# Patient Record
Sex: Female | Born: 1960 | Race: Black or African American | Hispanic: No | Marital: Single | Smoking: Never smoker
Health system: Southern US, Community
[De-identification: ages and names within clinical notes are randomized; demographics above are authoritative.]

## PROBLEM LIST (undated history)

## (undated) DIAGNOSIS — T7840XA Allergy, unspecified, initial encounter: Secondary | ICD-10-CM

## (undated) DIAGNOSIS — L732 Hidradenitis suppurativa: Secondary | ICD-10-CM

## (undated) DIAGNOSIS — R748 Abnormal levels of other serum enzymes: Secondary | ICD-10-CM

## (undated) DIAGNOSIS — C801 Malignant (primary) neoplasm, unspecified: Secondary | ICD-10-CM

## (undated) DIAGNOSIS — M1990 Unspecified osteoarthritis, unspecified site: Secondary | ICD-10-CM

## (undated) DIAGNOSIS — I493 Ventricular premature depolarization: Secondary | ICD-10-CM

## (undated) DIAGNOSIS — K760 Fatty (change of) liver, not elsewhere classified: Secondary | ICD-10-CM

## (undated) DIAGNOSIS — R002 Palpitations: Secondary | ICD-10-CM

## (undated) DIAGNOSIS — E785 Hyperlipidemia, unspecified: Secondary | ICD-10-CM

## (undated) DIAGNOSIS — E119 Type 2 diabetes mellitus without complications: Secondary | ICD-10-CM

## (undated) DIAGNOSIS — I10 Essential (primary) hypertension: Secondary | ICD-10-CM

## (undated) DIAGNOSIS — L0292 Furuncle, unspecified: Secondary | ICD-10-CM

## (undated) DIAGNOSIS — G43119 Migraine with aura, intractable, without status migrainosus: Secondary | ICD-10-CM

## (undated) HISTORY — DX: Ventricular premature depolarization: I49.3

## (undated) HISTORY — DX: Hyperlipidemia, unspecified: E78.5

## (undated) HISTORY — DX: Essential (primary) hypertension: I10

## (undated) HISTORY — DX: Allergy, unspecified, initial encounter: T78.40XA

## (undated) HISTORY — DX: Fatty (change of) liver, not elsewhere classified: K76.0

## (undated) HISTORY — DX: Hidradenitis suppurativa: L73.2

## (undated) HISTORY — DX: Type 2 diabetes mellitus without complications: E11.9

## (undated) HISTORY — DX: Migraine with aura, intractable, without status migrainosus: G43.119

## (undated) HISTORY — DX: Furuncle, unspecified: L02.92

## (undated) HISTORY — DX: Unspecified osteoarthritis, unspecified site: M19.90

## (undated) HISTORY — DX: Abnormal levels of other serum enzymes: R74.8

## (undated) HISTORY — DX: Palpitations: R00.2

## (undated) HISTORY — PX: BREAST CYST ASPIRATION: SHX578

---
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
## 1988-03-07 HISTORY — PX: ECTOPIC PREGNANCY SURGERY: SHX613

## 1997-04-17 ENCOUNTER — Ambulatory Visit (HOSPITAL_COMMUNITY): Admission: RE | Admit: 1997-04-17 | Discharge: 1997-04-17 | Payer: Self-pay | Admitting: Obstetrics and Gynecology

## 1998-03-11 ENCOUNTER — Other Ambulatory Visit: Admission: RE | Admit: 1998-03-11 | Discharge: 1998-03-11 | Payer: Self-pay | Admitting: Obstetrics and Gynecology

## 1999-03-22 ENCOUNTER — Other Ambulatory Visit: Admission: RE | Admit: 1999-03-22 | Discharge: 1999-03-22 | Payer: Self-pay | Admitting: Obstetrics & Gynecology

## 1999-07-12 ENCOUNTER — Encounter: Admission: RE | Admit: 1999-07-12 | Discharge: 1999-07-12 | Payer: Self-pay | Admitting: *Deleted

## 1999-07-12 ENCOUNTER — Encounter: Payer: Self-pay | Admitting: *Deleted

## 2000-04-17 ENCOUNTER — Other Ambulatory Visit: Admission: RE | Admit: 2000-04-17 | Discharge: 2000-04-17 | Payer: Self-pay | Admitting: Obstetrics & Gynecology

## 2000-04-24 ENCOUNTER — Encounter: Payer: Self-pay | Admitting: Obstetrics & Gynecology

## 2000-04-24 ENCOUNTER — Encounter: Admission: RE | Admit: 2000-04-24 | Discharge: 2000-04-24 | Payer: Self-pay | Admitting: Obstetrics & Gynecology

## 2000-12-28 ENCOUNTER — Encounter: Admission: RE | Admit: 2000-12-28 | Discharge: 2000-12-28 | Payer: Self-pay | Admitting: Obstetrics & Gynecology

## 2000-12-28 ENCOUNTER — Encounter: Payer: Self-pay | Admitting: Obstetrics & Gynecology

## 2001-03-23 ENCOUNTER — Other Ambulatory Visit: Admission: RE | Admit: 2001-03-23 | Discharge: 2001-03-23 | Payer: Self-pay | Admitting: Obstetrics & Gynecology

## 2001-05-04 ENCOUNTER — Encounter: Admission: RE | Admit: 2001-05-04 | Discharge: 2001-05-04 | Payer: Self-pay | Admitting: Obstetrics & Gynecology

## 2001-05-04 ENCOUNTER — Encounter: Payer: Self-pay | Admitting: Obstetrics & Gynecology

## 2001-06-01 ENCOUNTER — Encounter: Payer: Self-pay | Admitting: Internal Medicine

## 2001-06-01 ENCOUNTER — Encounter: Admission: RE | Admit: 2001-06-01 | Discharge: 2001-06-01 | Payer: Self-pay | Admitting: Internal Medicine

## 2001-06-04 ENCOUNTER — Encounter: Payer: Self-pay | Admitting: Internal Medicine

## 2001-06-04 ENCOUNTER — Encounter: Admission: RE | Admit: 2001-06-04 | Discharge: 2001-06-04 | Payer: Self-pay | Admitting: Internal Medicine

## 2002-03-29 ENCOUNTER — Other Ambulatory Visit: Admission: RE | Admit: 2002-03-29 | Discharge: 2002-03-29 | Payer: Self-pay | Admitting: Obstetrics & Gynecology

## 2002-04-19 ENCOUNTER — Encounter: Admission: RE | Admit: 2002-04-19 | Discharge: 2002-04-19 | Payer: Self-pay | Admitting: Obstetrics & Gynecology

## 2002-04-19 ENCOUNTER — Encounter: Payer: Self-pay | Admitting: Obstetrics & Gynecology

## 2002-09-26 ENCOUNTER — Encounter: Admission: RE | Admit: 2002-09-26 | Discharge: 2002-09-26 | Payer: Self-pay | Admitting: Internal Medicine

## 2003-04-18 ENCOUNTER — Other Ambulatory Visit: Admission: RE | Admit: 2003-04-18 | Discharge: 2003-04-18 | Payer: Self-pay | Admitting: Obstetrics & Gynecology

## 2004-02-06 ENCOUNTER — Encounter: Admission: RE | Admit: 2004-02-06 | Discharge: 2004-02-06 | Payer: Self-pay | Admitting: Obstetrics & Gynecology

## 2004-05-31 ENCOUNTER — Encounter: Admission: RE | Admit: 2004-05-31 | Discharge: 2004-05-31 | Payer: Self-pay | Admitting: Internal Medicine

## 2004-06-06 ENCOUNTER — Encounter: Admission: RE | Admit: 2004-06-06 | Discharge: 2004-06-06 | Payer: Self-pay | Admitting: Internal Medicine

## 2005-01-21 ENCOUNTER — Encounter: Admission: RE | Admit: 2005-01-21 | Discharge: 2005-03-06 | Payer: Self-pay | Admitting: Internal Medicine

## 2005-02-25 ENCOUNTER — Encounter: Admission: RE | Admit: 2005-02-25 | Discharge: 2005-02-25 | Payer: Self-pay | Admitting: Obstetrics & Gynecology

## 2006-03-10 ENCOUNTER — Encounter: Admission: RE | Admit: 2006-03-10 | Discharge: 2006-03-10 | Payer: Self-pay | Admitting: Obstetrics & Gynecology

## 2007-03-30 ENCOUNTER — Encounter: Admission: RE | Admit: 2007-03-30 | Discharge: 2007-03-30 | Payer: Self-pay | Admitting: Obstetrics & Gynecology

## 2007-04-13 ENCOUNTER — Encounter: Admission: RE | Admit: 2007-04-13 | Discharge: 2007-04-13 | Payer: Self-pay | Admitting: Obstetrics & Gynecology

## 2008-01-28 ENCOUNTER — Encounter: Admission: RE | Admit: 2008-01-28 | Discharge: 2008-01-28 | Payer: Self-pay | Admitting: Obstetrics & Gynecology

## 2008-03-11 ENCOUNTER — Encounter: Admission: RE | Admit: 2008-03-11 | Discharge: 2008-03-11 | Payer: Self-pay | Admitting: Obstetrics and Gynecology

## 2008-05-09 ENCOUNTER — Encounter: Admission: RE | Admit: 2008-05-09 | Discharge: 2008-05-09 | Payer: Self-pay | Admitting: Obstetrics & Gynecology

## 2009-05-15 ENCOUNTER — Encounter: Admission: RE | Admit: 2009-05-15 | Discharge: 2009-05-15 | Payer: Self-pay | Admitting: Obstetrics & Gynecology

## 2009-10-16 ENCOUNTER — Encounter: Admission: RE | Admit: 2009-10-16 | Discharge: 2009-10-16 | Payer: Self-pay | Admitting: Obstetrics & Gynecology

## 2011-05-27 ENCOUNTER — Ambulatory Visit: Payer: Self-pay | Admitting: Gynecology

## 2011-06-10 ENCOUNTER — Ambulatory Visit: Payer: Self-pay | Admitting: Gynecology

## 2011-06-24 ENCOUNTER — Other Ambulatory Visit: Payer: Self-pay | Admitting: Gynecology

## 2011-06-24 ENCOUNTER — Other Ambulatory Visit: Payer: Self-pay | Admitting: *Deleted

## 2011-06-24 DIAGNOSIS — N63 Unspecified lump in unspecified breast: Secondary | ICD-10-CM

## 2011-07-01 ENCOUNTER — Ambulatory Visit: Payer: Self-pay | Admitting: Gynecology

## 2011-07-08 ENCOUNTER — Ambulatory Visit: Payer: Self-pay | Admitting: Gynecology

## 2011-08-26 ENCOUNTER — Encounter: Payer: Self-pay | Admitting: Gastroenterology

## 2011-10-18 ENCOUNTER — Other Ambulatory Visit: Payer: Self-pay | Admitting: Gastroenterology

## 2012-03-07 HISTORY — PX: COLONOSCOPY: SHX5424

## 2012-05-01 ENCOUNTER — Encounter: Payer: Self-pay | Admitting: Gastroenterology

## 2012-06-01 ENCOUNTER — Ambulatory Visit (AMBULATORY_SURGERY_CENTER): Payer: BC Managed Care – PPO | Admitting: *Deleted

## 2012-06-01 ENCOUNTER — Encounter: Payer: Self-pay | Admitting: Gastroenterology

## 2012-06-01 VITALS — Ht 62.0 in | Wt 172.6 lb

## 2012-06-01 DIAGNOSIS — Z1211 Encounter for screening for malignant neoplasm of colon: Secondary | ICD-10-CM

## 2012-06-01 MED ORDER — MOVIPREP 100 G PO SOLR: ORAL | Status: DC

## 2012-06-15 ENCOUNTER — Other Ambulatory Visit: Payer: Self-pay | Admitting: Gastroenterology

## 2012-06-15 ENCOUNTER — Encounter: Payer: Self-pay | Admitting: Gastroenterology

## 2012-06-15 ENCOUNTER — Ambulatory Visit (AMBULATORY_SURGERY_CENTER): Payer: BC Managed Care – PPO | Admitting: Gastroenterology

## 2012-06-15 VITALS — BP 119/74 | HR 70 | Temp 99.2°F | Resp 19 | Ht 62.0 in | Wt 172.0 lb

## 2012-06-15 DIAGNOSIS — Z1211 Encounter for screening for malignant neoplasm of colon: Secondary | ICD-10-CM

## 2012-06-15 DIAGNOSIS — K5730 Diverticulosis of large intestine without perforation or abscess without bleeding: Secondary | ICD-10-CM

## 2012-06-15 MED ORDER — SODIUM CHLORIDE 0.9 % IV SOLN: 500.0000 mL | INTRAVENOUS | Status: DC

## 2012-06-15 NOTE — Progress Notes (Signed)
Report to pacu rn, vss, bbs=clear 

## 2012-06-15 NOTE — Op Note (Signed)
[facility]  [address]. [place], [address]   COLONOSCOPY PROCEDURE REPORT  PATIENT: Vicki, [name]  MR#: [id number] BIRTHDATE: 1960/10/19 , 46  yrs. old GENDER: Female ENDOSCOPIST: Sable Feil, MD, Marval Regal REFERRED BY:  Tommy Medal, M.D. PROCEDURE DATE:  06/15/2012 PROCEDURE:   Colonoscopy, screening ASA CLASS:   Class II INDICATIONS:Average risk patient for colon cancer. MEDICATIONS: propofol (Diprivan) 134m IV  DESCRIPTION OF PROCEDURE:   After the risks and benefits and of the procedure were explained, informed consent was obtained.  A digital rectal exam revealed no abnormalities of the rectum.    The LB [id number] endoscope was introduced through the anus and advanced to the cecum, which was identified by both the appendix and ileocecal valve .  The quality of the prep was excellent, using MoviPrep .  The instrument was then slowly withdrawn as the colon was fully examined.     COLON FINDINGS: Mild diverticulosis was noted in the sigmoid colon. The colon was otherwise normal.  There was no diverticulosis, inflammation, polyps or cancers unless previously stated. Retroflexed views revealed no abnormalities except a prominant papilla in perianal area.     The scope was then withdrawn from the patient and the procedure completed.  COMPLICATIONS: There were no complications. ENDOSCOPIC IMPRESSION: 1.   Mild diverticulosis was noted in the sigmoid colon 2.   The colon was otherwise normal ..no polyps noted.  RECOMMENDATIONS: 1.  High fiber diet 2.  Continue current colorectal screening recommendations for "routine risk" patients with a repeat colonoscopy in 10 years.   REPEAT EXAM:  cc:  _______________________________ eSigned:Sable Feil MD, FBapt[facility]04/01/2013 10:25 AM

## 2012-06-15 NOTE — Progress Notes (Signed)
Patient did not experience any of the following events: a burn prior to discharge; a fall within the facility; wrong site/side/patient/procedure/implant event; or a hospital transfer or hospital admission upon discharge from the facility. (G8907) Patient did not have preoperative order for IV antibiotic SSI prophylaxis. (G8918)  

## 2012-06-15 NOTE — Patient Instructions (Addendum)

## 2012-06-18 ENCOUNTER — Telehealth: Payer: Self-pay | Admitting: *Deleted

## 2012-06-18 ENCOUNTER — Telehealth: Payer: Self-pay | Admitting: Gastroenterology

## 2012-06-18 NOTE — Telephone Encounter (Signed)
Pt. Wanted to know if it would be OK to use desitin on her anal area for irritation/skin tag.  Advised that if she had discomfort she could use, if not no need to use.

## 2012-06-18 NOTE — Telephone Encounter (Signed)
No answer, left message to call if questions or concerns. 

## 2012-06-29 ENCOUNTER — Ambulatory Visit: Payer: Self-pay | Admitting: Podiatry

## 2012-07-06 ENCOUNTER — Ambulatory Visit: Payer: Self-pay | Admitting: Podiatry

## 2012-07-13 ENCOUNTER — Ambulatory Visit: Payer: Self-pay | Admitting: Podiatry

## 2012-08-31 ENCOUNTER — Ambulatory Visit (INDEPENDENT_AMBULATORY_CARE_PROVIDER_SITE_OTHER): Payer: Federal, State, Local not specified - PPO | Admitting: Podiatry

## 2012-08-31 ENCOUNTER — Encounter: Payer: Self-pay | Admitting: Podiatry

## 2012-08-31 VITALS — BP 127/79 | HR 71 | Wt 170.0 lb

## 2012-08-31 DIAGNOSIS — M25571 Pain in right ankle and joints of right foot: Secondary | ICD-10-CM

## 2012-08-31 DIAGNOSIS — M722 Plantar fascial fibromatosis: Secondary | ICD-10-CM | POA: Insufficient documentation

## 2012-08-31 DIAGNOSIS — M25579 Pain in unspecified ankle and joints of unspecified foot: Secondary | ICD-10-CM | POA: Insufficient documentation

## 2012-08-31 NOTE — Progress Notes (Signed)
Subjective: 52 year old NIDDM female presents complaining of a knot on bottom of right foot for about a month. It gets tender at times. She works on her feet and stands about 8-10 hours a day. Blood sugar under control. 113. She has a pair of diabetic shoes with soft insole.    Objective: Right ankle equinus with forefoot abductus. With knee extended and flexed. Palpable knot mid arch plantar fascial band about a dime size. No edema or erythema, no color change noted. All pedal pulses are palpable.   Assessment: Plantar fibroma right mid arch. Pain on right foot. NIDDM.  Plan: Stretch exercise right Achilles tendon. Reviewed types of exercise.  May benefit from a new pair of Orthotics, functional with hard shell. Patient will return for Orthotics.

## 2012-10-05 ENCOUNTER — Ambulatory Visit (INDEPENDENT_AMBULATORY_CARE_PROVIDER_SITE_OTHER): Payer: Federal, State, Local not specified - PPO | Admitting: Podiatry

## 2012-10-05 DIAGNOSIS — M722 Plantar fascial fibromatosis: Secondary | ICD-10-CM

## 2012-10-05 DIAGNOSIS — M25579 Pain in unspecified ankle and joints of unspecified foot: Secondary | ICD-10-CM

## 2012-10-05 NOTE — Progress Notes (Signed)
Came in for Orthotics. Both feet X-ray done and casted for Orthotics.  Offered Cortisone injection. Patient will wait for another time.  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  No change in condition since last visit. Objective:  Right ankle equinus with forefoot abductus. With knee extended and flexed.  Palpable knot mid arch plantar fascial band about a dime size.  No edema or erythema, no color change noted.  All pedal pulses are palpable.  X-rays of both feet done. There was no acute changes noted.  Lateral view reveal significant anterior advancement of Talar head with lowered arch on L>R.   Assessment: Plantar fibroma right mid arch.  Pain on right foot.  NIDDM.   Plan: Stretch exercise right Achilles tendon. Reviewed types of exercise.  X-rays done and both feet are casted for Orthotics.

## 2012-10-16 NOTE — Telephone Encounter (Signed)
See Previous Encounter

## 2012-12-12 ENCOUNTER — Ambulatory Visit: Payer: Federal, State, Local not specified - PPO | Admitting: Podiatry

## 2012-12-14 ENCOUNTER — Ambulatory Visit: Payer: Federal, State, Local not specified - PPO | Admitting: Podiatry

## 2012-12-15 ENCOUNTER — Emergency Department (HOSPITAL_COMMUNITY)
Admission: EM | Admit: 2012-12-15 | Discharge: 2012-12-15 | Disposition: A | Discharge status: Home / Self Care | Payer: BC Managed Care – PPO | Source: Home / Self Care | Attending: Emergency Medicine | Admitting: Emergency Medicine

## 2012-12-15 ENCOUNTER — Encounter (HOSPITAL_COMMUNITY): Payer: Self-pay | Admitting: Emergency Medicine

## 2012-12-15 DIAGNOSIS — H109 Unspecified conjunctivitis: Secondary | ICD-10-CM

## 2012-12-15 MED ORDER — TETRACAINE HCL 0.5 % OP SOLN
OPHTHALMIC | Status: AC
  Filled 2012-12-15: qty 2

## 2012-12-15 MED ORDER — CIPROFLOXACIN HCL 0.3 % OP SOLN: OPHTHALMIC | Status: DC

## 2012-12-15 NOTE — ED Provider Notes (Signed)
Chief Complaint:   Chief Complaint  Patient presents with  . Eye Pain    History of Present Illness:   Vicki [name] is a 52 year old female who has had an 8-9 day history of redness, pain, itching, irritation, discharge, and matting of both eyes. She denies any blurry vision or photophobia. She's had some pain in her nasal area, nasal congestion, scratchy throat, and headache. She denies any fever or chills. No known sick exposures.  Review of Systems:  Other than noted above, the patient denies any of the following symptoms: Systemic:  No fever, chills, sweats, fatigue, or weight loss. Eye:  No redness, eye pain, photophobia, discharge, blurred vision, or diplopia. ENT:  No nasal congestion, rhinorrhea, or sore throat. Lymphatic:  No adenopathy. Skin:  No rash or pruritis.  PMFSH:  Past medical history, family history, social history, meds, and allergies were reviewed.  She is allergic to sulfa. She has high blood pressure and diabetes. She takes Benicar and metformin.  Physical Exam:   Vital signs:  BP 117/71  Pulse 79  Temp(Src) 98.4 F (36.9 C) (Oral)  SpO2 98%  LMP 04/30/2012 General:  Alert and in no distress. Eye:  Her lids appear normal. Conjunctivas were injected bilaterally, left more so than right. There was no discharge or purulent drainage present. Corneas were intact to fluorescein staining, anterior chamber was normal, fundi were benign, PERRLA, full EOMs. ENT:  TMs and canals clear.  Nasal mucosa normal.  No intra-oral lesions, mucous membranes moist, pharynx clear. Neck:  No adenopathy tenderness or mass. Skin:  Clear, warm and dry.  Assessment:  The encounter diagnosis was Conjunctivitis.  Probably bacterial.  Plan:   1.  Meds:  The following meds were prescribed:   Discharge Medication List as of 12/15/2012  3:25 PM    START taking these medications   Details  ciprofloxacin (CILOXAN) 0.3 % ophthalmic solution 1 drop in both eyes every 3 hours while awake,  Normal        2.  Patient Education/Counseling:  The patient was given appropriate handouts, self care instructions, and instructed in symptomatic relief.  Advised warm compresses.  3.  Follow up:  The patient was told to follow up if no better in 3 to 4 days, if becoming worse in any way, and given some red flag symptoms such as any changes in her vision which would prompt immediate return.  Follow up with her eye doctor if no better in 3 or 4 days.      Reuben Likes, MD 12/15/12 [phone]

## 2012-12-15 NOTE — ED Notes (Signed)
C/o left eye drainage and pain.  Denies injury to eye.

## 2012-12-21 ENCOUNTER — Ambulatory Visit: Payer: Federal, State, Local not specified - PPO | Admitting: Podiatry

## 2013-02-15 ENCOUNTER — Other Ambulatory Visit: Payer: Self-pay | Admitting: Internal Medicine

## 2013-02-15 DIAGNOSIS — R7989 Other specified abnormal findings of blood chemistry: Secondary | ICD-10-CM

## 2013-02-22 ENCOUNTER — Ambulatory Visit
Admission: RE | Admit: 2013-02-22 | Discharge: 2013-02-22 | Disposition: A | Discharge status: Home / Self Care | Payer: Federal, State, Local not specified - PPO | Source: Ambulatory Visit | Attending: Internal Medicine | Admitting: Internal Medicine

## 2013-02-22 DIAGNOSIS — R7989 Other specified abnormal findings of blood chemistry: Secondary | ICD-10-CM

## 2013-07-31 ENCOUNTER — Ambulatory Visit (INDEPENDENT_AMBULATORY_CARE_PROVIDER_SITE_OTHER): Payer: Federal, State, Local not specified - PPO | Admitting: Podiatry

## 2013-07-31 ENCOUNTER — Encounter: Payer: Self-pay | Admitting: Podiatry

## 2013-07-31 VITALS — BP 119/80 | HR 75 | Ht 62.0 in | Wt 169.0 lb

## 2013-07-31 DIAGNOSIS — L02619 Cutaneous abscess of unspecified foot: Secondary | ICD-10-CM

## 2013-07-31 DIAGNOSIS — S90829A Blister (nonthermal), unspecified foot, initial encounter: Secondary | ICD-10-CM

## 2013-07-31 DIAGNOSIS — B353 Tinea pedis: Secondary | ICD-10-CM | POA: Insufficient documentation

## 2013-07-31 DIAGNOSIS — L089 Local infection of the skin and subcutaneous tissue, unspecified: Secondary | ICD-10-CM | POA: Insufficient documentation

## 2013-07-31 DIAGNOSIS — S90426A Blister (nonthermal), unspecified lesser toe(s), initial encounter: Secondary | ICD-10-CM

## 2013-07-31 DIAGNOSIS — L03119 Cellulitis of unspecified part of limb: Secondary | ICD-10-CM

## 2013-07-31 DIAGNOSIS — M79609 Pain in unspecified limb: Secondary | ICD-10-CM

## 2013-07-31 MED ORDER — TERBINAFINE HCL 250 MG PO TABS: 250.0000 mg | ORAL_TABLET | Freq: Every day | ORAL | Status: DC

## 2013-07-31 NOTE — Progress Notes (Signed)
Subjective: 53 year old female presents complaining of a mass on left heel that is painful to walk.   Objective: Fluid filled lesion on left heel, 1cm diameter.  Surrounded with dry scaly skin and hard callused skin.   Assessment: Tinea pedis with blister formation left heel.  Plan: Lanced the lesion and fluid drained. Cleansed with Betadine solution and dressing applied. Patient is to soak in Vinegar water daily x 7 days and take Lamisil for 3 weeks.  Return if condition gets worse.

## 2013-07-31 NOTE — Patient Instructions (Signed)
Seen for a lesion on left heel.  Noted of blister from fungal infection. Take prescribed medication and soak daily in diluted Vinegar water for one week. Return if the condition gets worse.

## 2013-08-05 ENCOUNTER — Ambulatory Visit: Payer: Federal, State, Local not specified - PPO | Admitting: Podiatry

## 2013-11-26 ENCOUNTER — Other Ambulatory Visit: Payer: Self-pay | Admitting: Internal Medicine

## 2013-11-26 DIAGNOSIS — K760 Fatty (change of) liver, not elsewhere classified: Secondary | ICD-10-CM

## 2013-12-09 ENCOUNTER — Ambulatory Visit
Admission: RE | Admit: 2013-12-09 | Discharge: 2013-12-09 | Disposition: A | Discharge status: Home / Self Care | Payer: Federal, State, Local not specified - PPO | Source: Ambulatory Visit | Attending: Internal Medicine | Admitting: Internal Medicine

## 2013-12-09 DIAGNOSIS — K760 Fatty (change of) liver, not elsewhere classified: Secondary | ICD-10-CM

## 2014-03-03 ENCOUNTER — Ambulatory Visit: Payer: Federal, State, Local not specified - PPO | Admitting: Internal Medicine

## 2014-03-31 ENCOUNTER — Encounter: Payer: Self-pay | Admitting: Internal Medicine

## 2014-03-31 ENCOUNTER — Ambulatory Visit (INDEPENDENT_AMBULATORY_CARE_PROVIDER_SITE_OTHER): Payer: Federal, State, Local not specified - PPO | Admitting: Internal Medicine

## 2014-03-31 VITALS — BP 122/68 | HR 80 | Temp 98.4°F | Resp 12 | Ht 62.0 in | Wt 170.0 lb

## 2014-03-31 DIAGNOSIS — E118 Type 2 diabetes mellitus with unspecified complications: Secondary | ICD-10-CM | POA: Insufficient documentation

## 2014-03-31 DIAGNOSIS — E119 Type 2 diabetes mellitus without complications: Secondary | ICD-10-CM | POA: Insufficient documentation

## 2014-03-31 DIAGNOSIS — R748 Abnormal levels of other serum enzymes: Secondary | ICD-10-CM | POA: Insufficient documentation

## 2014-03-31 LAB — HEMOGLOBIN A1C: Hgb A1c MFr Bld: 7.4 % — ABNORMAL HIGH (ref 4.6–6.5)

## 2014-03-31 NOTE — Progress Notes (Addendum)
Patient ID: Vicki [name], female   DOB: 12/12/1960, 54 y.o.   MRN: [id number]  HPI: Vicki [name] is a 54 y.o.-year-old female, referred by her PCP, Dr. Minna Antis, for management of DM2, dx in 2005, non-insulin-dependent, controlled, without complications.  Hemoglobin A1c levels were at or close to target in the past: 11/18/2013: 6.4% 08/09/2013: 7.6% 01/01/2013: 6.8% 04/27/2012: 6.6% 12/15/2011: 5.9% 07/22/2011: 6.4%  Pt is on a regimen of: - Metformin ER 500 mg po bid >> now once a day - in last 4 mo She tried Januvia but developed chest pain.  Pt was checking  her sugars once a day, but not recently. - am: <140 No lows. Lowest sugar was 60; ? If she has she has hypoglycemia awareness. Highest sugar was 140  Glucometer: OneTouch Ultra  Pt's meals are: - Breakfast: skips or oatmeal; eggs, grits, toast + saudage - Lunch: hamburger or chicken sandwich + fries + sodas - Dinner: chicken, potatoes and greens - Snacks: 2-3  - no CKD, last BUN/creatinine:  11/18/2013: BUN 12, creatinine 0.9, glucose 103 She is on Benicar 40 mg. - last set of lipids: 11/18/2013: 188/35/66/114 - last eye exam was in 03/2014. No DR.  - no numbness and tingling in her feet. She had a fungal inf.   Last TSH was 0.610 on 11/06/2013. She has a h/o goiter.  Pt has FH of DM in mother.  Patient also has a history of hypertension, elevated transaminases. Last LFTs: 11/21/2013: AST 79, ALT 130. GGT was normal at 46 (0-60). 04/05/2013: AST 59, ALT 81 02/08/2013: AST 81, ALT 100 01/01/2013: AST 88, ALT 123  ROS: Constitutional: + weight gain, + fatigue, + hot flushes, + increased urination Eyes: + blurry vision, no xerophthalmia ENT: + sore throat, no nodules palpated in throat, no dysphagia/odynophagia, no hoarseness, + tinnitus Cardiovascular: no CP/SOB/+ palpitations/leg swelling Respiratory: + cough/no SOB Gastrointestinal: + all: N/V/D/C/heartburn Musculoskeletal: no muscle/joint aches Skin: + rash,  no easy bruising, no itching` Neurological: no tremors/numbness/tingling/dizziness Psychiatric: no depression/anxiety + low libido  Past Medical History  Diagnosis Date  . Diabetes   . Hypertension   . Allergy   . Boil    Past Surgical History  Procedure Laterality Date  . Tonsillectomy  1980  . Ectopic pregnancy surgery  1990   History   Social History  . Marital Status: Single    Spouse Name: N/A    Number of Children: 1   Occupational History  .    Social History Main Topics  . Smoking status: Never Smoker   . Smokeless tobacco: Never Used  . Alcohol Use: 1.8 oz/week    3 Cans of beer per week  . Drug Use: No   Current Outpatient Prescriptions on File Prior to Visit  Medication Sig Dispense Refill  . acetaminophen (TYLENOL) 500 MG tablet Take 500 mg by mouth as needed for pain.    [name] BENICAR HCT 40-25 MG per tablet Take by mouth daily. Half tab daily    . Calcium Carbonate-Simethicone (MAALOX MAX PO) Take by mouth as needed.    . Cetirizine HCl (ZYRTEC PO) Take by mouth as needed.    . cholecalciferol (VITAMIN D) 1000 UNITS tablet Take 2,000 Units by mouth daily.     . metFORMIN (GLUCOPHAGE) 500 MG tablet Take 500 mg by mouth 2 (two) times daily with a meal.    . ONE TOUCH ULTRA TEST test strip     . meloxicam (MOBIC) 7.5 MG tablet Take 7.5 mg by mouth  as needed for pain.    [name] terbinafine (LAMISIL) 250 MG tablet Take 1 tablet (250 mg total) by mouth daily. (Patient not taking: Reported on 03/31/2014) 21 tablet 0  . zolpidem (AMBIEN) 10 MG tablet Take 10 mg by mouth at bedtime as needed for sleep.     No current facility-administered medications on file prior to visit.   Allergies  Allergen Reactions  . Sulfa Antibiotics Itching   Family History  Problem Relation Age of Onset  . Colon cancer Neg Hx    PE: BP 122/68 mmHg  Pulse 80  Temp(Src) 98.4 F (36.9 C) (Oral)  Resp 12  Ht 5\' 2"  (1.575 m)  Wt 170 lb (77.111 kg)  BMI 31.09 kg/m2  SpO2 97%  LMP  04/30/2012 Wt Readings from Last 3 Encounters:  03/31/14 170 lb (77.111 kg)  07/31/13 169 lb (76.658 kg)  08/31/12 170 lb (77.111 kg)   Constitutional: overweight, in NAD Eyes: PERRLA, EOMI, no exophthalmos ENT: moist mucous membranes, slight thyromegaly, no cervical lymphadenopathy Cardiovascular: RRR, No MRG Respiratory: CTA B Gastrointestinal: abdomen soft, NT, ND, BS+ Musculoskeletal: no deformities, strength intact in all 4 Skin: moist, warm, no rashes Neurological: no tremor with outstretched hands, DTR normal in all 4  ASSESSMENT: 1. DM2, non-insulin-dependent, controlled, without complications  2. Transaminitis  PLAN:  1. Patient with long-standing, controlled diabetes, on oral antidiabetic regimen, with good control. However, we do not have a recent HbA1c and she is not checking sugars either for the last 4 months. She also decreased the Metformin to only 1x a day. I advised her to start checking sugars, will check hbA1c now and also, we will check hbA1c today. - We discussed about options for treatment, and I suggested to:  Patient Instructions  Please increase the Metformin XR 500 mg 2x a day.  Please stop at the lab.  Please return in 3 months with your sugar log.   Please cut down: fried foods, sodas, alcohol to improve your liver enzymes.  - Strongly advised her to start checking sugars at different times of the day - check 1x time a day, rotating checks - given sugar log and advised how to fill it and to bring it at next appt  - given foot care handout and explained the principles  - given instructions for hypoglycemia management "15-15 rule"  - advised for yearly eye exams >> she is UTD - Return to clinic in 3 mo with sugar log   2. Transaminitis - she has had chronic increased LFTs - discussed about changing in diet >> stop fried foods, sodas and alcohol  Office Visit on 03/31/2014  Component Date Value Ref Range Status  . Hgb A1c MFr Bld 03/31/2014 7.4*  4.6 - 6.5 % Final   Glycemic Control Guidelines for People with Diabetes:Non Diabetic:  <6%Goal of Therapy: <7%Additional Action Suggested:  >8%   Hemoglobin A1c has increased,  We will increase the metformin back to 500 mg twice a day and after last visit we might need to increase to 1000 mg twice a day. We also discussed about improving her diet, which will greatly help, also.

## 2014-03-31 NOTE — Patient Instructions (Signed)
Please increase the Metformin XR 500 mg 2x a day.  Please stop at the lab.  Please return in 3 months with your sugar log.   Please cut down: fried foods, sodas, alcohol to improve your liver enzymes.  PATIENT INSTRUCTIONS FOR TYPE 2 DIABETES:  **Please join MyChart!** - see attached instructions about how to join if you have not done so already.  DIET AND EXERCISE Diet and exercise is an important part of diabetic treatment.  We recommended aerobic exercise in the form of brisk walking (working between 40-60% of maximal aerobic capacity, similar to brisk walking) for 150 minutes per week (such as 30 minutes five days per week) along with 3 times per week performing 'resistance' training (using various gauge rubber tubes with handles) 5-10 exercises involving the major muscle groups (upper body, lower body and core) performing 10-15 repetitions (or near fatigue) each exercise. Start at half the above goal but build slowly to reach the above goals. If limited by weight, joint pain, or disability, we recommend daily walking in a swimming pool with water up to waist to reduce pressure from joints while allow for adequate exercise.    BLOOD GLUCOSES Monitoring your blood glucoses is important for continued management of your diabetes. Please check your blood glucoses 2-4 times a day: fasting, before meals and at bedtime (you can rotate these measurements - e.g. one day check before the 3 meals, the next day check before 2 of the meals and before bedtime, etc.).   HYPOGLYCEMIA (low blood sugar) Hypoglycemia is usually a reaction to not eating, exercising, or taking too much insulin/ other diabetes drugs.  Symptoms include tremors, sweating, hunger, confusion, headache, etc. Treat IMMEDIATELY with 15 grams of Carbs: . 4 glucose tablets .  cup regular juice/soda . 2 tablespoons raisins . 4 teaspoons sugar . 1 tablespoon honey Recheck blood glucose in 15 mins and repeat above if still  symptomatic/blood glucose <100.  RECOMMENDATIONS TO REDUCE YOUR RISK OF DIABETIC COMPLICATIONS: * Take your prescribed MEDICATION(S) * Follow a DIABETIC diet: Complex carbs, fiber rich foods, (monounsaturated and polyunsaturated) fats * AVOID saturated/trans fats, high fat foods, >2,300 mg salt per day. * EXERCISE at least 5 times a week for 30 minutes or preferably daily.  * DO NOT SMOKE OR DRINK more than 1 drink a day. * Check your FEET every day. Do not wear tightfitting shoes. Contact us if you develop an ulcer * See your EYE doctor once a year or more if needed * Get a FLU shot once a year * Get a PNEUMONIA vaccine once before and once after age 53 years  GOALS:  * Your Hemoglobin A1c of <7%  * fasting sugars need to be <130 * after meals sugars need to be <180 (2h after you start eating) * Your Systolic BP should be 428 or lower  * Your Diastolic BP should be 80 or lower  * Your HDL (Good Cholesterol) should be 40 or higher  * Your LDL (Bad Cholesterol) should be 100 or lower. * Your Triglycerides should be 150 or lower  * Your Urine microalbumin (kidney function) should be <30 * Your Body Mass Index should be 25 or lower    Please consider the following ways to cut down carbs and fat and increase fiber and micronutrients in your diet: - substitute whole grain for white bread or pasta - substitute brown rice for white rice - substitute 90-calorie flat bread pieces for slices of bread when possible - substitute  sweet potatoes or yams for white potatoes - substitute humus for margarine - substitute tofu for cheese when possible - substitute almond or rice milk for regular milk (would not drink soy milk daily due to concern for soy estrogen influence on breast cancer risk) - substitute dark chocolate for other sweets when possible - substitute water - can add lemon or orange slices for taste - for diet sodas (artificial sweeteners will trick your body that you can eat sweets  without getting calories and will lead you to overeating and weight gain in the long run) - do not skip breakfast or other meals (this will slow down the metabolism and will result in more weight gain over time)  - can try smoothies made from fruit and almond/rice milk in am instead of regular breakfast - can also try old-fashioned (not instant) oatmeal made with almond/rice milk in am - order the dressing on the side when eating salad at a restaurant (pour less than half of the dressing on the salad) - eat as little meat as possible - can try juicing, but should not forget that juicing will get rid of the fiber, so would alternate with eating raw veg./fruits or drinking smoothies - use as little oil as possible, even when using olive oil - can dress a salad with a mix of balsamic vinegar and lemon juice, for e.g. - use agave nectar, stevia sugar, or regular sugar rather than artificial sweateners - steam or broil/roast veggies  - snack on veggies/fruit/nuts (unsalted, preferably) when possible, rather than processed foods - reduce or eliminate aspartame in diet (it is in diet sodas, chewing gum, etc) Read the labels!  Try to read Dr. Janene Harvey book: "Program for Reversing Diabetes" for other ideas for healthy eating.

## 2014-03-31 NOTE — Addendum Note (Signed)
Addended by: Philemon Kingdom on: 03/31/2014 05:19 PM   Modules accepted: Level of Service

## 2014-04-03 ENCOUNTER — Encounter: Payer: Self-pay | Admitting: *Deleted

## 2014-07-04 ENCOUNTER — Ambulatory Visit: Payer: Federal, State, Local not specified - PPO | Admitting: Internal Medicine

## 2014-07-04 DIAGNOSIS — Z0289 Encounter for other administrative examinations: Secondary | ICD-10-CM

## 2014-07-07 ENCOUNTER — Ambulatory Visit: Payer: Federal, State, Local not specified - PPO | Admitting: Internal Medicine

## 2014-07-22 ENCOUNTER — Emergency Department (INDEPENDENT_AMBULATORY_CARE_PROVIDER_SITE_OTHER)
Admission: EM | Admit: 2014-07-22 | Discharge: 2014-07-22 | Disposition: A | Discharge status: Home / Self Care | Payer: Federal, State, Local not specified - PPO | Source: Home / Self Care | Attending: Family Medicine | Admitting: Family Medicine

## 2014-07-22 ENCOUNTER — Encounter (HOSPITAL_COMMUNITY): Payer: Self-pay | Admitting: *Deleted

## 2014-07-22 DIAGNOSIS — B009 Herpesviral infection, unspecified: Secondary | ICD-10-CM

## 2014-07-22 MED ORDER — ACYCLOVIR 5 % EX OINT: 1.0000 "application " | TOPICAL_OINTMENT | CUTANEOUS | Status: DC

## 2014-07-22 NOTE — ED Provider Notes (Signed)
CSN: [id number]     Arrival date & time 07/22/14  1717 History   First MD Initiated Contact with Patient 07/22/14 1831     Chief Complaint  Patient presents with  . Mouth Lesions   (Consider location/radiation/quality/duration/timing/severity/associated sxs/prior Treatment) Patient is a 54 y.o. female presenting with mouth sores. The history is provided by the patient.  Mouth Lesions Location:  Upper lip Upper lip location:  R outer Quality:  Blistered and painful Pain details:    Quality:  Dull and sore   Severity:  Mild   Duration:  2 days Onset quality:  Unable to specify Chronicity:  New Context: possible infection and stress   Worsened by:  Nothing tried Ineffective treatments:  None tried Associated symptoms: no fever and no rhinorrhea     Past Medical History  Diagnosis Date  . Diabetes   . Hypertension   . Allergy   . Boil    Past Surgical History  Procedure Laterality Date  . Tonsillectomy  1980  . Ectopic pregnancy surgery  1990   Family History  Problem Relation Age of Onset  . Colon cancer Neg Hx    History  Substance Use Topics  . Smoking status: Never Smoker   . Smokeless tobacco: Never Used  . Alcohol Use: 1.8 oz/week    3 Cans of beer per week   OB History    No data available     Review of Systems  Constitutional: Negative.  Negative for fever.  HENT: Positive for mouth sores. Negative for rhinorrhea.     Allergies  Sulfa antibiotics  Home Medications   Prior to Admission medications   Medication Sig Start Date End Date Taking? Authorizing Provider  acetaminophen (TYLENOL) 500 MG tablet Take 500 mg by mouth as needed for pain.    Historical Provider, MD  acyclovir ointment (ZOVIRAX) 5 % Apply 1 application topically every 3 (three) hours. 07/22/14   Billy Fischer, MD  BENICAR HCT 40-25 MG per tablet Take by mouth daily. Half tab daily 09/02/12   Historical Provider, MD  Calcium Carbonate-Simethicone (MAALOX MAX PO) Take by mouth as  needed.    Historical Provider, MD  Cetirizine HCl (ZYRTEC PO) Take by mouth as needed.    Historical Provider, MD  cholecalciferol (VITAMIN D) 1000 UNITS tablet Take 2,000 Units by mouth daily.     Historical Provider, MD  Garlic 2876 MG CAPS Take 1 capsule by mouth daily.    Historical Provider, MD  meloxicam (MOBIC) 7.5 MG tablet Take 7.5 mg by mouth as needed for pain.    Historical Provider, MD  metFORMIN (GLUCOPHAGE) 500 MG tablet Take 500 mg by mouth 2 (two) times daily with a meal.    Historical Provider, MD  ONE TOUCH ULTRA TEST test strip  05/14/12   Historical Provider, MD  terbinafine (LAMISIL) 250 MG tablet Take 1 tablet (250 mg total) by mouth daily. Patient not taking: Reported on 03/31/2014 07/31/13   Myeong O Sheard, DPM  zolpidem (AMBIEN) 10 MG tablet Take 10 mg by mouth at bedtime as needed for sleep.    Historical Provider, MD   BP 119/67 mmHg  Pulse 75  Temp(Src) 98.8 F (37.1 C) (Oral)  Resp 18  SpO2 99%  LMP 04/30/2012 Physical Exam  Constitutional: She is oriented to person, place, and time. She appears well-developed and well-nourished.  HENT:  Right Ear: External ear normal.  Left Ear: External ear normal.  Mouth/Throat: Oropharynx is clear and moist.  63mm  sl tender smooth lesion to right lower lip, no adenopathy.  Eyes: Conjunctivae are normal. Pupils are equal, round, and reactive to light.  Neck: Normal range of motion. Neck supple.  Neurological: She is alert and oriented to person, place, and time.  Skin: Skin is warm and dry.  Nursing note and vitals reviewed.   ED Course  Procedures (including critical care time) Labs Review Labs Reviewed - No data to display  Imaging Review No results found.   MDM   1. Herpetic lesion       Billy Fischer, MD 07/22/14 [phone]

## 2014-07-22 NOTE — ED Notes (Signed)
C/o bump with a white pimple on upper lip onset Sunday.  C/o tenderness around it.

## 2014-07-25 ENCOUNTER — Encounter: Payer: Self-pay | Admitting: Internal Medicine

## 2014-07-25 ENCOUNTER — Ambulatory Visit (INDEPENDENT_AMBULATORY_CARE_PROVIDER_SITE_OTHER): Payer: Federal, State, Local not specified - PPO | Admitting: Internal Medicine

## 2014-07-25 VITALS — BP 118/80 | HR 83 | Temp 98.0°F | Resp 16 | Wt 169.0 lb

## 2014-07-25 DIAGNOSIS — E119 Type 2 diabetes mellitus without complications: Secondary | ICD-10-CM

## 2014-07-25 DIAGNOSIS — R748 Abnormal levels of other serum enzymes: Secondary | ICD-10-CM

## 2014-07-25 NOTE — Patient Instructions (Addendum)
Please continue Metformin XR 500 mg 2x a day with meals.  Please come back for a follow-up appointment in 3 months with your sugar log.

## 2014-07-25 NOTE — Progress Notes (Signed)
Patient ID: Vicki [name], female   DOB: [birth year], 54 y.o.   MRN: [id number]  HPI: Vicki [name] is a 54 y.o.-year-old female, returning for follow-up for DM2, dx in 2005, non-insulin-dependent, controlled, without complications. Last visit 4 months ago.  Hemoglobin A1c levels were reviewed:  05/2014: HbA1c <7% Lab Results  Component Value Date   HGBA1C 7.4* 03/31/2014  11/18/2013: 6.4% 08/09/2013: 7.6% 01/01/2013: 6.8% 04/27/2012: 6.6% 12/15/2011: 5.9% 07/22/2011: 6.4%  Pt is on a regimen of: - Metformin ER 500 mg po bid - increased at last visit. She tried Januvia but developed chest pain.  Pt was checking  her sugars once a day: - am: <140 >> 88-131, some 140s - before lunch: 111-114 - before dinner: 90-115 - Bedtime: 97-109 - noighttime: 106 No lows. Lowest sugar was 60 >>  88; ? If she has she has hypoglycemia awareness. Highest sugar was 140s  Glucometer: OneTouch Ultra  Pt's meals are: - Breakfast: skips or oatmeal; eggs, grits, toast + sausage - Lunch: hamburger or chicken sandwich + fries + sodas - Dinner: chicken, potatoes and greens - Snacks: 2-3  - no CKD, last BUN/creatinine:  11/18/2013: BUN 12, creatinine 0.9, glucose 103 She is on Benicar 40 mg. - last set of lipids: 11/18/2013: 188/35/66/114 - last eye exam was in 03/2014. No DR.  - no numbness and tingling in her feet. She had a fungal inf.   Will need new records from PCP.  Last TSH was 0.610 on 11/06/2013. She has a h/o goiter.  Patient also has a history of hypertension, elevated transaminases. Last LFTs: 11/21/2013: AST 79, ALT 130. GGT was normal at 46 (0-60). 04/05/2013: AST 59, ALT 81 02/08/2013: AST 81, ALT 100 01/01/2013: AST 88, ALT 123  ROS: Constitutional: no weight gain, + fatigue, + hot flushes, no increased urination Eyes: no blurry vision, no xerophthalmia ENT: + sore throat, no nodules palpated in throat, no dysphagia/odynophagia, no hoarseness Cardiovascular: no CP/SOB/+  palpitations/leg swelling Respiratory: + cough/no SOB Gastrointestinal: no N/V/D/C/heartburn Musculoskeletal: no muscle/joint aches Skin: + rash (cold sore), no easy bruising, no itching Neurological: no tremors/numbness/tingling/dizziness + low libido  I reviewed pt's medications, allergies, PMH, social hx, family hx, and changes were documented in the history of present illness. Otherwise, unchanged from my initial visit note.  Past Medical History  Diagnosis Date  . Diabetes   . Hypertension   . Allergy   . Boil    Past Surgical History  Procedure Laterality Date  . Tonsillectomy  1980  . Ectopic pregnancy surgery  1990   History   Social History  . Marital Status: Single    Spouse Name: N/A    Number of Children: 1   Occupational History  .    Social History Main Topics  . Smoking status: Never Smoker   . Smokeless tobacco: Never Used  . Alcohol Use: 1.8 oz/week    3 Cans of beer per week  . Drug Use: No   Current Outpatient Prescriptions on File Prior to Visit  Medication Sig Dispense Refill  . acetaminophen (TYLENOL) 500 MG tablet Take 500 mg by mouth as needed for pain.    [name] acyclovir ointment (ZOVIRAX) 5 % Apply 1 application topically every 3 (three) hours. 15 g 1  . BENICAR HCT 40-25 MG per tablet Take 0.5 tablets by mouth daily. Half tab daily    . BIOTIN PO Take 1,000 mg by mouth daily.    . Calcium Carbonate-Simethicone (MAALOX MAX PO) Take by mouth as  needed.    . Cetirizine HCl (ZYRTEC PO) Take 10 mg by mouth as needed.     . cholecalciferol (VITAMIN D) 1000 UNITS tablet Take 2,000 Units by mouth daily.     . Garlic 5638 MG CAPS Take 1 capsule by mouth daily.    . Melatonin 3 MG TABS Take 6 mg by mouth at bedtime.    . meloxicam (MOBIC) 7.5 MG tablet Take 7.5 mg by mouth as needed for pain.    . metFORMIN (GLUCOPHAGE) 500 MG tablet Take 500 mg by mouth 2 (two) times daily with a meal.    . Multiple Vitamins-Minerals (MULTIVITAMIN WITH MINERALS)  tablet Take 1 tablet by mouth daily.    . ONE TOUCH ULTRA TEST test strip     . terbinafine (LAMISIL) 250 MG tablet Take 1 tablet (250 mg total) by mouth daily. (Patient not taking: Reported on 03/31/2014) 21 tablet 0  . zolpidem (AMBIEN) 10 MG tablet Take 10 mg by mouth at bedtime as needed for sleep.     No current facility-administered medications on file prior to visit.   Allergies  Allergen Reactions  . Sulfa Antibiotics Itching   Family History  Problem Relation Age of Onset  . Colon cancer Neg Hx   . Hypertension Mother   . Diabetes Mother   . Heart attack Mother    PE: BP 118/80 mmHg  Pulse 83  Temp(Src) 98 F (36.7 C) (Oral)  Resp 16  Wt 169 lb (76.658 kg)  SpO2 98%  LMP 04/30/2012 Body mass index is 30.9 kg/(m^2).  Wt Readings from Last 3 Encounters:  07/25/14 169 lb (76.658 kg)  03/31/14 170 lb (77.111 kg)  07/31/13 169 lb (76.658 kg)   Constitutional: overweight, in NAD Eyes: PERRLA, EOMI, no exophthalmos ENT: moist mucous membranes, slight thyromegaly, no cervical lymphadenopathy Cardiovascular: RRR, No MRG Respiratory: CTA B Gastrointestinal: abdomen soft, NT, ND, BS+ Musculoskeletal: no deformities, strength intact in all 4 Skin: moist, warm, no rashes Neurological: no tremor with outstretched hands, DTR normal in all 4  ASSESSMENT: 1. DM2, non-insulin-dependent, controlled, without complications  2. Transaminitis  PLAN:  1. Patient with long-standing, controlled diabetes, on oral antidiabetic regimen, with good control. At last visit, we increased the Metformin to 2x a day. Her sugars improved after this. Will continue the current regimen - We discussed about options for treatment, and I suggested to:  Patient Instructions  Please continue Metformin XR 500 mg 2x a day with meals.  Please come back for a follow-up appointment in 3 months with your sugar log.  - continue checking sugars at different times of the day - check 1x time a day, rotating  checks - advised for yearly eye exams >> she is UTD - Return to clinic in 3 mo with sugar log  - will need to obtain records from PCP re: latest labs - A1c reportedly <7%  2. Transaminitis - she has had chronic increased LFTs >> will obtain records from PCP to monitor while on metformin  Patient like to establish care with a PCP within [place] - Dr Minna Antis leaving practice. I placed the referral.  We ask for records from PCP during the appointment and I receive them after the visit: Labs were drawn on 06/02/2014:  CMP normal, with a glucose of 93 and GFR of 83.8, BUN/creatinine 15/0.9; AST 41 (0-40), ALT 69 (0-78)  Hemoglobin A1c 6.5%  Lipid panel: 186/51/65/111  These labs are excellent!

## 2014-08-21 ENCOUNTER — Ambulatory Visit: Payer: Federal, State, Local not specified - PPO | Admitting: Internal Medicine

## 2014-08-21 DIAGNOSIS — Z0289 Encounter for other administrative examinations: Secondary | ICD-10-CM

## 2014-09-05 ENCOUNTER — Ambulatory Visit: Payer: Federal, State, Local not specified - PPO | Admitting: Internal Medicine

## 2014-09-10 ENCOUNTER — Ambulatory Visit: Payer: Federal, State, Local not specified - PPO | Admitting: Internal Medicine

## 2014-10-08 ENCOUNTER — Ambulatory Visit (INDEPENDENT_AMBULATORY_CARE_PROVIDER_SITE_OTHER): Payer: Federal, State, Local not specified - PPO | Admitting: Podiatry

## 2014-10-08 ENCOUNTER — Encounter: Payer: Self-pay | Admitting: Podiatry

## 2014-10-08 ENCOUNTER — Ambulatory Visit: Payer: Federal, State, Local not specified - PPO | Admitting: Podiatry

## 2014-10-08 VITALS — BP 121/81 | HR 72 | Ht 62.0 in | Wt 161.0 lb

## 2014-10-08 DIAGNOSIS — S90822A Blister (nonthermal), left foot, initial encounter: Secondary | ICD-10-CM

## 2014-10-08 DIAGNOSIS — L089 Local infection of the skin and subcutaneous tissue, unspecified: Secondary | ICD-10-CM | POA: Diagnosis not present

## 2014-10-08 DIAGNOSIS — B353 Tinea pedis: Secondary | ICD-10-CM

## 2014-10-08 DIAGNOSIS — M722 Plantar fascial fibromatosis: Secondary | ICD-10-CM | POA: Diagnosis not present

## 2014-10-08 MED ORDER — CLOTRIMAZOLE-BETAMETHASONE 1-0.05 % EX CREA
1.0000 | TOPICAL_CREAM | Freq: Two times a day (BID) | CUTANEOUS | Status: DC
Start: 2014-10-08 — End: 2015-04-15

## 2014-10-08 NOTE — Patient Instructions (Signed)
Seen for blistering lesion. Lotrisone cream prescribed. Wound care given. Return as needed.

## 2014-10-08 NOTE — Progress Notes (Signed)
Subjective: 54 year old female presents complaining of palpable mass plantar mid foot area that is painful to walk. Also has a pain in right arch with knot that was present since last year. This is also painful after been on feet for 8-10 hours on concrete floor.  Stated that she has orthotics but has not been wearing.   Objective: Fluid filled lesion on left plantar, 1cm diameter.  Palpable nodule along the plantar fascial band right foot painful.   Assessment: Tinea pedis with blister formation left plantar mid foot. Plantar fibroma right foot painful.   Plan: Lanced the lesion and fluid drained. Cleansed with Betadine solution and dressing applied. Apply Lotrisone cream daily till it gets dry.  Left foot plantar fibroma injection offered. Patient wish to pass at this time. Advised to wear custom orthotics.  Return if condition gets worse.

## 2014-10-09 ENCOUNTER — Telehealth: Payer: Self-pay | Admitting: *Deleted

## 2014-10-31 ENCOUNTER — Ambulatory Visit: Payer: Federal, State, Local not specified - PPO | Admitting: Internal Medicine

## 2015-04-09 ENCOUNTER — Telehealth: Payer: Self-pay | Admitting: Cardiovascular Disease

## 2015-04-09 NOTE — Telephone Encounter (Signed)
Received records from [facility] for appointment on 04/15/15 with Dr Gwenlyn Found.  Records given to [facility] (medical records) for Dr Kennon Holter schedule on 04/15/15. lp

## 2015-04-11 ENCOUNTER — Emergency Department (HOSPITAL_COMMUNITY): Payer: Federal, State, Local not specified - PPO

## 2015-04-11 ENCOUNTER — Emergency Department (HOSPITAL_COMMUNITY)
Admission: EM | Admit: 2015-04-11 | Discharge: 2015-04-11 | Disposition: A | Discharge status: Home / Self Care | Payer: Federal, State, Local not specified - PPO | Attending: Emergency Medicine | Admitting: Emergency Medicine

## 2015-04-11 ENCOUNTER — Encounter (HOSPITAL_COMMUNITY): Payer: Self-pay | Admitting: *Deleted

## 2015-04-11 DIAGNOSIS — Z79899 Other long term (current) drug therapy: Secondary | ICD-10-CM | POA: Insufficient documentation

## 2015-04-11 DIAGNOSIS — Z791 Long term (current) use of non-steroidal anti-inflammatories (NSAID): Secondary | ICD-10-CM | POA: Insufficient documentation

## 2015-04-11 DIAGNOSIS — E119 Type 2 diabetes mellitus without complications: Secondary | ICD-10-CM | POA: Diagnosis not present

## 2015-04-11 DIAGNOSIS — I10 Essential (primary) hypertension: Secondary | ICD-10-CM | POA: Diagnosis not present

## 2015-04-11 DIAGNOSIS — R079 Chest pain, unspecified: Secondary | ICD-10-CM | POA: Diagnosis present

## 2015-04-11 LAB — BASIC METABOLIC PANEL
Anion gap: 14 (ref 5–15)
BUN: 16 mg/dL (ref 6–20)
CO2: 24 mmol/L (ref 22–32)
Calcium: 10.2 mg/dL (ref 8.9–10.3)
Chloride: 100 mmol/L — ABNORMAL LOW (ref 101–111)
Creatinine, Ser: 0.9 mg/dL (ref 0.44–1.00)
GFR calc Af Amer: >60 mL/min (ref >60)
GFR calc non Af Amer: >60 mL/min (ref >60)
Glucose, Bld: 213 mg/dL — ABNORMAL HIGH (ref 65–99)
Potassium: 3.6 mmol/L (ref 3.5–5.1)
Sodium: 138 mmol/L (ref 135–145)

## 2015-04-11 LAB — TROPONIN I: Troponin I: <0.03 ng/mL (ref <0.031)

## 2015-04-11 LAB — CBC
HCT: 39.1 % (ref 36.0–46.0)
Hemoglobin: 13.4 g/dL (ref 12.0–15.0)
MCH: 30.2 pg (ref 26.0–34.0)
MCHC: 34.3 g/dL (ref 30.0–36.0)
MCV: 88.1 fL (ref 78.0–100.0)
Platelets: 165 10*3/uL (ref 150–400)
RBC: 4.44 MIL/uL (ref 3.87–5.11)
RDW: 12.6 % (ref 11.5–15.5)
WBC: 4.9 10*3/uL (ref 4.0–10.5)

## 2015-04-11 LAB — MAGNESIUM: Magnesium: 1.4 mg/dL — ABNORMAL LOW (ref 1.7–2.4)

## 2015-04-11 MED ORDER — POTASSIUM CHLORIDE CRYS ER 20 MEQ PO TBCR
40.0000 meq | EXTENDED_RELEASE_TABLET | Freq: Once | ORAL | Status: AC
  Administered 2015-04-11: 40 meq via ORAL
  Filled 2015-04-11: qty 2

## 2015-04-11 MED ORDER — MAGNESIUM SULFATE 2 GM/50ML IV SOLN
2.0000 g | Freq: Once | INTRAVENOUS | Status: AC
  Administered 2015-04-11: 2 g via INTRAVENOUS
  Filled 2015-04-11: qty 50

## 2015-04-11 NOTE — Discharge Instructions (Signed)
Hypomagnesemia Vicki [name], your potassium and magnesium levels were low today. This may be causing your palpitations. See your primary care physician within 3 days for close follow-up. If any symptoms worsen, come back to the emergency department immediately. Thank you. Hypomagnesemia is a condition in which the level of magnesium in the blood is low. Magnesium is a mineral that is found in many foods. It is used in many different processes in the body. Hypomagnesemia can affect every organ in the body. It can cause life-threatening problems. CAUSES Causes of hypomagnesemia include:  Not getting enough magnesium in your diet.  Malnutrition.  Problems with absorbing magnesium from the intestines.  Dehydration.  Alcohol abuse.  Vomiting.  Severe diarrhea.  Some medicines, including medicines that make you urinate more.  Certain diseases, such as kidney disease, diabetes, and overactive thyroid. SIGNS AND SYMPTOMS  Involuntary shaking or trembling of a body part (tremor).  Confusion.  Muscle weakness.  Sensitivity to light, sound, and touch.  Psychiatric issues, such as depression, irritability, or psychosis.  Sudden tightening of muscles (muscle spasms).  Tingling in the arms and legs.  A feeling of fluttering of the heart. These symptoms are more severe if magnesium levels drop suddenly. DIAGNOSIS To make a diagnosis, your health care provider will do a physical exam and order blood and urine tests. TREATMENT Treatment will depend on the cause and the severity of your condition. It may involve:  A magnesium supplement. This can be taken in pill form. It can also be given through an IV tube. This is usually done if the condition is severe.  Changes to your diet. You may be directed to eat foods that have a lot of magnesium, such as green leafy vegetables, peas, beans, and nuts.  Eliminating alcohol from your diet. HOME CARE INSTRUCTIONS  Include foods with  magnesium in your diet. Foods that are rich in magnesium include green vegetables, beans, nuts and seeds, and whole grains.  Take medicines only as directed by your health care provider.  Take magnesium supplements if your health care provider instructs you to do that. Take them as directed.  Have your magnesium levels monitored as directed by your health care provider.  When you are active, drink fluids that contain electrolytes.  Keep all follow-up visits as directed by your health care provider. This is important. SEEK MEDICAL CARE IF:  You get worse instead of better.  Your symptoms return. SEEK IMMEDIATE MEDICAL CARE IF:  Your symptoms are severe.   This information is not intended to replace advice given to you by your health care provider. Make sure you discuss any questions you have with your health care provider.   Document Released: 11/17/2004 Document Revised: 03/14/2014 Document Reviewed: 10/07/2013 Elsevier Interactive Patient Education [employer].

## 2015-04-11 NOTE — ED Provider Notes (Signed)
CSN: [id number]     Arrival date & time 04/11/15  0038 History   By signing my name below, I, Emmanuella Mensah, attest that this documentation has been prepared under the direction and in the presence of Everlene Balls, MD. Electronically Signed: Judithann Sauger, ED Scribe. 04/11/2015. 3:47 AM.    Chief Complaint  Patient presents with  . Chest Pain   The history is provided by the patient. No language interpreter was used.   HPI Comments: Vicki [name] is a 55 y.o. female with a hx of DM and HTN who presents to the Emergency Department complaining of gradually worsening intermittent episodes of palpitations onset one week ago. She explains that she feels like her "heart is skipping". She reports associated rhinorrhea onset today. She adds that she has been eating and drinking adequately. She states that she went to see her PCP but nothing was done and was informed that her pherotene and liver enzymes were high. She denies a hx of PE/DVT or any heart issues. She also denies any recent surgeries or recent long car/plan rides. She denies any CP, fever, chills, SOB, n/v, cough, leg swelling, numbness, weakness, or dizziness.    Past Medical History  Diagnosis Date  . Diabetes ([place])   . Hypertension   . Allergy   . Boil    Past Surgical History  Procedure Laterality Date  . Tonsillectomy  1980  . Ectopic pregnancy surgery  1990   Family History  Problem Relation Age of Onset  . Colon cancer Neg Hx   . Hypertension Mother   . Diabetes Mother   . Heart attack Mother    Social History  Substance Use Topics  . Smoking status: Never Smoker   . Smokeless tobacco: Never Used  . Alcohol Use: 1.8 oz/week    3 Standard drinks or equivalent per week   OB History    No data available     Review of Systems  Constitutional: Negative for fever and chills.  Respiratory: Negative for cough and shortness of breath.   Cardiovascular: Positive for palpitations. Negative for chest pain and leg  swelling.  Gastrointestinal: Negative for nausea and vomiting.  Neurological: Negative for dizziness, weakness and numbness.  All other systems reviewed and are negative.   A complete 10 system review of systems was obtained and all systems are negative except as noted in the HPI and PMH.    Allergies  Sulfa antibiotics  Home Medications   Prior to Admission medications   Medication Sig Start Date End Date Taking? Authorizing Provider  acetaminophen (TYLENOL) 500 MG tablet Take 500 mg by mouth as needed for pain.    Historical Provider, MD  acyclovir ointment (ZOVIRAX) 5 % Apply 1 application topically every 3 (three) hours. 07/22/14   Billy Fischer, MD  BENICAR HCT 40-25 MG per tablet Take 0.5 tablets by mouth daily. Half tab daily 09/02/12   Historical Provider, MD  BIOTIN PO Take 1,000 mg by mouth daily.    Historical Provider, MD  Calcium Carbonate-Simethicone (MAALOX MAX PO) Take by mouth as needed.    Historical Provider, MD  Cetirizine HCl (ZYRTEC PO) Take 10 mg by mouth as needed.     Historical Provider, MD  cholecalciferol (VITAMIN D) 1000 UNITS tablet Take 2,000 Units by mouth daily.     Historical Provider, MD  clotrimazole-betamethasone (LOTRISONE) cream Apply 1 application topically 2 (two) times daily. 10/08/14   Myeong O Sheard, DPM  Garlic 123XX123 MG CAPS Take 1 capsule  by mouth daily.    Historical Provider, MD  Melatonin 3 MG TABS Take 6 mg by mouth at bedtime.    Historical Provider, MD  meloxicam (MOBIC) 7.5 MG tablet Take 7.5 mg by mouth as needed for pain.    Historical Provider, MD  metFORMIN (GLUCOPHAGE) 500 MG tablet Take 500 mg by mouth 2 (two) times daily with a meal.    Historical Provider, MD  Multiple Vitamins-Minerals (MULTIVITAMIN WITH MINERALS) tablet Take 1 tablet by mouth daily.    Historical Provider, MD  ONE TOUCH ULTRA TEST test strip  05/14/12   Historical Provider, MD  terbinafine (LAMISIL) 250 MG tablet Take 1 tablet (250 mg total) by mouth daily.  07/31/13   Myeong O Sheard, DPM  zolpidem (AMBIEN) 10 MG tablet Take 10 mg by mouth at bedtime as needed for sleep.    Historical Provider, MD   BP 123/77 mmHg  Pulse 95  Temp(Src) 98 F (36.7 C) (Oral)  Resp 16  SpO2 98%  LMP 04/30/2012 Physical Exam  Constitutional: She is oriented to person, place, and time. She appears well-developed and well-nourished. No distress.  HENT:  Head: Normocephalic and atraumatic.  Nose: Nose normal.  Mouth/Throat: Oropharynx is clear and moist. No oropharyngeal exudate.  Eyes: Conjunctivae and EOM are normal. Pupils are equal, round, and reactive to light. No scleral icterus.  Neck: Normal range of motion. Neck supple. No JVD present. No tracheal deviation present. No thyromegaly present.  Cardiovascular: Normal rate, regular rhythm and normal heart sounds.  Exam reveals no gallop and no friction rub.   No murmur heard. Pulmonary/Chest: Effort normal and breath sounds normal. No respiratory distress. She has no wheezes. She exhibits no tenderness.  Abdominal: Soft. Bowel sounds are normal. She exhibits no distension and no mass. There is no tenderness. There is no rebound and no guarding.  Musculoskeletal: Normal range of motion. She exhibits no edema or tenderness.  Lymphadenopathy:    She has no cervical adenopathy.  Neurological: She is alert and oriented to person, place, and time. No cranial nerve deficit. She exhibits normal muscle tone.  Skin: Skin is warm and dry. No rash noted. No erythema. No pallor.  Nursing note and vitals reviewed.   ED Course  Procedures (including critical care time) DIAGNOSTIC STUDIES: Oxygen Saturation is 98% on RA, normal by my interpretation.    COORDINATION OF CARE: 3:24 AM- Pt advised of plan for treatment and pt agrees. Pt informed of EKG results. She will receive a potassium chloride tablet and magnesium sulfate.    Labs Review Labs Reviewed  BASIC METABOLIC PANEL - Abnormal; Notable for the following:     Chloride 100 (*)    Glucose, Bld 213 (*)    All other components within normal limits  MAGNESIUM - Abnormal; Notable for the following:    Magnesium 1.4 (*)    All other components within normal limits  CBC  TROPONIN I    Imaging Review Dg Chest 2 View  04/11/2015  CLINICAL DATA:  Heart palpitations for 3 days EXAM: CHEST  2 VIEW COMPARISON:  None. FINDINGS: Normal heart size and mediastinal contours. No acute infiltrate or edema. No effusion or pneumothorax. No acute osseous findings. IMPRESSION: Negative chest. Electronically Signed   By: Monte Fantasia M.D.   On: 04/11/2015 01:09   Everlene Balls, MD has personally reviewed and evaluated these images and lab results as part of his medical decision-making.   EKG Interpretation   Date/Time:  Saturday April 11 2015 00:45:52 EST Ventricular Rate:  93 PR Interval:  156 QRS Duration: 72 QT Interval:  348 QTC Calculation: 432 R Axis:   -14 Text Interpretation:  Sinus rhythm with occasional Premature ventricular  complexes Cannot rule out Anterior infarct , age undetermined Abnormal ECG  No old tracing to compare Confirmed by Glynn Octave [id number]) on  04/11/2015 2:41:36 AM      MDM   Final diagnoses:  None   Patient presents to emergency department for palpitations. She denies any chest pain. Troponin was ordered by triage and not clinically indicated, this was negative. Electrolytes find that her potassium is 3.6, magnesium 1.4. This may be the cause of her palpitations. She has not had any arrhythmias on the monitor. EKG does not reveal any signs of ischemia. She does have PVCs. She was given magnesium and potassium replacement. She was advised to see her primary care physician within 3 days for close follow-up and possible initiation of electrolyte supplements. She appears well and in no acute distress, vital signs within her normal limits and she is safe for discharge.    I personally performed the services described  in this documentation, which was scribed in my presence. The recorded information has been reviewed and is accurate.     Everlene Balls, MD 04/11/15 [phone]

## 2015-04-11 NOTE — ED Notes (Signed)
The pt is c/o an irregular heart beat for 2-3 days.  She was at a concert tonight and the music was so loud the discomfort increased..  No son no dizziness

## 2015-04-11 NOTE — ED Notes (Signed)
Pt alert, NAD, calm, interactive, resps e/u,speaking in clear complete sentences, skin W&D, no dyspnea noted, (denies: pain, sob, nausea, dizziness or other sx), VSS, denies questions or needs.

## 2015-04-15 ENCOUNTER — Ambulatory Visit (INDEPENDENT_AMBULATORY_CARE_PROVIDER_SITE_OTHER): Payer: Federal, State, Local not specified - PPO

## 2015-04-15 ENCOUNTER — Ambulatory Visit (INDEPENDENT_AMBULATORY_CARE_PROVIDER_SITE_OTHER): Payer: Federal, State, Local not specified - PPO | Admitting: Cardiovascular Disease

## 2015-04-15 ENCOUNTER — Encounter: Payer: Self-pay | Admitting: Cardiovascular Disease

## 2015-04-15 VITALS — BP 140/82 | HR 84 | Ht 62.0 in | Wt 169.5 lb

## 2015-04-15 DIAGNOSIS — R002 Palpitations: Secondary | ICD-10-CM | POA: Insufficient documentation

## 2015-04-15 LAB — T4, FREE: Free T4: 1.2 ng/dL (ref 0.8–1.8)

## 2015-04-15 LAB — TSH: TSH: 1.11 mIU/L

## 2015-04-15 NOTE — Progress Notes (Signed)
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 Maudie Mercury for cardiac evaluation because of new onset palpitations. Factors include treated hypertension and diabetes. Her mother did have a myocardial infarction at age 83. She has never had a heart attack or stroke and denies chest pain or shortness of breath. She is in her perimenopause time window is followed by Dr. Dellis Filbert. She drinks 2 cups of caffeinated beverages a day as well as alcohol on the weekends. She is not under a lot of stress recently.   Current Outpatient Prescriptions  Medication Sig Dispense Refill  . acetaminophen (TYLENOL) 500 MG tablet Take 500 mg by mouth as needed for pain.    [name] BENICAR HCT 40-25 MG per tablet Take 0.5 tablets by mouth daily. Half tab daily    . Calcium Carbonate-Simethicone (MAALOX MAX PO) Take by mouth as needed.    . cholecalciferol (VITAMIN D) 1000 UNITS tablet Take 2,000 Units by mouth daily.     . Garlic 6073 MG CAPS Take 1 capsule by mouth daily.    . meloxicam (MOBIC) 7.5 MG tablet Take 7.5 mg by mouth as needed for pain.    . metFORMIN (GLUCOPHAGE) 500 MG tablet Take 500 mg by mouth 2 (two) times daily with a meal.    . ONE TOUCH ULTRA TEST test strip      No current facility-administered medications for this visit.    Allergies  Allergen Reactions  . Sulfa Antibiotics Itching    Social History   Social History  . Marital Status: Single    Spouse Name: N/A  . Number of Children: N/A  . Years of Education: N/A   Occupational History  . Not on file.   Social History Main Topics  . Smoking status: Never Smoker   . Smokeless tobacco: Never Used  . Alcohol Use: 1.8 oz/week    3 Standard drinks or equivalent per week  . Drug  Use: No  . Sexual Activity: Yes    Birth Control/ Protection: Condom   Other Topics Concern  . Not on file   Social History Narrative     Review of Systems: General: negative for chills, fever, night sweats or weight changes.  Cardiovascular: negative for chest pain, dyspnea on exertion, edema, orthopnea, palpitations, paroxysmal nocturnal dyspnea or shortness of breath Dermatological: negative for rash Respiratory: negative for cough or wheezing Urologic: negative for hematuria Abdominal: negative for nausea, vomiting, diarrhea, bright red blood per rectum, melena, or hematemesis Neurologic: negative for visual changes, syncope, or dizziness All other systems reviewed and are otherwise negative except as noted above.    Blood pressure 140/82, pulse 84, height 5' 2"  (1.575 m), weight 169 lb 8 oz (76.885 kg), last menstrual period 04/30/2012.  General appearance: alert and no distress Neck: no adenopathy, no carotid bruit, no JVD, supple, symmetrical, trachea midline and thyroid not enlarged, symmetric, no tenderness/mass/nodules Lungs: clear to auscultation bilaterally Heart: regular rate and rhythm, S1, S2 normal, no murmur, click, rub or gallop Extremities: extremities normal, atraumatic, no cyanosis or edema  EKG normal sinus rhythm 84 without ST or T-wave changes. I personally reviewed this EKG  ASSESSMENT AND PLAN:   Palpitations Mrs. Feeley was referred to me by Dr. Maudie Mercury for evaluation of  palpitations. Her cardiac risk factors include treated hypertension and diabetes. Her mother did have a myocardial infarction at age 75. She has never had a heart attack or stroke and denies chest pain or shortness of breath. She does drink 2 cups of caffeine beverages a day and admits to going through menopause currently. The palpitations began approximately 2 weeks ago. They occur daily. There is no presyncope. She was here in the emergency room for evaluation recently for evaluation of  palpitations and was noted to have a slightly low potassium and magnesium level which were both repleted. She was noted to have PVCs on telemetry. Her EKG showed no acute changes and her cardiac enzymes were negative. She does drink alcohol on the weekends apparently has elevated liver function tests as well. I discussed her limiting her caffeine intake as well as alcohol. We will get a 2-D echo and a 2 week event monitor and I will see her back after that for further evaluation. I'm also going to get thyroid function tests.      Vicki Harp MD FACP,FACC,FAHA, [facility] 04/15/2015 9:11 AM

## 2015-04-15 NOTE — Assessment & Plan Note (Signed)
Vicki [name] was referred to me by Dr. Maudie Mercury for evaluation of palpitations. Her cardiac risk factors include treated hypertension and diabetes. Her mother did have a myocardial infarction at age 56. She has never had a heart attack or stroke and denies chest pain or shortness of breath. She does drink 2 cups of caffeine beverages a day and admits to going through menopause currently. The palpitations began approximately 2 weeks ago. They occur daily. There is no presyncope. She was here in the emergency room for evaluation recently for evaluation of palpitations and was noted to have a slightly low potassium and magnesium level which were both repleted. She was noted to have PVCs on telemetry. Her EKG showed no acute changes and her cardiac enzymes were negative. She does drink alcohol on the weekends apparently has elevated liver function tests as well. I discussed her limiting her caffeine intake as well as alcohol. We will get a 2-D echo and a 2 week event monitor and I will see her back after that for further evaluation. I'm also going to get thyroid function tests.

## 2015-04-15 NOTE — Patient Instructions (Signed)
Medication Instructions: Dr Gwenlyn Found has made no changes to your current medications.  Labwork: Your physician recommends that you return for lab work TODAY.  Testing/Procedures: 1. Your physician has requested that you have an echocardiogram. Echocardiography is a painless test that uses sound waves to create images of your heart. It provides your doctor with information about the size and shape of your heart and how well your heart's chambers and valves are working. This procedure takes approximately one hour. There are no restrictions for this procedure.  2. Your physician has recommended that you wear an event monitor. Event monitors are medical devices that record the heart's electrical activity. Doctors most often [place] these monitors to diagnose arrhythmias. Arrhythmias are problems with the speed or rhythm of the heartbeat. The monitor is a small, portable device. You can wear one while you do your normal daily activities. This is usually used to diagnose what is causing palpitations/syncope (passing out).  Follow-up: Dr Gwenlyn Found recommends that you schedule a follow-up appointment in 4-6 weeks.  If you need a refill on your cardiac medications before your next appointment, please call your pharmacy.  Your Doctor has ordered you to wear a heart monitor. You will wear this for 14 days.   TIPS -  REMINDERS 1. The sensor is the lanyard that is worn around your neck every day - this is powered by a battery that needs to be changed every day 2. The monitor is the device that allows you to record symptoms - this will need to be charged daily 3. The sensor & monitor need to be within 100 feet of each other at all times 4. The sensor connects to the electrodes (stickers) - these should be changed every 24-48 hours (you do not have to remove them when you bathe, just make sure they are dry when you connect it back to the sensor 5. If you need more supplies (electrodes, batteries), please call the 1-800  # on the back of the pamphlet and [employer] will mail you more supplies 6. If your skin becomes sensitive, please try the sample pack of sensitive skin electrodes (the white packet in your silver box) and call [employer] to have them mail you more of these type of electrodes 7. When you are finish wearing the monitor, please place all supplies back in the silver box, place the silver box in the pre-packaged UPS bag and drop off at [employer] or call them so they can come pick it up   Cardiac Event Monitoring A cardiac event monitor is a small recording device used to help detect abnormal heart rhythms (arrhythmias). The monitor is used to record heart rhythm when noticeable symptoms such as the following occur:  Fast heartbeats (palpitations), such as heart racing or fluttering.  Dizziness.  Fainting or light-headedness.  Unexplained weakness. The monitor is wired to two electrodes placed on your chest. Electrodes are flat, sticky disks that attach to your skin. The monitor can be worn for up to 30 days. You will wear the monitor at all times, except when bathing.  HOW TO USE YOUR CARDIAC EVENT MONITOR A technician will prepare your chest for the electrode placement. The technician will show you how to place the electrodes, how to work the monitor, and how to replace the batteries. Take time to practice using the monitor before you leave the office. Make sure you understand how to send the information from the monitor to your health care provider. This requires a telephone with a landline,  not a cell phone. You need to:  Wear your monitor at all times, except when you are in water:  Do not get the monitor wet.  Take the monitor off when bathing. Do not swim or use a hot tub with it on.  Keep your skin clean. Do not put body lotion or moisturizer on your chest.  Change the electrodes daily or any time they stop sticking to your skin. You might need to use tape to keep them on.  It is possible  that your skin under the electrodes could become irritated. To keep this from happening, try to put the electrodes in slightly different places on your chest. However, they must remain in the area under your left breast and in the upper right section of your chest.  Make sure the monitor is safely clipped to your clothing or in a location close to your body that your health care provider recommends.  Press the button to record when you feel symptoms of heart trouble, such as dizziness, weakness, light-headedness, palpitations, thumping, shortness of breath, unexplained weakness, or a fluttering or racing heart. The monitor is always on and records what happened slightly before you pressed the button, so do not worry about being too late to get good information.  Keep a diary of your activities, such as walking, doing chores, and taking medicine. It is especially important to note what you were doing when you pushed the button to record your symptoms. This will help your health care provider determine what might be contributing to your symptoms. The information stored in your monitor will be reviewed by your health care provider alongside your diary entries.  Send the recorded information as recommended by your health care provider. It is important to understand that it will take some time for your health care provider to process the results.  Change the batteries as recommended by your health care provider. SEEK IMMEDIATE MEDICAL CARE IF:   You have chest pain.  You have extreme difficulty breathing or shortness of breath.  You develop a very fast heartbeat that persists.  You develop dizziness that does not go away.  You faint or constantly feel you are about to faint. Document Released: 12/01/2007 Document Revised: 07/08/2013 Document Reviewed: 08/20/2012 [facility] Patient Information 2015 [place], [place]. This information is not intended to replace advice given to you by your health care  provider. Make sure you discuss any questions you have with your health care provider.

## 2015-04-20 ENCOUNTER — Ambulatory Visit: Payer: Federal, State, Local not specified - PPO | Admitting: Internal Medicine

## 2015-04-24 ENCOUNTER — Other Ambulatory Visit: Payer: Self-pay

## 2015-04-24 ENCOUNTER — Ambulatory Visit (HOSPITAL_COMMUNITY): Payer: Federal, State, Local not specified - PPO | Attending: Cardiology

## 2015-04-24 DIAGNOSIS — R002 Palpitations: Secondary | ICD-10-CM

## 2015-04-28 DIAGNOSIS — R002 Palpitations: Secondary | ICD-10-CM

## 2015-05-01 ENCOUNTER — Telehealth: Payer: Self-pay | Admitting: Cardiovascular Disease

## 2015-05-01 NOTE — Telephone Encounter (Signed)
Advised results of echo normal. No further questions.

## 2015-05-01 NOTE — Telephone Encounter (Signed)
Returning call from yesterday,she does not know who called. She thinks it might be about test results.

## 2015-05-21 ENCOUNTER — Other Ambulatory Visit: Payer: Self-pay | Admitting: Internal Medicine

## 2015-05-21 DIAGNOSIS — R945 Abnormal results of liver function studies: Secondary | ICD-10-CM

## 2015-05-21 DIAGNOSIS — R7989 Other specified abnormal findings of blood chemistry: Secondary | ICD-10-CM

## 2015-06-01 ENCOUNTER — Other Ambulatory Visit: Payer: Federal, State, Local not specified - PPO

## 2015-06-05 ENCOUNTER — Ambulatory Visit
Admission: RE | Admit: 2015-06-05 | Discharge: 2015-06-05 | Disposition: A | Discharge status: Home / Self Care | Payer: Federal, State, Local not specified - PPO | Source: Ambulatory Visit | Attending: Internal Medicine | Admitting: Internal Medicine

## 2015-06-05 DIAGNOSIS — R7989 Other specified abnormal findings of blood chemistry: Secondary | ICD-10-CM

## 2015-06-05 DIAGNOSIS — R945 Abnormal results of liver function studies: Secondary | ICD-10-CM

## 2015-06-10 ENCOUNTER — Ambulatory Visit (INDEPENDENT_AMBULATORY_CARE_PROVIDER_SITE_OTHER): Payer: Federal, State, Local not specified - PPO | Admitting: Cardiovascular Disease

## 2015-06-10 ENCOUNTER — Encounter: Payer: Self-pay | Admitting: Cardiovascular Disease

## 2015-06-10 VITALS — BP 140/78 | HR 68 | Ht 62.0 in | Wt 167.0 lb

## 2015-06-10 DIAGNOSIS — R002 Palpitations: Secondary | ICD-10-CM

## 2015-06-10 NOTE — Patient Instructions (Signed)
Medication Instructions:  Your physician recommends that you continue on your current medications as directed. Please refer to the Current Medication list given to you today.   Labwork: none  Testing/Procedures: none  Follow-Up: Your physician recommends that you schedule a follow-up appointment in: 3 months with Dr. Gwenlyn Found.   Any Other Special Instructions Will Be Listed Below (If Applicable).   Your physician recommends that you decrease/stop you intake of caffeine and alcohol.     If you need a refill on your cardiac medications before your next appointment, please call your pharmacy.

## 2015-06-10 NOTE — Assessment & Plan Note (Signed)
Mrs. Tucciarone returns for follow-up of her outpatient studies. A 2-D echo was normal. An event monitor showed sinus rhythm/sinus tachycardia with frequent PVCs. We've talked about limiting caffeine or alcohol intake. I also discussed the possibility of alertness empiric beta blocker. The patient prefers dietary modification initially. I will see her back in 3 months

## 2015-06-10 NOTE — Progress Notes (Signed)
Vicki [name] returns for follow-up of her outpatient studies. A 2-D echo was normal. An event monitor showed sinus rhythm/sinus tachycardia with frequent PVCs. We've talked about limiting caffeine or alcohol intake. I also discussed the possibility of alertness empiric beta blocker. The patient prefers dietary modification initially. I will see her back in 3 mont

## 2015-06-12 ENCOUNTER — Other Ambulatory Visit: Payer: Federal, State, Local not specified - PPO

## 2015-06-25 ENCOUNTER — Ambulatory Visit: Payer: Federal, State, Local not specified - PPO | Admitting: Gastroenterology

## 2015-06-26 ENCOUNTER — Encounter: Payer: Self-pay | Admitting: Gastroenterology

## 2015-06-26 ENCOUNTER — Ambulatory Visit (INDEPENDENT_AMBULATORY_CARE_PROVIDER_SITE_OTHER): Payer: Federal, State, Local not specified - PPO | Admitting: Gastroenterology

## 2015-06-26 VITALS — BP 128/76 | HR 84 | Ht 61.0 in | Wt 168.2 lb

## 2015-06-26 DIAGNOSIS — R945 Abnormal results of liver function studies: Secondary | ICD-10-CM

## 2015-06-26 DIAGNOSIS — K760 Fatty (change of) liver, not elsewhere classified: Secondary | ICD-10-CM

## 2015-06-26 DIAGNOSIS — R7989 Other specified abnormal findings of blood chemistry: Secondary | ICD-10-CM

## 2015-06-26 NOTE — Patient Instructions (Signed)
We will collect lab records from your primary.  Thank you for choosing [facility]  Dr Wilfrid Lund III

## 2015-06-26 NOTE — Progress Notes (Signed)
[facility] Consult Note:  History: Vicki [name] 06/26/2015  Referring physician: Jani Gravel, MD  Reason for consult/chief complaint: elevated LFT''s   Subjective HPI:  Vicki [name] was seen in consultation for abnormal LFTs. I'm afraid the referral had no lab results with it, but we were able to find a set of some recent labs through another laboratory portal. It seems that her ALT is a been elevated for some time, perhaps as far back as October 2015. Found a report in the [facility] computer of an ultrasound of that time showing increased hepatic echotexture. She had another ultrasound done about 2 months ago showing the same thing. She was very concerned because she was told that her LFTs were getting worse and she might of hemachromatosis. Ferritin was 800 last month, and I know she was sent for genetic testing but I don't have the results today. Her alcohol use is minimal, perhaps just 2 drinks every Saturday night. She has no personal or family history of liver disease. She recalls that her doctor tested her for viral hepatitis.    ROS:  Review of Systems  Constitutional: Negative for appetite change and unexpected weight change.  HENT: Negative for mouth sores and voice change.   Eyes: Negative for pain and redness.  Respiratory: Negative for cough and shortness of breath.   Cardiovascular: Positive for palpitations. Negative for chest pain.  Genitourinary: Negative for dysuria and hematuria.  Musculoskeletal: Negative for myalgias and arthralgias.  Skin: Negative for pallor and rash.  Neurological: Negative for weakness and headaches.  Hematological: Negative for adenopathy.     Past Medical History: Past Medical History  Diagnosis Date  . Diabetes ([place])     type 2  . Hypertension   . Allergy   . Boil   . Palpitations     frequent PVCs on event monitor  . Hyperlipemia   . Elevated liver enzymes   . Fatty liver   . PVC's (premature ventricular contractions)     Vicki [name] had a screening colonoscopy with Dr. Sharlett Iles in April 2014, normal except for mild sigmoid diverticulosis.  Past Surgical History: Past Surgical History  Procedure Laterality Date  . Tonsillectomy  1980  . Ectopic pregnancy surgery  1990  . Colonoscopy  2014     Family History: Family History  Problem Relation Age of Onset  . Colon cancer Neg Hx   . Hypertension Mother   . Diabetes Mother   . Heart attack Mother   . Hypertension Father   . Heart attack Sister 64  . Diabetes Sister   . Hypertension Sister   . Goiter    . Asthma      Social History: Social History   Social History  . Marital Status: Single    Spouse Name: N/A  . Number of Children: N/A  . Years of Education: N/A   Social History Main Topics  . Smoking status: Never Smoker   . Smokeless tobacco: Never Used  . Alcohol Use: 1.8 oz/week    3 Standard drinks or equivalent per week  . Drug Use: No  . Sexual Activity: Yes    Birth Control/ Protection: Condom   Other Topics Concern  . None   Social History Narrative   Epworth Sleepiness Scale = 3 (as of 04/15/2015)    Allergies: Allergies  Allergen Reactions  . Sulfa Antibiotics Itching    Outpatient Meds: Current Outpatient Prescriptions  Medication Sig Dispense Refill  . acetaminophen (TYLENOL) 500 MG tablet Take 500 mg  by mouth as needed for pain.    [name] BENICAR HCT 40-25 MG per tablet Take 0.5 tablets by mouth daily. Half tab daily    . Calcium Carbonate-Simethicone (MAALOX MAX PO) Take by mouth as needed.    . cholecalciferol (VITAMIN D) 1000 UNITS tablet Take 2,000 Units by mouth daily.     . fexofenadine (ALLEGRA) 180 MG tablet Take 180 mg by mouth daily.    . Garlic 3536 MG CAPS Take 1 capsule by mouth daily. OFF AND ON    . metFORMIN (GLUCOPHAGE) 500 MG tablet Take 500 mg by mouth 3 (three) times daily.     . ONE TOUCH ULTRA TEST test strip     . YUVAFEM 10 MCG TABS vaginal tablet Place 10 mcg vaginally 2 (two) times a week.       No current facility-administered medications for this visit.      ___________________________________________________________________ Objective  Exam:  BP 128/76 mmHg  Pulse 84  Ht 5' 1"  (1.549 m)  Wt 168 lb 4 oz (76.318 kg)  BMI 31.81 kg/m2  LMP 04/30/2012   General: this is a(n) Modestly overweight middle-aged African-American woman with good muscle mass, in no acute distress   Eyes: sclera anicteric, no redness  ENT: oral mucosa moist without lesions, no cervical or supraclavicular lymphadenopathy, good dentition  CV: RRR without murmur, S1/S2, no JVD, no peripheral edema  Resp: clear to auscultation bilaterally, normal RR and effort noted  GI: soft, no tenderness, with active bowel sounds. With inspiration, the right hepatic lobe is felt 2 fingerbreadths below the costal margin   Skin; warm and dry, no rash or jaundice noted  Neuro: awake, alert and oriented x 3. Normal gross motor function and fluent speech  Labs:  AST over 300 ALT over 250 alkaline phosphatase and total bilirubin normal Hemoglobin 13.7 Ferritin 807 Total cholesterol 207, LDL 131, hemoglobin A1c 6.8 Radiologic Studies:  Hepatic ultrasounds as noted above  Assessment: Encounter Diagnoses  Name Primary?  . Fatty liver disease, nonalcoholic Yes  . LFT elevation     We will get the results of further lab tests early next week, since her PCPs office is closed right now. 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 Autoimmune hepatitis is less likely.  Plan:  Review further lab tests when they become available early next week. Then plan any further necessary lab testing including autoimmune markers and perhaps repeat LFTs since the most recent ones are about a month old. We discussed the nature of fatty liver and NASH, and therefore the need for good control of  glucose lipids and weight loss. There are currently no available medicines that of been proven to be of benefit in reducing fatty liver or the resultant inflammation.  Thank you for the courtesy of this consult.  Please call me with any questions or concerns.  Nelida Meuse III

## 2015-06-30 ENCOUNTER — Telehealth: Payer: Self-pay | Admitting: Gastroenterology

## 2015-07-01 NOTE — Telephone Encounter (Signed)
Left a voicemail for the pt to inform her that we have received labs from Dr Maudie Mercury. We had to request one more lab result. After getting the Hemochromatosis result Dr Loletha Carrow will give [place] direction from there.

## 2015-07-03 DIAGNOSIS — H10413 Chronic giant papillary conjunctivitis, bilateral: Secondary | ICD-10-CM | POA: Diagnosis not present

## 2015-07-06 NOTE — Telephone Encounter (Signed)
Called and talked to the referral dept. They will fax addition lab requested. Labs were received.

## 2015-07-07 ENCOUNTER — Telehealth: Payer: Self-pay | Admitting: Gastroenterology

## 2015-07-07 NOTE — Telephone Encounter (Signed)
Pt notified and aware we have recieved a copy of all labs. Pt informed that after Dr Loletha Carrow' review we will contact her with his recommendations.

## 2015-07-09 ENCOUNTER — Telehealth: Payer: Self-pay | Admitting: Gastroenterology

## 2015-07-09 NOTE — Telephone Encounter (Signed)
[name],    Please tell Vicki [name] that I have received and reviewed the remainder of the labs from her PCP.  She tested negative for Autoimmune Hepatitis, and the genetic test for hemochromatosis was negative.  It is important to know that a minority of hemochromatosis patients are not picked up on this genetic testing. Even though her iron levels and liver labs are higher than one typically sees with fatty liver (as we discussed in the office), I still think fatty liver is the most likely diagnosis.  Here is my advice:   1. I know she does not have more than a few alcoholic drinks per week, but just while we figure this out, please abstain from all alcohol for the next 4 weeks. 2. In 4 weeks, please check AST,ALT,Alk Phos, Total bilirubin, Iron, TIBC and Ferritin 3.  Please see me in 5-6 weeks and we will review all and decide how to proceed.  Thanks.  - HD

## 2015-07-13 DIAGNOSIS — Z1231 Encounter for screening mammogram for malignant neoplasm of breast: Secondary | ICD-10-CM | POA: Diagnosis not present

## 2015-07-15 ENCOUNTER — Telehealth: Payer: Self-pay | Admitting: Gastroenterology

## 2015-07-15 DIAGNOSIS — H6120 Impacted cerumen, unspecified ear: Secondary | ICD-10-CM | POA: Diagnosis not present

## 2015-07-15 DIAGNOSIS — H6590 Unspecified nonsuppurative otitis media, unspecified ear: Secondary | ICD-10-CM | POA: Diagnosis not present

## 2015-07-15 DIAGNOSIS — J019 Acute sinusitis, unspecified: Secondary | ICD-10-CM | POA: Diagnosis not present

## 2015-07-15 NOTE — Telephone Encounter (Signed)
Left message on machine to call back    Vicki [name],   Please tell Vicki [name] that I have received and reviewed the remainder of the labs from her PCP. She tested negative for Autoimmune Hepatitis, and the genetic test for hemochromatosis was negative. It is important to know that a minority of hemochromatosis patients are not picked up on this genetic testing. Even though her iron levels and liver labs are higher than one typically sees with fatty liver (as we discussed in the office), I still think fatty liver is the most likely diagnosis.  Here is my advice:  1. I know she does not have more than a few alcoholic drinks per week, but just while we figure this out, please abstain from all alcohol for the next 4 weeks. 2. In 4 weeks, please check AST,ALT,Alk Phos, Total bilirubin, Iron, TIBC and Ferritin 3. Please see me in 5-6 weeks and we will review all and decide how to proceed.  Thanks.  - HD

## 2015-07-16 ENCOUNTER — Other Ambulatory Visit: Payer: Self-pay

## 2015-07-16 DIAGNOSIS — R945 Abnormal results of liver function studies: Principal | ICD-10-CM

## 2015-07-16 DIAGNOSIS — R7989 Other specified abnormal findings of blood chemistry: Secondary | ICD-10-CM

## 2015-07-16 NOTE — Telephone Encounter (Signed)
Pt has been notified of the results and recommendations from Dr Loletha Carrow, She will come in in 4 weeks for repeat labs and has a f/u scheduled for 09/01/15 with Dr Loletha Carrow

## 2015-07-31 DIAGNOSIS — Z01419 Encounter for gynecological examination (general) (routine) without abnormal findings: Secondary | ICD-10-CM | POA: Diagnosis not present

## 2015-07-31 DIAGNOSIS — Z1151 Encounter for screening for human papillomavirus (HPV): Secondary | ICD-10-CM | POA: Diagnosis not present

## 2015-07-31 DIAGNOSIS — Z6831 Body mass index (BMI) 31.0-31.9, adult: Secondary | ICD-10-CM | POA: Diagnosis not present

## 2015-07-31 LAB — RESULTS CONSOLE HPV: CHL HPV: NEGATIVE

## 2015-07-31 LAB — HM PAP SMEAR: HM Pap smear: NEGATIVE

## 2015-08-14 ENCOUNTER — Other Ambulatory Visit (INDEPENDENT_AMBULATORY_CARE_PROVIDER_SITE_OTHER): Payer: Federal, State, Local not specified - PPO

## 2015-08-14 DIAGNOSIS — E119 Type 2 diabetes mellitus without complications: Secondary | ICD-10-CM | POA: Diagnosis not present

## 2015-08-14 DIAGNOSIS — R945 Abnormal results of liver function studies: Principal | ICD-10-CM

## 2015-08-14 DIAGNOSIS — R7989 Other specified abnormal findings of blood chemistry: Secondary | ICD-10-CM | POA: Diagnosis not present

## 2015-08-14 DIAGNOSIS — I10 Essential (primary) hypertension: Secondary | ICD-10-CM | POA: Diagnosis not present

## 2015-08-14 LAB — IBC PANEL
Iron: 87 ug/dL (ref 42–145)
Saturation Ratios: 27.1 % (ref 20.0–50.0)
Transferrin: 229 mg/dL (ref 212.0–360.0)

## 2015-08-14 LAB — BILIRUBIN, TOTAL: Total Bilirubin: 0.7 mg/dL (ref 0.2–1.2)

## 2015-08-14 LAB — FERRITIN: Ferritin: 376.5 ng/mL — ABNORMAL HIGH (ref 10.0–291.0)

## 2015-08-14 LAB — ALT: ALT: 166 U/L — ABNORMAL HIGH (ref 0–35)

## 2015-08-14 LAB — ALKALINE PHOSPHATASE: Alkaline Phosphatase: 88 U/L (ref 39–117)

## 2015-08-14 LAB — AST: AST: 135 U/L — ABNORMAL HIGH (ref 0–37)

## 2015-08-20 DIAGNOSIS — E119 Type 2 diabetes mellitus without complications: Secondary | ICD-10-CM | POA: Diagnosis not present

## 2015-08-20 DIAGNOSIS — E7800 Pure hypercholesterolemia, unspecified: Secondary | ICD-10-CM | POA: Diagnosis not present

## 2015-08-20 DIAGNOSIS — I10 Essential (primary) hypertension: Secondary | ICD-10-CM | POA: Diagnosis not present

## 2015-09-01 ENCOUNTER — Ambulatory Visit (INDEPENDENT_AMBULATORY_CARE_PROVIDER_SITE_OTHER): Payer: Federal, State, Local not specified - PPO | Admitting: Gastroenterology

## 2015-09-01 ENCOUNTER — Ambulatory Visit: Payer: Federal, State, Local not specified - PPO | Admitting: Gastroenterology

## 2015-09-01 ENCOUNTER — Encounter: Payer: Self-pay | Admitting: Gastroenterology

## 2015-09-01 VITALS — BP 120/78 | HR 80

## 2015-09-01 DIAGNOSIS — K760 Fatty (change of) liver, not elsewhere classified: Secondary | ICD-10-CM

## 2015-09-01 DIAGNOSIS — R748 Abnormal levels of other serum enzymes: Secondary | ICD-10-CM | POA: Diagnosis not present

## 2015-09-01 NOTE — Progress Notes (Signed)
Vicki [name]  Chief Complaint: Abnormal LFTs  Subjective History:  See last office consult [name] for details. I did receive further labs from her referring physician, the indicated negative lab workup for autoimmune hepatitis, negative hemachromatosis gene testing. Although Vicki [name] only reports 2 alcoholic drinks on weekend nights, I have my nurse advise her to become abstinent from alcohol altogether and then recheck lab work. Vicki [name] tells me today that she did not stop alcohol as advised. She is still bothered by PVCs and the fact that she previously had a low magnesium and potassium that is unexplained. ROS: Cardiovascular:  no chest pain. Positive for palpitations Respiratory: no dyspnea  The patient's Past Medical, Family and Social History were reviewed and are on file in the EMR.  Objective:  Med list reviewed  Vital signs in last 24 hrs: Filed Vitals:   09/01/15 1544  BP: 120/78  Pulse: 80    Physical Exam   HEENT: sclera anicteric, oral mucosa moist without lesions  Neck: supple, no thyromegaly, JVD or lymphadenopathy  Cardiac: RRR without murmurs, S1S2 heard, no peripheral edema  Pulm: clear to auscultation bilaterally, normal RR and effort noted  Abdomen: soft, No tenderness, with active bowel sounds. Her liver edge felt 2 fingerbreadths below the costal margin, similar to her previous exam.  Skin; warm and dry, no jaundice or rash  Recent Labs:  CMP Latest Ref Rng 08/14/2015 04/11/2015  Glucose 65 - 99 mg/dL - 213(H)  BUN 6 - 20 mg/dL - 16  Creatinine 0.44 - 1.00 mg/dL - 0.90  Sodium 135 - 145 mmol/L - 138  Potassium 3.5 - 5.1 mmol/L - 3.6  Chloride 101 - 111 mmol/L - 100(L)  CO2 22 - 32 mmol/L - 24  Calcium 8.9 - 10.3 mg/dL - 10.2  Total Bilirubin 0.2 - 1.2 mg/dL 0.7 -  Alkaline Phos 39 - 117 U/L 88 -  AST 0 - 37 U/L 135(H) -  ALT 0 - 35 U/L 166(H) -    Iron saturation 27% ferritin= 376 (down from over 800 when measured by her referring  physician)    @ASSESSMENTPLANBEGIN @ Assessment: Encounter Diagnoses  Name Primary?  . Elevated liver enzymes Yes  . Fatty liver disease, nonalcoholic    Abnormal LFTs appear to be fatty liver. Even though it is probably nonalcoholic, I suspect she may consume more alcohol than she is reporting. In any case, her LFTs are much improved, and the ferritin is down by more than half from the original level. I think she has steatohepatitis, and the prominent inflammatory response is likely causing the elevated LFTs and ferritin. She does not appear to have hemachromatosis, and I am not currently planning a liver biopsy.  Plan: We will recall her for a recheck of LFTs and iron studies in 3 months. I have again advised her to cut down alcohol use. Over half of the 25 minute encounter was spent in Reviewing outside labs, counseling and coordination of care.   Topics discussed: Causes of elevated LFTs and the contribution of alcohol use.   Vicki [name]  CC:  Andres Ege.D.

## 2015-09-01 NOTE — Patient Instructions (Addendum)
We will plan for you to have labs checked by our clinic in 3 months.  Your physician has requested that you go to the basement for the following lab work 12/02/2015: Hepatic function, IBC, ferritin  If you are age 56 or older, your body mass index should be between 23-30. Your There is no weight on file to calculate BMI. If this is out of the aforementioned range listed, please consider follow up with your Primary Care Provider.  If you are age 27 or younger, your body mass index should be between 19-25. Your There is no weight on file to calculate BMI. If this is out of the aformentioned range listed, please consider follow up with your Primary Care Provider.

## 2015-09-18 ENCOUNTER — Telehealth: Payer: Self-pay | Admitting: Cardiovascular Disease

## 2015-09-18 NOTE — Telephone Encounter (Signed)
New message      The pt wants her magnesium level and potassium level checked when she sees Dr. Gwenlyn Found on July 21 st at 2pm if possible.

## 2015-09-18 NOTE — Telephone Encounter (Signed)
Message routed to Anderson Malta, RN to advise on labs - didn't see where MD had ordered these previously

## 2015-09-18 NOTE — Telephone Encounter (Signed)
Returned call to patient. She said she was in the hospital in February 2017 for low magnesium and potassium and had not had those levels checked since then. She wondered if Dr Gwenlyn Found would do that. I advised patient to call her PCP as they would most likely to be the ones to manage it longterm but she was welcome to ask Dr Gwenlyn Found about it at her upcoming appt. She said she had thought about calling her PCP already and said she would start there.

## 2015-09-25 ENCOUNTER — Encounter: Payer: Self-pay | Admitting: *Deleted

## 2015-09-25 ENCOUNTER — Encounter: Payer: Self-pay | Admitting: Cardiovascular Disease

## 2015-09-25 ENCOUNTER — Ambulatory Visit (INDEPENDENT_AMBULATORY_CARE_PROVIDER_SITE_OTHER): Payer: Federal, State, Local not specified - PPO | Admitting: Cardiovascular Disease

## 2015-09-25 VITALS — BP 100/68 | HR 72 | Ht 62.0 in | Wt 162.0 lb

## 2015-09-25 DIAGNOSIS — R748 Abnormal levels of other serum enzymes: Secondary | ICD-10-CM | POA: Diagnosis not present

## 2015-09-25 DIAGNOSIS — Z79899 Other long term (current) drug therapy: Secondary | ICD-10-CM | POA: Diagnosis not present

## 2015-09-25 DIAGNOSIS — R002 Palpitations: Secondary | ICD-10-CM | POA: Diagnosis not present

## 2015-09-25 NOTE — Progress Notes (Signed)
09/25/2015 Vicki [name]   1960-06-11  [id number]  Primary Physician Vicki Gravel, MD Primary Cardiologist: Lorretta Harp MD Renae Gloss  HPI:  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 Maudie [name] for cardiac evaluation because of new onset palpitations. I last saw her in the office 06/10/15. Factors include treated hypertension and diabetes. Her mother did have a myocardial infarction at age 60. She has never had a heart attack or stroke and denies chest pain or shortness of breath. She is in her perimenopause time window is followed by Dr. Dellis [name]. She drinks 2 cups of caffeinated beverages a day as well as alcohol on the weekends. She is not under a lot of stress recently. A 2-D echocardiogram was normal and an event monitor showed sinus rhythm sinus/sinus tachycardia and frequent PVCs. She has decreased her caffeine intake still gets occasional palpitations.   Current Outpatient Prescriptions  Medication Sig Dispense Refill  . acetaminophen (TYLENOL) 500 MG tablet Take 500 mg by mouth as needed for pain.    [name] BENICAR HCT 40-25 MG per tablet Take 0.5 tablets by mouth daily. Half tab daily    . Calcium Carbonate-Simethicone (MAALOX MAX PO) Take by mouth as needed.    . cholecalciferol (VITAMIN D) 1000 UNITS tablet Take 2,000 Units by mouth daily.     . fexofenadine (ALLEGRA) 180 MG tablet Take 180 mg by mouth daily.    . Garlic 7829 MG CAPS Take 1 capsule by mouth daily. OFF AND ON    . metFORMIN (GLUCOPHAGE) 500 MG tablet Take 500 mg by mouth 3 (three) times daily.     . Omega-3 Fatty Acids (FISH OIL) 1000 MG CAPS Take by mouth.    . ONE TOUCH ULTRA TEST test strip     . Turmeric 500 MG CAPS Take by mouth.     No current facility-administered medications for this visit.    Allergies  Allergen Reactions  . Sulfa Antibiotics Itching    Social History   Social History  .  Marital Status: Single    Spouse Name: N/A  . Number of Children: N/A  . Years of Education: N/A   Occupational History  . Not on file.   Social History Main Topics  . Smoking status: Never Smoker   . Smokeless tobacco: Never Used  . Alcohol Use: 1.8 oz/week    3 Standard drinks or equivalent per week  . Drug Use: No  . Sexual Activity: Yes    Birth Control/ Protection: Condom   Other Topics Concern  . Not on file   Social History Narrative   Epworth Sleepiness Scale = 3 (as of 04/15/2015)     Review of Systems: General: negative for chills, fever, night sweats or weight changes.  Cardiovascular: negative for chest pain, dyspnea on exertion, edema, orthopnea, palpitations, paroxysmal nocturnal dyspnea or shortness of breath Dermatological: negative for rash Respiratory: negative for cough or wheezing Urologic: negative for hematuria Abdominal: negative for nausea, vomiting, diarrhea, bright red blood per rectum, melena, or hematemesis Neurologic: negative for visual changes, syncope, or dizziness All other systems reviewed and are otherwise negative except as noted above.    Blood pressure 100/68, pulse 72, height 5' 2"  (1.575 m), weight 162 lb (73.483 kg), last menstrual period 04/30/2012.  General appearance: alert and no distress Neck: no adenopathy, no carotid bruit, no JVD, supple, symmetrical, trachea midline and  thyroid not enlarged, symmetric, no tenderness/mass/nodules Lungs: clear to auscultation bilaterally Heart: regular rate and rhythm, S1, S2 normal, no murmur, click, rub or gallop Extremities: extremities normal, atraumatic, no cyanosis or edema  EKG normal sinus rhythm at 72 with evidence of septal infarct and low limb voltage with left axis deviation. I personally reviewed this EKG  ASSESSMENT AND PLAN:   Palpitations The patient returns for follow-up of palpitations and PVCs. She has decreased her caffeine intake. She does get occasional PVCs especially  at night. She is not symptomatic enough at this point that she wants to be placed on low-dose beta blocker. We'll check some routine labs on her and I will see her back in 6 months      Lorretta Harp MD [facility], [facility] 09/25/2015 2:48 PM

## 2015-09-25 NOTE — Assessment & Plan Note (Signed)
The patient returns for follow-up of palpitations and PVCs. She has decreased her caffeine intake. She does get occasional PVCs especially at night. She is not symptomatic enough at this point that she wants to be placed on low-dose beta blocker. We'll check some routine labs on her and I will see her back in 6 months

## 2015-09-25 NOTE — Patient Instructions (Signed)
Medication Instructions:  Your physician recommends that you continue on your current medications as directed. Please refer to the Current Medication list given to you today.   Labwork: Your physician recommends that you return for lab work in: [place]. The lab can be found on the FIRST FLOOR of out building in [address]   Testing/Procedures: N/A  Follow-Up: Your physician wants you to follow-up in: [place]. You will receive a reminder letter in the mail two months in advance. If you don't receive a letter, please call our office to schedule the follow-up appointment.  If you need a refill on your cardiac medications before your next appointment, please call your pharmacy.

## 2015-09-26 LAB — BASIC METABOLIC PANEL
BUN: 9 mg/dL (ref 7–25)
CO2: 26 mmol/L (ref 20–31)
Calcium: 9.6 mg/dL (ref 8.6–10.4)
Chloride: 105 mmol/L (ref 98–110)
Creat: 0.7 mg/dL (ref 0.50–1.05)
Glucose, Bld: 99 mg/dL (ref 65–99)
Potassium: 4 mmol/L (ref 3.5–5.3)
Sodium: 141 mmol/L (ref 135–146)

## 2015-09-26 LAB — MAGNESIUM: Magnesium: 1.8 mg/dL (ref 1.5–2.5)

## 2015-09-29 NOTE — Addendum Note (Signed)
Addended by: Cristopher Estimable on: 09/29/2015 04:11 PM   Modules accepted: Orders

## 2015-10-06 ENCOUNTER — Encounter: Payer: Self-pay | Admitting: Podiatry

## 2015-10-06 ENCOUNTER — Ambulatory Visit (INDEPENDENT_AMBULATORY_CARE_PROVIDER_SITE_OTHER): Payer: Federal, State, Local not specified - PPO | Admitting: Podiatry

## 2015-10-06 VITALS — BP 129/76 | HR 71

## 2015-10-06 DIAGNOSIS — H109 Unspecified conjunctivitis: Secondary | ICD-10-CM | POA: Diagnosis not present

## 2015-10-06 DIAGNOSIS — H10502 Unspecified blepharoconjunctivitis, left eye: Secondary | ICD-10-CM | POA: Diagnosis not present

## 2015-10-06 DIAGNOSIS — B353 Tinea pedis: Secondary | ICD-10-CM | POA: Diagnosis not present

## 2015-10-06 DIAGNOSIS — M722 Plantar fascial fibromatosis: Secondary | ICD-10-CM | POA: Diagnosis not present

## 2015-10-06 MED ORDER — TERBINAFINE HCL 250 MG PO TABS: 250.0000 mg | ORAL_TABLET | Freq: Every day | ORAL | 0 refills | Status: DC

## 2015-10-06 NOTE — Progress Notes (Signed)
Subjective: 55 year old female presents complaining of peeling skin in between toes left foot. Also has a palpable mass plantar mid arch right foot with knot that was present since last year. This is also painful after been on feet for 8-10 hours on concrete floor.  Stated that she is using orthotics.  Objective: Palpable nodule along the plantar fascial band right foot painful.  Peeling skin in between digits without open skin left foot.            Assessment: Tinea pedis in between digits left foot. Plantar fibroma right foot painful.   Plan: Left foot plantar fibroma injection offered. Patient wish to pass at this time. Rx Lamisil po x 15 days. Use Vinegar diluted water to soak and wash. May also use Salsun blue shampoo wash.  Continue to wear custom orthotics.  Return if condition gets worse.

## 2015-10-06 NOTE — Patient Instructions (Signed)
Seen for peeling skin lesions in between digits left foot. Antifungal medication prescribed to take one a day for 2 weeks. May soak and wash in diluted Vinegar water. Also cleanse feet after each shower with Salsun blue shampoo.

## 2015-10-13 DIAGNOSIS — K080 Exfoliation of teeth due to systemic causes: Secondary | ICD-10-CM | POA: Diagnosis not present

## 2015-10-19 ENCOUNTER — Ambulatory Visit (INDEPENDENT_AMBULATORY_CARE_PROVIDER_SITE_OTHER): Payer: Federal, State, Local not specified - PPO | Admitting: Sports Medicine

## 2015-10-19 ENCOUNTER — Ambulatory Visit (INDEPENDENT_AMBULATORY_CARE_PROVIDER_SITE_OTHER): Payer: Federal, State, Local not specified - PPO

## 2015-10-19 DIAGNOSIS — M722 Plantar fascial fibromatosis: Secondary | ICD-10-CM

## 2015-10-19 DIAGNOSIS — M79672 Pain in left foot: Secondary | ICD-10-CM

## 2015-10-19 DIAGNOSIS — M79671 Pain in right foot: Secondary | ICD-10-CM | POA: Diagnosis not present

## 2015-10-19 NOTE — Progress Notes (Signed)
   Subjective:    Patient ID: Vicki [name], female    DOB: 03-07-1961, 55 y.o.   MRN: [id number]  HPI    Review of Systems  All other systems reviewed and are negative.      Objective:   Physical Exam        Assessment & Plan:

## 2015-10-19 NOTE — Progress Notes (Signed)
Subjective: Vicki [name] is a 55 y.o.Diabetic female patient presents to office with complaint of heel pain on the right>left and palpable lump in arch on right. Has been seeing Dr. Caffie Pinto with no complete improvement in symptoms. Currently using insoles. Had injection in past that worsened her PVCs and can not take anti-inflammatories. 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 Pain now 2-3/10. Denies any other pedal complaints.   Patient Active Problem List   Diagnosis Date Noted  . Palpitations 04/15/2015  . Controlled type 2 diabetes mellitus without complication ([place]) XX123456  . Elevated liver enzymes 03/31/2014  . Tinea pedis 07/31/2013  . Foot and toe(s), blister, infected 07/31/2013  . 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 Pain in joint, ankle and foot 08/31/2012    Current Outpatient Prescriptions on File Prior to Visit  Medication Sig Dispense Refill  . acetaminophen (TYLENOL) 500 MG tablet Take 500 mg by mouth as needed for pain.    [name] BENICAR HCT 40-25 MG per tablet Take 0.5 tablets by mouth daily. Half tab daily    . Calcium Carbonate-Simethicone (MAALOX MAX PO) Take by mouth as needed.    . cholecalciferol (VITAMIN D) 1000 UNITS tablet Take 2,000 Units by mouth daily.     . fexofenadine (ALLEGRA) 180 MG tablet Take 180 mg by mouth daily.    . Garlic 123XX123 MG CAPS Take 1 capsule by mouth daily. OFF AND ON    . metFORMIN (GLUCOPHAGE) 500 MG tablet Take 500 mg by mouth 3 (three) times daily.     . Omega-3 Fatty Acids (FISH OIL) 1000 MG CAPS Take by mouth.    . ONE TOUCH ULTRA TEST test strip     . terbinafine (LAMISIL) 250 MG tablet Take 1 tablet (250 mg total) by mouth daily. 15 tablet 0  . Turmeric 500 MG CAPS Take by mouth.     No current facility-administered medications on file prior to visit.     Allergies  Allergen Reactions  . Sulfa Antibiotics Itching    Objective: Physical Exam General: The patient is alert and oriented x3 in no  acute distress.  Dermatology: Skin is warm, dry and supple bilateral lower extremities. Nails 1-10 are normal. Focal to mid-arch there is a soft tissue mass in plantar fascia resembling fibroma on right. There is no erythema, edema, no eccymosis, no open lesions present. Integument is otherwise unremarkable.  Vascular: Dorsalis Pedis pulse and Posterior Tibial pulse are 2/4 bilateral. Capillary fill time is immediate to all digits.  Neurological: Grossly intact to light touch with an achilles reflex of +2/5 and a negative Tinel's sign bilateral.  Musculoskeletal: Tenderness to palpation at the medial calcaneal tubercale and through the insertion of the plantar fascia on the right>Left foot especially at fibroma at right arch. No pain with compression of calcaneus bilateral. No pain with tuning fork to calcaneus bilateral. No pain with calf compression bilateral. There is decreased Ankle joint range of motion bilateral. All other joints range of motion within normal limits bilateral. Strength 5/5 in all groups bilateral.   Xray, Right and left foot:  Normal osseous mineralization. Joint spaces preserved. No fracture/dislocation/boney destruction. No Calcaneal spur present with mild thickening of plantar fascia on right. None on left. No other soft tissue abnormalities or radiopaque foreign bodies.   Assessment and Plan: Problem List Items Addressed This Visit      Musculoskeletal and Integument   Plantar fascial fibromatosis - Primary   Relevant Orders   DG  Foot 2 Views Right   DG Foot 2 Views Left    Other Visit Diagnoses    Right foot pain       Left foot pain         -Complete examination performed.  -Xrays reviewed -Discussed with patient in detail the condition of plantar fasciitis with fibroma, how this occurs and general treatment options. Explained both conservative and surgical treatments.  -Patient declined injection due to PVC history -Patient declined oral medication due to  PVC history -Recommended good supportive shoes and advised insoles when she has already. - Explained in detail the use of the fascial brace for right and left which was dispensed at today's visit. -Explained and dispensed to patient daily stretching exercises. -Recommend patient to ice affected area 1-2x daily. -Patient to return to office in 3-4 weeks for follow up or sooner if problems or questions arise.  Landis Martins, DPM

## 2015-10-19 NOTE — Patient Instructions (Signed)
[facility] [employer] [address], [place], [place] [address]   Plantar Fasciitis Plantar fasciitis is a painful foot condition that affects the heel. It occurs when the band of tissue that connects the toes to the heel bone (plantar fascia) becomes irritated. This can happen after exercising too much or doing other repetitive activities (overuse injury). The pain from plantar fasciitis can range from mild irritation to severe pain that makes it difficult for you to walk or move. The pain is usually worse in the morning or after you have been sitting or lying down for a while. CAUSES This condition may be caused by:  Standing for long periods of time.  Wearing shoes that do not fit.  Doing high-impact activities, including running, aerobics, and ballet.  Being overweight.  Having an abnormal way of walking (gait).  Having tight calf muscles.  Having high arches in your feet.  Starting a new athletic activity. SYMPTOMS The main symptom of this condition is heel pain. Other symptoms include:  Pain that gets worse after activity or exercise.  Pain that is worse in the morning or after resting.  Pain that goes away after you walk for a few minutes. DIAGNOSIS This condition may be diagnosed based on your signs and symptoms. Your health care provider will also do a physical exam to check for:  A tender area on the bottom of your foot.  A high arch in your foot.  Pain when you move your foot.  Difficulty moving your foot. You may also need to have imaging studies to confirm the diagnosis. These can include:  X-rays.  Ultrasound.  MRI. TREATMENT  Treatment for plantar fasciitis depends on the severity of the condition. Your treatment may include:  Rest, ice, and over-the-counter pain medicines to manage your pain.  Exercises to stretch your calves and your plantar fascia.  A splint that holds your foot in a stretched, upward position while you sleep (night  splint).  Physical therapy to relieve symptoms and prevent problems in the future.  Cortisone injections to relieve severe pain.  Extracorporeal shock wave therapy (ESWT) to stimulate damaged plantar fascia with electrical impulses. It is often used as a last resort before surgery.  Surgery, if other treatments have not worked after 12 months. HOME CARE INSTRUCTIONS  Take medicines only as directed by your health care provider.  Avoid activities that cause pain.  Roll the bottom of your foot over a bag of ice or a bottle of cold water. Do this for 20 minutes, 3-4 times a day.  Perform simple stretches as directed by your health care provider.  Try wearing athletic shoes with air-sole or gel-sole cushions or soft shoe inserts.  Wear a night splint while sleeping, if directed by your health care provider.  Keep all follow-up appointments with your health care provider. PREVENTION   Do not perform exercises or activities that cause heel pain.  Consider finding low-impact activities if you continue to have problems.  Lose weight if you need to. The best way to prevent plantar fasciitis is to avoid the activities that aggravate your plantar fascia. SEEK MEDICAL CARE IF:  Your symptoms do not go away after treatment with home care measures.  Your pain gets worse.  Your pain affects your ability to move or do your daily activities.   This information is not intended to replace advice given to you by your health care provider. Make sure you discuss any questions you have with your health care  provider.   Document Released: 11/16/2000 Document Revised: 11/12/2014 Document Reviewed: 01/01/2014 Elsevier Interactive Patient Education [employer].

## 2015-10-20 ENCOUNTER — Telehealth: Payer: Self-pay | Admitting: *Deleted

## 2015-10-20 MED ORDER — NONFORMULARY OR COMPOUNDED ITEM: 3 refills | Status: DC

## 2015-10-20 NOTE — Telephone Encounter (Addendum)
-----   Message from Landis Martins, [place] sent at 10/19/2015  2:24 PM EDT ----- Regarding: Topical steroid/pain cream Rx Topical pain/steroid cream from [facility] or [facility] Plantar fasciitis, R>L. -Dr. Cannon Kettle. 10/20/2015-Faxed [facility] Combination Pain Cream order.

## 2015-11-03 DIAGNOSIS — J0100 Acute maxillary sinusitis, unspecified: Secondary | ICD-10-CM | POA: Diagnosis not present

## 2015-11-17 ENCOUNTER — Ambulatory Visit: Payer: Federal, State, Local not specified - PPO | Admitting: Sports Medicine

## 2015-11-19 DIAGNOSIS — J3489 Other specified disorders of nose and nasal sinuses: Secondary | ICD-10-CM | POA: Diagnosis not present

## 2015-11-19 DIAGNOSIS — J309 Allergic rhinitis, unspecified: Secondary | ICD-10-CM | POA: Diagnosis not present

## 2015-11-19 DIAGNOSIS — Z6830 Body mass index (BMI) 30.0-30.9, adult: Secondary | ICD-10-CM | POA: Diagnosis not present

## 2015-11-19 DIAGNOSIS — J0191 Acute recurrent sinusitis, unspecified: Secondary | ICD-10-CM | POA: Diagnosis not present

## 2015-11-23 DIAGNOSIS — E119 Type 2 diabetes mellitus without complications: Secondary | ICD-10-CM | POA: Diagnosis not present

## 2015-11-26 DIAGNOSIS — J0191 Acute recurrent sinusitis, unspecified: Secondary | ICD-10-CM | POA: Diagnosis not present

## 2015-11-26 DIAGNOSIS — J3489 Other specified disorders of nose and nasal sinuses: Secondary | ICD-10-CM | POA: Diagnosis not present

## 2015-11-26 DIAGNOSIS — J341 Cyst and mucocele of nose and nasal sinus: Secondary | ICD-10-CM | POA: Diagnosis not present

## 2015-11-26 DIAGNOSIS — J342 Deviated nasal septum: Secondary | ICD-10-CM | POA: Diagnosis not present

## 2015-12-07 DIAGNOSIS — L818 Other specified disorders of pigmentation: Secondary | ICD-10-CM | POA: Diagnosis not present

## 2015-12-08 ENCOUNTER — Encounter: Payer: Self-pay | Admitting: Sports Medicine

## 2015-12-08 ENCOUNTER — Ambulatory Visit (INDEPENDENT_AMBULATORY_CARE_PROVIDER_SITE_OTHER): Payer: Federal, State, Local not specified - PPO | Admitting: Sports Medicine

## 2015-12-08 DIAGNOSIS — M722 Plantar fascial fibromatosis: Secondary | ICD-10-CM | POA: Diagnosis not present

## 2015-12-08 DIAGNOSIS — M79671 Pain in right foot: Secondary | ICD-10-CM | POA: Diagnosis not present

## 2015-12-08 DIAGNOSIS — M79672 Pain in left foot: Secondary | ICD-10-CM

## 2015-12-08 NOTE — Progress Notes (Signed)
Subjective: Vicki [name] is a 55 y.o.Diabetic female patient returns to office follow up  heel pain on the right>left and palpable lump in arch on right. Patient states that better with plantar fascial braces and states that she got new shoes from [facility] which feel heavy and hard to wear. Denies any other pedal complaints.   Patient Active Problem List   Diagnosis Date Noted  . Palpitations 04/15/2015  . Controlled type 2 diabetes mellitus without complication ([place]) XX123456  . Elevated liver enzymes 03/31/2014  . Tinea pedis 07/31/2013  . Foot and toe(s), blister, infected 07/31/2013  . 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 Pain in joint, ankle and foot 08/31/2012    Current Outpatient Prescriptions on File Prior to Visit  Medication Sig Dispense Refill  . acetaminophen (TYLENOL) 500 MG tablet Take 500 mg by mouth as needed for pain.    [name] BENICAR HCT 40-25 MG per tablet Take 0.5 tablets by mouth daily. Half tab daily    . Calcium Carbonate-Simethicone (MAALOX MAX PO) Take by mouth as needed.    . cholecalciferol (VITAMIN D) 1000 UNITS tablet Take 2,000 Units by mouth daily.     . fexofenadine (ALLEGRA) 180 MG tablet Take 180 mg by mouth daily.    . Garlic 123XX123 MG CAPS Take 1 capsule by mouth daily. OFF AND ON    . metFORMIN (GLUCOPHAGE) 500 MG tablet Take 500 mg by mouth 3 (three) times daily.     . NONFORMULARY OR COMPOUNDED ITEM [facility]:  Combination Pain Cream - Baclofen 2%, Doxepin 5%, Gabapentin 6%, Topora,ate 2%, Pentoxifylline 3%, apply 1-2 grams to affected area 3-4 times daily. 120 each 3  . Omega-3 Fatty Acids (FISH OIL) 1000 MG CAPS Take by mouth.    . ONE TOUCH ULTRA TEST test strip     . terbinafine (LAMISIL) 250 MG tablet Take 1 tablet (250 mg total) by mouth daily. 15 tablet 0  . Turmeric 500 MG CAPS Take by mouth.     No current facility-administered medications on file prior to visit.     Allergies  Allergen Reactions  . Sulfa Antibiotics  Itching  . Sulfasalazine Itching    Objective: Physical Exam General: The patient is alert and oriented x3 in no acute distress.  Dermatology: Skin is warm, dry and supple bilateral lower extremities. Nails 1-10 are normal. Focal to mid-arch there is a soft tissue mass in plantar fascia resembling fibroma on right. There is no erythema, edema, no eccymosis, no open lesions present. Integument is otherwise unremarkable.  Vascular: Dorsalis Pedis pulse and Posterior Tibial pulse are 2/4 bilateral. Capillary fill time is immediate to all digits.  Neurological: Grossly intact to light touch with an achilles reflex of +2/5 and a negative Tinel's sign bilateral.  Musculoskeletal: Mild tenderness to palpation at the medial calcaneal tubercale and through the insertion of the plantar fascia on the right>Left foot especially at fibroma at right arch. No pain with compression of calcaneus bilateral. No pain with tuning fork to calcaneus bilateral. No pain with calf compression bilateral. There is decreased Ankle joint range of motion bilateral. All other joints range of motion within normal limits bilateral. Strength 5/5 in all groups bilateral.   Assessment and Plan: Problem List Items Addressed This Visit      Musculoskeletal and Integument   Plantar fascial fibromatosis - Primary    Other Visit Diagnoses    Right foot pain       Left foot pain         -  Complete examination performed.  -Discussed with patient in detail the condition of plantar fasciitis with fibroma, how this occurs and general treatment options. Explained both conservative and surgical treatments.  -Patient continues to decline injection due to PVC history -Patient continues to decline oral medication due to PVC history -Recommended good supportive shoes and advised insoles when she has already. Encourage patient to return shoes to [facility] if they are uncomfortable - Continue with fascial braces -Rx PT with treatment  modalities and cont daily stretching exercises. -Recommend cont to ice affected area 1-2x daily. -Patient to return to office in 4-6 weeks for follow up after starting PT or sooner if problems or questions arise.  Landis Martins, DPM

## 2015-12-09 ENCOUNTER — Encounter (HOSPITAL_COMMUNITY): Payer: Self-pay | Admitting: Emergency Medicine

## 2015-12-09 ENCOUNTER — Ambulatory Visit (HOSPITAL_COMMUNITY)
Admission: EM | Admit: 2015-12-09 | Discharge: 2015-12-09 | Disposition: A | Discharge status: Home / Self Care | Payer: Federal, State, Local not specified - PPO | Attending: Family Medicine | Admitting: Family Medicine

## 2015-12-09 ENCOUNTER — Telehealth: Payer: Self-pay | Admitting: *Deleted

## 2015-12-09 DIAGNOSIS — M722 Plantar fascial fibromatosis: Secondary | ICD-10-CM

## 2015-12-09 DIAGNOSIS — W57XXXA Bitten or stung by nonvenomous insect and other nonvenomous arthropods, initial encounter: Secondary | ICD-10-CM

## 2015-12-09 DIAGNOSIS — L03115 Cellulitis of right lower limb: Secondary | ICD-10-CM

## 2015-12-09 MED ORDER — CLINDAMYCIN HCL 300 MG PO CAPS: 300.0000 mg | ORAL_CAPSULE | Freq: Three times a day (TID) | ORAL | 0 refills | Status: AC

## 2015-12-09 NOTE — Telephone Encounter (Signed)
Entered in error

## 2015-12-09 NOTE — ED Triage Notes (Signed)
Patient noticed large, red, painful, itchy bite on leg yesterday.  Bump located on right lower leg.  Today noticed same type of bump on left upper arm.

## 2015-12-09 NOTE — Discharge Instructions (Signed)
Apply hot compresses, or soak in epsom salt water.   If not getting better, may require incision and drainage of one or both lesions.

## 2015-12-09 NOTE — ED Provider Notes (Signed)
CSN: [id number]     Arrival date & time 12/09/15  1522 History   First MD Initiated Contact with Patient 12/09/15 1647     Chief Complaint  Patient presents with  . Insect Bite   (Consider location/radiation/quality/duration/timing/severity/associated sxs/prior Treatment) HPI This is a new problem. Patient is a 55 year old female who states that she awoke from sleep this morning to find an insect bite to her right calf and her left upper arm she states that the bite on the right cath is very sore swollen and warm to touch. She states that she has had a previous insect bite of this nature on the left hip a couple years ago. She does not recall with the treatment was for that when she does have a history of diabetes. She states her pain score is about 4 at this time and there is a family history of hypertension and diabetes. Home treatment has been nothing. Past Medical History:  Diagnosis Date  . Allergy   . Boil   . Diabetes ([place])    type 2  . Elevated liver enzymes   . Fatty liver   . Hyperlipemia   . Hypertension   . 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 PVC's (premature ventricular contractions)    Past Surgical History:  Procedure Laterality Date  . COLONOSCOPY  2014  . ECTOPIC PREGNANCY SURGERY  1990  . TONSILLECTOMY  1980   Family History  Problem Relation Age of Onset  . Hypertension Mother   . Diabetes Mother   . Heart attack Mother   . Hypertension Father   . Heart attack Sister 30  . Diabetes Sister   . Hypertension Sister   . Goiter    . Asthma    . Colon cancer Neg Hx    Social History  Substance Use Topics  . Smoking status: Never Smoker  . Smokeless tobacco: Never Used  . Alcohol use 1.8 oz/week    3 Standard drinks or equivalent per week   OB History    No data available     Review of Systems  Denies: HEADACHE, NAUSEA, ABDOMINAL PAIN, CHEST PAIN, CONGESTION, DYSURIA, SHORTNESS OF BREATH  Allergies  Sulfa antibiotics and  Sulfasalazine  Home Medications   Prior to Admission medications   Medication Sig Start Date End Date Taking? Authorizing Provider  acetaminophen (TYLENOL) 500 MG tablet Take 500 mg by mouth as needed for pain.    Historical Provider, MD  BENICAR HCT 40-25 MG per tablet Take 0.5 tablets by mouth daily. Half tab daily 09/02/12   Historical Provider, MD  Calcium Carbonate-Simethicone (MAALOX MAX PO) Take by mouth as needed.    Historical Provider, MD  cholecalciferol (VITAMIN D) 1000 UNITS tablet Take 2,000 Units by mouth daily.     Historical Provider, MD  clindamycin (CLEOCIN) 300 MG capsule Take 1 capsule (300 mg total) by mouth 3 (three) times daily. 12/09/15 12/16/15  Konrad Felix, PA  fexofenadine (ALLEGRA) 180 MG tablet Take 180 mg by mouth daily.    Historical Provider, MD  Garlic 7035 MG CAPS Take 1 capsule by mouth daily. OFF AND ON    Historical Provider, MD  metFORMIN (GLUCOPHAGE) 500 MG tablet Take 500 mg by mouth 3 (three) times daily.     Historical Provider, MD  NONFORMULARY OR COMPOUNDED [place]:  Combination Pain Cream - Baclofen 2%, Doxepin 5%, Gabapentin 6%, Topora,ate 2%, Pentoxifylline 3%, apply 1-2 grams to affected area 3-4 times daily.  10/20/15   Landis Martins, DPM  Omega-3 Fatty Acids (FISH OIL) 1000 MG CAPS Take by mouth.    Historical Provider, MD  ONE TOUCH ULTRA TEST test strip  05/14/12   Historical Provider, MD  terbinafine (LAMISIL) 250 MG tablet Take 1 tablet (250 mg total) by mouth daily. 10/06/15   Myeong O Sheard, DPM  Turmeric 500 MG CAPS Take by mouth.    Historical Provider, MD   Meds Ordered and Administered this Visit  Medications - No data to display  BP 122/73 (BP Location: Left Arm)   Pulse 95   Temp 97.8 F (36.6 C) (Oral)   Resp 18   LMP 04/30/2012   SpO2 99%  No data found.   Physical Exam NURSES NOTES AND VITAL SIGNS REVIEWED. CONSTITUTIONAL: Well developed, well nourished, no acute distress HEENT: normocephalic,  atraumatic EYES: Conjunctiva normal NECK:normal ROM, supple, no adenopathy PULMONARY:No respiratory distress, normal effort ABDOMINAL: Soft, ND, NT BS+, No CVAT MUSCULOSKELETAL: Normal ROM of all extremities, right lower leg there is a red swollen tender one and a half centimeter diameter lesion. There is no lymphangitic streaking. The lesion on the left upper arm is swollen minimal tenderness normal warmth to the touch. SKIN: warm and dry without rash PSYCHIATRIC: Mood and affect, behavior are normal  Urgent Care Course   Clinical Course    Procedures (including critical care time)  Labs Review Labs Reviewed - No data to display  Imaging Review No results found.   Visual Acuity Review  Right Eye Distance:   Left Eye Distance:   Bilateral Distance:    Right Eye Near:   Left Eye Near:    Bilateral Near:       Treatment options are discussed with the patient and she declines incision and drainage at this time. Face that she would prefer antibiotics and will follow-up if symptoms get worse. She is advised that she is diabetic and is more prone to worsening of infections. Patient states that she will definitely return if there is no improvements in her symptoms over the next couple of days. Prescription for clindamycin is provided because patient is allergic to sulfa medications.  MDM   1. Insect bite, initial encounter   2. Cellulitis of right lower extremity     Patient is reassured that there are no issues that require transfer to higher level of care at this time or additional tests. Patient is advised to continue home symptomatic treatment. Patient is advised that if there are new or worsening symptoms to attend the emergency department, contact primary care provider, or return to UC. Instructions of care provided discharged home in stable condition.    THIS NOTE WAS GENERATED USING A VOICE RECOGNITION SOFTWARE PROGRAM. ALL REASONABLE EFFORTS  WERE MADE TO PROOFREAD  THIS DOCUMENT FOR ACCURACY.  I have verbally reviewed the discharge instructions with the patient. A printed AVS was given to the patient.  All questions were answered prior to discharge.      Konrad Felix, [place] 12/09/15 [place]

## 2015-12-09 NOTE — Telephone Encounter (Addendum)
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 12/09/2015-PT orders faxed to [facility] [phone], fax [phone].

## 2015-12-10 DIAGNOSIS — E119 Type 2 diabetes mellitus without complications: Secondary | ICD-10-CM | POA: Diagnosis not present

## 2015-12-10 DIAGNOSIS — I10 Essential (primary) hypertension: Secondary | ICD-10-CM | POA: Diagnosis not present

## 2015-12-14 DIAGNOSIS — I10 Essential (primary) hypertension: Secondary | ICD-10-CM | POA: Diagnosis not present

## 2015-12-14 DIAGNOSIS — R002 Palpitations: Secondary | ICD-10-CM | POA: Diagnosis not present

## 2015-12-14 DIAGNOSIS — E119 Type 2 diabetes mellitus without complications: Secondary | ICD-10-CM | POA: Diagnosis not present

## 2016-01-01 DIAGNOSIS — I10 Essential (primary) hypertension: Secondary | ICD-10-CM | POA: Diagnosis not present

## 2016-01-06 DIAGNOSIS — I10 Essential (primary) hypertension: Secondary | ICD-10-CM | POA: Diagnosis not present

## 2016-01-06 DIAGNOSIS — E7800 Pure hypercholesterolemia, unspecified: Secondary | ICD-10-CM | POA: Diagnosis not present

## 2016-01-06 DIAGNOSIS — E119 Type 2 diabetes mellitus without complications: Secondary | ICD-10-CM | POA: Diagnosis not present

## 2016-01-06 DIAGNOSIS — K7689 Other specified diseases of liver: Secondary | ICD-10-CM | POA: Diagnosis not present

## 2016-01-18 DIAGNOSIS — R21 Rash and other nonspecific skin eruption: Secondary | ICD-10-CM | POA: Diagnosis not present

## 2016-02-24 ENCOUNTER — Other Ambulatory Visit: Payer: Self-pay

## 2016-02-26 DIAGNOSIS — G514 Facial myokymia: Secondary | ICD-10-CM | POA: Diagnosis not present

## 2016-02-26 DIAGNOSIS — R202 Paresthesia of skin: Secondary | ICD-10-CM | POA: Diagnosis not present

## 2016-02-26 DIAGNOSIS — R51 Headache: Secondary | ICD-10-CM | POA: Diagnosis not present

## 2016-02-26 DIAGNOSIS — G4485 Primary stabbing headache: Secondary | ICD-10-CM | POA: Diagnosis not present

## 2016-03-01 ENCOUNTER — Other Ambulatory Visit (HOSPITAL_COMMUNITY): Payer: Self-pay | Admitting: Physician Assistant

## 2016-03-01 DIAGNOSIS — G514 Facial myokymia: Secondary | ICD-10-CM

## 2016-03-01 DIAGNOSIS — G4485 Primary stabbing headache: Secondary | ICD-10-CM

## 2016-03-01 DIAGNOSIS — R519 Headache, unspecified: Secondary | ICD-10-CM

## 2016-03-01 DIAGNOSIS — R51 Headache: Principal | ICD-10-CM

## 2016-03-02 DIAGNOSIS — I10 Essential (primary) hypertension: Secondary | ICD-10-CM | POA: Diagnosis not present

## 2016-03-02 DIAGNOSIS — E119 Type 2 diabetes mellitus without complications: Secondary | ICD-10-CM | POA: Diagnosis not present

## 2016-03-08 ENCOUNTER — Ambulatory Visit (HOSPITAL_COMMUNITY)
Admission: RE | Admit: 2016-03-08 | Discharge: 2016-03-08 | Disposition: A | Discharge status: Home / Self Care | Payer: Federal, State, Local not specified - PPO | Source: Ambulatory Visit | Attending: Physician Assistant | Admitting: Physician Assistant

## 2016-03-08 DIAGNOSIS — K7689 Other specified diseases of liver: Secondary | ICD-10-CM | POA: Diagnosis not present

## 2016-03-08 DIAGNOSIS — R519 Headache, unspecified: Secondary | ICD-10-CM

## 2016-03-08 DIAGNOSIS — E7800 Pure hypercholesterolemia, unspecified: Secondary | ICD-10-CM | POA: Diagnosis not present

## 2016-03-08 DIAGNOSIS — G4485 Primary stabbing headache: Secondary | ICD-10-CM

## 2016-03-08 DIAGNOSIS — G514 Facial myokymia: Secondary | ICD-10-CM

## 2016-03-08 DIAGNOSIS — E119 Type 2 diabetes mellitus without complications: Secondary | ICD-10-CM | POA: Diagnosis not present

## 2016-03-08 DIAGNOSIS — I10 Essential (primary) hypertension: Secondary | ICD-10-CM | POA: Diagnosis not present

## 2016-03-08 DIAGNOSIS — R51 Headache: Secondary | ICD-10-CM | POA: Diagnosis not present

## 2016-03-08 LAB — POCT I-STAT CREATININE: Creatinine, Ser: 0.7 mg/dL (ref 0.44–1.00)

## 2016-03-18 DIAGNOSIS — D229 Melanocytic nevi, unspecified: Secondary | ICD-10-CM | POA: Diagnosis not present

## 2016-03-18 DIAGNOSIS — L821 Other seborrheic keratosis: Secondary | ICD-10-CM | POA: Diagnosis not present

## 2016-03-31 DIAGNOSIS — K080 Exfoliation of teeth due to systemic causes: Secondary | ICD-10-CM | POA: Diagnosis not present

## 2016-04-01 DIAGNOSIS — I10 Essential (primary) hypertension: Secondary | ICD-10-CM | POA: Diagnosis not present

## 2016-04-01 DIAGNOSIS — K7689 Other specified diseases of liver: Secondary | ICD-10-CM | POA: Diagnosis not present

## 2016-04-02 DIAGNOSIS — K080 Exfoliation of teeth due to systemic causes: Secondary | ICD-10-CM | POA: Diagnosis not present

## 2016-04-05 DIAGNOSIS — E119 Type 2 diabetes mellitus without complications: Secondary | ICD-10-CM | POA: Diagnosis not present

## 2016-04-05 DIAGNOSIS — K7689 Other specified diseases of liver: Secondary | ICD-10-CM | POA: Diagnosis not present

## 2016-04-08 ENCOUNTER — Ambulatory Visit (HOSPITAL_COMMUNITY): Admission: RE | Admit: 2016-04-08 | Payer: Federal, State, Local not specified - PPO | Source: Ambulatory Visit

## 2016-04-22 DIAGNOSIS — K7689 Other specified diseases of liver: Secondary | ICD-10-CM | POA: Diagnosis not present

## 2016-04-29 DIAGNOSIS — Z6829 Body mass index (BMI) 29.0-29.9, adult: Secondary | ICD-10-CM | POA: Diagnosis not present

## 2016-04-29 DIAGNOSIS — K118 Other diseases of salivary glands: Secondary | ICD-10-CM | POA: Diagnosis not present

## 2016-04-29 LAB — HM HIV SCREENING LAB: HM HIV Screening: NEGATIVE

## 2016-05-13 DIAGNOSIS — R51 Headache: Secondary | ICD-10-CM | POA: Diagnosis not present

## 2016-05-13 DIAGNOSIS — G514 Facial myokymia: Secondary | ICD-10-CM | POA: Diagnosis not present

## 2016-05-13 DIAGNOSIS — G4485 Primary stabbing headache: Secondary | ICD-10-CM | POA: Diagnosis not present

## 2016-06-01 DIAGNOSIS — E6609 Other obesity due to excess calories: Secondary | ICD-10-CM | POA: Diagnosis not present

## 2016-06-01 DIAGNOSIS — R748 Abnormal levels of other serum enzymes: Secondary | ICD-10-CM | POA: Diagnosis not present

## 2016-06-01 DIAGNOSIS — K760 Fatty (change of) liver, not elsewhere classified: Secondary | ICD-10-CM | POA: Diagnosis not present

## 2016-06-01 DIAGNOSIS — R7989 Other specified abnormal findings of blood chemistry: Secondary | ICD-10-CM | POA: Diagnosis not present

## 2016-06-02 DIAGNOSIS — E119 Type 2 diabetes mellitus without complications: Secondary | ICD-10-CM | POA: Diagnosis not present

## 2016-06-09 DIAGNOSIS — I10 Essential (primary) hypertension: Secondary | ICD-10-CM | POA: Diagnosis not present

## 2016-06-09 DIAGNOSIS — E7800 Pure hypercholesterolemia, unspecified: Secondary | ICD-10-CM | POA: Diagnosis not present

## 2016-06-09 DIAGNOSIS — E119 Type 2 diabetes mellitus without complications: Secondary | ICD-10-CM | POA: Diagnosis not present

## 2016-06-13 DIAGNOSIS — J309 Allergic rhinitis, unspecified: Secondary | ICD-10-CM | POA: Diagnosis not present

## 2016-06-13 DIAGNOSIS — Z6829 Body mass index (BMI) 29.0-29.9, adult: Secondary | ICD-10-CM | POA: Diagnosis not present

## 2016-06-13 DIAGNOSIS — K118 Other diseases of salivary glands: Secondary | ICD-10-CM | POA: Diagnosis not present

## 2016-07-13 ENCOUNTER — Other Ambulatory Visit: Payer: Self-pay | Admitting: Internal Medicine

## 2016-07-13 ENCOUNTER — Ambulatory Visit (INDEPENDENT_AMBULATORY_CARE_PROVIDER_SITE_OTHER): Payer: Federal, State, Local not specified - PPO | Admitting: Obstetrics & Gynecology

## 2016-07-13 ENCOUNTER — Encounter: Payer: Self-pay | Admitting: Obstetrics & Gynecology

## 2016-07-13 VITALS — BP 120/82 | Ht 62.0 in | Wt 161.0 lb

## 2016-07-13 DIAGNOSIS — L738 Other specified follicular disorders: Secondary | ICD-10-CM

## 2016-07-13 DIAGNOSIS — R945 Abnormal results of liver function studies: Secondary | ICD-10-CM

## 2016-07-13 DIAGNOSIS — R7989 Other specified abnormal findings of blood chemistry: Secondary | ICD-10-CM

## 2016-07-13 NOTE — Progress Notes (Signed)
    Vicki [name] 11-Jan-1961 [id number]        56 y.o.  G1P1A1L1  RP:  "Bumps" on vulva  C/O bumps coming and going on vulva and towards Lt inguinal area x the last year.  Mildly tender.  Occasional drainage. Menopause.  No HRT, but using Vagifem 2x a week for Atrophic Vaginitis.  Improved Sxs on it.  No PMB.  No pelvic pain.  No vaginal d/c.  Past medical history,surgical history, problem list, medications, allergies, family history and social history were all reviewed and documented in the EPIC chart.  Directed ROS with pertinent positives and negatives documented in the history of present illness/assessment and plan.  Exam:  Vitals:   07/13/16 1138  BP: 120/82  Weight: 161 lb (73 kg)  Height: 5\' 2"  (1.575 m)   General appearance:  Normal  Gyn exam:  Three small Sebaceous gland cysts on left vulva and towards the upper left leg.  Otherwise normal vulva.  Assessment/Plan:  56 y.o. G1P0011   1. Infected sebaceous gland Will start regular warm soaks in bath or with Sitz baths.  Neosporin cream PRN.  Reassured, info given.  F/u Annual/Gyn exam.  Counseling >50% x 15 minutes on above issue.  Princess Bruins MD, 11:56 AM 07/13/2016

## 2016-07-13 NOTE — Patient Instructions (Signed)
Infected sebaceous gland Will start regular warm soaks in bath or with Sitz baths.  Neosporin cream PRN.  Reassured, info given.  F/u Annual/Gyn exam.  Langley Gauss, it was a pleasure to see you today!

## 2016-07-14 ENCOUNTER — Encounter: Payer: Self-pay | Admitting: *Deleted

## 2016-07-14 ENCOUNTER — Telehealth: Payer: Self-pay | Admitting: *Deleted

## 2016-07-14 DIAGNOSIS — R079 Chest pain, unspecified: Secondary | ICD-10-CM | POA: Diagnosis not present

## 2016-07-14 DIAGNOSIS — M546 Pain in thoracic spine: Secondary | ICD-10-CM | POA: Diagnosis not present

## 2016-07-14 DIAGNOSIS — M545 Low back pain: Secondary | ICD-10-CM | POA: Diagnosis not present

## 2016-07-14 DIAGNOSIS — M47814 Spondylosis without myelopathy or radiculopathy, thoracic region: Secondary | ICD-10-CM | POA: Diagnosis not present

## 2016-07-14 DIAGNOSIS — M5136 Other intervertebral disc degeneration, lumbar region: Secondary | ICD-10-CM | POA: Diagnosis not present

## 2016-07-14 DIAGNOSIS — R0789 Other chest pain: Secondary | ICD-10-CM | POA: Diagnosis not present

## 2016-07-14 DIAGNOSIS — R0989 Other specified symptoms and signs involving the circulatory and respiratory systems: Secondary | ICD-10-CM | POA: Diagnosis not present

## 2016-07-14 DIAGNOSIS — G8929 Other chronic pain: Secondary | ICD-10-CM | POA: Diagnosis not present

## 2016-07-14 NOTE — Telephone Encounter (Signed)
Agree with note to work today.  Please tell patient to let us know before the fact in the future.

## 2016-07-14 NOTE — Telephone Encounter (Signed)
Pt was seen yesterday for OV needs a new note to return to work today, pt decided not to go back to work yesterday after OV. Okay to write return to work today? Please advise

## 2016-07-14 NOTE — Telephone Encounter (Signed)
Note provided,pt will come pick up at front desk.

## 2016-08-30 DIAGNOSIS — E119 Type 2 diabetes mellitus without complications: Secondary | ICD-10-CM | POA: Diagnosis not present

## 2016-08-30 DIAGNOSIS — H10413 Chronic giant papillary conjunctivitis, bilateral: Secondary | ICD-10-CM | POA: Diagnosis not present

## 2016-09-05 ENCOUNTER — Ambulatory Visit
Admission: RE | Admit: 2016-09-05 | Discharge: 2016-09-05 | Disposition: A | Discharge status: Home / Self Care | Payer: Federal, State, Local not specified - PPO | Source: Ambulatory Visit | Attending: Internal Medicine | Admitting: Internal Medicine

## 2016-09-05 DIAGNOSIS — R945 Abnormal results of liver function studies: Secondary | ICD-10-CM

## 2016-09-05 DIAGNOSIS — R7989 Other specified abnormal findings of blood chemistry: Secondary | ICD-10-CM

## 2016-09-12 DIAGNOSIS — E119 Type 2 diabetes mellitus without complications: Secondary | ICD-10-CM | POA: Diagnosis not present

## 2016-09-12 DIAGNOSIS — I10 Essential (primary) hypertension: Secondary | ICD-10-CM | POA: Diagnosis not present

## 2016-09-13 ENCOUNTER — Other Ambulatory Visit: Payer: Self-pay

## 2016-09-13 DIAGNOSIS — M5126 Other intervertebral disc displacement, lumbar region: Secondary | ICD-10-CM | POA: Diagnosis not present

## 2016-09-13 DIAGNOSIS — M545 Low back pain: Secondary | ICD-10-CM | POA: Diagnosis not present

## 2016-09-13 DIAGNOSIS — M5431 Sciatica, right side: Secondary | ICD-10-CM | POA: Diagnosis not present

## 2016-09-13 MED ORDER — ESTRADIOL 10 MCG VA TABS: 1.0000 | ORAL_TABLET | VAGINAL | 1 refills | Status: DC

## 2016-09-15 DIAGNOSIS — E118 Type 2 diabetes mellitus with unspecified complications: Secondary | ICD-10-CM | POA: Diagnosis not present

## 2016-09-15 DIAGNOSIS — I10 Essential (primary) hypertension: Secondary | ICD-10-CM | POA: Diagnosis not present

## 2016-09-15 DIAGNOSIS — E7800 Pure hypercholesterolemia, unspecified: Secondary | ICD-10-CM | POA: Diagnosis not present

## 2016-09-20 DIAGNOSIS — Z01818 Encounter for other preprocedural examination: Secondary | ICD-10-CM | POA: Diagnosis not present

## 2016-09-27 DIAGNOSIS — K760 Fatty (change of) liver, not elsewhere classified: Secondary | ICD-10-CM | POA: Diagnosis not present

## 2016-09-27 DIAGNOSIS — E6609 Other obesity due to excess calories: Secondary | ICD-10-CM | POA: Diagnosis not present

## 2016-09-27 DIAGNOSIS — R748 Abnormal levels of other serum enzymes: Secondary | ICD-10-CM | POA: Diagnosis not present

## 2016-09-27 DIAGNOSIS — K7460 Unspecified cirrhosis of liver: Secondary | ICD-10-CM | POA: Diagnosis not present

## 2016-09-27 DIAGNOSIS — R7989 Other specified abnormal findings of blood chemistry: Secondary | ICD-10-CM | POA: Diagnosis not present

## 2016-10-06 DIAGNOSIS — E118 Type 2 diabetes mellitus with unspecified complications: Secondary | ICD-10-CM | POA: Diagnosis not present

## 2016-10-12 DIAGNOSIS — E119 Type 2 diabetes mellitus without complications: Secondary | ICD-10-CM | POA: Diagnosis not present

## 2016-10-12 DIAGNOSIS — L732 Hidradenitis suppurativa: Secondary | ICD-10-CM | POA: Diagnosis not present

## 2016-10-19 DIAGNOSIS — K7581 Nonalcoholic steatohepatitis (NASH): Secondary | ICD-10-CM | POA: Diagnosis not present

## 2016-10-19 DIAGNOSIS — K7460 Unspecified cirrhosis of liver: Secondary | ICD-10-CM | POA: Diagnosis not present

## 2016-10-25 ENCOUNTER — Emergency Department (HOSPITAL_COMMUNITY)
Admission: EM | Admit: 2016-10-25 | Discharge: 2016-10-25 | Discharge status: Against Medical Advice | Payer: Federal, State, Local not specified - PPO | Attending: Emergency Medicine | Admitting: Emergency Medicine

## 2016-10-25 ENCOUNTER — Ambulatory Visit: Payer: Federal, State, Local not specified - PPO | Admitting: Sports Medicine

## 2016-10-25 ENCOUNTER — Ambulatory Visit (HOSPITAL_COMMUNITY)
Admission: EM | Admit: 2016-10-25 | Discharge: 2016-10-25 | Disposition: A | Discharge status: Home / Self Care | Payer: Federal, State, Local not specified - PPO | Attending: Family Medicine | Admitting: Family Medicine

## 2016-10-25 ENCOUNTER — Encounter (HOSPITAL_COMMUNITY): Payer: Self-pay | Admitting: Emergency Medicine

## 2016-10-25 DIAGNOSIS — Z5321 Procedure and treatment not carried out due to patient leaving prior to being seen by health care provider: Secondary | ICD-10-CM | POA: Diagnosis not present

## 2016-10-25 DIAGNOSIS — A46 Erysipelas: Secondary | ICD-10-CM

## 2016-10-25 MED ORDER — CEPHALEXIN 500 MG PO CAPS: 500.0000 mg | ORAL_CAPSULE | Freq: Four times a day (QID) | ORAL | 0 refills | Status: DC

## 2016-10-25 NOTE — ED Triage Notes (Signed)
PT reports swelling/rash to both legs. She noticed them yesterday.

## 2016-10-25 NOTE — Discharge Instructions (Signed)
Schedule a follow up in about 7 days with Dr. Maudie Mercury.  Take the antibiotic 4 times daily and return for care sooner if you get worse or develop fever.

## 2016-10-25 NOTE — ED Triage Notes (Signed)
Patient states she felt whelps on the back of right leg yesterday around 11 am. Patient states when she got up this morning the whelps were all up her right leg and the swelling had increased.

## 2016-10-25 NOTE — ED Notes (Signed)
PATIENT LEFT

## 2016-10-25 NOTE — ED Provider Notes (Signed)
[place]    CSN: [id number] Arrival date & time: 10/25/16  1150     History   Chief Complaint Chief Complaint  Patient presents with  . Rash    HPI Vicki [name] is a 56 y.o. female with a history of NASH cirrhosis, well-controlled T2DM, and skin infections who presents for red rash on the right leg.   She notes moderate, constant pain related to red rash on the back of the right leg that has become larger in area over the past 24 hours. Nothing tried for symptoms, but general worsening caused her to seek medical attention. Of note, she recently completed a 10-day course of doxycycline, reportedly for hidradenitis. She had boils in the groin area which have resolved. No fevers, no change in detergents, soaps, lotions, etc.   HPI  Past Medical History:  Diagnosis Date  . Allergy   . Boil   . Diabetes ([place])    type 2  . Elevated liver enzymes   . Fatty liver   . Hyperlipemia   . Hypertension   . 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 PVC's (premature ventricular contractions)     Patient Active Problem List   Diagnosis Date Noted  . Palpitations 04/15/2015  . Controlled type 2 diabetes mellitus without complication ([place]) 32/99/2426  . Elevated liver enzymes 03/31/2014  . Tinea pedis 07/31/2013  . Foot and toe(s), blister, infected 07/31/2013  . 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 Pain in joint, ankle and foot 08/31/2012    Past Surgical History:  Procedure Laterality Date  . COLONOSCOPY  2014  . ECTOPIC PREGNANCY SURGERY  1990  . TONSILLECTOMY  1980    OB History    Gravida Para Term Preterm AB Living   1 0 0 0 1 1   SAB TAB Ectopic Multiple Live Births   0 0 1 0 0       Home Medications    Prior to Admission medications   Medication Sig Start Date End Date Taking? Authorizing Provider  acetaminophen (TYLENOL) 500 MG tablet Take 500 mg by mouth as needed for pain.    [provider]  BENICAR HCT 40-25 MG per  tablet Take 0.5 tablets by mouth daily. Half tab daily 09/02/12   [provider]  Calcium Carbonate-Simethicone (MAALOX MAX PO) Take by mouth as needed.    [provider]  cephALEXin (KEFLEX) 500 MG capsule Take 1 capsule (500 mg total) by mouth 4 (four) times daily. 10/25/16   Patrecia Pour, MD  cholecalciferol (VITAMIN D) 1000 UNITS tablet Take 2,000 Units by mouth daily.     [provider]  Estradiol 10 MCG TABS vaginal tablet Place 1 tablet (10 mcg total) vaginally 2 (two) times a week. 09/15/16   Princess Bruins, MD  fexofenadine (ALLEGRA) 180 MG tablet Take 180 mg by mouth daily.    [provider]  metFORMIN (GLUCOPHAGE) 500 MG tablet Take 500 mg by mouth daily with breakfast.     [provider]  NONFORMULARY OR COMPOUNDED [place]:  Combination Pain Cream - Baclofen 2%, Doxepin 5%, Gabapentin 6%, Topora,ate 2%, Pentoxifylline 3%, apply 1-2 grams to affected area 3-4 times daily. 10/20/15   Landis Martins, DPM  Omega-3 Fatty Acids (FISH OIL) 1000 MG CAPS Take by mouth.    [provider]  ONE TOUCH ULTRA TEST test strip  05/14/12   [provider]  sitaGLIPtin (JANUVIA) 100 MG tablet Take 100  mg by mouth daily.    [provider]  traMADol (ULTRAM) 50 MG tablet as needed. 06/13/16   [provider]  Turmeric 500 MG CAPS Take by mouth.    [provider]    Family History Family History  Problem Relation Age of Onset  . Hypertension Mother   . Diabetes Mother   . Heart attack Mother   . Hypertension Father   . Heart attack Sister 33  . Diabetes Sister   . Hypertension Sister   . Goiter Unknown   . Asthma Unknown   . Colon cancer Neg Hx     Social History Social History  Substance Use Topics  . Smoking status: Never Smoker  . Smokeless tobacco: Never Used  . Alcohol use 1.8 oz/week    3 Standard drinks or equivalent per week     Allergies   Sulfa antibiotics and  Sulfasalazine   Review of Systems Review of Systems As above  Physical Exam Triage Vital Signs ED Triage Vitals  Enc Vitals Group     BP 10/25/16 1250 125/62     Pulse Rate 10/25/16 1250 91     Resp 10/25/16 1250 16     Temp 10/25/16 1250 98.6 F (37 C)     Temp Source 10/25/16 1250 Oral     SpO2 10/25/16 1250 98 %     Weight 10/25/16 1250 160 lb (72.6 kg)     Height 10/25/16 1250 5\' 2"  (1.575 m)     Head Circumference --      Peak Flow --      Pain Score 10/25/16 1252 6     Pain Loc --      Pain Edu? --      Excl. in [place]? --    No data found.   Updated Vital Signs BP 125/62 (BP Location: Left Arm)   Pulse 91   Temp 98.6 F (37 C) (Oral)   Resp 16   Ht 5\' 2"  (1.575 m)   Wt 160 lb (72.6 kg)   LMP 04/30/2012   SpO2 98%   BMI 29.26 kg/m     Physical Exam  Constitutional: She appears well-developed and well-nourished. No distress.  HENT:  Head: Normocephalic and atraumatic.  Eyes: Conjunctivae are normal.  Neck: Neck supple.  Cardiovascular: Normal rate and regular rhythm.   No murmur heard. Pulmonary/Chest: Effort normal and breath sounds normal. No respiratory distress.  Abdominal: Soft. There is no tenderness.  Musculoskeletal: She exhibits no edema.  Neurological: She is alert.  Skin: Skin is warm and dry.  raised, warm, irregular erythematous induration on posterior right leg without fluctuance. 2 small blisters with clear fluid are present within induration.   Psychiatric: She has a normal mood and affect.  Nursing note and vitals reviewed. No lymphadenopathy.  UC Treatments / Results  Labs (all labs ordered are listed, but only abnormal results are displayed) Labs Reviewed - No data to display  EKG  EKG Interpretation None       Radiology No results found.  Procedures Procedures (including critical care time)  Medications Ordered in UC Medications - No data to display   Initial Impression / Assessment and Plan / UC Course  I have  reviewed the triage vital signs and the nursing notes.  Pertinent labs & imaging results that were available during my care of the patient were reviewed by me and considered in my medical decision making (see chart for details).   Final Clinical Impressions(s) /  UC Diagnoses   Final diagnoses:  Erysipelas of lower extremity   56 y.o. female with diabetes and cirrhosis presenting for cellulitis/erysipelas of the right lower extremity without evidence of abscess. - Keflex x10 days. Sulfa allergy noted. Last Cr wnl.  - Advised to go ahead and schedule follow up with PCP in the next 7 days for recheck or follow up sooner is systemic symptoms or local worsening occur.  New Prescriptions New Prescriptions   CEPHALEXIN (KEFLEX) 500 MG CAPSULE    Take 1 capsule (500 mg total) by mouth 4 (four) times daily.     Patrecia Pour, MD 10/25/16 [phone]

## 2016-10-31 DIAGNOSIS — A46 Erysipelas: Secondary | ICD-10-CM | POA: Diagnosis not present

## 2016-10-31 DIAGNOSIS — W57XXXS Bitten or stung by nonvenomous insect and other nonvenomous arthropods, sequela: Secondary | ICD-10-CM | POA: Diagnosis not present

## 2016-10-31 DIAGNOSIS — L989 Disorder of the skin and subcutaneous tissue, unspecified: Secondary | ICD-10-CM | POA: Diagnosis not present

## 2016-11-22 ENCOUNTER — Ambulatory Visit (HOSPITAL_COMMUNITY)
Admission: EM | Admit: 2016-11-22 | Discharge: 2016-11-22 | Disposition: A | Discharge status: Home / Self Care | Payer: Federal, State, Local not specified - PPO | Attending: Family Medicine | Admitting: Family Medicine

## 2016-11-22 ENCOUNTER — Encounter (HOSPITAL_COMMUNITY): Payer: Self-pay | Admitting: Family Medicine

## 2016-11-22 DIAGNOSIS — L03115 Cellulitis of right lower limb: Secondary | ICD-10-CM | POA: Diagnosis not present

## 2016-11-22 DIAGNOSIS — L03113 Cellulitis of right upper limb: Secondary | ICD-10-CM | POA: Diagnosis not present

## 2016-11-22 MED ORDER — CLINDAMYCIN HCL 150 MG PO CAPS: 150.0000 mg | ORAL_CAPSULE | Freq: Three times a day (TID) | ORAL | 0 refills | Status: DC

## 2016-11-22 NOTE — ED Triage Notes (Signed)
Patient has a rash to inside of right arm, back of right leg.  Patient says she has had similar rashes .  Rash is red, itches, burns and has pimple-like pumps with white tops.

## 2016-11-22 NOTE — ED Provider Notes (Signed)
[place]   [id number] 11/22/16 Arrival Time: 1257   SUBJECTIVE:  Vicki [name] is a 56 y.o. female who presents to the urgent care with complaint of rash.  It began several days ago and is a recurrent problem, treated last month with Keflex and 10 months  Ago with clindamycin.  Patient has underlying high iron and cirrhosis secondary to fatty liver.     Past Medical History:  Diagnosis Date  . Allergy   . Boil   . Diabetes ([place])    type 2  . Elevated liver enzymes   . Fatty liver   . Hyperlipemia   . Hypertension   . 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 PVC's (premature ventricular contractions)    Family History  Problem Relation Age of Onset  . Hypertension Mother   . Diabetes Mother   . Heart attack Mother   . Hypertension Father   . Heart attack Sister 57  . Diabetes Sister   . Hypertension Sister   . Goiter Unknown   . Asthma Unknown   . Colon cancer Neg Hx    Social History   Social History  . Marital status: Single    Spouse name: N/A  . Number of children: N/A  . Years of education: N/A   Occupational History  . Not on file.   Social History Main Topics  . Smoking status: Never Smoker  . Smokeless tobacco: Never Used  . Alcohol use 1.8 oz/week    3 Standard drinks or equivalent per week  . Drug use: No  . Sexual activity: Yes    Birth control/ protection: Condom   Other Topics Concern  . Not on file   Social History Narrative   Epworth Sleepiness Scale = 3 (as of 04/15/2015)   No outpatient prescriptions have been marked as taking for the 11/22/16 encounter [facility] Encounter).   Allergies  Allergen Reactions  . Sulfa Antibiotics Itching  . Sulfasalazine Itching      ROS: As per HPI, remainder of ROS negative.   OBJECTIVE:   Vitals:   11/22/16 1321  BP: 123/85  Pulse: 85  Resp: 18  Temp: 98.3 F (36.8 C)  TempSrc: Oral  SpO2: 99%     General appearance: alert; no distress Eyes: PERRL;  EOMI; conjunctiva normal HENT: normocephalic; atraumatic; TMs normal, canal normal, external ears normal without trauma; nasal mucosa normal; oral mucosa normal Neck: supple Lungs: clear to auscultation bilaterally Heart: regular rate and rhythm Abdomen: soft, non-tender; bowel sounds normal; no masses or organomegaly; no guarding or rebound tenderness Back: no CVA tenderness Extremities: no cyanosis or edema; symmetrical with no gross deformities Skin: warm and dry with hyperpigmented area on posterior right thigh and inner surface of biceps on the right with few small pustules. Each of these areas are well-circumscribed Neurologic: normal gait; grossly normal Psychological: alert and cooperative; normal mood and affect      Labs:  Results for orders placed or performed during the hospital encounter of 03/08/16  I-STAT creatinine  Result Value Ref Range   Creatinine, Ser 0.70 0.44 - 1.00 mg/dL    Labs Reviewed - No data to display  No results found.     ASSESSMENT & PLAN:  1. Cellulitis of leg, right   2. Cellulitis of right upper extremity     Meds ordered this encounter  Medications  . clindamycin (CLEOCIN) 150 MG capsule    Sig: Take 1 capsule (150 mg total) by  mouth 3 (three) times daily.    Dispense:  30 capsule    Refill:  0    Reviewed expectations re: course of current medical issues. Questions answered. Outlined signs and symptoms indicating need for more acute intervention. Patient verbalized understanding. After Visit Summary given.      Robyn Haber, MD 11/22/16 1331

## 2016-11-22 NOTE — Discharge Instructions (Signed)
You need to get follow-up on the liver disease because I believe your immune status is affected and this rash will continue to return until liver situation is under better control

## 2016-11-23 DIAGNOSIS — H10413 Chronic giant papillary conjunctivitis, bilateral: Secondary | ICD-10-CM | POA: Diagnosis not present

## 2016-11-23 DIAGNOSIS — H40013 Open angle with borderline findings, low risk, bilateral: Secondary | ICD-10-CM | POA: Diagnosis not present

## 2016-11-23 DIAGNOSIS — E119 Type 2 diabetes mellitus without complications: Secondary | ICD-10-CM | POA: Diagnosis not present

## 2016-11-24 DIAGNOSIS — L0390 Cellulitis, unspecified: Secondary | ICD-10-CM | POA: Diagnosis not present

## 2016-11-28 DIAGNOSIS — L258 Unspecified contact dermatitis due to other agents: Secondary | ICD-10-CM | POA: Diagnosis not present

## 2016-12-05 DIAGNOSIS — I10 Essential (primary) hypertension: Secondary | ICD-10-CM | POA: Diagnosis not present

## 2016-12-05 DIAGNOSIS — R7989 Other specified abnormal findings of blood chemistry: Secondary | ICD-10-CM | POA: Diagnosis not present

## 2016-12-05 DIAGNOSIS — E118 Type 2 diabetes mellitus with unspecified complications: Secondary | ICD-10-CM | POA: Diagnosis not present

## 2016-12-14 DIAGNOSIS — I10 Essential (primary) hypertension: Secondary | ICD-10-CM | POA: Diagnosis not present

## 2016-12-14 DIAGNOSIS — E119 Type 2 diabetes mellitus without complications: Secondary | ICD-10-CM | POA: Diagnosis not present

## 2016-12-14 DIAGNOSIS — K7689 Other specified diseases of liver: Secondary | ICD-10-CM | POA: Diagnosis not present

## 2016-12-21 DIAGNOSIS — S80861D Insect bite (nonvenomous), right lower leg, subsequent encounter: Secondary | ICD-10-CM | POA: Diagnosis not present

## 2016-12-30 ENCOUNTER — Ambulatory Visit (INDEPENDENT_AMBULATORY_CARE_PROVIDER_SITE_OTHER): Payer: Federal, State, Local not specified - PPO | Admitting: Cardiovascular Disease

## 2016-12-30 ENCOUNTER — Encounter: Payer: Self-pay | Admitting: Cardiovascular Disease

## 2016-12-30 VITALS — BP 130/79 | HR 81 | Ht 62.0 in | Wt 164.0 lb

## 2016-12-30 DIAGNOSIS — R002 Palpitations: Secondary | ICD-10-CM

## 2016-12-30 NOTE — Assessment & Plan Note (Signed)
History of palpitations with normal 2-D echocardiogram and event monitor showing frequent PVCs. At that time she was admitted to alcohol and caffeine intake which when modified improved her symptoms. Now she says she has palpitations which are somewhat different than her prior symptoms. I am going to get to event monitor to further evaluate.

## 2016-12-30 NOTE — Patient Instructions (Signed)
Medication Instructions: Your physician recommends that you continue on your current medications as directed. Please refer to the Current Medication list given to you today.   Testing/Procedures: Your physician has recommended that you wear a 14 day event monitor. Event monitors are medical devices that record the heart's electrical activity. Doctors most often [place] these monitors to diagnose arrhythmias. Arrhythmias are problems with the speed or rhythm of the heartbeat. The monitor is a small, portable device. You can wear one while you do your normal daily activities. This is usually used to diagnose what is causing palpitations/syncope (passing out).   Follow-Up: Your physician wants you to follow-up in: 1 year with Dr. Gwenlyn Found. You will receive a reminder letter in the mail two months in advance. If you don't receive a letter, please call our office to schedule the follow-up appointment.  If you need a refill on your cardiac medications before your next appointment, please call your pharmacy.

## 2016-12-30 NOTE — Progress Notes (Signed)
12/30/2016 Vicki [name]   1960-07-13  [id number]  Primary Physician Jani Gravel, MD Primary Cardiologist: Lorretta Harp MD Lupe Carney, [place]  HPI:  Vicki [name] is a 56 y.o. 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 Maudie Mercury for cardiac evaluation because of new onset palpitations. I last saw her in the office 09/25/15. Factors include treated hypertension and diabetes. Her mother did have a myocardial infarction at age 43. She has never had a heart attack or stroke and denies chest pain or shortness of breath. She is in her perimenopause time window is followed by Dr. Dellis Filbert. She drinks 2 cups of caffeinated beverages a day as well as alcohol on the weekends. She is not under a lot of stress recently. A 2-D echocardiogram was normal and an event monitor showed sinus rhythm sinus/sinus tachycardia and frequent PVCs. She had decreased her caffeine intake which improved her symptoms a year ago. However recently, she's noticed increased heart rate and palpitations are somewhat different quality than her previous symptoms. She also was recently diagnosed with NASH.   Current Meds  Medication Sig  . acetaminophen (TYLENOL) 500 MG tablet Take 500 mg by mouth as needed for pain.  [name] BENICAR HCT 40-25 MG per tablet Take 0.5 tablets by mouth daily. Half tab daily  . Calcium Carbonate-Simethicone (MAALOX MAX PO) Take by mouth as needed.  . Dulaglutide (TRULICITY) 1.70 YF/7.4BS SOPN Inject into the skin.  . fexofenadine (ALLEGRA) 180 MG tablet Take 180 mg by mouth daily.  . NONFORMULARY OR COMPOUNDED ITEM [facility]:  Combination Pain Cream - Baclofen 2%, Doxepin 5%, Gabapentin 6%, Topora,ate 2%, Pentoxifylline 3%, apply 1-2 grams to affected area 3-4 times daily.  . ONE TOUCH ULTRA TEST test strip   . traMADol (ULTRAM) 50 MG tablet as needed.     Allergies  Allergen Reactions  . Sulfa Antibiotics  Itching  . Sulfasalazine Itching    Social History   Social History  . Marital status: Single    Spouse name: N/A  . Number of children: N/A  . Years of education: N/A   Occupational History  . Not on file.   Social History Main Topics  . Smoking status: Never Smoker  . Smokeless tobacco: Never Used  . Alcohol use 1.8 oz/week    3 Standard drinks or equivalent per week  . Drug use: No  . Sexual activity: Yes    Birth control/ protection: Condom   Other Topics Concern  . Not on file   Social History Narrative   Epworth Sleepiness Scale = 3 (as of 04/15/2015)     Review of Systems: General: negative for chills, fever, night sweats or weight changes.  Cardiovascular: negative for chest pain, dyspnea on exertion, edema, orthopnea, palpitations, paroxysmal nocturnal dyspnea or shortness of breath Dermatological: negative for rash Respiratory: negative for cough or wheezing Urologic: negative for hematuria Abdominal: negative for nausea, vomiting, diarrhea, bright red blood per rectum, melena, or hematemesis Neurologic: negative for visual changes, syncope, or dizziness All other systems reviewed and are otherwise negative except as noted above.    Blood pressure 130/79, pulse 81, height 5' 2"  (1.575 m), weight 164 lb (74.4 kg), last menstrual period 04/30/2012.  General appearance: alert and no distress Neck: no adenopathy, no carotid bruit, no JVD, supple, symmetrical, trachea midline and thyroid not enlarged, symmetric, no tenderness/mass/nodules Lungs: clear to auscultation bilaterally Heart: regular rate and  rhythm, S1, S2 normal, no murmur, click, rub or gallop Extremities: extremities normal, atraumatic, no cyanosis or edema Pulses: 2+ and symmetric Skin: Skin color, texture, turgor normal. No rashes or lesions Neurologic: Alert and oriented X 3, normal strength and tone. Normal symmetric reflexes. Normal coordination and gait  EKG sinus rhythm at 81 with  nonspecific ST and T-wave changes.I  Personally reviewed this EKG  ASSESSMENT AND PLAN:   Palpitations History of palpitations with normal 2-D echocardiogram and event monitor showing frequent PVCs. At that time she was admitted to alcohol and caffeine intake which when modified improved her symptoms. Now she says she has palpitations which are somewhat different than her prior symptoms. I am going to get to event monitor to further evaluate.      Lorretta Harp MD FACP,FACC,FAHA, [facility] 12/30/2016 10:17 AM

## 2017-01-13 ENCOUNTER — Encounter: Payer: Self-pay | Admitting: Obstetrics & Gynecology

## 2017-01-13 ENCOUNTER — Ambulatory Visit: Payer: Federal, State, Local not specified - PPO | Admitting: Obstetrics & Gynecology

## 2017-01-13 VITALS — BP 128/72

## 2017-01-13 DIAGNOSIS — R300 Dysuria: Secondary | ICD-10-CM

## 2017-01-13 DIAGNOSIS — N766 Ulceration of vulva: Secondary | ICD-10-CM

## 2017-01-13 NOTE — Progress Notes (Signed)
    Vicki [name] 06-18-1960 [id number]        56 y.o.  G1P0011   RP:  Left vulvar irritation   HPI:  Left vulvar irritation and tenderness x a couple of days with small bumps felt.  No h/o genital Herpes per patient, but history was as follows when seen by me in 07/2016:  C/O bumps coming and going on vulva and towards Lt inguinal area x the last year.  Mildly tender.  Occasional drainage. Menopause.  No HRT, but using Vagifem 2x a week for Atrophic Vaginitis.  Improved Sxs on it.  No PMB.  No pelvic pain.  No vaginal d/c.  At that visit, Sebaceous gland Cysts were diagnosed.  Past medical history,surgical history, problem list, medications, allergies, family history and social history were all reviewed and documented in the EPIC chart.  Directed ROS with pertinent positives and negatives documented in the history of present illness/assessment and plan.  Exam:  Vitals:   01/13/17 1055  BP: 128/72   General appearance:  Normal  Gyn exam:  Vulva:  Left upper vulva with ulcerations, very tender.  HSV Sureswab done.  U/A negative  Assessment/Plan:  56 y.o. G1P0011   1. Vulvar ulceration Irritation vs recurrent genital Herpes.  HSV sureswab done.  Will apply Neosporin ointment until HSV results obtained.  Prefers to wait on results before treating for HSV.  2. Dysuria U/A completely negative. - Urinalysis with Culture Reflex  Counseling on above issues >50% x 15 minutes  Princess Bruins MD, 11:00 AM 01/13/2017

## 2017-01-14 LAB — URINALYSIS W MICROSCOPIC + REFLEX CULTURE
Bacteria, UA: NONE SEEN /HPF
Bilirubin Urine: NEGATIVE
Glucose, UA: NEGATIVE
Hgb urine dipstick: NEGATIVE
Hyaline Cast: NONE SEEN /LPF
Leukocyte Esterase: NEGATIVE
Nitrites, Initial: NEGATIVE
Protein, ur: NEGATIVE
RBC / HPF: NONE SEEN /HPF (ref 0–2)
Specific Gravity, Urine: 1.024 (ref 1.001–1.03)
WBC, UA: NONE SEEN /HPF (ref 0–5)
pH: 5.5 (ref 5.0–8.0)

## 2017-01-14 LAB — NO CULTURE INDICATED

## 2017-01-16 ENCOUNTER — Encounter: Payer: Self-pay | Admitting: Obstetrics & Gynecology

## 2017-01-16 NOTE — Patient Instructions (Signed)
1. Vulvar ulceration Irritation vs recurrent genital Herpes.  HSV sureswab done.  Will apply Neosporin ointment until HSV results obtained.  Prefers to wait on results before treating for HSV.  2. Dysuria U/A completely negative. - Urinalysis with Culture Reflex  Langley Gauss, good seeing you today!  I will inform you of your results as soon as available.

## 2017-01-17 LAB — SURESWAB HSV, TYPE 1/2 DNA, PCR
HSV 1 DNA: NOT DETECTED
HSV 2 DNA: NOT DETECTED

## 2017-01-30 ENCOUNTER — Ambulatory Visit (INDEPENDENT_AMBULATORY_CARE_PROVIDER_SITE_OTHER): Payer: Federal, State, Local not specified - PPO

## 2017-01-30 DIAGNOSIS — R002 Palpitations: Secondary | ICD-10-CM | POA: Diagnosis not present

## 2017-02-15 ENCOUNTER — Telehealth: Payer: Self-pay

## 2017-02-15 NOTE — Telephone Encounter (Signed)
Patient was in on 01/13/17 to see you for vulvar ulceration. Her HSV 1 and 2 cultures were negative. She has been using Neosporin on the area as you recommended but she says it still has not healed completely and she still has irritation. What to rec?

## 2017-02-16 NOTE — Telephone Encounter (Signed)
Left message to call me.

## 2017-02-16 NOTE — Telephone Encounter (Signed)
Needs to come in for a vulvar biopsy at next appointment available.

## 2017-02-17 NOTE — Telephone Encounter (Signed)
Patient informed. Appt scheduled.

## 2017-03-16 DIAGNOSIS — E119 Type 2 diabetes mellitus without complications: Secondary | ICD-10-CM | POA: Diagnosis not present

## 2017-03-16 DIAGNOSIS — R7989 Other specified abnormal findings of blood chemistry: Secondary | ICD-10-CM | POA: Diagnosis not present

## 2017-03-16 DIAGNOSIS — Z79899 Other long term (current) drug therapy: Secondary | ICD-10-CM | POA: Diagnosis not present

## 2017-03-16 DIAGNOSIS — I10 Essential (primary) hypertension: Secondary | ICD-10-CM | POA: Diagnosis not present

## 2017-03-23 DIAGNOSIS — K7689 Other specified diseases of liver: Secondary | ICD-10-CM | POA: Diagnosis not present

## 2017-03-23 DIAGNOSIS — E7800 Pure hypercholesterolemia, unspecified: Secondary | ICD-10-CM | POA: Diagnosis not present

## 2017-03-23 DIAGNOSIS — I10 Essential (primary) hypertension: Secondary | ICD-10-CM | POA: Diagnosis not present

## 2017-03-23 DIAGNOSIS — E119 Type 2 diabetes mellitus without complications: Secondary | ICD-10-CM | POA: Diagnosis not present

## 2017-03-27 ENCOUNTER — Ambulatory Visit: Payer: Federal, State, Local not specified - PPO | Admitting: Obstetrics & Gynecology

## 2017-04-18 DIAGNOSIS — K7581 Nonalcoholic steatohepatitis (NASH): Secondary | ICD-10-CM | POA: Diagnosis not present

## 2017-04-18 DIAGNOSIS — K7469 Other cirrhosis of liver: Secondary | ICD-10-CM | POA: Diagnosis not present

## 2017-04-18 DIAGNOSIS — R591 Generalized enlarged lymph nodes: Secondary | ICD-10-CM | POA: Diagnosis not present

## 2017-04-18 DIAGNOSIS — K7460 Unspecified cirrhosis of liver: Secondary | ICD-10-CM | POA: Diagnosis not present

## 2017-04-24 ENCOUNTER — Other Ambulatory Visit: Payer: Self-pay | Admitting: Obstetrics & Gynecology

## 2017-04-24 DIAGNOSIS — Z1231 Encounter for screening mammogram for malignant neoplasm of breast: Secondary | ICD-10-CM

## 2017-05-08 DIAGNOSIS — H40013 Open angle with borderline findings, low risk, bilateral: Secondary | ICD-10-CM | POA: Diagnosis not present

## 2017-05-08 DIAGNOSIS — H10413 Chronic giant papillary conjunctivitis, bilateral: Secondary | ICD-10-CM | POA: Diagnosis not present

## 2017-05-08 DIAGNOSIS — E119 Type 2 diabetes mellitus without complications: Secondary | ICD-10-CM | POA: Diagnosis not present

## 2017-05-12 ENCOUNTER — Ambulatory Visit
Admission: RE | Admit: 2017-05-12 | Discharge: 2017-05-12 | Disposition: A | Discharge status: Home / Self Care | Payer: Federal, State, Local not specified - PPO | Source: Ambulatory Visit | Attending: Obstetrics & Gynecology | Admitting: Obstetrics & Gynecology

## 2017-05-12 DIAGNOSIS — Z1231 Encounter for screening mammogram for malignant neoplasm of breast: Secondary | ICD-10-CM

## 2017-06-05 DIAGNOSIS — H1010 Acute atopic conjunctivitis, unspecified eye: Secondary | ICD-10-CM | POA: Insufficient documentation

## 2017-06-05 DIAGNOSIS — H1013 Acute atopic conjunctivitis, bilateral: Secondary | ICD-10-CM | POA: Diagnosis not present

## 2017-06-12 DIAGNOSIS — E119 Type 2 diabetes mellitus without complications: Secondary | ICD-10-CM | POA: Diagnosis not present

## 2017-06-12 DIAGNOSIS — I10 Essential (primary) hypertension: Secondary | ICD-10-CM | POA: Diagnosis not present

## 2017-06-19 DIAGNOSIS — R945 Abnormal results of liver function studies: Secondary | ICD-10-CM | POA: Diagnosis not present

## 2017-06-19 DIAGNOSIS — I10 Essential (primary) hypertension: Secondary | ICD-10-CM | POA: Diagnosis not present

## 2017-06-19 DIAGNOSIS — E119 Type 2 diabetes mellitus without complications: Secondary | ICD-10-CM | POA: Diagnosis not present

## 2017-06-19 DIAGNOSIS — Z0000 Encounter for general adult medical examination without abnormal findings: Secondary | ICD-10-CM | POA: Diagnosis not present

## 2017-06-22 DIAGNOSIS — M150 Primary generalized (osteo)arthritis: Secondary | ICD-10-CM | POA: Diagnosis not present

## 2017-06-22 DIAGNOSIS — R899 Unspecified abnormal finding in specimens from other organs, systems and tissues: Secondary | ICD-10-CM | POA: Diagnosis not present

## 2017-06-22 DIAGNOSIS — K760 Fatty (change of) liver, not elsewhere classified: Secondary | ICD-10-CM | POA: Diagnosis not present

## 2017-07-14 DIAGNOSIS — K7469 Other cirrhosis of liver: Secondary | ICD-10-CM | POA: Diagnosis not present

## 2017-07-14 DIAGNOSIS — K7460 Unspecified cirrhosis of liver: Secondary | ICD-10-CM | POA: Diagnosis not present

## 2017-08-05 HISTORY — PX: UPPER GI ENDOSCOPY: SHX6162

## 2017-08-07 DIAGNOSIS — K7469 Other cirrhosis of liver: Secondary | ICD-10-CM | POA: Diagnosis not present

## 2017-08-07 DIAGNOSIS — K3189 Other diseases of stomach and duodenum: Secondary | ICD-10-CM | POA: Diagnosis not present

## 2017-08-07 DIAGNOSIS — Z79899 Other long term (current) drug therapy: Secondary | ICD-10-CM | POA: Diagnosis not present

## 2017-08-07 DIAGNOSIS — K7460 Unspecified cirrhosis of liver: Secondary | ICD-10-CM | POA: Diagnosis not present

## 2017-08-07 DIAGNOSIS — E119 Type 2 diabetes mellitus without complications: Secondary | ICD-10-CM | POA: Diagnosis not present

## 2017-08-07 DIAGNOSIS — I10 Essential (primary) hypertension: Secondary | ICD-10-CM | POA: Diagnosis not present

## 2017-08-07 DIAGNOSIS — K7581 Nonalcoholic steatohepatitis (NASH): Secondary | ICD-10-CM | POA: Diagnosis not present

## 2017-08-07 DIAGNOSIS — K219 Gastro-esophageal reflux disease without esophagitis: Secondary | ICD-10-CM | POA: Diagnosis not present

## 2017-08-29 ENCOUNTER — Encounter: Payer: Self-pay | Admitting: Sports Medicine

## 2017-08-29 ENCOUNTER — Ambulatory Visit: Payer: Federal, State, Local not specified - PPO | Admitting: Sports Medicine

## 2017-08-29 ENCOUNTER — Telehealth: Payer: Self-pay | Admitting: *Deleted

## 2017-08-29 DIAGNOSIS — M722 Plantar fascial fibromatosis: Secondary | ICD-10-CM

## 2017-08-29 DIAGNOSIS — M79671 Pain in right foot: Secondary | ICD-10-CM

## 2017-08-29 DIAGNOSIS — M79672 Pain in left foot: Secondary | ICD-10-CM

## 2017-08-29 DIAGNOSIS — B351 Tinea unguium: Secondary | ICD-10-CM

## 2017-08-29 MED ORDER — NONFORMULARY OR COMPOUNDED ITEM: 2 refills | Status: DC

## 2017-08-29 NOTE — Telephone Encounter (Signed)
Faxed orders to [facility].

## 2017-08-29 NOTE — Progress Notes (Signed)
Subjective: Vicki [name] is a 57 y.o.Diabetic female patient returns to office follow up heel pain on the right>left and palpable lump in arch on right. Patient states she is also concerned about her left 1 and 5th toes have fungus in them. Patient has not tried any treatment. Denies any other pedal complaints.   Patient Active Problem List   Diagnosis Date Noted  . Palpitations 04/15/2015  . Controlled type 2 diabetes mellitus without complication ([place]) 02/58/5277  . Elevated liver enzymes 03/31/2014  . Tinea pedis 07/31/2013  . Foot and toe(s), blister, infected 07/31/2013  . 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 Pain in joint, ankle and foot 08/31/2012    Current Outpatient Medications on File Prior to Visit  Medication Sig Dispense Refill  . acetaminophen (TYLENOL) 500 MG tablet Take 500 mg by mouth as needed for pain.    [name] BENICAR HCT 40-25 MG per tablet Take 0.5 tablets by mouth daily. Half tab daily    . Dulaglutide (TRULICITY) 8.24 MP/5.3IR SOPN Inject into the skin.    . fexofenadine (ALLEGRA) 180 MG tablet Take 180 mg by mouth daily.    . ONE TOUCH ULTRA TEST test strip     . traMADol (ULTRAM) 50 MG tablet as needed.     No current facility-administered medications on file prior to visit.     Allergies  Allergen Reactions  . Sulfa Antibiotics Itching  . Sulfasalazine Itching    Objective: Physical Exam General: The patient is alert and oriented x3 in no acute distress.  Dermatology: Skin is warm, dry and supple bilateral lower extremities. Nails 1-10 are normal however on left 1st and 5th toenails are discolored and thick. Focal to mid-arch there is a soft tissue mass in plantar fascia resembling fibroma on right. There is no erythema, edema, no eccymosis, no open lesions present. Integument is otherwise unremarkable.  Vascular: Dorsalis Pedis pulse and Posterior Tibial pulse are 2/4 bilateral. Capillary fill time is immediate to all digits.  Neurological:  Grossly intact to light touch with an achilles reflex of +2/5 and a negative Tinel's sign bilateral.  Musculoskeletal: Mild tenderness to palpation at the medial calcaneal tubercale and through the insertion of the plantar fascia on the right>Left foot especially at fibroma at right arch. No pain with compression of calcaneus bilateral. No pain with tuning fork to calcaneus bilateral. No pain with calf compression bilateral. There is decreased Ankle joint range of motion bilateral. All other joints range of motion within normal limits bilateral. Strength 5/5 in all groups bilateral.   Assessment and Plan: Problem List Items Addressed This Visit      Musculoskeletal and Integument   Plantar fascial fibromatosis - Primary    Other Visit Diagnoses    Right foot pain       Left foot pain       Nail fungus       L 1& 5 toes   Relevant Orders   Culture, fungus without smear     -Complete examination performed.  -Re-discussed with patient in detail the condition of plantar fasciitis with fibroma, how this occurs and general treatment options. Explained both conservative and surgical treatments.  -Patient continues to decline injection due to PVC history -Patient continues to decline oral medication due to PVC history -Rx topical pain cream to use at right foot/arch  -Recommended good supportive shoes and insoles -Continue with fascial braces of which she already has from before -Recommend cont to ice affected area 1-2x daily. -Biopsy of  left 1st and 5th toenails obtained from distal nail plate using sterile nail nipper without incident and sent to [facility] for fungal culture. Patient tolerated the procedure well without need for anesthesia and no complications.  -Patient to return to office in 4 weeks to discuss fungal culture results or sooner if problems or questions arise.  Landis Martins, DPM

## 2017-08-29 NOTE — Telephone Encounter (Signed)
-----   Message from Landis Martins, [place] sent at 08/29/2017  3:33 PM EDT ----- Regarding: [facility] Topical pain cream add ketamine

## 2017-09-19 ENCOUNTER — Ambulatory Visit: Payer: Federal, State, Local not specified - PPO | Admitting: Sports Medicine

## 2017-09-25 ENCOUNTER — Ambulatory Visit: Payer: Federal, State, Local not specified - PPO | Admitting: Sports Medicine

## 2017-10-06 DIAGNOSIS — I10 Essential (primary) hypertension: Secondary | ICD-10-CM | POA: Diagnosis not present

## 2017-10-06 DIAGNOSIS — E119 Type 2 diabetes mellitus without complications: Secondary | ICD-10-CM | POA: Diagnosis not present

## 2017-10-09 DIAGNOSIS — E119 Type 2 diabetes mellitus without complications: Secondary | ICD-10-CM | POA: Diagnosis not present

## 2017-10-09 DIAGNOSIS — I10 Essential (primary) hypertension: Secondary | ICD-10-CM | POA: Diagnosis not present

## 2017-10-09 DIAGNOSIS — E7800 Pure hypercholesterolemia, unspecified: Secondary | ICD-10-CM | POA: Diagnosis not present

## 2017-10-09 DIAGNOSIS — D509 Iron deficiency anemia, unspecified: Secondary | ICD-10-CM | POA: Diagnosis not present

## 2017-10-09 DIAGNOSIS — Z79899 Other long term (current) drug therapy: Secondary | ICD-10-CM | POA: Diagnosis not present

## 2017-10-12 DIAGNOSIS — K080 Exfoliation of teeth due to systemic causes: Secondary | ICD-10-CM | POA: Diagnosis not present

## 2017-10-13 DIAGNOSIS — D72819 Decreased white blood cell count, unspecified: Secondary | ICD-10-CM | POA: Diagnosis not present

## 2017-10-13 DIAGNOSIS — K7460 Unspecified cirrhosis of liver: Secondary | ICD-10-CM | POA: Diagnosis not present

## 2017-10-13 DIAGNOSIS — D696 Thrombocytopenia, unspecified: Secondary | ICD-10-CM | POA: Diagnosis not present

## 2017-10-18 DIAGNOSIS — K7581 Nonalcoholic steatohepatitis (NASH): Secondary | ICD-10-CM | POA: Diagnosis not present

## 2017-10-18 DIAGNOSIS — K7460 Unspecified cirrhosis of liver: Secondary | ICD-10-CM | POA: Diagnosis not present

## 2017-10-28 ENCOUNTER — Other Ambulatory Visit: Payer: Self-pay

## 2017-10-28 ENCOUNTER — Ambulatory Visit (INDEPENDENT_AMBULATORY_CARE_PROVIDER_SITE_OTHER): Payer: Federal, State, Local not specified - PPO

## 2017-10-28 ENCOUNTER — Encounter (HOSPITAL_COMMUNITY): Payer: Self-pay | Admitting: Emergency Medicine

## 2017-10-28 ENCOUNTER — Ambulatory Visit (HOSPITAL_COMMUNITY)
Admission: EM | Admit: 2017-10-28 | Discharge: 2017-10-28 | Disposition: A | Discharge status: Home / Self Care | Payer: Federal, State, Local not specified - PPO | Attending: Internal Medicine | Admitting: Internal Medicine

## 2017-10-28 DIAGNOSIS — J181 Lobar pneumonia, unspecified organism: Secondary | ICD-10-CM | POA: Diagnosis not present

## 2017-10-28 DIAGNOSIS — R05 Cough: Secondary | ICD-10-CM | POA: Diagnosis not present

## 2017-10-28 DIAGNOSIS — J189 Pneumonia, unspecified organism: Secondary | ICD-10-CM

## 2017-10-28 MED ORDER — AMOXICILLIN-POT CLAVULANATE 875-125 MG PO TABS: 1.0000 | ORAL_TABLET | Freq: Two times a day (BID) | ORAL | 0 refills | Status: AC

## 2017-10-28 MED ORDER — BENZONATATE 200 MG PO CAPS: 200.0000 mg | ORAL_CAPSULE | Freq: Three times a day (TID) | ORAL | 0 refills | Status: DC | PRN

## 2017-10-28 NOTE — ED Provider Notes (Signed)
[place]    CSN: [id number] Arrival date & time: 10/28/17  1026     History   Chief Complaint Chief Complaint  Patient presents with  . Cough    HPI Vicki [name] is a 57 y.o. female history of fatty liver disease, hyperlipidemia, hypertension, DM type II presenting today for evaluation of a cough.  She states on Thursday, approximately 3 she was drinking black seed oil and it went down the wrong way.  Since she has had a cough that has turned into a productive cough with some slight chest discomfort with breathing.  Denies any discomfort or chest pain at rest.  She notes that her cough is been productive with yellowish-green phlegm.  She has been taking Mucinex without relief.  Mild sore throat and mild rhinorrhea.  Denies any fevers.  Otherwise has been eating and drinking like normal since.  Denies any leg pain denies history of smoking.  Denies previous DVT/PE.  Denies recent travel or immobilization.  Denies exogenous hormone use.  HPI  Past Medical History:  Diagnosis Date  . Allergy   . Boil   . Diabetes ([place])    type 2  . Elevated liver enzymes   . Fatty liver   . Hyperlipemia   . Hypertension   . 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 PVC's (premature ventricular contractions)     Patient Active Problem List   Diagnosis Date Noted  . Palpitations 04/15/2015  . Controlled type 2 diabetes mellitus without complication ([place]) 60/63/0160  . Elevated liver enzymes 03/31/2014  . Tinea pedis 07/31/2013  . Foot and toe(s), blister, infected 07/31/2013  . 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 Pain in joint, ankle and foot 08/31/2012    Past Surgical History:  Procedure Laterality Date  . COLONOSCOPY  2014  . ECTOPIC PREGNANCY SURGERY  1990  . TONSILLECTOMY  1980    OB History    Gravida  1   Para  0   Term  0   Preterm  0   AB  1   Living  1     SAB  0   TAB  0   Ectopic  1   Multiple  0   Live Births  0            Home Medications    Prior to Admission medications   Medication Sig Start Date End Date Taking? Authorizing Provider  acetaminophen (TYLENOL) 500 MG tablet Take 500 mg by mouth as needed for pain.    [provider]  amoxicillin-clavulanate (AUGMENTIN) 875-125 MG tablet Take 1 tablet by mouth every 12 (twelve) hours for 10 days. 10/28/17 11/07/17  Darsi Tien C, PA-C  BENICAR HCT 40-25 MG per tablet Take 0.5 tablets by mouth daily. Half tab daily 09/02/12   [provider]  benzonatate (TESSALON) 200 MG capsule Take 1 capsule (200 mg total) by mouth 3 (three) times daily as needed for cough. 10/28/17   Sherice Ijames C, PA-C  Dulaglutide (TRULICITY) 1.09 NA/3.5TD SOPN Inject into the skin.    [provider]  fexofenadine (ALLEGRA) 180 MG tablet Take 180 mg by mouth daily.    [provider]  NONFORMULARY OR COMPOUNDED Post:  Combination Pain Cream + Ketamine - Baclofen 2%, Doxepin 5%, Gabapentin 6%, Topiramate 2%, Pentoxifylline 3%, Ketamine 10 %, apply 1-2 grams to affected area 3-4 times daily. 08/29/17   Landis Martins, DPM  ONE TOUCH ULTRA TEST  test strip  05/14/12   [provider]  traMADol (ULTRAM) 50 MG tablet as needed. 06/13/16   [provider]    Family History Family History  Problem Relation Age of Onset  . Hypertension Mother   . Diabetes Mother   . Heart attack Mother   . Hypertension Father   . Heart attack Sister 54  . Diabetes Sister   . Hypertension Sister   . Goiter Unknown   . Asthma Unknown   . Colon cancer Neg Hx     Social History Social History   Tobacco Use  . Smoking status: Never Smoker  . Smokeless tobacco: Never Used  Substance Use Topics  . Alcohol use: Yes    Alcohol/week: 3.0 standard drinks    Types: 3 Standard drinks or equivalent per week  . Drug use: No     Allergies   Sulfa antibiotics and Sulfasalazine   Review of Systems Review of Systems   Constitutional: Negative for activity change, appetite change, chills, fatigue and fever.  HENT: Positive for congestion, rhinorrhea and sore throat. Negative for ear pain, sinus pressure and trouble swallowing.   Eyes: Negative for discharge and redness.  Respiratory: Positive for cough. Negative for chest tightness and shortness of breath.   Cardiovascular: Positive for chest pain.  Gastrointestinal: Negative for abdominal pain, diarrhea, nausea and vomiting.  Musculoskeletal: Negative for myalgias.  Skin: Negative for rash.  Neurological: Negative for dizziness, light-headedness and headaches.     Physical Exam Triage Vital Signs ED Triage Vitals  Enc Vitals Group     BP 10/28/17 1144 118/73     Pulse Rate 10/28/17 1144 66     Resp 10/28/17 1144 16     Temp 10/28/17 1144 98.4 F (36.9 C)     Temp Source 10/28/17 1144 Oral     SpO2 10/28/17 1144 99 %     Weight --      Height --      Head Circumference --      Peak Flow --      Pain Score 10/28/17 1141 3     Pain Loc --      Pain Edu? --      Excl. in [place]? --    No data found.  Updated Vital Signs BP 118/73 (BP Location: Left Arm)   Pulse 66   Temp 98.4 F (36.9 C) (Oral)   Resp 16   LMP 04/30/2012   SpO2 99%   Visual Acuity Right Eye Distance:   Left Eye Distance:   Bilateral Distance:    Right Eye Near:   Left Eye Near:    Bilateral Near:     Physical Exam  Constitutional: She appears well-developed and well-nourished. No distress.  HENT:  Head: Normocephalic and atraumatic.  Mouth/Throat: Oropharynx is clear and moist.  Bilateral ears without tenderness to palpation of external auricle, tragus and mastoid, EAC's without erythema or swelling, TM's with good bony landmarks and cone of light. Non erythematous.  Oral mucosa pink and moist, no tonsillar enlargement or exudate. Posterior pharynx patent and nonerythematous, no uvula deviation or swelling. Normal phonation.  Eyes: Conjunctivae are normal.   Neck: Neck supple.  Cardiovascular: Normal rate and regular rhythm.  No murmur heard. Pulmonary/Chest: Effort normal and breath sounds normal. No respiratory distress.  Breathing comfortably at rest, CTABL, no wheezing, rales or other adventitious sounds auscultated  Frequent productive coughing in room  Abdominal: Soft. There is no tenderness.  Musculoskeletal: She exhibits no  edema.  No lower extremity swelling, no calf tenderness, negative Homans  Neurological: She is alert.  Skin: Skin is warm and dry.  Psychiatric: She has a normal mood and affect.  Nursing note and vitals reviewed.    UC Treatments / Results  Labs (all labs ordered are listed, but only abnormal results are displayed) Labs Reviewed - No data to display  EKG None  Radiology Dg Chest 2 View  Result Date: 10/28/2017 CLINICAL DATA:  Cough, 3 days duration. EXAM: CHEST - 2 VIEW COMPARISON:  04/11/2015 FINDINGS: Minimal patchy density in the right middle lobe consistent with minimal pneumonia. The remainder the chest is clear. No effusions. No bone abnormalities. IMPRESSION: Minimal right middle lobe pneumonia. Electronically Signed   By: Nelson Chimes M.D.   On: 10/28/2017 12:48    Procedures Procedures (including critical care time)  Medications Ordered in UC Medications - No data to display  Initial Impression / Assessment and Plan / UC Course  I have reviewed the triage vital signs and the nursing notes.  Pertinent labs & imaging results that were available during my care of the patient were reviewed by me and considered in my medical decision making (see chart for details).     Small right middle lobe pneumonia, will treat with Augmentin given concerns for possible aspiration pneumonia.  May continue Mucinex.  Tessalon for cough.Discussed strict return precautions. Patient verbalized understanding and is agreeable with plan.  Final Clinical Impressions(s) / UC Diagnoses   Final diagnoses:   Community acquired pneumonia of right middle lobe of lung ([place])     Discharge Instructions     Small pneumonia seen on right side  Please begin Augmentin twice daily for the next week  Please continue to use Mucinex to help break up phlegm May use Tessalon to help with cough  Please return if symptoms not improving or symptoms worsening    ED Prescriptions    Medication Sig Dispense Auth. Provider   amoxicillin-clavulanate (AUGMENTIN) 875-125 MG tablet Take 1 tablet by mouth every 12 (twelve) hours for 10 days. 20 tablet Gaither Biehn C, PA-C   benzonatate (TESSALON) 200 MG capsule Take 1 capsule (200 mg total) by mouth 3 (three) times daily as needed for cough. 20 capsule Raekwon Winkowski C, PA-C     Controlled Substance Prescriptions [place] Controlled Substance Registry consulted? Not Applicable   Janith Lima, [place] 10/28/17 1317

## 2017-10-28 NOTE — ED Triage Notes (Signed)
On Thursday strangled on flax seed oil.  Has been coughing since then.  Coughing up green phlegm, chest soreness.  No fever.

## 2017-10-28 NOTE — Discharge Instructions (Addendum)
Small pneumonia seen on right side  Please begin Augmentin twice daily for the next week  Please continue to use Mucinex to help break up phlegm May use Tessalon to help with cough  Please return if symptoms not improving or symptoms worsening

## 2017-10-30 ENCOUNTER — Encounter: Payer: Self-pay | Admitting: Hematology

## 2017-10-30 ENCOUNTER — Telehealth: Payer: Self-pay | Admitting: Hematology

## 2017-10-30 NOTE — Telephone Encounter (Signed)
New referral received from Dr. Maudie Mercury at [facility] for leukopenia. Pt has been scheduled to see Dr. Irene Limbo on 9/23 at 1pm. Pt aware to arrive 30 minutes early. Letter mailed.

## 2017-11-17 DIAGNOSIS — J189 Pneumonia, unspecified organism: Secondary | ICD-10-CM | POA: Diagnosis not present

## 2017-11-27 ENCOUNTER — Inpatient Hospital Stay: Payer: Federal, State, Local not specified - PPO | Attending: Hematology | Admitting: Hematology

## 2017-11-27 ENCOUNTER — Inpatient Hospital Stay: Payer: Federal, State, Local not specified - PPO

## 2017-11-27 ENCOUNTER — Encounter: Payer: Self-pay | Admitting: Hematology

## 2017-11-27 ENCOUNTER — Telehealth: Payer: Self-pay | Admitting: Hematology

## 2017-11-27 VITALS — BP 153/87 | HR 81 | Temp 98.3°F | Resp 18 | Ht 62.0 in | Wt 161.9 lb

## 2017-11-27 DIAGNOSIS — I10 Essential (primary) hypertension: Secondary | ICD-10-CM | POA: Insufficient documentation

## 2017-11-27 DIAGNOSIS — D72819 Decreased white blood cell count, unspecified: Secondary | ICD-10-CM

## 2017-11-27 DIAGNOSIS — D72810 Lymphocytopenia: Secondary | ICD-10-CM

## 2017-11-27 DIAGNOSIS — E538 Deficiency of other specified B group vitamins: Secondary | ICD-10-CM | POA: Diagnosis not present

## 2017-11-27 DIAGNOSIS — D696 Thrombocytopenia, unspecified: Secondary | ICD-10-CM

## 2017-11-27 DIAGNOSIS — K7460 Unspecified cirrhosis of liver: Secondary | ICD-10-CM | POA: Diagnosis not present

## 2017-11-27 DIAGNOSIS — Z79899 Other long term (current) drug therapy: Secondary | ICD-10-CM

## 2017-11-27 DIAGNOSIS — E119 Type 2 diabetes mellitus without complications: Secondary | ICD-10-CM | POA: Insufficient documentation

## 2017-11-27 DIAGNOSIS — D731 Hypersplenism: Secondary | ICD-10-CM | POA: Diagnosis not present

## 2017-11-27 DIAGNOSIS — E785 Hyperlipidemia, unspecified: Secondary | ICD-10-CM | POA: Diagnosis not present

## 2017-11-27 LAB — CMP (CANCER CENTER ONLY)
ALT: 131 U/L — ABNORMAL HIGH (ref 0–44)
AST: 162 U/L — ABNORMAL HIGH (ref 15–41)
Albumin: 4.3 g/dL (ref 3.5–5.0)
Alkaline Phosphatase: 122 U/L (ref 38–126)
Anion gap: 8 (ref 5–15)
BUN: 7 mg/dL (ref 6–20)
CO2: 29 mmol/L (ref 22–32)
Calcium: 10 mg/dL (ref 8.9–10.3)
Chloride: 104 mmol/L (ref 98–111)
Creatinine: 0.81 mg/dL (ref 0.44–1.00)
GFR, Est AFR Am: >60 mL/min (ref >60)
GFR, Estimated: >60 mL/min (ref >60)
Glucose, Bld: 116 mg/dL — ABNORMAL HIGH (ref 70–99)
Potassium: 4 mmol/L (ref 3.5–5.1)
Sodium: 141 mmol/L (ref 135–145)
Total Bilirubin: 1 mg/dL (ref 0.3–1.2)
Total Protein: 8.5 g/dL — ABNORMAL HIGH (ref 6.5–8.1)

## 2017-11-27 LAB — CBC WITH DIFFERENTIAL/PLATELET
Basophils Absolute: 0 10*3/uL (ref 0.0–0.1)
Basophils Relative: 0 %
Eosinophils Absolute: 0.2 10*3/uL (ref 0.0–0.5)
Eosinophils Relative: 6 %
HCT: 38.1 % (ref 34.8–46.6)
Hemoglobin: 12.8 g/dL (ref 11.6–15.9)
Lymphocytes Relative: 28 %
Lymphs Abs: 0.8 10*3/uL — ABNORMAL LOW (ref 0.9–3.3)
MCH: 30.1 pg (ref 25.1–34.0)
MCHC: 33.6 g/dL (ref 31.5–36.0)
MCV: 89.6 fL (ref 79.5–101.0)
Monocytes Absolute: 0.2 10*3/uL (ref 0.1–0.9)
Monocytes Relative: 8 %
Neutro Abs: 1.6 10*3/uL (ref 1.5–6.5)
Neutrophils Relative %: 58 %
Platelets: 105 10*3/uL — ABNORMAL LOW (ref 145–400)
RBC: 4.25 MIL/uL (ref 3.70–5.45)
RDW: 13.5 % (ref 11.2–14.5)
WBC: 2.8 10*3/uL — ABNORMAL LOW (ref 3.9–10.3)

## 2017-11-27 LAB — VITAMIN B12: Vitamin B-12: 724 pg/mL (ref 180–914)

## 2017-11-27 NOTE — Telephone Encounter (Signed)
Appts scheduled avs/calendar printed per 9/23 los

## 2017-11-27 NOTE — Progress Notes (Signed)
HEMATOLOGY/ONCOLOGY CONSULTATION NOTE  Date of Service: 11/27/2017  Patient Care Team: Jani Gravel, MD as PCP - General (Internal Medicine)  CHIEF COMPLAINTS/PURPOSE OF CONSULTATION:  Leukopenia   HISTORY OF PRESENTING ILLNESS:  Vicki [name] is a wonderful 57 y.o. female who has been referred to [place] by Dr. Jani Gravel  for evaluation and management of Leukopenia.. The pt reports that she is doing well overall.   The pt reports that she has lost six pounds over the last 4 months and endorses that she has modestly tried to lose weight. The pt also notes that she has sinus headaches. She had pneumonia on 10/28/17 as well.   She denies any problems bleeding.   The pt sees [facility] for management of her liver cirrhosis.  She began Trulicity in the last year after taking metformin for many years. She was stopped on metformin with concerns for her liver disease. She notes that she had Vitamin B12 deficiency, had it replaced, and then saw that the B12 was too high.   The pt notes that she was told that she did not have Sarcoidosis when she saw [facility]. .   The pt notes that she has been taking vinegar instead of her HCTZ for her blood pressure management.   Most recent lab results (10/06/17) of CBC w/diff  is as follows: all values are WNL except for WBC at 2.5k, PLT at 109k.  On review of systems, pt reports stable energy levels, mild intentional weight loss, sinus headaches, recent pneumonia, and denies nose bleeds, gum bleeds, concerns for bleeding, frequent infections, fevers, chills, night sweats, unexpected weight loss, abdominal pains, blood in the stools, and any other symptoms.   On PMHx the pt reports DM, HTN, Cirrhosis. On Social Hx the pt reports having a shot of vodka twice a month   MEDICAL HISTORY:  Past Medical History:  Diagnosis Date  . Allergy   . Boil   . Diabetes ([place])    type 2  . Elevated liver enzymes   . Fatty liver   . Hyperlipemia   . Hypertension    . 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 PVC's (premature ventricular contractions)     SURGICAL HISTORY: Past Surgical History:  Procedure Laterality Date  . COLONOSCOPY  2014  . ECTOPIC PREGNANCY SURGERY  1990  . TONSILLECTOMY  1980  . UPPER GI ENDOSCOPY  08/2017   [facility]    SOCIAL HISTORY: Social History   Socioeconomic History  . Marital status: Single    Spouse name: Not on file  . Number of children: Not on file  . Years of education: Not on file  . Highest education level: Not on file  Occupational History  . Not on file  Social Needs  . Financial resource strain: Not on file  . Food insecurity:    Worry: Not on file    Inability: Not on file  . Transportation needs:    Medical: Not on file    Non-medical: Not on file  Tobacco Use  . Smoking status: Never Smoker  . Smokeless tobacco: Never Used  Substance and Sexual Activity  . Alcohol use: Yes    Alcohol/week: 3.0 standard drinks    Types: 3 Standard drinks or equivalent per week    Comment: occasional  . Drug use: No  . Sexual activity: Yes    Birth control/protection: Condom  Lifestyle  . Physical activity:    Days per  week: Not on file    Minutes per session: Not on file  . Stress: Not on file  Relationships  . Social connections:    Talks on phone: Not on file    Gets together: Not on file    Attends religious service: Not on file    Active member of club or organization: Not on file    Attends meetings of clubs or organizations: Not on file    Relationship status: Not on file  . Intimate partner violence:    Fear of current or ex partner: Not on file    Emotionally abused: Not on file    Physically abused: Not on file    Forced sexual activity: Not on file  Other Topics Concern  . Not on file  Social History Narrative   Epworth Sleepiness Scale = 3 (as of 04/15/2015)    FAMILY HISTORY: Family History  Problem Relation Age of Onset  . Hypertension Mother    . Diabetes Mother   . Heart attack Mother   . Asthma Mother        Childhood  . Hypertension Sister   . Diabetes Sister   . Asthma Sister   . Diabetes Brother   . Hypertension Brother   . Diabetes Brother   . Colon cancer Neg Hx     ALLERGIES:  is allergic to sulfa antibiotics and sulfasalazine.  MEDICATIONS:  Current Outpatient Medications  Medication Sig Dispense Refill  . acetaminophen (TYLENOL) 500 MG tablet Take 500 mg by mouth as needed for pain.    . benzonatate (TESSALON) 200 MG capsule Take 1 capsule (200 mg total) by mouth 3 (three) times daily as needed for cough. 20 capsule 0  . BLACK CURRANT SEED OIL PO Take by mouth daily. Pt takes 1-2 tsp liquid daily.    . diphenhydrAMINE (BENADRYL) 12.5 MG/5ML liquid Take by mouth as needed. Pt takes 1-2 tsp twice daily.    . Dulaglutide (TRULICITY) 4.65 KP/5.4SF SOPN Inject into the skin.    . fexofenadine (ALLEGRA) 180 MG tablet Take 180 mg by mouth daily.    . Multiple Vitamins-Minerals (MULTIVITAMIN ADULT) TABS Take 1 tablet by mouth daily.    . NONFORMULARY OR COMPOUNDED ITEM [facility]:  Combination Pain Cream + Ketamine - Baclofen 2%, Doxepin 5%, Gabapentin 6%, Topiramate 2%, Pentoxifylline 3%, Ketamine 10 %, apply 1-2 grams to affected area 3-4 times daily. 120 each 2  . ONE TOUCH ULTRA TEST test strip     . traMADol (ULTRAM) 50 MG tablet as needed.    . Turmeric POWD by Does not apply route. Pt takes 1-2 times weekly.    [name] BENICAR HCT 40-25 MG per tablet Take 0.5 tablets by mouth daily. Half tab daily     No current facility-administered medications for this visit.     REVIEW OF SYSTEMS:    10 Point review of Systems was done is negative except as noted above.  PHYSICAL EXAMINATION:  . Vitals:   11/27/17 1308  BP: (!) 153/87  Pulse: 81  Resp: 18  Temp: 98.3 F (36.8 C)  SpO2: 100%   Filed Weights   11/27/17 1308  Weight: 161 lb 14.4 oz (73.4 kg)   .Body mass index is 29.61  kg/m.  GENERAL:alert, in no acute distress and comfortable SKIN: no acute rashes, no significant lesions EYES: conjunctiva are pink and non-injected, sclera anicteric OROPHARYNX: MMM, no exudates, no oropharyngeal erythema or ulceration NECK: supple, no JVD LYMPH:  no palpable lymphadenopathy  in the cervical, axillary or inguinal regions LUNGS: clear to auscultation b/l with normal respiratory effort HEART: regular rate & rhythm ABDOMEN:  normoactive bowel sounds , non tender, not distended. Extremity: no pedal edema PSYCH: alert & oriented x 3 with fluent speech NEURO: no focal motor/sensory deficits  LABORATORY DATA:  I have reviewed the data as listed  . CBC Latest Ref Rng & Units 11/27/2017 11/27/2017 04/11/2015  WBC 3.9 - 10.3 K/uL 2.8(L) - 4.9  Hemoglobin 11.6 - 15.9 g/dL 12.8 - 13.4  Hematocrit 34.0 - 46.6 % 38.1 38.7 39.1  Platelets 145 - 400 K/uL 105(L) - 165   ANC 1.6k . CBC    Component Value Date/Time   WBC 2.8 (L) 11/27/2017 1418   RBC 4.25 11/27/2017 1418   HGB 12.8 11/27/2017 1418   HCT 38.1 11/27/2017 1418   HCT 38.7 11/27/2017 1418   PLT 105 (L) 11/27/2017 1418   MCV 89.6 11/27/2017 1418   MCH 30.1 11/27/2017 1418   MCHC 33.6 11/27/2017 1418   RDW 13.5 11/27/2017 1418   LYMPHSABS 0.8 (L) 11/27/2017 1418   MONOABS 0.2 11/27/2017 1418   EOSABS 0.2 11/27/2017 1418   BASOSABS 0.0 11/27/2017 1418    . CMP Latest Ref Rng & Units 11/27/2017 03/08/2016 09/25/2015  Glucose 70 - 99 mg/dL 116(H) - 99  BUN 6 - 20 mg/dL 7 - 9  Creatinine 0.44 - 1.00 mg/dL 0.81 0.70 0.70  Sodium 135 - 145 mmol/L 141 - 141  Potassium 3.5 - 5.1 mmol/L 4.0 - 4.0  Chloride 98 - 111 mmol/L 104 - 105  CO2 22 - 32 mmol/L 29 - 26  Calcium 8.9 - 10.3 mg/dL 10.0 - 9.6  Total Protein 6.5 - 8.1 g/dL 8.5(H) - -  Total Bilirubin 0.3 - 1.2 mg/dL 1.0 - -  Alkaline Phos 38 - 126 U/L 122 - -  AST 15 - 41 U/L 162(H) - -  ALT 0 - 44 U/L 131(H) - -   10/06/17 CBC w/diff:     RADIOGRAPHIC  STUDIES: I have personally reviewed the radiological images as listed and agreed with the findings in the report. No results found. 04/18/17 CT Cirrhosis Abdomen     ASSESSMENT & PLAN:  57 y.o. female with  1. Leukopenia - lymphopenia . Not neutropenic 2. Thrombocytopenia PLAN -Discussed patient's most recent labs from 10/06/17, HGB normal, WBC at 2.5k, PLT at 109k. Ferritin at 508 -Discussed and reviewed 04/18/17 CT Cirrhosis Abdomen which revealed perisplenic varices and a splenorenal shunt with concern for portal hypertension.  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 Maudie Mercury -Discussed that we could evaluate hypersplenism with NM imaging- liver /spleen scan which the pt prefers  -If counts drop more significantly, would consider BM Bx evaluation -Will order blood tests today    Labs today Liver spleen scan in 1 week RTC with Dr Irene Limbo in 4 months with labs   All of the patients questions were answered with apparent satisfaction. The patient knows to call the clinic with any problems, questions or concerns.  The total time spent in the appt was 45 minutes and more than 50% was on counseling and direct patient cares.    Sullivan Lone MD [place] AAHIVMS [facility] Hematology/Oncology  Physician [facility]  (Office):       [phone] (Work cell):  [phone] (Fax):           [phone]  11/27/2017 2:08 PM  I, Baldwin [place], am acting as a scribe for Dr. Irene Limbo  .I have reviewed the above documentation for accuracy and completeness, and I agree with the above. Brunetta Genera MD

## 2017-11-28 DIAGNOSIS — J189 Pneumonia, unspecified organism: Secondary | ICD-10-CM | POA: Diagnosis not present

## 2017-11-28 DIAGNOSIS — Z09 Encounter for follow-up examination after completed treatment for conditions other than malignant neoplasm: Secondary | ICD-10-CM | POA: Diagnosis not present

## 2017-11-28 LAB — FOLATE RBC
Folate, Hemolysate: 398.8 ng/mL
Folate, RBC: 1030 ng/mL (ref >498)
Hematocrit: 38.7 % (ref 34.0–46.6)

## 2017-11-29 LAB — COPPER, SERUM: Copper: 136 ug/dL (ref 72–166)

## 2017-11-29 LAB — METHYLMALONIC ACID, SERUM: Methylmalonic Acid, Quantitative: 96 nmol/L (ref 0–378)

## 2017-12-11 ENCOUNTER — Ambulatory Visit (HOSPITAL_COMMUNITY)
Admission: RE | Admit: 2017-12-11 | Discharge: 2017-12-11 | Disposition: A | Discharge status: Home / Self Care | Payer: Federal, State, Local not specified - PPO | Source: Ambulatory Visit | Attending: Hematology | Admitting: Hematology

## 2017-12-11 DIAGNOSIS — D731 Hypersplenism: Secondary | ICD-10-CM | POA: Diagnosis not present

## 2017-12-11 DIAGNOSIS — K7460 Unspecified cirrhosis of liver: Secondary | ICD-10-CM | POA: Diagnosis not present

## 2017-12-11 MED ORDER — TECHNETIUM TC 99M SULFUR COLLOID
4.0000 | Freq: Once | INTRAVENOUS | Status: AC | PRN
  Administered 2017-12-11: 439 via INTRAVENOUS

## 2017-12-15 ENCOUNTER — Ambulatory Visit: Payer: Federal, State, Local not specified - PPO | Admitting: Obstetrics & Gynecology

## 2017-12-18 ENCOUNTER — Telehealth: Payer: Self-pay | Admitting: *Deleted

## 2017-12-18 NOTE — Telephone Encounter (Signed)
Patient wanting to know when last pap smear was done, and when next one due. No paper chart from [facility] has arrived. Only seen here for problems. I told patient to contact [facility] to have paper chart sent here.

## 2017-12-19 ENCOUNTER — Ambulatory Visit: Payer: Federal, State, Local not specified - PPO | Admitting: Obstetrics & Gynecology

## 2017-12-20 DIAGNOSIS — G43109 Migraine with aura, not intractable, without status migrainosus: Secondary | ICD-10-CM | POA: Diagnosis not present

## 2017-12-20 DIAGNOSIS — H40013 Open angle with borderline findings, low risk, bilateral: Secondary | ICD-10-CM | POA: Diagnosis not present

## 2017-12-20 DIAGNOSIS — H10413 Chronic giant papillary conjunctivitis, bilateral: Secondary | ICD-10-CM | POA: Diagnosis not present

## 2017-12-20 DIAGNOSIS — E119 Type 2 diabetes mellitus without complications: Secondary | ICD-10-CM | POA: Diagnosis not present

## 2017-12-21 DIAGNOSIS — M545 Low back pain, unspecified: Secondary | ICD-10-CM | POA: Insufficient documentation

## 2017-12-21 DIAGNOSIS — M5417 Radiculopathy, lumbosacral region: Secondary | ICD-10-CM | POA: Diagnosis not present

## 2017-12-29 DIAGNOSIS — M545 Low back pain: Secondary | ICD-10-CM | POA: Diagnosis not present

## 2018-01-09 DIAGNOSIS — I10 Essential (primary) hypertension: Secondary | ICD-10-CM | POA: Diagnosis not present

## 2018-01-09 DIAGNOSIS — K7689 Other specified diseases of liver: Secondary | ICD-10-CM | POA: Diagnosis not present

## 2018-01-09 DIAGNOSIS — Z79899 Other long term (current) drug therapy: Secondary | ICD-10-CM | POA: Diagnosis not present

## 2018-01-09 DIAGNOSIS — E118 Type 2 diabetes mellitus with unspecified complications: Secondary | ICD-10-CM | POA: Diagnosis not present

## 2018-01-09 DIAGNOSIS — E119 Type 2 diabetes mellitus without complications: Secondary | ICD-10-CM | POA: Diagnosis not present

## 2018-01-16 DIAGNOSIS — R7989 Other specified abnormal findings of blood chemistry: Secondary | ICD-10-CM | POA: Diagnosis not present

## 2018-01-16 DIAGNOSIS — R51 Headache: Secondary | ICD-10-CM | POA: Diagnosis not present

## 2018-01-16 DIAGNOSIS — E119 Type 2 diabetes mellitus without complications: Secondary | ICD-10-CM | POA: Diagnosis not present

## 2018-01-16 DIAGNOSIS — R748 Abnormal levels of other serum enzymes: Secondary | ICD-10-CM | POA: Diagnosis not present

## 2018-01-17 DIAGNOSIS — M545 Low back pain: Secondary | ICD-10-CM | POA: Diagnosis not present

## 2018-01-17 DIAGNOSIS — M5136 Other intervertebral disc degeneration, lumbar region: Secondary | ICD-10-CM | POA: Diagnosis not present

## 2018-01-17 DIAGNOSIS — M48061 Spinal stenosis, lumbar region without neurogenic claudication: Secondary | ICD-10-CM | POA: Diagnosis not present

## 2018-01-23 DIAGNOSIS — G43109 Migraine with aura, not intractable, without status migrainosus: Secondary | ICD-10-CM | POA: Diagnosis not present

## 2018-01-23 DIAGNOSIS — J3489 Other specified disorders of nose and nasal sinuses: Secondary | ICD-10-CM | POA: Diagnosis not present

## 2018-01-23 DIAGNOSIS — R0981 Nasal congestion: Secondary | ICD-10-CM | POA: Diagnosis not present

## 2018-02-15 ENCOUNTER — Ambulatory Visit (HOSPITAL_COMMUNITY)
Admission: EM | Admit: 2018-02-15 | Discharge: 2018-02-15 | Disposition: A | Discharge status: Home / Self Care | Payer: Federal, State, Local not specified - PPO | Attending: Family Medicine | Admitting: Family Medicine

## 2018-02-15 ENCOUNTER — Encounter (HOSPITAL_COMMUNITY): Payer: Self-pay | Admitting: Emergency Medicine

## 2018-02-15 ENCOUNTER — Other Ambulatory Visit: Payer: Self-pay

## 2018-02-15 DIAGNOSIS — B9789 Other viral agents as the cause of diseases classified elsewhere: Secondary | ICD-10-CM | POA: Insufficient documentation

## 2018-02-15 DIAGNOSIS — J069 Acute upper respiratory infection, unspecified: Secondary | ICD-10-CM | POA: Diagnosis not present

## 2018-02-15 DIAGNOSIS — B309 Viral conjunctivitis, unspecified: Secondary | ICD-10-CM

## 2018-02-15 MED ORDER — OLOPATADINE HCL 0.2 % OP SOLN: 1.0000 [drp] | Freq: Every day | OPHTHALMIC | 0 refills | Status: DC

## 2018-02-15 MED ORDER — HYDROCODONE-HOMATROPINE 5-1.5 MG/5ML PO SYRP
5.0000 mL | ORAL_SOLUTION | Freq: Four times a day (QID) | ORAL | 0 refills | Status: DC | PRN
Start: 2018-02-15 — End: 2018-04-09

## 2018-02-15 NOTE — ED Triage Notes (Signed)
PT reports cough and drainage that is worse at night - started Sunday.   Left eye redness and discomfort that started 3 days ago.

## 2018-02-15 NOTE — ED Provider Notes (Signed)
[facility]   [id number] 02/15/18 Arrival Time: 7628  ASSESSMENT & PLAN:  1. Viral URI with cough   2. Acute viral conjunctivitis of left eye    See AVS for discharge instructions.  Meds ordered this encounter  Medications  . HYDROcodone-homatropine (HYCODAN) 5-1.5 MG/5ML syrup    Sig: Take 5 mLs by mouth every 6 (six) hours as needed for cough.    Dispense:  90 mL    Refill:  0  . Olopatadine HCl 0.2 % SOLN    Sig: Apply 1 drop to eye daily.    Dispense:  2.5 mL    Refill:  0   No concern for pneumonia. No indication for chest imaging at this time. Discussed.  Cough medication sedation precautions. Discussed typical duration of symptoms. OTC symptom care as needed. Ensure adequate fluid intake and rest. May f/u with PCP or here as needed.  Reviewed expectations re: course of current medical issues. Questions answered. Outlined signs and symptoms indicating need for more acute intervention. Patient verbalized understanding. After Visit Summary given.   SUBJECTIVE: History from: patient.  Vicki [name] is a 57 y.o. female who presents with complaint of nasal congestion, post-nasal drainage, and a persistent dry cough. Onset abrupt, 4-5 days ago. Overall with fatigue and with mild body aches. SOB: none. Wheezing: none. Fever: questions subjective. Overall normal PO intake without n/v. Sick contacts: none known. No specific or significant aggravating or alleviating factors reported. OTC treatment: none reported. Received flu shot this year: no. She has questions regarding concern for pneumonia. Dx with pneumonia in 10/2017.  Also reports L eye redness and irritation beginning about 2-3 days ago. Stable. No eye pain. No vision changes. Does not wear contact lenses.  Social History   Tobacco Use  Smoking Status Never Smoker  Smokeless Tobacco Never Used   ROS: As per HPI. All other systems negative.    OBJECTIVE:  Vitals:   02/15/18 1803 02/15/18 1804    BP:  (!) 150/79  Pulse: 69   Resp: 16   Temp: 98.4 F (36.9 C)   TempSrc: Oral   SpO2: 100%      General appearance: alert; appears fatigued HEENT: nasal congestion; clear runny nose; throat irritation secondary to post-nasal drainage; PERRLA; EOMI; L conjunctival 1+ injection with watery drainage Neck: supple without LAD CV: RRR without murmer Lungs: unlabored respirations, symmetrical air entry without wheezing; cough: moderate and non-productive Skin: warm and dry Psychological: alert and cooperative; normal mood and affect   Allergies  Allergen Reactions  . Sulfa Antibiotics Itching  . Sulfasalazine Itching    Past Medical History:  Diagnosis Date  . Allergy   . Boil   . Diabetes ([place])    type 2  . Elevated liver enzymes   . Fatty liver   . Hyperlipemia   . Hypertension   . 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 PVC's (premature ventricular contractions)    Family History  Problem Relation Age of Onset  . Hypertension Mother   . Diabetes Mother   . Heart attack Mother   . Asthma Mother        Childhood  . Hypertension Sister   . Diabetes Sister   . Asthma Sister   . Diabetes Brother   . Hypertension Brother   . Diabetes Brother   . Colon cancer Neg Hx    Social History   Socioeconomic History  . Marital status: Single    Spouse name: Not  on file  . Number of children: Not on file  . Years of education: Not on file  . Highest education level: Not on file  Occupational History  . Not on file  Social Needs  . Financial resource strain: Not on file  . Food insecurity:    Worry: Not on file    Inability: Not on file  . Transportation needs:    Medical: Not on file    Non-medical: Not on file  Tobacco Use  . Smoking status: Never Smoker  . Smokeless tobacco: Never Used  Substance and Sexual Activity  . Alcohol use: Yes    Alcohol/week: 3.0 standard drinks    Types: 3 Standard drinks or equivalent per week    Comment: occasional   . Drug use: No  . Sexual activity: Yes    Birth control/protection: Condom  Lifestyle  . Physical activity:    Days per week: Not on file    Minutes per session: Not on file  . Stress: Not on file  Relationships  . Social connections:    Talks on phone: Not on file    Gets together: Not on file    Attends religious service: Not on file    Active member of club or organization: Not on file    Attends meetings of clubs or organizations: Not on file    Relationship status: Not on file  . Intimate partner violence:    Fear of current or ex partner: Not on file    Emotionally abused: Not on file    Physically abused: Not on file    Forced sexual activity: Not on file  Other Topics Concern  . Not on file  Social History Narrative   Epworth Sleepiness Scale = 3 (as of 04/15/2015)           Vanessa Kick, MD 02/21/18 Bosie Helper

## 2018-02-15 NOTE — Discharge Instructions (Signed)
Be aware, your cough medication may cause drowsiness. Please do not drive, operate heavy machinery or make important decisions while on this medication, it can cloud your judgement.  Follow up with your primary care doctor or here if you are not seeing improvement of your symptoms over the next several days, sooner if you feel you are worsening.  Caring for yourself: Get plenty of rest. Drink plenty of fluids, enough so that your urine is light yellow or clear like water. If you have kidney, heart, or liver disease and have to limit fluids, talk with your doctor before you increase the amount of fluids you drink. Take an over-the-counter pain medicine if needed, such as acetaminophen (Tylenol), ibuprofen (Advil, Motrin), or naproxen (Aleve), to relieve fever, headache, and muscle aches. Read and follow all instructions on the label. No one younger than 20 should take aspirin. It has been linked to Reye syndrome, a serious illness. Before you use over the counter cough and cold medicines, check the label. These medicines may not be safe for children younger than age 33 or for people with certain health problems. If the skin around your nose and lips becomes sore, put some petroleum jelly on the area.  Avoid spreading the a viral illness: Wash your hands regularly, and keep your hands away from your face.  Stay home from school, work, and other public places until you are feeling better and your fever has been gone for at least 24 hours. The fever needs to have gone away on its own without the help of medicine.

## 2018-02-20 ENCOUNTER — Ambulatory Visit: Payer: Federal, State, Local not specified - PPO | Admitting: Cardiovascular Disease

## 2018-02-23 DIAGNOSIS — H15112 Episcleritis periodica fugax, left eye: Secondary | ICD-10-CM | POA: Diagnosis not present

## 2018-03-06 DIAGNOSIS — H15112 Episcleritis periodica fugax, left eye: Secondary | ICD-10-CM | POA: Diagnosis not present

## 2018-03-14 DIAGNOSIS — L0292 Furuncle, unspecified: Secondary | ICD-10-CM | POA: Diagnosis not present

## 2018-03-30 NOTE — Progress Notes (Signed)
HEMATOLOGY/ONCOLOGY CONSULTATION NOTE  Date of Service: 04/02/2018  Patient Care Team: [name], DO as PCP - General (Family Medicine)  Dr. Doristine Devoid in GI at [place]:  Leukopenia   HISTORY OF PRESENTING ILLNESS:  Vicki [name] is a wonderful 58 y.o. female who has been referred to [place] by Dr. Jani Gravel  for evaluation and management of Leukopenia.. The pt reports that she is doing well overall.   The pt reports that she has lost six pounds over the last 4 months and endorses that she has modestly tried to lose weight. The pt also notes that she has sinus headaches. She had pneumonia on 10/28/17 as well.   She denies any problems bleeding.   The pt sees [facility] for management of her liver cirrhosis.  She began Trulicity in the last year after taking metformin for many years. She was stopped on metformin with concerns for her liver disease. She notes that she had Vitamin B12 deficiency, had it replaced, and then saw that the B12 was too high.   The pt notes that she was told that she did not have Sarcoidosis when she saw [facility]. .   The pt notes that she has been taking vinegar instead of her HCTZ for her blood pressure management.   Most recent lab results (10/06/17) of CBC w/diff  is as follows: all values are WNL except for WBC at 2.5k, PLT at 109k.  On review of systems, pt reports stable energy levels, mild intentional weight loss, sinus headaches, recent pneumonia, and denies nose bleeds, gum bleeds, concerns for bleeding, frequent infections, fevers, chills, night sweats, unexpected weight loss, abdominal pains, blood in the stools, and any other symptoms.   On PMHx the pt reports DM, HTN, Cirrhosis. On Social Hx the pt reports having a shot of vodka twice a month  Interval History:   [name] returns today for management and evaluation of her leukopenia. The patient's last visit with [place] was on  11/27/17. The pt reports that she is doing well overall. Her new PCP is Dr. [name].   The pt reports that she has been using doxycycline recently for some skin rashes that were in her groin, and notes that her rashes are improving. She notes that genital herpes were ruled out with her PCP, and per the pt her PCP is concerned for infected hair follicles. The pt notes that she has had a recent cold sore, and used an exfoliating agent on her lips prior to this.  The pt notes that she doesn't have a follow up with GI and hasn't had a repeat CT scan in the interim.  The pt denies fevers, chills, night sweats or unexpected weight loss.  The pt notes that she recently stopped taking Trulicity and Benicar as they upset her stomach..  Of note since the patient's last visit, pt has had an NM Liver Spleen Scan completed on 12/11/17 with results revealing Normal colloid distribution between the liver, spleen and bone marrow, without evidence of colloid shift or hypersplenism. Questionable space-occupying lesion versus inhomogeneous uptake of tracer in the lateral segment LEFT lobe liver, unable to exclude mass; followup MR imaging with and without contrast recommended to exclude hepatic mass lesion.  Lab results today (04/02/18) of CBC w/diff and CMP is as follows: all values are WNL except for WBC at 2.3k, PLT at 104k, ANC at 1.1k, Glucose at 168, Total Protein at 8.3, AST  at 74, ALT at 71.  On review of systems, pt reports recent inguinal rash, recent cold sore, stable energy levels, and denies fevers, chills, night sweats, unexpected weight loss, abdominal pains, and any other symptoms.  MEDICAL HISTORY:  Past Medical History:  Diagnosis Date  . Allergy   . Boil   . Diabetes ([place])    type 2  . Elevated liver enzymes   . Fatty liver   . Hyperlipemia   . Hypertension   . 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 PVC's (premature ventricular contractions)     SURGICAL HISTORY: Past  Surgical History:  Procedure Laterality Date  . COLONOSCOPY  2014  . ECTOPIC PREGNANCY SURGERY  1990  . TONSILLECTOMY  1980  . UPPER GI ENDOSCOPY  08/2017   [facility]    SOCIAL HISTORY: Social History   Socioeconomic History  . Marital status: Single    Spouse name: Not on file  . Number of children: Not on file  . Years of education: Not on file  . Highest education level: Not on file  Occupational History  . Not on file  Social Needs  . Financial resource strain: Not on file  . Food insecurity:    Worry: Not on file    Inability: Not on file  . Transportation needs:    Medical: Not on file    Non-medical: Not on file  Tobacco Use  . Smoking status: Never Smoker  . Smokeless tobacco: Never Used  Substance and Sexual Activity  . Alcohol use: Yes    Alcohol/week: 3.0 standard drinks    Types: 3 Standard drinks or equivalent per week    Comment: occasional  . Drug use: No  . Sexual activity: Yes    Birth control/protection: Condom  Lifestyle  . Physical activity:    Days per week: Not on file    Minutes per session: Not on file  . Stress: Not on file  Relationships  . Social connections:    Talks on phone: Not on file    Gets together: Not on file    Attends religious service: Not on file    Active member of club or organization: Not on file    Attends meetings of clubs or organizations: Not on file    Relationship status: Not on file  . Intimate partner violence:    Fear of current or ex partner: Not on file    Emotionally abused: Not on file    Physically abused: Not on file    Forced sexual activity: Not on file  Other Topics Concern  . Not on file  Social History Narrative   Epworth Sleepiness Scale = 3 (as of 04/15/2015)    FAMILY HISTORY: Family History  Problem Relation Age of Onset  . Hypertension Mother   . Diabetes Mother   . Heart attack Mother   . Asthma Mother        Childhood  . Hypertension Sister   . Diabetes Sister     . Asthma Sister   . Diabetes Brother   . Hypertension Brother   . Diabetes Brother   . Colon cancer Neg Hx     ALLERGIES:  is allergic to sulfa antibiotics and sulfasalazine.  MEDICATIONS:  Current Outpatient Medications  Medication Sig Dispense Refill  . acetaminophen (TYLENOL) 500 MG tablet Take 500 mg by mouth as needed for pain.    [name] BENICAR HCT 40-25 MG per tablet Take 0.5 tablets  by mouth daily. Half tab daily    . benzonatate (TESSALON) 200 MG capsule Take 1 capsule (200 mg total) by mouth 3 (three) times daily as needed for cough. 20 capsule 0  . BLACK CURRANT SEED OIL PO Take by mouth daily. Pt takes 1-2 tsp liquid daily.    . diphenhydrAMINE (BENADRYL) 12.5 MG/5ML liquid Take by mouth as needed. Pt takes 1-2 tsp twice daily.    . Dulaglutide (TRULICITY) 8.32 PQ/9.8YM SOPN Inject into the skin.    . fexofenadine (ALLEGRA) 180 MG tablet Take 180 mg by mouth daily.    [name] HYDROcodone-homatropine (HYCODAN) 5-1.5 MG/5ML syrup Take 5 mLs by mouth every 6 (six) hours as needed for cough. 90 mL 0  . Multiple Vitamins-Minerals (MULTIVITAMIN ADULT) TABS Take 1 tablet by mouth daily.    . NONFORMULARY OR COMPOUNDED ITEM [facility]:  Combination Pain Cream + Ketamine - Baclofen 2%, Doxepin 5%, Gabapentin 6%, Topiramate 2%, Pentoxifylline 3%, Ketamine 10 %, apply 1-2 grams to affected area 3-4 times daily. 120 each 2  . Olopatadine HCl 0.2 % SOLN Apply 1 drop to eye daily. 2.5 mL 0  . ONE TOUCH ULTRA TEST test strip     . traMADol (ULTRAM) 50 MG tablet as needed.    . Turmeric POWD by Does not apply route. Pt takes 1-2 times weekly.     No current facility-administered medications for this visit.     REVIEW OF SYSTEMS:    A 10+ POINT REVIEW OF SYSTEMS WAS OBTAINED including neurology, dermatology, psychiatry, cardiac, respiratory, lymph, extremities, GI, GU, Musculoskeletal, constitutional, breasts, reproductive, HEENT.  All pertinent positives are noted in the HPI.  All others  are negative.   PHYSICAL EXAMINATION:  . Vitals:   04/02/18 1358  BP: (!) 143/79  Pulse: 74  Resp: 18  Temp: 98.3 F (36.8 C)  SpO2: 100%   Filed Weights   04/02/18 1358  Weight: 159 lb 4.8 oz (72.3 kg)   .Body mass index is 29.14 kg/m.  GENERAL:alert, in no acute distress and comfortable SKIN: no acute rashes, no significant lesions EYES: conjunctiva are pink and non-injected, sclera anicteric OROPHARYNX: MMM, no exudates, no oropharyngeal erythema or ulceration NECK: supple, no JVD LYMPH:  no palpable lymphadenopathy in the cervical, axillary or inguinal regions LUNGS: clear to auscultation b/l with normal respiratory effort HEART: regular rate & rhythm ABDOMEN:  normoactive bowel sounds , non tender, not distended. No palpable hepatosplenomegaly.  Extremity: no pedal edema PSYCH: alert & oriented x 3 with fluent speech NEURO: no focal motor/sensory deficits   LABORATORY DATA:  I have reviewed the data as listed  . CBC Latest Ref Rng & Units 04/02/2018 11/27/2017 11/27/2017  WBC 4.0 - 10.5 K/uL 2.3(L) 2.8(L) -  Hemoglobin 12.0 - 15.0 g/dL 12.3 12.8 -  Hematocrit 36.0 - 46.0 % 36.7 38.1 38.7  Platelets 150 - 400 K/uL 104(L) 105(L) -   ANC 1.6k . CBC    Component Value Date/Time   WBC 2.3 (L) 04/02/2018 1337   RBC 4.09 04/02/2018 1337   HGB 12.3 04/02/2018 1337   HCT 36.7 04/02/2018 1337   HCT 38.7 11/27/2017 1418   PLT 104 (L) 04/02/2018 1337   MCV 89.7 04/02/2018 1337   MCH 30.1 04/02/2018 1337   MCHC 33.5 04/02/2018 1337   RDW 12.4 04/02/2018 1337   LYMPHSABS 0.9 04/02/2018 1337   MONOABS 0.3 04/02/2018 1337   EOSABS 0.1 04/02/2018 1337   BASOSABS 0.0 04/02/2018 1337    . CMP  Latest Ref Rng & Units 04/02/2018 11/27/2017 03/08/2016  Glucose 70 - 99 mg/dL 168(H) 116(H) -  BUN 6 - 20 mg/dL 9 7 -  Creatinine 0.44 - 1.00 mg/dL 0.94 0.81 0.70  Sodium 135 - 145 mmol/L 138 141 -  Potassium 3.5 - 5.1 mmol/L 4.1 4.0 -  Chloride 98 - 111 mmol/L 104 104 -  CO2  22 - 32 mmol/L 28 29 -  Calcium 8.9 - 10.3 mg/dL 9.8 10.0 -  Total Protein 6.5 - 8.1 g/dL 8.3(H) 8.5(H) -  Total Bilirubin 0.3 - 1.2 mg/dL 0.9 1.0 -  Alkaline Phos 38 - 126 U/L 107 122 -  AST 15 - 41 U/L 74(H) 162(H) -  ALT 0 - 44 U/L 71(H) 131(H) -   10/06/17 CBC w/diff:     RADIOGRAPHIC STUDIES: I have personally reviewed the radiological images as listed and agreed with the findings in the report. No results found. 04/18/17 CT Cirrhosis Abdomen     ASSESSMENT & PLAN:  58 y.o. female with  1. Leukopenia - lymphopenia . Not neutropenic Labs upon initial presentation 10/06/17, HGB normal, WBC at 2.5k, PLT at 109k. Ferritin at 508   2. Thrombocytopenia 04/18/17 CT Cirrhosis Abdomen which revealed perisplenic varices and a splenorenal shunt with concern for portal hypertension.  PLAN -Discussed pt labwork today, 04/02/18; WBC slightly decreased to 2.3k, PLT stable at 104k, ANC slightly lower at 1.1k -Discussed the 12/11/17 NM Liver Spleen scan which revealed Normal colloid distribution between the liver, spleen and bone marrow, without evidence of colloid shift or hypersplenism. Questionable space-occupying lesion versus inhomogeneous uptake of tracer in the lateral segment LEFT lobe liver, unable to exclude mass; followup MR imaging with and without contrast recommended to exclude hepatic mass lesion. -Discussed the recommendation to characterize the patient's inhomogeneous liver uptake further with the repeat CT imaging, which I will order. Will monitor lymph nodes as well on repeat CT.  -Follow up with GI -Discussed either continuing watchful observation vs obtaining a BM Bx now. The pt will consider this further in the interim.  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 [name], as she has stopped taking Benicar and Trulicity  -Will see the pt back in 2 weeks    -CT abd in 1 week -RTC with Dr Irene Limbo in 2 weeks with labs   All of the patients questions were answered with apparent satisfaction. The patient knows to call the clinic with any problems, questions or concerns.  The total time spent in the appt was 25 minutes and more than 50% was on counseling and direct patient cares.    Sullivan Lone MD MS AAHIVMS [facility] Hematology/Oncology Physician [facility]  (Office):       [phone] (Work cell):  [phone] (Fax):           [phone]  04/02/2018 2:28 PM  I, Baldwin Jamaica, am acting as a scribe for Dr. Sullivan Lone.   .I have reviewed the above documentation for accuracy and completeness, and I agree with the above. Brunetta Genera MD

## 2018-04-02 ENCOUNTER — Telehealth: Payer: Self-pay

## 2018-04-02 ENCOUNTER — Encounter: Payer: Self-pay | Admitting: *Deleted

## 2018-04-02 ENCOUNTER — Inpatient Hospital Stay: Payer: Federal, State, Local not specified - PPO | Admitting: Hematology

## 2018-04-02 ENCOUNTER — Inpatient Hospital Stay: Payer: Federal, State, Local not specified - PPO | Attending: Hematology

## 2018-04-02 VITALS — BP 143/79 | HR 74 | Temp 98.3°F | Resp 18 | Ht 62.0 in | Wt 159.3 lb

## 2018-04-02 DIAGNOSIS — E669 Obesity, unspecified: Secondary | ICD-10-CM | POA: Diagnosis not present

## 2018-04-02 DIAGNOSIS — D72819 Decreased white blood cell count, unspecified: Secondary | ICD-10-CM

## 2018-04-02 DIAGNOSIS — I10 Essential (primary) hypertension: Secondary | ICD-10-CM | POA: Diagnosis not present

## 2018-04-02 DIAGNOSIS — D731 Hypersplenism: Secondary | ICD-10-CM

## 2018-04-02 DIAGNOSIS — D696 Thrombocytopenia, unspecified: Secondary | ICD-10-CM | POA: Diagnosis not present

## 2018-04-02 DIAGNOSIS — E119 Type 2 diabetes mellitus without complications: Secondary | ICD-10-CM | POA: Insufficient documentation

## 2018-04-02 DIAGNOSIS — Z79899 Other long term (current) drug therapy: Secondary | ICD-10-CM | POA: Diagnosis not present

## 2018-04-02 DIAGNOSIS — K769 Liver disease, unspecified: Secondary | ICD-10-CM

## 2018-04-02 DIAGNOSIS — K7460 Unspecified cirrhosis of liver: Secondary | ICD-10-CM

## 2018-04-02 LAB — CBC WITH DIFFERENTIAL/PLATELET
Abs Immature Granulocytes: 0 10*3/uL (ref 0.00–0.07)
Basophils Absolute: 0 10*3/uL (ref 0.0–0.1)
Basophils Relative: 0 %
Eosinophils Absolute: 0.1 10*3/uL (ref 0.0–0.5)
Eosinophils Relative: 3 %
HCT: 36.7 % (ref 36.0–46.0)
Hemoglobin: 12.3 g/dL (ref 12.0–15.0)
Immature Granulocytes: 0 %
Lymphocytes Relative: 39 %
Lymphs Abs: 0.9 10*3/uL (ref 0.7–4.0)
MCH: 30.1 pg (ref 26.0–34.0)
MCHC: 33.5 g/dL (ref 30.0–36.0)
MCV: 89.7 fL (ref 80.0–100.0)
Monocytes Absolute: 0.3 10*3/uL (ref 0.1–1.0)
Monocytes Relative: 11 %
Neutro Abs: 1.1 10*3/uL — ABNORMAL LOW (ref 1.7–7.7)
Neutrophils Relative %: 47 %
Platelets: 104 10*3/uL — ABNORMAL LOW (ref 150–400)
RBC: 4.09 MIL/uL (ref 3.87–5.11)
RDW: 12.4 % (ref 11.5–15.5)
WBC: 2.3 10*3/uL — ABNORMAL LOW (ref 4.0–10.5)
nRBC: 0 % (ref 0.0–0.2)

## 2018-04-02 LAB — CMP (CANCER CENTER ONLY)
ALT: 71 U/L — ABNORMAL HIGH (ref 0–44)
AST: 74 U/L — ABNORMAL HIGH (ref 15–41)
Albumin: 4 g/dL (ref 3.5–5.0)
Alkaline Phosphatase: 107 U/L (ref 38–126)
Anion gap: 6 (ref 5–15)
BUN: 9 mg/dL (ref 6–20)
CO2: 28 mmol/L (ref 22–32)
Calcium: 9.8 mg/dL (ref 8.9–10.3)
Chloride: 104 mmol/L (ref 98–111)
Creatinine: 0.94 mg/dL (ref 0.44–1.00)
GFR, Est AFR Am: >60 mL/min (ref >60)
GFR, Estimated: >60 mL/min (ref >60)
Glucose, Bld: 168 mg/dL — ABNORMAL HIGH (ref 70–99)
Potassium: 4.1 mmol/L (ref 3.5–5.1)
Sodium: 138 mmol/L (ref 135–145)
Total Bilirubin: 0.9 mg/dL (ref 0.3–1.2)
Total Protein: 8.3 g/dL — ABNORMAL HIGH (ref 6.5–8.1)

## 2018-04-02 NOTE — Telephone Encounter (Signed)
Printed avs and calender of upcoming appointment. Per 1/27 los. Gave patient contrast, instructions, and ct scheduling  information

## 2018-04-02 NOTE — Patient Instructions (Addendum)
Follow up with Liver Specialist and obtain an AFP tumor mark blood test and Liver US Continue follow up with PCP  Blood counts haven't changed dramatically in 4 months, and we could continue to monitor this over time, OR we could order a bone marrow biopsy now. Think about this until we meet next time.

## 2018-04-09 ENCOUNTER — Encounter

## 2018-04-09 ENCOUNTER — Encounter: Payer: Self-pay | Admitting: Neurology

## 2018-04-09 ENCOUNTER — Ambulatory Visit: Payer: Federal, State, Local not specified - PPO | Admitting: Neurology

## 2018-04-09 DIAGNOSIS — G43119 Migraine with aura, intractable, without status migrainosus: Secondary | ICD-10-CM | POA: Insufficient documentation

## 2018-04-09 HISTORY — DX: Migraine with aura, intractable, without status migrainosus: G43.119

## 2018-04-09 MED ORDER — TOPIRAMATE 25 MG PO TABS: ORAL_TABLET | ORAL | 3 refills | Status: DC

## 2018-04-09 MED ORDER — SUMATRIPTAN SUCCINATE 100 MG PO TABS: 100.0000 mg | ORAL_TABLET | Freq: Two times a day (BID) | ORAL | 3 refills | Status: DC | PRN

## 2018-04-09 NOTE — Progress Notes (Addendum)
Reason for visit: Headache  Referring physician: Dr. Venia Carbon Sperl is a 58 y.o. female  History of present illness:  Vicki [name] is a 58 year old left-handed black female with a history of headaches that have become more frequent since 2018.  The patient has had headaches off and on since she was around 58 years of age.  The patient reports that the headaches initially started around the right temporal region but have now been more bifrontal and retro-orbital in nature.  The patient has occasionally had scintillating scotoma in the left visual field with several of the headaches that may last about 10 minutes and then disappear with onset of the headache.  The headache is a dull pain, oftentimes this is not associated with photophobia or phonophobia.  The patient occasionally have some nausea.  She reports no numbness or tingling sensations on the arms or legs, she does have some difficulty with urinary urgency and incontinence, no problems controlling the bowels.  The patient has diabetes, she also has a fatty liver with cirrhosis of the liver.  She has been taking Tylenol for headaches, but she is trying to cut back on this because of the liver problems.  The patient denies a family history of headaches, she denies any particular activating factors for her headache.  She is sent to this office for an evaluation.  She denies any significant neck pain, she does have some crepitus in the neck at times.  Past Medical History:  Diagnosis Date  . Allergy   . Boil   . Diabetes ([place])    type 2  . Elevated liver enzymes   . Fatty liver   . Hyperlipemia   . Hypertension   . 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 PVC's (premature ventricular contractions)     Past Surgical History:  Procedure Laterality Date  . COLONOSCOPY  2014  . ECTOPIC PREGNANCY SURGERY  1990  . TONSILLECTOMY  1980  . UPPER GI ENDOSCOPY  08/2017   [facility]    Family History    Problem Relation Age of Onset  . Hypertension Mother   . Diabetes Mother   . Heart attack Mother   . Asthma Mother        Childhood  . Hypertension Sister   . Diabetes Sister   . Asthma Sister   . Diabetes Brother   . Hypertension Brother   . Diabetes Brother   . Colon cancer Neg Hx     Social history:  reports that she has never smoked. She has never used smokeless tobacco. She reports current alcohol use of about 3.0 standard drinks of alcohol per week. She reports that she does not use drugs.  Medications:  Prior to Admission medications   Medication Sig Start Date End Date Taking? Authorizing Provider  acetaminophen (TYLENOL) 500 MG tablet Take 500 mg by mouth as needed for pain.   Yes [provider]  alum & mag hydroxide-simeth (MAALOX PLUS) 400-400-40 MG/5ML suspension Take by mouth every 6 (six) hours as needed for indigestion.   Yes [provider]  BENICAR HCT 40-25 MG per tablet Take 0.5 tablets by mouth daily. Half tab daily 09/02/12  Yes [provider]  BLACK CURRANT SEED OIL PO Take by mouth daily. Pt takes 1-2 tsp liquid daily.   Yes [provider]  Dulaglutide (TRULICITY) 3.79 KW/4.0XB SOPN Inject into the skin.   Yes [provider]  Turmeric POWD by  Does not apply route. Pt takes 1-2 times weekly.   Yes [provider]  NONFORMULARY OR COMPOUNDED [place]:  Combination Pain Cream + Ketamine - Baclofen 2%, Doxepin 5%, Gabapentin 6%, Topiramate 2%, Pentoxifylline 3%, Ketamine 10 %, apply 1-2 grams to affected area 3-4 times daily. 08/29/17   Landis Martins, DPM      Allergies  Allergen Reactions  . Sulfa Antibiotics Itching  . Sulfasalazine Itching    ROS:  Out of a complete 14 system review of symptoms, the patient complains only of the following symptoms, and all other reviewed systems are negative.  Fatigue, chills Palpitations of the heart Ringing in the ears Rash, birthmarks,  moles Blurred vision, eye pain Constipation Easy bruising Joint pain, muscle cramps, aching muscles Allergies, frequent infections Headache, numbness Decreased energy Insomnia, sleepiness, shift work  Blood pressure 119/78, pulse 83, height 5\' 2"  (1.575 m), weight 161 lb (73 kg), last menstrual period 04/30/2012.  Physical Exam  General: The patient is alert and cooperative at the time of the examination.  Eyes: Pupils are equal, round, and reactive to light. Discs are flat bilaterally.  Neck: The neck is supple, no carotid bruits are noted.  Respiratory: The respiratory examination is clear.  Cardiovascular: The cardiovascular examination reveals a regular rate and rhythm, no obvious murmurs or rubs are noted.  Skin: Extremities are without significant edema.  Neurologic Exam  Mental status: The patient is alert and oriented x 3 at the time of the examination. The patient has apparent normal recent and remote memory, with an apparently normal attention span and concentration ability.  Cranial nerves: Facial symmetry is present. There is good sensation of the face to pinprick and soft touch bilaterally. The strength of the facial muscles and the muscles to head turning and shoulder shrug are normal bilaterally. Speech is well enunciated, no aphasia or dysarthria is noted. Extraocular movements are full. Visual fields are full. The tongue is midline, and the patient has symmetric elevation of the soft palate. No obvious hearing deficits are noted.  Motor: The motor testing reveals 5 over 5 strength of all 4 extremities. Good symmetric motor tone is noted throughout.  Sensory: Sensory testing is intact to pinprick, soft touch, vibration sensation, and position sense on all 4 extremities. No evidence of extinction is noted.  Coordination: Cerebellar testing reveals good finger-nose-finger and heel-to-shin bilaterally.  Gait and station: Gait is normal. Tandem gait is normal.  Romberg is negative. No drift is seen.  Reflexes: Deep tendon reflexes are symmetric and normal bilaterally. Toes are downgoing bilaterally.   Assessment/Plan:  1.  Intractable migraine headache  The patient has had frequent headaches, she may have 20 headache days a month.  She works third shift, sometimes has difficulty with sleeping.  The patient will be given a trial on Topamax, a prescription for Imitrex will be given to use if needed.  The patient will follow-up in about 3 months.  She will call for any dose adjustments of the medication.  She has had MRI evaluation of the brain done in 2018, the disc was brought for my review. MRI shows some cortical atrophy that is mild, no white matter changes are seen. The study was done 05/13/16.  Jill Alexanders MD 04/09/2018 3:49 PM  [facility] [address] [place], [place] [address]  Phone [phone] Fax [phone]

## 2018-04-09 NOTE — Patient Instructions (Signed)
We will start Topamax daily for the headache. Use Imitrex if needed for the headache.   Topamax (topiramate) is a seizure medication that has an FDA approval for seizures and for migraine headache. Potential side effects of this medication include weight loss, cognitive slowing, tingling in the fingers and toes, and carbonated drinks will taste bad. If any significant side effects are noted on this drug, please contact our office.

## 2018-04-10 ENCOUNTER — Encounter (HOSPITAL_COMMUNITY): Payer: Self-pay

## 2018-04-10 ENCOUNTER — Ambulatory Visit (HOSPITAL_COMMUNITY)
Admission: RE | Admit: 2018-04-10 | Discharge: 2018-04-10 | Disposition: A | Discharge status: Home / Self Care | Payer: Federal, State, Local not specified - PPO | Source: Ambulatory Visit | Attending: Hematology | Admitting: Hematology

## 2018-04-10 DIAGNOSIS — K769 Liver disease, unspecified: Secondary | ICD-10-CM | POA: Insufficient documentation

## 2018-04-10 DIAGNOSIS — R1013 Epigastric pain: Secondary | ICD-10-CM | POA: Diagnosis not present

## 2018-04-10 DIAGNOSIS — K7460 Unspecified cirrhosis of liver: Secondary | ICD-10-CM | POA: Insufficient documentation

## 2018-04-10 MED ORDER — IOHEXOL 300 MG/ML  SOLN
100.0000 mL | Freq: Once | INTRAMUSCULAR | Status: AC | PRN
  Administered 2018-04-10: 100 mL via INTRAVENOUS

## 2018-04-10 MED ORDER — SODIUM CHLORIDE (PF) 0.9 % IJ SOLN
INTRAMUSCULAR | Status: AC
  Filled 2018-04-10: qty 50

## 2018-04-16 NOTE — Progress Notes (Signed)
HEMATOLOGY/ONCOLOGY CLINIC NOTE  Date of Service: 04/17/2018  Patient Care Team: Vicki Morning, DO as PCP - General (Family Medicine) Vicki Gala, MD as Consulting Physician (Otolaryngology)  Dr. Doristine [name] in GI at [facility]:  Leukopenia   HISTORY OF PRESENTING ILLNESS:  Vicki [name] is a wonderful 58 y.o. female who has been referred to [place] by [name]  for evaluation and management of Leukopenia.. The pt reports that she is doing well overall.   The pt reports that she has lost six pounds over the last 4 months and endorses that she has modestly tried to lose weight. The pt also notes that she has sinus headaches. She had pneumonia on 10/28/17 as well.   She denies any problems bleeding.   The pt sees [name] for management of her liver cirrhosis.  She began Trulicity in the last year after taking metformin for many years. She was stopped on metformin with concerns for her liver disease. She notes that she had Vitamin B12 deficiency, had it replaced, and then saw that the B12 was too high.   The pt notes that she was told that she did not have Sarcoidosis when she saw [name]. .   The pt notes that she has been taking vinegar instead of her HCTZ for her blood pressure management.   Most recent lab results (10/06/17) of CBC w/diff  is as follows: all values are WNL except for WBC at 2.5k, PLT at 109k.  On review of systems, pt reports stable energy levels, mild intentional weight loss, sinus headaches, recent pneumonia, and denies nose bleeds, gum bleeds, concerns for bleeding, frequent infections, fevers, chills, night sweats, unexpected weight loss, abdominal pains, blood in the stools, and any other symptoms.   On PMHx the pt reports DM, HTN, Cirrhosis. On Social Hx the pt reports having a shot of vodka twice a month  Interval History:   [name] returns today for management and evaluation of her  leukopenia. The patient's last visit with [place] was on 04/02/18. The pt reports that she is doing well overall.   The pt reports that her previous boil on her lip has resolved in the interim. She notes that she has a new boil on her buttocks. She denies concern for infections at this time. She endorses stable energy levels and is eating well. She continues to feel like her baseline self, and denies developing any other concerns.   Of note since the patient's last visit, pt has had a CT A/P completed on 04/11/18 with results revealing No focal hepatic lesion. 2. Prominence of caudate lobe and venous collateralization suggests early cirrhosis and portal hypertension.  Lab results today (04/17/18) AFP Tumor marker is 3.3.  On review of systems, pt reports stable energy levels, and denies concerns for infections, abdominal pains, lower abdominal pains, leg swelling, and any other symptoms.   MEDICAL HISTORY:  Past Medical History:  Diagnosis Date  . Allergy   . Boil   . 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 Diabetes ([name])    type 2  . Elevated liver enzymes   . Fatty liver   . Hyperlipemia   . Hypertension   . 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 PVC's (premature ventricular contractions)     SURGICAL HISTORY: Past Surgical History:  Procedure Laterality Date  . COLONOSCOPY  2014  . ECTOPIC PREGNANCY SURGERY  1990  .  TONSILLECTOMY  1980  . UPPER GI ENDOSCOPY  08/2017   [name]    SOCIAL HISTORY: Social History   Socioeconomic History  . Marital status: Single    Spouse name: Not on file  . Number of children: 1  . Years of education: Not on file  . Highest education level: Some college, no degree  Occupational History    Employer: [name]  Social Needs  . Financial resource strain: Not on file  . Food insecurity:    Worry: Not on file    Inability: Not on file  . Transportation needs:    Medical: Not on file    Non-medical: Not  on file  Tobacco Use  . Smoking status: Never Smoker  . Smokeless tobacco: Never Used  Substance and Sexual Activity  . Alcohol use: Yes    Alcohol/week: 3.0 standard drinks    Types: 3 Standard drinks or equivalent per week    Comment: occasional  . Drug use: No  . Sexual activity: Yes    Birth control/protection: Condom  Lifestyle  . Physical activity:    Days per week: Not on file    Minutes per session: Not on file  . Stress: Not on file  Relationships  . Social connections:    Talks on phone: Not on file    Gets together: Not on file    Attends religious service: Not on file    Active member of club or organization: Not on file    Attends meetings of clubs or organizations: Not on file    Relationship status: Not on file  . Intimate partner violence:    Fear of current or ex partner: Not on file    Emotionally abused: Not on file    Physically abused: Not on file    Forced sexual activity: Not on file  Other Topics Concern  . Not on file  Social History Narrative   Epworth Sleepiness Scale = 3 (as of 04/15/2015)   Caffeine 1 cup/month   Lives at home with spouse    Left handed     FAMILY HISTORY: Family History  Problem Relation Age of Onset  . Hypertension Mother   . Diabetes Mother   . Heart attack Mother   . Asthma Mother        Childhood  . Hypertension Sister   . Diabetes Sister   . Asthma Sister   . Diabetes Brother   . Hypertension Brother   . Diabetes Brother   . Colon cancer Neg Hx     ALLERGIES:  is allergic to sulfa antibiotics and sulfasalazine.  MEDICATIONS:  Current Outpatient Medications  Medication Sig Dispense Refill  . acetaminophen (TYLENOL) 500 MG tablet Take 500 mg by mouth as needed for pain.    [name] alum & mag hydroxide-simeth (MAALOX PLUS) 400-400-40 MG/5ML suspension Take by mouth every 6 (six) hours as needed for indigestion.    [name] BENICAR HCT 40-25 MG per tablet Take 0.5 tablets by mouth daily. Half tab daily    . BLACK CURRANT  SEED OIL PO Take by mouth daily. Pt takes 1-2 tsp liquid daily.    . Dulaglutide (TRULICITY) 5.64 PP/2.9JJ SOPN Inject into the skin.    . NONFORMULARY OR COMPOUNDED ITEM [name]:  Combination Pain Cream + Ketamine - Baclofen 2%, Doxepin 5%, Gabapentin 6%, Topiramate 2%, Pentoxifylline 3%, Ketamine 10 %, apply 1-2 grams to affected area 3-4 times daily. 120 each 2  . SUMAtriptan (IMITREX) 100 MG  tablet Take 1 tablet (100 mg total) by mouth 2 (two) times daily as needed for migraine. May repeat in 2 hours if headache persists or recurs. 10 tablet 3  . topiramate (TOPAMAX) 25 MG tablet Take one tablet at night for one week, then take 2 tablets at night for one week, then take 3 tablets at night. 90 tablet 3  . Turmeric POWD by Does not apply route. Pt takes 1-2 times weekly.     No current facility-administered medications for this visit.     REVIEW OF SYSTEMS:    A 10+ POINT REVIEW OF SYSTEMS WAS OBTAINED including neurology, dermatology, psychiatry, cardiac, respiratory, lymph, extremities, GI, GU, Musculoskeletal, constitutional, breasts, reproductive, HEENT.  All pertinent positives are noted in the HPI.  All others are negative.   PHYSICAL EXAMINATION:  . Vitals:   04/17/18 1506  BP: 139/81  Pulse: 87  Resp: 18  Temp: 98.6 F (37 C)  SpO2: 100%   Filed Weights   04/17/18 1506  Weight: 161 lb 3.2 oz (73.1 kg)   .Body mass index is 29.48 kg/m.  GENERAL:alert, in no acute distress and comfortable SKIN: no acute rashes, no significant lesions EYES: conjunctiva are pink and non-injected, sclera anicteric OROPHARYNX: MMM, no exudates, no oropharyngeal erythema or ulceration NECK: supple, no JVD LYMPH:  no palpable lymphadenopathy in the cervical, axillary or inguinal regions LUNGS: clear to auscultation b/l with normal respiratory effort HEART: regular rate & rhythm ABDOMEN:  normoactive bowel sounds , non tender, not distended. No palpable hepatosplenomegaly.    Extremity: no pedal edema PSYCH: alert & oriented x 3 with fluent speech NEURO: no focal motor/sensory deficits   LABORATORY DATA:  I have reviewed the data as listed  . CBC Latest Ref Rng & Units 04/02/2018 11/27/2017 11/27/2017  WBC 4.0 - 10.5 K/uL 2.3(L) 2.8(L) -  Hemoglobin 12.0 - 15.0 g/dL 12.3 12.8 -  Hematocrit 36.0 - 46.0 % 36.7 38.1 38.7  Platelets 150 - 400 K/uL 104(L) 105(L) -   ANC 1.6k . CBC    Component Value Date/Time   WBC 2.3 (L) 04/02/2018 1337   RBC 4.09 04/02/2018 1337   HGB 12.3 04/02/2018 1337   HCT 36.7 04/02/2018 1337   HCT 38.7 11/27/2017 1418   PLT 104 (L) 04/02/2018 1337   MCV 89.7 04/02/2018 1337   MCH 30.1 04/02/2018 1337   MCHC 33.5 04/02/2018 1337   RDW 12.4 04/02/2018 1337   LYMPHSABS 0.9 04/02/2018 1337   MONOABS 0.3 04/02/2018 1337   EOSABS 0.1 04/02/2018 1337   BASOSABS 0.0 04/02/2018 1337    . CMP Latest Ref Rng & Units 04/02/2018 11/27/2017 03/08/2016  Glucose 70 - 99 mg/dL 168(H) 116(H) -  BUN 6 - 20 mg/dL 9 7 -  Creatinine 0.44 - 1.00 mg/dL 0.94 0.81 0.70  Sodium 135 - 145 mmol/L 138 141 -  Potassium 3.5 - 5.1 mmol/L 4.1 4.0 -  Chloride 98 - 111 mmol/L 104 104 -  CO2 22 - 32 mmol/L 28 29 -  Calcium 8.9 - 10.3 mg/dL 9.8 10.0 -  Total Protein 6.5 - 8.1 g/dL 8.3(H) 8.5(H) -  Total Bilirubin 0.3 - 1.2 mg/dL 0.9 1.0 -  Alkaline Phos 38 - 126 U/L 107 122 -  AST 15 - 41 U/L 74(H) 162(H) -  ALT 0 - 44 U/L 71(H) 131(H) -   10/06/17 CBC w/diff:     RADIOGRAPHIC STUDIES: I have personally reviewed the radiological images as listed and agreed with the findings in  the report. Ct Abdomen W Contrast  Result Date: 04/11/2018 CLINICAL DATA:  Per patient "some epigastric discomfort" History of Leukopenia, and Cirrhosis of liver being followed at [name] Abnormal NM study of liver Neoplasm: liver/biliary, suspected Patient with liver cirrhosis with an abnormal nuclear medicine scan suggest left lobe of liver abnormality. Also for f/u of prior  lymphadenopathy EXAM: CT ABDOMEN WITH CONTRAST TECHNIQUE: Multidetector CT imaging of the abdomen was performed using the standard protocol following bolus administration of intravenous contrast. CONTRAST:  160m OMNIPAQUE IOHEXOL 300 MG/ML  SOLN COMPARISON:  Sulfur colloid liver scan 12/11/2017 FINDINGS: Lower chest:  Lung bases are clear Hepatobiliary: No focal lesion within the liver with particular attention directed to the LEFT hepatic lobe questionable lesion on comparison several colloid study. Normal gallbladder. No biliary duct dilatation. Caudate lobe is mildly enlarged. Portal veins are patent. Venous collateralization noted along the gastric fundus (image 18/2) suggesting portal hypertension. Pancreas: Normal pancreatic parenchymal intensity. No ductal dilatation or inflammation. Spleen: Normal spleen. Adrenals/urinary tract: Adrenal glands and kidneys are normal. Stomach/Bowel: Stomach and limited of the small bowel is unremarkable Vascular/Lymphatic: Abdominal aortic normal caliber. No retroperitoneal periportal lymphadenopathy. Musculoskeletal: No aggressive osseous lesion IMPRESSION: 1. No focal hepatic lesion. 2. Prominence of caudate lobe and venous collateralization suggests early cirrhosis and portal hypertension. Electronically Signed   By: SSuzy BouchardM.D.   On: 04/11/2018 08:49   04/18/17 CT Cirrhosis Abdomen     ASSESSMENT & PLAN:  58y.o. female with  1. Leukopenia - lymphopenia . Not neutropenic Labs upon initial presentation 10/06/17, HGB normal, WBC at 2.5k, PLT at 109k. Ferritin at 508   2. Thrombocytopenia 04/18/17 CT Cirrhosis Abdomen which revealed perisplenic varices and a splenorenal shunt with concern for portal hypertension.  12/11/17 NM Liver Spleen scan revealed Normal colloid distribution between the liver, spleen and bone marrow, without evidence of colloid shift or hypersplenism. Questionable space-occupying lesion versus inhomogeneous uptake of tracer in the  lateral segment LEFT lobe liver, unable to exclude mass; followup MR imaging with and without contrast recommended to exclude hepatic mass lesion.   PLAN -Discussed pt labwork today, 04/17/18 AFP is wnl -Discussed the 04/11/18 CT A/P which revealed No focal hepatic lesion. 2. Prominence of caudate lobe and venous collateralization suggests early cirrhosis and portal hypertension. -Continue follow up with [name] for liver care and management -Discussed either continuing watchful observation vs obtaining a BM Bx now. The pt will consider this further in the interim.  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 DJanie [name] as she has stopped taking Benicar and Trulicity  -Will see the pt back in 6 months    RTC with Dr KIrene Limbowith labs in 6 months   All of the patients questions were answered with apparent satisfaction. The patient knows to call the clinic with any problems, questions or concerns.  The total time spent in the appt was 20 minutes and more than 50% was on counseling and direct patient cares.    GSullivan LoneMD MHomecroftAAHIVMS SThe Center For Orthopedic Medicine LLCCSaint Camillus Medical CenterHematology/Oncology Physician CGre[facility] (Office):       [phone](Work cell):  [phone](Fax):           [phone] 04/17/2018 3:19 PM  I, SDanise Mina  Bain, am acting as a scribe for Dr. Sullivan Lone.   .I have reviewed the above documentation for accuracy and completeness, and I agree with the above. Brunetta Genera MD

## 2018-04-17 ENCOUNTER — Inpatient Hospital Stay: Payer: Federal, State, Local not specified - PPO

## 2018-04-17 ENCOUNTER — Encounter: Payer: Self-pay | Admitting: *Deleted

## 2018-04-17 ENCOUNTER — Ambulatory Visit: Payer: Federal, State, Local not specified - PPO | Admitting: Sports Medicine

## 2018-04-17 ENCOUNTER — Telehealth: Payer: Self-pay | Admitting: Hematology

## 2018-04-17 ENCOUNTER — Inpatient Hospital Stay: Payer: Federal, State, Local not specified - PPO | Attending: Hematology | Admitting: Hematology

## 2018-04-17 VITALS — BP 139/81 | HR 87 | Temp 98.6°F | Resp 18 | Ht 62.0 in | Wt 161.2 lb

## 2018-04-17 DIAGNOSIS — I10 Essential (primary) hypertension: Secondary | ICD-10-CM

## 2018-04-17 DIAGNOSIS — D72819 Decreased white blood cell count, unspecified: Secondary | ICD-10-CM | POA: Diagnosis not present

## 2018-04-17 DIAGNOSIS — E119 Type 2 diabetes mellitus without complications: Secondary | ICD-10-CM | POA: Diagnosis not present

## 2018-04-17 DIAGNOSIS — Z79899 Other long term (current) drug therapy: Secondary | ICD-10-CM | POA: Insufficient documentation

## 2018-04-17 DIAGNOSIS — D696 Thrombocytopenia, unspecified: Secondary | ICD-10-CM | POA: Diagnosis not present

## 2018-04-17 DIAGNOSIS — K769 Liver disease, unspecified: Secondary | ICD-10-CM

## 2018-04-17 DIAGNOSIS — K7460 Unspecified cirrhosis of liver: Secondary | ICD-10-CM

## 2018-04-17 NOTE — Telephone Encounter (Signed)
Gave patient avs report and appointments for August.  °

## 2018-04-18 ENCOUNTER — Telehealth: Payer: Self-pay | Admitting: *Deleted

## 2018-04-18 LAB — AFP TUMOR MARKER: AFP, Serum, Tumor Marker: 3.3 ng/mL (ref 0.0–8.3)

## 2018-04-18 NOTE — Telephone Encounter (Signed)
Contacted patient per Dr. Grier Mitts instructions to inform patient the CT did not show any enlarged lymph nodes and no liver mass.  Patient verbalized understanding and appreciation for information.

## 2018-04-25 DIAGNOSIS — K7460 Unspecified cirrhosis of liver: Secondary | ICD-10-CM | POA: Diagnosis not present

## 2018-04-25 DIAGNOSIS — K7581 Nonalcoholic steatohepatitis (NASH): Secondary | ICD-10-CM | POA: Diagnosis not present

## 2018-04-25 DIAGNOSIS — R5383 Other fatigue: Secondary | ICD-10-CM | POA: Diagnosis not present

## 2018-05-03 ENCOUNTER — Telehealth: Payer: Self-pay | Admitting: Cardiovascular Disease

## 2018-05-03 NOTE — Telephone Encounter (Signed)
STAT if HR is under 50 or over 120 (normal HR is 60-100 beats per minute)  1) What is your heart rate? She does not know the actual rate  2) Do you have a log of your heart rate readings (document readings)? no  3) Pt's heart  Rate is  high and also having palpitations-  Feel like she pulled a muscle up her left breast and chets pt wants to be seen-

## 2018-05-03 NOTE — Telephone Encounter (Signed)
Spoke with pt and pt  has noted racing heart when walking and chest discomfort  Per pt has not changed caffeine intake as of lately Pt is way over due for appt Pt would like to exercise but is scared with symptoms would like to be checked prior to starting exercise program. APPT made for next week .Will forward to Dr Gwenlyn Found for review as well .Adonis Housekeeper

## 2018-05-07 NOTE — Telephone Encounter (Signed)
Let's see what our APP says

## 2018-05-08 ENCOUNTER — Ambulatory Visit: Payer: Federal, State, Local not specified - PPO | Admitting: Sports Medicine

## 2018-05-08 ENCOUNTER — Other Ambulatory Visit: Payer: Self-pay | Admitting: Sports Medicine

## 2018-05-08 ENCOUNTER — Ambulatory Visit (INDEPENDENT_AMBULATORY_CARE_PROVIDER_SITE_OTHER): Payer: Federal, State, Local not specified - PPO

## 2018-05-08 ENCOUNTER — Encounter: Payer: Self-pay | Admitting: Nurse Practitioner

## 2018-05-08 ENCOUNTER — Encounter: Payer: Self-pay | Admitting: Sports Medicine

## 2018-05-08 DIAGNOSIS — M722 Plantar fascial fibromatosis: Secondary | ICD-10-CM

## 2018-05-08 DIAGNOSIS — B351 Tinea unguium: Secondary | ICD-10-CM

## 2018-05-08 DIAGNOSIS — M79671 Pain in right foot: Secondary | ICD-10-CM

## 2018-05-08 DIAGNOSIS — E119 Type 2 diabetes mellitus without complications: Secondary | ICD-10-CM

## 2018-05-08 DIAGNOSIS — M779 Enthesopathy, unspecified: Secondary | ICD-10-CM

## 2018-05-08 DIAGNOSIS — M25571 Pain in right ankle and joints of right foot: Secondary | ICD-10-CM

## 2018-05-08 DIAGNOSIS — M7741 Metatarsalgia, right foot: Secondary | ICD-10-CM

## 2018-05-08 NOTE — Progress Notes (Signed)
Subjective: Vicki [name] is a 58 y.o.Diabetic female patient returns to office follow up heel pain on the right>left and palpable lump in arch on right that is the less painful but could not wear her brace because it was too small.  Patient also reports that she is concerned about a new pain on the bottom of her right foot not sure if she has stepped on something.  Sometimes when she puts pressure to the ball of her foot.  Patient does admit that the pain in her right foot because the way that she is walking now using the ball more to avoid the heel and arch is hurting on the side of the right foot with pain shooting up into the ankle sometimes giving a episode of sharp pain that slowly decreases on its own after a period of rest for stopping her activity.  Patient also reports that she is here for question of fungal culture results from her left first and second toes. Denies any other pedal complaints.   Patient Active Problem List   Diagnosis Date Noted  . 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 Palpitations 04/15/2015  . Controlled type 2 diabetes mellitus without complication ([place]) 62/70/3500  . Elevated liver enzymes 03/31/2014  . Tinea pedis 07/31/2013  . Foot and toe(s), blister, infected 07/31/2013  . 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 Pain in joint, ankle and foot 08/31/2012    Current Outpatient Medications on File Prior to Visit  Medication Sig Dispense Refill  . acetaminophen (TYLENOL) 500 MG tablet Take 500 mg by mouth as needed for pain.    [name] alum & mag hydroxide-simeth (MAALOX PLUS) 400-400-40 MG/5ML suspension Take by mouth every 6 (six) hours as needed for indigestion.    [name] BENICAR HCT 40-25 MG per tablet Take 0.5 tablets by mouth daily. Half tab daily    . BLACK CURRANT SEED OIL PO Take by mouth daily. Pt takes 1-2 tsp liquid daily.    . Dulaglutide (TRULICITY) 9.38 HW/2.9HB SOPN Inject into the skin.    . fluorometholone (FML) 0.1 % ophthalmic  suspension Apply to eye.    . NONFORMULARY OR COMPOUNDED ITEM [facility]:  Combination Pain Cream + Ketamine - Baclofen 2%, Doxepin 5%, Gabapentin 6%, Topiramate 2%, Pentoxifylline 3%, Ketamine 10 %, apply 1-2 grams to affected area 3-4 times daily. 120 each 2  . SUMAtriptan (IMITREX) 100 MG tablet Take 1 tablet (100 mg total) by mouth 2 (two) times daily as needed for migraine. May repeat in 2 hours if headache persists or recurs. 10 tablet 3  . topiramate (TOPAMAX) 25 MG tablet Take one tablet at night for one week, then take 2 tablets at night for one week, then take 3 tablets at night. 90 tablet 3  . Turmeric POWD by Does not apply route. Pt takes 1-2 times weekly.     No current facility-administered medications on file prior to visit.     Allergies  Allergen Reactions  . Sulfa Antibiotics Itching  . Sulfasalazine Itching    Objective: Physical Exam General: The patient is alert and oriented x3 in no acute distress.  Dermatology: Skin is warm, dry and supple bilateral lower extremities. Nails 1-10 are normal however on left 1st and 5th toenails are discolored and thick. Focal to mid-arch there is a soft tissue mass in plantar fascia resembling fibroma on right. There is a macule noted to the bottom of the right foot and next to this area there is a small area  of pain with forefoot pressure however no opening no warmth no redness no drainage no signs of foreign body or entry wound noted to the right plantar forefoot, no erythema, edema, no eccymosis, no open lesions present. Integument is otherwise unremarkable.  Vascular: Dorsalis Pedis pulse and Posterior Tibial pulse are 2/4 bilateral. Capillary fill time is immediate to all digits.  Neurological: Grossly intact to light touch with an achilles reflex of +2/5 and a negative Tinel's sign bilateral.  Musculoskeletal: Mild tenderness to palpation at the medial calcaneal tubercale and through the insertion of the plantar fascia on  the right>Left foot especially at fibroma at right arch.  Pain subjectively that radiates occasionally to the lateral side of the right foot and ankle and to the ball of the right foot.  No pain with compression of calcaneus bilateral. No pain with tuning fork to calcaneus bilateral. No pain with calf compression bilateral. There is decreased Ankle joint range of motion bilateral. All other joints range of motion within normal limits bilateral. Strength 5/5 in all groups bilateral.   Assessment and Plan: Problem List Items Addressed This Visit      Endocrine   Controlled type 2 diabetes mellitus without complication (HCC)     Musculoskeletal and Integument   Plantar fascial fibromatosis    Other Visit Diagnoses    Right foot pain    -  Primary   Nail fungus       Acute right ankle pain       Tendonitis       Metatarsalgia of right foot         -Complete examination performed.  -Re-discussed with patient in detail the condition of plantar fasciitis with fibroma with now likely lateral compensation ankle pain and increased forefoot loading on the right that could be adding to the pain in her ball however I did advise patient that I do not see any type of foreign object or foreign body that could suggest that she has stepped on something -Patient continues to decline injection due to PVC history for plantar fasciitis -Patient continues to decline oral medication due to PVC history for tendinitis and metatarsalgia -Dispensed offloading padding to use to the ball of her foot and dispensed a replacement ankle gauntlet to use for arch and ankle pain on the right since her previous ankle gauntlet did not work because it was too small in size -Continue with topical pain cream to use at right foot/arch  -Recommended good supportive shoes and insoles -Recommend cont to ice affected area 1-2x daily. -Discussed with patient fungal culture results which is positive for fungus at left first and fifth  toenail -Recommend patient to use topical compound for fungus to nails and advised patient that it may take a year to get better advised patient that if it seems like after 3 months this is slowly improving would recommend adding on laser treatment -Patient to return to office as needed or sooner if problems or questions arise.  Landis Martins, DPM

## 2018-05-09 ENCOUNTER — Ambulatory Visit: Payer: Federal, State, Local not specified - PPO | Admitting: Nurse Practitioner

## 2018-05-09 ENCOUNTER — Ambulatory Visit (INDEPENDENT_AMBULATORY_CARE_PROVIDER_SITE_OTHER): Payer: Federal, State, Local not specified - PPO | Admitting: Nurse Practitioner

## 2018-05-09 ENCOUNTER — Telehealth: Payer: Self-pay | Admitting: *Deleted

## 2018-05-09 ENCOUNTER — Encounter: Payer: Self-pay | Admitting: Nurse Practitioner

## 2018-05-09 VITALS — BP 170/98 | HR 72 | Ht 62.0 in | Wt 163.8 lb

## 2018-05-09 DIAGNOSIS — R079 Chest pain, unspecified: Secondary | ICD-10-CM

## 2018-05-09 DIAGNOSIS — R002 Palpitations: Secondary | ICD-10-CM

## 2018-05-09 DIAGNOSIS — M79604 Pain in right leg: Secondary | ICD-10-CM

## 2018-05-09 DIAGNOSIS — M79605 Pain in left leg: Secondary | ICD-10-CM | POA: Diagnosis not present

## 2018-05-09 MED ORDER — NONFORMULARY OR COMPOUNDED ITEM: 5 refills | Status: DC

## 2018-05-09 NOTE — Progress Notes (Deleted)
CARDIOLOGY OFFICE NOTE  Date:  05/09/2018    [name] Date of Birth: 03-20-1960 Medical Record #[id number]  PCP:  [name], DO  Cardiologist:  Servando Snare & ***    No chief complaint on file.   History of Present Illness: Vicki [name] is a 58 y.o. 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 Maudie Mercury for cardiac evaluation because of new onset palpitations. I last saw her in the office 09/25/15. Factors include treated hypertension and diabetes. Her mother did have a myocardial infarction at age 31. She has never had a heart attack or stroke and denies chest pain or shortness of breath. She is in her perimenopause time window is followed by Dr. Dellis Filbert. She drinks 2 cups of caffeinated beverages a day as well as alcohol on the weekends. She is not under a lot of stress recently. A 2-D echocardiogram was normal and an event monitor showed sinus rhythm sinus/sinus tachycardia and frequent PVCs. She had decreased her caffeine intake which improved her symptoms a year ago. However recently, she's noticed increased heart rate and palpitations are somewhat different quality than her previous symptoms. She also was recently diagnosed with NASH.  Comes in today. Here with   Past Medical History:  Diagnosis Date  . Allergy   . Boil   . 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 Diabetes ([place])    type 2  . Elevated liver enzymes   . Fatty liver   . Hyperlipemia   . Hypertension   . 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 PVC's (premature ventricular contractions)     Past Surgical History:  Procedure Laterality Date  . COLONOSCOPY  2014  . ECTOPIC PREGNANCY SURGERY  1990  . TONSILLECTOMY  1980  . UPPER GI ENDOSCOPY  08/2017   [facility]     Medications: No outpatient medications have been marked as taking for the 05/09/18  encounter (Appointment) with Burtis Junes, NP.     Allergies: Allergies  Allergen Reactions  . Sulfa Antibiotics Itching  . Sulfasalazine Itching    Social History: The patient  reports that she has never smoked. She has never used smokeless tobacco. She reports current alcohol use of about 3.0 standard drinks of alcohol per week. She reports that she does not use drugs.   Family History: The patient's ***family history includes Asthma in her mother and sister; Diabetes in her brother, brother, mother, and sister; Heart attack in her mother; Hypertension in her brother, mother, and sister.   Review of Systems: Please see the history of present illness.   Otherwise, the review of systems is positive for {NONE DEFAULTED:18576::"none"}.   All other systems are reviewed and negative.   Physical Exam: VS:  LMP 04/30/2012  .  BMI There is no height or weight on file to calculate BMI.  Wt Readings from Last 3 Encounters:  04/17/18 161 lb 3.2 oz (73.1 kg)  04/09/18 161 lb (73 kg)  04/02/18 159 lb 4.8 oz (72.3 kg)    General: Pleasant. Well developed, well nourished and in no acute distress.   HEENT: Normal.  Neck: Supple, no JVD, carotid bruits, or masses noted.  Cardiac: ***Regular rate and rhythm. No murmurs, rubs, or gallops. No edema.  Respiratory:  Lungs are clear to auscultation bilaterally with normal work of breathing.  GI: Soft and  nontender.  MS: No deformity or atrophy. Gait and ROM intact.  Skin: Warm and dry. Color is normal.  Neuro:  Strength and sensation are intact and no gross focal deficits noted.  Psych: Alert, appropriate and with normal affect.   LABORATORY DATA:  EKG:  EKG {ACTION; IS/IS NWG:[id number]} ordered today. This demonstrates ***.  Lab Results  Component Value Date   WBC 2.3 (L) 04/02/2018   HGB 12.3 04/02/2018   HCT 36.7 04/02/2018   PLT 104 (L) 04/02/2018   GLUCOSE 168 (H) 04/02/2018   ALT 71 (H) 04/02/2018   AST 74 (H) 04/02/2018   NA  138 04/02/2018   K 4.1 04/02/2018   CL 104 04/02/2018   CREATININE 0.94 04/02/2018   BUN 9 04/02/2018   CO2 28 04/02/2018   TSH 1.11 04/15/2015   HGBA1C 7.4 (H) 03/31/2014       BNP (last 3 results) No results for input(s): BNP in the last 8760 hours.  ProBNP (last 3 results) No results for input(s): PROBNP in the last 8760 hours.   Other Studies Reviewed Today:  Monitor Report 01/2017 Notes recorded by Lorretta Harp, MD on 03/02/2017 at 7:33 AM EST 1. SR/ST  Assessment/Plan:  Palpitations History of palpitations with normal 2-D echocardiogram and event monitor showing frequent PVCs. At that time she was admitted to alcohol and caffeine intake which when modified improved her symptoms. Now she says she has palpitations which are somewhat different than her prior symptoms. I am going to get to event monitor to further evaluate.  Echo Study Conclusions 2017  - Left ventricle: The cavity size was normal. Systolic function was   normal. The estimated ejection fraction was in the range of 60%   to 65%. Wall motion was normal; there were no regional wall   motion abnormalities. Doppler parameters are consistent with   abnormal left ventricular relaxation (grade 1 diastolic   dysfunction). There was no evidence of elevated ventricular   filling pressure by Doppler parameters. - Aortic valve: Trileaflet; normal thickness leaflets. There was no   regurgitation. - Aortic root: The aortic root was normal in size. - Mitral valve: Structurally normal valve. There was no   regurgitation. - Left atrium: The atrium was mildly dilated. - Right ventricle: The cavity size was normal. Wall thickness was   normal. Systolic function was normal. - Right atrium: The atrium was normal in size. - Tricuspid valve: There was trivial regurgitation. - Pulmonic valve: There was no regurgitation. - Pulmonary arteries: Systolic pressure was within the normal   range. - Inferior vena cava:  The vessel was normal in size. - Pericardium, extracardiac: There was no pericardial effusion.     Current medicines are reviewed with the patient today.  The patient does not have concerns regarding medicines other than what has been noted above.  The following changes have been made:  See above.  Labs/ tests ordered today include:   No orders of the defined types were placed in this encounter.    Disposition:   FU with *** in {gen number [id number]} {Days to years:10300}.   Patient is agreeable to this plan and will call if any problems develop in the interim.   SignedTruitt Merle, NP  05/09/2018 7:27 AM  [facility] [address] [place], [place]  [address] Phone: [phone] Fax: [phone]

## 2018-05-09 NOTE — Telephone Encounter (Signed)
Faxed orders to [facility]. 

## 2018-05-09 NOTE — Telephone Encounter (Signed)
-----   Message from Landis Martins, [place] sent at 05/08/2018  4:07 PM EST ----- Regarding: [facility] Topical for nail fungus

## 2018-05-09 NOTE — Patient Instructions (Addendum)
We will be checking the following labs today - NONE   Medication Instructions:    Continue with your current medicines.    If you need a refill on your cardiac medications before your next appointment, please call your pharmacy.     Testing/Procedures To Be Arranged:  GXT - to see what your heart rhythm does when you exercise  Lower extremity arterial dopplers - this will check the blood flow to your legs  I want you to monitor your BP - bring your cuff with you to one of your next appointments to check it.   Follow-Up:   Let's see how your studies turn out - we will the decide about follow up.     At [facility], you and your health needs are our priority.  As part of our continuing mission to provide you with exceptional heart care, we have created designated Provider Care Teams.  These Care Teams include your primary Cardiologist (physician) and Advanced Practice Providers (APPs -  Physician Assistants and Nurse Practitioners) who all work together to provide you with the care you need, when you need it.  Special Instructions:   You are scheduled for an Exercise Stress Test on _______________________________ at ___________________________________________.   Please arrive 15 minutes prior to your appointment time for registration and insurance purposes.   The test will take approximately 45 minutes to complete.   How to prepare for your Exercise Stress Test:    Do bring a list of your current medications with you. If not listed below, you may take your medications as normal.    Bring any medication held to your appointment as you may be required to take it once the test is complete.   Do wear comfortable closes (no dresses or overalls) and walking shoes - tennis shoes are preferred. No open toed shoes or heels.  Do not wear cologne, perfume, aftershave or lotions (deodorant is allowed).  Please report to the [facility] for your test.   If  you have questions or concerns about your appointment, you can call the Exercise Stress Lab at [phone].   If you cannot keep your appointment, please provide 24 hours notification to the Exercise Stress Lab to avoid a possible $50 charge to your account.  .                  .   Call the [place] office at [phone] if you have any questions, problems or concerns.

## 2018-05-09 NOTE — Progress Notes (Signed)
CARDIOLOGY OFFICE NOTE  Date:  05/09/2018    Vicki [name] Date of Birth: 08-May-1960 Medical Record #[id number]  PCP:  Vicki Morning, DO  Cardiologist:  Vicki [name]    Chief Complaint  Patient presents with  . Chest Pain  . Palpitations    Work in visit - seen for Dr. Gwenlyn [name]    History of Present Illness: Vicki [name] is a 58 y.o. female who presents today for a work in visit. Seen for Dr. Gwenlyn [name].   She has a history of palpitations, obesity, HTN, DM and NASH. Her FH is + for CAD. She has had a prior normal echo as well as a reassuring event monitor.   Has not been seen since October of 2018.   Phone call last week - "Spoke with pt and pt  has noted racing heart when walking and chest discomfort  Per pt has not changed caffeine intake as of lately Pt is way over due for appt Pt would like to exercise but is scared with symptoms would like to be checked prior to starting exercise program. APPT made for next week .Will forward to Dr Vicki [name] for review as well ./cy"  Thus added to my schedule.   Comes in today. Here alone. She notes that for the past several weeks, she has had heart racing - this occurs with rushing, with walking etc. It resolves with rest. She has noted some pain across her chest over the past few weeks as well -  worse with turning, bending over, etc. Nothing exertional.  She has numb toes on the left and the left shin.  Her legs can hurt with walking.  She works 3rd shift at the [employer] - this is hard for her.  BP is high here today - says it is better at home but her cuff may not be accurate. She has not been monitoring recently. She is not taking her Benicar HCT - she says this made her feel weird - says she was just taking for kidney protection. BP is pretty high here today. She has declined what sounds like beta blocker in the past for her palpitations. Admits she is scared to exercise. Last A1C is 7.4  Past Medical History:  Diagnosis Date  . Allergy   . Boil     . 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 Diabetes ([place])    type 2  . Elevated liver enzymes   . Fatty liver   . Hyperlipemia   . Hypertension   . 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 PVC's (premature ventricular contractions)     Past Surgical History:  Procedure Laterality Date  . COLONOSCOPY  2014  . ECTOPIC PREGNANCY SURGERY  1990  . TONSILLECTOMY  1980  . UPPER GI ENDOSCOPY  08/2017   [facility]     Medications: Current Meds  Medication Sig  . acetaminophen (TYLENOL) 500 MG tablet Take 500 mg by mouth as needed for pain.  [name] alum & mag hydroxide-simeth (MAALOX PLUS) 400-400-40 MG/5ML suspension Take by mouth every 6 (six) hours as needed for indigestion.  [name] BENICAR HCT 40-25 MG per tablet Take 0.5 tablets by mouth daily. Half tab daily  . BLACK CURRANT SEED OIL PO Take by mouth daily. Pt takes 1-2 tsp liquid daily.  . Dulaglutide (TRULICITY) 1.19 ER/7.4YC SOPN Inject into the skin.  . fluorometholone (FML) 0.1 % ophthalmic suspension Apply to eye.  . NONFORMULARY OR COMPOUNDED ITEM  [facility]:  Combination Pain Cream + Ketamine - Baclofen 2%, Doxepin 5%, Gabapentin 6%, Topiramate 2%, Pentoxifylline 3%, Ketamine 10 %, apply 1-2 grams to affected area 3-4 times daily.  . NONFORMULARY OR COMPOUNDED ITEM [facility]:  Antifungal cream - Terbinafine 3%, Fluconazole 2%, Tea Tree Oil 5%, Urea 10%, Ibuprofen 2% in #28ml suspension. Apply to the affected nail(s) once (at bedtime) or twice daily.  . SUMAtriptan (IMITREX) 100 MG tablet Take 1 tablet (100 mg total) by mouth 2 (two) times daily as needed for migraine. May repeat in 2 hours if headache persists or recurs.  . topiramate (TOPAMAX) 25 MG tablet Take one tablet at night for one week, then take 2 tablets at night for one week, then take 3 tablets at night.  . Turmeric POWD by Does not apply route. Pt takes 1-2 times weekly.     Allergies: Allergies  Allergen  Reactions  . Sulfa Antibiotics Itching  . Sulfasalazine Itching    Social History: The patient  reports that she has never smoked. She has never used smokeless tobacco. She reports current alcohol use of about 3.0 standard drinks of alcohol per week. She reports that she does not use drugs.   Family History: The patient's family history includes Asthma in her mother and sister; Diabetes in her brother, brother, mother, and sister; Heart attack in her mother; Hypertension in her brother, mother, and sister.   Review of Systems: Please see the history of present illness.   Otherwise, the review of systems is positive for none.   All other systems are reviewed and negative.   Physical Exam: VS:  BP (!) 170/98 (BP Location: Left Arm, Patient Position: Sitting, Cuff Size: Normal)   Pulse 72   Ht 5\' 2"  (1.575 m)   Wt 163 lb 12.8 oz (74.3 kg)   LMP 04/30/2012   BMI 29.96 kg/m  .  BMI Body mass index is 29.96 kg/m.  Wt Readings from Last 3 Encounters:  05/09/18 163 lb 12.8 oz (74.3 kg)  04/17/18 161 lb 3.2 oz (73.1 kg)  04/09/18 161 lb (73 kg)    General: Pleasant. Alert and in no acute distress. She is obese.   HEENT: Normal.  Neck: Supple, no JVD, carotid bruits, or masses noted.  Cardiac: Regular rate and rhythm. No murmurs, rubs, or gallops. No edema. Decreased pulses in the left foot noted.  Respiratory:  Lungs are clear to auscultation bilaterally with normal work of breathing.  GI: Soft and nontender.  MS: No deformity or atrophy. Gait and ROM intact.  Skin: Warm and dry. Color is normal.  Neuro:  Strength and sensation are intact and no gross focal deficits noted.  Psych: Alert, appropriate and with normal affect.   LABORATORY DATA:  EKG:  EKG is ordered today. This demonstrates NSR.  Lab Results  Component Value Date   WBC 2.3 (L) 04/02/2018   HGB 12.3 04/02/2018   HCT 36.7 04/02/2018   PLT 104 (L) 04/02/2018   GLUCOSE 168 (H) 04/02/2018   ALT 71 (H) 04/02/2018     AST 74 (H) 04/02/2018   NA 138 04/02/2018   K 4.1 04/02/2018   CL 104 04/02/2018   CREATININE 0.94 04/02/2018   BUN 9 04/02/2018   CO2 28 04/02/2018   TSH 1.11 04/15/2015   HGBA1C 7.4 (H) 03/31/2014     BNP (last 3 results) No results for input(s): BNP in the last 8760 hours.  ProBNP (last 3 results) No results for input(s): PROBNP  in the last 8760 hours.   Other Studies Reviewed Today: Result Notes for CARDIAC EVENT MONITOR   Notes recorded by Therisa Doyne on 03/06/2017 at 10:57 AM EST Results given to pt. Pt verbalized understanding.  ------  Notes recorded by Lorretta Harp, MD on 03/02/2017 at 7:33 AM EST 1. SR/ST   Echo Study Conclusions 04/2015  - Left ventricle: The cavity size was normal. Systolic function was   normal. The estimated ejection fraction was in the range of 60%   to 65%. Wall motion was normal; there were no regional wall   motion abnormalities. Doppler parameters are consistent with   abnormal left ventricular relaxation (grade 1 diastolic   dysfunction). There was no evidence of elevated ventricular   filling pressure by Doppler parameters. - Aortic valve: Trileaflet; normal thickness leaflets. There was no   regurgitation. - Aortic root: The aortic root was normal in size. - Mitral valve: Structurally normal valve. There was no   regurgitation. - Left atrium: The atrium was mildly dilated. - Right ventricle: The cavity size was normal. Wall thickness was   normal. Systolic function was normal. - Right atrium: The atrium was normal in size. - Tricuspid valve: There was trivial regurgitation. - Pulmonic valve: There was no regurgitation. - Pulmonary arteries: Systolic pressure was within the normal   range. - Inferior vena cava: The vessel was normal in size. - Pericardium, extracardiac: There was no pericardial effusion.   Assessment/Plan:  1. Chest pain - multiple CV risk factors - will arrange for GXT.   2. Palpitations  - typically with activity - will hold on repeating her monitor - will arrange GXT and see what happens with exercise. Could consider beta blocker - she is somewhat hesitant.   3. Leg pain - may have some decreased pulses in the left foot - lots of risk factors and is diabetic - will check lower extremity doppler study.   4. HTN - BP recheck by me is 150/90 - she wants to monitor at home - will need her cuff checked as well.   5. DM - per PCP  Current medicines are reviewed with the patient today.  The patient does not have concerns regarding medicines other than what has been noted above.  The following changes have been made:  See above.  Labs/ tests ordered today include:    Orders Placed This Encounter  Procedures  . EXERCISE TOLERANCE TEST (ETT)  . EKG 12-Lead     Disposition:   Further disposition pending.    Patient is agreeable to this plan and will call if any problems develop in the interim.   SignedTruitt Merle, NP  05/09/2018 3:26 PM  [facility] [address] [place], [place]  [address] Phone: [phone] Fax: [phone]

## 2018-05-10 DIAGNOSIS — E119 Type 2 diabetes mellitus without complications: Secondary | ICD-10-CM | POA: Diagnosis not present

## 2018-05-10 DIAGNOSIS — R748 Abnormal levels of other serum enzymes: Secondary | ICD-10-CM | POA: Diagnosis not present

## 2018-05-10 DIAGNOSIS — R7989 Other specified abnormal findings of blood chemistry: Secondary | ICD-10-CM | POA: Diagnosis not present

## 2018-05-10 DIAGNOSIS — I10 Essential (primary) hypertension: Secondary | ICD-10-CM | POA: Diagnosis not present

## 2018-05-17 DIAGNOSIS — R748 Abnormal levels of other serum enzymes: Secondary | ICD-10-CM | POA: Diagnosis not present

## 2018-05-17 DIAGNOSIS — D72819 Decreased white blood cell count, unspecified: Secondary | ICD-10-CM | POA: Diagnosis not present

## 2018-05-17 DIAGNOSIS — E119 Type 2 diabetes mellitus without complications: Secondary | ICD-10-CM | POA: Diagnosis not present

## 2018-05-17 DIAGNOSIS — F439 Reaction to severe stress, unspecified: Secondary | ICD-10-CM | POA: Diagnosis not present

## 2018-05-18 ENCOUNTER — Other Ambulatory Visit: Payer: Self-pay | Admitting: Nurse Practitioner

## 2018-05-18 DIAGNOSIS — R079 Chest pain, unspecified: Secondary | ICD-10-CM

## 2018-05-18 DIAGNOSIS — R002 Palpitations: Secondary | ICD-10-CM

## 2018-05-18 DIAGNOSIS — M79605 Pain in left leg: Secondary | ICD-10-CM

## 2018-05-18 DIAGNOSIS — M79604 Pain in right leg: Secondary | ICD-10-CM

## 2018-06-11 ENCOUNTER — Inpatient Hospital Stay (HOSPITAL_COMMUNITY): Admission: RE | Admit: 2018-06-11 | Payer: Federal, State, Local not specified - PPO | Source: Ambulatory Visit

## 2018-07-03 ENCOUNTER — Telehealth: Payer: Self-pay

## 2018-07-03 NOTE — Telephone Encounter (Signed)
Unable to get in contact with the patient to convert their office visit into a webex visit. I left them a voicemail asking to return my call. Office number was provided.   

## 2018-07-09 ENCOUNTER — Ambulatory Visit: Payer: Federal, State, Local not specified - PPO | Admitting: Neurology

## 2018-07-09 ENCOUNTER — Other Ambulatory Visit: Payer: Self-pay

## 2018-07-13 ENCOUNTER — Encounter (HOSPITAL_COMMUNITY): Payer: BLUE CROSS/BLUE SHIELD

## 2018-08-08 DIAGNOSIS — E119 Type 2 diabetes mellitus without complications: Secondary | ICD-10-CM | POA: Diagnosis not present

## 2018-08-08 DIAGNOSIS — D72819 Decreased white blood cell count, unspecified: Secondary | ICD-10-CM | POA: Diagnosis not present

## 2018-08-08 DIAGNOSIS — R748 Abnormal levels of other serum enzymes: Secondary | ICD-10-CM | POA: Diagnosis not present

## 2018-08-15 DIAGNOSIS — E119 Type 2 diabetes mellitus without complications: Secondary | ICD-10-CM | POA: Diagnosis not present

## 2018-08-15 DIAGNOSIS — D696 Thrombocytopenia, unspecified: Secondary | ICD-10-CM | POA: Diagnosis not present

## 2018-08-15 DIAGNOSIS — R748 Abnormal levels of other serum enzymes: Secondary | ICD-10-CM | POA: Diagnosis not present

## 2018-08-15 DIAGNOSIS — D72819 Decreased white blood cell count, unspecified: Secondary | ICD-10-CM | POA: Diagnosis not present

## 2018-08-23 DIAGNOSIS — L0390 Cellulitis, unspecified: Secondary | ICD-10-CM | POA: Diagnosis not present

## 2018-09-05 DIAGNOSIS — R21 Rash and other nonspecific skin eruption: Secondary | ICD-10-CM | POA: Diagnosis not present

## 2018-09-14 ENCOUNTER — Ambulatory Visit (HOSPITAL_COMMUNITY)
Admission: RE | Admit: 2018-09-14 | Payer: Federal, State, Local not specified - PPO | Source: Ambulatory Visit | Attending: Nurse Practitioner | Admitting: Nurse Practitioner

## 2018-09-19 ENCOUNTER — Telehealth: Payer: Self-pay

## 2018-09-19 NOTE — Telephone Encounter (Signed)
-----   Message from Rolland Bimler, RN sent at 09/18/2018  5:03 PM EDT ----- Regarding: Question about tooth extraction Lanelle Bal, This patient left a voice mail Tuesday afternoon asking if it is ok for her to have a tooth extracted.   Dr. Irene Limbo answer: She is neutropenic and has increased risk of secondary infection. If the tooth extraction is necessary and not elective,she could proceed with antibiotic coverage per dentist. She also has an increased risk of COVID exposure as well.  I tried to contact her with the answer, but her voice mail was not accepting calls.  Sandi

## 2018-09-19 NOTE — Telephone Encounter (Signed)
Spoke to patient and made her aware of Dr. Grier Mitts answer. Patient verbalized understanding and stated she will make a decision and call her dentist.

## 2018-09-24 ENCOUNTER — Telehealth: Payer: Self-pay | Admitting: *Deleted

## 2018-09-24 NOTE — Telephone Encounter (Signed)
Late entry for 09/21/2018: Patient called, left voice mail stating she had broken out in red bumps on arms and legs. PCP had given her medication. Now bumps appearing on abdomen. Dr. Irene Limbo informed. Attempted to contact patient x2 to advise that per Dr.Kale, continue to follow up with PCP. No answer and unable to leave voice mail. Contacted patient today. Patient stated she was still using medicine and it was no better. She verbalized understanding and will contact her PCP.

## 2018-09-26 DIAGNOSIS — K7469 Other cirrhosis of liver: Secondary | ICD-10-CM | POA: Diagnosis not present

## 2018-09-26 DIAGNOSIS — K219 Gastro-esophageal reflux disease without esophagitis: Secondary | ICD-10-CM | POA: Diagnosis not present

## 2018-09-26 DIAGNOSIS — Z79899 Other long term (current) drug therapy: Secondary | ICD-10-CM | POA: Diagnosis not present

## 2018-09-26 DIAGNOSIS — E119 Type 2 diabetes mellitus without complications: Secondary | ICD-10-CM | POA: Diagnosis not present

## 2018-09-26 DIAGNOSIS — D61818 Other pancytopenia: Secondary | ICD-10-CM | POA: Diagnosis not present

## 2018-09-26 DIAGNOSIS — K766 Portal hypertension: Secondary | ICD-10-CM | POA: Diagnosis not present

## 2018-09-26 DIAGNOSIS — K7460 Unspecified cirrhosis of liver: Secondary | ICD-10-CM | POA: Diagnosis not present

## 2018-09-26 DIAGNOSIS — R5383 Other fatigue: Secondary | ICD-10-CM | POA: Diagnosis not present

## 2018-09-26 DIAGNOSIS — K7581 Nonalcoholic steatohepatitis (NASH): Secondary | ICD-10-CM | POA: Diagnosis not present

## 2018-09-26 LAB — HEMOGLOBIN A1C: Hemoglobin A1C: 7.1

## 2018-09-26 LAB — LIPID PANEL
Cholesterol: 166 (ref 0–200)
HDL: 39 (ref 35–70)
LDL Cholesterol: 115
Triglycerides: 61 (ref 40–160)

## 2018-10-14 NOTE — Progress Notes (Signed)
HEMATOLOGY/ONCOLOGY CLINIC NOTE  Date of Service: 10/14/2018  Patient Care Team: [name], DO as PCP - General (Family Medicine) Izora Gala, MD as Consulting Physician (Otolaryngology)  Dr. Doristine Devoid in GI at [place]:  Leukopenia   HISTORY OF PRESENTING ILLNESS:  Vicki [name] is a wonderful 58 y.o. female who has been referred to [place] by Dr. Jani Gravel  for evaluation and management of Leukopenia.. The pt reports that she is doing well overall.   The pt reports that she has lost six pounds over the last 4 months and endorses that she has modestly tried to lose weight. The pt also notes that she has sinus headaches. She had pneumonia on 10/28/17 as well.   She denies any problems bleeding.   The pt sees [facility] for management of her liver cirrhosis.  She began Trulicity in the last year after taking metformin for many years. She was stopped on metformin with concerns for her liver disease. She notes that she had Vitamin B12 deficiency, had it replaced, and then saw that the B12 was too high.   The pt notes that she was told that she did not have Sarcoidosis when she saw [facility]. .   The pt notes that she has been taking vinegar instead of her HCTZ for her blood pressure management.   Most recent lab results (10/06/17) of CBC w/diff  is as follows: all values are WNL except for WBC at 2.5k, PLT at 109k.  On review of systems, pt reports stable energy levels, mild intentional weight loss, sinus headaches, recent pneumonia, and denies nose bleeds, gum bleeds, concerns for bleeding, frequent infections, fevers, chills, night sweats, unexpected weight loss, abdominal pains, blood in the stools, and any other symptoms.   On PMHx the pt reports DM, HTN, Cirrhosis. On Social Hx the pt reports having a shot of vodka twice a month  Interval History:   Vicki [name] returns today for management and evaluation of her  leukopenia. The patient's last visit with [place] was on 04/17/2018. The pt reports that she is doing well overall.  The pt reports that she has lost 12 lbs in the past 6 months, without trying to lose weight. She was diagnosed with H-pylori and was prescribed Lansoprazole, Amoxicillin and Clarithromycin, which she has been taking since July 29th.   She also reports rashes on her arms and legs and her PCP thought she was having an allergic reaction. She was given Hydrocortisone, which helped.   The pt is seeing her PCP in a few weeks. She has only been taking Tylenol prn for pain and is no longer taking any of her other medications.  Since the pt's last visit, she had a CT completed at [facility] on 09/26/2018 which revealed "Hepatic findings: 1. No suspicious hepatic mass. 2. Findings of portal hypertension including perigastric varices and a splenorenal shunt. Extrahepatic findings: None."  Lab results today (10/15/2018) of CBC w/diff and CMP is as follows: all values are WNL except for WBC at 2.1, RBC at 3.67, HGB at 11.0, HCT at 32.7, PLT at 35k, neutro abs at 1.0k, CO2 at 20, glucose bld at 128, total protein at 8.2, AST at 69, ALT at 45.  09/26/2018 PLTs at 64k  On review of systems, pt reports skin rashes on her arms and legs, weight loss and denies nose bleeds, blood in the stools, black stools, fevers, night sweats, and any other symptoms.  MEDICAL HISTORY:  Past Medical History:  Diagnosis Date  . Allergy   . Boil   . 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 Diabetes ([place])    type 2  . Elevated liver enzymes   . Fatty liver   . Hyperlipemia   . Hypertension   . 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 PVC's (premature ventricular contractions)     SURGICAL HISTORY: Past Surgical History:  Procedure Laterality Date  . COLONOSCOPY  2014  . ECTOPIC PREGNANCY SURGERY  1990  . TONSILLECTOMY  1980  . UPPER GI ENDOSCOPY  08/2017   [facility]     SOCIAL HISTORY: Social History   Socioeconomic History  . Marital status: Single    Spouse name: Not on file  . Number of children: 1  . Years of education: Not on file  . Highest education level: Some college, no degree  Occupational History    Employer: [employer]  Social Needs  . Financial resource strain: Not on file  . Food insecurity    Worry: Not on file    Inability: Not on file  . Transportation needs    Medical: Not on file    Non-medical: Not on file  Tobacco Use  . Smoking status: Never Smoker  . Smokeless tobacco: Never Used  Substance and Sexual Activity  . Alcohol use: Yes    Alcohol/week: 3.0 standard drinks    Types: 3 Standard drinks or equivalent per week    Comment: occasional  . Drug use: No  . Sexual activity: Yes    Birth control/protection: Condom  Lifestyle  . Physical activity    Days per week: Not on file    Minutes per session: Not on file  . Stress: Not on file  Relationships  . Social Herbalist on phone: Not on file    Gets together: Not on file    Attends religious service: Not on file    Active member of club or organization: Not on file    Attends meetings of clubs or organizations: Not on file    Relationship status: Not on file  . Intimate partner violence    Fear of current or ex partner: Not on file    Emotionally abused: Not on file    Physically abused: Not on file    Forced sexual activity: Not on file  Other Topics Concern  . Not on file  Social History Narrative   Epworth Sleepiness Scale = 3 (as of 04/15/2015)   Caffeine 1 cup/month   Lives at home with spouse    Left handed     FAMILY HISTORY: Family History  Problem Relation Age of Onset  . Hypertension Mother   . Diabetes Mother   . Heart attack Mother   . Asthma Mother        Childhood  . Hypertension Sister   . Diabetes Sister   . Asthma Sister   . Diabetes Brother   . Hypertension Brother   . Diabetes Brother   . Colon cancer Neg Hx      ALLERGIES:  is allergic to sulfa antibiotics and sulfasalazine.  MEDICATIONS:  Current Outpatient Medications  Medication Sig Dispense Refill  . acetaminophen (TYLENOL) 500 MG tablet Take 500 mg by mouth as needed for pain.    [name] alum & mag hydroxide-simeth (MAALOX PLUS) 400-400-40 MG/5ML suspension Take by mouth every 6 (six) hours as needed for indigestion.    [name]  BENICAR HCT 40-25 MG per tablet Take 0.5 tablets by mouth daily. Half tab daily    . BLACK CURRANT SEED OIL PO Take by mouth daily. Pt takes 1-2 tsp liquid daily.    . Dulaglutide (TRULICITY) 1.69 IH/0.3UU SOPN Inject into the skin.    . fluorometholone (FML) 0.1 % ophthalmic suspension Apply to eye.    . NONFORMULARY OR COMPOUNDED ITEM [facility]:  Combination Pain Cream + Ketamine - Baclofen 2%, Doxepin 5%, Gabapentin 6%, Topiramate 2%, Pentoxifylline 3%, Ketamine 10 %, apply 1-2 grams to affected area 3-4 times daily. 120 each 2  . NONFORMULARY OR COMPOUNDED ITEM [facility]:  Antifungal cream - Terbinafine 3%, Fluconazole 2%, Tea Tree Oil 5%, Urea 10%, Ibuprofen 2% in #68m suspension. Apply to the affected nail(s) once (at bedtime) or twice daily. 30 each 5  . SUMAtriptan (IMITREX) 100 MG tablet Take 1 tablet (100 mg total) by mouth 2 (two) times daily as needed for migraine. May repeat in 2 hours if headache persists or recurs. 10 tablet 3  . topiramate (TOPAMAX) 25 MG tablet Take one tablet at night for one week, then take 2 tablets at night for one week, then take 3 tablets at night. 90 tablet 3  . Turmeric POWD by Does not apply route. Pt takes 1-2 times weekly.     No current facility-administered medications for this visit.     REVIEW OF SYSTEMS:    A 10+ POINT REVIEW OF SYSTEMS WAS OBTAINED including neurology, dermatology, psychiatry, cardiac, respiratory, lymph, extremities, GI, GU, Musculoskeletal, constitutional, breasts, reproductive, HEENT.  All pertinent positives are noted in the HPI.  All others are  negative.   PHYSICAL EXAMINATION:  . Vitals:   10/15/18 1453  BP: 137/74  Pulse: 85  Resp: 18  Temp: 99.3 F (37.4 C)  SpO2: 100%   Filed Weights   10/15/18 1453  Weight: 151 lb 9.6 oz (68.8 kg)   .Body mass index is 27.73 kg/m.  GENERAL:alert, in no acute distress and comfortable SKIN: no acute rashes, no significant lesions EYES: conjunctiva are pink and non-injected, sclera anicteric OROPHARYNX: MMM, no exudates, no oropharyngeal erythema or ulceration NECK: supple, no JVD LYMPH:  no palpable lymphadenopathy in the cervical, axillary or inguinal regions LUNGS: clear to auscultation b/l with normal respiratory effort HEART: regular rate & rhythm ABDOMEN:  normoactive bowel sounds , non tender, not distended. No palpable hepatosplenomegaly.  Extremity: 1+ pedal edema PSYCH: alert & oriented x 3 with fluent speech NEURO: no focal motor/sensory deficits   LABORATORY DATA:  I have reviewed the data as listed  . CBC Latest Ref Rng & Units 10/15/2018 04/02/2018 11/27/2017  WBC 4.0 - 10.5 K/uL 2.1(L) 2.3(L) 2.8(L)  Hemoglobin 12.0 - 15.0 g/dL 11.0(L) 12.3 12.8  Hematocrit 36.0 - 46.0 % 32.7(L) 36.7 38.1  Platelets 150 - 400 K/uL 35(L) 104(L) 105(L)   ANC 1.6k . CBC    Component Value Date/Time   WBC 2.1 (L) 10/15/2018 1415   RBC 3.67 (L) 10/15/2018 1415   RBC 3.64 (L) 10/15/2018 1415   HGB 11.0 (L) 10/15/2018 1415   HCT 32.7 (L) 10/15/2018 1415   HCT 38.7 11/27/2017 1418   PLT 35 (L) 10/15/2018 1415   MCV 89.1 10/15/2018 1415   MCH 30.0 10/15/2018 1415   MCHC 33.6 10/15/2018 1415   RDW 13.6 10/15/2018 1415   LYMPHSABS 0.7 10/15/2018 1415   MONOABS 0.2 10/15/2018 1415   EOSABS 0.1 10/15/2018 1415   BASOSABS 0.0 10/15/2018 1415    .  CMP Latest Ref Rng & Units 10/15/2018 04/02/2018 11/27/2017  Glucose 70 - 99 mg/dL 128(H) 168(H) 116(H)  BUN 6 - 20 mg/dL 9 9 7   Creatinine 0.44 - 1.00 mg/dL 0.91 0.94 0.81  Sodium 135 - 145 mmol/L 138 138 141  Potassium 3.5 -  5.1 mmol/L 3.8 4.1 4.0  Chloride 98 - 111 mmol/L 108 104 104  CO2 22 - 32 mmol/L 20(L) 28 29  Calcium 8.9 - 10.3 mg/dL 9.2 9.8 10.0  Total Protein 6.5 - 8.1 g/dL 8.2(H) 8.3(H) 8.5(H)  Total Bilirubin 0.3 - 1.2 mg/dL 1.0 0.9 1.0  Alkaline Phos 38 - 126 U/L 98 107 122  AST 15 - 41 U/L 69(H) 74(H) 162(H)  ALT 0 - 44 U/L 45(H) 71(H) 131(H)   10/06/17 CBC w/diff:     RADIOGRAPHIC STUDIES: I have personally reviewed the radiological images as listed and agreed with the findings in the report. No results found. 04/18/17 CT Cirrhosis Abdomen     ASSESSMENT & PLAN:  58 y.o. female with  1. Leukopenia - lymphopenia Labs upon initial presentation 10/06/17, HGB normal, WBC at 2.5k, PLT at 109k. Ferritin at 508   2. Thrombocytopenia 04/18/17 CT Cirrhosis Abdomen which revealed perisplenic varices and a splenorenal shunt with concern for portal hypertension.  12/11/17 NM Liver Spleen scan revealed Normal colloid distribution between the liver, spleen and bone marrow, without evidence of colloid shift or hypersplenism. Questionable space-occupying lesion versus inhomogeneous uptake of tracer in the lateral segment LEFT lobe liver, unable to exclude mass; followup MR imaging with and without contrast recommended to exclude hepatic mass lesion.    04/11/18 CT A/P which revealed No focal hepatic lesion. 2. Prominence of caudate lobe and venous collateralization suggests early cirrhosis and portal hypertension.  09/26/2018 CT abd w contrast completed at [facility] revealed "Hepatic findings: 1. No suspicious hepatic mass. 2. Findings of portal hypertension including perigastric varices and a splenorenal shunt. Extrahepatic findings: None."  PLAN -Discussed pt labwork today, 10/15/2018; PLT have dropped to 35k,  -Discussed that H. pylori medication can lead to decreased PLTs, but they were low before she began the meds. -Discussed that because her PLTs are dropping faster than expected from liver cirrhosis  alone and her other blood counts are dropping as well, I would recommend a BM Bx at this time. 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 Discussed that this is a time sensitive issue and that if her PLTs decrease to below 30k, there is a high risk of bleeding. Pt verbalized understanding. -Will see the pt back in 2 months   RTC with Dr Irene Limbo with labs in 2 months   All of the patients questions were answered with apparent satisfaction. The patient knows to call the clinic with any problems, questions or concerns.  The total time spent in the appt was 30 minutes and more than 50% was on counseling and direct patient cares.   Sullivan Lone MD MS AAHIVMS [facility] Hematology/Oncology Physician [facility]  (Office):       [phone] (Work cell):  [phone] (Fax):           [phone]  10/14/2018 10:39 PM  I, De Burrs, am acting  as a scribe for Dr. Irene Limbo  .I have reviewed the above documentation for accuracy and completeness, and I agree with the above. Brunetta Genera MD

## 2018-10-15 ENCOUNTER — Encounter: Payer: Self-pay | Admitting: *Deleted

## 2018-10-15 ENCOUNTER — Other Ambulatory Visit: Payer: Self-pay

## 2018-10-15 ENCOUNTER — Inpatient Hospital Stay (HOSPITAL_BASED_OUTPATIENT_CLINIC_OR_DEPARTMENT_OTHER): Payer: Federal, State, Local not specified - PPO | Admitting: Hematology

## 2018-10-15 ENCOUNTER — Inpatient Hospital Stay: Payer: Federal, State, Local not specified - PPO | Attending: Hematology

## 2018-10-15 VITALS — BP 137/74 | HR 85 | Temp 99.3°F | Resp 18 | Ht 62.0 in | Wt 151.6 lb

## 2018-10-15 DIAGNOSIS — E119 Type 2 diabetes mellitus without complications: Secondary | ICD-10-CM | POA: Diagnosis not present

## 2018-10-15 DIAGNOSIS — R634 Abnormal weight loss: Secondary | ICD-10-CM | POA: Insufficient documentation

## 2018-10-15 DIAGNOSIS — R21 Rash and other nonspecific skin eruption: Secondary | ICD-10-CM | POA: Diagnosis not present

## 2018-10-15 DIAGNOSIS — D696 Thrombocytopenia, unspecified: Secondary | ICD-10-CM

## 2018-10-15 DIAGNOSIS — Z794 Long term (current) use of insulin: Secondary | ICD-10-CM | POA: Insufficient documentation

## 2018-10-15 DIAGNOSIS — D72819 Decreased white blood cell count, unspecified: Secondary | ICD-10-CM | POA: Diagnosis not present

## 2018-10-15 DIAGNOSIS — I10 Essential (primary) hypertension: Secondary | ICD-10-CM | POA: Diagnosis not present

## 2018-10-15 LAB — CBC WITH DIFFERENTIAL/PLATELET
Abs Immature Granulocytes: 0 10*3/uL (ref 0.00–0.07)
Basophils Absolute: 0 10*3/uL (ref 0.0–0.1)
Basophils Relative: 1 %
Eosinophils Absolute: 0.1 10*3/uL (ref 0.0–0.5)
Eosinophils Relative: 6 %
HCT: 32.7 % — ABNORMAL LOW (ref 36.0–46.0)
Hemoglobin: 11 g/dL — ABNORMAL LOW (ref 12.0–15.0)
Immature Granulocytes: 0 %
Lymphocytes Relative: 35 %
Lymphs Abs: 0.7 10*3/uL (ref 0.7–4.0)
MCH: 30 pg (ref 26.0–34.0)
MCHC: 33.6 g/dL (ref 30.0–36.0)
MCV: 89.1 fL (ref 80.0–100.0)
Monocytes Absolute: 0.2 10*3/uL (ref 0.1–1.0)
Monocytes Relative: 11 %
Neutro Abs: 1 10*3/uL — ABNORMAL LOW (ref 1.7–7.7)
Neutrophils Relative %: 47 %
Platelets: 35 10*3/uL — ABNORMAL LOW (ref 150–400)
RBC: 3.67 MIL/uL — ABNORMAL LOW (ref 3.87–5.11)
RDW: 13.6 % (ref 11.5–15.5)
WBC: 2.1 10*3/uL — ABNORMAL LOW (ref 4.0–10.5)
nRBC: 0 % (ref 0.0–0.2)

## 2018-10-15 LAB — CMP (CANCER CENTER ONLY)
ALT: 45 U/L — ABNORMAL HIGH (ref 0–44)
AST: 69 U/L — ABNORMAL HIGH (ref 15–41)
Albumin: 3.8 g/dL (ref 3.5–5.0)
Alkaline Phosphatase: 98 U/L (ref 38–126)
Anion gap: 10 (ref 5–15)
BUN: 9 mg/dL (ref 6–20)
CO2: 20 mmol/L — ABNORMAL LOW (ref 22–32)
Calcium: 9.2 mg/dL (ref 8.9–10.3)
Chloride: 108 mmol/L (ref 98–111)
Creatinine: 0.91 mg/dL (ref 0.44–1.00)
GFR, Est AFR Am: >60 mL/min (ref >60)
GFR, Estimated: >60 mL/min (ref >60)
Glucose, Bld: 128 mg/dL — ABNORMAL HIGH (ref 70–99)
Potassium: 3.8 mmol/L (ref 3.5–5.1)
Sodium: 138 mmol/L (ref 135–145)
Total Bilirubin: 1 mg/dL (ref 0.3–1.2)
Total Protein: 8.2 g/dL — ABNORMAL HIGH (ref 6.5–8.1)

## 2018-10-15 LAB — RETICULOCYTES
Immature Retic Fract: 11.9 % (ref 2.3–15.9)
RBC.: 3.64 MIL/uL — ABNORMAL LOW (ref 3.87–5.11)
Retic Count, Absolute: 40 10*3/uL (ref 19.0–186.0)
Retic Ct Pct: 1.1 % (ref 0.4–3.1)

## 2018-10-16 ENCOUNTER — Other Ambulatory Visit: Payer: Federal, State, Local not specified - PPO

## 2018-10-16 ENCOUNTER — Ambulatory Visit: Payer: Federal, State, Local not specified - PPO | Admitting: Hematology

## 2018-10-16 ENCOUNTER — Telehealth: Payer: Self-pay | Admitting: Hematology

## 2018-10-16 NOTE — Telephone Encounter (Signed)
I left a message regarding schedule I will mail

## 2018-10-24 DIAGNOSIS — L02224 Furuncle of groin: Secondary | ICD-10-CM | POA: Diagnosis not present

## 2018-10-24 DIAGNOSIS — L308 Other specified dermatitis: Secondary | ICD-10-CM | POA: Diagnosis not present

## 2018-10-24 DIAGNOSIS — B9689 Other specified bacterial agents as the cause of diseases classified elsewhere: Secondary | ICD-10-CM | POA: Diagnosis not present

## 2018-11-01 DIAGNOSIS — D649 Anemia, unspecified: Secondary | ICD-10-CM | POA: Diagnosis not present

## 2018-11-01 DIAGNOSIS — D696 Thrombocytopenia, unspecified: Secondary | ICD-10-CM | POA: Diagnosis not present

## 2018-11-01 DIAGNOSIS — D892 Hypergammaglobulinemia, unspecified: Secondary | ICD-10-CM | POA: Diagnosis not present

## 2018-11-01 DIAGNOSIS — A048 Other specified bacterial intestinal infections: Secondary | ICD-10-CM | POA: Diagnosis not present

## 2018-11-01 DIAGNOSIS — D61818 Other pancytopenia: Secondary | ICD-10-CM | POA: Diagnosis not present

## 2018-11-01 DIAGNOSIS — D708 Other neutropenia: Secondary | ICD-10-CM | POA: Diagnosis not present

## 2018-11-01 DIAGNOSIS — R809 Proteinuria, unspecified: Secondary | ICD-10-CM | POA: Diagnosis not present

## 2018-11-02 DIAGNOSIS — D61818 Other pancytopenia: Secondary | ICD-10-CM | POA: Diagnosis not present

## 2018-11-02 DIAGNOSIS — R809 Proteinuria, unspecified: Secondary | ICD-10-CM | POA: Diagnosis not present

## 2018-11-08 DIAGNOSIS — M2540 Effusion, unspecified joint: Secondary | ICD-10-CM | POA: Diagnosis not present

## 2018-11-08 DIAGNOSIS — R748 Abnormal levels of other serum enzymes: Secondary | ICD-10-CM | POA: Diagnosis not present

## 2018-11-08 DIAGNOSIS — Z0000 Encounter for general adult medical examination without abnormal findings: Secondary | ICD-10-CM | POA: Diagnosis not present

## 2018-11-08 DIAGNOSIS — D72819 Decreased white blood cell count, unspecified: Secondary | ICD-10-CM | POA: Diagnosis not present

## 2018-11-08 DIAGNOSIS — E119 Type 2 diabetes mellitus without complications: Secondary | ICD-10-CM | POA: Diagnosis not present

## 2018-11-15 DIAGNOSIS — E119 Type 2 diabetes mellitus without complications: Secondary | ICD-10-CM | POA: Diagnosis not present

## 2018-11-15 DIAGNOSIS — K7581 Nonalcoholic steatohepatitis (NASH): Secondary | ICD-10-CM | POA: Diagnosis not present

## 2018-11-15 DIAGNOSIS — D696 Thrombocytopenia, unspecified: Secondary | ICD-10-CM | POA: Diagnosis not present

## 2018-11-15 DIAGNOSIS — D72819 Decreased white blood cell count, unspecified: Secondary | ICD-10-CM | POA: Diagnosis not present

## 2018-11-21 ENCOUNTER — Telehealth: Payer: Self-pay | Admitting: *Deleted

## 2018-11-21 ENCOUNTER — Other Ambulatory Visit: Payer: Self-pay | Admitting: Hematology

## 2018-11-21 DIAGNOSIS — D72819 Decreased white blood cell count, unspecified: Secondary | ICD-10-CM

## 2018-11-21 DIAGNOSIS — D649 Anemia, unspecified: Secondary | ICD-10-CM

## 2018-11-21 DIAGNOSIS — D696 Thrombocytopenia, unspecified: Secondary | ICD-10-CM

## 2018-11-21 NOTE — Telephone Encounter (Signed)
Attempted to contact patient regarding bone marrow biopsy following patient call to state she did want to do it.  LVM: Dr. Irene Limbo has ordered it and radiology scheduling should be contacting her to schedule procedure. Encouraged her to contact office for questions.

## 2018-11-27 ENCOUNTER — Other Ambulatory Visit: Payer: Self-pay | Admitting: Radiology

## 2018-11-27 ENCOUNTER — Telehealth: Payer: Self-pay | Admitting: *Deleted

## 2018-11-27 NOTE — Telephone Encounter (Signed)
Patient called with questions about ask upcoming CT/biopsy: Will she need to be out of work the next day? Per Dr. Irene Limbo, depends of type of work and residual soreness. How long before he will have results and how will she find out? Per Dr.Kale, in 4-7 days and he will contact her.  Patient contacted with this information. She verbalized understanding.

## 2018-11-28 ENCOUNTER — Other Ambulatory Visit: Payer: Self-pay | Admitting: Radiology

## 2018-11-29 ENCOUNTER — Encounter (HOSPITAL_COMMUNITY): Payer: Self-pay

## 2018-11-29 ENCOUNTER — Other Ambulatory Visit: Payer: Self-pay

## 2018-11-29 ENCOUNTER — Ambulatory Visit (HOSPITAL_COMMUNITY)
Admission: RE | Admit: 2018-11-29 | Discharge: 2018-11-29 | Disposition: A | Discharge status: Home / Self Care | Payer: Federal, State, Local not specified - PPO | Source: Ambulatory Visit | Attending: Hematology | Admitting: Hematology

## 2018-11-29 DIAGNOSIS — Z833 Family history of diabetes mellitus: Secondary | ICD-10-CM | POA: Diagnosis not present

## 2018-11-29 DIAGNOSIS — D7589 Other specified diseases of blood and blood-forming organs: Secondary | ICD-10-CM | POA: Diagnosis not present

## 2018-11-29 DIAGNOSIS — Z882 Allergy status to sulfonamides status: Secondary | ICD-10-CM | POA: Diagnosis not present

## 2018-11-29 DIAGNOSIS — I10 Essential (primary) hypertension: Secondary | ICD-10-CM | POA: Insufficient documentation

## 2018-11-29 DIAGNOSIS — D696 Thrombocytopenia, unspecified: Secondary | ICD-10-CM | POA: Diagnosis not present

## 2018-11-29 DIAGNOSIS — E119 Type 2 diabetes mellitus without complications: Secondary | ICD-10-CM | POA: Diagnosis not present

## 2018-11-29 DIAGNOSIS — I493 Ventricular premature depolarization: Secondary | ICD-10-CM | POA: Insufficient documentation

## 2018-11-29 DIAGNOSIS — Z8249 Family history of ischemic heart disease and other diseases of the circulatory system: Secondary | ICD-10-CM | POA: Insufficient documentation

## 2018-11-29 DIAGNOSIS — D649 Anemia, unspecified: Secondary | ICD-10-CM | POA: Insufficient documentation

## 2018-11-29 DIAGNOSIS — D72819 Decreased white blood cell count, unspecified: Secondary | ICD-10-CM | POA: Diagnosis not present

## 2018-11-29 DIAGNOSIS — D61818 Other pancytopenia: Secondary | ICD-10-CM | POA: Diagnosis not present

## 2018-11-29 LAB — CBC WITH DIFFERENTIAL/PLATELET
Abs Immature Granulocytes: 0.01 10*3/uL (ref 0.00–0.07)
Basophils Absolute: 0 10*3/uL (ref 0.0–0.1)
Basophils Relative: 1 %
Eosinophils Absolute: 0.1 10*3/uL (ref 0.0–0.5)
Eosinophils Relative: 6 %
HCT: 34.4 % — ABNORMAL LOW (ref 36.0–46.0)
Hemoglobin: 11.3 g/dL — ABNORMAL LOW (ref 12.0–15.0)
Immature Granulocytes: 1 %
Lymphocytes Relative: 33 %
Lymphs Abs: 0.6 10*3/uL — ABNORMAL LOW (ref 0.7–4.0)
MCH: 30.8 pg (ref 26.0–34.0)
MCHC: 32.8 g/dL (ref 30.0–36.0)
MCV: 93.7 fL (ref 80.0–100.0)
Monocytes Absolute: 0.2 10*3/uL (ref 0.1–1.0)
Monocytes Relative: 11 %
Neutro Abs: 0.9 10*3/uL — ABNORMAL LOW (ref 1.7–7.7)
Neutrophils Relative %: 48 %
Platelets: 39 10*3/uL — ABNORMAL LOW (ref 150–400)
RBC: 3.67 MIL/uL — ABNORMAL LOW (ref 3.87–5.11)
RDW: 13.9 % (ref 11.5–15.5)
WBC: 1.8 10*3/uL — ABNORMAL LOW (ref 4.0–10.5)
nRBC: 0 % (ref 0.0–0.2)

## 2018-11-29 LAB — GLUCOSE, CAPILLARY: Glucose-Capillary: 143 mg/dL — ABNORMAL HIGH (ref 70–99)

## 2018-11-29 LAB — PROTIME-INR
INR: 1.2 (ref 0.8–1.2)
Prothrombin Time: 14.7 seconds (ref 11.4–15.2)

## 2018-11-29 MED ORDER — FENTANYL CITRATE (PF) 100 MCG/2ML IJ SOLN
INTRAMUSCULAR | Status: AC
  Filled 2018-11-29: qty 2

## 2018-11-29 MED ORDER — MIDAZOLAM HCL 2 MG/2ML IJ SOLN
INTRAMUSCULAR | Status: AC
  Filled 2018-11-29: qty 4

## 2018-11-29 MED ORDER — SODIUM CHLORIDE 0.9 % IV SOLN
INTRAVENOUS | Status: DC
  Administered 2018-11-29: 10:00:00 via INTRAVENOUS

## 2018-11-29 MED ORDER — LIDOCAINE HCL (PF) 1 % IJ SOLN
INTRAMUSCULAR | Status: AC | PRN
  Administered 2018-11-29: 10 mL

## 2018-11-29 MED ORDER — MIDAZOLAM HCL 2 MG/2ML IJ SOLN
INTRAMUSCULAR | Status: AC | PRN
  Administered 2018-11-29 (×4): 1 mg via INTRAVENOUS

## 2018-11-29 MED ORDER — FENTANYL CITRATE (PF) 100 MCG/2ML IJ SOLN
INTRAMUSCULAR | Status: AC | PRN
  Administered 2018-11-29 (×2): 50 ug via INTRAVENOUS

## 2018-11-29 NOTE — H&P (Signed)
Chief Complaint: Patient was seen in consultation today for progressive pancytopenia/bone marrow biopsy and aspiration.  Referring Physician(s): Brunetta Genera  Supervising Physician: Aletta Edouard  Patient Status: [facility] - Out-pt  History of Present Illness: Vicki [name] is a 58 y.o. female with a past medical history of hypertension, hyperlipidemia, PVCs, migraines, cirrhosis, pancytopenia, and diabetes mellitus. She was originally referred to Dr. Irene Limbo for management of leukopenia and thrombocytopenia. Her follow-up labs with Dr. Irene Limbo also demonstrated low platelets. Given that all three blood cell counts are decreased, Dr. Irene Limbo recommends bone marrow biopsy/aspiration for further evaluation.  IR requested by Dr. Irene Limbo for possible image-guided bone marrow biopsy/aspiration. Patient awake and alert sitting in bed with no complaints at this time. Denies headache, weakness, numbness/tingling, dizziness, vision changes, hearing changes, tinnitus, or speech difficulty.   Past Medical History:  Diagnosis Date  . Allergy   . Boil   . 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 Diabetes ([place])    type 2  . Elevated liver enzymes   . Fatty liver   . Hyperlipemia   . Hypertension   . 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 PVC's (premature ventricular contractions)     Past Surgical History:  Procedure Laterality Date  . COLONOSCOPY  2014  . ECTOPIC PREGNANCY SURGERY  1990  . TONSILLECTOMY  1980  . UPPER GI ENDOSCOPY  08/2017   [facility]    Allergies: Sulfa antibiotics and Sulfasalazine  Medications: Prior to Admission medications   Medication Sig Start Date End Date Taking? Authorizing Provider  acetaminophen (TYLENOL) 500 MG tablet Take 500 mg by mouth as needed for pain.    [provider]  alum & mag hydroxide-simeth (MAALOX PLUS) 400-400-40 MG/5ML suspension Take by mouth every 6 (six) hours as needed for  indigestion.    [provider]  BENICAR HCT 40-25 MG per tablet Take 0.5 tablets by mouth daily. Half tab daily 09/02/12   [provider]  BLACK CURRANT SEED OIL PO Take by mouth daily. Pt takes 1-2 tsp liquid daily.    [provider]  Dulaglutide (TRULICITY) 7.67 MC/9.4BS SOPN Inject into the skin.    [provider]  fluorometholone (FML) 0.1 % ophthalmic suspension Apply to eye.    [provider]  NONFORMULARY OR COMPOUNDED [place]:  Combination Pain Cream + Ketamine - Baclofen 2%, Doxepin 5%, Gabapentin 6%, Topiramate 2%, Pentoxifylline 3%, Ketamine 10 %, apply 1-2 grams to affected area 3-4 times daily. 08/29/17   Landis Martins, DPM  NONFORMULARY OR COMPOUNDED ITEM [facility]:  Antifungal cream - Terbinafine 3%, Fluconazole 2%, Tea Tree Oil 5%, Urea 10%, Ibuprofen 2% in #92m suspension. Apply to the affected nail(s) once (at bedtime) or twice daily. 05/09/18   SLandis Martins DPM  SUMAtriptan (IMITREX) 100 MG tablet Take 1 tablet (100 mg total) by mouth 2 (two) times daily as needed for migraine. May repeat in 2 hours if headache persists or recurs. 04/09/18   WKathrynn Ducking MD  topiramate (TOPAMAX) 25 MG tablet Take one tablet at night for one week, then take 2 tablets at night for one week, then take 3 tablets at night. 04/09/18   WKathrynn Ducking MD  Turmeric POWD by Does not apply route. Pt takes 1-2 times weekly.    [provider]     Family History  Problem Relation Age of Onset  . Hypertension Mother   . Diabetes Mother   .  Heart attack Mother   . Asthma Mother        Childhood  . Hypertension Sister   . Diabetes Sister   . Asthma Sister   . Diabetes Brother   . Hypertension Brother   . Diabetes Brother   . Colon cancer Neg Hx     Social History   Socioeconomic History  . Marital status: Single    Spouse name: Not on file  . Number of children: 1  . Years of education: Not on file  .  Highest education level: Some college, no degree  Occupational History    Employer: [employer]  Social Needs  . Financial resource strain: Not on file  . Food insecurity    Worry: Not on file    Inability: Not on file  . Transportation needs    Medical: Not on file    Non-medical: Not on file  Tobacco Use  . Smoking status: Never Smoker  . Smokeless tobacco: Never Used  Substance and Sexual Activity  . Alcohol use: Yes    Alcohol/week: 3.0 standard drinks    Types: 3 Standard drinks or equivalent per week    Comment: occasional  . Drug use: No  . Sexual activity: Yes    Birth control/protection: Condom  Lifestyle  . Physical activity    Days per week: Not on file    Minutes per session: Not on file  . Stress: Not on file  Relationships  . Social Herbalist on phone: Not on file    Gets together: Not on file    Attends religious service: Not on file    Active member of club or organization: Not on file    Attends meetings of clubs or organizations: Not on file    Relationship status: Not on file  Other Topics Concern  . Not on file  Social History Narrative   Epworth Sleepiness Scale = 3 (as of 04/15/2015)   Caffeine 1 cup/month   Lives at home with spouse    Left handed      Review of Systems: A 12 point ROS discussed and pertinent positives are indicated in the HPI above.  All other systems are negative.  Review of Systems  Constitutional: Negative for chills and fever.  Respiratory: Negative for shortness of breath and wheezing.   Cardiovascular: Negative for chest pain and palpitations.  Gastrointestinal: Negative for abdominal pain.  Neurological: Negative for headaches.  Psychiatric/Behavioral: Negative for behavioral problems and confusion.    Vital Signs: LMP 04/30/2012   Physical Exam Vitals signs and nursing note reviewed.  Constitutional:      General: She is not in acute distress.    Appearance: Normal appearance.  Cardiovascular:     Rate  and Rhythm: Normal rate and regular rhythm.     Heart sounds: Normal heart sounds. No murmur.  Pulmonary:     Effort: Pulmonary effort is normal. No respiratory distress.     Breath sounds: Normal breath sounds. No wheezing.  Skin:    General: Skin is warm and dry.  Neurological:     Mental Status: She is alert and oriented to person, place, and time.  Psychiatric:        Mood and Affect: Mood normal.        Behavior: Behavior normal.        Thought Content: Thought content normal.        Judgment: Judgment normal.      MD Evaluation Airway: WNL  Heart: WNL Abdomen: WNL Chest/ Lungs: WNL ASA  Classification: 2 Mallampati/Airway Score: Two   Imaging: No results found.  Labs:  CBC: Recent Labs    04/02/18 1337 10/15/18 1415  WBC 2.3* 2.1*  HGB 12.3 11.0*  HCT 36.7 32.7*  PLT 104* 35*    COAGS: Recent Labs    11/29/18 1005  INR 1.2    BMP: Recent Labs    04/02/18 1337 10/15/18 1415  NA 138 138  K 4.1 3.8  CL 104 108  CO2 28 20*  GLUCOSE 168* 128*  BUN 9 9  CALCIUM 9.8 9.2  CREATININE 0.94 0.91  GFRNONAA >60 >60  GFRAA >60 >60    LIVER FUNCTION TESTS: Recent Labs    04/02/18 1337 10/15/18 1415  BILITOT 0.9 1.0  AST 74* 69*  ALT 71* 45*  ALKPHOS 107 98  PROT 8.3* 8.2*  ALBUMIN 4.0 3.8     Assessment and Plan:  Progressive pancytopenia without know etiology. Plan for image-guided bone marrow biopsy/aspiration today with Dr. Kathlene Cote. Patient is NPO. Afebrile. She does not take blood thinners. INR 1.2 today.  Risks and benefits discussed with the patient including, but not limited to bleeding, infection, damage to adjacent structures or low yield requiring additional tests. All of the patient's questions were answered, patient is agreeable to proceed. Consent signed and in chart.   Thank you for this interesting consult.  I greatly enjoyed meeting Vicki [name] and look forward to participating in their care.  A copy of this  report was sent to the requesting provider on this date.  Electronically Signed: Earley Abide, PA-C 11/29/2018, 10:38 AM   I spent a total of 40 Minutes in face to face in clinical consultation, greater than 50% of which was counseling/coordinating care for progressive pancytopenia/bone marrow biopsy and aspiration.

## 2018-11-29 NOTE — Procedures (Signed)
Interventional Radiology Procedure Note  Procedure: CT guided bone marrow aspiration and biopsy  Complications: None  EBL: < 10 mL  Findings: Aspirate and core biopsy performed of bone marrow in right iliac bone.  Plan: Bedrest supine x 1 hrs  Sayge Brienza T. Robyn Nohr, M.D Pager:  [phone]   

## 2018-11-29 NOTE — Discharge Instructions (Signed)
Please call IR clinic [phone] or [phone] and ask for IR MD on call with any questions or concerns.  Moderate Conscious Sedation, Adult, Care After These instructions provide you with information about caring for yourself after your procedure. Your health care provider may also give you more specific instructions. Your treatment has been planned according to current medical practices, but problems sometimes occur. Call your health care provider if you have any problems or questions after your procedure. What can I expect after the procedure? After your procedure, it is common:  To feel sleepy for several hours.  To feel clumsy and have poor balance for several hours.  To have poor judgment for several hours.  To vomit if you eat too soon. Follow these instructions at home: For at least 24 hours after the procedure:   Do not: ? Participate in activities where you could fall or become injured. ? Drive. ? Use heavy machinery. ? Drink alcohol. ? Take sleeping pills or medicines that cause drowsiness. ? Make important decisions or sign legal documents. ? Take care of children on your own.  Rest. Eating and drinking  Follow the diet recommended by your health care provider.  If you vomit: ? Drink water, juice, or soup when you can drink without vomiting. ? Make sure you have little or no nausea before eating solid foods. General instructions  Have a responsible adult stay with you until you are awake and alert.  Take over-the-counter and prescription medicines only as told by your health care provider.  If you smoke, do not smoke without supervision.  Keep all follow-up visits as told by your health care provider. This is important. Contact a health care provider if:  You keep feeling nauseous or you keep vomiting.  You feel light-headed.  You develop a rash.  You have a fever. Get help right away if:  You have trouble breathing. This information is not  intended to replace advice given to you by your health care provider. Make sure you discuss any questions you have with your health care provider. Document Released: 12/12/2012 Document Revised: 02/03/2017 Document Reviewed: 06/13/2015 Elsevier Patient Education  2020 [employer].   Bone Marrow Aspiration and Bone Marrow Biopsy, Adult, Care After This sheet gives you information about how to care for yourself after your procedure. Your health care provider may also give you more specific instructions. If you have problems or questions, contact your health care provider. What can I expect after the procedure? After the procedure, it is common to have:  Mild pain and tenderness.  Swelling.  Bruising. Follow these instructions at home: Puncture site care      Follow instructions from your health care provider about how to take care of the puncture site. Make sure you: ? Wash your hands with soap and water before you change your bandage (dressing). If soap and water are not available, use hand sanitizer. ? Change your dressing as told by your health care provider.  You may remove your dressing tomorrow.  Check your puncture siteevery day for signs of infection. Check for: ? More redness, swelling, or pain. ? More fluid or blood. ? Warmth. ? Pus or a bad smell. General instructions  Take over-the-counter and prescription medicines only as told by your health care provider.  Do not take baths, swim, or use a hot tub until your health care provider approves. Ask if you can take a shower or have a sponge bath.  You may shower tomorrow.  Return  to your normal activities as told by your health care provider. Ask your health care provider what activities are safe for you.  Do not drive for 24 hours if you were given a medicine to help you relax (sedative) during your procedure.  Keep all follow-up visits as told by your health care provider. This is important. Contact a health care  provider if:  Your pain is not controlled with medicine. Get help right away if:  You have a fever.  You have more redness, swelling, or pain around the puncture site.  You have more fluid or blood coming from the puncture site.  Your puncture site feels warm to the touch.  You have pus or a bad smell coming from the puncture site. These symptoms may represent a serious problem that is an emergency. Do not wait to see if the symptoms will go away. Get medical help right away. Call your local emergency services (911 in the [place] Do not drive yourself to the hospital. Summary  After the procedure, it is common to have mild pain, tenderness, swelling, and bruising.  Follow instructions from your health care provider about how to take care of the puncture site.  Get help right away if you have any symptoms of infection or if you have more blood or fluid coming from the puncture site. This information is not intended to replace advice given to you by your health care provider. Make sure you discuss any questions you have with your health care provider. Document Released: 09/10/2004 Document Revised: 06/06/2017 Document Reviewed: 08/05/2015 Elsevier Patient Education  2020 [employer].

## 2018-11-30 ENCOUNTER — Telehealth: Payer: Self-pay | Admitting: Hematology

## 2018-11-30 ENCOUNTER — Other Ambulatory Visit: Payer: Self-pay

## 2018-11-30 NOTE — Telephone Encounter (Signed)
Left message re 10/1 phone visit. No afternoon appointments and no availability 9/29 or 9/30. Schedule mailed.

## 2018-12-05 ENCOUNTER — Telehealth: Payer: Self-pay | Admitting: Hematology

## 2018-12-05 LAB — SURGICAL PATHOLOGY

## 2018-12-05 NOTE — Telephone Encounter (Signed)
Contacted patient to verify telephone visit for pre reg °

## 2018-12-06 ENCOUNTER — Telehealth: Payer: Self-pay | Admitting: Hematology

## 2018-12-06 ENCOUNTER — Encounter (HOSPITAL_COMMUNITY): Payer: Self-pay | Admitting: Hematology

## 2018-12-06 ENCOUNTER — Inpatient Hospital Stay: Payer: Federal, State, Local not specified - PPO | Attending: Hematology | Admitting: Hematology

## 2018-12-06 DIAGNOSIS — D72819 Decreased white blood cell count, unspecified: Secondary | ICD-10-CM | POA: Insufficient documentation

## 2018-12-06 DIAGNOSIS — D61818 Other pancytopenia: Secondary | ICD-10-CM | POA: Diagnosis not present

## 2018-12-06 DIAGNOSIS — D696 Thrombocytopenia, unspecified: Secondary | ICD-10-CM | POA: Diagnosis not present

## 2018-12-06 NOTE — Telephone Encounter (Signed)
Scheduled appt per 10/1 los.  Left a voice message of appt date and time

## 2018-12-06 NOTE — Progress Notes (Signed)
HEMATOLOGY/ONCOLOGY CLINIC NOTE  Date of Service: 12/06/2018  Patient Care Team: Vicki Morning, DO as PCP - General (Family Medicine) Izora Gala, MD as Consulting Physician (Otolaryngology)  Dr. Doristine Devoid in GI at [place]:  Pancytopenia  HISTORY OF PRESENTING ILLNESS:  Vicki [name] is a wonderful 58 y.o. female who has been referred to [place] by Dr. Jani [name]  for evaluation and management of Leukopenia.. The pt reports that she is doing well overall.   The pt reports that she has lost six pounds over the last 4 months and endorses that she has modestly tried to lose weight. The pt also notes that she has sinus headaches. She had pneumonia on 10/28/17 as well.   She denies any problems bleeding.   The pt sees [facility] for management of her liver cirrhosis.  She began Trulicity in the last year after taking metformin for many years. She was stopped on metformin with concerns for her liver disease. She notes that she had Vitamin B12 deficiency, had it replaced, and then saw that the B12 was too high.   The pt notes that she was told that she did not have Sarcoidosis when she saw [facility]. .   The pt notes that she has been taking vinegar instead of her HCTZ for her blood pressure management.   Most recent lab results (10/06/17) of CBC w/diff  is as follows: all values are WNL except for WBC at 2.5k, PLT at 109k.  On review of systems, pt reports stable energy levels, mild intentional weight loss, sinus headaches, recent pneumonia, and denies nose bleeds, gum bleeds, concerns for bleeding, frequent infections, fevers, chills, night sweats, unexpected weight loss, abdominal pains, blood in the stools, and any other symptoms.   On PMHx the pt reports DM, HTN, Cirrhosis. On Social Hx the pt reports having a shot of vodka twice a month   INTERVAL HISTORY:   Vicki [name] returns today for management and evaluation of her  leukopenia. The patient's last visit with [place] was on 10/15/2018. The pt reports that she is doing well overall.  She mentioned that she has been taking Flaxseed oil and Vitamin C. She has a dental procedure in October 2020.  Of note since the patient's last visit, pt had a bone marrow biopsy ([id number]) completed on 11/29/2018 with results revealing slightly hypercellular marrow with erythroid hyperplasia and mild atypia. Peripheral blood revealed pancytopenia.   Lab results today (12/06/18) of CBC w/diff and CMP is as follows: all values are WNL except for WBC at 1.8, RBC at 3.67, Hemoglobin at 11.3, HCT at 34.4, Platelets at 39, Neutro Abs at 0.9, and Lymphs Abs at 0.6.      MEDICAL HISTORY:  Past Medical History:  Diagnosis Date   Allergy    Boil    Classical migraine with intractable migraine 04/09/2018   Diabetes ([place])    type 2   Elevated liver enzymes    Fatty liver    Hyperlipemia    Hypertension    Palpitations    frequent PVCs on event monitor   PVC's (premature ventricular contractions)     SURGICAL HISTORY: Past Surgical History:  Procedure Laterality Date   COLONOSCOPY  2014   [facility]   UPPER GI ENDOSCOPY  08/2017   [facility]    SOCIAL HISTORY: Social History   Socioeconomic History   Marital status: Single  Spouse name: Not on file   Number of children: 1   Years of education: Not on file   Highest education level: Some college, no degree  Occupational History    Employer: [employer]  Social Designer, fashion/clothing strain: Not on file   Food insecurity    Worry: Not on file    Inability: Not on file   Transportation needs    Medical: Not on file    Non-medical: Not on file  Tobacco Use   Smoking status: Never Smoker   Smokeless tobacco: Never Used  Substance and Sexual Activity   Alcohol use: Yes    Alcohol/week: 3.0 standard drinks    Types: 3 Standard  drinks or equivalent per week    Comment: occasional   Drug use: No   Sexual activity: Yes    Birth control/protection: Condom  Lifestyle   Physical activity    Days per week: Not on file    Minutes per session: Not on file   Stress: Not on file  Relationships   Social connections    Talks on phone: Not on file    Gets together: Not on file    Attends religious service: Not on file    Active member of club or organization: Not on file    Attends meetings of clubs or organizations: Not on file    Relationship status: Not on file   Intimate partner violence    Fear of current or ex partner: Not on file    Emotionally abused: Not on file    Physically abused: Not on file    Forced sexual activity: Not on file  Other Topics Concern   Not on file  Social History Narrative   Epworth Sleepiness Scale = 3 (as of 04/15/2015)   Caffeine 1 cup/month   Lives at home with spouse    Left handed     FAMILY HISTORY: Family History  Problem Relation Age of Onset   Hypertension Mother    Diabetes Mother    Heart attack Mother    Asthma Mother        Childhood   Hypertension Sister    Diabetes Sister    Asthma Sister    Diabetes Brother    Hypertension Brother    Diabetes Brother    Colon cancer Neg Hx     ALLERGIES:  is allergic to sulfa antibiotics and sulfasalazine.  MEDICATIONS:  Current Outpatient Medications  Medication Sig Dispense Refill   acetaminophen (TYLENOL) 500 MG tablet Take 500 mg by mouth as needed for pain.     alum & mag hydroxide-simeth (MAALOX PLUS) 400-400-40 MG/5ML suspension Take by mouth every 6 (six) hours as needed for indigestion.     BENICAR HCT 40-25 MG per tablet Take 0.5 tablets by mouth daily. Half tab daily     BLACK CURRANT SEED OIL PO Take by mouth daily. Pt takes 1-2 tsp liquid daily.     cholecalciferol (VITAMIN D3) 25 MCG (1000 UT) tablet Take 1,000 Units by mouth daily.     Dulaglutide (TRULICITY) 3.87 FI/4.3PI SOPN  Inject into the skin.     fluorometholone (FML) 0.1 % ophthalmic suspension Apply to eye.     NONFORMULARY OR COMPOUNDED ITEM [facility]:  Combination Pain Cream + Ketamine - Baclofen 2%, Doxepin 5%, Gabapentin 6%, Topiramate 2%, Pentoxifylline 3%, Ketamine 10 %, apply 1-2 grams to affected area 3-4 times daily. 120 each 2   NONFORMULARY OR COMPOUNDED ITEM [facility]:  Antifungal cream - Terbinafine 3%, Fluconazole 2%, Tea Tree Oil 5%, Urea 10%, Ibuprofen 2% in #92m suspension. Apply to the affected nail(s) once (at bedtime) or twice daily. 30 each 5   SUMAtriptan (IMITREX) 100 MG tablet Take 1 tablet (100 mg total) by mouth 2 (two) times daily as needed for migraine. May repeat in 2 hours if headache persists or recurs. 10 tablet 3   topiramate (TOPAMAX) 25 MG tablet Take one tablet at night for one week, then take 2 tablets at night for one week, then take 3 tablets at night. 90 tablet 3   Turmeric POWD by Does not apply route. Pt takes 1-2 times weekly.     No current facility-administered medications for this visit.     REVIEW OF SYSTEMS:   A 10+ POINT REVIEW OF SYSTEMS WAS OBTAINED including neurology, dermatology, psychiatry, cardiac, respiratory, lymph, extremities, GI, GU, Musculoskeletal, constitutional, breasts, reproductive, HEENT.  All pertinent positives are noted in the HPI.  All others are negative.     PHYSICAL EXAMINATION: ECOG FS:1 - Symptomatic but completely ambulatory  . There were no vitals filed for this visit. Wt Readings from Last 3 Encounters:  10/15/18 151 lb 9.6 oz (68.8 kg)  05/09/18 163 lb 12.8 oz (74.3 kg)  04/17/18 161 lb 3.2 oz (73.1 kg)   There is no height or weight on file to calculate BMI.    GENERAL:alert, in no acute distress and comfortable SKIN: no acute rashes, no significant lesions EYES: conjunctiva are pink and non-injected, sclera anicteric OROPHARYNX: MMM, no exudates, no oropharyngeal erythema or ulceration NECK:  supple, no JVD LYMPH:  no palpable lymphadenopathy in the cervical, axillary or inguinal regions LUNGS: clear to auscultation b/l with normal respiratory effort HEART: regular rate & rhythm ABDOMEN:  normoactive bowel sounds , non tender, not distended. Extremity: no pedal edema PSYCH: alert & oriented x 3 with fluent speech NEURO: no focal motor/sensory deficits   LABORATORY DATA:  I have reviewed the data as listed  . CBC Latest Ref Rng & Units 11/29/2018 10/15/2018 04/02/2018  WBC 4.0 - 10.5 K/uL 1.8(L) 2.1(L) 2.3(L)  Hemoglobin 12.0 - 15.0 g/dL 11.3(L) 11.0(L) 12.3  Hematocrit 36.0 - 46.0 % 34.4(L) 32.7(L) 36.7  Platelets 150 - 400 K/uL 39(L) 35(L) 104(L)   ANC 1.6k . CBC    Component Value Date/Time   WBC 1.8 (L) 11/29/2018 1005   RBC 3.67 (L) 11/29/2018 1005   HGB 11.3 (L) 11/29/2018 1005   HCT 34.4 (L) 11/29/2018 1005   HCT 38.7 11/27/2017 1418   PLT 39 (L) 11/29/2018 1005   MCV 93.7 11/29/2018 1005   MCH 30.8 11/29/2018 1005   MCHC 32.8 11/29/2018 1005   RDW 13.9 11/29/2018 1005   LYMPHSABS 0.6 (L) 11/29/2018 1005   MONOABS 0.2 11/29/2018 1005   EOSABS 0.1 11/29/2018 1005   BASOSABS 0.0 11/29/2018 1005    . CMP Latest Ref Rng & Units 10/15/2018 04/02/2018 11/27/2017  Glucose 70 - 99 mg/dL 128(H) 168(H) 116(H)  BUN 6 - 20 mg/dL 9 9 7   Creatinine 0.44 - 1.00 mg/dL 0.91 0.94 0.81  Sodium 135 - 145 mmol/L 138 138 141  Potassium 3.5 - 5.1 mmol/L 3.8 4.1 4.0  Chloride 98 - 111 mmol/L 108 104 104  CO2 22 - 32 mmol/L 20(L) 28 29  Calcium 8.9 - 10.3 mg/dL 9.2 9.8 10.0  Total Protein 6.5 - 8.1 g/dL 8.2(H) 8.3(H) 8.5(H)  Total Bilirubin 0.3 - 1.2 mg/dL 1.0 0.9 1.0  Alkaline Phos  38 - 126 U/L 98 107 122  AST 15 - 41 U/L 69(H) 74(H) 162(H)  ALT 0 - 44 U/L 45(H) 71(H) 131(H)   11/29/2018 bone marrow biopsy ([id number])    10/06/17 CBC w/diff:     RADIOGRAPHIC STUDIES: I have personally reviewed the radiological images as listed and agreed with the findings in  the report. Ct Biopsy  Result Date: 11/29/2018 CLINICAL DATA:  Progressive pancytopenia and need for bone marrow biopsy. EXAM: CT GUIDED BONE MARROW ASPIRATION AND BIOPSY ANESTHESIA/SEDATION: Versed 4.0 mg IV, Fentanyl 100 mcg IV Total Moderate Sedation Time:   14 minutes. The patient's level of consciousness and physiologic status were continuously monitored during the procedure by Radiology nursing. PROCEDURE: The procedure risks, benefits, and alternatives were explained to the patient. Questions regarding the procedure were encouraged and answered. The patient understands and consents to the procedure. A time out was performed prior to initiating the procedure. The right gluteal region was prepped with chlorhexidine. Sterile gown and sterile gloves were used for the procedure. Local anesthesia was provided with 1% Lidocaine. Under CT guidance, an 11 gauge On Control bone cutting needle was advanced from a posterior approach into the right iliac bone. Needle positioning was confirmed with CT. Initial non heparinized and heparinized aspirate samples were obtained of bone marrow. Core biopsy was performed via the On Control drill needle. COMPLICATIONS: None FINDINGS: Inspection of initial aspirate did reveal visible particles. Intact core biopsy sample was obtained. IMPRESSION: CT guided bone marrow biopsy of right posterior iliac bone with both aspirate and core samples obtained. Electronically Signed   By: Aletta Edouard M.D.   On: 11/29/2018 12:39   Ct Bone Marrow Biopsy & Aspiration  Result Date: 11/29/2018 CLINICAL DATA:  Progressive pancytopenia and need for bone marrow biopsy. EXAM: CT GUIDED BONE MARROW ASPIRATION AND BIOPSY ANESTHESIA/SEDATION: Versed 4.0 mg IV, Fentanyl 100 mcg IV Total Moderate Sedation Time:   14 minutes. The patient's level of consciousness and physiologic status were continuously monitored during the procedure by Radiology nursing. PROCEDURE: The procedure risks, benefits, and  alternatives were explained to the patient. Questions regarding the procedure were encouraged and answered. The patient understands and consents to the procedure. A time out was performed prior to initiating the procedure. The right gluteal region was prepped with chlorhexidine. Sterile gown and sterile gloves were used for the procedure. Local anesthesia was provided with 1% Lidocaine. Under CT guidance, an 11 gauge On Control bone cutting needle was advanced from a posterior approach into the right iliac bone. Needle positioning was confirmed with CT. Initial non heparinized and heparinized aspirate samples were obtained of bone marrow. Core biopsy was performed via the On Control drill needle. COMPLICATIONS: None FINDINGS: Inspection of initial aspirate did reveal visible particles. Intact core biopsy sample was obtained. IMPRESSION: CT guided bone marrow biopsy of right posterior iliac bone with both aspirate and core samples obtained. Electronically Signed   By: Aletta Edouard M.D.   On: 11/29/2018 12:39   04/18/17 CT Cirrhosis Abdomen     ASSESSMENT & PLAN:   Runette Scifres is a 58 y.o. female with:   1. Leukopenia - lymphopenia Labs upon initial presentation 10/06/17, HGB wnl, WBC at 2.5k, PLT at 109k. Ferritin at 508   2. Thrombocytopenia 04/18/17 CT Cirrhosis Abdomen which revealed perisplenic varices and a splenorenal shunt with concern for portal hypertension.  12/11/17 NM Liver Spleen scan revealed Normal colloid distribution between the liver, spleen and bone marrow, without evidence of colloid shift or  hypersplenism. Questionable space-occupying lesion versus inhomogeneous uptake of tracer in the lateral segment LEFT lobe liver, unable to exclude mass; followup MR imaging with and without contrast recommended to exclude hepatic mass lesion.    04/11/18 CT A/P which revealed No focal hepatic lesion. 2. Prominence of caudate lobe and venous collateralization suggests early cirrhosis and  portal hypertension.  09/26/2018 CT abd w contrast completed at [facility] revealed "Hepatic findings: 1. No suspicious hepatic mass. 2. Findings of portal hypertension including perigastric varices and a splenorenal shunt. 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 Pt can continue to take Vitamin C but discontinue taking flaxseed oil -Monitor symptoms and reschedule appointments in 2 to 3 months  -Advised pt to advise her dentist that she has low platelets before dental procedure. Pt can start using amicar to help with bleeding after procedure. -recommended to maintain follow ups with [facility] for her liver cirrhosis.  FOLLOW UP: May cancel lab and MD visit scheduled for 10/12 RTC with Dr Irene Limbo with labs in 2 months  The total time spent in the appt was 20 minutes and more than 50% was on counseling and direct patient cares.  All of the patient's questions were answered with apparent satisfaction. The patient knows to call the clinic with any problems, questions or concerns.    Sullivan Lone MD MS AAHIVMS [facility] Hematology/Oncology Physician [facility]  (Office):       [phone] (Work cell):  [phone] (Fax):           [phone]  12/06/2018 1:11 AM  I, Jacqualyn Posey, am acting as a Education administrator for Dr. Sullivan Lone.   .I have reviewed the above documentation for accuracy and completeness, and I agree with the above. Brunetta Genera MD

## 2018-12-11 ENCOUNTER — Telehealth: Payer: Self-pay | Admitting: Hematology

## 2018-12-11 NOTE — Telephone Encounter (Signed)
Returned patient's phone call regarding rescheduling an appointment, left a voicemail. 

## 2018-12-12 ENCOUNTER — Telehealth: Payer: Self-pay | Admitting: *Deleted

## 2018-12-12 NOTE — Telephone Encounter (Signed)
Patient called - breaking out in red bumps on chest/legs/arms/back.Experienceing nausea/dizziness/ and feels like her hear is sometimes skipping a beat. She is very fatigued and has missed work a few times.  Dr.Kale given information. He states pt needs to contact her PCP for an evaluation of symptoms. Contacted patient - she verbalized agreement and understanding.

## 2018-12-13 DIAGNOSIS — J3489 Other specified disorders of nose and nasal sinuses: Secondary | ICD-10-CM | POA: Diagnosis not present

## 2018-12-13 DIAGNOSIS — L509 Urticaria, unspecified: Secondary | ICD-10-CM | POA: Diagnosis not present

## 2018-12-13 DIAGNOSIS — F418 Other specified anxiety disorders: Secondary | ICD-10-CM | POA: Diagnosis not present

## 2018-12-13 DIAGNOSIS — R42 Dizziness and giddiness: Secondary | ICD-10-CM | POA: Diagnosis not present

## 2018-12-14 ENCOUNTER — Telehealth: Payer: Self-pay | Admitting: Hematology

## 2018-12-14 NOTE — Telephone Encounter (Signed)
Patient called to reschedule 12/02 appointment, per patient's request appointment moved to 12/09.

## 2018-12-17 ENCOUNTER — Other Ambulatory Visit: Payer: Federal, State, Local not specified - PPO

## 2018-12-17 ENCOUNTER — Ambulatory Visit: Payer: Federal, State, Local not specified - PPO | Admitting: Hematology

## 2018-12-17 ENCOUNTER — Telehealth: Payer: Self-pay | Admitting: *Deleted

## 2018-12-17 NOTE — Telephone Encounter (Signed)
Pt called stating she has appt for tooth extraction at 4 pm today.  Wanted to know if Dr. Irene Limbo had sent in script for medication that would help stop bleeding after procedure.  Pt uses  Product/process development scientist on [address]. Pt's   Phone    [phone].

## 2018-12-18 ENCOUNTER — Other Ambulatory Visit: Payer: Self-pay | Admitting: Hematology

## 2018-12-18 DIAGNOSIS — D696 Thrombocytopenia, unspecified: Secondary | ICD-10-CM

## 2018-12-18 MED ORDER — AMINOCAPROIC ACID SOLUTION 5% (50 MG/ML): 5.0000 mL | ORAL | 1 refills | Status: DC | PRN

## 2018-12-19 ENCOUNTER — Telehealth: Payer: Self-pay | Admitting: *Deleted

## 2018-12-19 ENCOUNTER — Other Ambulatory Visit: Payer: Self-pay | Admitting: Hematology

## 2018-12-19 MED ORDER — AMINOCAPROIC ACID 0.25 GM/ML PO SOLN: 1.5000 g | ORAL | 1 refills | Status: DC | PRN

## 2018-12-19 NOTE — Telephone Encounter (Signed)
Contacted patient to see if any excess bleeding noted from gum following tooth extraction on Monday. She reports seeing blood every time she rinses and spits as directed by dentist. She states he told her to expect for several days. she also states that it is not a lot, but she cannot go to work if still bleeding. Informed her that Dr. Irene Limbo had prescribed medication for her to decrease bleeding and that written Rx available at office - pt requests Rx be called to pharmacy so that she does not have to come to [facility].

## 2018-12-20 ENCOUNTER — Telehealth: Payer: Self-pay | Admitting: *Deleted

## 2018-12-21 ENCOUNTER — Other Ambulatory Visit: Payer: Self-pay | Admitting: Hematology

## 2018-12-21 ENCOUNTER — Other Ambulatory Visit: Payer: Self-pay | Admitting: *Deleted

## 2018-12-21 MED ORDER — TRANEXAMIC ACID 650 MG PO TABS (ORTHO): 650.0000 mg | ORAL_TABLET | Freq: Three times a day (TID) | ORAL | 0 refills | Status: DC

## 2018-12-27 NOTE — Telephone Encounter (Signed)
Opened call in error  ?

## 2019-01-02 DIAGNOSIS — L989 Disorder of the skin and subcutaneous tissue, unspecified: Secondary | ICD-10-CM | POA: Diagnosis not present

## 2019-01-07 ENCOUNTER — Other Ambulatory Visit: Payer: Self-pay | Admitting: Obstetrics & Gynecology

## 2019-01-07 DIAGNOSIS — Z1231 Encounter for screening mammogram for malignant neoplasm of breast: Secondary | ICD-10-CM

## 2019-01-08 ENCOUNTER — Ambulatory Visit: Payer: Federal, State, Local not specified - PPO | Admitting: Family Medicine

## 2019-01-10 ENCOUNTER — Ambulatory Visit: Payer: Federal, State, Local not specified - PPO | Admitting: Family Medicine

## 2019-01-25 ENCOUNTER — Telehealth: Payer: Self-pay | Admitting: Nurse Practitioner

## 2019-01-25 NOTE — Telephone Encounter (Signed)
I placed call to pt to see how she has been feeling since office visit in March.   LEA dopplers were also ordered at that time. Left message to call office.

## 2019-01-25 NOTE — Telephone Encounter (Signed)
Patient had an order for an ETT put in by Truitt Merle back in March. She is calling to schedule that appt but the order has expired. Patient needs a new order put in.

## 2019-01-28 ENCOUNTER — Ambulatory Visit: Payer: Federal, State, Local not specified - PPO | Admitting: Podiatry

## 2019-01-28 NOTE — Telephone Encounter (Signed)
Ok to reorder if she wishes to proceed - but will have to have COVID testing.   Vicki [name]

## 2019-01-28 NOTE — Telephone Encounter (Signed)
Left message to call office

## 2019-01-29 ENCOUNTER — Other Ambulatory Visit: Payer: Self-pay

## 2019-01-29 ENCOUNTER — Ambulatory Visit
Admission: EM | Admit: 2019-01-29 | Discharge: 2019-01-29 | Disposition: A | Discharge status: Home / Self Care | Payer: Federal, State, Local not specified - PPO | Attending: Physician Assistant | Admitting: Physician Assistant

## 2019-01-29 DIAGNOSIS — R05 Cough: Secondary | ICD-10-CM

## 2019-01-29 DIAGNOSIS — Z20828 Contact with and (suspected) exposure to other viral communicable diseases: Secondary | ICD-10-CM

## 2019-01-29 DIAGNOSIS — L02214 Cutaneous abscess of groin: Secondary | ICD-10-CM | POA: Diagnosis not present

## 2019-01-29 DIAGNOSIS — R509 Fever, unspecified: Secondary | ICD-10-CM

## 2019-01-29 DIAGNOSIS — R059 Cough, unspecified: Secondary | ICD-10-CM

## 2019-01-29 MED ORDER — MUPIROCIN 2 % EX OINT: 1.0000 "application " | TOPICAL_OINTMENT | Freq: Two times a day (BID) | CUTANEOUS | 0 refills | Status: DC

## 2019-01-29 MED ORDER — AMOXICILLIN-POT CLAVULANATE 875-125 MG PO TABS: 1.0000 | ORAL_TABLET | Freq: Two times a day (BID) | ORAL | 0 refills | Status: DC

## 2019-01-29 NOTE — ED Provider Notes (Signed)
[facility]    CSN: [id number] Arrival date & time: 01/29/19  [place]      History   Chief Complaint Chief Complaint  Patient presents with  . Abscess    HPI Zafirah Caisse is a 58 y.o. female.   58 year old female comes in for 1 week history of multiple abscess to the groin area.  She has 1 to the right groin area that started 1 week ago, increasing in size, becoming more tender.  States has 1 abscess to the left groin that started 5 days ago, no pain, no increasing in size.  She felt chills today and developed a temp of 100.5.  She has been doing warm compresses with minimal relief.  Patient has also noticed 2 days of cough.  Denies shortness of breath, chest pain, loss of taste or smell.  Denies abdominal pain, nausea, vomiting, diarrhea.  Has had mild rhinorrhea, nasal congestion.  No known sick/Covid contact.     Past Medical History:  Diagnosis Date  . Allergy   . Boil   . 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 Diabetes ([place])    type 2  . Elevated liver enzymes   . Fatty liver   . Hyperlipemia   . Hypertension   . 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 PVC's (premature ventricular contractions)     Patient Active Problem List   Diagnosis Date Noted  . 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 Palpitations 04/15/2015  . Controlled type 2 diabetes mellitus without complication ([place]) [id number]  . Elevated liver enzymes 03/31/2014  . Tinea pedis 07/31/2013  . Foot and toe(s), blister, infected 07/31/2013  . 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 Pain in joint, ankle and foot 08/31/2012    Past Surgical History:  Procedure Laterality Date  . COLONOSCOPY  2014  . ECTOPIC PREGNANCY SURGERY  1990  . TONSILLECTOMY  1980  . UPPER GI ENDOSCOPY  08/2017   [facility]    OB History    Gravida  1   Para  0   Term  0   Preterm  0   AB  1   Living  1     SAB  0   TAB  0   Ectopic  1   Multiple  0   Live Births  0            Home Medications    Prior to Admission medications   Medication Sig Start Date End Date Taking? Authorizing Provider  acetaminophen (TYLENOL) 500 MG tablet Take 500 mg by mouth as needed for pain.    [provider]  alum & mag hydroxide-simeth (MAALOX PLUS) 400-400-40 MG/5ML suspension Take by mouth every 6 (six) hours as needed for indigestion.    [provider]  amoxicillin-clavulanate (AUGMENTIN) 875-125 MG tablet Take 1 tablet by mouth every 12 (twelve) hours. 01/29/19   Tasia Catchings, Dariya Gainer V, PA-C  BENICAR HCT 40-25 MG per tablet Take 0.5 tablets by mouth daily. Half tab daily 09/02/12   [provider]  cholecalciferol (VITAMIN D3) 25 MCG (1000 UT) tablet Take 1,000 Units by mouth daily.    [provider]  Dulaglutide (TRULICITY) A999333 0000000 SOPN Inject into the skin.    [provider]  fluorometholone (FML) 0.1 % ophthalmic suspension Apply to eye.    [provider]  mupirocin ointment (BACTROBAN) 2 % Apply 1 application topically 2 (two) times daily. 01/29/19  Ok Edwards, PA-C  NONFORMULARY OR COMPOUNDED ITEM [facility]:  Combination Pain Cream + Ketamine - Baclofen 2%, Doxepin 5%, Gabapentin 6%, Topiramate 2%, Pentoxifylline 3%, Ketamine 10 %, apply 1-2 grams to affected area 3-4 times daily. 08/29/17   Landis Martins, DPM  NONFORMULARY OR COMPOUNDED ITEM [facility]:  Antifungal cream - Terbinafine 3%, Fluconazole 2%, Tea Tree Oil 5%, Urea 10%, Ibuprofen 2% in #18ml suspension. Apply to the affected nail(s) once (at bedtime) or twice daily. 05/09/18   Landis Martins, DPM  SUMAtriptan (IMITREX) 100 MG tablet Take 1 tablet (100 mg total) by mouth 2 (two) times daily as needed for migraine. May repeat in 2 hours if headache persists or recurs. 04/09/18   Kathrynn Ducking, MD  topiramate (TOPAMAX) 25 MG tablet Take one tablet at night for one week, then take 2 tablets at  night for one week, then take 3 tablets at night. 04/09/18   Kathrynn Ducking, MD  tranexamic acid (LYSTEDA) 650 mg TABS tablet Take 1 tablet (650 mg total) by mouth 3 (three) times daily. 12/21/18   Brunetta Genera, MD  Turmeric POWD by Does not apply route. Pt takes 1-2 times weekly.    [provider]    Family History Family History  Problem Relation Age of Onset  . Hypertension Mother   . Diabetes Mother   . Heart attack Mother   . Asthma Mother        Childhood  . Hypertension Sister   . Diabetes Sister   . Asthma Sister   . Diabetes Brother   . Hypertension Brother   . Diabetes Brother   . Colon cancer Neg Hx     Social History Social History   Tobacco Use  . Smoking status: Never Smoker  . Smokeless tobacco: Never Used  Substance Use Topics  . Alcohol use: Not Currently    Alcohol/week: 3.0 standard drinks    Types: 3 Standard drinks or equivalent per week    Comment: occasional  . Drug use: No     Allergies   Sulfa antibiotics and Sulfasalazine   Review of Systems Review of Systems  Reason unable to perform ROS: See HPI as above.     Physical Exam Triage Vital Signs ED Triage Vitals [01/29/19 1846]  Enc Vitals Group     BP (!) 172/82     Pulse Rate 95     Resp 18     Temp 99.4 F (37.4 C)     Temp Source Oral     SpO2 98 %     Weight      Height      Head Circumference      Peak Flow      Pain Score 3     Pain Loc      Pain Edu?      Excl. in [place]?    No data found.  Updated Vital Signs BP (!) 172/82 (BP Location: Left Arm)   Pulse 95   Temp 99.4 F (37.4 C) (Oral)   Resp 18   LMP 04/30/2012   SpO2 98%   Physical Exam Constitutional:      General: She is not in acute distress.    Appearance: Normal appearance. She is not ill-appearing, toxic-appearing or diaphoretic.  HENT:     Head: Normocephalic and atraumatic.     Mouth/Throat:     Mouth: Mucous membranes are moist.     Pharynx: Oropharynx is clear. Uvula  midline.  Neck:     Musculoskeletal: Normal range of motion and neck supple.  Cardiovascular:     Rate and Rhythm: Normal rate and regular rhythm.     Heart sounds: Normal heart sounds. No murmur. No friction rub. No gallop.   Pulmonary:     Effort: Pulmonary effort is normal. No accessory muscle usage, prolonged expiration, respiratory distress or retractions.     Comments: Lungs clear to auscultation without adventitious lung sounds. Genitourinary:    Comments: 1cm x 1cm round superficial abscess to the right groin area. Surrounding cellulitis noted.  Neurological:     General: No focal deficit present.     Mental Status: She is alert and oriented to person, place, and time.      UC Treatments / Results  Labs (all labs ordered are listed, but only abnormal results are displayed) Labs Reviewed  NOVEL CORONAVIRUS, NAA    EKG   Radiology No results found.  Procedures Incision and Drainage  Date/Time: 01/29/2019 8:11 PM Performed by: Ok Edwards, PA-C Authorized by: Ok Edwards, PA-C   Consent:    Consent obtained:  Verbal   Consent given by:  Patient   Risks discussed:  Bleeding, incomplete drainage, infection, pain and damage to other organs   Alternatives discussed:  Alternative treatment and referral Location:    Type:  Abscess   Size:  1cm x 1cm   Location:  Lower extremity   Lower extremity location: right interior thigh. Pre-procedure details:    Skin preparation:  Chloraprep Anesthesia (see MAR for exact dosages):    Anesthesia method:  None Procedure type:    Complexity:  Simple Procedure details:    Needle aspiration: no     Incision types:  Single straight   Incision depth:  Dermal   Scalpel blade:  11   Wound management:  Irrigated with saline   Drainage:  Purulent   Drainage amount:  Moderate   Wound treatment:  Wound left open   Packing materials:  None Post-procedure details:    Patient tolerance of procedure:  Tolerated well, no immediate  complications   (including critical care time)  Medications Ordered in UC Medications - No data to display  Initial Impression / Assessment and Plan / UC Course  I have reviewed the triage vital signs and the nursing notes.  Pertinent labs & imaging results that were available during my care of the patient were reviewed by me and considered in my medical decision making (see chart for details).    1. Abscess Patient tolerated procedure well. Start augmentin for surrounding cellulitis. Wound care instructions given. Return precautions given. Patient expresses understanding and agrees to plan.   2. Cough Discussed fever could be due to abscess.  However, given patient also with cough, discussed cannot rule out Covid causing symptoms.  Covid testing ordered.  Patient to remain in quarantine until testing results return.  Return precautions given.  Patient expresses understanding and agrees to plan.  Final Clinical Impressions(s) / UC Diagnoses   Final diagnoses:  Fever, unspecified  Cough  Abscess of groin   ED Prescriptions    Medication Sig Dispense Auth. Provider   amoxicillin-clavulanate (AUGMENTIN) 875-125 MG tablet Take 1 tablet by mouth every 12 (twelve) hours. 14 tablet Ailis Rigaud V, PA-C   mupirocin ointment (BACTROBAN) 2 % Apply 1 application topically 2 (two) times daily. 22 g Ok Edwards, PA-C     PDMP not reviewed this encounter.   Ok Edwards, PA-C 01/29/19  2012  

## 2019-01-29 NOTE — Discharge Instructions (Addendum)
Fever and cough As discussed, cannot rule out COVID. COVID testing ordered. I would like you to quarantine until testing results. You can take over the counter flonase/nasacort to help with nasal congestion/drainage. If experiencing shortness of breath, trouble breathing, go to the emergency department for further evaluation needed.   Abscess Start Augmentin as directed. You can remove current dressing in 24 hours. Keep wound clean and dry. You can clean gently with soap and water. Do not soak area in water. Monitor for spreading redness, increased warmth, increased swelling, fever, follow up for reevaluation needed.

## 2019-01-29 NOTE — ED Triage Notes (Signed)
Pt c/o abscess to vaginal/inner thigh area x5 days. States developed a fever today of 100.5

## 2019-02-01 LAB — NOVEL CORONAVIRUS, NAA: SARS-CoV-2, NAA: NOT DETECTED

## 2019-02-02 ENCOUNTER — Telehealth (HOSPITAL_COMMUNITY): Payer: Self-pay | Admitting: Emergency Medicine

## 2019-02-02 NOTE — Telephone Encounter (Signed)
Pt left vm reqesting test results. Called and left vm to return call.

## 2019-02-04 NOTE — Telephone Encounter (Signed)
Left message on home and cell numbers to call office 

## 2019-02-05 DIAGNOSIS — E878 Other disorders of electrolyte and fluid balance, not elsewhere classified: Secondary | ICD-10-CM | POA: Diagnosis not present

## 2019-02-05 NOTE — Telephone Encounter (Signed)
Noted.   Vicki [name] 

## 2019-02-05 NOTE — Telephone Encounter (Signed)
This gxt was ordered in March.  Will send to [place] to [place].

## 2019-02-05 NOTE — Telephone Encounter (Signed)
I spoke to the patient who is not interested in the GXT at this time because of being CoVid tested prior to test.    She will call us when she is ready to proceed, thinking criteria may change.   She said that she is feeling fine presently.

## 2019-02-06 ENCOUNTER — Other Ambulatory Visit: Payer: Federal, State, Local not specified - PPO

## 2019-02-06 ENCOUNTER — Ambulatory Visit: Payer: Federal, State, Local not specified - PPO | Admitting: Hematology

## 2019-02-11 DIAGNOSIS — H15112 Episcleritis periodica fugax, left eye: Secondary | ICD-10-CM | POA: Diagnosis not present

## 2019-02-13 ENCOUNTER — Telehealth: Payer: Self-pay | Admitting: Hematology

## 2019-02-13 ENCOUNTER — Inpatient Hospital Stay: Payer: Federal, State, Local not specified - PPO | Attending: Hematology

## 2019-02-13 ENCOUNTER — Inpatient Hospital Stay: Payer: Federal, State, Local not specified - PPO | Admitting: Hematology

## 2019-02-13 ENCOUNTER — Other Ambulatory Visit: Payer: Self-pay

## 2019-02-13 ENCOUNTER — Encounter: Payer: Self-pay | Admitting: *Deleted

## 2019-02-13 VITALS — BP 144/74 | HR 82 | Temp 98.2°F | Resp 20 | Ht 62.0 in | Wt 153.2 lb

## 2019-02-13 DIAGNOSIS — I10 Essential (primary) hypertension: Secondary | ICD-10-CM | POA: Diagnosis not present

## 2019-02-13 DIAGNOSIS — E119 Type 2 diabetes mellitus without complications: Secondary | ICD-10-CM | POA: Diagnosis not present

## 2019-02-13 DIAGNOSIS — D696 Thrombocytopenia, unspecified: Secondary | ICD-10-CM

## 2019-02-13 DIAGNOSIS — D61818 Other pancytopenia: Secondary | ICD-10-CM

## 2019-02-13 DIAGNOSIS — D72819 Decreased white blood cell count, unspecified: Secondary | ICD-10-CM | POA: Insufficient documentation

## 2019-02-13 DIAGNOSIS — K7460 Unspecified cirrhosis of liver: Secondary | ICD-10-CM | POA: Diagnosis not present

## 2019-02-13 DIAGNOSIS — I868 Varicose veins of other specified sites: Secondary | ICD-10-CM | POA: Diagnosis not present

## 2019-02-13 DIAGNOSIS — D732 Chronic congestive splenomegaly: Secondary | ICD-10-CM | POA: Diagnosis not present

## 2019-02-13 LAB — CMP (CANCER CENTER ONLY)
ALT: 40 U/L (ref 0–44)
AST: 56 U/L — ABNORMAL HIGH (ref 15–41)
Albumin: 3.8 g/dL (ref 3.5–5.0)
Alkaline Phosphatase: 108 U/L (ref 38–126)
Anion gap: 8 (ref 5–15)
BUN: 7 mg/dL (ref 6–20)
CO2: 26 mmol/L (ref 22–32)
Calcium: 9 mg/dL (ref 8.9–10.3)
Chloride: 106 mmol/L (ref 98–111)
Creatinine: 0.95 mg/dL (ref 0.44–1.00)
GFR, Est AFR Am: >60 mL/min
GFR, Estimated: >60 mL/min
Glucose, Bld: 143 mg/dL — ABNORMAL HIGH (ref 70–99)
Potassium: 3.5 mmol/L (ref 3.5–5.1)
Sodium: 140 mmol/L (ref 135–145)
Total Bilirubin: 1 mg/dL (ref 0.3–1.2)
Total Protein: 8.4 g/dL — ABNORMAL HIGH (ref 6.5–8.1)

## 2019-02-13 LAB — CBC WITH DIFFERENTIAL/PLATELET
Abs Immature Granulocytes: 0 K/uL (ref 0.00–0.07)
Basophils Absolute: 0 K/uL (ref 0.0–0.1)
Basophils Relative: 1 %
Eosinophils Absolute: 0 K/uL (ref 0.0–0.5)
Eosinophils Relative: 3 %
HCT: 30.9 % — ABNORMAL LOW (ref 36.0–46.0)
Hemoglobin: 10.5 g/dL — ABNORMAL LOW (ref 12.0–15.0)
Immature Granulocytes: 0 %
Lymphocytes Relative: 42 %
Lymphs Abs: 0.6 K/uL — ABNORMAL LOW (ref 0.7–4.0)
MCH: 30.8 pg (ref 26.0–34.0)
MCHC: 34 g/dL (ref 30.0–36.0)
MCV: 90.6 fL (ref 80.0–100.0)
Monocytes Absolute: 0.1 K/uL (ref 0.1–1.0)
Monocytes Relative: 8 %
Neutro Abs: 0.7 K/uL — ABNORMAL LOW (ref 1.7–7.7)
Neutrophils Relative %: 46 %
Platelets: 34 K/uL — ABNORMAL LOW (ref 150–400)
RBC: 3.41 MIL/uL — ABNORMAL LOW (ref 3.87–5.11)
RDW: 13.3 % (ref 11.5–15.5)
WBC: 1.5 K/uL — ABNORMAL LOW (ref 4.0–10.5)
nRBC: 0 % (ref 0.0–0.2)

## 2019-02-13 LAB — IMMATURE PLATELET FRACTION: Immature Platelet Fraction: 6 % (ref 1.2–8.6)

## 2019-02-13 LAB — FIBRINOGEN: Fibrinogen: 276 mg/dL (ref 210–475)

## 2019-02-13 LAB — VITAMIN B12: Vitamin B-12: 426 pg/mL (ref 180–914)

## 2019-02-13 LAB — PROTIME-INR
INR: 1.2 (ref 0.8–1.2)
Prothrombin Time: 14.9 s (ref 11.4–15.2)

## 2019-02-13 LAB — FERRITIN: Ferritin: 339 ng/mL — ABNORMAL HIGH (ref 11–307)
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
## 2019-02-13 NOTE — Telephone Encounter (Signed)
Scheduled appt per 12/09 los - gave patient AVS

## 2019-02-15 LAB — FOLATE RBC
Folate, Hemolysate: 287 ng/mL
Folate, RBC: 917 ng/mL (ref >498)
Hematocrit: 31.3 % — ABNORMAL LOW (ref 34.0–46.6)

## 2019-02-16 LAB — COPPER, SERUM: Copper: 153 ug/dL (ref 72–166)

## 2019-02-20 DIAGNOSIS — H04123 Dry eye syndrome of bilateral lacrimal glands: Secondary | ICD-10-CM | POA: Diagnosis not present

## 2019-02-20 DIAGNOSIS — H15112 Episcleritis periodica fugax, left eye: Secondary | ICD-10-CM | POA: Diagnosis not present

## 2019-02-25 ENCOUNTER — Encounter: Payer: Self-pay | Admitting: Family Medicine

## 2019-02-25 ENCOUNTER — Ambulatory Visit: Payer: Federal, State, Local not specified - PPO | Admitting: Family Medicine

## 2019-02-26 ENCOUNTER — Ambulatory Visit: Payer: Federal, State, Local not specified - PPO

## 2019-02-26 ENCOUNTER — Ambulatory Visit (INDEPENDENT_AMBULATORY_CARE_PROVIDER_SITE_OTHER): Payer: Federal, State, Local not specified - PPO | Admitting: Family Medicine

## 2019-02-26 ENCOUNTER — Other Ambulatory Visit: Payer: Self-pay

## 2019-02-26 ENCOUNTER — Encounter: Payer: Self-pay | Admitting: Family Medicine

## 2019-02-26 ENCOUNTER — Telehealth: Payer: Self-pay | Admitting: *Deleted

## 2019-02-26 ENCOUNTER — Telehealth: Payer: Self-pay

## 2019-02-26 VITALS — BP 134/72 | HR 88 | Temp 98.6°F | Resp 18 | Ht 61.0 in | Wt 151.0 lb

## 2019-02-26 DIAGNOSIS — E119 Type 2 diabetes mellitus without complications: Secondary | ICD-10-CM | POA: Diagnosis not present

## 2019-02-26 DIAGNOSIS — G4700 Insomnia, unspecified: Secondary | ICD-10-CM | POA: Diagnosis not present

## 2019-02-26 DIAGNOSIS — K7460 Unspecified cirrhosis of liver: Secondary | ICD-10-CM

## 2019-02-26 DIAGNOSIS — K7581 Nonalcoholic steatohepatitis (NASH): Secondary | ICD-10-CM

## 2019-02-26 LAB — POCT GLYCOSYLATED HEMOGLOBIN (HGB A1C): Hemoglobin A1C: 6.1 % — AB (ref 4.0–5.6)

## 2019-02-26 NOTE — Telephone Encounter (Signed)
Left message for patient to call back about lab result

## 2019-02-26 NOTE — Assessment & Plan Note (Signed)
Stable. Follows with GI, no medication currently

## 2019-02-26 NOTE — Telephone Encounter (Addendum)
Patient called and requested results of most recent lab test. Dr.Kale informed of patient request.  Per Dr. Irene Limbo - he reviewed most recent tests (12/9) with patient. He asked that she be contacted to find out which specific test she had questions about. Contacted patient at number provided - LVM with Dr.Kale comment and requested she contact office at her convenience.

## 2019-02-26 NOTE — Assessment & Plan Note (Signed)
Hgb A1c 6.1 even with some elevated CBG. Will continue to monitor. Repeat in 6 months. Diet controlled.

## 2019-02-26 NOTE — Patient Instructions (Signed)
Great to meet you!  Get blood work. We could start the following if your hemoglobin a1c is elevated   Sitagliptin oral tablet What is this medicine? SITAGLIPTIN (sit a GLIP tin) helps to treat type 2 diabetes. It helps to control blood sugar. Treatment is combined with diet and exercise. This medicine may be used for other purposes; ask your health care provider or pharmacist if you have questions. COMMON BRAND NAME(S): Januvia What should I tell my health care provider before I take this medicine? They need to know if you have any of these conditions:  diabetic ketoacidosis  kidney disease  pancreatitis  previous swelling of the tongue, face, or lips with difficulty breathing, difficulty swallowing, hoarseness, or tightening of the throat  type 1 diabetes  an unusual or allergic reaction to sitagliptin, other medicines, foods, dyes, or preservatives  pregnant or trying to get pregnant  breast-feeding How should I use this medicine? Take this medicine by mouth with a glass of water. Follow the directions on the prescription label. You can take it with or without food. Do not cut, crush or chew this medicine. Take your dose at the same time each day. Do not take more often than directed. Do not stop taking except on your doctor's advice. A special MedGuide will be given to you by the pharmacist with each prescription and refill. Be sure to read this information carefully each time. Talk to your pediatrician regarding the use of this medicine in children. Special care may be needed. Overdosage: If you think you have taken too much of this medicine contact a poison control center or emergency room at once. NOTE: This medicine is only for you. Do not share this medicine with others. What if I miss a dose? If you miss a dose, take it as soon as you can. If it is almost time for your next dose, take only that dose. Do not take double or extra doses. What may interact with this  medicine? Do not take this medicine with any of the following medications:  gatifloxacin This medicine may also interact with the following medications:  alcohol  digoxin  insulin  sulfonylureas like glimepiride, glipizide, glyburide This list may not describe all possible interactions. Give your health care provider a list of all the medicines, herbs, non-prescription drugs, or dietary supplements you use. Also tell them if you smoke, drink alcohol, or use illegal drugs. Some items may interact with your medicine. What should I watch for while using this medicine? Visit your doctor or health care professional for regular checks on your progress. A test called the HbA1C (A1C) will be monitored. This is a simple blood test. It measures your blood sugar control over the last 2 to 3 months. You will receive this test every 3 to 6 months. Learn how to check your blood sugar. Learn the symptoms of low and high blood sugar and how to manage them. Always carry a quick-source of sugar with you in case you have symptoms of low blood sugar. Examples include hard sugar candy or glucose tablets. Make sure others know that you can choke if you eat or drink when you develop serious symptoms of low blood sugar, such as seizures or unconsciousness. They must get medical help at once. Tell your doctor or health care professional if you have high blood sugar. You might need to change the dose of your medicine. If you are sick or exercising more than usual, you might need to change the dose  of your medicine. Do not skip meals. Ask your doctor or health care professional if you should avoid alcohol. Many nonprescription cough and cold products contain sugar or alcohol. These can affect blood sugar. Wear a medical ID bracelet or chain, and carry a card that describes your disease and details of your medicine and dosage times. What side effects may I notice from receiving this medicine? Side effects that you should  report to your doctor or health care professional as soon as possible:  allergic reactions like skin rash, itching or hives, swelling of the face, lips, or tongue  breathing problems  general ill feeling or flu-like symptoms  joint pain  loss of appetite  redness, blistering, peeling or loosening of the skin, including inside the mouth  signs and symptoms of heart failure like breathing problems, fast, irregular heartbeat, sudden weight gain; swelling of the ankles, feet, hands; unusually weak or tired  signs and symptoms of low blood sugar such as feeling anxious, confusion, dizziness, increased hunger, unusually weak or tired, sweating, shakiness, cold, irritable, headache, blurred vision, fast heartbeat, loss of consciousness  signs and symptoms of muscle injury like dark urine; trouble passing urine or change in the amount of urine; unusually weak or tired; muscle pain; back pain  unusual stomach upset or pain  vomiting Side effects that usually do not require medical attention (report to your doctor or health care professional if they continue or are bothersome):  diarrhea  headache  sore throat  stomach upset  stuffy or runny nose This list may not describe all possible side effects. Call your doctor for medical advice about side effects. You may report side effects to FDA at 1-800-FDA-1088. Where should I keep my medicine? Keep out of the reach of children. Store at room temperature between 15 and 30 degrees C (59 and 86 degrees F). Throw away any unused medicine after the expiration date. NOTE: This sheet is a summary. It may not cover all possible information. If you have questions about this medicine, talk to your doctor, pharmacist, or health care provider.  2020 Elsevier/Gold Standard (2017-09-26 16:38:43)

## 2019-02-26 NOTE — Progress Notes (Signed)
Subjective:     Vicki [name] is a 58 y.o. female presenting for Establish Care (previous PCP with [facility] [name]), Hypertension, and Diabetes     HPI  #HTN - has a monitor at home - 120/78 -- well controlled - no cp, sob, - endorses some chronic HA  #Diabetes - am CBG 211, 170 - Fasting CBG has been going up steadily - used to be near 100, but has been going up - has noticed her urine is brownish/orange color - increased urination - was on trulicity - stopped 1 year ago - was told she was allergic to metformin - had lost a lot of weight leading up to stopping medication - has gained some weight - had itching  Lab Results  Component Value Date   HGBA1C 6.1 (A) 02/26/2019   July 2020 = 7.1  #pancytopenia - unclear diagnosis - no cancer - monitoring currently  - not on medication  Review of Systems See HPI  Social History   Tobacco Use  Smoking Status Never Smoker  Smokeless Tobacco Never Used        Objective:    BP Readings from Last 3 Encounters:  02/26/19 134/72  02/13/19 (!) 144/74  01/29/19 (!) 172/82   Wt Readings from Last 3 Encounters:  02/26/19 151 lb (68.5 kg)  02/13/19 153 lb 3.2 oz (69.5 kg)  10/15/18 151 lb 9.6 oz (68.8 kg)    BP 134/72   Pulse 88   Temp 98.6 F (37 C)   Resp 18   Ht 5' 1"  (1.549 m)   Wt 151 lb (68.5 kg)   LMP 04/30/2012   SpO2 98%   BMI 28.53 kg/m    Physical Exam Constitutional:      General: She is not in acute distress.    Appearance: She is well-developed. She is not diaphoretic.  HENT:     Right Ear: External ear normal.     Left Ear: External ear normal.  Eyes:     Conjunctiva/sclera: Conjunctivae normal.  Cardiovascular:     Rate and Rhythm: Normal rate and regular rhythm.     Heart sounds: No murmur.  Pulmonary:     Effort: Pulmonary effort is normal. No respiratory distress.     Breath sounds: Normal breath sounds. No wheezing.  Musculoskeletal:   Cervical back: Neck supple.  Skin:    General: Skin is warm and dry.     Capillary Refill: Capillary refill takes less than 2 seconds.  Neurological:     Mental Status: She is alert. Mental status is at baseline.  Psychiatric:        Mood and Affect: Mood normal.        Behavior: Behavior normal.     Lab Results  Component Value Date   HGBA1C 6.1 (A) 02/26/2019         Assessment & Plan:   Problem List Items Addressed This Visit      Digestive   Liver cirrhosis secondary to NASH ([place]) - Primary    Stable. Follows with GI, no medication currently        Endocrine   Controlled type 2 diabetes mellitus without complication (HCC)    Hgb A1c 6.1 even with some elevated CBG. Will continue to monitor. Repeat in 6 months. Diet controlled.       Relevant Orders   HgB A1c (Completed)   Urine Microalbumin w/creat. ratio     Other   Insomnia    Night  shift, controlled on trazodone          Return in about 3 months (around 05/27/2019).  Lesleigh Noe, MD

## 2019-02-26 NOTE — Assessment & Plan Note (Signed)
Night shift, controlled on trazodone

## 2019-02-27 LAB — MICROALBUMIN / CREATININE URINE RATIO
Creatinine,U: 93 mg/dL
Microalb Creat Ratio: 2.3 mg/g (ref 0.0–30.0)
Microalb, Ur: 2.2 mg/dL — ABNORMAL HIGH (ref 0.0–1.9)

## 2019-03-03 ENCOUNTER — Inpatient Hospital Stay (HOSPITAL_COMMUNITY)
Admission: EM | Admit: 2019-03-03 | Discharge: 2019-03-15 | DRG: 546 | Disposition: A | Discharge status: Home / Self Care | Payer: Federal, State, Local not specified - PPO | Attending: Internal Medicine | Admitting: Internal Medicine

## 2019-03-03 ENCOUNTER — Emergency Department (HOSPITAL_COMMUNITY): Payer: Federal, State, Local not specified - PPO

## 2019-03-03 ENCOUNTER — Encounter (HOSPITAL_COMMUNITY): Payer: Self-pay | Admitting: Emergency Medicine

## 2019-03-03 ENCOUNTER — Encounter (HOSPITAL_COMMUNITY): Payer: Self-pay | Admitting: *Deleted

## 2019-03-03 ENCOUNTER — Other Ambulatory Visit: Payer: Self-pay

## 2019-03-03 ENCOUNTER — Ambulatory Visit (HOSPITAL_COMMUNITY)
Admission: EM | Admit: 2019-03-03 | Discharge: 2019-03-03 | Disposition: A | Discharge status: Home / Self Care | Payer: Federal, State, Local not specified - PPO | Attending: Internal Medicine | Admitting: Internal Medicine

## 2019-03-03 DIAGNOSIS — E119 Type 2 diabetes mellitus without complications: Secondary | ICD-10-CM | POA: Diagnosis not present

## 2019-03-03 DIAGNOSIS — Z825 Family history of asthma and other chronic lower respiratory diseases: Secondary | ICD-10-CM

## 2019-03-03 DIAGNOSIS — Z20822 Contact with and (suspected) exposure to covid-19: Secondary | ICD-10-CM | POA: Diagnosis present

## 2019-03-03 DIAGNOSIS — L0292 Furuncle, unspecified: Secondary | ICD-10-CM | POA: Diagnosis present

## 2019-03-03 DIAGNOSIS — Z882 Allergy status to sulfonamides status: Secondary | ICD-10-CM

## 2019-03-03 DIAGNOSIS — M311 Thrombotic microangiopathy: Secondary | ICD-10-CM | POA: Diagnosis not present

## 2019-03-03 DIAGNOSIS — I10 Essential (primary) hypertension: Secondary | ICD-10-CM | POA: Diagnosis present

## 2019-03-03 DIAGNOSIS — Y92230 Patient room in hospital as the place of occurrence of the external cause: Secondary | ICD-10-CM | POA: Diagnosis not present

## 2019-03-03 DIAGNOSIS — R6889 Other general symptoms and signs: Secondary | ICD-10-CM

## 2019-03-03 DIAGNOSIS — K7460 Unspecified cirrhosis of liver: Secondary | ICD-10-CM | POA: Diagnosis present

## 2019-03-03 DIAGNOSIS — E118 Type 2 diabetes mellitus with unspecified complications: Secondary | ICD-10-CM

## 2019-03-03 DIAGNOSIS — J111 Influenza due to unidentified influenza virus with other respiratory manifestations: Secondary | ICD-10-CM | POA: Diagnosis not present

## 2019-03-03 DIAGNOSIS — T380X5A Adverse effect of glucocorticoids and synthetic analogues, initial encounter: Secondary | ICD-10-CM | POA: Diagnosis not present

## 2019-03-03 DIAGNOSIS — R233 Spontaneous ecchymoses: Secondary | ICD-10-CM | POA: Diagnosis present

## 2019-03-03 DIAGNOSIS — Z888 Allergy status to other drugs, medicaments and biological substances status: Secondary | ICD-10-CM

## 2019-03-03 DIAGNOSIS — D61818 Other pancytopenia: Secondary | ICD-10-CM | POA: Diagnosis not present

## 2019-03-03 DIAGNOSIS — R0602 Shortness of breath: Secondary | ICD-10-CM | POA: Diagnosis not present

## 2019-03-03 DIAGNOSIS — K7581 Nonalcoholic steatohepatitis (NASH): Secondary | ICD-10-CM | POA: Diagnosis not present

## 2019-03-03 DIAGNOSIS — E1165 Type 2 diabetes mellitus with hyperglycemia: Secondary | ICD-10-CM | POA: Diagnosis not present

## 2019-03-03 DIAGNOSIS — L299 Pruritus, unspecified: Secondary | ICD-10-CM | POA: Diagnosis not present

## 2019-03-03 DIAGNOSIS — R509 Fever, unspecified: Secondary | ICD-10-CM

## 2019-03-03 DIAGNOSIS — R21 Rash and other nonspecific skin eruption: Secondary | ICD-10-CM | POA: Diagnosis not present

## 2019-03-03 DIAGNOSIS — M3119 Other thrombotic microangiopathy: Secondary | ICD-10-CM

## 2019-03-03 DIAGNOSIS — M791 Myalgia, unspecified site: Secondary | ICD-10-CM | POA: Diagnosis not present

## 2019-03-03 DIAGNOSIS — T458X5A Adverse effect of other primarily systemic and hematological agents, initial encounter: Secondary | ICD-10-CM | POA: Diagnosis not present

## 2019-03-03 DIAGNOSIS — K7689 Other specified diseases of liver: Secondary | ICD-10-CM | POA: Diagnosis not present

## 2019-03-03 DIAGNOSIS — R600 Localized edema: Secondary | ICD-10-CM | POA: Diagnosis not present

## 2019-03-03 DIAGNOSIS — Z833 Family history of diabetes mellitus: Secondary | ICD-10-CM

## 2019-03-03 DIAGNOSIS — L02214 Cutaneous abscess of groin: Secondary | ICD-10-CM | POA: Diagnosis present

## 2019-03-03 DIAGNOSIS — D696 Thrombocytopenia, unspecified: Secondary | ICD-10-CM

## 2019-03-03 DIAGNOSIS — R748 Abnormal levels of other serum enzymes: Secondary | ICD-10-CM | POA: Diagnosis present

## 2019-03-03 DIAGNOSIS — R5383 Other fatigue: Secondary | ICD-10-CM

## 2019-03-03 DIAGNOSIS — Z8349 Family history of other endocrine, nutritional and metabolic diseases: Secondary | ICD-10-CM

## 2019-03-03 DIAGNOSIS — E785 Hyperlipidemia, unspecified: Secondary | ICD-10-CM | POA: Diagnosis present

## 2019-03-03 DIAGNOSIS — K219 Gastro-esophageal reflux disease without esophagitis: Secondary | ICD-10-CM | POA: Diagnosis present

## 2019-03-03 DIAGNOSIS — Z8261 Family history of arthritis: Secondary | ICD-10-CM

## 2019-03-03 DIAGNOSIS — I493 Ventricular premature depolarization: Secondary | ICD-10-CM | POA: Diagnosis present

## 2019-03-03 DIAGNOSIS — Z8249 Family history of ischemic heart disease and other diseases of the circulatory system: Secondary | ICD-10-CM

## 2019-03-03 DIAGNOSIS — R52 Pain, unspecified: Secondary | ICD-10-CM

## 2019-03-03 LAB — COMPREHENSIVE METABOLIC PANEL
ALT: 45 U/L — ABNORMAL HIGH (ref 0–44)
AST: 125 U/L — ABNORMAL HIGH (ref 15–41)
Albumin: 3.6 g/dL (ref 3.5–5.0)
Alkaline Phosphatase: 55 U/L (ref 38–126)
Anion gap: 8 (ref 5–15)
BUN: 10 mg/dL (ref 6–20)
CO2: 22 mmol/L (ref 22–32)
Calcium: 9.4 mg/dL (ref 8.9–10.3)
Chloride: 102 mmol/L (ref 98–111)
Creatinine, Ser: 0.91 mg/dL (ref 0.44–1.00)
GFR calc Af Amer: >60 mL/min (ref >60)
GFR calc non Af Amer: >60 mL/min (ref >60)
Glucose, Bld: 157 mg/dL — ABNORMAL HIGH (ref 70–99)
Potassium: 3.8 mmol/L (ref 3.5–5.1)
Sodium: 132 mmol/L — ABNORMAL LOW (ref 135–145)
Total Bilirubin: 3.7 mg/dL — ABNORMAL HIGH (ref 0.3–1.2)
Total Protein: 8 g/dL (ref 6.5–8.1)

## 2019-03-03 LAB — CBC
HCT: 18.3 % — ABNORMAL LOW (ref 36.0–46.0)
Hemoglobin: 6.1 g/dL — CL (ref 12.0–15.0)
MCH: 33.5 pg (ref 26.0–34.0)
MCHC: 33.3 g/dL (ref 30.0–36.0)
MCV: 100.5 fL — ABNORMAL HIGH (ref 80.0–100.0)
Platelets: <5 10*3/uL — CL (ref 150–400)
RBC: 1.82 MIL/uL — ABNORMAL LOW (ref 3.87–5.11)
RDW: 21.7 % — ABNORMAL HIGH (ref 11.5–15.5)
WBC: 2.8 10*3/uL — ABNORMAL LOW (ref 4.0–10.5)
nRBC: 11.2 % — ABNORMAL HIGH (ref 0.0–0.2)

## 2019-03-03 LAB — C-REACTIVE PROTEIN: CRP: <0.5 mg/dL (ref <1.0)

## 2019-03-03 LAB — D-DIMER, QUANTITATIVE: D-Dimer, Quant: 3.85 ug/mL-FEU — ABNORMAL HIGH (ref 0.00–0.50)

## 2019-03-03 LAB — I-STAT BETA HCG BLOOD, ED (MC, WL, AP ONLY): I-stat hCG, quantitative: <5 m[IU]/mL (ref <5)

## 2019-03-03 LAB — TRIGLYCERIDES: Triglycerides: 109 mg/dL (ref <150)

## 2019-03-03 LAB — FERRITIN: Ferritin: 1134 ng/mL — ABNORMAL HIGH (ref 11–307)

## 2019-03-03 LAB — PROTIME-INR
INR: 1.3 — ABNORMAL HIGH (ref 0.8–1.2)
Prothrombin Time: 15.6 seconds — ABNORMAL HIGH (ref 11.4–15.2)

## 2019-03-03 LAB — POC SARS CORONAVIRUS 2 AG -  ED: SARS Coronavirus 2 Ag: NEGATIVE

## 2019-03-03 LAB — LACTATE DEHYDROGENASE: LDH: 1049 U/L — ABNORMAL HIGH (ref 98–192)

## 2019-03-03 LAB — LIPASE, BLOOD: Lipase: 38 U/L (ref 11–51)

## 2019-03-03 LAB — LACTIC ACID, PLASMA: Lactic Acid, Venous: 1.4 mmol/L (ref 0.5–1.9)

## 2019-03-03 LAB — POC OCCULT BLOOD, ED: Fecal Occult Bld: NEGATIVE

## 2019-03-03 LAB — FIBRINOGEN: Fibrinogen: 263 mg/dL (ref 210–475)

## 2019-03-03 MED ORDER — ACETAMINOPHEN 325 MG PO TABS
650.0000 mg | ORAL_TABLET | Freq: Once | ORAL | Status: AC
  Administered 2019-03-03: 650 mg via ORAL
  Filled 2019-03-03: qty 2

## 2019-03-03 MED ORDER — SODIUM CHLORIDE 0.9% FLUSH: 3.0000 mL | Freq: Once | INTRAVENOUS | Status: DC

## 2019-03-03 NOTE — ED Provider Notes (Signed)
[facility] EMERGENCY DEPARTMENT Provider Note   CSN: [id number] Arrival date & time: 03/03/19  1839     History Chief Complaint  Patient presents with   Generalized Body Aches    Charlotte Korson is a 58 y.o. female.  The history is provided by the patient.  URI Presenting symptoms: cough, fatigue and fever   Presenting symptoms: no ear pain and no sore throat   Severity:  Moderate Onset quality:  Gradual Duration:  2 days Timing:  Constant Progression:  Unchanged Chronicity:  New Relieved by:  Nothing Worsened by:  Nothing Associated symptoms: myalgias   Associated symptoms: no arthralgias   Risk factors: sick contacts (roommate has had multiple exposures to [place])        Past Medical History:  Diagnosis Date   Allergy    Arthritis    Boil    Classical migraine with intractable migraine 04/09/2018   Diabetes ([place])    type 2   Elevated liver enzymes    Fatty liver    Hyperlipemia    Hypertension    Palpitations    frequent PVCs on event monitor   PVC's (premature ventricular contractions)     Patient Active Problem List   Diagnosis Date Noted   Insomnia 02/26/2019   Acquired pancytopenia ([place]) 11/01/2018   Liver cirrhosis secondary to NASH ([place]) 04/25/2018   Classical migraine with intractable migraine 04/09/2018   Spinal stenosis of lumbar region 01/17/2018   Palpitations 04/15/2015   Controlled type 2 diabetes mellitus without complication ([place]) 03/55/9741   Elevated liver enzymes 03/31/2014   Tinea pedis 07/31/2013   Foot and toe(s), blister, infected 07/31/2013   Plantar fascial fibromatosis 08/31/2012   Pain in joint, ankle and foot 08/31/2012    Past Surgical History:  Procedure Laterality Date   COLONOSCOPY  2014   [place]   UPPER GI ENDOSCOPY  08/2017   [facility]     OB History    Gravida  1   Para  0   Term  0   Preterm   0   AB  1   Living  1     SAB  0   TAB  0   Ectopic  1   Multiple  0   Live Births  0           Family History  Problem Relation Age of Onset   Hypertension Mother    Diabetes Mother    Heart attack Mother 52   Asthma Mother        Childhood   Arthritis Mother    Heart disease Mother    Hyperlipidemia Mother    Hypertension Sister    Diabetes Sister    Asthma Sister 78       asthma attack cause of death   Diabetes Brother    Hypertension Brother    Diabetes Brother    Hypertension Brother    Hypertension Maternal Grandmother    Heart attack Maternal Grandfather 40   Colon cancer Neg Hx     Social History   Tobacco Use   Smoking status: Never Smoker   Smokeless tobacco: Never Used  Substance Use Topics   Alcohol use: Yes    Alcohol/week: 3.0 standard drinks    Types: 3 Standard drinks or equivalent per week    Comment: trying to reduce   Drug use: No    Home Medications Prior to Admission  medications   Medication Sig Start Date End Date Taking? Authorizing Provider  acetaminophen (TYLENOL) 500 MG tablet Take 500 mg by mouth as needed for pain.    [provider]  alum & mag hydroxide-simeth (MAALOX PLUS) 400-400-40 MG/5ML suspension Take by mouth every 6 (six) hours as needed for indigestion.    [provider]  cholecalciferol (VITAMIN D3) 25 MCG (1000 UT) tablet Take 1,000 Units by mouth daily.    [provider]  fluorometholone (FML) 0.1 % ophthalmic suspension Apply to eye.    [provider]  glucose blood test strip One touch ultra 11/11/15   [provider]  mupirocin ointment (BACTROBAN) 2 % Apply 1 application topically 2 (two) times daily. 01/29/19   Tasia Catchings, Amy V, PA-C  topiramate (TOPAMAX) 25 MG tablet Take one tablet at night for one week, then take 2 tablets at night for one week, then take 3 tablets at night. 04/09/18   Kathrynn Ducking, MD  traZODone (DESYREL) 50 MG tablet  Take 50 mg by mouth at bedtime as needed for sleep.    [provider]  Turmeric POWD by Does not apply route. Pt takes 1-2 times weekly.    [provider]    Allergies    Metformin and related, Sulfa antibiotics, and Sulfasalazine  Review of Systems   Review of Systems  Constitutional: Positive for fatigue and fever. Negative for chills.  HENT: Negative for ear pain and sore throat.   Eyes: Negative for pain and visual disturbance.  Respiratory: Positive for cough and shortness of breath.   Cardiovascular: Negative for chest pain and palpitations.  Gastrointestinal: Negative for abdominal pain and vomiting.  Genitourinary: Negative for dysuria and hematuria.  Musculoskeletal: Positive for myalgias. Negative for arthralgias and back pain.  Skin: Negative for color change and rash.  Neurological: Negative for seizures and syncope.  All other systems reviewed and are negative.   Physical Exam Updated Vital Signs  ED Triage Vitals  Enc Vitals Group     BP 03/03/19 1904 (!) 114/91     Pulse Rate 03/03/19 1904 (!) 101     Resp 03/03/19 1904 18     Temp 03/03/19 1904 (!) 101.6 F (38.7 C)     Temp Source 03/03/19 1904 Oral     SpO2 03/03/19 1904 100 %     Weight 03/03/19 1920 151 lb (68.5 kg)     Height 03/03/19 1920 5' 2"  (1.575 m)     Head Circumference --      Peak Flow --      Pain Score 03/03/19 1920 2     Pain Loc --      Pain Edu? --      Excl. in [place]? --     Physical Exam Vitals and nursing note reviewed.  Constitutional:      General: She is not in acute distress.    Appearance: She is well-developed. She is not ill-appearing.  HENT:     Head: Normocephalic and atraumatic.     Nose: Nose normal.     Mouth/Throat:     Mouth: Mucous membranes are moist.  Eyes:     Extraocular Movements: Extraocular movements intact.     Conjunctiva/sclera: Conjunctivae normal.     Pupils: Pupils are equal, round, and reactive to light.  Cardiovascular:      Rate and Rhythm: Normal rate and regular rhythm.     Pulses: Normal pulses.     Heart sounds: Normal heart  sounds. No murmur.  Pulmonary:     Effort: Pulmonary effort is normal. No respiratory distress.     Breath sounds: Normal breath sounds.  Abdominal:     General: Abdomen is flat.     Palpations: Abdomen is soft.     Tenderness: There is no abdominal tenderness.  Genitourinary:    Rectum: Guaiac result negative.  Musculoskeletal:     Cervical back: Normal range of motion and neck supple.     Right lower leg: No edema.     Left lower leg: No edema.  Skin:    General: Skin is warm and dry.     Capillary Refill: Capillary refill takes less than 2 seconds.  Neurological:     General: No focal deficit present.     Mental Status: She is alert.  Psychiatric:        Mood and Affect: Mood normal.     ED Results / Procedures / Treatments   Labs (all labs ordered are listed, but only abnormal results are displayed) Labs Reviewed  COMPREHENSIVE METABOLIC PANEL - Abnormal; Notable for the following components:      Result Value   Sodium 132 (*)    Glucose, Bld 157 (*)    AST 125 (*)    ALT 45 (*)    Total Bilirubin 3.7 (*)    All other components within normal limits  CBC - Abnormal; Notable for the following components:   WBC 2.8 (*)    RBC 1.82 (*)    Hemoglobin 6.1 (*)    HCT 18.3 (*)    MCV 100.5 (*)    RDW 21.7 (*)    Platelets <5 (*)    nRBC 11.2 (*)    All other components within normal limits  D-DIMER, QUANTITATIVE (NOT AT [facility]) - Abnormal; Notable for the following components:   D-Dimer, Quant 3.85 (*)    All other components within normal limits  LACTATE DEHYDROGENASE - Abnormal; Notable for the following components:   LDH 1,049 (*)    All other components within normal limits  FERRITIN - Abnormal; Notable for the following components:   Ferritin 1,134 (*)    All other components within normal limits  PROTIME-INR - Abnormal; Notable for the following  components:   Prothrombin Time 15.6 (*)    INR 1.3 (*)    All other components within normal limits  CULTURE, BLOOD (ROUTINE X 2)  CULTURE, BLOOD (ROUTINE X 2)  URINE CULTURE  RESPIRATORY PANEL BY RT PCR (FLU A&B, COVID)  LIPASE, BLOOD  LACTIC ACID, PLASMA  TRIGLYCERIDES  FIBRINOGEN  C-REACTIVE PROTEIN  URINALYSIS, ROUTINE W REFLEX MICROSCOPIC  LACTIC ACID, PLASMA  PROCALCITONIN  PATHOLOGIST SMEAR REVIEW  CBC WITH DIFFERENTIAL/PLATELET  RETICULOCYTES  CBC WITH DIFFERENTIAL/PLATELET  I-STAT BETA HCG BLOOD, ED (MC, [facility], AP ONLY)  POC SARS CORONAVIRUS 2 AG -  ED  POC OCCULT BLOOD, ED  TYPE AND SCREEN  ABO/RH  PREPARE PLATELET PHERESIS    EKG EKG Interpretation  Date/Time:  Sunday March 03 2019 22:46:03 EST Ventricular Rate:  93 PR Interval:    QRS Duration: 81 QT Interval:  366 QTC Calculation: 456 R Axis:   -9 Text Interpretation: Sinus rhythm Low voltage, precordial leads Borderline T abnormalities, anterior leads When compared with ECG of 04/11/2015, Premature ventricular complexes are no longer present Confirmed by Delora Fuel ([id number]) on 03/03/2019 11:49:22 PM   Radiology DG Chest Portable 1 View  Result Date: 03/03/2019 CLINICAL DATA:  58 year old female with fever.  EXAM: PORTABLE CHEST 1 VIEW COMPARISON:  Chest radiograph dated 10/28/2017. FINDINGS: The heart size and mediastinal contours are within normal limits. Both lungs are clear. The visualized skeletal structures are unremarkable. IMPRESSION: No active disease. Electronically Signed   By: Anner Crete M.D.   On: 03/03/2019 22:55   US Abdomen Limited RUQ  Result Date: 03/03/2019 CLINICAL DATA:  Abdominal pain for 1-2 weeks, history of non alcoholic cirrhosis EXAM: ULTRASOUND ABDOMEN LIMITED RIGHT UPPER QUADRANT COMPARISON:  Ultrasound 09/05/2016, CT abdomen pelvis 04/10/2018 FINDINGS: Gallbladder: No gallstones or wall thickening visualized. No sonographic Murphy sign noted by sonographer. Common bile  duct: Diameter: 3.1 mm proximally, 2.7 mm distally.  Nondilated. Liver: Diffusely increased hepatic echogenicity with loss of definition of the portal triads and diminished posterior through transmission compatible with hepatic steatosis. Mildly nodular hepatic surface contour. No focal lesions. Portal vein is patent on color Doppler imaging with normal direction of blood flow towards the liver. Other: None. IMPRESSION: Diffusely increased hepatic echogenicity compatible with intrinsic liver disease most commonly hepatic steatosis. The nodular surface contour of the liver is also suggestive of some underlying cirrhotic change compatible with patient's history. Otherwise unremarkable right upper quadrant ultrasound. Electronically Signed   By: Lovena Le M.D.   On: 03/03/2019 22:47    Procedures .Critical Care Performed by: Lennice Sites, DO Authorized by: Lennice Sites, DO   Critical care provider statement:    Critical care time (minutes):  45   Critical care was necessary to treat or prevent imminent or life-threatening deterioration of the following conditions:  Sepsis   Critical care was time spent personally by me on the following activities:  Blood draw for specimens, development of treatment plan with patient or surrogate, discussions with consultants, discussions with primary provider, evaluation of patient's response to treatment, examination of patient, obtaining history from patient or surrogate, ordering and performing treatments and interventions, ordering and review of laboratory studies, ordering and review of radiographic studies, pulse oximetry, re-evaluation of patient's condition and review of old charts   I assumed direction of critical care for this patient from another provider in my specialty: no     (including critical care time)  Medications Ordered in ED Medications  sodium chloride flush (NS) 0.9 % injection 3 mL (3 mLs Intravenous Not Given 03/03/19 2139)  0.9 %   sodium chloride infusion (Manually program via Guardrails IV Fluids) (has no administration in time range)  acetaminophen (TYLENOL) tablet 650 mg (650 mg Oral Given 03/03/19 2144)    ED Course  I have reviewed the triage vital signs and the nursing notes.  Pertinent labs & imaging results that were available during my care of the patient were reviewed by me and considered in my medical decision making (see chart for details).    MDM Rules/Calculators/A&P  Leonor Godina is a 58 year old female with history of diabetes, liver cirrhosis secondary to [facility], hypertension, high cholesterol, history of pancytopenia who presents to the ED with fever.  Patient with possible exposure to Covid.  Has had viral type symptoms since yesterday.  She has 2 areas in her groin of what appeared to be old abscesses but no drainage.  Do not believe these are source for infection.  Has overall viral symptoms.  Has had some generalized body aches and some focal right-sided abdominal pain.  However, does not have any abdominal tenderness on exam.  Infectious work-up was initiated and Covid screening labs were ordered.  Rapid Covid test is negative.  CBC does show  pancytopenia.  Hemoglobin is 6.1, platelets overall undetectable.  White count is 2.4.  However fibrinogen is normal.  INR 1.3.  Ferritin and LDH are both elevated to about 1000.  D-dimer elevated at 3.8.  Patient with right upper quadrant ultrasound that was overall unremarkable.  Chest x-ray showed no signs of infection.  Overall, patient has had extensive hematology work-up outpatient for pancytopenia.  Initially it appears that they have thought that this is due to her cirrhosis and chronic liver disease.  Possibly that this is ongoing suppression from a viral process however could be another type of hematology process such as MDS.  Does not appear to have DIC given normal fibrinogen.  Possibly a hemolytic process given elevated LDH, ferritin.  However will add  reticulocytes after talking with Dr. Marin Olp with hematology.  Will transfuse a unit of platelets given that platelets are less than 10,000.  We will not transfuse lose any packed red blood cells as hemoglobin is 6.1 and no signs of active bleeding.  Hemoccult was negative.  Pathology smear is already in process.  Patient overall appears well.  Hemodynamically stable.  Admitted to medicine for further care.  Oncology to evaluate the patient in the morning. Differential pending but will start vanc/cefepime given unknown if neutropenic.  This chart was dictated using voice recognition software.  Despite best efforts to proofread,  errors can occur which can change the documentation meaning.  Marnesha Gagen was evaluated in Emergency Department on 03/04/2019 for the symptoms described in the history of present illness. She was evaluated in the context of the global COVID-19 pandemic, which necessitated consideration that the patient might be at risk for infection with the SARS-CoV-2 virus that causes COVID-19. Institutional protocols and algorithms that pertain to the evaluation of patients at risk for COVID-19 are in a state of rapid change based on information released by regulatory bodies including the CDC and federal and state organizations. These policies and algorithms were followed during the patient's care in the ED.   Final Clinical Impression(s) / ED Diagnoses Final diagnoses:  Pain  Fever, unspecified fever cause  Pancytopenia ([place])  Flu-like symptoms    Rx / DC Orders ED Discharge Orders    None       Lennice Sites, DO 03/04/19 0016

## 2019-03-03 NOTE — ED Provider Notes (Signed)
[place]    CSN: [id number] Arrival date & time: 03/03/19  1738      History   Chief Complaint Chief Complaint  Patient presents with  . covid s/s  . boil    HPI Vicki [name] is a 58 y.o. female.   Pt is a 58 year old female with PMH allergy, arthritis, boils, migraine, diabetes, fatty liver, liver cirrhosis secondary to NASH, HTN, Hyperlipidemia, PVC, acquired pancytopenia. She presents with cough, chills, fever, fatigue, chest pain, SOB. Symptoms have been constant and worsening. Chest pain is exertional and with deep breathing. Fever 101-102 at home. She has also had abscess to groin area without drainage. Drinking Pedialyte. Not taking any medications. Pt had recent blood work done that was concerning with decreasing WBC count and platelets. Last WBC was 1.5 and platelets 34 on December 9th. sts that she has been bruising more lately. Was seen by oncology on the 9th and told that if she had any worsening symptoms then she would need to be evaluated in the ER.   ROS per HPI      Past Medical History:  Diagnosis Date  . Allergy   . Arthritis   . Boil   . 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 Diabetes ([place])    type 2  . Elevated liver enzymes   . Fatty liver   . Hyperlipemia   . Hypertension   . 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 PVC's (premature ventricular contractions)     Patient Active Problem List   Diagnosis Date Noted  . Insomnia 02/26/2019  . Acquired pancytopenia ([place]) 11/01/2018  . Liver cirrhosis secondary to NASH ([place]) 04/25/2018  . 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 Spinal stenosis of lumbar region 01/17/2018  . Palpitations 04/15/2015  . Controlled type 2 diabetes mellitus without complication ([place]) 27/25/3664  . Elevated liver enzymes 03/31/2014  . Tinea pedis 07/31/2013  . Foot and toe(s), blister, infected 07/31/2013  . 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 Pain  in joint, ankle and foot 08/31/2012    Past Surgical History:  Procedure Laterality Date  . COLONOSCOPY  2014  . ECTOPIC PREGNANCY SURGERY  1990  . TONSILLECTOMY  1980  . UPPER GI ENDOSCOPY  08/2017   [facility]    OB History    Gravida  1   Para  0   Term  0   Preterm  0   AB  1   Living  1     SAB  0   TAB  0   Ectopic  1   Multiple  0   Live Births  0            Home Medications    Prior to Admission medications   Medication Sig Start Date End Date Taking? Authorizing Provider  acetaminophen (TYLENOL) 500 MG tablet Take 500 mg by mouth as needed for pain.    [provider]  alum & mag hydroxide-simeth (MAALOX PLUS) 400-400-40 MG/5ML suspension Take by mouth every 6 (six) hours as needed for indigestion.    [provider]  cholecalciferol (VITAMIN D3) 25 MCG (1000 UT) tablet Take 1,000 Units by mouth daily.    [provider]  fluorometholone (FML) 0.1 % ophthalmic suspension Apply to eye.    [provider]  glucose blood test strip One touch ultra 11/11/15   [provider]  mupirocin ointment (BACTROBAN) 2 % Apply 1  application topically 2 (two) times daily. 01/29/19   Tasia Catchings, Amy V, PA-C  topiramate (TOPAMAX) 25 MG tablet Take one tablet at night for one week, then take 2 tablets at night for one week, then take 3 tablets at night. 04/09/18   Kathrynn Ducking, MD  traZODone (DESYREL) 50 MG tablet Take 50 mg by mouth at bedtime as needed for sleep.    [provider]  Turmeric POWD by Does not apply route. Pt takes 1-2 times weekly.    [provider]    Family History Family History  Problem Relation Age of Onset  . Hypertension Mother   . Diabetes Mother   . Heart attack Mother 30  . Asthma Mother        Childhood  . Arthritis Mother   . Heart disease Mother   . Hyperlipidemia Mother   . Hypertension Sister   . Diabetes Sister   . Asthma Sister 40       asthma attack  cause of death  . Diabetes Brother   . Hypertension Brother   . Diabetes Brother   . Hypertension Brother   . Hypertension Maternal Grandmother   . Heart attack Maternal Grandfather 60  . Colon cancer Neg Hx     Social History Social History   Tobacco Use  . Smoking status: Never Smoker  . Smokeless tobacco: Never Used  Substance Use Topics  . Alcohol use: Yes    Alcohol/week: 3.0 standard drinks    Types: 3 Standard drinks or equivalent per week    Comment: trying to reduce  . Drug use: No     Allergies   Metformin and related, Sulfa antibiotics, and Sulfasalazine   Review of Systems Review of Systems   Physical Exam Triage Vital Signs ED Triage Vitals  Enc Vitals Group     BP 03/03/19 1804 133/78     Pulse Rate 03/03/19 1804 98     Resp 03/03/19 1804 18     Temp 03/03/19 1804 99.4 F (37.4 C)     Temp Source 03/03/19 1804 Oral     SpO2 03/03/19 1804 100 %     Weight --      Height --      Head Circumference --      Peak Flow --      Pain Score 03/03/19 1805 0     Pain Loc --      Pain Edu? --      Excl. in [place]? --    No data found.  Updated Vital Signs BP 133/78 (BP Location: Left Arm)   Pulse 98   Temp 99.4 F (37.4 C) (Oral)   Resp 18   LMP 04/30/2012   SpO2 100%   Visual Acuity Right Eye Distance:   Left Eye Distance:   Bilateral Distance:    Right Eye Near:   Left Eye Near:    Bilateral Near:     Physical Exam Vitals and nursing note reviewed.  Constitutional:      General: She is not in acute distress.    Appearance: Normal appearance. She is not ill-appearing, toxic-appearing or diaphoretic.  HENT:     Head: Normocephalic.     Nose: Nose normal.     Mouth/Throat:     Pharynx: Oropharynx is clear.  Eyes:     Conjunctiva/sclera: Conjunctivae normal.  Pulmonary:     Effort: Pulmonary effort is normal.  Musculoskeletal:        General: Normal range  of motion.     Cervical back: Normal range of motion.  Skin:    General:  Skin is warm and dry.     Findings: No rash.  Neurological:     Mental Status: She is alert.  Psychiatric:        Mood and Affect: Mood normal.      UC Treatments / Results  Labs (all labs ordered are listed, but only abnormal results are displayed) Labs Reviewed  NOVEL CORONAVIRUS, NAA (HOSP ORDER, SEND-OUT TO REF LAB; TAT 18-24 HRS)    EKG   Radiology No results found.  Procedures Procedures (including critical care time)  Medications Ordered in UC Medications - No data to display  Initial Impression / Assessment and Plan / UC Course  I have reviewed the triage vital signs and the nursing notes.  Pertinent labs & imaging results that were available during my care of the patient were reviewed by me and considered in my medical decision making (see chart for details).     Based on hx, recent labs that are concerning and symptoms I recommended pt go to the ER for further evaluation. Pt agreed.  Final Clinical Impressions(s) / UC Diagnoses   Final diagnoses:  Platelets decreased (HCC)  Fatigue, unspecified type  Fever, unspecified     Discharge Instructions     Please Go to the ER.     ED Prescriptions    None     PDMP not reviewed this encounter.   Orvan July, NP 03/03/19 2004

## 2019-03-03 NOTE — ED Notes (Signed)
Cr Lab- HGB 6.1, Platelets >5

## 2019-03-03 NOTE — Discharge Instructions (Addendum)
Please Go to the ER.

## 2019-03-03 NOTE — ED Triage Notes (Signed)
The pt was sent here from urgent care  The pt has had multiple symptoms for 1-2 weeks she has a cold cough  And nasal and head comgestion  She has non-alcoholic cirrhosis.  She is c/o an ancess in her lt groin for 1-2 weeks

## 2019-03-03 NOTE — ED Triage Notes (Signed)
Pt c/o cough, chills, fatigue x1 week; left groin "boil" no drainage; fever at home of 101/102 F. Been taking pedialyte; no anti-pyretics. Pleuritic type CP with exertion and deep breathing.

## 2019-03-04 ENCOUNTER — Encounter (HOSPITAL_COMMUNITY): Payer: Self-pay | Admitting: Internal Medicine

## 2019-03-04 DIAGNOSIS — E119 Type 2 diabetes mellitus without complications: Secondary | ICD-10-CM | POA: Diagnosis not present

## 2019-03-04 DIAGNOSIS — E785 Hyperlipidemia, unspecified: Secondary | ICD-10-CM | POA: Diagnosis present

## 2019-03-04 DIAGNOSIS — E1165 Type 2 diabetes mellitus with hyperglycemia: Secondary | ICD-10-CM | POA: Diagnosis not present

## 2019-03-04 DIAGNOSIS — Z825 Family history of asthma and other chronic lower respiratory diseases: Secondary | ICD-10-CM | POA: Diagnosis not present

## 2019-03-04 DIAGNOSIS — L299 Pruritus, unspecified: Secondary | ICD-10-CM | POA: Diagnosis not present

## 2019-03-04 DIAGNOSIS — L02214 Cutaneous abscess of groin: Secondary | ICD-10-CM | POA: Diagnosis present

## 2019-03-04 DIAGNOSIS — T380X5A Adverse effect of glucocorticoids and synthetic analogues, initial encounter: Secondary | ICD-10-CM | POA: Diagnosis not present

## 2019-03-04 DIAGNOSIS — R233 Spontaneous ecchymoses: Secondary | ICD-10-CM | POA: Diagnosis present

## 2019-03-04 DIAGNOSIS — I10 Essential (primary) hypertension: Secondary | ICD-10-CM | POA: Diagnosis present

## 2019-03-04 DIAGNOSIS — R21 Rash and other nonspecific skin eruption: Secondary | ICD-10-CM | POA: Diagnosis not present

## 2019-03-04 DIAGNOSIS — L0292 Furuncle, unspecified: Secondary | ICD-10-CM | POA: Diagnosis present

## 2019-03-04 DIAGNOSIS — I493 Ventricular premature depolarization: Secondary | ICD-10-CM | POA: Diagnosis present

## 2019-03-04 DIAGNOSIS — M311 Thrombotic microangiopathy: Secondary | ICD-10-CM | POA: Diagnosis present

## 2019-03-04 DIAGNOSIS — K219 Gastro-esophageal reflux disease without esophagitis: Secondary | ICD-10-CM | POA: Diagnosis present

## 2019-03-04 DIAGNOSIS — R509 Fever, unspecified: Secondary | ICD-10-CM | POA: Diagnosis not present

## 2019-03-04 DIAGNOSIS — R600 Localized edema: Secondary | ICD-10-CM | POA: Diagnosis not present

## 2019-03-04 DIAGNOSIS — T458X5A Adverse effect of other primarily systemic and hematological agents, initial encounter: Secondary | ICD-10-CM | POA: Diagnosis not present

## 2019-03-04 DIAGNOSIS — D693 Immune thrombocytopenic purpura: Secondary | ICD-10-CM | POA: Diagnosis not present

## 2019-03-04 DIAGNOSIS — Z8249 Family history of ischemic heart disease and other diseases of the circulatory system: Secondary | ICD-10-CM | POA: Diagnosis not present

## 2019-03-04 DIAGNOSIS — Z888 Allergy status to other drugs, medicaments and biological substances status: Secondary | ICD-10-CM | POA: Diagnosis not present

## 2019-03-04 DIAGNOSIS — Z882 Allergy status to sulfonamides status: Secondary | ICD-10-CM | POA: Diagnosis not present

## 2019-03-04 DIAGNOSIS — Z833 Family history of diabetes mellitus: Secondary | ICD-10-CM | POA: Diagnosis not present

## 2019-03-04 DIAGNOSIS — D61818 Other pancytopenia: Secondary | ICD-10-CM

## 2019-03-04 DIAGNOSIS — K7581 Nonalcoholic steatohepatitis (NASH): Secondary | ICD-10-CM | POA: Diagnosis present

## 2019-03-04 DIAGNOSIS — R5081 Fever presenting with conditions classified elsewhere: Secondary | ICD-10-CM | POA: Insufficient documentation

## 2019-03-04 DIAGNOSIS — K7460 Unspecified cirrhosis of liver: Secondary | ICD-10-CM | POA: Diagnosis present

## 2019-03-04 DIAGNOSIS — D709 Neutropenia, unspecified: Secondary | ICD-10-CM | POA: Insufficient documentation

## 2019-03-04 DIAGNOSIS — Y92230 Patient room in hospital as the place of occurrence of the external cause: Secondary | ICD-10-CM | POA: Diagnosis not present

## 2019-03-04 DIAGNOSIS — D696 Thrombocytopenia, unspecified: Secondary | ICD-10-CM | POA: Diagnosis not present

## 2019-03-04 DIAGNOSIS — Z20822 Contact with and (suspected) exposure to covid-19: Secondary | ICD-10-CM | POA: Diagnosis present

## 2019-03-04 DIAGNOSIS — Z4901 Encounter for fitting and adjustment of extracorporeal dialysis catheter: Secondary | ICD-10-CM | POA: Diagnosis not present

## 2019-03-04 LAB — URINALYSIS, ROUTINE W REFLEX MICROSCOPIC
Bacteria, UA: NONE SEEN
Bilirubin Urine: NEGATIVE
Glucose, UA: NEGATIVE mg/dL
Ketones, ur: NEGATIVE mg/dL
Leukocytes,Ua: NEGATIVE
Nitrite: NEGATIVE
Protein, ur: NEGATIVE mg/dL
Specific Gravity, Urine: 1.005 (ref 1.005–1.030)
pH: 7 (ref 5.0–8.0)

## 2019-03-04 LAB — MRSA PCR SCREENING: MRSA by PCR: NEGATIVE

## 2019-03-04 LAB — RETICULOCYTES
Immature Retic Fract: 36.3 % — ABNORMAL HIGH (ref 2.3–15.9)
RBC.: 1.93 MIL/uL — ABNORMAL LOW (ref 3.87–5.11)
Retic Count, Absolute: 194.7 10*3/uL — ABNORMAL HIGH (ref 19.0–186.0)
Retic Ct Pct: 10.1 % — ABNORMAL HIGH (ref 0.4–3.1)

## 2019-03-04 LAB — HIV ANTIBODY (ROUTINE TESTING W REFLEX): HIV Screen 4th Generation wRfx: NONREACTIVE

## 2019-03-04 LAB — CBC WITH DIFFERENTIAL/PLATELET
Abs Immature Granulocytes: 0.06 10*3/uL (ref 0.00–0.07)
Abs Immature Granulocytes: 0.06 10*3/uL (ref 0.00–0.07)
Basophils Absolute: 0 10*3/uL (ref 0.0–0.1)
Basophils Absolute: 0 10*3/uL (ref 0.0–0.1)
Basophils Relative: 0 %
Basophils Relative: 0 %
Eosinophils Absolute: 0 10*3/uL (ref 0.0–0.5)
Eosinophils Absolute: 0 10*3/uL (ref 0.0–0.5)
Eosinophils Relative: 1 %
Eosinophils Relative: 1 %
HCT: 18.9 % — ABNORMAL LOW (ref 36.0–46.0)
HCT: 18.4 % — ABNORMAL LOW (ref 36.0–46.0)
Hemoglobin: 6 g/dL — CL (ref 12.0–15.0)
Hemoglobin: 5.9 g/dL — CL (ref 12.0–15.0)
Immature Granulocytes: 2 %
Immature Granulocytes: 2 %
Lymphocytes Relative: 42 %
Lymphocytes Relative: 43 %
Lymphs Abs: 1.5 10*3/uL (ref 0.7–4.0)
Lymphs Abs: 1.4 10*3/uL (ref 0.7–4.0)
MCH: 32.6 pg (ref 26.0–34.0)
MCH: 32.6 pg (ref 26.0–34.0)
MCHC: 31.7 g/dL (ref 30.0–36.0)
MCHC: 32.1 g/dL (ref 30.0–36.0)
MCV: 102.7 fL — ABNORMAL HIGH (ref 80.0–100.0)
MCV: 101.7 fL — ABNORMAL HIGH (ref 80.0–100.0)
Monocytes Absolute: 0.2 10*3/uL (ref 0.1–1.0)
Monocytes Absolute: 0.2 10*3/uL (ref 0.1–1.0)
Monocytes Relative: 6 %
Monocytes Relative: 7 %
Neutro Abs: 1.7 10*3/uL (ref 1.7–7.7)
Neutro Abs: 1.5 10*3/uL — ABNORMAL LOW (ref 1.7–7.7)
Neutrophils Relative %: 49 %
Neutrophils Relative %: 47 %
Platelets: <5 10*3/uL — CL (ref 150–400)
Platelets: 42 10*3/uL — ABNORMAL LOW (ref 150–400)
RBC: 1.84 MIL/uL — ABNORMAL LOW (ref 3.87–5.11)
RBC: 1.81 MIL/uL — ABNORMAL LOW (ref 3.87–5.11)
RDW: 21.5 % — ABNORMAL HIGH (ref 11.5–15.5)
RDW: 22.4 % — ABNORMAL HIGH (ref 11.5–15.5)
WBC: 3.5 10*3/uL — ABNORMAL LOW (ref 4.0–10.5)
WBC: 3.2 10*3/uL — ABNORMAL LOW (ref 4.0–10.5)
nRBC: 8.9 % — ABNORMAL HIGH (ref 0.0–0.2)
nRBC: 9.5 % — ABNORMAL HIGH (ref 0.0–0.2)

## 2019-03-04 LAB — COMPREHENSIVE METABOLIC PANEL
ALT: 43 U/L (ref 0–44)
AST: 122 U/L — ABNORMAL HIGH (ref 15–41)
Albumin: 3.5 g/dL (ref 3.5–5.0)
Alkaline Phosphatase: 56 U/L (ref 38–126)
Anion gap: 13 (ref 5–15)
BUN: 8 mg/dL (ref 6–20)
CO2: 21 mmol/L — ABNORMAL LOW (ref 22–32)
Calcium: 9.5 mg/dL (ref 8.9–10.3)
Chloride: 103 mmol/L (ref 98–111)
Creatinine, Ser: 0.75 mg/dL (ref 0.44–1.00)
GFR calc Af Amer: >60 mL/min (ref >60)
GFR calc non Af Amer: >60 mL/min (ref >60)
Glucose, Bld: 141 mg/dL — ABNORMAL HIGH (ref 70–99)
Potassium: 3.8 mmol/L (ref 3.5–5.1)
Sodium: 137 mmol/L (ref 135–145)
Total Bilirubin: 3.4 mg/dL — ABNORMAL HIGH (ref 0.3–1.2)
Total Protein: 7.7 g/dL (ref 6.5–8.1)

## 2019-03-04 LAB — BILIRUBIN, FRACTIONATED(TOT/DIR/INDIR)
Bilirubin, Direct: 0.8 mg/dL — ABNORMAL HIGH (ref 0.0–0.2)
Indirect Bilirubin: 1.8 mg/dL — ABNORMAL HIGH (ref 0.3–0.9)
Total Bilirubin: 2.6 mg/dL — ABNORMAL HIGH (ref 0.3–1.2)

## 2019-03-04 LAB — URINE CULTURE: Culture: NO GROWTH

## 2019-03-04 LAB — PREPARE RBC (CROSSMATCH)

## 2019-03-04 LAB — RESPIRATORY PANEL BY RT PCR (FLU A&B, COVID)
Influenza A by PCR: NEGATIVE
Influenza B by PCR: NEGATIVE
SARS Coronavirus 2 by RT PCR: NEGATIVE

## 2019-03-04 LAB — ABO/RH: ABO/RH(D): O POS

## 2019-03-04 LAB — PATHOLOGIST SMEAR REVIEW

## 2019-03-04 LAB — PROCALCITONIN: Procalcitonin: <0.1 ng/mL

## 2019-03-04 MED ORDER — ACETAMINOPHEN 325 MG PO TABS: 650.0000 mg | ORAL_TABLET | Freq: Four times a day (QID) | ORAL | Status: DC | PRN

## 2019-03-04 MED ORDER — SODIUM CHLORIDE 0.9 % IV SOLN: 2.0000 g | Freq: Once | INTRAVENOUS | Status: DC

## 2019-03-04 MED ORDER — ACETAMINOPHEN 650 MG RE SUPP: 650.0000 mg | Freq: Four times a day (QID) | RECTAL | Status: DC | PRN

## 2019-03-04 MED ORDER — SODIUM CHLORIDE 0.9% IV SOLUTION
Freq: Once | INTRAVENOUS | Status: AC
  Administered 2019-03-04: 10 mL/h via INTRAVENOUS

## 2019-03-04 MED ORDER — VANCOMYCIN HCL 1250 MG/250ML IV SOLN
1250.0000 mg | Freq: Once | INTRAVENOUS | Status: DC
  Filled 2019-03-04: qty 250

## 2019-03-04 MED ORDER — ONDANSETRON HCL 4 MG PO TABS: 4.0000 mg | ORAL_TABLET | Freq: Four times a day (QID) | ORAL | Status: DC | PRN

## 2019-03-04 MED ORDER — ONDANSETRON HCL 4 MG/2ML IJ SOLN: 4.0000 mg | Freq: Four times a day (QID) | INTRAMUSCULAR | Status: DC | PRN

## 2019-03-04 MED ORDER — SODIUM CHLORIDE 0.9% IV SOLUTION: Freq: Once | INTRAVENOUS | Status: DC

## 2019-03-04 MED ORDER — VANCOMYCIN HCL 1250 MG/250ML IV SOLN
1250.0000 mg | INTRAVENOUS | Status: DC
  Administered 2019-03-04 – 2019-03-06 (×3): 1250 mg via INTRAVENOUS
  Filled 2019-03-04 (×5): qty 250

## 2019-03-04 MED ORDER — IBUPROFEN 200 MG PO TABS
200.0000 mg | ORAL_TABLET | Freq: Four times a day (QID) | ORAL | Status: DC | PRN
  Administered 2019-03-04: 200 mg via ORAL
  Filled 2019-03-04 (×2): qty 1

## 2019-03-04 MED ORDER — SODIUM CHLORIDE 0.9 % IV SOLN
2.0000 g | Freq: Three times a day (TID) | INTRAVENOUS | Status: DC
  Administered 2019-03-04 – 2019-03-05 (×3): 2 g via INTRAVENOUS
  Filled 2019-03-04 (×5): qty 2

## 2019-03-04 NOTE — Consult Note (Addendum)
Vicki [name]  [phone] [phone]    [place]  Referring MD:  Dr. 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  She presented to the emergency room with a 2-day history of myalgias, cough, fatigue, and fever.  In the emergency room, she was found to have a fever of 101.6.  Labs showed a white blood cell count 2.8, hemoglobin 6.1, platelet count less than 5000, AST 125, ALT 45, total bilirubin 3.7.  Chest x-ray showed no active disease.  Ultrasound of the right upper quadrant showed diffusely increased hepatic echogenicity compatible with intrinsic liver disease most commonly hepatic steatosis, the nodular surface contour of the liver is also suggestive of some underlying cirrhotic change compatible with patient's history.  Additional work-up included an elevated LDH at 1049, ferritin 1,134, fibrinogen 263, elevated absolute reticulocyte count at 194.7 elevated immature reticulocyte fraction 36.3%.  Stool for occult blood was negative.  COVID-19 testing and respiratory panel were negative.  Urine cultures and blood cultures are pending.    The patient has been followed by Dr. Irene Limbo at the [facility] for her pancytopenia.  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  Cytogenetics were normal.  Her most recent visit was on 02/13/2019 and on that date her white blood cell count was 1.5, hemoglobin 10.5, and platelet count 34,000.  Her total bilirubin was normal at 1.0.  Additional work-up on that date was unremarkable with exception of  a mildly elevated ferritin at 339.  She has received 1 unit of platelets with improvement of her platelet count of 42,000.  Today, the patient tells me that she developed worsening fatigue/weakness and chills which prompted her evaluation in the emergency room.  She states that she has had several boils in her groin which have been ongoing for several months.  She was given creams initially which helped.  However, these areas recurred.  She reports that these areas sometimes drain.  She states that she has been having fevers at home up to 101.2.  Reports mild headache.  Reports nonproductive cough and shortness of breath with exertion.  She has some mild chest discomfort at times.  Denies abdominal pain, nausea, vomiting, constipation, diarrhea.  Reports bleeding gums only when she brushes her teeth.  She has not noticed any epistaxis, hematemesis, hemoptysis, hematuria, melena, hematochezia.  With regards to her liver disease, she is followed at [facility].  The patient states that they are considering a liver transplant at some point in the future when her liver function worsens.  She has not yet been listed for transplant.  Hematology was asked see the patient back recommendations regarding her pancytopenia.  Past Medical History:  Diagnosis Date  . Allergy   . Arthritis   . Boil   . 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 Diabetes ([place])    type 2  . Elevated liver enzymes   . Fatty liver   . Hyperlipemia   . Hypertension   . 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 PVC's (premature ventricular contractions)   :    Past Surgical History:  Procedure  Laterality Date  . COLONOSCOPY  2014  . ECTOPIC PREGNANCY SURGERY  1990  . TONSILLECTOMY  1980  . UPPER GI ENDOSCOPY  08/2017   [facility]  :   CURRENT MEDS: Current Facility-Administered Medications  Medication Dose Route Frequency Provider Last Rate Last Admin  . 0.9 %  sodium chloride infusion  (Manually program via Guardrails IV Fluids)   Intravenous Once Irene Pap N, DO      . ceFEPIme (MAXIPIME) 2 g in sodium chloride 0.9 % 100 mL IVPB  2 g Intravenous Q8H Skeet Simmer, RPH 200 mL/hr at 03/04/19 1107 2 g at 03/04/19 1107  . ibuprofen (ADVIL) tablet 200 mg  200 mg Oral Q6H PRN Irene Pap N, DO   200 mg at 03/04/19 1140  . ondansetron (ZOFRAN) tablet 4 mg  4 mg Oral Q6H PRN Etta Quill, DO       Or  . ondansetron [facility]) injection 4 mg  4 mg Intravenous Q6H PRN Etta Quill, DO      . sodium chloride flush (NS) 0.9 % injection 3 mL  3 mL Intravenous Once Curatolo, Adam, DO      . vancomycin (VANCOREADY) IVPB 1250 mg/250 mL  1,250 mg Intravenous Q24H Skeet Simmer, RPH 166.7 mL/hr at 03/04/19 1146 1,250 mg at 03/04/19 1146      Allergies  Allergen Reactions  . Metformin And Related Itching  . Sulfa Antibiotics Itching  . Sulfasalazine Itching  :  Family History  Problem Relation Age of Onset  . Hypertension Mother   . Diabetes Mother   . Heart attack Mother 12  . Asthma Mother        Childhood  . Arthritis Mother   . Heart disease Mother   . Hyperlipidemia Mother   . Hypertension Sister   . Diabetes Sister   . Asthma Sister 40       asthma attack cause of death  . Diabetes Brother   . Hypertension Brother   . Diabetes Brother   . Hypertension Brother   . Hypertension Maternal Grandmother   . Heart attack Maternal Grandfather 60  . Colon cancer Neg Hx   :  Social History   Socioeconomic History  . Marital status: Single    Spouse name: Not on file  . Number of children: 1  . Years of education: Not on file  . Highest education level: Some college, no degree  Occupational History    Employer: [employer]  Tobacco Use  . Smoking status: Never Smoker  . Smokeless tobacco: Never Used  Substance and Sexual Activity  . Alcohol use: Yes    Alcohol/week: 3.0 standard drinks    Types: 3 Standard drinks or equivalent per week    Comment: trying to  reduce  . Drug use: No  . Sexual activity: Not Currently    Birth control/protection: Condom  Other Topics Concern  . 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 Difficulty of Paying Living Expenses: Not on file  Food Insecurity:   . Worried About Charity fundraiser in the Last Year: Not on file  . Ran Out of Food in the Last Year: Not on file  Transportation Needs:   . Lack of Transportation (Medical): Not on file  . Lack of Transportation (Non-Medical): Not on file  Physical Activity:   . Days of Exercise per Week: Not on file  . Minutes of Exercise per Session: Not on file  Stress:   . Feeling of Stress : Not on file  Social Connections:   . Frequency of Communication with Friends and Family: Not on file  . Frequency of Social Gatherings with Friends and Family: Not on file  . Attends Religious Services: Not on file  . Active Member of Clubs or Organizations: Not on file  . Attends Archivist Meetings: Not on file  . Marital Status: Not on file  Intimate Partner Violence:   . Fear of Current or Ex-Partner: Not on file  . Emotionally Abused: Not on file  . Physically Abused: Not on file  . Sexually Abused: Not on file  :  REVIEW OF SYSTEMS: A comprehensive 14 point review of systems was negative except as noted in the HPI.  Exam: Patient Vitals for the past 24 hrs:  BP Temp Temp src Pulse Resp SpO2 Height Weight  03/04/19 1130 116/70 99.3 F (37.4 C) Oral 97 18 98 % -- --  03/04/19 1122 122/72 (!) 100.6 F (38.1 C)  Oral 95 20 98 % -- --  03/04/19 0810 137/66 99.9 F (37.7 C) Oral (!) 101 20 100 % -- --  03/04/19 0407 (!) 142/78 (!) 100.4 F (38 C) Oral 93 18 100 % 5' 2"  (1.575 m) 145 lb 14.4 oz (66.2 kg)  03/04/19 0330 135/77 -- -- 87 (!) 23 100 % -- --  03/04/19 0315 131/76 -- -- 90 20 100 % -- --  03/04/19 0300 137/75 -- -- 90 (!) 22 100 % -- --  03/04/19 0258 -- 99 F (37.2 C) -- -- -- -- -- --  03/04/19 0245 126/74 -- -- 90 (!) 23 100 % -- --  03/04/19 0230 112/67 -- -- 81 (!) 21 100 % -- --  03/04/19 0215 122/78 -- -- 83 (!) 22 100 % -- --  03/04/19 0200 120/69 -- -- 81 19 100 % -- --  03/04/19 0145 130/76 -- -- 81 19 100 % -- --  03/04/19 0130 (!) 126/57 -- -- 83 17 100 % -- --  03/04/19 0127 134/76 -- -- 83 20 100 % -- --  03/04/19 0127 -- 99.2 F (37.3 C) Oral -- -- -- -- --  03/04/19 0115 126/77 -- -- 87 (!) 21 100 % -- --  03/04/19 0100 (!) 115/102 -- -- 90 20 100 % -- --  03/04/19 0049 -- 98.7 F (37.1 C) Oral -- -- -- -- --  03/04/19 0045 (!) 126/101 -- -- 87 19 100 % -- --  03/04/19 0030 126/72 -- -- 83 (!) 26 100 % -- --  03/04/19 0015 135/77 -- -- (!) 119 (!) 22 100 % -- --  03/04/19 0000 139/84 -- -- 89 18 (!) 80 % -- --  03/03/19 2345 (!) 144/71 -- -- 97 (!) 21 100 % -- --  03/03/19 2330 133/74 -- --  87 (!) 25 100 % -- --  03/03/19 2315 126/75 -- -- 83 (!) 21 100 % -- --  03/03/19 2300 132/75 -- -- 88 (!) 22 100 % -- --  03/03/19 2250 -- -- -- 90 (!) 22 100 % -- --  03/03/19 2245 -- -- -- 91 -- 100 % -- --  03/03/19 1920 -- -- -- -- -- -- 5' 2"  (1.575 m) 151 lb (68.5 kg)  03/03/19 1904 (!) 114/91 (!) 101.6 F (38.7 C) Oral (!) 101 18 100 % -- --    General:  well-nourished in no acute distress.   Eyes:  no scleral icterus.   ENT:  There were no oropharyngeal lesions.  Neck was without thyromegaly.   Lymphatics:  Negative cervical, supraclavicular or axillary adenopathy.  Respiratory: lungs were clear bilaterally without wheezing or crackles.  Cardiovascular:  Regular  rate and rhythm, S1/S2, without murmur, rub or gallop.  There was no pedal edema.   GI:  abdomen was soft, flat, nontender, nondistended, without organomegaly.   Musculoskeletal:  no spinal tenderness of palpation of vertebral spine.  Skin: Petechiae noted.   Neuro exam was nonfocal. Patient was alert and oriented.  Attention was good.   Language was appropriate.  Mood was normal without depression.  Speech was not pressured.  Thought content was not tangential.    LABS:  Lab Results  Component Value Date   WBC 3.2 (L) 03/04/2019   HGB 5.9 (LL) 03/04/2019   HCT 18.4 (L) 03/04/2019   PLT 42 (L) 03/04/2019   GLUCOSE 141 (H) 03/04/2019   CHOL 166 09/26/2018   TRIG 109 03/03/2019   HDL 39 09/26/2018   LDLCALC 115 09/26/2018   ALT 43 03/04/2019   AST 122 (H) 03/04/2019   NA 137 03/04/2019   K 3.8 03/04/2019   CL 103 03/04/2019   CREATININE 0.75 03/04/2019   BUN 8 03/04/2019   CO2 21 (L) 03/04/2019   INR 1.3 (H) 03/03/2019   HGBA1C 6.1 (A) 02/26/2019   MICROALBUR 2.2 (H) 02/26/2019    DG Chest Portable 1 View  Result Date: 03/03/2019 CLINICAL DATA:  58 year old female with fever. EXAM: PORTABLE CHEST 1 VIEW COMPARISON:  Chest radiograph dated 10/28/2017. FINDINGS: The heart size and mediastinal contours are within normal limits. Both lungs are clear. The visualized skeletal structures are unremarkable. IMPRESSION: No active disease. Electronically Signed   By: Anner Crete M.D.   On: 03/03/2019 22:55   US Abdomen Limited RUQ  Result Date: 03/03/2019 CLINICAL DATA:  Abdominal pain for 1-2 weeks, history of non alcoholic cirrhosis EXAM: ULTRASOUND ABDOMEN LIMITED RIGHT UPPER QUADRANT COMPARISON:  Ultrasound 09/05/2016, CT abdomen pelvis 04/10/2018 FINDINGS: Gallbladder: No gallstones or wall thickening visualized. No sonographic Murphy sign noted by sonographer. Common bile duct: Diameter: 3.1 mm proximally, 2.7 mm distally.  Nondilated. Liver: Diffusely increased hepatic  echogenicity with loss of definition of the portal triads and diminished posterior through transmission compatible with hepatic steatosis. Mildly nodular hepatic surface contour. No focal lesions. Portal vein is patent on color Doppler imaging with normal direction of blood flow towards the liver. Other: None. IMPRESSION: Diffusely increased hepatic echogenicity compatible with intrinsic liver disease most commonly hepatic steatosis. The nodular surface contour of the liver is also suggestive of some underlying cirrhotic change compatible with patient's history. Otherwise unremarkable right upper quadrant ultrasound. Electronically Signed   By: Lovena Le M.D.   On: 03/03/2019 22:47    ASSESSMENT AND PLAN:  1.  Pancytopenia 2.  Cirrhosis  3.  Fever of unclear etiology 4.  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  She now has significant worsening of her pancytopenia.  Fibrinogen is normal making DIC less likely.  She does have evidence of hemolysis on her labs including an elevated total bilirubin, elevated LDH, and elevated reticulocyte count.  I will add on a haptoglobin and direct/indirect bilirubin to this morning's labs.  Peripheral blood smear is available for review. -Recommend platelet transfusion for platelet count less than 20,000 or active bleeding. -Recommend PRBC transfusion for hemoglobin less than 7. -Further recommendations per Dr. Marin Olp during rounds later today.  Thank you for this referral.  Mikey Bussing, DNP, AGPCNP-BC, AOCNP  ADDENDUM: I saw and examined Vicki [name].  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  I cannot explain her blood parameters any other way right now.  When I saw that her her blood counts were not all that bad just 2 weeks ago, something had to have changed.    When I looked at her blood smear, she clearly had schistocytes.  Again, I really cannot explain this any other  way.  Her reticulocyte count is 10%.  Her LDH is 1050.  I think we have to presume a diagnosis of TTP and then try to disprove it with a ADAMTS-13 level.  I know she does not have any renal involvement.  She is having temperatures.  I know she said she has had some boils on her skin.  There is no neurological changes I can see right now.  I think that TTP would be the worst diagnoses that we could overlook and not treat.  She is in great health.  I would like to avoid her having some complication from TTP like a stroke or a heart attack that could result in permanent problems.  I realize that this is quite complicated.  I had a long talk with Vicki [name].  She has a strong faith.  We had a good prayer session.  She appreciates my concern and my recommendations for her.  I told her that we have to "clean out" her blood with plasma.  She will need a temporary dialysis catheter.  I have already spoken to the dialysis unit.  I have already spoken to the blood bank.  The orders have been put in for the plasma exchange.  I would do a 1 volume plasma exchange daily for right now.  I think we can get started tomorrow on the plasma exchange.  We should be able to see if this is going to work by her LDH coming down by her reticulocyte count coming down and by her platelet count coming up.  I must admit that it is quite unusual to see such a low platelet count with TTP.  I realize that she has this NASH.  I just think that this is a separate problem.  I cannot explain any type of viral syndrome that could cause this problem.  I know that viruses can trigger TTP.  I do not see any collagen vascular issues.  Also any medications that she is on.  It probably will take 3 or 4 days before the ADAMTS-13 level to come back..  I spent over an hour with Vicki [name].  It clearly has taken a while to get everything arranged.  Hopefully we can get started tomorrow morning with plasma exchange.  Lattie Haw, MD  Jeneen Rinks 1:5-7

## 2019-03-04 NOTE — ED Notes (Signed)
Report attempted, RN to call back. 

## 2019-03-04 NOTE — Progress Notes (Addendum)
HPI: Vicki [name] is a 58 y.o. female with medical history significant of cirrhosis / fatty liver, DM2, HTN, recurrent skin boils who presented to the ED due to generalized body aches, fever at home and spontaneous bruising. Has followed with Dr. Irene Limbo hematology in the past year due to worsening pancytopenia.  Presented to the Ed last month due to fever attributed to abscesses in groin area.  ED Course: Tm 101.6.  HGB 6.1 down from 10.5 earlier this month, platelets are <5 down from 34 earlier this month.  She had been stable with HGB 11.3 and 39 back in Sept.  WBC today of 3.5, slightly up from 1.5 earlier this month.  Hemoccult neg and no h/o bleeding.  LDH 1049 and bili is 3.7.  AST 125 ALT 45.  Retic count increased.  03/04/19: Seen and examined.  Reports mild headache 4 out of 10 with no acute visual changes or focal deficits.  No obvious signs of active bleeding.  Diffuse petechiae on exam.  Transfused platelets with improvement of platelet count from <5K to >40K.  Hematology consulted, Dr. Marin Olp.  Tmax 101.6 overnight.  Cultures in process.  MRSA screening ordered and pending.  Started IV antibiotics empirically IV vancomycin and cefepime.  Continue to closely monitor for any acute changes.  Please refer to H&P dictated by my partner Dr. Alcario Drought on 03/04/2019 for further details of the assessment and plan.

## 2019-03-04 NOTE — ED Notes (Signed)
Called lab, and Reticulocytes will be added on with previous tubes already sent to lab.

## 2019-03-04 NOTE — Progress Notes (Signed)
Hgb 5.9, pt received platelets and plasma earlier last night, MD is aware, will continue to monitor, Thanks Arvella Nigh RN

## 2019-03-04 NOTE — H&P (Signed)
History and Physical    Nisha Dhami YKZ:[id number] DOB: [birth year] DOA: 03/03/2019  PCP: Lesleigh Noe, MD  Patient coming from: Home  I have personally briefly reviewed patient's old medical records in [place]  Chief Complaint: Body aches  HPI: Vicki [name] is a 58 y.o. female with medical history significant of cirrhosis / fatty liver, DM2, HTN.  Patient has been being followed for past couple months for a progressively worsening pancytopenia by Dr. Irene Limbo.  Looks like they thought it was just due to hepatosplenomegally and cirrhosis based on office notes from earlier this month.  On 11/24 patient had ED visit with fever attributed to abscesses of groin area.  Today presents to the ED with 2 day history of myalgias, cough, fatigue, fever.  No SOB, roommate did have multiple exposures to [place].   ED Course: Tm 101.6.  HGB 6.1 down from 10.5 earlier this month, platelets are <5 down from 34 earlier this month.  She had been stable with HGB 11.3 and 39 back in Sept.  WBC today of 3.5, slightly up from 1.5 earlier this month.  Hemoccult neg and no h/o bleeding.  LDH 1049 and bili is 3.7.  AST 125 ALT 45.  Retic count increased.  Ferritin 1134 up from 300 earlier this month.  Review of Systems: As per HPI, otherwise all review of systems negative.  Past Medical History:  Diagnosis Date  . Allergy   . Arthritis   . Boil   . 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 Diabetes ([place])    type 2  . Elevated liver enzymes   . Fatty liver   . Hyperlipemia   . Hypertension   . 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 PVC's (premature ventricular contractions)     Past Surgical History:  Procedure Laterality Date  . COLONOSCOPY  2014  . ECTOPIC PREGNANCY SURGERY  1990  . TONSILLECTOMY  1980  . UPPER GI ENDOSCOPY  08/2017   [facility]     reports that she has never smoked. She has never used smokeless tobacco. She  reports current alcohol use of about 3.0 standard drinks of alcohol per week. She reports that she does not use drugs.  Allergies  Allergen Reactions  . Metformin And Related Itching  . Sulfa Antibiotics Itching  . Sulfasalazine Itching    Family History  Problem Relation Age of Onset  . Hypertension Mother   . Diabetes Mother   . Heart attack Mother 40  . Asthma Mother        Childhood  . Arthritis Mother   . Heart disease Mother   . Hyperlipidemia Mother   . Hypertension Sister   . Diabetes Sister   . Asthma Sister 40       asthma attack cause of death  . Diabetes Brother   . Hypertension Brother   . Diabetes Brother   . Hypertension Brother   . Hypertension Maternal Grandmother   . Heart attack Maternal Grandfather 60  . Colon cancer Neg Hx      Prior to Admission medications   Medication Sig Start Date End Date Taking? Authorizing Provider  acetaminophen (TYLENOL) 500 MG tablet Take 500 mg by mouth as needed for pain.    [provider]  alum & mag hydroxide-simeth (MAALOX PLUS) 400-400-40 MG/5ML suspension Take by mouth every 6 (six) hours as needed for indigestion.    [provider]  cholecalciferol (VITAMIN D3) 25  MCG (1000 UT) tablet Take 1,000 Units by mouth daily.    [provider]  fluorometholone (FML) 0.1 % ophthalmic suspension Apply to eye.    [provider]  glucose blood test strip One touch ultra 11/11/15   [provider]  mupirocin ointment (BACTROBAN) 2 % Apply 1 application topically 2 (two) times daily. 01/29/19   Tasia Catchings, Amy V, PA-C  topiramate (TOPAMAX) 25 MG tablet Take one tablet at night for one week, then take 2 tablets at night for one week, then take 3 tablets at night. 04/09/18   Kathrynn Ducking, MD  traZODone (DESYREL) 50 MG tablet Take 50 mg by mouth at bedtime as needed for sleep.    [provider]  Turmeric POWD by Does not apply route. Pt takes 1-2 times weekly.    [provider]    Physical Exam: Vitals:   03/03/19 2330 03/03/19 2345 03/04/19 0000 03/04/19 0015  BP: 133/74 (!) 144/71 139/84 135/77  Pulse: 87 97 89 (!) 119  Resp: (!) 25 (!) 21 18 (!) 22  Temp:      TempSrc:      SpO2: 100% 100% (!) 80% 100%  Weight:      Height:        Constitutional: NAD, calm, comfortable Eyes: PERRL, lids and conjunctivae normal ENMT: Mucous membranes are moist. Posterior pharynx clear of any exudate or lesions.Normal dentition.  Neck: normal, supple, no masses, no thyromegaly Respiratory: clear to auscultation bilaterally, no wheezing, no crackles. Normal respiratory effort. No accessory muscle use.  Cardiovascular: Regular rate and rhythm, no murmurs / rubs / gallops. No extremity edema. 2+ pedal pulses. No carotid bruits.  Abdomen: no tenderness, no masses palpated. No hepatosplenomegaly. Bowel sounds positive.  Musculoskeletal: no clubbing / cyanosis. No joint deformity upper and lower extremities. Good ROM, no contractures. Normal muscle tone.  Skin: no rashes, lesions, ulcers. No induration Neurologic: CN 2-12 grossly intact. Sensation intact, DTR normal. Strength 5/5 in all 4.  Psychiatric: Normal judgment and insight. Alert and oriented x 3. Normal mood.    Labs on Admission: I have personally reviewed following labs and imaging studies  CBC: Recent Labs  Lab 03/03/19 1933 03/03/19 2315  WBC 2.8* 3.5*  NEUTROABS  --  1.7  HGB 6.1* 6.0*  HCT 18.3* 18.9*  MCV 100.5* 102.7*  PLT <5* <5*   Basic Metabolic Panel: Recent Labs  Lab 03/03/19 1933  NA 132*  K 3.8  CL 102  CO2 22  GLUCOSE 157*  BUN 10  CREATININE 0.91  CALCIUM 9.4   GFR: Estimated Creatinine Clearance: 60.4 mL/min (by C-G formula based on SCr of 0.91 mg/dL). Liver Function Tests: Recent Labs  Lab 03/03/19 1933  AST 125*  ALT 45*  ALKPHOS 55  BILITOT 3.7*  PROT 8.0  ALBUMIN 3.6   Recent Labs  Lab 03/03/19 1933  LIPASE 38   No results for input(s): AMMONIA in the  last 168 hours. Coagulation Profile: Recent Labs  Lab 03/03/19 2300  INR 1.3*   Cardiac Enzymes: No results for input(s): CKTOTAL, CKMB, CKMBINDEX, TROPONINI in the last 168 hours. BNP (last 3 results) No results for input(s): PROBNP in the last 8760 hours. HbA1C: No results for input(s): HGBA1C in the last 72 hours. CBG: No results for input(s): GLUCAP in the last 168 hours. Lipid Profile: Recent Labs    03/03/19 2136  TRIG 109   Thyroid Function Tests: No results for input(s): TSH, T4TOTAL, FREET4, T3FREE, THYROIDAB in  the last 72 hours. Anemia Panel: Recent Labs    03/03/19 2136 03/03/19 2202  FERRITIN 1,134*  --   RETICCTPCT  --  10.1*   Urine analysis:    Component Value Date/Time   COLORURINE DARK YELLOW 01/13/2017 1137   APPEARANCEUR CLEAR 01/13/2017 1137   LABSPEC 1.024 01/13/2017 1137   PHURINE 5.5 01/13/2017 1137   GLUCOSEU NEGATIVE 01/13/2017 1137   HGBUR NEGATIVE 01/13/2017 1137   KETONESUR TRACE (A) 01/13/2017 1137   PROTEINUR NEGATIVE 01/13/2017 1137    Radiological Exams on Admission: DG Chest Portable 1 View  Result Date: 03/03/2019 CLINICAL DATA:  58 year old female with fever. EXAM: PORTABLE CHEST 1 VIEW COMPARISON:  Chest radiograph dated 10/28/2017. FINDINGS: The heart size and mediastinal contours are within normal limits. Both lungs are clear. The visualized skeletal structures are unremarkable. IMPRESSION: No active disease. Electronically Signed   By: Anner Crete M.D.   On: 03/03/2019 22:55   US Abdomen Limited RUQ  Result Date: 03/03/2019 CLINICAL DATA:  Abdominal pain for 1-2 weeks, history of non alcoholic cirrhosis EXAM: ULTRASOUND ABDOMEN LIMITED RIGHT UPPER QUADRANT COMPARISON:  Ultrasound 09/05/2016, CT abdomen pelvis 04/10/2018 FINDINGS: Gallbladder: No gallstones or wall thickening visualized. No sonographic Murphy sign noted by sonographer. Common bile duct: Diameter: 3.1 mm proximally, 2.7 mm distally.  Nondilated. Liver:  Diffusely increased hepatic echogenicity with loss of definition of the portal triads and diminished posterior through transmission compatible with hepatic steatosis. Mildly nodular hepatic surface contour. No focal lesions. Portal vein is patent on color Doppler imaging with normal direction of blood flow towards the liver. Other: None. IMPRESSION: Diffusely increased hepatic echogenicity compatible with intrinsic liver disease most commonly hepatic steatosis. The nodular surface contour of the liver is also suggestive of some underlying cirrhotic change compatible with patient's history. Otherwise unremarkable right upper quadrant ultrasound. Electronically Signed   By: Lovena Le M.D.   On: 03/03/2019 22:47    EKG: Independently reviewed.  Assessment/Plan Principal Problem:   Acquired pancytopenia (HCC) Active Problems:   Controlled type 2 diabetes mellitus without complication (HCC)   Elevated liver enzymes   Liver cirrhosis secondary to NASH ([place])    1. Acquired pancytopenia - with worsening anemia and thrombocytopenia.  Looks hemolytic.  Also has fever. 1. DDX discussion: 1. Doesn't sound like TTP with normal mentation and renal function, doesn't look sick enough for DIC, wouldn't expect hemolytic anemia with ITP. 2. [employer] / MAS is a strong contender 3. 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 2. Dr. Marin Olp consulted: 1. Transfuse platelets 2. dont transfuse PRBC at the moment 3. Formal consult to follow in AM 3. COVID pending, though CXR clear and satting 100% on RA 4. ANC of 1700 so not neutropenic to need ABx for fever. 2. DM2 - 1. Continue diet control  DVT prophylaxis: SCDs Code Status: Full Family Communication: No family in room Disposition Plan: Home after admit Consults called: Dr. Marin Olp Admission status: Admit to inpatient  Severity of Illness: The appropriate patient status for this patient is INPATIENT. Inpatient status is judged to  be reasonable and necessary in order to provide the required intensity of service to ensure the patient's safety. The patient's presenting symptoms, physical exam findings, and initial radiographic and laboratory data in the context of their chronic comorbidities is felt to place them at high risk for further clinical deterioration. Furthermore, it is not anticipated that the patient will be medically stable for discharge from the hospital within 2 midnights of admission.  The following factors support the patient status of inpatient.   IP status due to worsening pancytopenia.  Need for platelet transfusion, and further work up as to cause.   * I certify that at the point of admission it is my clinical judgment that the patient will require inpatient hospital care spanning beyond 2 midnights from the point of admission due to high intensity of service, high risk for further deterioration and high frequency of surveillance required.*    GARDNER, JARED M. DO [facility]  How to contact the [facility] Attending or Consulting provider [place] or covering provider during after hours [place], for this patient?  1. Check the care team in [facility] and look for a) attending/consulting [facility] provider listed and b) the [facility] team listed 2. Log into [email or web address]  Amion Physician Scheduling and messaging for groups and whole hospitals  On call and physician scheduling software for group practices, residents, hospitalists and other medical providers for call, clinic, rotation and shift schedules. OnCall [employer] is a hospital-wide system for scheduling doctors and paging doctors on call. EasyPlot is for scientific plotting and data analysis.  [email or web address]  and use [place]'s universal password to access. If you do not have the password, please contact the hospital operator.  3. Locate the [facility] provider you are looking for under [facility] and page to a number that you can be directly reached. 4. If you  still have difficulty reaching the provider, please page the [facility] (Director on Call) for the Hospitalists listed on amion for assistance.  03/04/2019, 12:24 AM

## 2019-03-04 NOTE — Progress Notes (Addendum)
Went to start blood- Pre -blood vital signs, patient had a Temperature of 100.6 F.   Paged MD- will order Advil (no tylenol due to elevated liver function).  Wants to treat the temperature prior to giving blood

## 2019-03-04 NOTE — Progress Notes (Signed)
Pharmacy Antibiotic Note  Vicki [name] is a 58 y.o. female admitted on 03/03/2019 pm with generalized body aches, fever at home and spontaneous bruising. Worsening pancytopenia in the past year, followed by heme/onc.  Pharmacy has been consulted for Vancomcyin and Cefepim dosing for sepsis coverage.  Tmax 101.6.  Plan: Cefepime 2gm IV q8hrs Vancomycin 1250 mg IV Q 24 hrs.  Goal AUC 400-550. Expected AUC: 487 SCr used: 0.8 Follow renal function, culture data, clinical progress and antibiotic plans. Vanc levels prn.   Height: 5\' 2"  (157.5 cm) Weight: 145 lb 14.4 oz (66.2 kg) IBW/kg (Calculated) : 50.1  Temp (24hrs), Avg:99.7 F (37.6 C), Min:98.7 F (37.1 C), Max:101.6 F (38.7 C)  Recent Labs  Lab 03/03/19 1933 03/03/19 2315 03/03/19 2336 03/04/19 0427  WBC 2.8* 3.5*  --  3.2*  CREATININE 0.91  --   --  0.75  LATICACIDVEN  --   --  1.4  --     Estimated Creatinine Clearance: 67.5 mL/min (by C-G formula based on SCr of 0.75 mg/dL).    Allergies  Allergen Reactions  . Metformin And Related Itching  . Sulfa Antibiotics Itching  . Sulfasalazine Itching    Antimicrobials this admission: Vancomycin 12/28>> Cefepime 12/28 >>  Dose adjustments this admission:  n/a  Microbiology results:  12/28 blood x 2:  12/28 urine:   12/27 COVID: negative  12/27 Flu: negative  12/28  MRSA PCR:    Thank you for allowing pharmacy to be a part of this patient's care.  Arty Baumgartner, [place] Phone: [phone] 03/04/2019 11:17 AM

## 2019-03-04 NOTE — Progress Notes (Signed)
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  IR consulted for HD catheter placement for possible plasma exchange.  Will continue to closely monitor and treat as indicated.

## 2019-03-04 NOTE — ED Notes (Signed)
Unsuccessful blood draw for 5 am labs

## 2019-03-04 NOTE — H&P (Signed)
Chief Complaint: Patient was seen in consultation today for  Pancytopenia  Referring Physician(s): Dr. Pearletha Alfred  Supervising Physician: Arne Cleveland  Patient Status: [facility] - In-pt  History of Present Illness: Vicki [name] is a 58 y.o. 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 Team is concerned for possible TTP. Team is requesting  hemodialysis catheter for plasma pheresis.      Past Medical History:  Diagnosis Date  . Allergy   . Arthritis   . Boil   . 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 Diabetes ([place])    type 2  . Elevated liver enzymes   . Fatty liver   . Hyperlipemia   . Hypertension   . 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 PVC's (premature ventricular contractions)     Past Surgical History:  Procedure Laterality Date  . COLONOSCOPY  2014  . ECTOPIC PREGNANCY SURGERY  1990  . TONSILLECTOMY  1980  . UPPER GI ENDOSCOPY  08/2017   [facility]    Allergies: Metformin and related, Sulfa antibiotics, and Sulfasalazine  Medications: Prior to Admission medications   Medication Sig Start Date End Date Taking? Authorizing Provider  acetaminophen (TYLENOL) 500 MG tablet Take 500 mg by mouth as needed for pain.   Yes [provider]  alum & mag hydroxide-simeth (MAALOX PLUS) 400-400-40 MG/5ML suspension Take 10 mLs by mouth every 6 (six) hours as needed for indigestion.    Yes [provider]  cholecalciferol (VITAMIN D3) 25 MCG (1000 UT) tablet Take 1,000 Units by mouth daily.   Yes [provider]  dextromethorphan-guaiFENesin (MUCINEX DM) 30-600 MG 12hr tablet Take 2 tablets by mouth 2 (two) times daily as needed for cough.   Yes [provider]  fluorometholone (FML) 0.1 % ophthalmic suspension Place 1 drop into the left eye 4 (four) times daily.    Yes [provider]  Multiple  Vitamins-Minerals (MULTI FOR HER 50+) TABS Take 1 tablet by mouth daily.   Yes [provider]  mupirocin ointment (BACTROBAN) 2 % Apply 1 application topically 2 (two) times daily. 01/29/19  Yes Yu, Amy V, PA-C  NON FORMULARY Take 120 mLs by mouth daily. Beet juice   Yes [provider]  Pedialyte (PEDIALYTE) SOLN Take 240 mLs by mouth every 8 (eight) hours as needed (dehydration).   Yes [provider]  traZODone (DESYREL) 50 MG tablet Take 50 mg by mouth at bedtime as needed for sleep.   Yes [provider]  Turmeric POWD by Does not apply route. Pt takes 1-2 times weekly.   Yes [provider]  glucose blood test strip One touch ultra 11/11/15   [provider]  topiramate (TOPAMAX) 25 MG tablet Take one tablet at night for one week, then take 2 tablets at night for one week, then take 3 tablets at night. Patient not taking: Reported on 03/04/2019 04/09/18   Kathrynn Ducking, MD     Family History  Problem Relation Age of Onset  . Hypertension Mother   . Diabetes Mother   . Heart attack Mother 3  . Asthma Mother        Childhood  . Arthritis Mother   . Heart disease Mother   . Hyperlipidemia Mother   . Hypertension Sister   . Diabetes Sister   . Asthma Sister 40       asthma attack  cause of death  . Diabetes Brother   . Hypertension Brother   . Diabetes Brother   . Hypertension Brother   . Hypertension Maternal Grandmother   . Heart attack Maternal Grandfather 60  . Colon cancer Neg Hx     Social History   Socioeconomic History  . Marital status: Single    Spouse name: Not on file  . Number of children: 1  . Years of education: Not on file  . Highest education level: Some college, no degree  Occupational History    Employer: [employer]  Tobacco Use  . Smoking status: Never Smoker  . Smokeless tobacco: Never Used  Substance and Sexual Activity  . Alcohol use: Yes    Alcohol/week: 3.0 standard drinks    Types: 3 Standard  drinks or equivalent per week    Comment: trying to reduce  . Drug use: No  . Sexual activity: Not Currently    Birth control/protection: Condom  Other Topics Concern  . 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 Difficulty of Paying Living Expenses: Not on file  Food Insecurity:   . Worried About Charity fundraiser in the Last Year: Not on file  . Ran Out of Food in the Last Year: Not on file  Transportation Needs:   . Lack of Transportation (Medical): Not on file  . Lack of Transportation (Non-Medical): Not on file  Physical Activity:   . Days of Exercise per Week: Not on file  . Minutes of Exercise per Session: Not on file  Stress:   . Feeling of Stress : Not on file  Social Connections:   . Frequency of Communication with Friends and Family: Not on file  . Frequency of Social Gatherings with Friends and Family: Not on file  . Attends Religious Services: Not on file  . Active Member of Clubs or Organizations: Not on file  . Attends Archivist Meetings: Not on file  . Marital Status: Not on file    Review of Systems: A 12 point ROS discussed and pertinent positives are indicated in the HPI above.  All other systems are negative.  Review of Systems  Constitutional: Positive for fatigue. Negative for fever.  HENT: Negative for congestion.   Respiratory: Positive for cough and shortness of breath.   Gastrointestinal: Negative for abdominal pain, blood in stool, diarrhea and nausea.    Vital Signs: BP 113/65   Pulse 87    Temp 99.3 F (37.4 C) (Oral)   Resp (!) 22   Ht 5' 2"  (1.575 m)   Wt 145 lb 14.4 oz (66.2 kg)   LMP 04/30/2012   SpO2 100%   BMI 26.69 kg/m   Physical Exam Vitals and nursing note reviewed.  Constitutional:      Appearance: She is well-developed.  HENT:  Head: Normocephalic and atraumatic.  Eyes:     Conjunctiva/sclera: Conjunctivae normal.  Cardiovascular:     Rate and Rhythm: Regular rhythm. Tachycardia present.     Pulses: Normal pulses.     Heart sounds: Normal heart sounds.  Pulmonary:     Effort: Pulmonary effort is normal.     Breath sounds: Normal breath sounds.  Musculoskeletal:        General: Normal range of motion.     Cervical back: Normal range of motion.  Skin:    General: Skin is warm.  Neurological:     Mental Status: She is alert and oriented to person, place, and time.  Psychiatric:        Mood and Affect: Mood normal.        Behavior: Behavior normal.        Thought Content: Thought content normal.     Imaging: DG Chest Portable 1 View  Result Date: 03/03/2019 CLINICAL DATA:  58 year old female with fever. EXAM: PORTABLE CHEST 1 VIEW COMPARISON:  Chest radiograph dated 10/28/2017. FINDINGS: The heart size and mediastinal contours are within normal limits. Both lungs are clear. The visualized skeletal structures are unremarkable. IMPRESSION: No active disease. Electronically Signed   By: Anner Crete M.D.   On: 03/03/2019 22:55   US Abdomen Limited RUQ  Result Date: 03/03/2019 CLINICAL DATA:  Abdominal pain for 1-2 weeks, history of non alcoholic cirrhosis EXAM: ULTRASOUND ABDOMEN LIMITED RIGHT UPPER QUADRANT COMPARISON:  Ultrasound 09/05/2016, CT abdomen pelvis 04/10/2018 FINDINGS: Gallbladder: No gallstones or wall thickening visualized. No sonographic Murphy sign noted by sonographer. Common bile duct: Diameter: 3.1 mm proximally, 2.7 mm distally.  Nondilated. Liver: Diffusely increased hepatic echogenicity with loss of definition of the  portal triads and diminished posterior through transmission compatible with hepatic steatosis. Mildly nodular hepatic surface contour. No focal lesions. Portal vein is patent on color Doppler imaging with normal direction of blood flow towards the liver. Other: None. IMPRESSION: Diffusely increased hepatic echogenicity compatible with intrinsic liver disease most commonly hepatic steatosis. The nodular surface contour of the liver is also suggestive of some underlying cirrhotic change compatible with patient's history. Otherwise unremarkable right upper quadrant ultrasound. Electronically Signed   By: Lovena Le M.D.   On: 03/03/2019 22:47    Labs:  CBC: Recent Labs    02/13/19 1417 03/03/19 1933 03/03/19 2315 03/04/19 0427  WBC 1.5* 2.8* 3.5* 3.2*  HGB 10.5* 6.1* 6.0* 5.9*  HCT 30.9*  31.3* 18.3* 18.9* 18.4*  PLT 34* <5* <5* 42*    COAGS: Recent Labs    11/29/18 1005 02/13/19 1418 03/03/19 2300  INR 1.2 1.2 1.3*    BMP: Recent Labs    10/15/18 1415 02/13/19 1417 03/03/19 1933 03/04/19 0427  NA 138 140 132* 137  K 3.8 3.5 3.8 3.8  CL 108 106 102 103  CO2 20* 26 22 21*  GLUCOSE 128* 143* 157* 141*  BUN 9 7 10 8   CALCIUM 9.2 9.0 9.4 9.5  CREATININE 0.91 0.95 0.91 0.75  GFRNONAA >60 >60 >60 >60  GFRAA >60 >60 >60 >60    LIVER FUNCTION TESTS: Recent Labs    10/15/18 1415 02/13/19 1417 03/03/19 1933 03/04/19 0427 03/04/19 1511  BILITOT 1.0 1.0 3.7* 3.4* 2.6*  AST 69* 56* 125* 122*  --   ALT 45* 40 45* 43  --   ALKPHOS 98 108 55 56  --   PROT 8.2* 8.4* 8.0 7.7  --   ALBUMIN 3.8 3.8 3.6 3.5  --  Assessment and Plan:  58 y.o. female inpatient. 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 Team is concerned for TTP. Team is requesting  hemodialysis catheter for plasma pheresis.  IR performed bone marrow biopsy on 9.24.20 with hypercellular marrow with erythroid hyperplasia.  Of note, Patient  is being worked up at [facility] for possible liver transplant.  PLT 42 s/p PLT transfusion (initially 5 upon admission). Patient currently receiving PRBC current Hgb 5.9  Pertinent Imaging 12.27.20 - Chest xray shows no lines or drains  Okay to tentatively schedule for 12.29.20. This was communicated to team. Team instructed to: Keep Patient to be NPO after midnight Labs to be reviewed with IR Attending morning of procedure.   IR will call patient when ready.   Thank you for this interesting consult.  I greatly enjoyed meeting Vicki [name] and look forward to participating in their care.  A copy of this report was sent to the requesting provider on this date.  Risks and benefits discussed with the patient including, but not limited to bleeding, infection, vascular injury, pneumothorax which may require chest tube placement, air embolism or even death  All of the patient's questions were answered, patient is agreeable to proceed. Consent signed and in IR control room  Electronically Signed: Avel Peace, NP 03/04/2019, 5:05 PM   I spent a total of  40 minutes in face to face in clinical consultation, greater than 50% of which was counseling/coordinating care for hemodialysis catheter for plasma pheresis

## 2019-03-05 ENCOUNTER — Inpatient Hospital Stay (HOSPITAL_COMMUNITY): Payer: Federal, State, Local not specified - PPO

## 2019-03-05 HISTORY — PX: IR FLUORO GUIDE CV LINE RIGHT: IMG2283

## 2019-03-05 HISTORY — PX: IR US GUIDE VASC ACCESS RIGHT: IMG2390

## 2019-03-05 LAB — COMPREHENSIVE METABOLIC PANEL
ALT: 42 U/L (ref 0–44)
AST: 108 U/L — ABNORMAL HIGH (ref 15–41)
Albumin: 3.3 g/dL — ABNORMAL LOW (ref 3.5–5.0)
Alkaline Phosphatase: 56 U/L (ref 38–126)
Anion gap: 9 (ref 5–15)
BUN: 11 mg/dL (ref 6–20)
CO2: 19 mmol/L — ABNORMAL LOW (ref 22–32)
Calcium: 8.9 mg/dL (ref 8.9–10.3)
Chloride: 105 mmol/L (ref 98–111)
Creatinine, Ser: 0.81 mg/dL (ref 0.44–1.00)
GFR calc Af Amer: >60 mL/min (ref >60)
GFR calc non Af Amer: >60 mL/min (ref >60)
Glucose, Bld: 206 mg/dL — ABNORMAL HIGH (ref 70–99)
Potassium: 3.7 mmol/L (ref 3.5–5.1)
Sodium: 133 mmol/L — ABNORMAL LOW (ref 135–145)
Total Bilirubin: 3.6 mg/dL — ABNORMAL HIGH (ref 0.3–1.2)
Total Protein: 7.7 g/dL (ref 6.5–8.1)

## 2019-03-05 LAB — HEMOGLOBIN A1C
Hgb A1c MFr Bld: 4.3 % — ABNORMAL LOW (ref 4.8–5.6)
Mean Plasma Glucose: 76.71 mg/dL

## 2019-03-05 LAB — BPAM RBC
Blood Product Expiration Date: 202101252359
Blood Product Expiration Date: 202101252359
ISSUE DATE / TIME: 202012281503
ISSUE DATE / TIME: 202012281941
Unit Type and Rh: 5100
Unit Type and Rh: 5100

## 2019-03-05 LAB — PREPARE PLATELET PHERESIS: Unit division: 0

## 2019-03-05 LAB — LACTATE DEHYDROGENASE: LDH: 910 U/L — ABNORMAL HIGH (ref 98–192)

## 2019-03-05 LAB — CBC WITH DIFFERENTIAL/PLATELET
Abs Immature Granulocytes: 0.06 10*3/uL (ref 0.00–0.07)
Basophils Absolute: 0 10*3/uL (ref 0.0–0.1)
Basophils Relative: 0 %
Eosinophils Absolute: 0.1 10*3/uL (ref 0.0–0.5)
Eosinophils Relative: 2 %
HCT: 24.9 % — ABNORMAL LOW (ref 36.0–46.0)
Hemoglobin: 8.4 g/dL — ABNORMAL LOW (ref 12.0–15.0)
Immature Granulocytes: 2 %
Lymphocytes Relative: 43 %
Lymphs Abs: 1.1 10*3/uL (ref 0.7–4.0)
MCH: 32.9 pg (ref 26.0–34.0)
MCHC: 33.7 g/dL (ref 30.0–36.0)
MCV: 97.6 fL (ref 80.0–100.0)
Monocytes Absolute: 0.1 10*3/uL (ref 0.1–1.0)
Monocytes Relative: 5 %
Neutro Abs: 1.2 10*3/uL — ABNORMAL LOW (ref 1.7–7.7)
Neutrophils Relative %: 48 %
Platelets: 6 10*3/uL — CL (ref 150–400)
RBC: 2.55 MIL/uL — ABNORMAL LOW (ref 3.87–5.11)
RDW: 19.4 % — ABNORMAL HIGH (ref 11.5–15.5)
WBC: 2.6 10*3/uL — ABNORMAL LOW (ref 4.0–10.5)
nRBC: 8.7 % — ABNORMAL HIGH (ref 0.0–0.2)

## 2019-03-05 LAB — TYPE AND SCREEN
ABO/RH(D): O POS
Antibody Screen: NEGATIVE
Unit division: 0
Unit division: 0

## 2019-03-05 LAB — BASIC METABOLIC PANEL
Anion gap: 11 (ref 5–15)
BUN: 7 mg/dL (ref 6–20)
CO2: 22 mmol/L (ref 22–32)
Calcium: 9.5 mg/dL (ref 8.9–10.3)
Chloride: 103 mmol/L (ref 98–111)
Creatinine, Ser: 0.66 mg/dL (ref 0.44–1.00)
GFR calc Af Amer: >60 mL/min (ref >60)
GFR calc non Af Amer: >60 mL/min (ref >60)
Glucose, Bld: 246 mg/dL — ABNORMAL HIGH (ref 70–99)
Potassium: 3.8 mmol/L (ref 3.5–5.1)
Sodium: 136 mmol/L (ref 135–145)

## 2019-03-05 LAB — POCT I-STAT, CHEM 8
BUN: 8 mg/dL (ref 6–20)
Calcium, Ion: 1.27 mmol/L (ref 1.15–1.40)
Chloride: 101 mmol/L (ref 98–111)
Creatinine, Ser: 0.6 mg/dL (ref 0.44–1.00)
Glucose, Bld: 268 mg/dL — ABNORMAL HIGH (ref 70–99)
HCT: 25 % — ABNORMAL LOW (ref 36.0–46.0)
Hemoglobin: 8.5 g/dL — ABNORMAL LOW (ref 12.0–15.0)
Potassium: 3.8 mmol/L (ref 3.5–5.1)
Sodium: 137 mmol/L (ref 135–145)
TCO2: 23 mmol/L (ref 22–32)

## 2019-03-05 LAB — BPAM PLATELET PHERESIS
Blood Product Expiration Date: 202012290010
ISSUE DATE / TIME: 202012280104
Unit Type and Rh: 6200

## 2019-03-05 LAB — GLUCOSE, CAPILLARY
Glucose-Capillary: 282 mg/dL — ABNORMAL HIGH (ref 70–99)
Glucose-Capillary: 264 mg/dL — ABNORMAL HIGH (ref 70–99)

## 2019-03-05 LAB — RETICULOCYTES
Immature Retic Fract: 32.5 % — ABNORMAL HIGH (ref 2.3–15.9)
RBC.: 2.57 MIL/uL — ABNORMAL LOW (ref 3.87–5.11)
Retic Count, Absolute: 193 10*3/uL — ABNORMAL HIGH (ref 19.0–186.0)
Retic Ct Pct: 7.5 % — ABNORMAL HIGH (ref 0.4–3.1)

## 2019-03-05 LAB — HAPTOGLOBIN: Haptoglobin: <10 mg/dL — ABNORMAL LOW (ref 33–346)

## 2019-03-05 MED ORDER — LIDOCAINE HCL 1 % IJ SOLN
INTRAMUSCULAR | Status: AC
  Filled 2019-03-05: qty 20

## 2019-03-05 MED ORDER — LABETALOL HCL 5 MG/ML IV SOLN: 5.0000 mg | Freq: Four times a day (QID) | INTRAVENOUS | Status: DC | PRN

## 2019-03-05 MED ORDER — ANTICOAGULANT SODIUM CITRATE 4% (200MG/5ML) IV SOLN
5.0000 mL | Status: AC
  Administered 2019-03-05: 5 mL
  Filled 2019-03-05 (×2): qty 5

## 2019-03-05 MED ORDER — LIDOCAINE HCL 1 % IJ SOLN
INTRAMUSCULAR | Status: AC | PRN
  Administered 2019-03-05 – 2019-03-15 (×2): 10 mL

## 2019-03-05 MED ORDER — SODIUM CHLORIDE 0.9 % IV SOLN
INTRAVENOUS | Status: DC | PRN
  Administered 2019-03-05: 250 mL via INTRAVENOUS

## 2019-03-05 MED ORDER — CHLORHEXIDINE GLUCONATE CLOTH 2 % EX PADS
6.0000 | MEDICATED_PAD | Freq: Every day | CUTANEOUS | Status: DC
  Administered 2019-03-06 – 2019-03-14 (×9): 6 via TOPICAL

## 2019-03-05 MED ORDER — CALCIUM CARBONATE ANTACID 500 MG PO CHEW
CHEWABLE_TABLET | ORAL | Status: AC
  Filled 2019-03-05: qty 2

## 2019-03-05 MED ORDER — DIPHENHYDRAMINE HCL 25 MG PO CAPS: 25.0000 mg | ORAL_CAPSULE | Freq: Four times a day (QID) | ORAL | Status: DC | PRN

## 2019-03-05 MED ORDER — MIDAZOLAM HCL (PF) 10 MG/2ML IJ SOLN: 0.5000 mg | Freq: Once | INTRAMUSCULAR | Status: DC

## 2019-03-05 MED ORDER — SODIUM CHLORIDE 0.9 % IV SOLN
2.0000 g | INTRAVENOUS | Status: AC
  Administered 2019-03-05: 2 g via INTRAVENOUS
  Filled 2019-03-05 (×3): qty 20

## 2019-03-05 MED ORDER — SODIUM CHLORIDE 0.9% IV SOLUTION: Freq: Once | INTRAVENOUS | Status: AC

## 2019-03-05 MED ORDER — CALCIUM CARBONATE ANTACID 500 MG PO CHEW
2.0000 | CHEWABLE_TABLET | ORAL | Status: AC
  Administered 2019-03-05: 400 mg via ORAL

## 2019-03-05 MED ORDER — INSULIN ASPART 100 UNIT/ML ~~LOC~~ SOLN
0.0000 [IU] | Freq: Every day | SUBCUTANEOUS | Status: DC
  Administered 2019-03-05: 3 [IU] via SUBCUTANEOUS

## 2019-03-05 MED ORDER — MIDAZOLAM HCL 2 MG/2ML IJ SOLN
INTRAMUSCULAR | Status: AC
  Filled 2019-03-05: qty 2

## 2019-03-05 MED ORDER — ACETAMINOPHEN 325 MG PO TABS
650.0000 mg | ORAL_TABLET | ORAL | Status: DC | PRN
  Administered 2019-03-05: 650 mg via ORAL
  Filled 2019-03-05: qty 2

## 2019-03-05 MED ORDER — ACD FORMULA A 0.73-2.45-2.2 GM/100ML VI SOLN
Status: AC
  Filled 2019-03-05: qty 500

## 2019-03-05 MED ORDER — METHYLPREDNISOLONE SODIUM SUCC 125 MG IJ SOLR
80.0000 mg | Freq: Two times a day (BID) | INTRAMUSCULAR | Status: DC
  Administered 2019-03-05 – 2019-03-06 (×3): 80 mg via INTRAVENOUS
  Filled 2019-03-05 (×3): qty 2

## 2019-03-05 MED ORDER — MIDAZOLAM HCL 2 MG/2ML IJ SOLN
0.5000 mg | Freq: Once | INTRAMUSCULAR | Status: AC
  Administered 2019-03-05: 0.5 mg via INTRAVENOUS

## 2019-03-05 MED ORDER — ACD FORMULA A 0.73-2.45-2.2 GM/100ML VI SOLN
500.0000 mL | Status: DC
  Administered 2019-03-05: 500 mL via INTRAVENOUS
  Filled 2019-03-05 (×2): qty 500

## 2019-03-05 MED ORDER — INSULIN ASPART 100 UNIT/ML ~~LOC~~ SOLN
0.0000 [IU] | Freq: Three times a day (TID) | SUBCUTANEOUS | Status: DC
  Administered 2019-03-05: 5 [IU] via SUBCUTANEOUS
  Administered 2019-03-06: 3 [IU] via SUBCUTANEOUS

## 2019-03-05 MED ORDER — HEPARIN SODIUM (PORCINE) 1000 UNIT/ML IJ SOLN
INTRAMUSCULAR | Status: AC
  Administered 2019-03-05: 2.6 mL
  Filled 2019-03-05: qty 1

## 2019-03-05 NOTE — Progress Notes (Addendum)
PROGRESS NOTE  Vicki [name] MVH:[id number] DOB: [birth year] DOA: 03/03/2019 PCP: Lesleigh Noe, MD  HPI/Recap of past 24 hours: HPI: [name] is a 58 y.o. female with medical history significant of  nonalcoholic cirrhosis/ fatty liver, pancytopenia, DM2, HTN, recurrent skin boils who presented to the ED due to generalized body aches, fever at home and spontaneous bruising. Has followed with Dr. Irene Limbo hematology in the past year due to worsening pancytopenia.    ED Course: Tm 101.6.  HGB 6.1, platelets <5, WBC today of 3.5, LDH 1049 and bili is 3.7, AST 125 ALT 45.  Retic count increased.  Seen by hematology Dr. 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  HD catheter placement plannerd and will start plasma exchange on 03/05/2019.  03/05/19: Seen and examined.  No neurological symptoms.  No obvious signs of bleeding.  She has no new complaints.  Will start plasma exchange today.   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 Marin Olp, ADAMTS-13 ordered and pending. Right IJ temporary HD catheter placement on 03/05/2019, will start plasma exchange. Received a dose of IV Solu-Medrol, scheduled 80 mg twice daily.  Pancytopenia Transfuse to maintain hemoglobin greater than 7 Transfuse to maintain platelet count greater than 20,000. Avoid NSAIDs in the setting of low platelets  Fever of unknown source Presented with T-max 101.6 Blood cultures negative to date, continue to follow cultures. Urine culture no growth final Started on cefepime and vancomycin empirically  Type 2 diabetes with hyperglycemia Exacerbated by steroid use Start sensitive insulin sliding scale Obtain hemoglobin A1c  Transient  elevated blood pressure suspect in the setting of volume expansion from multiple blood products transfusion and steroid use Not on antihypertensive medications at home IV labetalol 5 mg every 6 hours as needed for SBP greater than 170. Continue to monitor vital signs.  Cirrhosis, non alcoholic  Avoid hepatotoxins as possible    Code Status: Full code  Family Communication: None at bedside.  Disposition Plan: Patient is currently not appropriate for discharge at this time due to ongoing treatment for suspected TTP.  Patient requires at least 2 midnights for further evaluation and treatment of present condition.   Consultants:  Hematology/oncology Dr. Marin Olp.  Interventional radiology Dr. Earleen Newport.  Procedures:  Right IJ temporary HD catheter placement by interventional radiology on 03/05/2019.  Antimicrobials:  IV vancomycin  DVT prophylaxis:  .  SCDs.   Objective: Vitals:   03/05/19 0532 03/05/19 0606 03/05/19 0656 03/05/19 0837  BP: 114/76 119/69 127/78 (!) 141/84  Pulse: 81 85 90   Resp: 20 20 20    Temp: 98.7 F (37.1 C) 99.2 F (37.3 C) 98.8 F (37.1 C) 98.8 F (37.1 C)  TempSrc: Oral Oral Oral Oral  SpO2: 98% 100% 99% 99%  Weight:      Height:        Intake/Output Summary (Last 24 hours) at 03/05/2019 0934 Last data filed at 03/05/2019 0920 Gross per 24 hour  Intake 2189.37 ml  Output 1975 ml  Net 214.37 ml   Filed Weights   03/03/19 1920 03/04/19 0407 03/05/19 0302  Weight: 68.5 kg 66.2 kg 67 kg    Exam:  . General: 58 y.o. year-old female well developed well nourished in no acute distress.  Alert and oriented x3. . Cardiovascular:  Regular rate and rhythm with no rubs or gallops.  No thyromegaly or JVD noted. [name] Respiratory: Clear to auscultation with no wheezes or rales. Good inspiratory effort. . Abdomen: Soft nontender nondistended with normal bowel sounds x4 quadrants. . Musculoskeletal: No lower extremity edema. 2/4 pulses in all 4  extremities. . Skin: Petechiae noted in lower extremities and upper extremities. [name] Psychiatry: Mood is appropriate for condition and setting   Data Reviewed: CBC: Recent Labs  Lab 03/03/19 1933 03/03/19 2315 03/04/19 0427 03/05/19 0115  WBC 2.8* 3.5* 3.2* 2.6*  NEUTROABS  --  1.7 1.5* 1.2*  HGB 6.1* 6.0* 5.9* 8.4*  HCT 18.3* 18.9* 18.4* 24.9*  MCV 100.5* 102.7* 101.7* 97.6  PLT <5* <5* 42* 6*   Basic Metabolic Panel: Recent Labs  Lab 03/03/19 1933 03/04/19 0427 03/05/19 0115  NA 132* 137 133*  K 3.8 3.8 3.7  CL 102 103 105  CO2 22 21* 19*  GLUCOSE 157* 141* 206*  BUN 10 8 11   CREATININE 0.91 0.75 0.81  CALCIUM 9.4 9.5 8.9   GFR: Estimated Creatinine Clearance: 67.2 mL/min (by C-G formula based on SCr of 0.81 mg/dL). Liver Function Tests: Recent Labs  Lab 03/03/19 1933 03/04/19 0427 03/04/19 1511 03/05/19 0115  AST 125* 122*  --  108*  ALT 45* 43  --  42  ALKPHOS 55 56  --  56  BILITOT 3.7* 3.4* 2.6* 3.6*  PROT 8.0 7.7  --  7.7  ALBUMIN 3.6 3.5  --  3.3*   Recent Labs  Lab 03/03/19 1933  LIPASE 38   No results for input(s): AMMONIA in the last 168 hours. Coagulation Profile: Recent Labs  Lab 03/03/19 2300  INR 1.3*   Cardiac Enzymes: No results for input(s): CKTOTAL, CKMB, CKMBINDEX, TROPONINI in the last 168 hours. BNP (last 3 results) No results for input(s): PROBNP in the last 8760 hours. HbA1C: No results for input(s): HGBA1C in the last 72 hours. CBG: No results for input(s): GLUCAP in the last 168 hours. Lipid Profile: Recent Labs    03/03/19 2136  TRIG 109   Thyroid Function Tests: No results for input(s): TSH, T4TOTAL, FREET4, T3FREE, THYROIDAB in the last 72 hours. Anemia Panel: Recent Labs    03/03/19 2136 03/03/19 2202 03/05/19 0115  FERRITIN 1,134*  --   --   RETICCTPCT  --  10.1* 7.5*   Urine analysis:    Component Value Date/Time   COLORURINE YELLOW 03/04/2019 0024   APPEARANCEUR CLEAR 03/04/2019 0024   LABSPEC  1.005 03/04/2019 0024   PHURINE 7.0 03/04/2019 0024   GLUCOSEU NEGATIVE 03/04/2019 0024   HGBUR MODERATE (A) 03/04/2019 0024   BILIRUBINUR NEGATIVE 03/04/2019 0024   KETONESUR NEGATIVE 03/04/2019 0024   PROTEINUR NEGATIVE 03/04/2019 0024   NITRITE NEGATIVE 03/04/2019 0024   LEUKOCYTESUR NEGATIVE 03/04/2019 0024   Sepsis Labs: @LABRCNTIP (procalcitonin:4,lacticidven:4)  ) Recent Results (from the past 240 hour(s))  Blood Culture (routine x 2)     Status: None (Preliminary result)   Collection Time: 03/03/19 10:00 PM   Specimen: BLOOD  Result Value Ref Range Status   Specimen Description BLOOD RIGHT ANTECUBITAL  Final   Special Requests   Final    BOTTLES DRAWN AEROBIC AND ANAEROBIC Blood Culture adequate volume   Culture   Final    NO GROWTH < 24 HOURS Performed at [facility], [place] [address] [place], [place] [address]    Report Status PENDING  Incomplete  Blood Culture (routine x 2)  Status: None (Preliminary result)   Collection Time: 03/03/19 10:38 PM   Specimen: BLOOD  Result Value Ref Range Status   Specimen Description BLOOD LEFT ANTECUBITAL  Final   Special Requests   Final    BOTTLES DRAWN AEROBIC AND ANAEROBIC Blood Culture results may not be optimal due to an excessive volume of blood received in culture bottles   Culture   Final    NO GROWTH < 24 HOURS Performed at [facility], [place] [address] [place], [place] [address]    Report Status PENDING  Incomplete  Respiratory Panel by RT PCR (Flu A&B, Covid) - Nasopharyngeal Swab     Status: None   Collection Time: 03/03/19 11:19 PM   Specimen: Nasopharyngeal Swab  Result Value Ref Range Status   SARS Coronavirus 2 by RT PCR NEGATIVE NEGATIVE Final    Comment: (NOTE) SARS-CoV-2 target nucleic acids are NOT DETECTED. The SARS-CoV-2 RNA is generally detectable in upper respiratoy specimens during the acute phase of infection. The lowest concentration of SARS-CoV-2 viral copies this assay can detect  is 131 copies/mL. A negative result does not preclude SARS-Cov-2 infection and should not be used as the sole basis for treatment or other patient management decisions. A negative result may occur with  improper specimen collection/handling, submission of specimen other than nasopharyngeal swab, presence of viral mutation(s) within the areas targeted by this assay, and inadequate number of viral copies (<131 copies/mL). A negative result must be combined with clinical observations, patient history, and epidemiological information. The expected result is Negative. Fact Sheet for Patients:  PinkCheek.be Fact Sheet for Healthcare Providers:  GravelBags.it This test is not yet ap proved or cleared by the [place] FDA and  has been authorized for detection and/or diagnosis of SARS-CoV-2 by FDA under an Emergency Use Authorization (EUA). This EUA will remain  in effect (meaning this test can be used) for the duration of the COVID-19 declaration under Section 564(b)(1) of the Act, 21 U.S.C. section 360bbb-3(b)(1), unless the authorization is terminated or revoked sooner.    Influenza A by PCR NEGATIVE NEGATIVE Final   Influenza B by PCR NEGATIVE NEGATIVE Final    Comment: (NOTE) The Xpert Xpress SARS-CoV-2/FLU/RSV assay is intended as an aid in  the diagnosis of influenza from Nasopharyngeal swab specimens and  should not be used as a sole basis for treatment. Nasal washings and  aspirates are unacceptable for Xpert Xpress SARS-CoV-2/FLU/RSV  testing. Fact Sheet for Patients: PinkCheek.be Fact Sheet for Healthcare Providers: GravelBags.it This test is not yet approved or cleared by the [place] FDA and  has been authorized for detection and/or diagnosis of SARS-CoV-2 by  FDA under an Emergency Use Authorization (EUA). This EUA will remain  in effect (meaning this  test can be used) for the duration of the  Covid-19 declaration under Section 564(b)(1) of the Act, 21  U.S.C. section 360bbb-3(b)(1), unless the authorization is  terminated or revoked. Performed at [facility], [place] [address] [place], [place] [address]   Urine culture     Status: None   Collection Time: 03/04/19 12:24 AM   Specimen: Urine, Random  Result Value Ref Range Status   Specimen Description URINE, RANDOM  Final   Special Requests NONE  Final   Culture   Final    NO GROWTH Performed at [facility], [place] [address], [place], [place] [address]    Report Status 03/04/2019 FINAL  Final  MRSA PCR Screening  Status: None   Collection Time: 03/04/19 12:18 PM   Specimen: Nasal Mucosa; Nasopharyngeal  Result Value Ref Range Status   MRSA by PCR NEGATIVE NEGATIVE Final    Comment:        The GeneXpert MRSA Assay (FDA approved for NASAL specimens only), is one component of a comprehensive MRSA colonization surveillance program. It is not intended to diagnose MRSA infection nor to guide or monitor treatment for MRSA infections. Performed at [facility], [place] [address], [place], [place] [address]       Studies: No results found.  Scheduled Meds: . sodium chloride   Intravenous Once  . methylPREDNISolone (SOLU-MEDROL) injection  80 mg Intravenous Q12H  . sodium chloride flush  3 mL Intravenous Once    Continuous Infusions: . sodium chloride 250 mL (03/05/19 0058)  . vancomycin Stopped (03/04/19 1340)     LOS: 1 day     Kayleen Memos, MD [facility] Pager [phone]  If 7PM-7AM, please contact night-coverage [email or web address] Password [facility] 03/05/2019, 9:34 AM

## 2019-03-05 NOTE — Progress Notes (Signed)
Ms. Parrott is feeling okay this morning.  She was given platelets this morning.  Platelet count dropped down to 6000.  She will start her plasma exchange today.  Her labs show her LDH to be 950.  Her total bilirubin is 3.6.  Her hemoglobin is8.4, after receiving 2 units of blood yesterday. reticulocyte count is still on the high side.  She is going for her dialysis catheter this morning.  Again, I have to worry about this being TTP.  I really will not know for sure until we get back the ADAMTS-13 level.  This will take several days.  She has had no headache.  She still has had low-grade temperature.  Her cultures were all negative.  She is on antibiotics.  There is no obvious bleeding.  We will go ahead and start the plasma exchange today.  She will have this daily for at least 7 days.  I hate that we had to do this over the [place] holiday.  Again we have to await the result of the ADAMTS-13 level.  We will monitor her labs daily.  I think we will know about the diagnosis with respect to her platelets and with respect to her LDH coming down.  Would be interested see how her temperatures go.  I would not give her nonsteroidals for the temperature.  I will put her on some steroids of her right now.  This may help a little bit if she does have TTP.  Again this is incredibly complex.  She has a lot of underlying health issues that are at "play."  I does want to make sure that we are aggressive and make sure that we treat the worst possibility for right now until we know better what is going on.  Lattie Haw, MD  Psalm 34:4

## 2019-03-05 NOTE — Procedures (Signed)
Interventional Radiology Procedure Note  Procedure: Placement of a right IJ temp HD catheter/trialysis. (thrombocytopenia today). Tip is positioned at the superior cavoatrial junction and catheter is ready for immediate use.  May be converted if need be.  Complications: None  Recommendations:  - Ok to use - Do not submerge - Routine line care   Signed,  Dulcy Fanny. Earleen Newport, DO

## 2019-03-05 NOTE — Progress Notes (Signed)
Updated patient's daughter Artemio Aly via phone.  All questions answered.

## 2019-03-05 NOTE — Progress Notes (Signed)
Boil right groin and left groin  Nodule at arch on right foot  Patient expressed concerns about taking steroid solumedrol given prior experiences describing itching and heart racing. Upon further inquiry, patient unsure about taking it at this time. [phone] called [place] office to inform of patient concerns, left message on nurse line. Awaiting callback   Paged [place] Crane at [phone]. Pt. concerns about solumedrol (causes hR increase), med held.paged [facility] office; no callback yet.   Upon callback, informed Nevada Crane of patient's concerns, [place] Crane spoke with patient about concerns. Patient then stated ok to take solumedrol; medication administred  Left unit for IR at 1001, returned at 1051.  Site assessed upon return, surgical tape clean dry and intact without drainage. Reassessed at 1123 no evidence of bleeding.   Received call from HD at 1154. Confirmed HD cath now in place and orders being placed for plasma exchange.   Report given to Ronny Bacon at 1329 in HD. Confirmed will do consent in HD.   Left floor for HD at 1356.  Paged Hall at 1356 Pt. BP elevated 141/84 @ 0837 and 147/86 @ 1106.Leaving for plasma exchange now, but plan to treat HTN? Upon callback, [place] Crane explained increased BP due to blood volume and indicated conservative management of BP.   Received call from patient's daughter at 14 inquiring if patient in procedure, informed patient currently in procedure.   Received report 9741 from HD [place].   1720 returned to unit from HD, sister Rollene Fare at bedside. A&Ox4.

## 2019-03-05 NOTE — Progress Notes (Addendum)
CRITICAL VALUE ALERT  Critical Value:  Platelets 6  Date & Time Notied:  03/05/2019 0155  Provider Notified: Sharlet Salina [facility]  Orders Received/Actions taken: transfuse 2unit of platelets.

## 2019-03-06 ENCOUNTER — Encounter (HOSPITAL_COMMUNITY): Payer: Self-pay | Admitting: Radiology

## 2019-03-06 LAB — BPAM PLATELET PHERESIS
Blood Product Expiration Date: 202012300247
Blood Product Expiration Date: 202012312359
ISSUE DATE / TIME: 202012290311
ISSUE DATE / TIME: 202012290535
Unit Type and Rh: 6200
Unit Type and Rh: 6200

## 2019-03-06 LAB — BASIC METABOLIC PANEL
Anion gap: 9 (ref 5–15)
BUN: 11 mg/dL (ref 6–20)
CO2: 23 mmol/L (ref 22–32)
Calcium: 9.5 mg/dL (ref 8.9–10.3)
Chloride: 101 mmol/L (ref 98–111)
Creatinine, Ser: 0.8 mg/dL (ref 0.44–1.00)
GFR calc Af Amer: >60 mL/min (ref >60)
GFR calc non Af Amer: >60 mL/min (ref >60)
Glucose, Bld: 355 mg/dL — ABNORMAL HIGH (ref 70–99)
Potassium: 3.5 mmol/L (ref 3.5–5.1)
Sodium: 133 mmol/L — ABNORMAL LOW (ref 135–145)

## 2019-03-06 LAB — COMPREHENSIVE METABOLIC PANEL
ALT: 31 U/L (ref 0–44)
AST: 62 U/L — ABNORMAL HIGH (ref 15–41)
Albumin: 3.5 g/dL (ref 3.5–5.0)
Alkaline Phosphatase: 55 U/L (ref 38–126)
Anion gap: 10 (ref 5–15)
BUN: 14 mg/dL (ref 6–20)
CO2: 23 mmol/L (ref 22–32)
Calcium: 9.7 mg/dL (ref 8.9–10.3)
Chloride: 102 mmol/L (ref 98–111)
Creatinine, Ser: 0.82 mg/dL (ref 0.44–1.00)
GFR calc Af Amer: >60 mL/min (ref >60)
GFR calc non Af Amer: >60 mL/min (ref >60)
Glucose, Bld: 276 mg/dL — ABNORMAL HIGH (ref 70–99)
Potassium: 3.7 mmol/L (ref 3.5–5.1)
Sodium: 135 mmol/L (ref 135–145)
Total Bilirubin: 2.5 mg/dL — ABNORMAL HIGH (ref 0.3–1.2)
Total Protein: 6.9 g/dL (ref 6.5–8.1)

## 2019-03-06 LAB — GLUCOSE, CAPILLARY
Glucose-Capillary: 244 mg/dL — ABNORMAL HIGH (ref 70–99)
Glucose-Capillary: 304 mg/dL — ABNORMAL HIGH (ref 70–99)
Glucose-Capillary: 251 mg/dL — ABNORMAL HIGH (ref 70–99)

## 2019-03-06 LAB — PREPARE PLATELET PHERESIS
Unit division: 0
Unit division: 0

## 2019-03-06 LAB — POCT I-STAT, CHEM 8
BUN: 13 mg/dL (ref 6–20)
Calcium, Ion: 1.23 mmol/L (ref 1.15–1.40)
Chloride: 99 mmol/L (ref 98–111)
Creatinine, Ser: 0.7 mg/dL (ref 0.44–1.00)
Glucose, Bld: 359 mg/dL — ABNORMAL HIGH (ref 70–99)
HCT: 25 % — ABNORMAL LOW (ref 36.0–46.0)
Hemoglobin: 8.5 g/dL — ABNORMAL LOW (ref 12.0–15.0)
Potassium: 3.4 mmol/L — ABNORMAL LOW (ref 3.5–5.1)
Sodium: 136 mmol/L (ref 135–145)
TCO2: 25 mmol/L (ref 22–32)

## 2019-03-06 LAB — THERAPEUTIC PLASMA EXCHANGE (BLOOD BANK)
Plasma Exchange: 2523
Plasma volume needed: 2522
Unit division: 0
Unit division: 0
Unit division: 0
Unit division: 0
Unit division: 0
Unit division: 0
Unit division: 0
Unit division: 0

## 2019-03-06 LAB — CBC WITH DIFFERENTIAL/PLATELET
Abs Immature Granulocytes: 0.04 10*3/uL (ref 0.00–0.07)
Basophils Absolute: 0 10*3/uL (ref 0.0–0.1)
Basophils Relative: 0 %
Eosinophils Absolute: 0 10*3/uL (ref 0.0–0.5)
Eosinophils Relative: 0 %
HCT: 27.8 % — ABNORMAL LOW (ref 36.0–46.0)
Hemoglobin: 9.5 g/dL — ABNORMAL LOW (ref 12.0–15.0)
Immature Granulocytes: 1 %
Lymphocytes Relative: 27 %
Lymphs Abs: 1.2 10*3/uL (ref 0.7–4.0)
MCH: 33.5 pg (ref 26.0–34.0)
MCHC: 34.2 g/dL (ref 30.0–36.0)
MCV: 97.9 fL (ref 80.0–100.0)
Monocytes Absolute: 0.2 10*3/uL (ref 0.1–1.0)
Monocytes Relative: 4 %
Neutro Abs: 2.9 10*3/uL (ref 1.7–7.7)
Neutrophils Relative %: 68 %
Platelets: 28 10*3/uL — CL (ref 150–400)
RBC: 2.84 MIL/uL — ABNORMAL LOW (ref 3.87–5.11)
RDW: 20.3 % — ABNORMAL HIGH (ref 11.5–15.5)
WBC: 4.3 10*3/uL (ref 4.0–10.5)
nRBC: 3.5 % — ABNORMAL HIGH (ref 0.0–0.2)

## 2019-03-06 LAB — RETICULOCYTES
Immature Retic Fract: 31.9 % — ABNORMAL HIGH (ref 2.3–15.9)
RBC.: 2.88 MIL/uL — ABNORMAL LOW (ref 3.87–5.11)
Retic Count, Absolute: 219.5 10*3/uL — ABNORMAL HIGH (ref 19.0–186.0)
Retic Ct Pct: 7.6 % — ABNORMAL HIGH (ref 0.4–3.1)

## 2019-03-06 LAB — LACTATE DEHYDROGENASE: LDH: 525 U/L — ABNORMAL HIGH (ref 98–192)

## 2019-03-06 MED ORDER — ACD FORMULA A 0.73-2.45-2.2 GM/100ML VI SOLN
500.0000 mL | Status: DC
  Administered 2019-03-06: 500 mL via INTRAVENOUS

## 2019-03-06 MED ORDER — SODIUM CHLORIDE 0.9 % IV SOLN
2.0000 g | Freq: Once | INTRAVENOUS | Status: AC
  Administered 2019-03-06: 2 g via INTRAVENOUS
  Filled 2019-03-06: qty 20

## 2019-03-06 MED ORDER — CALCIUM CARBONATE ANTACID 500 MG PO CHEW
2.0000 | CHEWABLE_TABLET | ORAL | Status: AC
  Administered 2019-03-06: 400 mg via ORAL

## 2019-03-06 MED ORDER — ACETAMINOPHEN 325 MG PO TABS
650.0000 mg | ORAL_TABLET | Freq: Four times a day (QID) | ORAL | Status: DC | PRN
  Administered 2019-03-06 – 2019-03-15 (×3): 650 mg via ORAL

## 2019-03-06 MED ORDER — DIPHENHYDRAMINE HCL 25 MG PO CAPS: 25.0000 mg | ORAL_CAPSULE | Freq: Four times a day (QID) | ORAL | Status: DC | PRN

## 2019-03-06 MED ORDER — METHYLPREDNISOLONE SODIUM SUCC 125 MG IJ SOLR
80.0000 mg | INTRAMUSCULAR | Status: DC
  Administered 2019-03-07 – 2019-03-08 (×2): 80 mg via INTRAVENOUS
  Filled 2019-03-06 (×2): qty 2

## 2019-03-06 MED ORDER — INSULIN GLARGINE 100 UNIT/ML ~~LOC~~ SOLN
10.0000 [IU] | Freq: Every day | SUBCUTANEOUS | Status: DC
  Administered 2019-03-06: 10 [IU] via SUBCUTANEOUS
  Filled 2019-03-06 (×2): qty 0.1

## 2019-03-06 MED ORDER — ACETAMINOPHEN 325 MG PO TABS: 650.0000 mg | ORAL_TABLET | ORAL | Status: DC | PRN

## 2019-03-06 MED ORDER — INSULIN ASPART 100 UNIT/ML ~~LOC~~ SOLN
0.0000 [IU] | Freq: Three times a day (TID) | SUBCUTANEOUS | Status: DC
  Administered 2019-03-06: 11 [IU] via SUBCUTANEOUS
  Administered 2019-03-07: 8 [IU] via SUBCUTANEOUS
  Administered 2019-03-07: 11 [IU] via SUBCUTANEOUS
  Administered 2019-03-07: 2 [IU] via SUBCUTANEOUS
  Administered 2019-03-08 – 2019-03-09 (×2): 3 [IU] via SUBCUTANEOUS
  Administered 2019-03-09: 8 [IU] via SUBCUTANEOUS
  Administered 2019-03-09: 3 [IU] via SUBCUTANEOUS
  Administered 2019-03-10: 2 [IU] via SUBCUTANEOUS
  Administered 2019-03-10 (×2): 5 [IU] via SUBCUTANEOUS
  Administered 2019-03-11: 3 [IU] via SUBCUTANEOUS
  Administered 2019-03-11: 2 [IU] via SUBCUTANEOUS
  Administered 2019-03-12 (×2): 3 [IU] via SUBCUTANEOUS
  Administered 2019-03-13: 5 [IU] via SUBCUTANEOUS
  Administered 2019-03-14: 3 [IU] via SUBCUTANEOUS
  Administered 2019-03-14: 2 [IU] via SUBCUTANEOUS
  Administered 2019-03-15: 3 [IU] via SUBCUTANEOUS
  Administered 2019-03-15: 13:00:00 2 [IU] via SUBCUTANEOUS

## 2019-03-06 MED ORDER — ACD FORMULA A 0.73-2.45-2.2 GM/100ML VI SOLN
Status: AC
  Filled 2019-03-06: qty 500

## 2019-03-06 MED ORDER — CALCIUM CARBONATE ANTACID 500 MG PO CHEW
CHEWABLE_TABLET | ORAL | Status: AC
  Filled 2019-03-06: qty 2

## 2019-03-06 MED ORDER — INSULIN ASPART 100 UNIT/ML ~~LOC~~ SOLN
0.0000 [IU] | Freq: Every day | SUBCUTANEOUS | Status: DC
  Administered 2019-03-06: 3 [IU] via SUBCUTANEOUS
  Administered 2019-03-08: 4 [IU] via SUBCUTANEOUS

## 2019-03-06 MED ORDER — ANTICOAGULANT SODIUM CITRATE 4% (200MG/5ML) IV SOLN
5.0000 mL | Freq: Once | Status: AC
  Administered 2019-03-06: 5 mL
  Filled 2019-03-06: qty 5

## 2019-03-06 NOTE — Progress Notes (Signed)
I very much appreciate everybody's help with getting Vicki [name] set up for plasma exchange yesterday.  She had the dialysis catheter placed.  Again, interventional radiology did a fantastic job for [place].  She did okay with the plasma exchange.  She does feel little bit "jittery" this morning.  Some of this might be from steroid use.  Her calcium is 9.7.  Her LDH is down to 525.  Her bilirubin is down to 2.5.  Her hemoglobin is 9.5.  Platelet count 28,000.  Hopefully, this is a good sign for [place].  I know that she did get platelets yesterday.  Her reticulocyte count is down to 7.6%.  I did start her on some steroids.  I will see about cutting this down to daily right now.  She is eating okay.  Her blood sugars are on the high side from the steroids.  She says her urine still has a little bit of orange color to it.  She has had no fever.  Her temperature this morning is 98.4.  On her physical exam, there is no oral thrush.  Her pulse is 88.  Her blood pressure is 114/60.  Her lungs are clear bilaterally.  Cardiac exam regular rate and rhythm.  Abdomen is soft.  Bowel sounds are present.  Skin exam shows no rashes.  Neurological exam shows no focal neurological deficits.  She will have her plasma exchange today.  I think the labs tomorrow will be incredibly critical to allow [place] to see if the plasma exchanges helping.  This will be a measure of whether or not she does have TTP.  The ADAMTS-13 as a problem is back for another couple days.  Again, I am incredibly impressed with everybody's help with Ms. Folson.  Maybe, this may be if we see continued improvement with her counts, we will be able to get her home and have treatment as an outpatient.  Lattie Haw, MD  Darlyn Chamber 17:14

## 2019-03-06 NOTE — Progress Notes (Signed)
PROGRESS NOTE  Vicki [name] HWT:[id number] DOB: 05-02-1960 DOA: 03/03/2019 PCP: Lesleigh Noe, MD  HPI/Recap of past 24 hours: HPI: Vicki [name] is a 58 y.o. female with medical history significant of  nonalcoholic cirrhosis/ fatty liver, pancytopenia, DM2, HTN, recurrent skin boils who presented to the ED due to generalized body aches, fever at home and spontaneous bruising. Has followed with Dr. Irene Limbo hematology in the past year due to worsening pancytopenia.    ED Course: Tm 101.6.  HGB 6.1, platelets <5, WBC today of 3.5, LDH 1049 and bili is 3.7, AST 125 ALT 45.  Retic count increased.  Seen by hematology Dr. 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  HD catheter placement plannerd and will start plasma exchange on 03/05/2019.  03/06/19: Seen and examined.  Jitteriness, has No Other Complaints.  Planned plasma exchange today.   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 Marin Olp, ADAMTS-13 ordered and pending. Right IJ temporary HD catheter placement on 03/05/2019 Started plasma exchange on 03/05/2019 Planned plasma exchange today. On Solu-Medrol 80 mg daily. Appreciate hematology's assistance.  Pancytopenia Transfuse to maintain hemoglobin greater than 7 Transfuse to maintain platelet count greater than 20,000. Avoid NSAIDs in the setting of low platelets All 3 lines levels are improving post transfusion.  Fever of unknown source Presented with T-max 101.6 Afebrile overnight Blood cultures negative to date, continue to follow cultures. Urine culture no growth final On cefepime and vancomycin empirically Procalcitonin less than 0.10 If continues to be afebrile in the next 24 hours will DC  IV antibiotics.  Type 2 diabetes with hyperglycemia Exacerbated by steroid use Continue sensitive insulin sliding scale A1c inaccurate  Resolved transient elevated blood pressure suspect in the setting of volume expansion from multiple blood products transfusion and steroid use Not on antihypertensive medications at home and no prior history of essential hypertension IV labetalol 5 mg every 6 hours as needed for SBP greater than 170. Continue to monitor vital signs.  Cirrhosis, non alcoholic Appears stable Avoid hepatotoxins as possible    Code Status: Full code  Family Communication: None at bedside.  Disposition Plan: Patient is currently not appropriate for discharge at this time due to ongoing treatment for presumed TTP.  We will plan to discharge once hematology signs off.  Consultants:  Hematology/oncology Dr. Marin Olp.  Interventional radiology Dr. Earleen Newport.  Procedures:  Right IJ temporary HD catheter placement by interventional radiology on 03/05/2019.  Antimicrobials:  IV vancomycin  DVT prophylaxis:  .  SCDs.   Objective: Vitals:   03/05/19 1731 03/05/19 1939 03/06/19 0441 03/06/19 1014  BP: 125/76 117/63 114/60 134/79  Pulse: 98 97 88 92  Resp:  (!) 22 18   Temp: 99.5 F (37.5 C) 98.9 F (37.2 C) 98.4 F (36.9 C) 98.5 F (36.9 C)  TempSrc: Oral Oral Oral Oral  SpO2: 98% 98% 100% 100%  Weight:   66.5 kg   Height:        Intake/Output Summary (Last 24 hours) at 03/06/2019 1057 Last data filed at 03/06/2019 0615 Gross per 24 hour  Intake 1752.36 ml  Output 1500 ml  Net 252.36 ml   Filed Weights   03/04/19 0407 03/05/19 0302 03/06/19 0441  Weight: 66.2 kg 67 kg 66.5 kg    Exam:  .  General: 58 y.o. year-old female pleasant well-developed well-nourished in no acute distress.  Alert and oriented x3.   . Cardiovascular: Regular rate and rhythm with no rubs or gallops. [name] Respiratory: Clear consultation no wheezes or rales.   . Abdomen: Soft  nontender normal bowel sounds present. . Musculoskeletal: No lower extremity edema.   Skin: Petechiae in upper and lower extremities bilaterally.   Psychiatry: Mood is appropriate for condition and setting.  Data Reviewed: CBC: Recent Labs  Lab 03/03/19 1933 03/03/19 2315 03/04/19 0427 03/05/19 0115 03/05/19 1424 03/06/19 0456  WBC 2.8* 3.5* 3.2* 2.6*  --  4.3  NEUTROABS  --  1.7 1.5* 1.2*  --  2.9  HGB 6.1* 6.0* 5.9* 8.4* 8.5* 9.5*  HCT 18.3* 18.9* 18.4* 24.9* 25.0* 27.8*  MCV 100.5* 102.7* 101.7* 97.6  --  97.9  PLT <5* <5* 42* 6*  --  28*   Basic Metabolic Panel: Recent Labs  Lab 03/04/19 0427 03/05/19 0115 03/05/19 1302 03/05/19 1424 03/06/19 0456 03/06/19 0900  NA 137 133* 136 137 135 133*  K 3.8 3.7 3.8 3.8 3.7 3.5  CL 103 105 103 101 102 101  CO2 21* 19* 22  --  23 23  GLUCOSE 141* 206* 246* 268* 276* 355*  BUN 8 11 7 8 14 11   CREATININE 0.75 0.81 0.66 0.60 0.82 0.80  CALCIUM 9.5 8.9 9.5  --  9.7 9.5   GFR: Estimated Creatinine Clearance: 67.8 mL/min (by C-G formula based on SCr of 0.8 mg/dL). Liver Function Tests: Recent Labs  Lab 03/03/19 1933 03/04/19 0427 03/04/19 1511 03/05/19 0115 03/06/19 0456  AST 125* 122*  --  108* 62*  ALT 45* 43  --  42 31  ALKPHOS 55 56  --  56 55  BILITOT 3.7* 3.4* 2.6* 3.6* 2.5*  PROT 8.0 7.7  --  7.7 6.9  ALBUMIN 3.6 3.5  --  3.3* 3.5   Recent Labs  Lab 03/03/19 1933  LIPASE 38   No results for input(s): AMMONIA in the last 168 hours. Coagulation Profile: Recent Labs  Lab 03/03/19 2300  INR 1.3*   Cardiac Enzymes: No results for input(s): CKTOTAL, CKMB, CKMBINDEX, TROPONINI in the last 168 hours. BNP (last 3 results) No results for input(s): PROBNP in the last 8760 hours. HbA1C: Recent Labs    03/05/19 1804  HGBA1C 4.3*   CBG: Recent Labs  Lab 03/05/19 1741 03/05/19 2228 03/06/19 0613  GLUCAP 282* 264* 244*   Lipid Profile: Recent Labs    03/03/19 2136  TRIG 109   Thyroid Function  Tests: No results for input(s): TSH, T4TOTAL, FREET4, T3FREE, THYROIDAB in the last 72 hours. Anemia Panel: Recent Labs    03/03/19 2136 03/05/19 0115 03/06/19 0456  FERRITIN 1,134*  --   --   RETICCTPCT  --  7.5* 7.6*   Urine analysis:    Component Value Date/Time   COLORURINE YELLOW 03/04/2019 0024   APPEARANCEUR CLEAR 03/04/2019 0024   LABSPEC 1.005 03/04/2019 0024   PHURINE 7.0 03/04/2019 0024   GLUCOSEU NEGATIVE 03/04/2019 0024   HGBUR MODERATE (A) 03/04/2019 0024   BILIRUBINUR NEGATIVE 03/04/2019 0024   KETONESUR NEGATIVE 03/04/2019 0024   PROTEINUR NEGATIVE 03/04/2019 0024   NITRITE NEGATIVE 03/04/2019 0024   LEUKOCYTESUR NEGATIVE 03/04/2019 0024   Sepsis Labs: @LABRCNTIP (procalcitonin:4,lacticidven:4)  ) Recent Results (from the past 240 hour(s))  Blood Culture (routine x 2)     Status: None (Preliminary result)   Collection Time: 03/03/19 10:00 PM   Specimen:  BLOOD  Result Value Ref Range Status   Specimen Description BLOOD RIGHT ANTECUBITAL  Final   Special Requests   Final    BOTTLES DRAWN AEROBIC AND ANAEROBIC Blood Culture adequate volume   Culture   Final    NO GROWTH 2 DAYS Performed at [facility], [address], [place], [place] [address]    Report Status PENDING  Incomplete  Blood Culture (routine x 2)     Status: None (Preliminary result)   Collection Time: 03/03/19 10:38 PM   Specimen: BLOOD  Result Value Ref Range Status   Specimen Description BLOOD LEFT ANTECUBITAL  Final   Special Requests   Final    BOTTLES DRAWN AEROBIC AND ANAEROBIC Blood Culture results may not be optimal due to an excessive volume of blood received in culture bottles   Culture   Final    NO GROWTH 2 DAYS Performed at [facility], [place] [address], [place], [place] [address]    Report Status PENDING  Incomplete  Respiratory Panel by RT PCR (Flu A&B, Covid) - Nasopharyngeal Swab     Status: None   Collection Time: 03/03/19 11:19 PM   Specimen:  Nasopharyngeal Swab  Result Value Ref Range Status   SARS Coronavirus 2 by RT PCR NEGATIVE NEGATIVE Final    Comment: (NOTE) SARS-CoV-2 target nucleic acids are NOT DETECTED. The SARS-CoV-2 RNA is generally detectable in upper respiratoy specimens during the acute phase of infection. The lowest concentration of SARS-CoV-2 viral copies this assay can detect is 131 copies/mL. A negative result does not preclude SARS-Cov-2 infection and should not be used as the sole basis for treatment or other patient management decisions. A negative result may occur with  improper specimen collection/handling, submission of specimen other than nasopharyngeal swab, presence of viral mutation(s) within the areas targeted by this assay, and inadequate number of viral copies (<131 copies/mL). A negative result must be combined with clinical observations, patient history, and epidemiological information. The expected result is Negative. Fact Sheet for Patients:  PinkCheek.be Fact Sheet for Healthcare Providers:  GravelBags.it This test is not yet ap proved or cleared by the [place] FDA and  has been authorized for detection and/or diagnosis of SARS-CoV-2 by FDA under an Emergency Use Authorization (EUA). This EUA will remain  in effect (meaning this test can be used) for the duration of the COVID-19 declaration under Section 564(b)(1) of the Act, 21 U.S.C. section 360bbb-3(b)(1), unless the authorization is terminated or revoked sooner.    Influenza A by PCR NEGATIVE NEGATIVE Final   Influenza B by PCR NEGATIVE NEGATIVE Final    Comment: (NOTE) The Xpert Xpress SARS-CoV-2/FLU/RSV assay is intended as an aid in  the diagnosis of influenza from Nasopharyngeal swab specimens and  should not be used as a sole basis for treatment. Nasal washings and  aspirates are unacceptable for Xpert Xpress SARS-CoV-2/FLU/RSV  testing. Fact Sheet for  Patients: PinkCheek.be Fact Sheet for Healthcare Providers: GravelBags.it This test is not yet approved or cleared by the [place] FDA and  has been authorized for detection and/or diagnosis of SARS-CoV-2 by  FDA under an Emergency Use Authorization (EUA). This EUA will remain  in effect (meaning this test can be used) for the duration of the  Covid-19 declaration under Section 564(b)(1) of the Act, 21  U.S.C. section 360bbb-3(b)(1), unless the authorization is  terminated or revoked. Performed at [facility], [place] [address] [place], [place] [address]   Urine culture  Status: None   Collection Time: 03/04/19 12:24 AM   Specimen: Urine, Random  Result Value Ref Range Status   Specimen Description URINE, RANDOM  Final   Special Requests NONE  Final   Culture   Final    NO GROWTH Performed at [facility], [address], [place], [place] [address]    Report Status 03/04/2019 FINAL  Final  MRSA PCR Screening     Status: None   Collection Time: 03/04/19 12:18 PM   Specimen: Nasal Mucosa; Nasopharyngeal  Result Value Ref Range Status   MRSA by PCR NEGATIVE NEGATIVE Final    Comment:        The GeneXpert MRSA Assay (FDA approved for NASAL specimens only), is one component of a comprehensive MRSA colonization surveillance program. It is not intended to diagnose MRSA infection nor to guide or monitor treatment for MRSA infections. Performed at [facility], [place] [address], [place], [place] [address]       Studies: No results found.  Scheduled Meds: . sodium chloride   Intravenous Once  . calcium carbonate      . calcium carbonate  2 tablet Oral Q3H  . Chlorhexidine Gluconate Cloth  6 each Topical Daily  . insulin aspart  0-5 Units Subcutaneous QHS  . insulin aspart  0-9 Units Subcutaneous TID WC  . [START ON 03/07/2019] methylPREDNISolone (SOLU-MEDROL) injection  80 mg Intravenous Q24H   . sodium chloride flush  3 mL Intravenous Once    Continuous Infusions: . sodium chloride 250 mL (03/05/19 0058)  . anticoagulant sodium citrate    . calcium gluconate IVPB    . citrate dextrose    . citrate dextrose    . citrate dextrose    . vancomycin 1,250 mg (03/05/19 1249)     LOS: 2 days     Kayleen Memos, MD [facility] Pager [phone]  If 7PM-7AM, please contact night-coverage [email or web address] Password [email or web address] 03/06/2019, 10:57 AM

## 2019-03-06 NOTE — Progress Notes (Signed)
Inpatient Diabetes Program Recommendations  AACE/ADA: New Consensus Statement on Inpatient Glycemic Control (2015)  Target Ranges:  Prepandial:   less than 140 mg/dL      Peak postprandial:   less than 180 mg/dL (1-2 hours)      Critically ill patients:  140 - 180 mg/dL   Lab Results  Component Value Date   GLUCAP 244 (H) 03/06/2019   HGBA1C 4.3 (L) 03/05/2019    Review of Glycemic Control Results for Vicki [name], Vicki "DENISE" (MRN [id number]) as of 03/06/2019 13:19  Ref. Range 03/05/2019 17:41 03/05/2019 22:28 03/06/2019 06:13  Glucose-Capillary Latest Ref Range: 70 - 99 mg/dL 282 (H) 264 (H) 244 (H)   Diabetes history: Type 2 DM Outpatient Diabetes medications: none Current orders for Inpatient glycemic control: Novolog 0-9 units TID, Novolog 0-5 units QHS Solumedrol 80 mg Q24H  Inpatient Diabetes Program Recommendations:    In the setting of steroids, consider adding Levemir 10 units QD and increase correction to Novolog 0-15 units TID.   Thanks, Bronson Curb, MSN, RNC-OB Diabetes Coordinator [phone] (8a-5p)

## 2019-03-06 NOTE — Progress Notes (Addendum)
Report given to Ronny Bacon in HD.Left unit for HD at 1032.   Report received from Ronny Bacon in HD. Returned to unit at 1326, A&Ox4, HD cath site clean, dry intact with no drainage  Paged Hall at 1610. Pt has mild 3/10 pain at HD cath site. Add PRN Tylenol? Also, temp of 99.7 at 1345.   Paged Hall at [employer]. Pt. requesting social work consult to discuss plans for leave of absence from work/income challenges.

## 2019-03-07 LAB — THERAPEUTIC PLASMA EXCHANGE (BLOOD BANK)
Plasma Exchange: 2522
Plasma volume needed: 2522
Unit division: 0
Unit division: 0
Unit division: 0
Unit division: 0
Unit division: 0
Unit division: 0
Unit division: 0
Unit division: 0

## 2019-03-07 LAB — GLUCOSE, CAPILLARY
Glucose-Capillary: 133 mg/dL — ABNORMAL HIGH (ref 70–99)
Glucose-Capillary: 342 mg/dL — ABNORMAL HIGH (ref 70–99)
Glucose-Capillary: 259 mg/dL — ABNORMAL HIGH (ref 70–99)
Glucose-Capillary: 205 mg/dL — ABNORMAL HIGH (ref 70–99)

## 2019-03-07 LAB — POCT I-STAT, CHEM 8
BUN: 17 mg/dL (ref 6–20)
Calcium, Ion: 1.24 mmol/L (ref 1.15–1.40)
Chloride: 98 mmol/L (ref 98–111)
Creatinine, Ser: 0.7 mg/dL (ref 0.44–1.00)
Glucose, Bld: 355 mg/dL — ABNORMAL HIGH (ref 70–99)
HCT: 26 % — ABNORMAL LOW (ref 36.0–46.0)
Hemoglobin: 8.8 g/dL — ABNORMAL LOW (ref 12.0–15.0)
Potassium: 3.7 mmol/L (ref 3.5–5.1)
Sodium: 140 mmol/L (ref 135–145)
TCO2: 25 mmol/L (ref 22–32)

## 2019-03-07 LAB — COMPREHENSIVE METABOLIC PANEL
ALT: 27 U/L (ref 0–44)
AST: 39 U/L (ref 15–41)
Albumin: 3.3 g/dL — ABNORMAL LOW (ref 3.5–5.0)
Alkaline Phosphatase: 53 U/L (ref 38–126)
Anion gap: 9 (ref 5–15)
BUN: 15 mg/dL (ref 6–20)
CO2: 26 mmol/L (ref 22–32)
Calcium: 9.7 mg/dL (ref 8.9–10.3)
Chloride: 104 mmol/L (ref 98–111)
Creatinine, Ser: 0.61 mg/dL (ref 0.44–1.00)
GFR calc Af Amer: >60 mL/min (ref >60)
GFR calc non Af Amer: >60 mL/min (ref >60)
Glucose, Bld: 151 mg/dL — ABNORMAL HIGH (ref 70–99)
Potassium: 3.6 mmol/L (ref 3.5–5.1)
Sodium: 139 mmol/L (ref 135–145)
Total Bilirubin: 1.8 mg/dL — ABNORMAL HIGH (ref 0.3–1.2)
Total Protein: 6 g/dL — ABNORMAL LOW (ref 6.5–8.1)

## 2019-03-07 LAB — BASIC METABOLIC PANEL
Anion gap: 10 (ref 5–15)
BUN: 14 mg/dL (ref 6–20)
CO2: 25 mmol/L (ref 22–32)
Calcium: 9.5 mg/dL (ref 8.9–10.3)
Chloride: 101 mmol/L (ref 98–111)
Creatinine, Ser: 0.82 mg/dL (ref 0.44–1.00)
GFR calc Af Amer: >60 mL/min (ref >60)
GFR calc non Af Amer: >60 mL/min (ref >60)
Glucose, Bld: 360 mg/dL — ABNORMAL HIGH (ref 70–99)
Potassium: 3.8 mmol/L (ref 3.5–5.1)
Sodium: 136 mmol/L (ref 135–145)

## 2019-03-07 LAB — CBC WITH DIFFERENTIAL/PLATELET
Abs Immature Granulocytes: 0.05 10*3/uL (ref 0.00–0.07)
Basophils Absolute: 0 10*3/uL (ref 0.0–0.1)
Basophils Relative: 0 %
Eosinophils Absolute: 0 10*3/uL (ref 0.0–0.5)
Eosinophils Relative: 0 %
HCT: 25 % — ABNORMAL LOW (ref 36.0–46.0)
Hemoglobin: 8.3 g/dL — ABNORMAL LOW (ref 12.0–15.0)
Immature Granulocytes: 1 %
Lymphocytes Relative: 24 %
Lymphs Abs: 1.4 10*3/uL (ref 0.7–4.0)
MCH: 33.5 pg (ref 26.0–34.0)
MCHC: 33.2 g/dL (ref 30.0–36.0)
MCV: 100.8 fL — ABNORMAL HIGH (ref 80.0–100.0)
Monocytes Absolute: 0.4 10*3/uL (ref 0.1–1.0)
Monocytes Relative: 6 %
Neutro Abs: 4.2 10*3/uL (ref 1.7–7.7)
Neutrophils Relative %: 69 %
Platelets: 30 10*3/uL — ABNORMAL LOW (ref 150–400)
RBC: 2.48 MIL/uL — ABNORMAL LOW (ref 3.87–5.11)
RDW: 20.6 % — ABNORMAL HIGH (ref 11.5–15.5)
WBC: 6.1 10*3/uL (ref 4.0–10.5)
nRBC: 1.2 % — ABNORMAL HIGH (ref 0.0–0.2)

## 2019-03-07 LAB — RETICULOCYTES
Immature Retic Fract: 30 % — ABNORMAL HIGH (ref 2.3–15.9)
RBC.: 2.48 MIL/uL — ABNORMAL LOW (ref 3.87–5.11)
Retic Count, Absolute: 189.7 10*3/uL — ABNORMAL HIGH (ref 19.0–186.0)
Retic Ct Pct: 7.7 % — ABNORMAL HIGH (ref 0.4–3.1)

## 2019-03-07 LAB — LACTATE DEHYDROGENASE: LDH: 311 U/L — ABNORMAL HIGH (ref 98–192)

## 2019-03-07 MED ORDER — SODIUM CHLORIDE 0.9 % IV SOLN
2.0000 g | Freq: Once | INTRAVENOUS | Status: AC
  Administered 2019-03-07: 2 g via INTRAVENOUS
  Filled 2019-03-07 (×2): qty 20

## 2019-03-07 MED ORDER — ACD FORMULA A 0.73-2.45-2.2 GM/100ML VI SOLN
Status: AC
  Filled 2019-03-07: qty 500

## 2019-03-07 MED ORDER — ANTICOAGULANT SODIUM CITRATE 4% (200MG/5ML) IV SOLN
5.0000 mL | Freq: Once | Status: AC
  Administered 2019-03-07: 5 mL
  Filled 2019-03-07: qty 5

## 2019-03-07 MED ORDER — DIPHENHYDRAMINE HCL 25 MG PO CAPS: 25.0000 mg | ORAL_CAPSULE | Freq: Four times a day (QID) | ORAL | Status: DC | PRN

## 2019-03-07 MED ORDER — ACD FORMULA A 0.73-2.45-2.2 GM/100ML VI SOLN
500.0000 mL | Status: DC
  Administered 2019-03-07: 500 mL via INTRAVENOUS
  Filled 2019-03-07: qty 500

## 2019-03-07 MED ORDER — DOCUSATE SODIUM 100 MG PO CAPS: 100.0000 mg | ORAL_CAPSULE | Freq: Every day | ORAL | Status: DC | PRN

## 2019-03-07 MED ORDER — ENSURE ENLIVE PO LIQD: 237.0000 mL | Freq: Two times a day (BID) | ORAL | Status: DC

## 2019-03-07 MED ORDER — GLUCERNA SHAKE PO LIQD
237.0000 mL | Freq: Two times a day (BID) | ORAL | Status: DC
  Administered 2019-03-07 – 2019-03-14 (×13): 237 mL via ORAL

## 2019-03-07 MED ORDER — CALCIUM CARBONATE ANTACID 500 MG PO CHEW
2.0000 | CHEWABLE_TABLET | ORAL | Status: AC
  Administered 2019-03-07: 400 mg via ORAL
  Filled 2019-03-07: qty 2

## 2019-03-07 MED ORDER — SENNOSIDES-DOCUSATE SODIUM 8.6-50 MG PO TABS
2.0000 | ORAL_TABLET | Freq: Two times a day (BID) | ORAL | Status: DC
  Administered 2019-03-07 – 2019-03-15 (×13): 2 via ORAL
  Filled 2019-03-07 (×15): qty 2

## 2019-03-07 MED ORDER — CALCIUM CARBONATE ANTACID 500 MG PO CHEW
CHEWABLE_TABLET | ORAL | Status: AC
  Filled 2019-03-07: qty 2

## 2019-03-07 MED ORDER — ACETAMINOPHEN 325 MG PO TABS
650.0000 mg | ORAL_TABLET | ORAL | Status: DC | PRN
  Filled 2019-03-07: qty 2

## 2019-03-07 MED ORDER — FUROSEMIDE 10 MG/ML IJ SOLN
20.0000 mg | Freq: Once | INTRAMUSCULAR | Status: AC
  Administered 2019-03-07: 20 mg via INTRAVENOUS
  Filled 2019-03-07: qty 2

## 2019-03-07 NOTE — Progress Notes (Signed)
PROGRESS NOTE  Vicki [name] PYP:[id number] DOB: 06-Nov-1960 DOA: 03/03/2019 PCP: Lesleigh Noe, MD  HPI/Recap of past 24 hours: HPI: Vicki [name] is a 58 y.o. female with medical history significant of  nonalcoholic cirrhosis/ fatty liver, pancytopenia, DM2, HTN, recurrent skin boils who presented to the ED due to generalized body aches, fever at home and spontaneous bruising. Has followed with Dr. Irene Limbo hematology in the past year due to worsening pancytopenia.    ED Course: Tm 101.6.  HGB 6.1, platelets <5, WBC today of 3.5, LDH 1049 and bili is 3.7, AST 125 ALT 45.  Retic count increased.  Seen by hematology Dr. 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  HD catheter placement plannerd and will start plasma exchange on 03/05/2019.  03/07/19: Seen and examined.  Reports bilateral lower extremity edema.  Mild on exam.  Ordered 1 dose of IV Lasix 20 mg once.  No other complaints.  Labs improved.   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 Marin Olp, ADAMTS-13 ordered and pending. Right IJ temporary HD catheter placement on 03/05/2019 Started plasma exchange on 03/05/2019 Planned plasma exchange 12/30, 12/31. Received Solu-Medrol 80 mg daily this morning.  Appreciate hematology's assistance.  Pancytopenia, improving with transfusions and plasma exchange. Transfuse to maintain hemoglobin greater than 7 Transfuse to maintain platelet count greater than 20,000. Avoid NSAIDs in the setting of low platelets All 3 lines levels are improving post transfusion.  Fever of unknown source Presented with T-max 101.6 Afebrile overnight Blood cultures negative to date, continue to follow cultures. Urine culture no  growth final On cefepime and vancomycin empirically Procalcitonin less than 0.10 If continues to be afebrile in the next 24 hours will DC IV antibiotics.  Type 2 diabetes with hyperglycemia Exacerbated by steroid use Continue sensitive insulin sliding scale A1c inaccurate  Resolved transient elevated blood pressure suspect in the setting of volume expansion from multiple blood products transfusion and steroid use Not on antihypertensive medications at home and no prior history of essential hypertension IV labetalol 5 mg every 6 hours as needed for SBP greater than 170. Continue to monitor vital signs.  Cirrhosis, non alcoholic Appears stable Avoid hepatotoxins as possible    Code Status: Full code  Family Communication: None at bedside.  Disposition Plan: Patient is currently not appropriate for discharge at this time due to ongoing treatment for presumed TTP.  We will plan to discharge once hematology signs off.  Consultants:  Hematology/oncology Dr. Marin Olp.  Interventional radiology Dr. Earleen Newport.  Procedures:  Right IJ temporary HD catheter placement by interventional radiology on 03/05/2019.  Antimicrobials:  IV vancomycin, DC 03/07/19  DVT prophylaxis:  .  SCDs.   Objective: Vitals:   03/07/19 1348 03/07/19 1405 03/07/19 1420 03/07/19 1440  BP: 135/78 127/69 131/70 130/64  Pulse: 93 89 90 92  Resp: (!) 24 20 (!) 22 (!) 26  Temp: 98.5 F (36.9 C) 98.3 F (36.8 C) 98.2 F (36.8 C) 98.2 F (36.8 C)  TempSrc: Oral Oral Oral Oral  SpO2:      Weight:      Height:        Intake/Output Summary (Last 24 hours) at 03/07/2019 1455 Last data filed at 03/07/2019 1300 Gross per 24 hour  Intake 860 ml  Output 1100 ml  Net -240 ml   Filed Weights   03/05/19 0302 03/06/19 0441 03/07/19 0455  Weight: 67 kg 66.5 kg 67.6 kg    Exam:  . General: 58 y.o. year-old female pleasant well-developed well-nourished no acute distress.  Cardiovascular: Regular rate and  rhythm no rubs or gallops.   [name] Respiratory: Clear to auscultation no wheezes or rales. . Abdomen: Soft Nontender Normal Bowel Sounds Present. Musculoskeletal: No lower extremity edema.   Skin: Trace edema lower extremities bilaterally. Psychiatry: Mood is appropriate for condition and setting.  Data Reviewed: CBC: Recent Labs  Lab 03/03/19 2315 03/04/19 0427 03/05/19 0115 03/05/19 1424 03/06/19 0456 03/06/19 1050 03/07/19 0421 03/07/19 1345  WBC 3.5* 3.2* 2.6*  --  4.3  --  6.1  --   NEUTROABS 1.7 1.5* 1.2*  --  2.9  --  4.2  --   HGB 6.0* 5.9* 8.4* 8.5* 9.5* 8.5* 8.3* 8.8*  HCT 18.9* 18.4* 24.9* 25.0* 27.8* 25.0* 25.0* 26.0*  MCV 102.7* 101.7* 97.6  --  97.9  --  100.8*  --   PLT <5* 42* 6*  --  28*  --  30*  --    Basic Metabolic Panel: Recent Labs  Lab 03/05/19 1302 03/06/19 0456 03/06/19 0900 03/06/19 1050 03/07/19 0421 03/07/19 1230 03/07/19 1345  NA 136 135 133* 136 139 136 140  K 3.8 3.7 3.5 3.4* 3.6 3.8 3.7  CL 103 102 101 99 104 101 98  CO2 22 23 23   --  26 25  --   GLUCOSE 246* 276* 355* 359* 151* 360* 355*  BUN 7 14 11 13 15 14 17   CREATININE 0.66 0.82 0.80 0.70 0.61 0.82 0.70  CALCIUM 9.5 9.7 9.5  --  9.7 9.5  --    GFR: Estimated Creatinine Clearance: 68.3 mL/min (by C-G formula based on SCr of 0.7 mg/dL). Liver Function Tests: Recent Labs  Lab 03/03/19 1933 03/04/19 0427 03/04/19 1511 03/05/19 0115 03/06/19 0456 03/07/19 0421  AST 125* 122*  --  108* 62* 39  ALT 45* 43  --  42 31 27  ALKPHOS 55 56  --  56 55 53  BILITOT 3.7* 3.4* 2.6* 3.6* 2.5* 1.8*  PROT 8.0 7.7  --  7.7 6.9 6.0*  ALBUMIN 3.6 3.5  --  3.3* 3.5 3.3*   Recent Labs  Lab 03/03/19 1933  LIPASE 38   No results for input(s): AMMONIA in the last 168 hours. Coagulation Profile: Recent Labs  Lab 03/03/19 2300  INR 1.3*   Cardiac Enzymes: No results for input(s): CKTOTAL, CKMB, CKMBINDEX, TROPONINI in the last 168 hours. BNP (last 3 results) No results for input(s):  PROBNP in the last 8760 hours. HbA1C: Recent Labs    03/05/19 1804  HGBA1C 4.3*   CBG: Recent Labs  Lab 03/06/19 0613 03/06/19 1650 03/06/19 2100 03/07/19 0602 03/07/19 1144  GLUCAP 244* 304* 251* 133* 342*   Lipid Profile: No results for input(s): CHOL, HDL, LDLCALC, TRIG, CHOLHDL, LDLDIRECT in the last 72 hours. Thyroid Function Tests: No results for input(s): TSH, T4TOTAL, FREET4, T3FREE, THYROIDAB in the last 72 hours. Anemia Panel: Recent Labs    03/06/19 0456 03/07/19 0421  RETICCTPCT 7.6* 7.7*   Urine analysis:    Component Value Date/Time   COLORURINE YELLOW 03/04/2019 0024   APPEARANCEUR CLEAR 03/04/2019 0024   LABSPEC 1.005 03/04/2019 0024   PHURINE 7.0 03/04/2019 0024   GLUCOSEU NEGATIVE 03/04/2019 0024   HGBUR MODERATE (A) 03/04/2019 0024   BILIRUBINUR NEGATIVE 03/04/2019  0024   KETONESUR NEGATIVE 03/04/2019 0024   PROTEINUR NEGATIVE 03/04/2019 0024   NITRITE NEGATIVE 03/04/2019 0024   LEUKOCYTESUR NEGATIVE 03/04/2019 0024   Sepsis Labs: @LABRCNTIP (procalcitonin:4,lacticidven:4)  ) Recent Results (from the past 240 hour(s))  Blood Culture (routine x 2)     Status: None (Preliminary result)   Collection Time: 03/03/19 10:00 PM   Specimen: BLOOD  Result Value Ref Range Status   Specimen Description BLOOD RIGHT ANTECUBITAL  Final   Special Requests   Final    BOTTLES DRAWN AEROBIC AND ANAEROBIC Blood Culture adequate volume   Culture   Final    NO GROWTH 4 DAYS Performed at [facility], [place] [address], [place], [place] [address]    Report Status PENDING  Incomplete  Blood Culture (routine x 2)     Status: None (Preliminary result)   Collection Time: 03/03/19 10:38 PM   Specimen: BLOOD  Result Value Ref Range Status   Specimen Description BLOOD LEFT ANTECUBITAL  Final   Special Requests   Final    BOTTLES DRAWN AEROBIC AND ANAEROBIC Blood Culture results may not be optimal due to an excessive volume of blood received in culture bottles    Culture   Final    NO GROWTH 4 DAYS Performed at [facility], [place] [address] [place], [place] [address]    Report Status PENDING  Incomplete  Respiratory Panel by RT PCR (Flu A&B, Covid) - Nasopharyngeal Swab     Status: None   Collection Time: 03/03/19 11:19 PM   Specimen: Nasopharyngeal Swab  Result Value Ref Range Status   SARS Coronavirus 2 by RT PCR NEGATIVE NEGATIVE Final    Comment: (NOTE) SARS-CoV-2 target nucleic acids are NOT DETECTED. The SARS-CoV-2 RNA is generally detectable in upper respiratoy specimens during the acute phase of infection. The lowest concentration of SARS-CoV-2 viral copies this assay can detect is 131 copies/mL. A negative result does not preclude SARS-Cov-2 infection and should not be used as the sole basis for treatment or other patient management decisions. A negative result may occur with  improper specimen collection/handling, submission of specimen other than nasopharyngeal swab, presence of viral mutation(s) within the areas targeted by this assay, and inadequate number of viral copies (<131 copies/mL). A negative result must be combined with clinical observations, patient history, and epidemiological information. The expected result is Negative. Fact Sheet for Patients:  PinkCheek.be Fact Sheet for Healthcare Providers:  GravelBags.it This test is not yet ap proved or cleared by the [place] FDA and  has been authorized for detection and/or diagnosis of SARS-CoV-2 by FDA under an Emergency Use Authorization (EUA). This EUA will remain  in effect (meaning this test can be used) for the duration of the COVID-19 declaration under Section 564(b)(1) of the Act, 21 U.S.C. section 360bbb-3(b)(1), unless the authorization is terminated or revoked sooner.    Influenza A by PCR NEGATIVE NEGATIVE Final   Influenza B by PCR NEGATIVE NEGATIVE Final    Comment: (NOTE) The Xpert  Xpress SARS-CoV-2/FLU/RSV assay is intended as an aid in  the diagnosis of influenza from Nasopharyngeal swab specimens and  should not be used as a sole basis for treatment. Nasal washings and  aspirates are unacceptable for Xpert Xpress SARS-CoV-2/FLU/RSV  testing. Fact Sheet for Patients: PinkCheek.be Fact Sheet for Healthcare Providers: GravelBags.it This test is not yet approved or cleared by the [place] FDA and  has been authorized for detection and/or diagnosis of SARS-CoV-2 by  FDA  under an Emergency Use Authorization (EUA). This EUA will remain  in effect (meaning this test can be used) for the duration of the  Covid-19 declaration under Section 564(b)(1) of the Act, 21  U.S.C. section 360bbb-3(b)(1), unless the authorization is  terminated or revoked. Performed at [facility], [place] [address], [place], [place] [address]   Urine culture     Status: None   Collection Time: 03/04/19 12:24 AM   Specimen: Urine, Random  Result Value Ref Range Status   Specimen Description URINE, RANDOM  Final   Special Requests NONE  Final   Culture   Final    NO GROWTH Performed at [facility], [place] [address] [place], [place] [address]    Report Status 03/04/2019 FINAL  Final  MRSA PCR Screening     Status: None   Collection Time: 03/04/19 12:18 PM   Specimen: Nasal Mucosa; Nasopharyngeal  Result Value Ref Range Status   MRSA by PCR NEGATIVE NEGATIVE Final    Comment:        The GeneXpert MRSA Assay (FDA approved for NASAL specimens only), is one component of a comprehensive MRSA colonization surveillance program. It is not intended to diagnose MRSA infection nor to guide or monitor treatment for MRSA infections. Performed at [facility], [place] [address] [place], [place] [address]       Studies: No results found.  Scheduled Meds: . sodium chloride   Intravenous Once  . calcium carbonate       . calcium carbonate  2 tablet Oral Q3H  . Chlorhexidine Gluconate Cloth  6 each Topical Daily  . feeding supplement (ENSURE ENLIVE)  237 mL Oral BID BM  . feeding supplement (GLUCERNA SHAKE)  237 mL Oral BID BM  . insulin aspart  0-15 Units Subcutaneous TID WC  . insulin aspart  0-5 Units Subcutaneous QHS  . methylPREDNISolone (SOLU-MEDROL) injection  80 mg Intravenous Q24H  . senna-docusate  2 tablet Oral BID  . sodium chloride flush  3 mL Intravenous Once    Continuous Infusions: . sodium chloride 250 mL (03/05/19 0058)  . anticoagulant sodium citrate    . calcium gluconate IVPB 2 g (03/07/19 1344)  . citrate dextrose       LOS: 3 days     Kayleen Memos, MD [facility] Pager [phone]  If 7PM-7AM, please contact night-coverage [email or web address] Password [email or web address] 03/07/2019, 2:55 PM

## 2019-03-07 NOTE — Care Management (Addendum)
Went to room to speak with patient about assistance applying for FMLA. She was in HD.  FMLA papers will need to be obtained by patient from employer. It will be the patient's responsibility to have these forms completed by her PCP.   The patient will need to apply for disability through the Social Security office. This information has been added to the AVS.  PCP Einar Pheasant Jobe Marker, MD

## 2019-03-07 NOTE — Progress Notes (Signed)
Inpatient Diabetes Program Recommendations  AACE/ADA: New Consensus Statement on Inpatient Glycemic Control (2015)  Target Ranges:  Prepandial:   less than 140 mg/dL      Peak postprandial:   less than 180 mg/dL (1-2 hours)      Critically ill patients:  140 - 180 mg/dL   Lab Results  Component Value Date   GLUCAP 133 (H) 03/07/2019   HGBA1C 4.3 (L) 03/05/2019    Review of Glycemic Control Results for SHIREEN, Vicki [name] "Vicki [name]" (MRN [id number]) as of 03/07/2019 08:47  Ref. Range 03/06/2019 06:13 03/06/2019 16:50 03/06/2019 21:00 03/07/2019 06:02  Glucose-Capillary Latest Ref Range: 70 - 99 mg/dL 244 (H) 304 (H) 251 (H) 133 (H)   Diabetes history: Type 2 DM Outpatient Diabetes medications: none Current orders for Inpatient glycemic control: Novolog 0-9 units TID, Novolog 0-5 units QHS Solumedrol 80 mg Q24H  Inpatient Diabetes Program Recommendations:    In the setting of steroids, if post prandials continue to exceed 180 mg/dL, consider adding Novolog 3 units TID (assuming patient is consuming >50% of meals).   Thanks, Bronson Curb, MSN, RNC-OB Diabetes Coordinator [phone] (8a-5p)

## 2019-03-07 NOTE — Progress Notes (Signed)
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  Her ADAMSTS-13 is less than 2%.  This result, I believe, is diagnostic for TTP.  She is doing okay with the plasma exchange.  She still gets a little jittery.  Her blood sugars are still little on the higher side.  I did cut back on the steroid.  We can possibly even discontinue it today.  Her labs continue to improve.  Her LDH is now down to 311.  Her platelet count is 30,000.  Her bilirubin is 1.8.  Her reticulocyte count is 7.7%.  Her hemoglobin is 8.3.  She has had no fever.  All of her cultures have been negative.  I would consider stopping the antibiotics on her.  She is eating okay.  She has had no bowel movement x2 days.  She has had no problems with headache.  There is no cough.  She is out of bed.  Vital signs show temperature 97.9.  Pulse 80.  Blood pressure 134/69.  Her lungs are clear bilaterally.  Cardiac exam regular rate and rhythm with no murmurs, rubs or bruits.  Abdomen is soft.  She has good bowel sounds.  There is no guarding or rebound tenderness.  Extremities shows some slight edema in her lower legs.  She has good range of motion of her joints.  She has good strength.  There is no probable venous cord.  Neurological exam is nonfocal.  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  I am still not sure what the etiology of this is.  She will continue plasma exchange for right now.  I would continue her on daily plasma exchange until the platelet count gets above 100,000.  I know that there might be an underlying factor with respect to her platelet count getting above 100,000 and that she does have an element of cirrhosis.  I think the LDH coming down so nicely is clearly a good factor.  Her bilirubin is normalizing.  Again, we will continue daily plasma exchange for right now.  I appreciate all the great help that she is getting from everybody up on 3 E.  Lattie Haw, MD  Romans 5:3-5

## 2019-03-07 NOTE — Progress Notes (Signed)
Patient tempt. 100.3 oral after HD. MD notified, 2 tylenol given per order will continue to monitor.

## 2019-03-07 NOTE — Progress Notes (Signed)
Updated the patient's daughter Artemio Aly via phone.  All questions answered to her satisfaction.

## 2019-03-07 NOTE — Progress Notes (Signed)
Initial Nutrition Assessment  DOCUMENTATION CODES:   Not applicable  INTERVENTION:   -Glucerna Shake po BID, each supplement provides 220 kcal and 10 grams of protein -MVI with minerals daily  NUTRITION DIAGNOSIS:   Increased nutrient needs related to acute illness as evidenced by estimated needs.  GOAL:   Patient will meet greater than or equal to 90% of their needs  MONITOR:   PO intake, Supplement acceptance, Labs, Weight trends, Skin, I & O's  REASON FOR ASSESSMENT:   Malnutrition Screening Tool    ASSESSMENT:   Vicki [name] is a 58 y.o. female with medical history significant of cirrhosis / fatty liver, DM2, HTN.  Pt admitted with acquired pancytopenia.   12/29- s/p temporary HD cath placement  Reviewed I/O's: -500 ml x 24 hours and +726 ml since admission  UOP: 1.1 L x 24 hours  Per hematology notes, pt with new diagnosis of TTP (ADAMSTS-13 less than 2%). Plan for daily plasma exchange.   Pt washing up at time of attempted visit. RD also attempted to call pt to obtain further history, however, no answer.   Per chart review, pt endorses fair appetite. Noted meal completion 70-100% of meals.   Reviewed wt hx; noted pt has experienced a 6.5% wt los over the past 11 months, which is not significant for time frame.   Medications reviewed and include solu-medrol. Pt with elevated CBGS due to steroids; per MD, plan to taper down and d/c soon.   Lab Results  Component Value Date   HGBA1C 4.3 (L) 03/05/2019   PTA DM medications are none.   Labs reviewed: CBGS: 133-304 (inpatient orders for glycemic control are 0-15 units insulin aspart TID with meals and 0-5 units insulin aspart q HS).   Diet Order:   Diet Order            Diet Carb Modified Fluid consistency: Thin; Room service appropriate? Yes  Diet effective now              EDUCATION NEEDS:   No education needs have been identified at this time  Skin:  Skin Assessment: Reviewed RN  Assessment  Last BM:  03/07/19  Height:   Ht Readings from Last 1 Encounters:  03/04/19 5' 2"  (1.575 m)    Weight:   Wt Readings from Last 1 Encounters:  03/07/19 67.6 kg    Ideal Body Weight:  50 kg  BMI:  Body mass index is 27.27 kg/m.  Estimated Nutritional Needs:   Kcal:  1800-2000  Protein:  90-105 grams  Fluid:  1.8- 2 L    Jalan Bodi A. Jimmye Norman, RD, LDN, [place] Registered Dietitian II Certified Diabetes Care and Education Specialist Pager: [phone] After hours Pager: [phone]

## 2019-03-08 LAB — PREPARE RBC (CROSSMATCH)

## 2019-03-08 LAB — THERAPEUTIC PLASMA EXCHANGE (BLOOD BANK)
Plasma Exchange: 2524
Plasma volume needed: 2522
Unit division: 0
Unit division: 0
Unit division: 0
Unit division: 0
Unit division: 0
Unit division: 0
Unit division: 0
Unit division: 0

## 2019-03-08 LAB — COMPREHENSIVE METABOLIC PANEL
ALT: 25 U/L (ref 0–44)
AST: 34 U/L (ref 15–41)
Albumin: 3 g/dL — ABNORMAL LOW (ref 3.5–5.0)
Alkaline Phosphatase: 57 U/L (ref 38–126)
Anion gap: 8 (ref 5–15)
BUN: 14 mg/dL (ref 6–20)
CO2: 28 mmol/L (ref 22–32)
Calcium: 9.2 mg/dL (ref 8.9–10.3)
Chloride: 104 mmol/L (ref 98–111)
Creatinine, Ser: 0.84 mg/dL (ref 0.44–1.00)
GFR calc Af Amer: >60 mL/min (ref >60)
GFR calc non Af Amer: >60 mL/min (ref >60)
Glucose, Bld: 168 mg/dL — ABNORMAL HIGH (ref 70–99)
Potassium: 3.3 mmol/L — ABNORMAL LOW (ref 3.5–5.1)
Sodium: 140 mmol/L (ref 135–145)
Total Bilirubin: 1.1 mg/dL (ref 0.3–1.2)
Total Protein: 5.4 g/dL — ABNORMAL LOW (ref 6.5–8.1)

## 2019-03-08 LAB — BASIC METABOLIC PANEL
Anion gap: 12 (ref 5–15)
BUN: 14 mg/dL (ref 6–20)
CO2: 26 mmol/L (ref 22–32)
Calcium: 9.4 mg/dL (ref 8.9–10.3)
Chloride: 100 mmol/L (ref 98–111)
Creatinine, Ser: 0.7 mg/dL (ref 0.44–1.00)
GFR calc Af Amer: >60 mL/min (ref >60)
GFR calc non Af Amer: >60 mL/min (ref >60)
Glucose, Bld: 304 mg/dL — ABNORMAL HIGH (ref 70–99)
Potassium: 3.3 mmol/L — ABNORMAL LOW (ref 3.5–5.1)
Sodium: 138 mmol/L (ref 135–145)

## 2019-03-08 LAB — RETICULOCYTES
Immature Retic Fract: 30.6 % — ABNORMAL HIGH (ref 2.3–15.9)
RBC.: 2.27 MIL/uL — ABNORMAL LOW (ref 3.87–5.11)
Retic Count, Absolute: 190.7 10*3/uL — ABNORMAL HIGH (ref 19.0–186.0)
Retic Ct Pct: 8.4 % — ABNORMAL HIGH (ref 0.4–3.1)

## 2019-03-08 LAB — CBC
HCT: 23.3 % — ABNORMAL LOW (ref 36.0–46.0)
Hemoglobin: 7.6 g/dL — ABNORMAL LOW (ref 12.0–15.0)
MCH: 33.2 pg (ref 26.0–34.0)
MCHC: 32.6 g/dL (ref 30.0–36.0)
MCV: 101.7 fL — ABNORMAL HIGH (ref 80.0–100.0)
Platelets: 31 10*3/uL — ABNORMAL LOW (ref 150–400)
RBC: 2.29 MIL/uL — ABNORMAL LOW (ref 3.87–5.11)
RDW: 19.9 % — ABNORMAL HIGH (ref 11.5–15.5)
WBC: 5 10*3/uL (ref 4.0–10.5)
nRBC: 1.4 % — ABNORMAL HIGH (ref 0.0–0.2)

## 2019-03-08 LAB — LACTATE DEHYDROGENASE: LDH: 229 U/L — ABNORMAL HIGH (ref 98–192)

## 2019-03-08 LAB — CULTURE, BLOOD (ROUTINE X 2)
Culture: NO GROWTH
Culture: NO GROWTH
Special Requests: ADEQUATE

## 2019-03-08 LAB — HEMOGLOBIN AND HEMATOCRIT, BLOOD
HCT: 30 % — ABNORMAL LOW (ref 36.0–46.0)
Hemoglobin: 10 g/dL — ABNORMAL LOW (ref 12.0–15.0)

## 2019-03-08 LAB — GLUCOSE, CAPILLARY
Glucose-Capillary: 157 mg/dL — ABNORMAL HIGH (ref 70–99)
Glucose-Capillary: 374 mg/dL — ABNORMAL HIGH (ref 70–99)
Glucose-Capillary: 322 mg/dL — ABNORMAL HIGH (ref 70–99)

## 2019-03-08 MED ORDER — ANTICOAGULANT SODIUM CITRATE 4% (200MG/5ML) IV SOLN
5.0000 mL | Freq: Once | Status: AC
  Administered 2019-03-08: 5 mL
  Filled 2019-03-08: qty 5

## 2019-03-08 MED ORDER — FOLIC ACID 1 MG PO TABS
2.0000 mg | ORAL_TABLET | Freq: Every day | ORAL | Status: DC
  Administered 2019-03-08 – 2019-03-15 (×8): 2 mg via ORAL
  Filled 2019-03-08 (×8): qty 2

## 2019-03-08 MED ORDER — ACD FORMULA A 0.73-2.45-2.2 GM/100ML VI SOLN
Status: AC
  Filled 2019-03-08: qty 500

## 2019-03-08 MED ORDER — CALCIUM CARBONATE ANTACID 500 MG PO CHEW
2.0000 | CHEWABLE_TABLET | ORAL | Status: DC
  Administered 2019-03-08: 400 mg via ORAL

## 2019-03-08 MED ORDER — SODIUM CHLORIDE 0.9 % IV SOLN
2.0000 g | Freq: Once | INTRAVENOUS | Status: AC
  Administered 2019-03-08: 2 g via INTRAVENOUS
  Filled 2019-03-08: qty 20

## 2019-03-08 MED ORDER — ACD FORMULA A 0.73-2.45-2.2 GM/100ML VI SOLN
500.0000 mL | Status: DC
  Administered 2019-03-08: 500 mL via INTRAVENOUS

## 2019-03-08 MED ORDER — CALCIUM CARBONATE ANTACID 500 MG PO CHEW
CHEWABLE_TABLET | ORAL | Status: AC
  Administered 2019-03-09: 400 mg via ORAL
  Filled 2019-03-08: qty 2

## 2019-03-08 MED ORDER — FUROSEMIDE 10 MG/ML IJ SOLN
20.0000 mg | Freq: Once | INTRAMUSCULAR | Status: AC
  Administered 2019-03-08: 20 mg via INTRAVENOUS
  Filled 2019-03-08 (×2): qty 2

## 2019-03-08 MED ORDER — SODIUM CHLORIDE 0.9% IV SOLUTION: Freq: Once | INTRAVENOUS | Status: DC

## 2019-03-08 MED ORDER — DIPHENHYDRAMINE HCL 25 MG PO CAPS: 25.0000 mg | ORAL_CAPSULE | Freq: Four times a day (QID) | ORAL | Status: DC | PRN

## 2019-03-08 MED ORDER — ACETAMINOPHEN 325 MG PO TABS: 650.0000 mg | ORAL_TABLET | ORAL | Status: DC | PRN

## 2019-03-08 MED ORDER — INSULIN ASPART 100 UNIT/ML ~~LOC~~ SOLN
7.0000 [IU] | Freq: Once | SUBCUTANEOUS | Status: AC
  Administered 2019-03-08: 7 [IU] via SUBCUTANEOUS

## 2019-03-08 NOTE — Progress Notes (Addendum)
Inpatient Diabetes Program Recommendations  AACE/ADA: New Consensus Statement on Inpatient Glycemic Control (2015)  Target Ranges:  Prepandial:   less than 140 mg/dL      Peak postprandial:   less than 180 mg/dL (1-2 hours)      Critically ill patients:  140 - 180 mg/dL   Lab Results  Component Value Date   GLUCAP 374 (H) 03/08/2019   HGBA1C 4.3 (L) 03/05/2019    Review of Glycemic Control  Post-prandials elevated. Needs meal coverage insulin. Did not eat lunch meal until 1630. Had supplement (Glucerna) prior to meal  Inpatient Diabetes Program Recommendations:     Add Novolog 2-3 units tidwc for meal coverage insulin.  Watch closely since steroids discontinued.  Continue to follow.  Thank you. Lorenda Peck, RD, LDN, CDE Inpatient Diabetes Coordinator [phone]

## 2019-03-08 NOTE — Progress Notes (Signed)
Patient is in hemodialysis

## 2019-03-08 NOTE — Plan of Care (Signed)

## 2019-03-08 NOTE — Progress Notes (Signed)
So far, I think plasma exchange is still doing okay.  She had little bit of a temperature yesterday but this was after her plasma exchange.  Her LDH is down to 229.  Her bilirubin is down to 1.1.  Her platelet count is holding steady at 31,000.  Her white cell count is 5.  Her hemoglobin is 7.6.  Her corrected reticulocyte count is down to about 5%.  I still believe that we have to continue daily plasma exchange.  I think an issue is going to be how high her counts out she will get.  She has underlying NASH.  She has underlying thrombocytopenia.  As such, I would not expect that her platelet count would get up to a normal level.  I think that the LDH and the bilirubin are good indicators of effectiveness.  She is a bit more anemic.  This could be a dilutional effect from the plasma exchange.  I will put her on some folic acid.  Her physical exam is basically unchanged.  Her vital signs are temperature 98.6.  Pulse 80.  Blood pressure 125/72.  Again, we will continue daily plasma exchange.  She may need to have a red cell transfusion.  I am not sure what the dialysis requirements are for hemoglobin with respect to plasma exchange.  I very much appreciate everybody's great care of Vicki [name].  As always, we started the new year off with a wonderful prayer.   Lattie Haw, MD  Darlyn Chamber 29:11

## 2019-03-08 NOTE — Progress Notes (Signed)
PROGRESS NOTE  Vicki [name] FXT:[id number] DOB: 27-Apr-1960 DOA: 03/03/2019 PCP: Lesleigh Noe, MD  HPI/Recap of past 24 hours: HPI: Vicki [name] is a 59 y.o. female with medical history significant of NASH, thrombocytopenia, recurrent skin boils who presented to the ED due to generalized body aches, fever at home and spontaneous bruising. Has followed with Dr. Irene Limbo hematology in the past year due to worsening pancytopenia.    ED Course: Tm 101.6.  HGB 6.1, platelets <5, WBC today of 3.5, LDH 1049 and bili is 3.7, AST 125 ALT 45.  Retic count increased.  Seen by hematology Dr. 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  Temporary HD catheter placed by IR and plasma exchange started on 03/05/2019.  03/08/19: Seen and examined.  Reports right eye pain that has spontaneously resolved.  White count and platelet stable however hemoglobin dropped.  Plasma exchange planned today.     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 Marin Olp, [id number] ordered and pending. High suspicion for TTP Right IJ temporary HD catheter placed on 03/05/2019 Started plasma exchange on 03/05/2019 Planned plasma exchange 12/30, 12/31, 03/08/19. Received Solu-Medrol 80 mg daily, last dose was 03/07/2019.  WBC and platelets stable Drop in hemoglobin 7.6; will transfuse less than 7. Appreciate hematology's assistance.  Pancytopenia, improving with transfusions and plasma exchange. Transfuse to maintain hemoglobin greater than 7 Transfuse to maintain platelet count greater than 20,000. Avoid NSAIDs in the setting of thrombocytopenia Continue daily CBCs  Recurrent fever, noted post plasma exchange Blood cultures  negative to date and urine culture negative final.  Steroid-induced hyperglycemia  No longer on Solu-Medrol and CBGs are improved On sensitive insulin sliding scale  Resolved transient elevated blood pressure suspect in the setting of volume expansion from multiple blood products transfusion and steroid use Not on antihypertensive medications at home and no prior history of essential hypertension IV labetalol 5 mg every 6 hours as needed for SBP greater than 170. Blood pressure is currently at goal. Continue to monitor vital signs.  NASH Appears stable Avoid hepatotoxins as possible Follows outpatient    Code Status: Full code  Family Communication: Updated her daughter  Disposition Plan: Patient is currently not appropriate for discharge at this time due to ongoing treatment for presumed TTP.  We will plan to discharge once hematology signs off.  Consultants:  Hematology/oncology Dr. Marin Olp.  Interventional radiology Dr. Earleen Newport.  Procedures:  Right IJ temporary HD catheter placement by interventional radiology on 03/05/2019.  Antimicrobials:  IV vancomycin, DC 03/07/19  DVT prophylaxis:  SCDs.  And ambulation   Objective: Vitals:   03/08/19 1118 03/08/19 1136 03/08/19 1154 03/08/19 1211  BP: 118/74 122/72 138/90 118/71  Pulse: 86 88 88 87  Resp: (!) 22 18 16  (!) 24  Temp: 98.3 F (36.8 C) 98.3 F (36.8 C) 98.4 F (36.9 C) 98.2 F (36.8 C)  TempSrc: Oral Oral Oral Oral  SpO2:      Weight:      Height:        Intake/Output Summary (Last 24 hours) at 03/08/2019 1219 Last data filed at 03/08/2019 0700 Gross per 24 hour  Intake 1005.44 ml  Output 1200 ml  Net -194.56 ml   Filed Weights   03/06/19 0441 03/07/19 0455 03/08/19 2992  Weight: 66.5 kg 67.6 kg 67.6 kg    Exam:  . General: 59 y.o. year-old female pleasant well-developed well-nourished in no acute distress.  Alert and oriented x3.  .  Cardiovascular: Regular rate and rhythm no rubs or  gallops. [name] Respiratory: Clear to auscultation no wheezes or rales. . Abdomen: Soft nontender normal bowel sounds present. Musculoskeletal: Trace lower extremity edema bilaterally.  2/4 pulses in all 4 extremities. Psychiatry: Mood is appropriate for condition and setting.   Data Reviewed: CBC: Recent Labs  Lab 03/03/19 2315 03/04/19 0427 03/05/19 0115 03/06/19 0456 03/06/19 1050 03/07/19 0421 03/07/19 1345 03/08/19 0448  WBC 3.5* 3.2* 2.6* 4.3  --  6.1  --  5.0  NEUTROABS 1.7 1.5* 1.2* 2.9  --  4.2  --   --   HGB 6.0* 5.9* 8.4* 9.5* 8.5* 8.3* 8.8* 7.6*  HCT 18.9* 18.4* 24.9* 27.8* 25.0* 25.0* 26.0* 23.3*  MCV 102.7* 101.7* 97.6 97.9  --  100.8*  --  101.7*  PLT <5* 42* 6* 28*  --  30*  --  31*   Basic Metabolic Panel: Recent Labs  Lab 03/06/19 0900 03/07/19 0421 03/07/19 1230 03/07/19 1345 03/08/19 0448 03/08/19 1009  NA 133* 139 136 140 140 138  K 3.5 3.6 3.8 3.7 3.3* 3.3*  CL 101 104 101 98 104 100  CO2 23 26 25   --  28 26  GLUCOSE 355* 151* 360* 355* 168* 304*  BUN 11 15 14 17 14 14   CREATININE 0.80 0.61 0.82 0.70 0.84 0.70  CALCIUM 9.5 9.7 9.5  --  9.2 9.4   GFR: Estimated Creatinine Clearance: 68.3 mL/min (by C-G formula based on SCr of 0.7 mg/dL). Liver Function Tests: Recent Labs  Lab 03/04/19 0427 03/04/19 1511 03/05/19 0115 03/06/19 0456 03/07/19 0421 03/08/19 0448  AST 122*  --  108* 62* 39 34  ALT 43  --  42 31 27 25   ALKPHOS 56  --  56 55 53 57  BILITOT 3.4* 2.6* 3.6* 2.5* 1.8* 1.1  PROT 7.7  --  7.7 6.9 6.0* 5.4*  ALBUMIN 3.5  --  3.3* 3.5 3.3* 3.0*   Recent Labs  Lab 03/03/19 1933  LIPASE 38   No results for input(s): AMMONIA in the last 168 hours. Coagulation Profile: Recent Labs  Lab 03/03/19 2300  INR 1.3*   Cardiac Enzymes: No results for input(s): CKTOTAL, CKMB, CKMBINDEX, TROPONINI in the last 168 hours. BNP (last 3 results) No results for input(s): PROBNP in the last 8760 hours. HbA1C: Recent Labs    03/05/19 1804   HGBA1C 4.3*   CBG: Recent Labs  Lab 03/07/19 0602 03/07/19 1144 03/07/19 1620 03/07/19 2108 03/08/19 0600  GLUCAP 133* 342* 259* 205* 157*   Lipid Profile: No results for input(s): CHOL, HDL, LDLCALC, TRIG, CHOLHDL, LDLDIRECT in the last 72 hours. Thyroid Function Tests: No results for input(s): TSH, T4TOTAL, FREET4, T3FREE, THYROIDAB in the last 72 hours. Anemia Panel: Recent Labs    03/07/19 0421 03/08/19 0448  RETICCTPCT 7.7* 8.4*   Urine analysis:    Component Value Date/Time   COLORURINE YELLOW 03/04/2019 0024   APPEARANCEUR CLEAR 03/04/2019 0024   LABSPEC 1.005 03/04/2019 0024   PHURINE 7.0 03/04/2019 0024   GLUCOSEU NEGATIVE 03/04/2019 0024   HGBUR MODERATE (A) 03/04/2019 0024   BILIRUBINUR NEGATIVE 03/04/2019 0024   KETONESUR NEGATIVE 03/04/2019 0024   PROTEINUR NEGATIVE 03/04/2019 0024   NITRITE NEGATIVE 03/04/2019 0024   LEUKOCYTESUR NEGATIVE 03/04/2019 0024   Sepsis Labs: @  LABRCNTIP(procalcitonin:4,lacticidven:4)  ) Recent Results (from the past 240 hour(s))  Blood Culture (routine x 2)     Status: None (Preliminary result)   Collection Time: 03/03/19 10:00 PM   Specimen: BLOOD  Result Value Ref Range Status   Specimen Description BLOOD RIGHT ANTECUBITAL  Final   Special Requests   Final    BOTTLES DRAWN AEROBIC AND ANAEROBIC Blood Culture adequate volume   Culture   Final    NO GROWTH 4 DAYS Performed at [facility], [address], [place], [place] [address]    Report Status PENDING  Incomplete  Blood Culture (routine x 2)     Status: None (Preliminary result)   Collection Time: 03/03/19 10:38 PM   Specimen: BLOOD  Result Value Ref Range Status   Specimen Description BLOOD LEFT ANTECUBITAL  Final   Special Requests   Final    BOTTLES DRAWN AEROBIC AND ANAEROBIC Blood Culture results may not be optimal due to an excessive volume of blood received in culture bottles   Culture   Final    NO GROWTH 4 DAYS Performed at [facility], [place] [address], [place], [place] [address]    Report Status PENDING  Incomplete  Respiratory Panel by RT PCR (Flu A&B, Covid) - Nasopharyngeal Swab     Status: None   Collection Time: 03/03/19 11:19 PM   Specimen: Nasopharyngeal Swab  Result Value Ref Range Status   SARS Coronavirus 2 by RT PCR NEGATIVE NEGATIVE Final    Comment: (NOTE) SARS-CoV-2 target nucleic acids are NOT DETECTED. The SARS-CoV-2 RNA is generally detectable in upper respiratoy specimens during the acute phase of infection. The lowest concentration of SARS-CoV-2 viral copies this assay can detect is 131 copies/mL. A negative result does not preclude SARS-Cov-2 infection and should not be used as the sole basis for treatment or other patient management decisions. A negative result may occur with  improper specimen collection/handling, submission of specimen other than nasopharyngeal swab, presence of viral mutation(s) within the areas targeted by this assay, and inadequate number of viral copies (<131 copies/mL). A negative result must be combined with clinical observations, patient history, and epidemiological information. The expected result is Negative. Fact Sheet for Patients:  PinkCheek.be Fact Sheet for Healthcare Providers:  GravelBags.it This test is not yet ap proved or cleared by the [place] FDA and  has been authorized for detection and/or diagnosis of SARS-CoV-2 by FDA under an Emergency Use Authorization (EUA). This EUA will remain  in effect (meaning this test can be used) for the duration of the COVID-19 declaration under Section 564(b)(1) of the Act, 21 U.S.C. section 360bbb-3(b)(1), unless the authorization is terminated or revoked sooner.    Influenza A by PCR NEGATIVE NEGATIVE Final   Influenza B by PCR NEGATIVE NEGATIVE Final    Comment: (NOTE) The Xpert Xpress SARS-CoV-2/FLU/RSV assay is intended as an aid in  the  diagnosis of influenza from Nasopharyngeal swab specimens and  should not be used as a sole basis for treatment. Nasal washings and  aspirates are unacceptable for Xpert Xpress SARS-CoV-2/FLU/RSV  testing. Fact Sheet for Patients: PinkCheek.be Fact Sheet for Healthcare Providers: GravelBags.it This test is not yet approved or cleared by the [place] FDA and  has been authorized for detection and/or diagnosis of SARS-CoV-2 by  FDA under an Emergency Use Authorization (EUA). This EUA will remain  in effect (meaning this test can be used) for the duration of the  Covid-19 declaration under Section  564(b)(1) of the Act, 21  U.S.C. section 360bbb-3(b)(1), unless the authorization is  terminated or revoked. Performed at [facility], [place] [address], [place], [place] [address]   Urine culture     Status: None   Collection Time: 03/04/19 12:24 AM   Specimen: Urine, Random  Result Value Ref Range Status   Specimen Description URINE, RANDOM  Final   Special Requests NONE  Final   Culture   Final    NO GROWTH Performed at [facility], [place] [address], [place], [place] [address]    Report Status 03/04/2019 FINAL  Final  MRSA PCR Screening     Status: None   Collection Time: 03/04/19 12:18 PM   Specimen: Nasal Mucosa; Nasopharyngeal  Result Value Ref Range Status   MRSA by PCR NEGATIVE NEGATIVE Final    Comment:        The GeneXpert MRSA Assay (FDA approved for NASAL specimens only), is one component of a comprehensive MRSA colonization surveillance program. It is not intended to diagnose MRSA infection nor to guide or monitor treatment for MRSA infections. Performed at [facility], [place] [address], [place], [place] [address]       Studies: No results found.  Scheduled Meds: . sodium chloride   Intravenous Once  . sodium chloride   Intravenous Once  . calcium carbonate      . calcium carbonate   2 tablet Oral Q3H  . Chlorhexidine Gluconate Cloth  6 each Topical Daily  . feeding supplement (ENSURE ENLIVE)  237 mL Oral BID BM  . feeding supplement (GLUCERNA SHAKE)  237 mL Oral BID BM  . folic acid  2 mg Oral Daily  . furosemide  20 mg Intravenous Once  . insulin aspart  0-15 Units Subcutaneous TID WC  . insulin aspart  0-5 Units Subcutaneous QHS  . senna-docusate  2 tablet Oral BID  . sodium chloride flush  3 mL Intravenous Once    Continuous Infusions: . sodium chloride 250 mL (03/05/19 0058)  . anticoagulant sodium citrate    . calcium gluconate IVPB 2 g (03/08/19 1045)  . citrate dextrose    . citrate dextrose       LOS: 4 days     Kayleen Memos, MD [facility] Pager [phone]  If 7PM-7AM, please contact night-coverage [email or web address] Password [facility] 03/08/2019, 12:19 PM

## 2019-03-08 NOTE — Progress Notes (Signed)
Pt returned back to room and just ate her lunch. Blood sugar was taken and was 374, Per sliding scale insulin dose she would receive 15 units sq. Notified MD Dr. Irene Pap. She placed new order for 7 units at this time. Patient did eat 100 % of lunch and bottle of ensure,

## 2019-03-09 LAB — TYPE AND SCREEN
ABO/RH(D): O POS
Antibody Screen: NEGATIVE
Unit division: 0

## 2019-03-09 LAB — THERAPEUTIC PLASMA EXCHANGE (BLOOD BANK)
Plasma Exchange: 2522
Plasma volume needed: 2522
Unit division: 0
Unit division: 0
Unit division: 0
Unit division: 0
Unit division: 0
Unit division: 0
Unit division: 0
Unit division: 0

## 2019-03-09 LAB — CBC
HCT: 27 % — ABNORMAL LOW (ref 36.0–46.0)
Hemoglobin: 9.1 g/dL — ABNORMAL LOW (ref 12.0–15.0)
MCH: 33.8 pg (ref 26.0–34.0)
MCHC: 33.7 g/dL (ref 30.0–36.0)
MCV: 100.4 fL — ABNORMAL HIGH (ref 80.0–100.0)
Platelets: 43 10*3/uL — ABNORMAL LOW (ref 150–400)
RBC: 2.69 MIL/uL — ABNORMAL LOW (ref 3.87–5.11)
RDW: 18.4 % — ABNORMAL HIGH (ref 11.5–15.5)
WBC: 4.7 10*3/uL (ref 4.0–10.5)
nRBC: 1.7 % — ABNORMAL HIGH (ref 0.0–0.2)

## 2019-03-09 LAB — COMPREHENSIVE METABOLIC PANEL
ALT: 32 U/L (ref 0–44)
AST: 43 U/L — ABNORMAL HIGH (ref 15–41)
Albumin: 3.1 g/dL — ABNORMAL LOW (ref 3.5–5.0)
Alkaline Phosphatase: 67 U/L (ref 38–126)
Anion gap: 9 (ref 5–15)
BUN: 13 mg/dL (ref 6–20)
CO2: 30 mmol/L (ref 22–32)
Calcium: 8.9 mg/dL (ref 8.9–10.3)
Chloride: 99 mmol/L (ref 98–111)
Creatinine, Ser: 0.7 mg/dL (ref 0.44–1.00)
GFR calc Af Amer: >60 mL/min (ref >60)
GFR calc non Af Amer: >60 mL/min (ref >60)
Glucose, Bld: 207 mg/dL — ABNORMAL HIGH (ref 70–99)
Potassium: 3.1 mmol/L — ABNORMAL LOW (ref 3.5–5.1)
Sodium: 138 mmol/L (ref 135–145)
Total Bilirubin: 1.2 mg/dL (ref 0.3–1.2)
Total Protein: 5.4 g/dL — ABNORMAL LOW (ref 6.5–8.1)

## 2019-03-09 LAB — RETICULOCYTES
Immature Retic Fract: 22.9 % — ABNORMAL HIGH (ref 2.3–15.9)
RBC.: 2.7 MIL/uL — ABNORMAL LOW (ref 3.87–5.11)
Retic Count, Absolute: 176.6 10*3/uL (ref 19.0–186.0)
Retic Ct Pct: 6.5 % — ABNORMAL HIGH (ref 0.4–3.1)

## 2019-03-09 LAB — BASIC METABOLIC PANEL
Anion gap: 9 (ref 5–15)
BUN: 12 mg/dL (ref 6–20)
CO2: 29 mmol/L (ref 22–32)
Calcium: 9.1 mg/dL (ref 8.9–10.3)
Chloride: 101 mmol/L (ref 98–111)
Creatinine, Ser: 0.73 mg/dL (ref 0.44–1.00)
GFR calc Af Amer: >60 mL/min (ref >60)
GFR calc non Af Amer: >60 mL/min (ref >60)
Glucose, Bld: 220 mg/dL — ABNORMAL HIGH (ref 70–99)
Potassium: 3.6 mmol/L (ref 3.5–5.1)
Sodium: 139 mmol/L (ref 135–145)

## 2019-03-09 LAB — LACTATE DEHYDROGENASE: LDH: 245 U/L — ABNORMAL HIGH (ref 98–192)

## 2019-03-09 LAB — GLUCOSE, CAPILLARY
Glucose-Capillary: 152 mg/dL — ABNORMAL HIGH (ref 70–99)
Glucose-Capillary: 268 mg/dL — ABNORMAL HIGH (ref 70–99)
Glucose-Capillary: 171 mg/dL — ABNORMAL HIGH (ref 70–99)

## 2019-03-09 LAB — BPAM RBC
Blood Product Expiration Date: 202101302359
ISSUE DATE / TIME: 202101011546
Unit Type and Rh: 5100

## 2019-03-09 MED ORDER — ANTICOAGULANT SODIUM CITRATE 4% (200MG/5ML) IV SOLN
5.0000 mL | Freq: Once | Status: AC
  Administered 2019-03-09: 5 mL
  Filled 2019-03-09: qty 5

## 2019-03-09 MED ORDER — ACETAMINOPHEN 325 MG PO TABS: 650.0000 mg | ORAL_TABLET | ORAL | Status: DC | PRN

## 2019-03-09 MED ORDER — CALCIUM CARBONATE ANTACID 500 MG PO CHEW
CHEWABLE_TABLET | ORAL | Status: AC
  Administered 2019-03-09: 400 mg via ORAL
  Filled 2019-03-09: qty 4

## 2019-03-09 MED ORDER — POTASSIUM CHLORIDE CRYS ER 20 MEQ PO TBCR
40.0000 meq | EXTENDED_RELEASE_TABLET | ORAL | Status: AC
  Administered 2019-03-09 (×2): 40 meq via ORAL
  Filled 2019-03-09 (×2): qty 2

## 2019-03-09 MED ORDER — ACD FORMULA A 0.73-2.45-2.2 GM/100ML VI SOLN
500.0000 mL | Status: DC
  Filled 2019-03-09: qty 500

## 2019-03-09 MED ORDER — SODIUM CHLORIDE 0.9 % IV SOLN
2.0000 g | Freq: Once | INTRAVENOUS | Status: AC
  Administered 2019-03-09: 2 g via INTRAVENOUS
  Filled 2019-03-09: qty 20

## 2019-03-09 MED ORDER — CALCIUM CARBONATE ANTACID 500 MG PO CHEW
2.0000 | CHEWABLE_TABLET | ORAL | Status: AC
  Filled 2019-03-09: qty 2

## 2019-03-09 MED ORDER — DIPHENHYDRAMINE HCL 25 MG PO CAPS: 25.0000 mg | ORAL_CAPSULE | Freq: Four times a day (QID) | ORAL | Status: DC | PRN

## 2019-03-09 MED ORDER — ALUM & MAG HYDROXIDE-SIMETH 200-200-20 MG/5ML PO SUSP: 30.0000 mL | ORAL | Status: DC | PRN

## 2019-03-09 MED ORDER — ACD FORMULA A 0.73-2.45-2.2 GM/100ML VI SOLN
Status: AC
  Administered 2019-03-09: 500 mL via INTRAVENOUS
  Filled 2019-03-09: qty 500

## 2019-03-09 NOTE — Progress Notes (Signed)
CHG bath completed on patient early this AM since patient plans for plasma exchange in HD. Unable to document on MAR due to scheduled time of 1000.  Documented on flowsheets and will inform day shift RN.

## 2019-03-09 NOTE — Progress Notes (Signed)
Subjective: The patient is seen and examined today.  She is feeling fine today with no concerning complaints.  She undergoing plasma exchange on daily basis and tolerating it fairly well.  She is scheduled to have one today.  Her platelets count are improving slowly.  She denied having any current bleeding, bruises or ecchymosis.  She has no fever or chills.  Objective: Vital signs in last 24 hours: Temp:  [97.9 F (36.6 C)-98.7 F (37.1 C)] 98.4 F (36.9 C) (01/02 0635) Pulse Rate:  [77-97] 77 (01/02 0635) Resp:  [14-26] 20 (01/02 0635) BP: (118-144)/(67-90) 122/71 (01/02 0635) SpO2:  [98 %-100 %] 100 % (01/02 0635) Weight:  [151 lb 11.2 oz (68.8 kg)] 151 lb 11.2 oz (68.8 kg) (01/02 0402)  Intake/Output from previous day: 01/01 0701 - 01/02 0700 In: 1018 [P.O.:690; Blood:328] Out: 1975 [id number] Intake/Output this shift: No intake/output data recorded.  General appearance: alert, cooperative and no distress Resp: clear to auscultation bilaterally Cardio: regular rate and rhythm, S1, S2 normal, no murmur, click, rub or gallop GI: soft, non-tender; bowel sounds normal; no masses,  no organomegaly Extremities: extremities normal, atraumatic, no cyanosis or edema  Lab Results:  Recent Labs    03/08/19 0448 03/08/19 2144 03/09/19 0344  WBC 5.0  --  4.7  HGB 7.6* 10.0* 9.1*  HCT 23.3* 30.0* 27.0*  PLT 31*  --  43*   BMET Recent Labs    03/08/19 1009 03/09/19 0344  NA 138 138  K 3.3* 3.1*  CL 100 99  CO2 26 30  GLUCOSE 304* 207*  BUN 14 13  CREATININE 0.70 0.70  CALCIUM 9.4 8.9    Studies/Results: No results found.  Medications: I have reviewed the patient's current medications.  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 Her platelets count are up to 43,000 today.  The patient  continues to improve slowly. For the anemia she received PRBCs transfusion yesterday and her hemoglobin is better today. I recommended for the patient to continue her current treatment with plasma exchange on daily basis until normalization of her platelets count then we will gradually decrease the frequency of the plasma exchange. I will see the patient tomorrow for evaluation and Dr. Marin Olp will resume her care on Monday. Please call if you have any questions.   LOS: 5 days    Eilleen Kempf 03/09/2019

## 2019-03-09 NOTE — Progress Notes (Signed)
PROGRESS NOTE  Vicki [name] RDE:[id number] DOB: [birth year] DOA: 03/03/2019 PCP: Vicki Noe, MD  Brief History   Vicki [name] a 59 y.o.femalewith medical history significant ofNASH, thrombocytopenia, recurrent skin boils who presented to the ED due to generalized body aches, fever at home and spontaneous bruising. Has followed with Vicki [name] hematology in the past year due to worsening pancytopenia.    ED Course:Tm 101.6. HGB 6.1, platelets <5, WBC today of 3.5, LDH 1049 and bili is 3.7,AST 125 ALT 45.  Retic count increased.  Seen by hematology Dr. Marin [name], TTP suspected.  Temporary HD catheter placed by IR and plasma exchange started on 03/05/2019.  Pt went for plasma exchange today.  Consultants   Medical oncology  Interventional radiology  Procedures   Temporary HD catheter placement  Plasma exchange  Transfusion of 4 units PRBC's and 3 pheresis packs of platelets since admission.  Antibiotics   Anti-infectives (From admission, onward)   Start     Dose/Rate Route Frequency Ordered Stop   03/04/19 1000  vancomycin (VANCOREADY) IVPB 1250 mg/250 mL  Status:  Discontinued     1,250 mg 166.7 mL/hr over 90 Minutes Intravenous Every 24 hours 03/04/19 0917 03/07/19 1117   03/04/19 0930  ceFEPIme (MAXIPIME) 2 g in sodium chloride 0.9 % 100 mL IVPB  Status:  Discontinued     2 g 200 mL/hr over 30 Minutes Intravenous Every 8 hours 03/04/19 0915 03/05/19 0717   03/04/19 0030  vancomycin (VANCOREADY) IVPB 1250 mg/250 mL  Status:  Discontinued     1,250 mg 166.7 mL/hr over 90 Minutes Intravenous  Once 03/04/19 0018 03/04/19 0021   03/04/19 0030  ceFEPIme (MAXIPIME) 2 g in sodium chloride 0.9 % 100 mL IVPB  Status:  Discontinued     2 g 200 mL/hr over 30 Minutes Intravenous  Once 03/04/19 0018 03/04/19 0021       Subjective  The patient is resting comfortably. No new complaints.   Objective   Vitals:  Vitals:   03/09/19 0635 03/09/19 1241  BP: 122/71 131/76   Pulse: 77 81  Resp: 20 18  Temp: 98.4 F (36.9 C) 99.1 F (37.3 C)  SpO2: 100% 100%   Exam:  Constitutional:   The patient is awake, alert, and oriented x 3. No acute distress. Respiratory:   No increased work of breathing.  No wheezes, rales, or rhonchi  No tactile fremitus Cardiovascular:   Regular rate and rhythm  No murmurs, ectopy, or gallups.  No lateral PMI. No thrills. Abdomen:   Abdomen is soft, non-tender, non-distended  No hernias, masses, or organomegaly  Normoactive bowel sounds.  Musculoskeletal:   No cyanosis, clubbing, or edema Skin:   No rashes, lesions, ulcers  palpation of skin: no induration or nodules Neurologic:   CN 2-12 intact  Sensation all 4 extremities intact Psychiatric:   Mental status o Mood, affect appropriate o Orientation to person, place, time   judgment and insight appear intact  I have personally reviewed the following:   Today's Data   Vitals, BMP x 2, CBC, Glucoses  Scheduled Meds:  sodium chloride   Intravenous Once   sodium chloride   Intravenous Once   calcium carbonate  2 tablet Oral Q3H   Chlorhexidine Gluconate Cloth  6 each Topical Daily   feeding supplement (GLUCERNA SHAKE)  237 mL Oral BID BM   folic acid  2 mg Oral Daily   insulin aspart  0-15 Units Subcutaneous TID WC   insulin aspart  0-5  Units Subcutaneous QHS   senna-docusate  2 tablet Oral BID   sodium chloride flush  3 mL Intravenous Once   Continuous Infusions:  sodium chloride 250 mL (03/05/19 0058)   anticoagulant sodium citrate     calcium gluconate IVPB     citrate dextrose      Principal Problem:   Acquired pancytopenia (HCC) Active Problems:   Controlled type 2 diabetes mellitus without complication (HCC)   Elevated liver enzymes   Liver cirrhosis secondary to NASH ([place])   LOS: 5 days   A & P  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 Marin [name], [id number] ordered and pending. High suspicion for TTP Right IJ temporary HD catheter placed on 03/05/2019 Started plasma exchange on 03/05/2019 Planned plasma exchange 12/30, 12/31, 03/08/19. Received Solu-Medrol 80 mg daily, last dose was 03/07/2019.  WBC and platelets stable Drop in hemoglobin 7.6; will transfuse less than 7. Appreciate hematology's assistance.  Pancytopenia, improving with transfusions and plasma exchange. Transfuse to maintain hemoglobin greater than 7 Transfuse to maintain platelet count greater than 20,000. Avoid NSAIDs in the setting of thrombocytopenia Continue daily CBCs  Recurrent fever, noted post plasma exchange Blood cultures negative to date and urine culture negative final.  Steroid-induced hyperglycemia  No longer on Solu-Medrol and CBGs are improved On sensitive insulin sliding scale  Resolved transient elevated blood pressure suspect in the setting of volume expansion from multiple blood products transfusion and steroid use Not on antihypertensive medications at home and no prior history of essential hypertension IV labetalol 5 mg every 6 hours as needed for SBP greater than 170. Blood pressure is currently at goal. Continue to monitor vital signs.  NASH Appears stable Avoid hepatotoxins as possible Follows outpatient  I have seen and examined this patient myself. I have spent 34 minutes in her evaluation and care.  Code Status: Full code  Family Communication: Updated her daughter  Disposition Plan: Patient is currently not appropriate for discharge at this time due to ongoing treatment for presumed TTP.  We will plan to discharge once hematology signs off.   Vicki Kunde, DO [facility] Direct contact: see [email or web address]  7PM-7AM contact night coverage as above 03/09/2019, 6:10 PM  LOS: 5 days

## 2019-03-10 LAB — GLUCOSE, CAPILLARY
Glucose-Capillary: 171 mg/dL — ABNORMAL HIGH (ref 70–99)
Glucose-Capillary: 149 mg/dL — ABNORMAL HIGH (ref 70–99)
Glucose-Capillary: 202 mg/dL — ABNORMAL HIGH (ref 70–99)
Glucose-Capillary: 201 mg/dL — ABNORMAL HIGH (ref 70–99)
Glucose-Capillary: 188 mg/dL — ABNORMAL HIGH (ref 70–99)

## 2019-03-10 LAB — THERAPEUTIC PLASMA EXCHANGE (BLOOD BANK)
Plasma Exchange: 2522
Plasma volume needed: 2522
Unit division: 0
Unit division: 0
Unit division: 0
Unit division: 0
Unit division: 0
Unit division: 0
Unit division: 0
Unit division: 0

## 2019-03-10 LAB — CBC WITH DIFFERENTIAL/PLATELET
Abs Immature Granulocytes: 0.04 10*3/uL (ref 0.00–0.07)
Basophils Absolute: 0 10*3/uL (ref 0.0–0.1)
Basophils Relative: 0 %
Eosinophils Absolute: 0.1 10*3/uL (ref 0.0–0.5)
Eosinophils Relative: 3 %
HCT: 30.9 % — ABNORMAL LOW (ref 36.0–46.0)
Hemoglobin: 10.2 g/dL — ABNORMAL LOW (ref 12.0–15.0)
Immature Granulocytes: 1 %
Lymphocytes Relative: 26 %
Lymphs Abs: 0.8 10*3/uL (ref 0.7–4.0)
MCH: 34 pg (ref 26.0–34.0)
MCHC: 33 g/dL (ref 30.0–36.0)
MCV: 103 fL — ABNORMAL HIGH (ref 80.0–100.0)
Monocytes Absolute: 0.3 10*3/uL (ref 0.1–1.0)
Monocytes Relative: 10 %
Neutro Abs: 1.8 10*3/uL (ref 1.7–7.7)
Neutrophils Relative %: 60 %
Platelets: 75 10*3/uL — ABNORMAL LOW (ref 150–400)
RBC: 3 MIL/uL — ABNORMAL LOW (ref 3.87–5.11)
RDW: 18.2 % — ABNORMAL HIGH (ref 11.5–15.5)
WBC: 3 10*3/uL — ABNORMAL LOW (ref 4.0–10.5)
nRBC: 0.7 % — ABNORMAL HIGH (ref 0.0–0.2)

## 2019-03-10 LAB — BASIC METABOLIC PANEL
Anion gap: 5 (ref 5–15)
BUN: 8 mg/dL (ref 6–20)
CO2: 29 mmol/L (ref 22–32)
Calcium: 8.9 mg/dL (ref 8.9–10.3)
Chloride: 103 mmol/L (ref 98–111)
Creatinine, Ser: 0.62 mg/dL (ref 0.44–1.00)
GFR calc Af Amer: >60 mL/min (ref >60)
GFR calc non Af Amer: >60 mL/min (ref >60)
Glucose, Bld: 198 mg/dL — ABNORMAL HIGH (ref 70–99)
Potassium: 3.8 mmol/L (ref 3.5–5.1)
Sodium: 137 mmol/L (ref 135–145)

## 2019-03-10 LAB — ADAMTS13 ANTIBODY: ADAMTS13 Antibody: 50 Units/mL — ABNORMAL HIGH (ref <12)

## 2019-03-10 LAB — ADAMTS13 ACTIVITY: Adamts 13 Activity: <2 % — CL

## 2019-03-10 NOTE — Progress Notes (Signed)
Subjective: The patient is seen and examined today.  She is feeling fine today with no concerning complaints.  She denied having any current fever or chills.  She has no bleeding, bruises or ecchymosis.  She has no nausea, vomiting, diarrhea or constipation.  She continues to tolerate her plasma exchange fairly well.  Objective: Vital signs in last 24 hours: Temp:  [98.1 F (36.7 C)-99.2 F (37.3 C)] 99.2 F (37.3 C) (01/03 0429) Pulse Rate:  [79-90] 79 (01/03 0429) Resp:  [17-25] 18 (01/03 0429) BP: (104-135)/(68-89) 120/72 (01/03 0429) SpO2:  [98 %-100 %] 98 % (01/03 0429) Weight:  [151 lb 11.2 oz (68.8 kg)] 151 lb 11.2 oz (68.8 kg) (01/03 0429)  Intake/Output from previous day: 01/02 0701 - 01/03 0700 In: 290 [P.O.:290] Out: 2550 [Urine:2550] Intake/Output this shift: No intake/output data recorded.  General appearance: alert, cooperative and no distress Resp: clear to auscultation bilaterally Cardio: regular rate and rhythm, S1, S2 normal, no murmur, click, rub or gallop GI: soft, non-tender; bowel sounds normal; no masses,  no organomegaly Extremities: extremities normal, atraumatic, no cyanosis or edema  Lab Results:  Recent Labs    03/08/19 0448 03/08/19 2144 03/09/19 0344  WBC 5.0  --  4.7  HGB 7.6* 10.0* 9.1*  HCT 23.3* 30.0* 27.0*  PLT 31*  --  43*   BMET Recent Labs    03/09/19 0344 03/09/19 1611  NA 138 139  K 3.1* 3.6  CL 99 101  CO2 30 29  GLUCOSE 207* 220*  BUN 13 12  CREATININE 0.70 0.73  CALCIUM 8.9 9.1    Studies/Results: No results found.  Medications: I have reviewed the patient's current medications.  Assessment/Plan: This is a very pleasant 59 years old African-American female recently diagnosed with TTP and she is currently undergoing plasma exchange on daily basis.  The patient continues to tolerate her treatment well with no concerning complaints or adverse effects. I recommended for the patient to proceed with plasma exchange  today as planned.  She did not have any blood work performed this morning.  I requested repeat CBC today.  For her exchange. For the history of diabetes mellitus the patient will continue her current treatment with insulin. Thank you for taking good care of Ms. Vitrano.  Dr. Marin Olp will resume her care starting tomorrow. Please call if you have any questions.  LOS: 6 days    Eilleen Kempf 03/10/2019

## 2019-03-10 NOTE — Progress Notes (Signed)
Patient is requesting to know when she will go to HD for her plasma exchange. HD contacted but there is not a specific time for her to be picked up by transport. Patient informed but states she does not want to wait until late in the night like the day before. Will try to follow up with HD and follow up with patient as more information is given.

## 2019-03-10 NOTE — Progress Notes (Signed)
PROGRESS NOTE  Vicki [name] DVV:[id number] DOB: 16-Mar-1960 DOA: 03/03/2019 PCP: Lesleigh Noe, MD  Brief History   Vicki [name] a 59 y.o.femalewith medical history significant ofNASH, thrombocytopenia, recurrent skin boils who presented to the ED due to generalized body aches, fever at home and spontaneous bruising. Has followed with Dr. Irene Limbo hematology in the past year due to worsening pancytopenia.    ED Course:Tm 101.6. HGB 6.1, platelets <5, WBC today of 3.5, LDH 1049 and bili is 3.7,AST 125 ALT 45.  Retic count increased.  Seen by hematology Dr. 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  Temporary HD catheter placed by IR and plasma exchange started on 03/05/2019.  Pt is resting comfortably today. No new complaints.  Consultants  . Medical oncology . Interventional radiology  Procedures  . Temporary HD catheter placement . Plasma exchange . Transfusion of 4 units PRBC's and 3 pheresis packs of platelets since admission.  Antibiotics   Anti-infectives (From admission, onward)   Start     Dose/Rate Route Frequency Ordered Stop   03/04/19 1000  vancomycin (VANCOREADY) IVPB 1250 mg/250 mL  Status:  Discontinued     1,250 mg 166.7 mL/hr over 90 Minutes Intravenous Every 24 hours 03/04/19 0917 03/07/19 1117   03/04/19 0930  ceFEPIme (MAXIPIME) 2 g in sodium chloride 0.9 % 100 mL IVPB  Status:  Discontinued     2 g 200 mL/hr over 30 Minutes Intravenous Every 8 hours 03/04/19 0915 03/05/19 0717   03/04/19 0030  vancomycin (VANCOREADY) IVPB 1250 mg/250 mL  Status:  Discontinued     1,250 mg 166.7 mL/hr over 90 Minutes Intravenous  Once 03/04/19 0018 03/04/19 0021   03/04/19 0030  ceFEPIme (MAXIPIME) 2 g in sodium chloride 0.9 % 100 mL IVPB  Status:  Discontinued     2 g 200 mL/hr over 30 Minutes Intravenous  Once 03/04/19 0018 03/04/19 0021      Subjective  The patient is resting comfortably. No new complaints.   Objective   Vitals:  Vitals:   03/10/19 0429 03/10/19 1140  BP:  120/72 117/66  Pulse: 79 93  Resp: 18 20  Temp: 99.2 F (37.3 C) 98.9 F (37.2 C)  SpO2: 98% 100%   Exam:  Constitutional:  . The patient is awake, alert, and oriented x 3. No acute distress. Respiratory:  . No increased work of breathing. . No wheezes, rales, or rhonchi . No tactile fremitus Cardiovascular:  . Regular rate and rhythm . No murmurs, ectopy, or gallups. . No lateral PMI. No thrills. Abdomen:  . Abdomen is soft, non-tender, non-distended . No hernias, masses, or organomegaly . Normoactive bowel sounds.  Musculoskeletal:  . No cyanosis, clubbing, or edema Skin:  . No rashes, lesions, ulcers . palpation of skin: no induration or nodules Neurologic:  . CN 2-12 intact . Sensation all 4 extremities intact Psychiatric:  . Mental status o Mood, affect appropriate o Orientation to person, place, time  . judgment and insight appear intact  I have personally reviewed the following:   Today's Data  . Vitals, BMP, Glucoses  Scheduled Meds: . sodium chloride   Intravenous Once  . sodium chloride   Intravenous Once  . Chlorhexidine Gluconate Cloth  6 each Topical Daily  . feeding supplement (GLUCERNA SHAKE)  237 mL Oral BID BM  . folic acid  2 mg Oral Daily  . insulin aspart  0-15 Units Subcutaneous TID WC  . insulin aspart  0-5 Units Subcutaneous QHS  . senna-docusate  2 tablet Oral BID  .  sodium chloride flush  3 mL Intravenous Once   Continuous Infusions: . 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 Marin Olp, [id number] ordered and pending. High suspicion for TTP Right IJ temporary HD  catheter placed on 03/05/2019 Started plasma exchange on 03/05/2019 Planned plasma exchange 12/30, 12/31, 03/08/19. Received Solu-Medrol 80 mg daily, last dose was 03/07/2019.  WBC and platelets stable Drop in hemoglobin 7.6; will transfuse less than 7. Appreciate hematology's assistance.  Pancytopenia, improving with transfusions and plasma exchange. Transfuse to maintain hemoglobin greater than 7 Transfuse to maintain platelet count greater than 20,000. Avoid NSAIDs in the setting of thrombocytopenia Continue daily CBCs  Recurrent fever, noted post plasma exchange Blood cultures negative to date and urine culture negative final.  Steroid-induced hyperglycemia  No longer on Solu-Medrol and CBGs are improved On sensitive insulin sliding scale  Resolved transient elevated blood pressure suspect in the setting of volume expansion from multiple blood products transfusion and steroid use Not on antihypertensive medications at home and no prior history of essential hypertension IV labetalol 5 mg every 6 hours as needed for SBP greater than 170. Blood pressure is currently at goal. Continue to monitor vital signs.  NASH Appears stable Avoid hepatotoxins as possible Follows outpatient  I have seen and examined this patient myself. I have spent 32 minutes in her evaluation and care.  Code Status: Full code  Family Communication: None available.  Disposition Plan: Patient is currently not appropriate for discharge at this time due to ongoing treatment for presumed TTP.  We will plan to discharge once hematology signs off.   Monda Chastain, DO [facility] Direct contact: see [email or web address]  7PM-7AM contact night coverage as above 03/10/2019, 6:10 PM  LOS: 5 days

## 2019-03-11 LAB — CBC WITH DIFFERENTIAL/PLATELET
Abs Immature Granulocytes: 0.02 10*3/uL (ref 0.00–0.07)
Basophils Absolute: 0 10*3/uL (ref 0.0–0.1)
Basophils Relative: 0 %
Eosinophils Absolute: 0.1 10*3/uL (ref 0.0–0.5)
Eosinophils Relative: 4 %
HCT: 29.7 % — ABNORMAL LOW (ref 36.0–46.0)
Hemoglobin: 9.8 g/dL — ABNORMAL LOW (ref 12.0–15.0)
Immature Granulocytes: 1 %
Lymphocytes Relative: 31 %
Lymphs Abs: 0.7 10*3/uL (ref 0.7–4.0)
MCH: 33.2 pg (ref 26.0–34.0)
MCHC: 33 g/dL (ref 30.0–36.0)
MCV: 100.7 fL — ABNORMAL HIGH (ref 80.0–100.0)
Monocytes Absolute: 0.3 10*3/uL (ref 0.1–1.0)
Monocytes Relative: 15 %
Neutro Abs: 1.1 10*3/uL — ABNORMAL LOW (ref 1.7–7.7)
Neutrophils Relative %: 49 %
Platelets: 77 10*3/uL — ABNORMAL LOW (ref 150–400)
RBC: 2.95 MIL/uL — ABNORMAL LOW (ref 3.87–5.11)
RDW: 17.4 % — ABNORMAL HIGH (ref 11.5–15.5)
WBC: 2.2 10*3/uL — ABNORMAL LOW (ref 4.0–10.5)
nRBC: 0 % (ref 0.0–0.2)

## 2019-03-11 LAB — COMPREHENSIVE METABOLIC PANEL
ALT: 63 U/L — ABNORMAL HIGH (ref 0–44)
AST: 80 U/L — ABNORMAL HIGH (ref 15–41)
Albumin: 3.3 g/dL — ABNORMAL LOW (ref 3.5–5.0)
Alkaline Phosphatase: 75 U/L (ref 38–126)
Anion gap: 7 (ref 5–15)
BUN: 6 mg/dL (ref 6–20)
CO2: 28 mmol/L (ref 22–32)
Calcium: 8.9 mg/dL (ref 8.9–10.3)
Chloride: 102 mmol/L (ref 98–111)
Creatinine, Ser: 0.6 mg/dL (ref 0.44–1.00)
GFR calc Af Amer: >60 mL/min (ref >60)
GFR calc non Af Amer: >60 mL/min (ref >60)
Glucose, Bld: 163 mg/dL — ABNORMAL HIGH (ref 70–99)
Potassium: 4.1 mmol/L (ref 3.5–5.1)
Sodium: 137 mmol/L (ref 135–145)
Total Bilirubin: 1.2 mg/dL (ref 0.3–1.2)
Total Protein: 6 g/dL — ABNORMAL LOW (ref 6.5–8.1)

## 2019-03-11 LAB — BASIC METABOLIC PANEL
Anion gap: 8 (ref 5–15)
BUN: 6 mg/dL (ref 6–20)
CO2: 27 mmol/L (ref 22–32)
Calcium: 8.8 mg/dL — ABNORMAL LOW (ref 8.9–10.3)
Chloride: 101 mmol/L (ref 98–111)
Creatinine, Ser: 0.77 mg/dL (ref 0.44–1.00)
GFR calc Af Amer: >60 mL/min (ref >60)
GFR calc non Af Amer: >60 mL/min (ref >60)
Glucose, Bld: 238 mg/dL — ABNORMAL HIGH (ref 70–99)
Potassium: 3.8 mmol/L (ref 3.5–5.1)
Sodium: 136 mmol/L (ref 135–145)

## 2019-03-11 LAB — SAVE SMEAR(SSMR), FOR PROVIDER SLIDE REVIEW

## 2019-03-11 LAB — POCT I-STAT, CHEM 8
BUN: 14 mg/dL (ref 6–20)
BUN: 6 mg/dL (ref 6–20)
Calcium, Ion: 1.2 mmol/L (ref 1.15–1.40)
Calcium, Ion: 1.3 mmol/L (ref 1.15–1.40)
Chloride: 98 mmol/L (ref 98–111)
Chloride: 101 mmol/L (ref 98–111)
Creatinine, Ser: 0.6 mg/dL (ref 0.44–1.00)
Creatinine, Ser: 0.7 mg/dL (ref 0.44–1.00)
Glucose, Bld: 312 mg/dL — ABNORMAL HIGH (ref 70–99)
Glucose, Bld: 170 mg/dL — ABNORMAL HIGH (ref 70–99)
HCT: 26 % — ABNORMAL LOW (ref 36.0–46.0)
HCT: 28 % — ABNORMAL LOW (ref 36.0–46.0)
Hemoglobin: 8.8 g/dL — ABNORMAL LOW (ref 12.0–15.0)
Hemoglobin: 9.5 g/dL — ABNORMAL LOW (ref 12.0–15.0)
Potassium: 3.3 mmol/L — ABNORMAL LOW (ref 3.5–5.1)
Potassium: 3.4 mmol/L — ABNORMAL LOW (ref 3.5–5.1)
Sodium: 137 mmol/L (ref 135–145)
Sodium: 139 mmol/L (ref 135–145)
TCO2: 28 mmol/L (ref 22–32)
TCO2: 27 mmol/L (ref 22–32)

## 2019-03-11 LAB — GLUCOSE, CAPILLARY
Glucose-Capillary: 140 mg/dL — ABNORMAL HIGH (ref 70–99)
Glucose-Capillary: 172 mg/dL — ABNORMAL HIGH (ref 70–99)
Glucose-Capillary: 180 mg/dL — ABNORMAL HIGH (ref 70–99)

## 2019-03-11 LAB — LACTATE DEHYDROGENASE: LDH: 234 U/L — ABNORMAL HIGH (ref 98–192)

## 2019-03-11 MED ORDER — ACD FORMULA A 0.73-2.45-2.2 GM/100ML VI SOLN
Status: AC
  Filled 2019-03-11: qty 500

## 2019-03-11 MED ORDER — ACETAMINOPHEN 325 MG PO TABS: 650.0000 mg | ORAL_TABLET | ORAL | Status: DC | PRN

## 2019-03-11 MED ORDER — CALCIUM CARBONATE ANTACID 500 MG PO CHEW
CHEWABLE_TABLET | ORAL | Status: AC
  Administered 2019-03-11: 400 mg
  Filled 2019-03-11: qty 2

## 2019-03-11 MED ORDER — CALCIUM CARBONATE ANTACID 500 MG PO CHEW: 2.0000 | CHEWABLE_TABLET | ORAL | Status: DC

## 2019-03-11 MED ORDER — ACD FORMULA A 0.73-2.45-2.2 GM/100ML VI SOLN
500.0000 mL | Status: DC
  Administered 2019-03-11: 500 mL via INTRAVENOUS
  Filled 2019-03-11 (×2): qty 500

## 2019-03-11 MED ORDER — DIPHENHYDRAMINE HCL 25 MG PO CAPS: 25.0000 mg | ORAL_CAPSULE | Freq: Four times a day (QID) | ORAL | Status: DC | PRN

## 2019-03-11 MED ORDER — ANTICOAGULANT SODIUM CITRATE 4% (200MG/5ML) IV SOLN
5.0000 mL | Freq: Once | Status: AC
  Administered 2019-03-11: 5 mL
  Filled 2019-03-11 (×2): qty 5

## 2019-03-11 MED ORDER — SODIUM CHLORIDE 0.9 % IV SOLN
2.0000 g | INTRAVENOUS | Status: AC
  Administered 2019-03-11: 2 g via INTRAVENOUS
  Filled 2019-03-11 (×2): qty 20

## 2019-03-11 MED ORDER — DIPHENHYDRAMINE HCL 50 MG/ML IJ SOLN
25.0000 mg | Freq: Four times a day (QID) | INTRAMUSCULAR | Status: DC | PRN
  Administered 2019-03-11 – 2019-03-12 (×2): 25 mg via INTRAVENOUS
  Filled 2019-03-11 (×2): qty 1

## 2019-03-11 NOTE — Progress Notes (Signed)
Overall, we are seeing a nice response to the plasma exchange now.  Yesterday, her platelet count was 75,000.  Her hemoglobin was 10.2.  The reticulocyte count, when corrected, was less than 5%.  Her urine is no longer dark.  She did not have plasma exchange yesterday.  We will see what the labs are today.  If we see continued improvement in her blood counts, then we might want to consider every other day plasma exchange.  She is eating okay.  She is having no no obvious fever.  I think her T-max yesterday was 99.5.  She still has these boils in the perineal area.  I would like to think that she is not going to need to be transfused at this point.  Maybe, the peripheral IVs can be taken out.  There is no labs back yet today.  She has had no diarrhea.  She has had no cough.  She has a decent appetite with no nausea or vomiting.  On her physical exam, her temperature is 99.  Pulse 92.  Blood pressure 126/78.  Head neck exam shows no scleral icterus.  There is no oral lesions.  She has no adenopathy in the neck.  Lungs are clear bilaterally.  Cardiac exam regular rate and rhythm.  Abdomen is soft.  Bowel sounds are present.  There is no guarding or rebound tenderness.  There is no fluid wave.  Extremities shows good strength in upper and lower extremities.  She has good range of motion.  Skin exam shows no rashes.  Neurological exam is nonfocal.  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  We will get her blood smear today.  Hopefully will see schistocytes are no longer present.  She will have her plasma exchange today.  If we see that her blood counts continue to improve, then we will try every other day exchange.  Again, given that she has underlying cirrhosis, was hard to say how high we will be able to get her blood counts.  She was may have an element of thrombocytopenia.  As always, we had a very good prayer session today.  Her faith remains very strong.  I appreciate everybody's help on 3 E. and in the  dialysis unit.  You all have done a tremendous job with her.  Lattie Haw, MD  Philippians 3:13-14

## 2019-03-11 NOTE — Progress Notes (Signed)
PROGRESS NOTE  Vicki [name] QMV:[id number] DOB: Oct 10, 1960 DOA: 03/03/2019 PCP: Lesleigh Noe, MD  Brief History   Vicki [name] a 59 y.o.femalewith medical history significant ofNASH, thrombocytopenia, recurrent skin boils who presented to the ED due to generalized body aches, fever at home and spontaneous bruising. Has followed with Dr. Irene Limbo hematology in the past year due to worsening pancytopenia.    ED Course:Tm 101.6. HGB 6.1, platelets <5, WBC today of 3.5, LDH 1049 and bili is 3.7,AST 125 ALT 45.  Retic count increased.  Seen by hematology Dr. 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  Temporary HD catheter placed by IR and plasma exchange started on 03/05/2019.  Pt is resting comfortably today. No new complaints. She did not require plasma exchange yesterday. Hemeatology has recommended that perhaps the patient could go for plasma exchange every other day.  Consultants  . Medical oncology . Interventional radiology  Procedures  . Temporary HD catheter placement . Plasma exchange . Transfusion of 4 units PRBC's and 3 pheresis packs of platelets since admission.  Antibiotics   Anti-infectives (From admission, onward)   Start     Dose/Rate Route Frequency Ordered Stop   03/04/19 1000  vancomycin (VANCOREADY) IVPB 1250 mg/250 mL  Status:  Discontinued     1,250 mg 166.7 mL/hr over 90 Minutes Intravenous Every 24 hours 03/04/19 0917 03/07/19 1117   03/04/19 0930  ceFEPIme (MAXIPIME) 2 g in sodium chloride 0.9 % 100 mL IVPB  Status:  Discontinued     2 g 200 mL/hr over 30 Minutes Intravenous Every 8 hours 03/04/19 0915 03/05/19 0717   03/04/19 0030  vancomycin (VANCOREADY) IVPB 1250 mg/250 mL  Status:  Discontinued     1,250 mg 166.7 mL/hr over 90 Minutes Intravenous  Once 03/04/19 0018 03/04/19 0021   03/04/19 0030  ceFEPIme (MAXIPIME) 2 g in sodium chloride 0.9 % 100 mL IVPB  Status:  Discontinued     2 g 200 mL/hr over 30 Minutes Intravenous  Once 03/04/19 0018 03/04/19 0021       Subjective  The patient is resting comfortably. No new complaints.   Objective   Vitals:  Vitals:   03/11/19 1328 03/11/19 1335  BP: 134/83 124/78  Pulse: 81 82  Resp: 20 19  Temp: 98.3 F (36.8 C) 98.7 F (37.1 C)  SpO2:  100%   Exam:  Constitutional:  . The patient is awake, alert, and oriented x 3. No acute distress. Respiratory:  . No increased work of breathing. . No wheezes, rales, or rhonchi . No tactile fremitus Cardiovascular:  . Regular rate and rhythm . No murmurs, ectopy, or gallups. . No lateral PMI. No thrills. Abdomen:  . Abdomen is soft, non-tender, non-distended . No hernias, masses, or organomegaly . Normoactive bowel sounds.  Musculoskeletal:  . No cyanosis, clubbing, or edema Skin:  . No rashes, lesions, ulcers . palpation of skin: no induration or nodules Neurologic:  . CN 2-12 intact . Sensation all 4 extremities intact Psychiatric:  . Mental status o Mood, affect appropriate o Orientation to person, place, time  . judgment and insight appear intact  I have personally reviewed the following:   Today's Data  . Vitals, BMP, Glucoses  Scheduled Meds: . sodium chloride   Intravenous Once  . sodium chloride   Intravenous Once  . Chlorhexidine Gluconate Cloth  6 each Topical Daily  . feeding supplement (GLUCERNA SHAKE)  237 mL Oral BID BM  . folic acid  2 mg Oral Daily  . insulin aspart  0-15 Units Subcutaneous TID WC  . insulin aspart  0-5 Units Subcutaneous QHS  . senna-docusate  2 tablet Oral BID  . sodium chloride flush  3 mL Intravenous Once   Continuous Infusions: . 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 Marin Olp, [id number] ordered and pending. High suspicion for TTP Right IJ temporary HD catheter placed on 03/05/2019 Started plasma exchange on 03/05/2019 Planned plasma exchange 12/30, 12/31, 03/08/19. Received Solu-Medrol 80 mg daily, last dose was 03/07/2019.  WBC and platelets stable Drop in hemoglobin 7.6; will transfuse less than 7. Appreciate hematology's assistance.  Pancytopenia, improving with transfusions and plasma exchange. Transfuse to maintain hemoglobin greater than 7 Transfuse to maintain platelet count greater than 20,000. Avoid NSAIDs in the setting of thrombocytopenia Continue daily CBCs  Recurrent fever, noted post plasma exchange Blood cultures negative to date and urine culture negative final.  Steroid-induced hyperglycemia  No longer on Solu-Medrol and CBGs are improved On sensitive insulin sliding scale  Resolved transient elevated blood pressure suspect in the setting of volume expansion from multiple blood products transfusion and steroid use Not on antihypertensive medications at home and no prior history of essential hypertension IV labetalol 5 mg every 6 hours as needed for SBP greater than 170. Blood pressure is currently at goal. Continue to monitor vital signs.  NASH Appears stable Avoid hepatotoxins as possible Follows outpatient  I have seen and examined this patient myself. I have spent 32 minutes in her evaluation and care.  Code Status: Full code  Family Communication: None available.  Disposition Plan: Patient is currently not appropriate for discharge at this time due to ongoing treatment for presumed TTP.  We will plan to discharge once hematology signs off.  Vicki Illingworth, DO [facility] Direct contact: see [email or web address]  7PM-7AM contact night coverage as above 03/11/2019, 2:29 PM  LOS: 5 days

## 2019-03-12 LAB — THERAPEUTIC PLASMA EXCHANGE (BLOOD BANK)
Plasma Exchange: 2522
Plasma volume needed: 2522
Unit division: 0
Unit division: 0
Unit division: 0
Unit division: 0
Unit division: 0
Unit division: 0
Unit division: 0
Unit division: 0

## 2019-03-12 LAB — COMPREHENSIVE METABOLIC PANEL
ALT: 38 U/L (ref 0–44)
AST: 60 U/L — ABNORMAL HIGH (ref 15–41)
Albumin: 3 g/dL — ABNORMAL LOW (ref 3.5–5.0)
Alkaline Phosphatase: 66 U/L (ref 38–126)
Anion gap: 9 (ref 5–15)
BUN: 8 mg/dL (ref 6–20)
CO2: 23 mmol/L (ref 22–32)
Calcium: 8.8 mg/dL — ABNORMAL LOW (ref 8.9–10.3)
Chloride: 106 mmol/L (ref 98–111)
Creatinine, Ser: 0.8 mg/dL (ref 0.44–1.00)
GFR calc Af Amer: >60 mL/min (ref >60)
GFR calc non Af Amer: >60 mL/min (ref >60)
Glucose, Bld: 181 mg/dL — ABNORMAL HIGH (ref 70–99)
Potassium: 4.3 mmol/L (ref 3.5–5.1)
Sodium: 138 mmol/L (ref 135–145)
Total Bilirubin: 0.9 mg/dL (ref 0.3–1.2)
Total Protein: 5.2 g/dL — ABNORMAL LOW (ref 6.5–8.1)

## 2019-03-12 LAB — GLUCOSE, CAPILLARY
Glucose-Capillary: 164 mg/dL — ABNORMAL HIGH (ref 70–99)
Glucose-Capillary: 174 mg/dL — ABNORMAL HIGH (ref 70–99)
Glucose-Capillary: 178 mg/dL — ABNORMAL HIGH (ref 70–99)

## 2019-03-12 LAB — LACTATE DEHYDROGENASE: LDH: 288 U/L — ABNORMAL HIGH (ref 98–192)

## 2019-03-12 MED ORDER — CALCIUM CARBONATE ANTACID 500 MG PO CHEW: 2.0000 | CHEWABLE_TABLET | ORAL | Status: DC

## 2019-03-12 MED ORDER — CALCIUM CARBONATE ANTACID 500 MG PO CHEW
2.0000 | CHEWABLE_TABLET | ORAL | Status: AC
  Administered 2019-03-12: 400 mg via ORAL
  Filled 2019-03-12: qty 2

## 2019-03-12 MED ORDER — DIPHENHYDRAMINE HCL 25 MG PO CAPS: 25.0000 mg | ORAL_CAPSULE | Freq: Four times a day (QID) | ORAL | Status: DC | PRN

## 2019-03-12 MED ORDER — ANTICOAGULANT SODIUM CITRATE 4% (200MG/5ML) IV SOLN
5.0000 mL | Freq: Once | Status: DC
  Filled 2019-03-12: qty 5

## 2019-03-12 MED ORDER — ANTICOAGULANT SODIUM CITRATE 4% (200MG/5ML) IV SOLN
5.0000 mL | Status: AC
  Administered 2019-03-12: 5 mL
  Filled 2019-03-12: qty 5

## 2019-03-12 MED ORDER — SODIUM CHLORIDE 0.9 % IV SOLN
2.0000 g | Freq: Once | INTRAVENOUS | Status: DC
  Administered 2019-03-12: 2 g via INTRAVENOUS
  Filled 2019-03-12: qty 20

## 2019-03-12 MED ORDER — CALCIUM CARBONATE ANTACID 500 MG PO CHEW
CHEWABLE_TABLET | ORAL | Status: AC
  Filled 2019-03-12: qty 2

## 2019-03-12 MED ORDER — ACETAMINOPHEN 325 MG PO TABS: 650.0000 mg | ORAL_TABLET | ORAL | Status: DC | PRN

## 2019-03-12 MED ORDER — ACD FORMULA A 0.73-2.45-2.2 GM/100ML VI SOLN
500.0000 mL | Status: DC
  Filled 2019-03-12: qty 500

## 2019-03-12 MED ORDER — ANTICOAGULANT SODIUM CITRATE 4% (200MG/5ML) IV SOLN: 5.0000 mL | Freq: Once | Status: DC

## 2019-03-12 MED ORDER — SODIUM CHLORIDE 0.9 % IV SOLN
2.0000 g | INTRAVENOUS | Status: DC
  Filled 2019-03-12: qty 20

## 2019-03-12 MED ORDER — SODIUM CHLORIDE 0.9 % IV SOLN: 2.0000 g | Freq: Once | INTRAVENOUS | Status: DC

## 2019-03-12 MED ORDER — ACD FORMULA A 0.73-2.45-2.2 GM/100ML VI SOLN: 500.0000 mL | Status: DC

## 2019-03-12 MED ORDER — DIPHENHYDRAMINE HCL 25 MG PO CAPS: 50.0000 mg | ORAL_CAPSULE | Freq: Every day | ORAL | Status: DC | PRN

## 2019-03-12 NOTE — Progress Notes (Signed)
Nutrition Follow-up  DOCUMENTATION CODES:   Not applicable  INTERVENTION:   -Continue Glucerna Shake po BID, each supplement provides 220 kcal and 10 grams of protein -Continue MVI with minerals daily  NUTRITION DIAGNOSIS:   Increased nutrient needs related to acute illness as evidenced by estimated needs.  Ongoing  GOAL:   Patient will meet greater than or equal to 90% of their needs  Progressing   MONITOR:   PO intake, Supplement acceptance, Labs, Weight trends, Skin, I & O's  REASON FOR ASSESSMENT:   Malnutrition Screening Tool    ASSESSMENT:   Vicki [name] is a 59 y.o. female with medical history significant of cirrhosis / fatty liver, DM2, HTN.  12/29- s/p temporary HD cath placement, plasma exchange initiated 12/31- steroids d/c  Reviewed I/O's: -595 ml x 24 hours and -5.3 L since admission  UOP: 1.4 L x 24 hours  Pt with good appetite. Noted meal completion 50-85%. Pt is consuming Glucerna supplements per MAR.   Per hematology notes, pt is tolerating plasma exchange well. Pt may transition to plasma exchange every other day depending on lab values.   Medications reviewed and include calcium carbonate and senna.   Labs reviewed: CBGS: 164-174 (inpatient orders for glycemic control are 0-15 units insulin aspart TID with meals and 0-5 units insulin aspart q HS).   Diet Order:   Diet Order            Diet Carb Modified Fluid consistency: Thin; Room service appropriate? Yes  Diet effective now              EDUCATION NEEDS:   No education needs have been identified at this time  Skin:  Skin Assessment: Reviewed RN Assessment  Last BM:  03/09/18  Height:   Ht Readings from Last 1 Encounters:  03/04/19 5' 2"  (1.575 m)    Weight:   Wt Readings from Last 1 Encounters:  03/12/19 67.4 kg    Ideal Body Weight:  50 kg  BMI:  Body mass index is 27.18 kg/m.  Estimated Nutritional Needs:   Kcal:  1800-2000  Protein:  90-105  grams  Fluid:  1.8- 2 L    Vicki [name] A. Jimmye Norman, RD, LDN, [place] Registered Dietitian II Certified Diabetes Care and Education Specialist Pager: [phone] After hours Pager: [phone]

## 2019-03-12 NOTE — Plan of Care (Signed)
  Problem: Activity: Goal: Risk for activity intolerance will decrease Outcome: Progressing   Problem: Pain Managment: Goal: General experience of comfort will improve Outcome: Progressing   Problem: Safety: Goal: Ability to remain free from injury will improve Outcome: Progressing   

## 2019-03-12 NOTE — Progress Notes (Signed)
PROGRESS NOTE  Vicki [name] QIO:[id number] DOB: [birth year] DOA: 03/03/2019 PCP: Lesleigh Noe, MD  Brief History   Vicki [name] a 59 y.o.femalewith medical history significant ofNASH, thrombocytopenia, recurrent skin boils who presented to the ED due to generalized body aches, fever at home and spontaneous bruising. Has followed with Dr. Irene Limbo hematology in the past year due to worsening pancytopenia.    ED Course:Tm 101.6. HGB 6.1, platelets <5, WBC today of 3.5, LDH 1049 and bili is 3.7,AST 125 ALT 45.  Retic count increased.  Seen by hematology Dr. 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  Temporary HD catheter placed by IR and plasma exchange started on 03/05/2019.  Pt is resting comfortably today. No new complaints. She did not require plasma exchange yesterday. Hematology has recommended that perhaps the patient could go for plasma exchange every other day. Pt developed a pruritic rash with plasma exchange yesterday. It resolved with benadryl. Oral benadryl will be given on call to plasma exchange from now on.  Consultants  . Medical oncology . Interventional radiology  Procedures  . Temporary HD catheter placement . Plasma exchange . Transfusion of 4 units PRBC's and 3 pheresis packs of platelets since admission.  Antibiotics   Anti-infectives (From admission, onward)   Start     Dose/Rate Route Frequency Ordered Stop   03/04/19 1000  vancomycin (VANCOREADY) IVPB 1250 mg/250 mL  Status:  Discontinued     1,250 mg 166.7 mL/hr over 90 Minutes Intravenous Every 24 hours 03/04/19 0917 03/07/19 1117   03/04/19 0930  ceFEPIme (MAXIPIME) 2 g in sodium chloride 0.9 % 100 mL IVPB  Status:  Discontinued     2 g 200 mL/hr over 30 Minutes Intravenous Every 8 hours 03/04/19 0915 03/05/19 0717   03/04/19 0030  vancomycin (VANCOREADY) IVPB 1250 mg/250 mL  Status:  Discontinued     1,250 mg 166.7 mL/hr over 90 Minutes Intravenous  Once 03/04/19 0018 03/04/19 0021   03/04/19 0030  ceFEPIme  (MAXIPIME) 2 g in sodium chloride 0.9 % 100 mL IVPB  Status:  Discontinued     2 g 200 mL/hr over 30 Minutes Intravenous  Once 03/04/19 0018 03/04/19 0021      Subjective  The patient is resting comfortably. No new complaints.   Objective   Vitals:  Vitals:   03/12/19 0448 03/12/19 1146  BP: 123/68 126/70  Pulse: 81 88  Resp: 18 17  Temp: (!) 97.2 F (36.2 C) 98.2 F (36.8 C)  SpO2: 100% 100%   Exam:  Constitutional:  . The patient is awake, alert, and oriented x 3. No acute distress. Respiratory:  . No increased work of breathing. . No wheezes, rales, or rhonchi . No tactile fremitus Cardiovascular:  . Regular rate and rhythm . No murmurs, ectopy, or gallups. . No lateral PMI. No thrills. Abdomen:  . Abdomen is soft, non-tender, non-distended . No hernias, masses, or organomegaly . Normoactive bowel sounds.  Musculoskeletal:  . No cyanosis, clubbing, or edema Skin:  . No rashes, lesions, ulcers . palpation of skin: no induration or nodules Neurologic:  . CN 2-12 intact . Sensation all 4 extremities intact Psychiatric:  . Mental status o Mood, affect appropriate o Orientation to person, place, time  . judgment and insight appear intact  I have personally reviewed the following:   Today's Data  . Vitals, BMP, Glucoses  Scheduled Meds: . sodium chloride   Intravenous Once  . calcium carbonate  2 tablet Oral Q3H  . Chlorhexidine Gluconate Cloth  6 each  Topical Daily  . feeding supplement (GLUCERNA SHAKE)  237 mL Oral BID BM  . folic acid  2 mg Oral Daily  . insulin aspart  0-15 Units Subcutaneous TID WC  . insulin aspart  0-5 Units Subcutaneous QHS  . senna-docusate  2 tablet Oral BID  . sodium chloride flush  3 mL Intravenous Once   Continuous Infusions: . sodium chloride 250 mL (03/05/19 0058)  . anticoagulant sodium citrate    . calcium gluconate IVPB    . 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 She is being followed by hematologist Dr. Marin Olp, [id number] ordered and pending. High suspicion for TTP, and platelets are improving with plasma exchange, although her cirrhosis also is likely playing a role. She had a right IJ temporary HD catheter placed on 03/05/2019 and started plasma exchange on 03/05/2019. She has also received Solu-Medrol 80 mg daily, last dose was 03/07/2019. Platelets have been stable at 75 and 77 in the last 2 days. She has had a drop in hemoglogin and has received 3 ujnits of PRBC's and 3 pheresis packs of platelets since admission.  Appreciate hematology's assistance.  Pancytopenia, improving with transfusions and plasma exchange. Transfuse to maintain hemoglobin greater than 7. Stable at 9.5. Platelets were 77K yesterday.  Transfuse to maintain platelet count greater than 20,000. Avoid NSAIDs in the setting of thrombocytopenia. Continue daily CBCs.  Recurrent fever following plasma exchange. Blood cultures negative to date and urine culture negative final.  Steroid-induced hyperglycemia. Steroids have been discontinued. FSBS has been followed with [facility].   Resolved transient elevated blood pressure suspect in the setting of volume expansion from multiple blood products transfusion and steroid use. Steroids now stopped. The patient is not on antihypertensive medications at home and no prior history of essential hypertension. IV labetalol 5 mg every 6 hours as needed for SBP greater than 170. Blood pressure is currently at goal. Continue to monitor vital signs.  NASH: Stable. Possibly in part responsible for low platelets. Avoid hepatotoxins as possible. Monitor. Follows with  hepatology as outpatient.  I have seen and examined this patient myself. I have spent 34 minutes in her evaluation and care.  DVT Prophylaxis: SCD's Code Status: Full code Family Communication: None available. Disposition Plan: Patient is currently not appropriate for discharge at this time due to ongoing treatment for presumed TTP.  We will plan to discharge once hematology signs off.  Anesha Hackert, DO [facility] Direct contact: see [email or web address]  7PM-7AM contact night coverage as above 03/12/2019, 3:50 PM  LOS: 5 days

## 2019-03-12 NOTE — Plan of Care (Signed)
  Problem: Clinical Measurements: Goal: Diagnostic test results will improve Outcome: Progressing   Problem: Safety: Goal: Ability to remain free from injury will improve Outcome: Progressing

## 2019-03-12 NOTE — Progress Notes (Signed)
Vicki [name] had a little bit of a reaction with the plasma exchange yesterday.  She received some Benadryl which helped.  There is no blood counts back yet today.  However, yesterday, her platelet count was 77,000.  Everything is holding relatively steady.  We will go ahead and do a plasma exchange today.  I will then see about doing a plasma exchange every other day.  We will see if her blood counts hold steady.  Again, with her having cirrhosis, I am unsure how high her platelet count really will get.  She is eating well.  She has had no bleeding.  She has had no nausea or vomiting.  There has been no rashes.  She has had no fever.  Her vital signs all look stable.  She is afebrile.  Her blood pressure is 123/68.  There really is no change in her physical examination.  Again, we will see about doing the plasma exchange today.  I will then go every other day.  We might be able to do this as an outpatient.  She had one of the IVs removed peripherally yesterday.  This made her feel better.  I know that the staff up on 3 E. and on the dialysis unit are really doing a great job with her.  I appreciate all their compassion and there professional care.  Lattie Haw, MD  Lamentations 3:25

## 2019-03-13 LAB — THERAPEUTIC PLASMA EXCHANGE (BLOOD BANK)
Plasma Exchange: 2522
Plasma volume needed: 2522
Unit division: 0
Unit division: 0
Unit division: 0
Unit division: 0
Unit division: 0
Unit division: 0
Unit division: 0
Unit division: 0
Unit division: 0

## 2019-03-13 LAB — CBC WITH DIFFERENTIAL/PLATELET
Abs Immature Granulocytes: 0.02 10*3/uL (ref 0.00–0.07)
Abs Immature Granulocytes: 0.01 10*3/uL (ref 0.00–0.07)
Basophils Absolute: 0 10*3/uL (ref 0.0–0.1)
Basophils Absolute: 0 10*3/uL (ref 0.0–0.1)
Basophils Relative: 0 %
Basophils Relative: 0 %
Eosinophils Absolute: 0.1 10*3/uL (ref 0.0–0.5)
Eosinophils Absolute: 0.1 10*3/uL (ref 0.0–0.5)
Eosinophils Relative: 3 %
Eosinophils Relative: 4 %
HCT: 26.7 % — ABNORMAL LOW (ref 36.0–46.0)
HCT: 30.4 % — ABNORMAL LOW (ref 36.0–46.0)
Hemoglobin: 8.9 g/dL — ABNORMAL LOW (ref 12.0–15.0)
Hemoglobin: 10.2 g/dL — ABNORMAL LOW (ref 12.0–15.0)
Immature Granulocytes: 1 %
Immature Granulocytes: 1 %
Lymphocytes Relative: 35 %
Lymphocytes Relative: 27 %
Lymphs Abs: 0.8 10*3/uL (ref 0.7–4.0)
Lymphs Abs: 0.6 10*3/uL — ABNORMAL LOW (ref 0.7–4.0)
MCH: 34 pg (ref 26.0–34.0)
MCH: 33.8 pg (ref 26.0–34.0)
MCHC: 33.3 g/dL (ref 30.0–36.0)
MCHC: 33.6 g/dL (ref 30.0–36.0)
MCV: 101.9 fL — ABNORMAL HIGH (ref 80.0–100.0)
MCV: 100.7 fL — ABNORMAL HIGH (ref 80.0–100.0)
Monocytes Absolute: 0.3 10*3/uL (ref 0.1–1.0)
Monocytes Absolute: 0.2 10*3/uL (ref 0.1–1.0)
Monocytes Relative: 12 %
Monocytes Relative: 10 %
Neutro Abs: 1.1 10*3/uL — ABNORMAL LOW (ref 1.7–7.7)
Neutro Abs: 1.3 10*3/uL — ABNORMAL LOW (ref 1.7–7.7)
Neutrophils Relative %: 49 %
Neutrophils Relative %: 58 %
Platelets: 80 10*3/uL — ABNORMAL LOW (ref 150–400)
Platelets: 87 10*3/uL — ABNORMAL LOW (ref 150–400)
RBC: 2.62 MIL/uL — ABNORMAL LOW (ref 3.87–5.11)
RBC: 3.02 MIL/uL — ABNORMAL LOW (ref 3.87–5.11)
RDW: 16.3 % — ABNORMAL HIGH (ref 11.5–15.5)
RDW: 16.3 % — ABNORMAL HIGH (ref 11.5–15.5)
WBC: 2.3 10*3/uL — ABNORMAL LOW (ref 4.0–10.5)
WBC: 2.2 10*3/uL — ABNORMAL LOW (ref 4.0–10.5)
nRBC: 0 % (ref 0.0–0.2)
nRBC: 0 % (ref 0.0–0.2)

## 2019-03-13 LAB — GLUCOSE, CAPILLARY
Glucose-Capillary: 127 mg/dL — ABNORMAL HIGH (ref 70–99)
Glucose-Capillary: 227 mg/dL — ABNORMAL HIGH (ref 70–99)
Glucose-Capillary: 113 mg/dL — ABNORMAL HIGH (ref 70–99)

## 2019-03-13 LAB — POCT I-STAT, CHEM 8
BUN: 10 mg/dL (ref 6–20)
BUN: 8 mg/dL (ref 6–20)
Calcium, Ion: 1.2 mmol/L (ref 1.15–1.40)
Calcium, Ion: 1.21 mmol/L (ref 1.15–1.40)
Chloride: 100 mmol/L (ref 98–111)
Chloride: 102 mmol/L (ref 98–111)
Creatinine, Ser: 0.7 mg/dL (ref 0.44–1.00)
Creatinine, Ser: 0.7 mg/dL (ref 0.44–1.00)
Glucose, Bld: 165 mg/dL — ABNORMAL HIGH (ref 70–99)
Glucose, Bld: 135 mg/dL — ABNORMAL HIGH (ref 70–99)
HCT: 28 % — ABNORMAL LOW (ref 36.0–46.0)
HCT: 27 % — ABNORMAL LOW (ref 36.0–46.0)
Hemoglobin: 9.5 g/dL — ABNORMAL LOW (ref 12.0–15.0)
Hemoglobin: 9.2 g/dL — ABNORMAL LOW (ref 12.0–15.0)
Potassium: 3.6 mmol/L (ref 3.5–5.1)
Potassium: 3.5 mmol/L (ref 3.5–5.1)
Sodium: 140 mmol/L (ref 135–145)
Sodium: 139 mmol/L (ref 135–145)
TCO2: 26 mmol/L (ref 22–32)
TCO2: 26 mmol/L (ref 22–32)

## 2019-03-13 LAB — BASIC METABOLIC PANEL
Anion gap: 5 (ref 5–15)
BUN: 8 mg/dL (ref 6–20)
CO2: 29 mmol/L (ref 22–32)
Calcium: 9.1 mg/dL (ref 8.9–10.3)
Chloride: 104 mmol/L (ref 98–111)
Creatinine, Ser: 0.79 mg/dL (ref 0.44–1.00)
GFR calc Af Amer: >60 mL/min (ref >60)
GFR calc non Af Amer: >60 mL/min (ref >60)
Glucose, Bld: 183 mg/dL — ABNORMAL HIGH (ref 70–99)
Potassium: 3.8 mmol/L (ref 3.5–5.1)
Sodium: 138 mmol/L (ref 135–145)

## 2019-03-13 LAB — COMPREHENSIVE METABOLIC PANEL
ALT: 26 U/L (ref 0–44)
AST: 33 U/L (ref 15–41)
Albumin: 3 g/dL — ABNORMAL LOW (ref 3.5–5.0)
Alkaline Phosphatase: 51 U/L (ref 38–126)
Anion gap: 9 (ref 5–15)
BUN: 7 mg/dL (ref 6–20)
CO2: 27 mmol/L (ref 22–32)
Calcium: 8.9 mg/dL (ref 8.9–10.3)
Chloride: 103 mmol/L (ref 98–111)
Creatinine, Ser: 0.6 mg/dL (ref 0.44–1.00)
GFR calc Af Amer: >60 mL/min (ref >60)
GFR calc non Af Amer: >60 mL/min (ref >60)
Glucose, Bld: 129 mg/dL — ABNORMAL HIGH (ref 70–99)
Potassium: 3.9 mmol/L (ref 3.5–5.1)
Sodium: 139 mmol/L (ref 135–145)
Total Bilirubin: 1 mg/dL (ref 0.3–1.2)
Total Protein: 5.3 g/dL — ABNORMAL LOW (ref 6.5–8.1)

## 2019-03-13 LAB — LACTATE DEHYDROGENASE: LDH: 154 U/L (ref 98–192)

## 2019-03-13 LAB — RETICULOCYTES
Immature Retic Fract: 7.7 % (ref 2.3–15.9)
RBC.: 2.62 MIL/uL — ABNORMAL LOW (ref 3.87–5.11)
Retic Count, Absolute: 97.2 10*3/uL (ref 19.0–186.0)
Retic Ct Pct: 3.7 % — ABNORMAL HIGH (ref 0.4–3.1)

## 2019-03-13 MED ORDER — ANTICOAGULANT SODIUM CITRATE 4% (200MG/5ML) IV SOLN
5.0000 mL | Status: AC
  Administered 2019-03-13: 5 mL
  Filled 2019-03-13: qty 5

## 2019-03-13 MED ORDER — SODIUM CHLORIDE 0.9 % IV SOLN
2.0000 g | INTRAVENOUS | Status: AC
  Administered 2019-03-13: 2 g via INTRAVENOUS
  Filled 2019-03-13: qty 20

## 2019-03-13 MED ORDER — CALCIUM CARBONATE ANTACID 500 MG PO CHEW
2.0000 | CHEWABLE_TABLET | ORAL | Status: AC
  Administered 2019-03-13: 400 mg via ORAL

## 2019-03-13 MED ORDER — ACETAMINOPHEN 325 MG PO TABS: 650.0000 mg | ORAL_TABLET | ORAL | Status: DC | PRN

## 2019-03-13 MED ORDER — CALCIUM CARBONATE ANTACID 500 MG PO CHEW
CHEWABLE_TABLET | ORAL | Status: AC
  Filled 2019-03-13: qty 2

## 2019-03-13 MED ORDER — ACD FORMULA A 0.73-2.45-2.2 GM/100ML VI SOLN
500.0000 mL | Status: DC
  Administered 2019-03-13: 500 mL via INTRAVENOUS
  Filled 2019-03-13: qty 500

## 2019-03-13 MED ORDER — DIPHENHYDRAMINE HCL 25 MG PO CAPS: 25.0000 mg | ORAL_CAPSULE | Freq: Four times a day (QID) | ORAL | Status: DC | PRN

## 2019-03-13 NOTE — Progress Notes (Signed)
PROGRESS NOTE  Vicki [name] CVE:[id number] DOB: 12-15-1960 DOA: 03/03/2019 PCP: Lesleigh Noe, MD  Brief History   Vicki [name] a 59 y.o.femalewith medical history significant ofNASH, thrombocytopenia, recurrent skin boils who presented to the ED due to generalized body aches, fever at home and spontaneous bruising. Has followed with Dr. Irene Limbo hematology in the past year due to worsening pancytopenia.    ED Course:Tm 101.6. HGB 6.1, platelets <5, WBC today of 3.5, LDH 1049 and bili is 3.7,AST 125 ALT 45.  Retic count increased.  Seen by hematology Dr. 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  Temporary HD catheter placed by IR and plasma exchange started on 03/05/2019.  Pt is resting comfortably today. No new complaints. She did not require plasma exchange yesterday. Hematology has recommended that perhaps the patient could go for plasma exchange every other day. Pt developed a pruritic rash with plasma exchange yesterday. It resolved with benadryl. Oral benadryl will be given on call to plasma exchange from now on.  Per Hematology the patient may be able to be discharged to home if she can be converted to every other day plasma exchanges. This will depend on the stability of her CBC over the next days.  Consultants  . Medical oncology . Interventional radiology  Procedures  . Temporary HD catheter placement . Plasma exchange . Transfusion of 4 units PRBC's and 3 pheresis packs of platelets since admission.  Antibiotics   Anti-infectives (From admission, onward)   Start     Dose/Rate Route Frequency Ordered Stop   03/04/19 1000  vancomycin (VANCOREADY) IVPB 1250 mg/250 mL  Status:  Discontinued     1,250 mg 166.7 mL/hr over 90 Minutes Intravenous Every 24 hours 03/04/19 0917 03/07/19 1117   03/04/19 0930  ceFEPIme (MAXIPIME) 2 g in sodium chloride 0.9 % 100 mL IVPB  Status:  Discontinued     2 g 200 mL/hr over 30 Minutes Intravenous Every 8 hours 03/04/19 0915 03/05/19 0717   03/04/19  0030  vancomycin (VANCOREADY) IVPB 1250 mg/250 mL  Status:  Discontinued     1,250 mg 166.7 mL/hr over 90 Minutes Intravenous  Once 03/04/19 0018 03/04/19 0021   03/04/19 0030  ceFEPIme (MAXIPIME) 2 g in sodium chloride 0.9 % 100 mL IVPB  Status:  Discontinued     2 g 200 mL/hr over 30 Minutes Intravenous  Once 03/04/19 0018 03/04/19 0021      Subjective  The patient is resting comfortably. No new complaints.   Objective   Vitals:  Vitals:   03/13/19 1419 03/13/19 1437  BP: 123/68 119/66  Pulse: 89 92  Resp: 20 20  Temp: 98.9 F (37.2 C) 98.9 F (37.2 C)  SpO2:  98%   Exam:  Constitutional:  . The patient is awake, alert, and oriented x 3. No acute distress. Respiratory:  . No increased work of breathing. . No wheezes, rales, or rhonchi . No tactile fremitus Cardiovascular:  . Regular rate and rhythm . No murmurs, ectopy, or gallups. . No lateral PMI. No thrills. Abdomen:  . Abdomen is soft, non-tender, non-distended . No hernias, masses, or organomegaly . Normoactive bowel sounds.  Musculoskeletal:  . No cyanosis, clubbing, or edema Skin:  . No rashes, lesions, ulcers . palpation of skin: no induration or nodules Neurologic:  . CN 2-12 intact . Sensation all 4 extremities intact Psychiatric:  . Mental status o Mood, affect appropriate o Orientation to person, place, time  . judgment and insight appear intact  I have personally reviewed the following:  Today's Data  . Vitals, BMP, Glucoses  Scheduled Meds: . sodium chloride   Intravenous Once  . calcium carbonate  2 tablet Oral Q3H  . Chlorhexidine Gluconate Cloth  6 each Topical Daily  . feeding supplement (GLUCERNA SHAKE)  237 mL Oral BID BM  . folic acid  2 mg Oral Daily  . insulin aspart  0-15 Units Subcutaneous TID WC  . insulin aspart  0-5 Units Subcutaneous QHS  . senna-docusate  2 tablet Oral BID  . sodium chloride flush  3 mL Intravenous Once   Continuous Infusions: . sodium chloride  250 mL (03/05/19 0058)  . 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 She is being followed by hematologist Dr. Marin Olp, ADAMTS-13 ordered and pending. High suspicion for TTP, and platelets are improving with plasma exchange, although her cirrhosis also is likely playing a role. She had a right IJ temporary HD catheter placed on 03/05/2019 and started plasma exchange on 03/05/2019. She has also received Solu-Medrol 80 mg daily, last dose was 03/07/2019. Platelets have been stable at 75 and 77 in the last 2 days. She has had a drop in hemoglogin and has received 3 ujnits of PRBC's and 3 pheresis packs of platelets since admission. Appreciate hematology's assistance. Per Hematology the patient may be able to be discharged to home if she can be converted to every other day plasma exchanges. This will depend on the stability of her CBC over the next days.  Pancytopenia, improving with transfusions and plasma exchange. Transfuse to maintain hemoglobin greater than 7. Stable at 9.5. Platelets were 77K yesterday.  Transfuse to maintain platelet count greater than 20,000. Avoid NSAIDs in the setting of thrombocytopenia. Continue daily CBCs.  Recurrent fever following plasma exchange. Blood cultures negative to date and urine culture negative final.  Steroid-induced hyperglycemia. Steroids have been discontinued. FSBS has been followed with SSI.   Resolved transient elevated blood pressure suspect in the setting of volume expansion from multiple blood products transfusion and steroid use. Steroids now stopped. The patient is not on antihypertensive medications at home and no prior  history of essential hypertension. IV labetalol 5 mg every 6 hours as needed for SBP greater than 170. Blood pressure is currently at goal. Continue to monitor vital signs.  NASH: Stable. Possibly in part responsible for low platelets. Avoid hepatotoxins as possible. Monitor. Follows with hepatology as outpatient.  I have seen and examined this patient myself. I have spent 30 minutes in her evaluation and care.  DVT Prophylaxis: SCD's Code Status: Full code Family Communication: None available. Disposition Plan: Per hematology the patient may be discharged to home if she can be maintained on QOD plasma exchanges. This will depend on the stability of there CBC. Monitor.  Loana Salvaggio, DO [facility] Direct contact: see [email or web address]  7PM-7AM contact night coverage as above 03/13/2019, 4:39 PM  LOS: 5 days

## 2019-03-13 NOTE — Progress Notes (Signed)
Overall, Ms. Guajardo is doing okay.  I am awaiting the results of her labs today.  Her LDH is down to 194.  Her bilirubin is 0.9.  Her urine is clear.  Hopefully, we can move her plasma exchanges every other day.  If her blood counts hold stable, then maybe we can do her treatments as an outpatient.  She is eating well.  She has had no nausea or vomiting.  There is no diarrhea.  She has had no cough or shortness of breath.  She has had no rashes.  There has been no fever.  On her physical exam, all of her vital signs are stable.  Temperature is 98.7.  Pulse 78.  Blood pressure 108/61.  Her lungs are clear.  Cardiac exam regular rate and rhythm.  Abdomen is soft.  There is no palpable liver or spleen tip.  Extremities shows no clubbing, cyanosis or edema.  Neurological exam is nonfocal.  We will have to see what her CBC shows today.  Hopefully, we can hold on plasma exchange.  If we do go to plasma exchange every other day, then we will try to do this as an outpatient.  Lattie Haw, MD  Darlyn Chamber 29:11

## 2019-03-13 NOTE — Plan of Care (Signed)
  Problem: Clinical Measurements: Goal: Diagnostic test results will improve Outcome: Progressing   Problem: Activity: Goal: Risk for activity intolerance will decrease Outcome: Progressing   Problem: Safety: Goal: Ability to remain free from injury will improve Outcome: Progressing

## 2019-03-14 LAB — COMPREHENSIVE METABOLIC PANEL
ALT: 24 U/L (ref 0–44)
AST: 34 U/L (ref 15–41)
Albumin: 3.2 g/dL — ABNORMAL LOW (ref 3.5–5.0)
Alkaline Phosphatase: 60 U/L (ref 38–126)
Anion gap: 4 — ABNORMAL LOW (ref 5–15)
BUN: 6 mg/dL (ref 6–20)
CO2: 27 mmol/L (ref 22–32)
Calcium: 8.7 mg/dL — ABNORMAL LOW (ref 8.9–10.3)
Chloride: 107 mmol/L (ref 98–111)
Creatinine, Ser: 0.7 mg/dL (ref 0.44–1.00)
GFR calc Af Amer: >60 mL/min (ref >60)
GFR calc non Af Amer: >60 mL/min (ref >60)
Glucose, Bld: 135 mg/dL — ABNORMAL HIGH (ref 70–99)
Potassium: 4 mmol/L (ref 3.5–5.1)
Sodium: 138 mmol/L (ref 135–145)
Total Bilirubin: 0.8 mg/dL (ref 0.3–1.2)
Total Protein: 5.4 g/dL — ABNORMAL LOW (ref 6.5–8.1)

## 2019-03-14 LAB — CBC WITH DIFFERENTIAL/PLATELET
Abs Immature Granulocytes: 0 10*3/uL (ref 0.00–0.07)
Basophils Absolute: 0 10*3/uL (ref 0.0–0.1)
Basophils Relative: 0 %
Eosinophils Absolute: 0 10*3/uL (ref 0.0–0.5)
Eosinophils Relative: 0 %
HCT: 28.8 % — ABNORMAL LOW (ref 36.0–46.0)
Hemoglobin: 9.4 g/dL — ABNORMAL LOW (ref 12.0–15.0)
Lymphocytes Relative: 28 %
Lymphs Abs: 0.6 10*3/uL — ABNORMAL LOW (ref 0.7–4.0)
MCH: 33.6 pg (ref 26.0–34.0)
MCHC: 32.6 g/dL (ref 30.0–36.0)
MCV: 102.9 fL — ABNORMAL HIGH (ref 80.0–100.0)
Monocytes Absolute: 0.1 10*3/uL (ref 0.1–1.0)
Monocytes Relative: 5 %
Neutro Abs: 1.4 10*3/uL — ABNORMAL LOW (ref 1.7–7.7)
Neutrophils Relative %: 67 %
Platelets: 82 10*3/uL — ABNORMAL LOW (ref 150–400)
RBC: 2.8 MIL/uL — ABNORMAL LOW (ref 3.87–5.11)
RDW: 16.1 % — ABNORMAL HIGH (ref 11.5–15.5)
WBC: 2.1 10*3/uL — ABNORMAL LOW (ref 4.0–10.5)
nRBC: 0 % (ref 0.0–0.2)

## 2019-03-14 LAB — RETICULOCYTES
Immature Retic Fract: 11.4 % (ref 2.3–15.9)
RBC.: 2.77 MIL/uL — ABNORMAL LOW (ref 3.87–5.11)
Retic Count, Absolute: 69.3 10*3/uL (ref 19.0–186.0)
Retic Ct Pct: 2.5 % (ref 0.4–3.1)

## 2019-03-14 LAB — THERAPEUTIC PLASMA EXCHANGE (BLOOD BANK)
Plasma Exchange: 2522
Plasma volume needed: 2522
Unit division: 0
Unit division: 0
Unit division: 0
Unit division: 0
Unit division: 0
Unit division: 0
Unit division: 0
Unit division: 0

## 2019-03-14 LAB — BASIC METABOLIC PANEL
Anion gap: 7 (ref 5–15)
BUN: 7 mg/dL (ref 6–20)
CO2: 24 mmol/L (ref 22–32)
Calcium: 8.9 mg/dL (ref 8.9–10.3)
Chloride: 104 mmol/L (ref 98–111)
Creatinine, Ser: 0.67 mg/dL (ref 0.44–1.00)
GFR calc Af Amer: >60 mL/min (ref >60)
GFR calc non Af Amer: >60 mL/min (ref >60)
Glucose, Bld: 179 mg/dL — ABNORMAL HIGH (ref 70–99)
Potassium: 3.8 mmol/L (ref 3.5–5.1)
Sodium: 135 mmol/L (ref 135–145)

## 2019-03-14 LAB — GLUCOSE, CAPILLARY
Glucose-Capillary: 141 mg/dL — ABNORMAL HIGH (ref 70–99)
Glucose-Capillary: 137 mg/dL — ABNORMAL HIGH (ref 70–99)
Glucose-Capillary: 164 mg/dL — ABNORMAL HIGH (ref 70–99)
Glucose-Capillary: 136 mg/dL — ABNORMAL HIGH (ref 70–99)

## 2019-03-14 LAB — LACTATE DEHYDROGENASE: LDH: 172 U/L (ref 98–192)

## 2019-03-14 MED ORDER — ACD FORMULA A 0.73-2.45-2.2 GM/100ML VI SOLN
500.0000 mL | Status: DC
  Filled 2019-03-14 (×2): qty 500

## 2019-03-14 MED ORDER — CEFAZOLIN SODIUM-DEXTROSE 2-4 GM/100ML-% IV SOLN
2.0000 g | INTRAVENOUS | Status: AC
  Administered 2019-03-15: 09:00:00 2 g via INTRAVENOUS
  Filled 2019-03-14: qty 100

## 2019-03-14 MED ORDER — DIPHENHYDRAMINE HCL 25 MG PO CAPS: 25.0000 mg | ORAL_CAPSULE | Freq: Four times a day (QID) | ORAL | Status: DC | PRN

## 2019-03-14 MED ORDER — ACETAMINOPHEN 325 MG PO TABS
650.0000 mg | ORAL_TABLET | ORAL | Status: DC | PRN
  Filled 2019-03-14: qty 2

## 2019-03-14 MED ORDER — HYDROMORPHONE HCL 1 MG/ML IJ SOLN: 1.0000 mg | Freq: Once | INTRAMUSCULAR | Status: DC

## 2019-03-14 MED ORDER — CALCIUM CARBONATE ANTACID 500 MG PO CHEW
2.0000 | CHEWABLE_TABLET | ORAL | Status: AC
  Filled 2019-03-14 (×2): qty 2

## 2019-03-14 MED ORDER — ANTICOAGULANT SODIUM CITRATE 4% (200MG/5ML) IV SOLN
5.0000 mL | Freq: Once | Status: DC
  Filled 2019-03-14: qty 5

## 2019-03-14 MED ORDER — SODIUM CHLORIDE 0.9 % IV SOLN
2.0000 g | Freq: Once | INTRAVENOUS | Status: DC
  Filled 2019-03-14: qty 20

## 2019-03-14 NOTE — Plan of Care (Signed)

## 2019-03-14 NOTE — Progress Notes (Signed)
Ms. Bruhl did well with her plasma exchange yesterday.  Her blood counts today are holding steady.  Her platelet count is 82,000.  Her LDH is 172.  Her reticulocyte count is 2.5%.  Her bilirubin is 0.8.  I think we can get her on every other day plasma exchange as an outpatient.  I would do her plasma exchange tomorrow as an inpatient.  After that, I would have her do plasma exchange Monday-Wednesday-Friday.  I am not sure if her platelets will ever get to a "normal" level because of the cirrhosis.  I think the LDH in the reticulocyte count and the bilirubin are all very good indicators that this TTP is under very good control.  She is eating well.  She has had no nausea or vomiting.  She has had no problems with bowels or bladder.  She has had no hematuria.  There is been no fever.  She has had no cough.  She is out of bed.  Her vital signs are temperature 99.  Pulse 84.  Blood pressure 113/69.  Her lungs are clear bilaterally.  Cardiac exam regular rate and rhythm with no murmurs, rubs or bruits.  Abdomen is soft.  She has good bowel sounds.  There is no fluid wave.  There is no palpable liver or spleen tip.  Back exam shows no tenderness over the spine.  Neurological exam shows no focal deficits.  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  She is on plasma exchange.  We changed her plasma exchange to every other day.  Again, we can try to do plasma exchange as an outpatient.  I would do her plasma exchange tomorrow in the hospital.  Hopefully, she can then be discharged.  I am not sure how we do or arrange for outpatient plasma exchange.  I would talk to the dialysis unit.  I guess I would have to talk to blood bank.  She does have the dialysis catheter in place.  We need to DC the catheter in her right arm.  I do not think we really need that catheter at this point.  She has gotten fantastic care from everybody on 3 E.  The dialysis unit has also been a great job with her plasma exchanges.  Lattie Haw,  MD  [address] 15:58

## 2019-03-14 NOTE — Progress Notes (Signed)
PROGRESS NOTE  [name] MHD:[id number] DOB: [birth year] DOA: 03/03/2019 PCP: Lesleigh Noe, MD  Brief History   Vicki [name] a 59 y.o.femalewith medical history significant ofNASH, thrombocytopenia, recurrent skin boils who presented to the ED due to generalized body aches, fever at home and spontaneous bruising. Has followed with Dr. Irene Limbo hematology in the past year due to worsening pancytopenia.    ED Course:Tm 101.6. HGB 6.1, platelets <5, WBC today of 3.5, LDH 1049 and bili is 3.7,AST 125 ALT 45.  Retic count increased.  Seen by hematology Dr. 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  Temporary HD catheter placed by IR and plasma exchange started on 03/05/2019.  Pt is resting comfortably today. No new complaints. She did not require plasma exchange yesterday. Hematology has recommended that perhaps the patient could go for plasma exchange every other day. Pt developed a pruritic rash with plasma exchange yesterday. It resolved with benadryl. Oral benadryl will be given on call to plasma exchange from now on.  Per Hematology the patient may be able to be discharged to home if she can be converted to every other day plasma exchanges. Dr. Marin Olp has asked that a tunneled catheter be placed to allow for ongoing plasma exchanges as outpatient in anticipation of the patient's discharge tomorrow.  Consultants  . Medical oncology . Interventional radiology  Procedures  . Temporary HD catheter placement . Plasma exchange . Transfusion of 4 units PRBC's and 3 pheresis packs of platelets since admission.  Antibiotics   Anti-infectives (From admission, onward)   Start     Dose/Rate Route Frequency Ordered Stop   03/15/19 1500  ceFAZolin (ANCEF) IVPB 2g/100 mL premix     2 g 200 mL/hr over 30 Minutes Intravenous To Radiology 03/14/19 1421 03/16/19 1500   03/04/19 1000  vancomycin (VANCOREADY) IVPB 1250 mg/250 mL  Status:  Discontinued     1,250 mg 166.7 mL/hr over 90 Minutes Intravenous Every  24 hours 03/04/19 0917 03/07/19 1117   03/04/19 0930  ceFEPIme (MAXIPIME) 2 g in sodium chloride 0.9 % 100 mL IVPB  Status:  Discontinued     2 g 200 mL/hr over 30 Minutes Intravenous Every 8 hours 03/04/19 0915 03/05/19 0717   03/04/19 0030  vancomycin (VANCOREADY) IVPB 1250 mg/250 mL  Status:  Discontinued     1,250 mg 166.7 mL/hr over 90 Minutes Intravenous  Once 03/04/19 0018 03/04/19 0021   03/04/19 0030  ceFEPIme (MAXIPIME) 2 g in sodium chloride 0.9 % 100 mL IVPB  Status:  Discontinued     2 g 200 mL/hr over 30 Minutes Intravenous  Once 03/04/19 0018 03/04/19 0021      Subjective  The patient is resting comfortably. No new complaints.   Objective   Vitals:  Vitals:   03/13/19 2128 03/14/19 0450  BP: 121/80 113/69  Pulse: 89 84  Resp: 20 20  Temp: 99.5 F (37.5 C) 99 F (37.2 C)  SpO2: 100% 100%   Exam:  Constitutional:  . The patient is awake, alert, and oriented x 3. No acute distress. Respiratory:  . No increased work of breathing. . No wheezes, rales, or rhonchi . No tactile fremitus Cardiovascular:  . Regular rate and rhythm . No murmurs, ectopy, or gallups. . No lateral PMI. No thrills. Abdomen:  . Abdomen is soft, non-tender, non-distended . No hernias, masses, or organomegaly . Normoactive bowel sounds.  Musculoskeletal:  . No cyanosis, clubbing, or edema Skin:  . No rashes, lesions, ulcers . palpation of skin: no induration or nodules  Neurologic:  . CN 2-12 intact . Sensation all 4 extremities intact Psychiatric:  . Mental status o Mood, affect appropriate o Orientation to person, place, time  . judgment and insight appear intact  I have personally reviewed the following:   Today's Data  . Vitals, BMP, Glucoses  Scheduled Meds: . sodium chloride   Intravenous Once  . calcium carbonate  2 tablet Oral Q3H  . Chlorhexidine Gluconate Cloth  6 each Topical Daily  . feeding supplement (GLUCERNA SHAKE)  237 mL Oral BID BM  . folic acid  2  mg Oral Daily  . insulin aspart  0-15 Units Subcutaneous TID WC  . insulin aspart  0-5 Units Subcutaneous QHS  . senna-docusate  2 tablet Oral BID  . sodium chloride flush  3 mL Intravenous Once   Continuous Infusions: . sodium chloride 250 mL (03/05/19 0058)  . anticoagulant sodium citrate    . calcium gluconate IVPB    . [START ON 03/15/2019]  ceFAZolin (ANCEF) IV    . citrate dextrose    . 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 She is being followed by hematologist Dr. Marin Olp, ADAMTS-13 ordered and pending. High suspicion for TTP, and platelets are improving with plasma exchange, although her cirrhosis also is likely playing a role. She had a right IJ temporary HD catheter placed on 03/05/2019 and started plasma exchange on 03/05/2019. She has also received Solu-Medrol 80 mg daily, last dose was 03/07/2019. Platelets have been stable at 75 and 77 in the last 2 days. She has had a drop in hemoglogin and has received 3 ujnits of PRBC's and 3 pheresis packs of platelets since admission. Appreciate hematology's assistance. Per Hematology the patient may be able to be discharged to home if she can be converted to every other day plasma exchanges. Dr. Marin Olp has asked that a tunneled catheter be placed to allow for ongoing plasma exchanges as outpatient in anticipation of the patient's discharge tomorrow.  Pancytopenia, improving with transfusions and plasma exchange. Transfuse to maintain hemoglobin greater than 7. Stable at 9.4. Platelets were 82K today.  Transfuse to maintain platelet count greater than 20,000. Avoid NSAIDs in the setting of thrombocytopenia. Continue  daily CBCs.  Recurrent fever following plasma exchange. Blood cultures negative to date and urine culture negative final.  Steroid-induced hyperglycemia. Steroids have been discontinued. FSBS has been followed with SSI.   Resolved transient elevated blood pressure suspect in the setting of volume expansion from multiple blood products transfusion and steroid use. Steroids now stopped. The patient is not on antihypertensive medications at home and no prior history of essential hypertension. IV labetalol 5 mg every 6 hours as needed for SBP greater than 170. Blood pressure is currently at goal. Continue to monitor vital signs.  NASH: Stable. Possibly in part responsible for low platelets. Avoid hepatotoxins as possible. Monitor. Follows with hepatology as outpatient.  I have seen and examined this patient myself. I have spent 32 minutes in her evaluation and care.  DVT Prophylaxis: SCD's Code Status: Full code Family Communication: None available. Disposition Plan: Anticipate discharge tomorrow. Per hematology the patient may be discharged to home if she can be maintained  on QOD plasma exchanges.   Tyree Fluharty, DO [facility] Direct contact: see [email or web address]  7PM-7AM contact night coverage as above 03/14/2019, 4:29 PM  LOS: 5 days

## 2019-03-14 NOTE — Progress Notes (Signed)
Referring Physician(s): Ennever,P  Supervising Physician: Markus Daft  Patient Status:  [facility] - In-pt  Chief Complaint: TTP (thrombotic thrombocytopenic purpura)   Subjective: Pt known to IR service from BM bx on 11/29/18 and temp plasmapheresis cath placement on 03/05/19; she has a hx of TTP and undergoing plasma exchanges; she is scheduled for discharge home tomorrow and request received for conversion of existing cath to tunneled plasmapheresis catheter. She currently denies fever, HA,CP,dyspnea, abd/back pain,N/V or bleeding. She does have occ cough.   Past Medical History:  Diagnosis Date  . Allergy   . Arthritis   . Boil   . 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 Diabetes ([place])    type 2  . Elevated liver enzymes   . Fatty liver   . Hyperlipemia   . Hypertension   . 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 PVC's (premature ventricular contractions)    Past Surgical History:  Procedure Laterality Date  . COLONOSCOPY  2014  . ECTOPIC PREGNANCY SURGERY  1990  . IR FLUORO GUIDE CV LINE RIGHT  03/05/2019  . IR US GUIDE VASC ACCESS RIGHT  03/05/2019  . TONSILLECTOMY  1980  . UPPER GI ENDOSCOPY  08/2017   [facility]      Allergies: Metformin and related, Sulfa antibiotics, and Sulfasalazine  Medications: Prior to Admission medications   Medication Sig Start Date End Date Taking? Authorizing Provider  acetaminophen (TYLENOL) 500 MG tablet Take 500 mg by mouth as needed for pain.   Yes [provider]  alum & mag hydroxide-simeth (MAALOX PLUS) 400-400-40 MG/5ML suspension Take 10 mLs by mouth every 6 (six) hours as needed for indigestion.    Yes [provider]  cholecalciferol (VITAMIN D3) 25 MCG (1000 UT) tablet Take 1,000 Units by mouth daily.   Yes [provider]  dextromethorphan-guaiFENesin (MUCINEX DM) 30-600 MG 12hr tablet Take 2 tablets by mouth 2 (two) times daily as needed for cough.    Yes [provider]  fluorometholone (FML) 0.1 % ophthalmic suspension Place 1 drop into the left eye 4 (four) times daily.    Yes [provider]  Multiple Vitamins-Minerals (MULTI FOR HER 50+) TABS Take 1 tablet by mouth daily.   Yes [provider]  mupirocin ointment (BACTROBAN) 2 % Apply 1 application topically 2 (two) times daily. 01/29/19  Yes Yu, Amy V, PA-C  NON FORMULARY Take 120 mLs by mouth daily. Beet juice   Yes [provider]  Pedialyte (PEDIALYTE) SOLN Take 240 mLs by mouth every 8 (eight) hours as needed (dehydration).   Yes [provider]  traZODone (DESYREL) 50 MG tablet Take 50 mg by mouth at bedtime as needed for sleep.   Yes [provider]  Turmeric POWD by Does not apply route. Pt takes 1-2 times weekly.   Yes [provider]  glucose blood test strip One touch ultra 11/11/15   [provider]  topiramate (TOPAMAX) 25 MG tablet Take one tablet at night for one week, then take 2 tablets at night for one week, then take 3 tablets at night. Patient not taking: Reported on 03/04/2019 04/09/18   Kathrynn Ducking, MD     Vital Signs: BP 113/69 (BP Location: Left Arm)   Pulse 84   Temp 99 F (37.2 C) (Oral)   Resp 20   Ht 5' 2"  (1.575 m)   Wt 149 lb 4.8 oz (67.7 kg)   LMP 04/30/2012  SpO2 100%   BMI 27.31 kg/m   Physical Exam awake/alert; chest- CTA bilat; heart- RRR; abd- soft,+BS,NT; trace -1+ pretibial edema bilat; temp CVC rt neck region clean and dry  Imaging: No results found.  Labs:  CBC: Recent Labs    03/11/19 0719 03/13/19 0428 03/13/19 0847 03/13/19 1228 03/14/19 0516  WBC 2.2* 2.3* 2.2*  --  2.1*  HGB 9.8* 8.9* 10.2* 9.2* 9.4*  HCT 29.7* 26.7* 30.4* 27.0* 28.8*  PLT 77* 80* 87*  --  82*    COAGS: Recent Labs    11/29/18 1005 02/13/19 1418 03/03/19 2300  INR 1.2 1.2 1.3*    BMP: Recent Labs    03/12/19 0433 03/13/19 0428 03/13/19 1056 03/13/19 1228  03/14/19 0516  NA 138 139 138 139 138  K 4.3 3.9 3.8 3.5 4.0  CL 106 103 104 102 107  CO2 23 27 29   --  27  GLUCOSE 181* 129* 183* 135* 135*  BUN 8 7 8 8 6   CALCIUM 8.8* 8.9 9.1  --  8.7*  CREATININE 0.80 0.60 0.79 0.70 0.70  GFRNONAA >60 >60 >60  --  >60  GFRAA >60 >60 >60  --  >60    LIVER FUNCTION TESTS: Recent Labs    03/11/19 0719 03/12/19 0433 03/13/19 0428 03/14/19 0516  BILITOT 1.2 0.9 1.0 0.8  AST 80* 60* 33 34  ALT 63* 38 26 24  ALKPHOS 75 66 51 60  PROT 6.0* 5.2* 5.3* 5.4*  ALBUMIN 3.3* 3.0* 3.0* 3.2*    Assessment and Plan: Pt with hx of TTP and undergoing plasma exchanges; s/p temp CVC placement on 03/05/19;  she is scheduled for discharge home tomorrow and request received for conversion of existing cath to tunneled plasmapheresis catheter. Details/risks of procedure, incl but not limited to, internal bleeding, infection, injury to adjacent structures d/w with her understanding and consent. Procedure scheduled for 1/8.    Electronically Signed: D. Rowe Robert, PA-C 03/14/2019, 2:06 PM   I spent a total of 20 minutes at the the patient's bedside AND on the patient's hospital floor or unit, greater than 50% of which was counseling/coordinating care for conversion of temporary to tunneled central venous catheter    Patient ID: Vicki [name], female   DOB: 1960/04/12, 59 y.o.   MRN: [id number]

## 2019-03-14 NOTE — Progress Notes (Signed)
CSW consulted for disability information, CSW met with patient and provided [employer] packet with information on applying for disability, patient accepting of information.   [place], [place]

## 2019-03-15 ENCOUNTER — Inpatient Hospital Stay (HOSPITAL_COMMUNITY): Payer: Federal, State, Local not specified - PPO

## 2019-03-15 HISTORY — PX: IR FLUORO GUIDE CV LINE RIGHT: IMG2283

## 2019-03-15 HISTORY — PX: IR US GUIDE VASC ACCESS RIGHT: IMG2390

## 2019-03-15 LAB — POCT I-STAT, CHEM 8
BUN: 5 mg/dL — ABNORMAL LOW (ref 6–20)
Calcium, Ion: 1.28 mmol/L (ref 1.15–1.40)
Chloride: 103 mmol/L (ref 98–111)
Creatinine, Ser: 0.5 mg/dL (ref 0.44–1.00)
Glucose, Bld: 180 mg/dL — ABNORMAL HIGH (ref 70–99)
HCT: 29 % — ABNORMAL LOW (ref 36.0–46.0)
Hemoglobin: 9.9 g/dL — ABNORMAL LOW (ref 12.0–15.0)
Potassium: 3.6 mmol/L (ref 3.5–5.1)
Sodium: 138 mmol/L (ref 135–145)
TCO2: 23 mmol/L (ref 22–32)

## 2019-03-15 LAB — CBC WITH DIFFERENTIAL/PLATELET
Abs Immature Granulocytes: 0 10*3/uL (ref 0.00–0.07)
Basophils Absolute: 0 10*3/uL (ref 0.0–0.1)
Basophils Relative: 1 %
Eosinophils Absolute: 0.1 10*3/uL (ref 0.0–0.5)
Eosinophils Relative: 5 %
HCT: 28.4 % — ABNORMAL LOW (ref 36.0–46.0)
Hemoglobin: 9.2 g/dL — ABNORMAL LOW (ref 12.0–15.0)
Immature Granulocytes: 0 %
Lymphocytes Relative: 27 %
Lymphs Abs: 0.5 10*3/uL — ABNORMAL LOW (ref 0.7–4.0)
MCH: 33.3 pg (ref 26.0–34.0)
MCHC: 32.4 g/dL (ref 30.0–36.0)
MCV: 102.9 fL — ABNORMAL HIGH (ref 80.0–100.0)
Monocytes Absolute: 0.3 10*3/uL (ref 0.1–1.0)
Monocytes Relative: 15 %
Neutro Abs: 0.9 10*3/uL — ABNORMAL LOW (ref 1.7–7.7)
Neutrophils Relative %: 52 %
Platelets: 94 10*3/uL — ABNORMAL LOW (ref 150–400)
RBC: 2.76 MIL/uL — ABNORMAL LOW (ref 3.87–5.11)
RDW: 15.9 % — ABNORMAL HIGH (ref 11.5–15.5)
WBC: 1.8 10*3/uL — ABNORMAL LOW (ref 4.0–10.5)
nRBC: 0 % (ref 0.0–0.2)

## 2019-03-15 LAB — COMPREHENSIVE METABOLIC PANEL
ALT: 27 U/L (ref 0–44)
AST: 38 U/L (ref 15–41)
Albumin: 3.3 g/dL — ABNORMAL LOW (ref 3.5–5.0)
Alkaline Phosphatase: 74 U/L (ref 38–126)
Anion gap: 7 (ref 5–15)
BUN: 7 mg/dL (ref 6–20)
CO2: 24 mmol/L (ref 22–32)
Calcium: 8.9 mg/dL (ref 8.9–10.3)
Chloride: 106 mmol/L (ref 98–111)
Creatinine, Ser: 0.6 mg/dL (ref 0.44–1.00)
GFR calc Af Amer: >60 mL/min (ref >60)
GFR calc non Af Amer: >60 mL/min (ref >60)
Glucose, Bld: 137 mg/dL — ABNORMAL HIGH (ref 70–99)
Potassium: 3.9 mmol/L (ref 3.5–5.1)
Sodium: 137 mmol/L (ref 135–145)
Total Bilirubin: 0.8 mg/dL (ref 0.3–1.2)
Total Protein: 6 g/dL — ABNORMAL LOW (ref 6.5–8.1)

## 2019-03-15 LAB — LACTATE DEHYDROGENASE: LDH: 170 U/L (ref 98–192)

## 2019-03-15 LAB — GLUCOSE, CAPILLARY
Glucose-Capillary: 142 mg/dL — ABNORMAL HIGH (ref 70–99)
Glucose-Capillary: 127 mg/dL — ABNORMAL HIGH (ref 70–99)
Glucose-Capillary: 164 mg/dL — ABNORMAL HIGH (ref 70–99)

## 2019-03-15 LAB — RETICULOCYTES
Immature Retic Fract: 13.4 % (ref 2.3–15.9)
RBC.: 2.71 MIL/uL — ABNORMAL LOW (ref 3.87–5.11)
Retic Count, Absolute: 56.4 10*3/uL (ref 19.0–186.0)
Retic Ct Pct: 2.1 % (ref 0.4–3.1)

## 2019-03-15 LAB — PROTIME-INR
INR: 1.1 (ref 0.8–1.2)
Prothrombin Time: 14.5 seconds (ref 11.4–15.2)

## 2019-03-15 MED ORDER — HEPARIN SODIUM (PORCINE) 1000 UNIT/ML IJ SOLN
INTRAMUSCULAR | Status: AC
  Administered 2019-03-15: 3200 [IU]
  Filled 2019-03-15: qty 1

## 2019-03-15 MED ORDER — MIDAZOLAM HCL 2 MG/2ML IJ SOLN
INTRAMUSCULAR | Status: AC
  Filled 2019-03-15: qty 2

## 2019-03-15 MED ORDER — ACETAMINOPHEN 325 MG PO TABS: 650.0000 mg | ORAL_TABLET | ORAL | Status: DC | PRN

## 2019-03-15 MED ORDER — SENNOSIDES-DOCUSATE SODIUM 8.6-50 MG PO TABS: 2.0000 | ORAL_TABLET | Freq: Two times a day (BID) | ORAL | 0 refills | Status: DC

## 2019-03-15 MED ORDER — SODIUM CHLORIDE 0.9 % IV SOLN
2.0000 g | Freq: Once | INTRAVENOUS | Status: AC
  Administered 2019-03-15: 2 g via INTRAVENOUS
  Filled 2019-03-15 (×2): qty 20

## 2019-03-15 MED ORDER — FOLIC ACID 1 MG PO TABS: 2.0000 mg | ORAL_TABLET | Freq: Every day | ORAL | 0 refills | Status: DC

## 2019-03-15 MED ORDER — DIPHENHYDRAMINE HCL 25 MG PO CAPS: 25.0000 mg | ORAL_CAPSULE | Freq: Four times a day (QID) | ORAL | Status: DC | PRN

## 2019-03-15 MED ORDER — CALCIUM CARBONATE ANTACID 500 MG PO CHEW: 2.0000 | CHEWABLE_TABLET | ORAL | 0 refills | Status: DC

## 2019-03-15 MED ORDER — FENTANYL CITRATE (PF) 100 MCG/2ML IJ SOLN
INTRAMUSCULAR | Status: AC
  Filled 2019-03-15: qty 2

## 2019-03-15 MED ORDER — ACD FORMULA A 0.73-2.45-2.2 GM/100ML VI SOLN
500.0000 mL | Status: DC
  Administered 2019-03-15: 500 mL via INTRAVENOUS
  Filled 2019-03-15: qty 500

## 2019-03-15 MED ORDER — CEFAZOLIN SODIUM-DEXTROSE 2-4 GM/100ML-% IV SOLN
INTRAVENOUS | Status: AC
  Filled 2019-03-15: qty 100

## 2019-03-15 MED ORDER — GELATIN ABSORBABLE 12-7 MM EX MISC
CUTANEOUS | Status: AC | PRN
  Administered 2019-03-15: 1 via TOPICAL

## 2019-03-15 MED ORDER — CALCIUM CARBONATE ANTACID 500 MG PO CHEW
CHEWABLE_TABLET | ORAL | Status: AC
  Filled 2019-03-15: qty 2

## 2019-03-15 MED ORDER — GELATIN ABSORBABLE 12-7 MM EX MISC
CUTANEOUS | Status: AC
  Filled 2019-03-15: qty 1

## 2019-03-15 MED ORDER — CHLORHEXIDINE GLUCONATE 4 % EX LIQD
CUTANEOUS | Status: AC
  Administered 2019-03-15: 1
  Filled 2019-03-15: qty 15

## 2019-03-15 MED ORDER — ACD FORMULA A 0.73-2.45-2.2 GM/100ML VI SOLN
Status: AC
  Filled 2019-03-15: qty 500

## 2019-03-15 MED ORDER — CALCIUM CARBONATE ANTACID 500 MG PO CHEW
2.0000 | CHEWABLE_TABLET | ORAL | Status: AC
  Administered 2019-03-15: 400 mg via ORAL

## 2019-03-15 MED ORDER — ANTICOAGULANT SODIUM CITRATE 4% (200MG/5ML) IV SOLN
5.0000 mL | Freq: Once | Status: AC
  Administered 2019-03-15: 5 mL
  Filled 2019-03-15 (×2): qty 5

## 2019-03-15 MED ORDER — MIDAZOLAM HCL 2 MG/2ML IJ SOLN
INTRAMUSCULAR | Status: AC | PRN
  Administered 2019-03-15: 1 mg via INTRAVENOUS

## 2019-03-15 MED ORDER — TRAMADOL HCL 50 MG PO TABS: 50.0000 mg | ORAL_TABLET | Freq: Four times a day (QID) | ORAL | 0 refills | Status: DC | PRN

## 2019-03-15 MED ORDER — FENTANYL CITRATE (PF) 100 MCG/2ML IJ SOLN
INTRAMUSCULAR | Status: AC | PRN
  Administered 2019-03-15: 50 ug via INTRAVENOUS

## 2019-03-15 MED ORDER — DIPHENHYDRAMINE HCL 50 MG PO CAPS: 50.0000 mg | ORAL_CAPSULE | Freq: Every day | ORAL | 0 refills | Status: DC | PRN

## 2019-03-15 MED ORDER — LIDOCAINE HCL 1 % IJ SOLN
INTRAMUSCULAR | Status: AC
  Filled 2019-03-15: qty 20

## 2019-03-15 NOTE — Progress Notes (Signed)
Vicki [name] to be D/C'd Home per MD order.  Discussed with the patient and all questions fully answered.  VSS, Skin clean, dry and intact without evidence of skin break down, no evidence of skin tears noted.  An After Visit Summary was printed and given to the patient. Patient received prescription.  D/c education completed with patient/family including follow up instructions, medication list, d/c activities limitations if indicated, with other d/c instructions as indicated by MD - patient able to verbalize understanding, all questions fully answered.   Patient instructed to return to ED, call 911, or call MD for any changes in condition.   Patient escorted via [place], and D/C home via private auto.  Howard Pouch 03/15/2019 8:14 PM

## 2019-03-15 NOTE — Procedures (Signed)
Interventional Radiology Procedure:   Indications: TTP and needs tunneled pheresis catheter  Procedure: Placement of tunneled pheresis catheter  Findings: Right jugular catheter, tip in upper right atrium  Complications: None     EBL: less than 20 ml  Plan: Catheter is ready to use.    Bricyn Labrada R. Anselm Pancoast, MD  Pager: [phone]

## 2019-03-15 NOTE — Progress Notes (Signed)
Received report from IR at approx 1024. Patient returned from IR appprox 1040. RN to bedside. VS assessed. Cath site assesssed covered with gauze both lumens capped. Right neck band aid from prior cath removal   4/10 pain, aching, at site. Declined pain meds at this time  Paged Sway[place] at 1059. Patient back from IR from cath placement. ok to eat? add diet orders?   Report to Ronny Bacon at 1304 in HD. Patient left unit for HD at 1317  Patient returned to unit from HD at 1643. HD cath site gauze clean dry and intact. No drainage.  Reviewed AVS with patient at [phone]

## 2019-03-15 NOTE — Progress Notes (Signed)
Overall, Vicki [name] is doing well.  She was off her plasma exchange yesterday.  She needs to have a more permanent dialysis catheter placed before she can go home.  She will have this done this morning prior to her plasma exchange.  I think she can go home after her plasma exchange.  She must be on folic acid when she goes home.  She needs 2 mg a day.  I spoke to the dialysis unit.  She will have outpatient plasma exchange on Monday-Wednesday-Friday.  No labs are back yet today.  Yesterday, her platelet count was 82,000.  Her hemoglobin was 9.4.  Her LDH was 172.  She is eating well.  She is having no problems with nausea or vomiting.  She is having no cough.  There is no fever.  There is no rashes.  Her temperature 99.6.  Pulse 82.  Blood pressure 116/62.  Her head neck exam shows no oral lesions.  There is no adenopathy in the neck.  Lungs are clear bilaterally.  Cardiac exam regular rate and rhythm.  Abdomen is soft.  Bowel sounds are present.  There is no fluid wave.  Extremities shows no clubbing, cyanosis or edema.  Neurological exam shows no focal neurological deficits.  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  She has chronic low blood counts.  However, she was admitted with exacerbation of her blood counts.  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  Her ADAMTS-13 was less than 2%.  I did look at her blood smear recently.  I do not see really schistocytes.  Hopefully, she will be able to go home after her plasma exchange today.  She will then have outpatient plasma exchange next week and the following week on Monday-Wednesday-Friday.  I will see her back in the office on 19 January.  I know that she is gone fantastic care from everybody up on 3 E.  Lattie Haw, MD  1 [place] 15:58

## 2019-03-16 LAB — THERAPEUTIC PLASMA EXCHANGE (BLOOD BANK)
Plasma Exchange: 2522
Plasma volume needed: 2522
Unit division: 0
Unit division: 0
Unit division: 0
Unit division: 0
Unit division: 0
Unit division: 0
Unit division: 0
Unit division: 0
Unit division: 0

## 2019-03-16 LAB — PATHOLOGIST SMEAR REVIEW

## 2019-03-16 NOTE — Discharge Summary (Signed)
Physician Discharge Summary  Vicki [name] DQQ:[id number] DOB: 09/04/1960 DOA: 03/03/2019  PCP: Lesleigh Noe, MD  Admit date: 03/03/2019 Discharge date: 03/16/2019  Recommendations for Outpatient Follow-up:  1. Outpatient plasma exchanges as per Dr. Marin Olp with hematology. 2. Follow up with PCP in 7-10 days. 3. CBC to be drawn on 03/18/2019 and reported to Dr. Marin Olp and PCP.  Follow-up Information    Lesleigh Noe, MD Follow up.   Specialty: Family Medicine Why: Please call the Human Resources department of your employment to obtain FMLA paperwork. Your PCP office will assist in completing these.  Contact information: [place] [address] [place], [place] Follow up.   Specialty: [place] Why: Please follow up with [place] offices to apply to disability insurance.  Contact information: [place] [address] [phone]        Lesleigh Noe, MD.   Specialty: [facility] Medicine Contact information: [place] [address] [phone]          Discharge Diagnoses: Principal diagnosis is #1 1. TTP 2. Pancytopenia 3. Recurrent fever 4. Steroid induced hyperglycemia 5. Transient elevated blood pressure due to volume expansion 6. NASH  Discharge Condition: Fair  Disposition: Home  Diet recommendation: Heart healthy  Filed Weights   03/13/19 0643 03/14/19 0450 03/15/19 0430  Weight: 67.1 kg 67.7 kg 67.2 kg    History of present illness:   Vicki [name] is a 59 y.o. female with medical history significant of cirrhosis / fatty liver, DM2, HTN. Patient has been being followed for past couple months for a progressively worsening pancytopenia by Dr. Irene Limbo.  Looks like they thought it was just due to hepatosplenomegally and cirrhosis based on office notes from earlier this month. On 11/24 patient had ED visit with fever attributed  to abscesses of groin area. Today presents to the ED with 2 day history of myalgias, cough, fatigue, fever. No SOB, roommate did have multiple exposures to [place].  In the ED the patient was found to have Tm 101.6.  HGB 6.1 down from 10.5 earlier this month, platelets are <5 down from 34 earlier this month.  She had been stable with HGB 11.3 and 39 back in Sept. WBC today of 3.5, slightly up from 1.5 earlier this month. Hemoccult neg and no h/o bleeding. LDH 1049 and bili is 3.7.  AST 125 ALT 45. Retic count increased. Ferritin 1134 up from 300 earlier this month.  Hospital Course:  Vicki [name] a 59 y.o.femalewith medical history significant ofNASH,thrombocytopenia, recurrent skin boils who presented to the ED due to generalized body aches, fever at home and spontaneous bruising. Has followed with Dr. Irene Limbo hematology in the past year due to worsening pancytopenia.   ED Course:Tm 101.6. HGB 6.1, platelets <5, WBC today of 3.5, LDH 1049 and bili is 3.7,AST 125 ALT 45. Retic count increased. Seen by hematology Dr. 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 TemporaryHD catheter placed by IR andplasma exchangestartedon 03/05/2019.  Pt is resting comfortably today. No new complaints. She did not require plasma exchange yesterday. Hematology has recommended that perhaps the patient could go for plasma exchange every other day. Pt developed a pruritic rash with plasma exchange yesterday. It resolved with benadryl. Oral benadryl will be given on call to plasma exchange from now on.  Per Hematology the patient was discharged to home as she can be converted to every other day  plasma exchanges. Dr. Marin Olp has arranged to have a tunneled catheter by interventional radiology. She will complete her last inpatient plasma exchange today. Then she will be discharged to home.  Today's assessment: S: The patient is resting comfortably. She found the tunnelled catheter placement to be uncomfortable. O: Vitals:   Vitals:   03/15/19 1546 03/15/19 1654  BP: 139/78 124/66  Pulse: 91 86  Resp: (!) 22 20  Temp: 98.7 F (37.1 C) 99.3 F (37.4 C)  SpO2: 100% 100%   Presumed TTP: Presented with significant pancytopenia,elevated reticulocyte count,elevated LDHand elevated bilirubin. She is being followed by hematologist Dr. Marin Olp, ADAMTS-13 ordered and pending. High suspicion for TTP, and platelets are improving with plasma exchange, although her cirrhosis also is likely playing a role. She had a right IJ temporary HD catheter placedon 03/05/2019 and started plasma exchange on 03/05/2019. She has also received Solu-Medrol 80 mg daily, last dose was 03/07/2019.Platelets have been stable at 75 and 77 in the last 2 days. She has had a drop in hemoglogin and has received 3 ujnits of PRBC's and 3 pheresis packs of platelets since admission. Appreciate hematology's assistance. Per Hematology the patient may be able to be discharged to home if she can be converted to every other day plasma exchanges. Dr. Marin Olp has asked that a tunneled catheter be placed to allow for ongoing plasma exchanges as outpatient in anticipation of the patient's discharge tomorrow.  Pancytopenia, improving with transfusions and plasma exchange. Transfuse to maintain hemoglobin greater than 7. Stable at 9.4. Platelets were 82K today.  Transfuse to maintain platelet count greater than 20,000. Avoid NSAIDs in the setting ofthrombocytopenia. Continue daily CBCs.  Recurrent fever following plasma exchange. Blood cultures negative to date and urine culture negative final.  Steroid-induced hyperglycemia. Steroids have been discontinued. FSBS has been followed with SSI.   Resolved transient elevated blood pressure suspect in the setting of volume expansion from multiple blood products transfusion and steroid use. Steroids now stopped. The patient is not on antihypertensive medications at homeand no prior history of essential hypertension.  IV labetalol 5 mg every 6 hours as needed for SBP greater than 170. Blood pressure is currently at goal. Continue to monitor vital signs.  NASH: Stable. Possibly in part responsible for low platelets. Avoid hepatotoxins as possible. Monitor. Follows with hepatology as outpatient.  I have seen and examined this patient myself. I have spent 32 minutes in her evaluation and care.  DVT Prophylaxis: SCD's Code Status:Full code Family Communication:None available. Disposition Plan:Anticipate discharge tomorrow. Per hematology the patient may be discharged to home if she can be maintained on QOD plasma exchanges.  Discharge Instructions  Discharge Instructions    Activity as tolerated - No restrictions   Complete by: As directed    Call MD for:  temperature >100.4   Complete by: As directed    Diet Carb Modified   Complete by: As directed    Discharge instructions   Complete by: As directed    Outpatient plasma exchanges as per Dr. Marin Olp with hematology. Follow up with PCP in 7-10 days. CBC to be drawn on 03/18/2019 and reported to Dr. Marin Olp and PCP.   Increase activity slowly   Complete by: As directed      Allergies as of 03/15/2019      Reactions   Metformin And Related Itching   Sulfa Antibiotics Itching   Sulfasalazine Itching      Medication List    STOP taking these medications   acetaminophen 500  MG tablet Commonly known as: TYLENOL   mupirocin ointment 2 % Commonly known as: Bactroban   NON FORMULARY   Pedialyte Soln   topiramate 25 MG tablet Commonly known as: TOPAMAX   traZODone 50 MG tablet Commonly known as: DESYREL   Turmeric Powd     TAKE these medications   alum & mag hydroxide-simeth 676-195-09 MG/5ML suspension Commonly known as: MAALOX PLUS Take 10 mLs by mouth every 6 (six) hours as needed for indigestion.   calcium carbonate 500 MG chewable tablet Commonly known as: TUMS - dosed in mg elemental calcium Chew 2 tablets (400 mg of  elemental calcium total) by mouth every 3 (three) hours.   cholecalciferol 25 MCG (1000 UT) tablet Commonly known as: VITAMIN D3 Take 1,000 Units by mouth daily.   dextromethorphan-guaiFENesin 30-600 MG 12hr tablet Commonly known as: MUCINEX DM Take 2 tablets by mouth 2 (two) times daily as needed for cough.   diphenhydrAMINE 50 MG capsule Commonly known as: BENADRYL Take 1 capsule (50 mg total) by mouth daily as needed (on call for plasma exchange).   fluorometholone 0.1 % ophthalmic suspension Commonly known as: FML Place 1 drop into the left eye 4 (four) times daily.   folic acid 1 MG tablet Commonly known as: FOLVITE Take 2 tablets (2 mg total) by mouth daily.   glucose blood test strip One touch ultra   Multi For Her 50+ Tabs Take 1 tablet by mouth daily.   senna-docusate 8.6-50 MG tablet Commonly known as: Senokot-S Take 2 tablets by mouth 2 (two) times daily.   traMADol 50 MG tablet Commonly known as: Ultram Take 1 tablet (50 mg total) by mouth every 6 (six) hours as needed.      Allergies  Allergen Reactions  . Metformin And Related Itching  . Sulfa Antibiotics Itching  . Sulfasalazine Itching    The results of significant diagnostics from this hospitalization (including imaging, microbiology, ancillary and laboratory) are listed below for reference.    Significant Diagnostic Studies: IR Fluoro Guide CV Line Right  Result Date: 03/15/2019 INDICATION: 59 year old with TTP needs a tunneled dialysis catheter for plasmapheresis. EXAM: FLUOROSCOPIC AND ULTRASOUND GUIDED PLACEMENT OF A TUNNELED DIALYSIS CATHETER Physician: Stephan Minister. Anselm Pancoast, MD MEDICATIONS: Ancef 2 g; The antibiotic was administered within an appropriate time interval prior to skin puncture. ANESTHESIA/SEDATION: Versed 1.0 mg IV; Fentanyl 50 mcg IV; Moderate Sedation Time:  29 minutes The patient was continuously monitored during the procedure by the interventional radiology nurse under my direct  supervision. FLUOROSCOPY TIME:  Fluoroscopy Time: 18 seconds, 1 mGy COMPLICATIONS: None immediate. PROCEDURE: The procedure was explained to the patient. The risks and benefits of the procedure were discussed and the patient's questions were addressed. Informed consent was obtained from the patient. The patient was placed supine on the interventional table. The existing non tunneled right jugular catheter was removed with manual compression. Ultrasound confirmed a patent right internal jugular vein. Ultrasound images were obtained for documentation. The right neck and chest was prepped and draped in a sterile fashion. The right neck was anesthetized with 1% lidocaine. Maximal barrier sterile technique was utilized including caps, mask, sterile gowns, sterile gloves, sterile drape, hand hygiene and skin antiseptic. A small incision was made with #11 blade scalpel. A 21 gauge needle directed into the right internal jugular vein vein with ultrasound guidance. A micropuncture dilator set was placed. A 19 cm tip to cuff Palindrome catheter was selected. The skin below the right clavicle was anesthetized and a  small incision was made with an #11 blade scalpel. A subcutaneous tunnel was formed to the vein dermatotomy site. The catheter was brought through the tunnel. The vein dermatotomy site was dilated to accommodate a peel-away sheath. The catheter was placed through the peel-away sheath and directed into the central venous structures. The tip of the catheter was placed in the right atrium with fluoroscopy. Fluoroscopic images were obtained for documentation. Both lumens were found to aspirate and flush well. The proper amount of heparin was flushed in both lumens. The vein dermatotomy site was closed using a single layer of absorbable suture and Dermabond. Gel-Foam was placed in the subcutaneous tract. The catheter was secured to the skin using Prolene suture. IMPRESSION: Successful placement of a right jugular  tunneled dialysis catheter using ultrasound and fluoroscopic guidance. Electronically Signed   By: Markus Daft M.D.   On: 03/15/2019 11:30   IR US Guide Vasc Access Right  Result Date: 03/15/2019 INDICATION: 59 year old with TTP needs a tunneled dialysis catheter for plasmapheresis. EXAM: FLUOROSCOPIC AND ULTRASOUND GUIDED PLACEMENT OF A TUNNELED DIALYSIS CATHETER Physician: Stephan Minister. Anselm Pancoast, MD MEDICATIONS: Ancef 2 g; The antibiotic was administered within an appropriate time interval prior to skin puncture. ANESTHESIA/SEDATION: Versed 1.0 mg IV; Fentanyl 50 mcg IV; Moderate Sedation Time:  29 minutes The patient was continuously monitored during the procedure by the interventional radiology nurse under my direct supervision. FLUOROSCOPY TIME:  Fluoroscopy Time: 18 seconds, 1 mGy COMPLICATIONS: None immediate. PROCEDURE: The procedure was explained to the patient. The risks and benefits of the procedure were discussed and the patient's questions were addressed. Informed consent was obtained from the patient. The patient was placed supine on the interventional table. The existing non tunneled right jugular catheter was removed with manual compression. Ultrasound confirmed a patent right internal jugular vein. Ultrasound images were obtained for documentation. The right neck and chest was prepped and draped in a sterile fashion. The right neck was anesthetized with 1% lidocaine. Maximal barrier sterile technique was utilized including caps, mask, sterile gowns, sterile gloves, sterile drape, hand hygiene and skin antiseptic. A small incision was made with #11 blade scalpel. A 21 gauge needle directed into the right internal jugular vein vein with ultrasound guidance. A micropuncture dilator set was placed. A 19 cm tip to cuff Palindrome catheter was selected. The skin below the right clavicle was anesthetized and a small incision was made with an #11 blade scalpel. A subcutaneous tunnel was formed to the vein  dermatotomy site. The catheter was brought through the tunnel. The vein dermatotomy site was dilated to accommodate a peel-away sheath. The catheter was placed through the peel-away sheath and directed into the central venous structures. The tip of the catheter was placed in the right atrium with fluoroscopy. Fluoroscopic images were obtained for documentation. Both lumens were found to aspirate and flush well. The proper amount of heparin was flushed in both lumens. The vein dermatotomy site was closed using a single layer of absorbable suture and Dermabond. Gel-Foam was placed in the subcutaneous tract. The catheter was secured to the skin using Prolene suture. IMPRESSION: Successful placement of a right jugular tunneled dialysis catheter using ultrasound and fluoroscopic guidance. Electronically Signed   By: Markus Daft M.D.   On: 03/15/2019 11:30   IR US Guide Vasc Access Right  Result Date: 03/05/2019 INDICATION: 59 year old female referred for catheter placement for plasmapheresis EXAM: IMAGE GUIDED PLACEMENT OF TEMPORARY HEMODIALYSIS CATHETER MEDICATIONS: None ANESTHESIA/SEDATION: None FLUOROSCOPY TIME:  Fluoroscopy Time: 0 minutes  6 seconds (1 mGy). COMPLICATIONS: None PROCEDURE: Informed written consent was obtained from the patient after a discussion of the risks, benefits, and alternatives to treatment. Questions regarding the procedure were encouraged and answered. The right neck was prepped with chlorhexidine in a sterile fashion, and a sterile drape was applied covering the operative field. Maximum barrier sterile technique with sterile gowns and gloves were used for the procedure. A timeout was performed prior to the initiation of the procedure. A micropuncture kit was utilized to access the right internal jugular vein under direct, real-time ultrasound guidance after the overlying soft tissues were anesthetized with 1% lidocaine with epinephrine. Ultrasound image documentation was performed. The  microwire was kinked to measure appropriate catheter length. A stiff glidewire was advanced to the level of the IVC. A 16 cm hemodialysis catheter was then placed over the wire. Final catheter positioning was confirmed and documented with a spot radiographic image. The catheter aspirates and flushes normally. The catheter was flushed with appropriate volume heparin dwells. Dressings were applied. The patient tolerated the procedure well without immediate post procedural complication. IMPRESSION: Status post right IJ temporary plasmapheresis catheter placement. Signed, Dulcy Fanny. Dellia Nims, RPVI [facility] Radiology Electronically Signed   By: Corrie Mckusick D.O.   On: 03/05/2019 11:36   DG Chest Portable 1 View  Result Date: 03/03/2019 CLINICAL DATA:  59 year old female with fever. EXAM: PORTABLE CHEST 1 VIEW COMPARISON:  Chest radiograph dated 10/28/2017. FINDINGS: The heart size and mediastinal contours are within normal limits. Both lungs are clear. The visualized skeletal structures are unremarkable. IMPRESSION: No active disease. Electronically Signed   By: Anner Crete M.D.   On: 03/03/2019 22:55   US Abdomen Limited RUQ  Result Date: 03/03/2019 CLINICAL DATA:  Abdominal pain for 1-2 weeks, history of non alcoholic cirrhosis EXAM: ULTRASOUND ABDOMEN LIMITED RIGHT UPPER QUADRANT COMPARISON:  Ultrasound 09/05/2016, CT abdomen pelvis 04/10/2018 FINDINGS: Gallbladder: No gallstones or wall thickening visualized. No sonographic Murphy sign noted by sonographer. Common bile duct: Diameter: 3.1 mm proximally, 2.7 mm distally.  Nondilated. Liver: Diffusely increased hepatic echogenicity with loss of definition of the portal triads and diminished posterior through transmission compatible with hepatic steatosis. Mildly nodular hepatic surface contour. No focal lesions. Portal vein is patent on color Doppler imaging with normal direction of blood flow towards  the liver. Other: None. IMPRESSION: Diffusely increased hepatic echogenicity compatible with intrinsic liver disease most commonly hepatic steatosis. The nodular surface contour of the liver is also suggestive of some underlying cirrhotic change compatible with patient's history. Otherwise unremarkable right upper quadrant ultrasound. Electronically Signed   By: Lovena Le M.D.   On: 03/03/2019 22:47    Microbiology: No results found for this or any previous visit (from the past 240 hour(s)).   Labs: Basic Metabolic Panel: Recent Labs  Lab 03/13/19 0428 03/13/19 1056 03/13/19 1228 03/14/19 0516 03/14/19 1314 03/15/19 0500 03/15/19 1333  NA 139 138 139 138 135 137 138  K 3.9 3.8 3.5 4.0 3.8 3.9 3.6  CL 103 104 102 107 104 106 103  CO2 27 29  --  27 24 24   --   GLUCOSE 129* 183* 135* 135* 179* 137* 180*  BUN 7 8 8 6 7 7  5*  CREATININE 0.60 0.79 0.70 0.70 0.67 0.60 0.50  CALCIUM 8.9 9.1  --  8.7* 8.9 8.9  --    Liver Function Tests: Recent Labs  Lab 03/11/19 0719 03/12/19 0433 03/13/19 0428 03/14/19 0516 03/15/19 0500  AST 80* 60* 33 34 38  ALT 63* 38 26 24 27   ALKPHOS 75 66 51 60 74  BILITOT 1.2 0.9 1.0 0.8 0.8  PROT 6.0* 5.2* 5.3* 5.4* 6.0*  ALBUMIN 3.3* 3.0* 3.0* 3.2* 3.3*   No results for input(s): LIPASE, AMYLASE in the last 168 hours. No results for input(s): AMMONIA in the last 168 hours. CBC: Recent Labs  Lab 03/11/19 0719 03/13/19 0428 03/13/19 0847 03/13/19 1228 03/14/19 0516 03/15/19 0500 03/15/19 1333  WBC 2.2* 2.3* 2.2*  --  2.1* 1.8*  --   NEUTROABS 1.1* 1.1* 1.3*  --  1.4* 0.9*  --   HGB 9.8* 8.9* 10.2* 9.2* 9.4* 9.2* 9.9*  HCT 29.7* 26.7* 30.4* 27.0* 28.8* 28.4* 29.0*  MCV 100.7* 101.9* 100.7*  --  102.9* 102.9*  --   PLT 77* 80* 87*  --  82* 94*  --    Cardiac Enzymes: No results for input(s): CKTOTAL, CKMB, CKMBINDEX, TROPONINI in the last 168 hours. BNP: BNP (last 3 results) No results for input(s): BNP in the last 8760  hours.  ProBNP (last 3 results) No results for input(s): PROBNP in the last 8760 hours.  CBG: Recent Labs  Lab 03/14/19 1641 03/14/19 2205 03/15/19 0657 03/15/19 1208 03/15/19 1716  GLUCAP 164* 136* 142* 127* 164*    Principal Problem:   Acquired pancytopenia (HCC) Active Problems:   Controlled type 2 diabetes mellitus without complication (HCC)   Elevated liver enzymes   Liver cirrhosis secondary to NASH [facility])  Time coordinating discharge: 38 minutes.  Signed:        Timarie Labell, DO [facility]  03/16/2019, 8:35 AM

## 2019-03-18 ENCOUNTER — Emergency Department (HOSPITAL_COMMUNITY): Payer: Federal, State, Local not specified - PPO

## 2019-03-18 ENCOUNTER — Emergency Department (HOSPITAL_COMMUNITY)
Admission: EM | Admit: 2019-03-18 | Discharge: 2019-03-19 | Disposition: A | Discharge status: Home / Self Care | Payer: Federal, State, Local not specified - PPO | Attending: Emergency Medicine | Admitting: Emergency Medicine

## 2019-03-18 ENCOUNTER — Encounter (HOSPITAL_COMMUNITY): Payer: Self-pay | Admitting: Emergency Medicine

## 2019-03-18 ENCOUNTER — Non-Acute Institutional Stay (HOSPITAL_COMMUNITY)
Admission: RE | Admit: 2019-03-18 | Discharge: 2019-03-18 | Disposition: A | Discharge status: Home / Self Care | Payer: Federal, State, Local not specified - PPO | Source: Ambulatory Visit | Attending: Hematology & Oncology | Admitting: Hematology & Oncology

## 2019-03-18 DIAGNOSIS — T82838A Hemorrhage of vascular prosthetic devices, implants and grafts, initial encounter: Secondary | ICD-10-CM | POA: Insufficient documentation

## 2019-03-18 DIAGNOSIS — E119 Type 2 diabetes mellitus without complications: Secondary | ICD-10-CM | POA: Diagnosis not present

## 2019-03-18 DIAGNOSIS — I10 Essential (primary) hypertension: Secondary | ICD-10-CM | POA: Diagnosis not present

## 2019-03-18 DIAGNOSIS — Y658 Other specified misadventures during surgical and medical care: Secondary | ICD-10-CM | POA: Insufficient documentation

## 2019-03-18 DIAGNOSIS — Y733 Surgical instruments, materials and gastroenterology and urology devices (including sutures) associated with adverse incidents: Secondary | ICD-10-CM | POA: Diagnosis not present

## 2019-03-18 DIAGNOSIS — R509 Fever, unspecified: Secondary | ICD-10-CM | POA: Diagnosis not present

## 2019-03-18 DIAGNOSIS — Z79899 Other long term (current) drug therapy: Secondary | ICD-10-CM | POA: Diagnosis not present

## 2019-03-18 DIAGNOSIS — M311 Thrombotic microangiopathy: Secondary | ICD-10-CM | POA: Insufficient documentation

## 2019-03-18 LAB — BASIC METABOLIC PANEL
Anion gap: 6 (ref 5–15)
Anion gap: 7 (ref 5–15)
BUN: 5 mg/dL — ABNORMAL LOW (ref 6–20)
BUN: 8 mg/dL (ref 6–20)
CO2: 24 mmol/L (ref 22–32)
CO2: 25 mmol/L (ref 22–32)
Calcium: 8.4 mg/dL — ABNORMAL LOW (ref 8.9–10.3)
Calcium: 9.2 mg/dL (ref 8.9–10.3)
Chloride: 106 mmol/L (ref 98–111)
Chloride: 106 mmol/L (ref 98–111)
Creatinine, Ser: 0.56 mg/dL (ref 0.44–1.00)
Creatinine, Ser: 0.54 mg/dL (ref 0.44–1.00)
GFR calc Af Amer: >60 mL/min (ref >60)
GFR calc Af Amer: >60 mL/min (ref >60)
GFR calc non Af Amer: >60 mL/min (ref >60)
GFR calc non Af Amer: >60 mL/min (ref >60)
Glucose, Bld: 205 mg/dL — ABNORMAL HIGH (ref 70–99)
Glucose, Bld: 208 mg/dL — ABNORMAL HIGH (ref 70–99)
Potassium: 3.8 mmol/L (ref 3.5–5.1)
Potassium: 3.6 mmol/L (ref 3.5–5.1)
Sodium: 136 mmol/L (ref 135–145)
Sodium: 138 mmol/L (ref 135–145)

## 2019-03-18 LAB — CBC
HCT: 27.6 % — ABNORMAL LOW (ref 36.0–46.0)
HCT: 32.5 % — ABNORMAL LOW (ref 36.0–46.0)
Hemoglobin: 8.9 g/dL — ABNORMAL LOW (ref 12.0–15.0)
Hemoglobin: 10.4 g/dL — ABNORMAL LOW (ref 12.0–15.0)
MCH: 33.7 pg (ref 26.0–34.0)
MCH: 32.9 pg (ref 26.0–34.0)
MCHC: 32.2 g/dL (ref 30.0–36.0)
MCHC: 32 g/dL (ref 30.0–36.0)
MCV: 104.5 fL — ABNORMAL HIGH (ref 80.0–100.0)
MCV: 102.8 fL — ABNORMAL HIGH (ref 80.0–100.0)
Platelets: 78 10*3/uL — ABNORMAL LOW (ref 150–400)
Platelets: 92 10*3/uL — ABNORMAL LOW (ref 150–400)
RBC: 2.64 MIL/uL — ABNORMAL LOW (ref 3.87–5.11)
RBC: 3.16 MIL/uL — ABNORMAL LOW (ref 3.87–5.11)
RDW: 15.3 % (ref 11.5–15.5)
RDW: 15 % (ref 11.5–15.5)
WBC: 2.3 10*3/uL — ABNORMAL LOW (ref 4.0–10.5)
WBC: 4.7 10*3/uL (ref 4.0–10.5)
nRBC: 0 % (ref 0.0–0.2)
nRBC: 0 % (ref 0.0–0.2)

## 2019-03-18 LAB — LACTIC ACID, PLASMA: Lactic Acid, Venous: 1.3 mmol/L (ref 0.5–1.9)

## 2019-03-18 MED ORDER — ANTICOAGULANT SODIUM CITRATE 4% (200MG/5ML) IV SOLN
5.0000 mL | Freq: Once | Status: AC
  Administered 2019-03-18: 5 mL
  Filled 2019-03-18: qty 5

## 2019-03-18 MED ORDER — DIPHENHYDRAMINE HCL 25 MG PO CAPS
ORAL_CAPSULE | ORAL | Status: AC
  Administered 2019-03-18: 25 mg via ORAL
  Filled 2019-03-18: qty 1

## 2019-03-18 MED ORDER — ACETAMINOPHEN 325 MG PO TABS: 650.0000 mg | ORAL_TABLET | ORAL | Status: DC | PRN

## 2019-03-18 MED ORDER — CALCIUM CARBONATE ANTACID 500 MG PO CHEW
CHEWABLE_TABLET | ORAL | Status: AC
  Administered 2019-03-18: 400 mg via ORAL
  Filled 2019-03-18: qty 4

## 2019-03-18 MED ORDER — DIPHENHYDRAMINE HCL 25 MG PO CAPS: 25.0000 mg | ORAL_CAPSULE | Freq: Four times a day (QID) | ORAL | Status: DC | PRN

## 2019-03-18 MED ORDER — CALCIUM CARBONATE ANTACID 500 MG PO CHEW
2.0000 | CHEWABLE_TABLET | ORAL | Status: AC
  Administered 2019-03-18: 400 mg via ORAL

## 2019-03-18 MED ORDER — SODIUM CHLORIDE 0.9 % IV SOLN
2.0000 g | Freq: Once | INTRAVENOUS | Status: AC
  Administered 2019-03-18: 2 g via INTRAVENOUS
  Filled 2019-03-18: qty 20

## 2019-03-18 MED ORDER — ACD FORMULA A 0.73-2.45-2.2 GM/100ML VI SOLN
Status: AC
  Administered 2019-03-18: 500 mL via INTRAVENOUS
  Filled 2019-03-18: qty 500

## 2019-03-18 MED ORDER — ACD FORMULA A 0.73-2.45-2.2 GM/100ML VI SOLN: 500.0000 mL | Status: DC

## 2019-03-18 NOTE — ED Triage Notes (Signed)
Pt here with some oozing /bleeding from her vas cath that she gets plasmapheresis , she had it placed friday and two treatments , pt also spiked a fever of 102 at home today

## 2019-03-19 ENCOUNTER — Other Ambulatory Visit: Payer: Self-pay | Admitting: Hematology & Oncology

## 2019-03-19 ENCOUNTER — Encounter: Payer: Self-pay | Admitting: Hematology & Oncology

## 2019-03-19 ENCOUNTER — Telehealth: Payer: Self-pay | Admitting: *Deleted

## 2019-03-19 DIAGNOSIS — M3119 Other thrombotic microangiopathy: Secondary | ICD-10-CM

## 2019-03-19 DIAGNOSIS — M311 Thrombotic microangiopathy: Secondary | ICD-10-CM

## 2019-03-19 HISTORY — DX: Other thrombotic microangiopathy: M31.19

## 2019-03-19 LAB — THERAPEUTIC PLASMA EXCHANGE (BLOOD BANK)
Plasma Exchange: 2539
Plasma volume needed: 2532
Unit division: 0
Unit division: 0
Unit division: 0
Unit division: 0
Unit division: 0
Unit division: 0
Unit division: 0
Unit division: 0

## 2019-03-19 NOTE — ED Provider Notes (Signed)
[facility] EMERGENCY DEPARTMENT Provider Note   CSN: [id number] Arrival date & time: 03/18/19  1631     History No chief complaint on file.   Rubyann Lingle is a 59 y.o. female.  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  Patient was recently admitted for presumed TTP.  She was evaluated by oncology while in the hospital.  She initiated plasmapheresis.  She was discharged home with every other day plasmapheresis treatments.  She states she had been doing well.  After receiving her outpatient treatment yesterday she got home and noted bleeding at the insertion of the catheter site.  Bleeding stopped on its own.  She also states that she felt chilled when she got home and noted a temperature of 102.  She denies any infectious symptoms including shortness of breath, cough, nausea, vomiting, diarrhea, abdominal pain, urinary symptoms.  She denies any loss of sense of taste or smell or Covid symptoms.  Past Medical History:  Diagnosis Date  . Allergy   . Arthritis   . Boil   . 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 Diabetes ([place])    type 2  . Elevated liver enzymes   . Fatty liver   . Hyperlipemia   . Hypertension   . 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 PVC's (premature ventricular contractions)     Patient Active Problem List   Diagnosis Date Noted  . Neutropenia with fever ([place]) 03/04/2019  . Insomnia 02/26/2019  . Acquired pancytopenia ([place]) 11/01/2018  . Liver cirrhosis secondary to NASH ([place]) 04/25/2018  . 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 Spinal stenosis of lumbar region 01/17/2018  . Palpitations 04/15/2015  . Controlled type 2 diabetes mellitus without complication ([place]) 50/93/2671  . Elevated liver enzymes 03/31/2014  . Tinea pedis 07/31/2013  .  Foot and toe(s), blister, infected 07/31/2013  . 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 Pain in joint, ankle and foot 08/31/2012    Past Surgical History:  Procedure Laterality Date  . COLONOSCOPY  2014  . ECTOPIC PREGNANCY SURGERY  1990  . IR FLUORO GUIDE CV LINE RIGHT  03/05/2019  . IR FLUORO GUIDE CV LINE RIGHT  03/15/2019  . IR US GUIDE VASC ACCESS RIGHT  03/05/2019  . IR US GUIDE VASC ACCESS RIGHT  03/15/2019  . TONSILLECTOMY  1980  . UPPER GI ENDOSCOPY  08/2017   [facility]     OB History    Gravida  1   Para  0   Term  0   Preterm  0   AB  1   Living  1     SAB  0   TAB  0   Ectopic  1   Multiple  0   Live Births  0           Family History  Problem Relation Age of Onset  . Hypertension Mother   . Diabetes Mother   . Heart attack Mother 42  . Asthma Mother        Childhood  . Arthritis Mother   . Heart disease Mother   . Hyperlipidemia Mother   . Hypertension Sister   . Diabetes Sister   . Asthma Sister 40       asthma attack cause of death  . Diabetes Brother   .  Hypertension Brother   . Diabetes Brother   . Hypertension Brother   . Hypertension Maternal Grandmother   . Heart attack Maternal Grandfather 60  . Colon cancer Neg Hx     Social History   Tobacco Use  . Smoking status: Never Smoker  . Smokeless tobacco: Never Used  Substance Use Topics  . Alcohol use: Yes    Alcohol/week: 3.0 standard drinks    Types: 3 Standard drinks or equivalent per week    Comment: trying to reduce  . Drug use: No    Home Medications Prior to Admission medications   Medication Sig Start Date End Date Taking? Authorizing Provider  alum & mag hydroxide-simeth (MAALOX PLUS) 400-400-40 MG/5ML suspension Take 10 mLs by mouth every 6 (six) hours as needed for indigestion.     [provider]  calcium carbonate (TUMS - DOSED IN MG ELEMENTAL CALCIUM) 500 MG chewable tablet Chew 2 tablets (400 mg of elemental calcium total) by  mouth every 3 (three) hours. 03/15/19   Swayze, Ava, DO  cholecalciferol (VITAMIN D3) 25 MCG (1000 UT) tablet Take 1,000 Units by mouth daily.    [provider]  dextromethorphan-guaiFENesin (MUCINEX DM) 30-600 MG 12hr tablet Take 2 tablets by mouth 2 (two) times daily as needed for cough.    [provider]  diphenhydrAMINE (BENADRYL) 50 MG capsule Take 1 capsule (50 mg total) by mouth daily as needed (on call for plasma exchange). 03/15/19   Swayze, Ava, DO  fluorometholone (FML) 0.1 % ophthalmic suspension Place 1 drop into the left eye 4 (four) times daily.     [provider]  folic acid (FOLVITE) 1 MG tablet Take 2 tablets (2 mg total) by mouth daily. 03/15/19   Swayze, Ava, DO  glucose blood test strip One touch ultra 11/11/15   [provider]  Multiple Vitamins-Minerals (MULTI FOR HER 50+) TABS Take 1 tablet by mouth daily.    [provider]  senna-docusate (SENOKOT-S) 8.6-50 MG tablet Take 2 tablets by mouth 2 (two) times daily. 03/15/19   Swayze, Ava, DO  traMADol (ULTRAM) 50 MG tablet Take 1 tablet (50 mg total) by mouth every 6 (six) hours as needed. 03/15/19 03/14/20  Swayze, Ava, DO    Allergies    Metformin and related, Sulfa antibiotics, and Sulfasalazine  Review of Systems   Review of Systems  Constitutional: Positive for chills and fever.  Respiratory: Negative for cough and shortness of breath.   Cardiovascular: Negative for chest pain.  Gastrointestinal: Negative for abdominal pain, nausea and vomiting.  Genitourinary: Negative for dysuria.  Skin: Negative for color change and rash.  Neurological: Negative for headaches.  All other systems reviewed and are negative.   Physical Exam Updated Vital Signs BP 104/69 (BP Location: Left Arm)   Pulse 93   Temp 98.9 F (37.2 C) (Oral)   Resp 18   LMP 04/30/2012   SpO2 100%   Physical Exam Vitals and nursing note reviewed.  Constitutional:      Appearance: She is well-developed. She  is not ill-appearing.  HENT:     Head: Normocephalic and atraumatic.     Mouth/Throat:     Mouth: Mucous membranes are moist.  Eyes:     Pupils: Pupils are equal, round, and reactive to light.  Cardiovascular:     Rate and Rhythm: Normal rate and regular rhythm.     Heart sounds: Normal heart sounds.     Comments: Tunneled catheter right upper chest, dressing with  dried blood noted, no active bleeding, no overlying skin changes or erythema, no fluctuance, slight tenderness to palpation Pulmonary:     Effort: Pulmonary effort is normal. No respiratory distress.     Breath sounds: No wheezing.  Abdominal:     Palpations: Abdomen is soft.     Tenderness: There is no abdominal tenderness.  Musculoskeletal:     Cervical back: Neck supple.     Right lower leg: No edema.     Left lower leg: No edema.  Skin:    General: Skin is warm and dry.  Neurological:     Mental Status: She is alert and oriented to person, place, and time.  Psychiatric:        Mood and Affect: Mood normal.     ED Results / Procedures / Treatments   Labs (all labs ordered are listed, but only abnormal results are displayed) Labs Reviewed  CBC - Abnormal; Notable for the following components:      Result Value   RBC 3.16 (*)    Hemoglobin 10.4 (*)    HCT 32.5 (*)    MCV 102.8 (*)    Platelets 92 (*)    All other components within normal limits  BASIC METABOLIC PANEL - Abnormal; Notable for the following components:   Glucose, Bld 208 (*)    All other components within normal limits  CULTURE, BLOOD (ROUTINE X 2)  CULTURE, BLOOD (ROUTINE X 2)  LACTIC ACID, PLASMA  LACTIC ACID, PLASMA    EKG None  Radiology DG Chest 2 View  Result Date: 03/18/2019 CLINICAL DATA:  Fever EXAM: CHEST - 2 VIEW COMPARISON:  03/03/2019 FINDINGS: Interval placement of dual lumen right internal jugular approach central venous catheter with distal tip terminating at the level of the distal SVC. The heart size and mediastinal  contours are within normal limits. Both lungs are clear. The visualized skeletal structures are unremarkable. IMPRESSION: 1. No active cardiopulmonary disease. 2. Interval placement of right-sided hemodialysis catheter. No pneumothorax. Electronically Signed   By: Davina Poke D.O.   On: 03/18/2019 18:01    Procedures Procedures (including critical care time)  Medications Ordered in ED Medications - No data to display  ED Course  I have reviewed the triage vital signs and the nursing notes.  Pertinent labs & imaging results that were available during my care of the patient were reviewed by me and considered in my medical decision making (see chart for details).    MDM Rules/Calculators/A&P                      Patient presents with bleeding from her tunneled catheter site.  Bleeding stopped spontaneously.  She has dried blood on the dressing but no active bleeding on exam.  She is hemodynamically stable and vital signs are reassuring.  Initial temperature 100.3 here.  Patient reports that she did not take any medications including Tylenol or Motrin at home.  Work-up initiated from triage reviewed.  Her white blood cell count is improving.  Hemoglobin and platelets are improving and/or stable.  Creatinine is 0.54.  Lactate is 1.3.  She is generally well-appearing without signs or symptoms of sepsis.  She denies any infectious symptoms.  Fever could be related to recent plasmapheresis treatment.  However, given recent placement of tunneled catheter, will obtain blood cultures.  She is very well-appearing.  I discussed with her that given her fever, she may need repeat Covid testing to proceed with further outpatient treatments.  Patient declines testing at this time.  Dressing was replaced and no recurrent bleeding was noted.  At this time I feel she is stable for discharge.  We will have her follow-up closely with Dr. Marin Olp.  She was given strict return precautions.  After history, exam, and  medical workup I feel the patient has been appropriately medically screened and is safe for discharge home. Pertinent diagnoses were discussed with the patient. Patient was given return precautions.   Final Clinical Impression(s) / ED Diagnoses Final diagnoses:  Bleeding due to dialysis catheter placement, initial encounter ([place])  Fever, unspecified fever cause    Rx / DC Orders ED Discharge Orders    None       Merryl Hacker, MD 03/19/19 0401

## 2019-03-19 NOTE — Discharge Instructions (Addendum)
You were seen today for bleeding around your catheter site.  This stopped on its own.  There is no recurrent bleeding.  Your work-up is reassuring.  Your blood counts are improving and or stable, including her hemoglobin and platelets.  Monitor self closely for recurrent fevers.  Your fever may be due to recent plasmapheresis.  However, if you develop any infectious symptoms you should be reevaluated.

## 2019-03-19 NOTE — ED Notes (Signed)
Patient verbalizes understanding of discharge instructions. Opportunity for questioning and answers were provided. Armband removed by staff, pt discharged from ED. Pt. ambulatory and discharged home.  

## 2019-03-19 NOTE — ED Notes (Addendum)
Pt came in with bleeding coming form her cath site. Per MD orders, RN removed old dressing and cleaned site using sterile technique. Cleaned using chlorhexidine and applied a SorbaView SHIELD, size Medium, along with a 2.5cm x 4.44m BioPatch.

## 2019-03-19 NOTE — Telephone Encounter (Signed)
Call received from patient to inform Dr. Marin Olp that she went to the ED yesterday d/t her dialysis catheter was bleeding.  Pt states that dressing to catheter was changed and has had no further bleeding since the ED.  Dr. Marin Olp notified.

## 2019-03-20 ENCOUNTER — Non-Acute Institutional Stay (HOSPITAL_COMMUNITY)
Admission: AD | Admit: 2019-03-20 | Discharge: 2019-03-20 | Disposition: A | Discharge status: Home / Self Care | Payer: Federal, State, Local not specified - PPO | Source: Ambulatory Visit | Attending: Hematology & Oncology | Admitting: Hematology & Oncology

## 2019-03-20 DIAGNOSIS — M311 Thrombotic microangiopathy: Secondary | ICD-10-CM | POA: Diagnosis not present

## 2019-03-20 LAB — CBC
HCT: 26.6 % — ABNORMAL LOW (ref 36.0–46.0)
Hemoglobin: 8.7 g/dL — ABNORMAL LOW (ref 12.0–15.0)
MCH: 33.6 pg (ref 26.0–34.0)
MCHC: 32.7 g/dL (ref 30.0–36.0)
MCV: 102.7 fL — ABNORMAL HIGH (ref 80.0–100.0)
Platelets: 78 10*3/uL — ABNORMAL LOW (ref 150–400)
RBC: 2.59 MIL/uL — ABNORMAL LOW (ref 3.87–5.11)
RDW: 14.8 % (ref 11.5–15.5)
WBC: 2.5 10*3/uL — ABNORMAL LOW (ref 4.0–10.5)
nRBC: 0 % (ref 0.0–0.2)

## 2019-03-20 LAB — COMPREHENSIVE METABOLIC PANEL
ALT: 25 U/L (ref 0–44)
AST: 40 U/L (ref 15–41)
Albumin: 3.2 g/dL — ABNORMAL LOW (ref 3.5–5.0)
Alkaline Phosphatase: 66 U/L (ref 38–126)
Anion gap: 8 (ref 5–15)
BUN: 6 mg/dL (ref 6–20)
CO2: 25 mmol/L (ref 22–32)
Calcium: 8.5 mg/dL — ABNORMAL LOW (ref 8.9–10.3)
Chloride: 106 mmol/L (ref 98–111)
Creatinine, Ser: 0.65 mg/dL (ref 0.44–1.00)
GFR calc Af Amer: >60 mL/min (ref >60)
GFR calc non Af Amer: >60 mL/min (ref >60)
Glucose, Bld: 190 mg/dL — ABNORMAL HIGH (ref 70–99)
Potassium: 3.9 mmol/L (ref 3.5–5.1)
Sodium: 139 mmol/L (ref 135–145)
Total Bilirubin: 0.7 mg/dL (ref 0.3–1.2)
Total Protein: 5.8 g/dL — ABNORMAL LOW (ref 6.5–8.1)

## 2019-03-20 LAB — RETICULOCYTES
Immature Retic Fract: 13.7 % (ref 2.3–15.9)
RBC.: 2.6 MIL/uL — ABNORMAL LOW (ref 3.87–5.11)
Retic Count, Absolute: 39.3 10*3/uL (ref 19.0–186.0)
Retic Ct Pct: 1.5 % (ref 0.4–3.1)

## 2019-03-20 LAB — POCT I-STAT, CHEM 8
BUN: 5 mg/dL — ABNORMAL LOW (ref 6–20)
Calcium, Ion: 1.17 mmol/L (ref 1.15–1.40)
Chloride: 103 mmol/L (ref 98–111)
Creatinine, Ser: 0.6 mg/dL (ref 0.44–1.00)
Glucose, Bld: 183 mg/dL — ABNORMAL HIGH (ref 70–99)
HCT: 25 % — ABNORMAL LOW (ref 36.0–46.0)
Hemoglobin: 8.5 g/dL — ABNORMAL LOW (ref 12.0–15.0)
Potassium: 3.8 mmol/L (ref 3.5–5.1)
Sodium: 140 mmol/L (ref 135–145)
TCO2: 25 mmol/L (ref 22–32)

## 2019-03-20 LAB — LACTATE DEHYDROGENASE: LDH: 173 U/L (ref 98–192)

## 2019-03-20 MED ORDER — ACETAMINOPHEN 325 MG PO TABS: 650.0000 mg | ORAL_TABLET | ORAL | Status: DC | PRN

## 2019-03-20 MED ORDER — ANTICOAGULANT SODIUM CITRATE 4% (200MG/5ML) IV SOLN
5.0000 mL | Freq: Once | Status: AC
  Administered 2019-03-20: 5 mL
  Filled 2019-03-20 (×2): qty 5

## 2019-03-20 MED ORDER — DIPHENHYDRAMINE HCL 25 MG PO CAPS: 25.0000 mg | ORAL_CAPSULE | Freq: Four times a day (QID) | ORAL | Status: DC | PRN

## 2019-03-20 MED ORDER — SODIUM CHLORIDE 0.9 % IV SOLN
2.0000 g | Freq: Once | INTRAVENOUS | Status: AC
  Administered 2019-03-20: 2 g via INTRAVENOUS
  Filled 2019-03-20: qty 20

## 2019-03-20 MED ORDER — ACD FORMULA A 0.73-2.45-2.2 GM/100ML VI SOLN
500.0000 mL | Status: DC
  Administered 2019-03-20: 500 mL via INTRAVENOUS

## 2019-03-20 MED ORDER — ACD FORMULA A 0.73-2.45-2.2 GM/100ML VI SOLN
Status: AC
  Filled 2019-03-20: qty 500

## 2019-03-20 MED ORDER — CALCIUM GLUCONATE 10 % IV SOLN: 2.0000 g | Freq: Once | INTRAVENOUS | Status: DC

## 2019-03-20 MED ORDER — CALCIUM CARBONATE ANTACID 500 MG PO CHEW
CHEWABLE_TABLET | ORAL | Status: AC
  Administered 2019-03-20: 400 mg
  Filled 2019-03-20: qty 2

## 2019-03-20 NOTE — Progress Notes (Signed)
TPE tx complete without complications.  VSS post treatment.  Patient aware on next scheduled outpatient appointment on Friday, January 15.  Patient discharged to home with self care.

## 2019-03-21 LAB — THERAPEUTIC PLASMA EXCHANGE (BLOOD BANK)
Plasma Exchange: 2522
Plasma volume needed: 2522
Unit division: 0
Unit division: 0
Unit division: 0
Unit division: 0
Unit division: 0
Unit division: 0

## 2019-03-22 ENCOUNTER — Non-Acute Institutional Stay (HOSPITAL_COMMUNITY)
Admission: AD | Admit: 2019-03-22 | Discharge: 2019-03-22 | Disposition: A | Discharge status: Home / Self Care | Payer: Federal, State, Local not specified - PPO | Source: Ambulatory Visit | Attending: Hematology & Oncology | Admitting: Hematology & Oncology

## 2019-03-22 DIAGNOSIS — M311 Thrombotic microangiopathy: Secondary | ICD-10-CM | POA: Diagnosis not present

## 2019-03-22 LAB — RETICULOCYTES
Immature Retic Fract: 17.2 % — ABNORMAL HIGH (ref 2.3–15.9)
RBC.: 2.56 MIL/uL — ABNORMAL LOW (ref 3.87–5.11)
Retic Count, Absolute: 44.5 10*3/uL (ref 19.0–186.0)
Retic Ct Pct: 1.7 % (ref 0.4–3.1)

## 2019-03-22 LAB — LACTATE DEHYDROGENASE: LDH: 164 U/L (ref 98–192)

## 2019-03-22 LAB — CBC
HCT: 26.2 % — ABNORMAL LOW (ref 36.0–46.0)
Hemoglobin: 8.6 g/dL — ABNORMAL LOW (ref 12.0–15.0)
MCH: 33.6 pg (ref 26.0–34.0)
MCHC: 32.8 g/dL (ref 30.0–36.0)
MCV: 102.3 fL — ABNORMAL HIGH (ref 80.0–100.0)
Platelets: 79 10*3/uL — ABNORMAL LOW (ref 150–400)
RBC: 2.56 MIL/uL — ABNORMAL LOW (ref 3.87–5.11)
RDW: 14.6 % (ref 11.5–15.5)
WBC: 1.5 10*3/uL — ABNORMAL LOW (ref 4.0–10.5)
nRBC: 0 % (ref 0.0–0.2)

## 2019-03-22 LAB — BASIC METABOLIC PANEL
Anion gap: 7 (ref 5–15)
BUN: <5 mg/dL — ABNORMAL LOW (ref 6–20)
CO2: 24 mmol/L (ref 22–32)
Calcium: 8.9 mg/dL (ref 8.9–10.3)
Chloride: 108 mmol/L (ref 98–111)
Creatinine, Ser: 0.75 mg/dL (ref 0.44–1.00)
GFR calc Af Amer: >60 mL/min (ref >60)
GFR calc non Af Amer: >60 mL/min (ref >60)
Glucose, Bld: 231 mg/dL — ABNORMAL HIGH (ref 70–99)
Potassium: 3.7 mmol/L (ref 3.5–5.1)
Sodium: 139 mmol/L (ref 135–145)

## 2019-03-22 LAB — POCT I-STAT, CHEM 8
BUN: <3 mg/dL — ABNORMAL LOW (ref 6–20)
Calcium, Ion: 1.32 mmol/L (ref 1.15–1.40)
Chloride: 103 mmol/L (ref 98–111)
Creatinine, Ser: 0.5 mg/dL (ref 0.44–1.00)
Glucose, Bld: 221 mg/dL — ABNORMAL HIGH (ref 70–99)
HCT: 35 % — ABNORMAL LOW (ref 36.0–46.0)
Hemoglobin: 11.9 g/dL — ABNORMAL LOW (ref 12.0–15.0)
Potassium: 3.7 mmol/L (ref 3.5–5.1)
Sodium: 142 mmol/L (ref 135–145)
TCO2: 24 mmol/L (ref 22–32)

## 2019-03-22 MED ORDER — ACETAMINOPHEN 325 MG PO TABS: 650.0000 mg | ORAL_TABLET | ORAL | Status: DC | PRN

## 2019-03-22 MED ORDER — DIPHENHYDRAMINE HCL 25 MG PO CAPS: 25.0000 mg | ORAL_CAPSULE | Freq: Four times a day (QID) | ORAL | Status: DC | PRN

## 2019-03-22 MED ORDER — CALCIUM GLUCONATE-NACL 2-0.675 GM/100ML-% IV SOLN
2.0000 g | Freq: Once | INTRAVENOUS | Status: AC
  Administered 2019-03-22: 2000 mg via INTRAVENOUS
  Filled 2019-03-22: qty 100

## 2019-03-22 MED ORDER — SODIUM CHLORIDE 0.9 % IV SOLN: 2.0000 g | Freq: Once | INTRAVENOUS | Status: DC

## 2019-03-22 MED ORDER — CALCIUM CARBONATE ANTACID 500 MG PO CHEW: 2.0000 | CHEWABLE_TABLET | ORAL | Status: DC

## 2019-03-22 MED ORDER — CALCIUM CARBONATE ANTACID 500 MG PO CHEW
CHEWABLE_TABLET | ORAL | Status: AC
  Administered 2019-03-22: 400 mg via ORAL
  Filled 2019-03-22: qty 4

## 2019-03-22 MED ORDER — ACD FORMULA A 0.73-2.45-2.2 GM/100ML VI SOLN: 500.0000 mL | Status: DC

## 2019-03-22 MED ORDER — DIPHENHYDRAMINE HCL 25 MG PO CAPS
ORAL_CAPSULE | ORAL | Status: AC
  Administered 2019-03-22: 25 mg via ORAL
  Filled 2019-03-22: qty 1

## 2019-03-22 MED ORDER — ANTICOAGULANT SODIUM CITRATE 4% (200MG/5ML) IV SOLN
5.0000 mL | Freq: Once | Status: AC
  Administered 2019-03-22: 5 mL
  Filled 2019-03-22: qty 5

## 2019-03-22 MED ORDER — ACD FORMULA A 0.73-2.45-2.2 GM/100ML VI SOLN
Status: AC
  Administered 2019-03-22: 500 mL via INTRAVENOUS
  Filled 2019-03-22: qty 500

## 2019-03-23 LAB — THERAPEUTIC PLASMA EXCHANGE (BLOOD BANK)
Plasma Exchange: 2522
Plasma volume needed: 2522
Unit division: 0
Unit division: 0
Unit division: 0
Unit division: 0
Unit division: 0
Unit division: 0
Unit division: 0
Unit division: 0

## 2019-03-24 LAB — CULTURE, BLOOD (ROUTINE X 2)
Culture: NO GROWTH
Special Requests: ADEQUATE

## 2019-03-25 ENCOUNTER — Non-Acute Institutional Stay (HOSPITAL_COMMUNITY)
Admission: AD | Admit: 2019-03-25 | Discharge: 2019-03-25 | Disposition: A | Discharge status: Home / Self Care | Payer: Federal, State, Local not specified - PPO | Source: Ambulatory Visit | Attending: Hematology & Oncology | Admitting: Hematology & Oncology

## 2019-03-25 DIAGNOSIS — D61818 Other pancytopenia: Secondary | ICD-10-CM | POA: Insufficient documentation

## 2019-03-25 DIAGNOSIS — Z882 Allergy status to sulfonamides status: Secondary | ICD-10-CM | POA: Diagnosis not present

## 2019-03-25 DIAGNOSIS — M311 Thrombotic microangiopathy: Secondary | ICD-10-CM | POA: Diagnosis not present

## 2019-03-25 DIAGNOSIS — I10 Essential (primary) hypertension: Secondary | ICD-10-CM | POA: Insufficient documentation

## 2019-03-25 DIAGNOSIS — Z888 Allergy status to other drugs, medicaments and biological substances status: Secondary | ICD-10-CM | POA: Insufficient documentation

## 2019-03-25 DIAGNOSIS — K7581 Nonalcoholic steatohepatitis (NASH): Secondary | ICD-10-CM | POA: Insufficient documentation

## 2019-03-25 DIAGNOSIS — E119 Type 2 diabetes mellitus without complications: Secondary | ICD-10-CM | POA: Insufficient documentation

## 2019-03-25 LAB — POCT I-STAT, CHEM 8
BUN: 4 mg/dL — ABNORMAL LOW (ref 6–20)
Calcium, Ion: 1.26 mmol/L (ref 1.15–1.40)
Chloride: 103 mmol/L (ref 98–111)
Creatinine, Ser: 0.6 mg/dL (ref 0.44–1.00)
Glucose, Bld: 249 mg/dL — ABNORMAL HIGH (ref 70–99)
HCT: 24 % — ABNORMAL LOW (ref 36.0–46.0)
Hemoglobin: 8.2 g/dL — ABNORMAL LOW (ref 12.0–15.0)
Potassium: 3.7 mmol/L (ref 3.5–5.1)
Sodium: 141 mmol/L (ref 135–145)
TCO2: 24 mmol/L (ref 22–32)

## 2019-03-25 MED ORDER — SODIUM CHLORIDE 0.9 % IV SOLN: 2.0000 g | Freq: Once | INTRAVENOUS | Status: DC

## 2019-03-25 MED ORDER — ANTICOAGULANT SODIUM CITRATE 4% (200MG/5ML) IV SOLN
5.0000 mL | Freq: Once | Status: AC
  Administered 2019-03-25: 5 mL
  Filled 2019-03-25: qty 5

## 2019-03-25 MED ORDER — ACETAMINOPHEN 325 MG PO TABS: 650.0000 mg | ORAL_TABLET | ORAL | Status: DC | PRN

## 2019-03-25 MED ORDER — CALCIUM CARBONATE ANTACID 500 MG PO CHEW
2.0000 | CHEWABLE_TABLET | ORAL | Status: DC
  Administered 2019-03-25: 400 mg via ORAL

## 2019-03-25 MED ORDER — ACD FORMULA A 0.73-2.45-2.2 GM/100ML VI SOLN
Status: AC
  Filled 2019-03-25: qty 500

## 2019-03-25 MED ORDER — CALCIUM GLUCONATE-NACL 2-0.675 GM/100ML-% IV SOLN
2.0000 g | Freq: Once | INTRAVENOUS | Status: AC
  Administered 2019-03-25: 2000 mg via INTRAVENOUS
  Filled 2019-03-25: qty 100

## 2019-03-25 MED ORDER — ACD FORMULA A 0.73-2.45-2.2 GM/100ML VI SOLN
500.0000 mL | Status: DC
  Administered 2019-03-25: 500 mL via INTRAVENOUS

## 2019-03-25 MED ORDER — CALCIUM CARBONATE ANTACID 500 MG PO CHEW
CHEWABLE_TABLET | ORAL | Status: AC
  Filled 2019-03-25: qty 2

## 2019-03-25 MED ORDER — DIPHENHYDRAMINE HCL 25 MG PO CAPS
ORAL_CAPSULE | ORAL | Status: AC
  Administered 2019-03-25: 25 mg
  Filled 2019-03-25: qty 1

## 2019-03-25 MED ORDER — DIPHENHYDRAMINE HCL 25 MG PO CAPS: 25.0000 mg | ORAL_CAPSULE | Freq: Four times a day (QID) | ORAL | Status: DC | PRN

## 2019-03-25 NOTE — Progress Notes (Signed)
TPE tx complete without adverse events.  VSS post treatment.  Patient aware of next scheduled outpatient appointment on Thursday, March 28, 2019.  Patient discharged to home with self care.

## 2019-03-26 ENCOUNTER — Inpatient Hospital Stay: Payer: Federal, State, Local not specified - PPO

## 2019-03-26 ENCOUNTER — Encounter: Payer: Self-pay | Admitting: Hematology & Oncology

## 2019-03-26 ENCOUNTER — Other Ambulatory Visit: Payer: Self-pay

## 2019-03-26 ENCOUNTER — Inpatient Hospital Stay: Payer: Federal, State, Local not specified - PPO | Attending: Hematology | Admitting: Hematology & Oncology

## 2019-03-26 ENCOUNTER — Telehealth: Payer: Self-pay | Admitting: Hematology & Oncology

## 2019-03-26 VITALS — BP 148/74 | HR 90 | Temp 98.8°F | Resp 18 | Ht 62.0 in | Wt 153.1 lb

## 2019-03-26 DIAGNOSIS — Z79899 Other long term (current) drug therapy: Secondary | ICD-10-CM | POA: Insufficient documentation

## 2019-03-26 DIAGNOSIS — K7581 Nonalcoholic steatohepatitis (NASH): Secondary | ICD-10-CM | POA: Diagnosis not present

## 2019-03-26 DIAGNOSIS — M3119 Other thrombotic microangiopathy: Secondary | ICD-10-CM

## 2019-03-26 DIAGNOSIS — M311 Thrombotic microangiopathy: Secondary | ICD-10-CM

## 2019-03-26 DIAGNOSIS — K7460 Unspecified cirrhosis of liver: Secondary | ICD-10-CM | POA: Diagnosis not present

## 2019-03-26 DIAGNOSIS — M7989 Other specified soft tissue disorders: Secondary | ICD-10-CM | POA: Insufficient documentation

## 2019-03-26 DIAGNOSIS — D72819 Decreased white blood cell count, unspecified: Secondary | ICD-10-CM | POA: Insufficient documentation

## 2019-03-26 LAB — THERAPEUTIC PLASMA EXCHANGE (BLOOD BANK)
Plasma Exchange: 2522
Plasma volume needed: 2522
Unit division: 0
Unit division: 0
Unit division: 0
Unit division: 0
Unit division: 0
Unit division: 0
Unit division: 0
Unit division: 0

## 2019-03-26 LAB — CMP (CANCER CENTER ONLY)
ALT: 25 U/L (ref 0–44)
AST: 50 U/L — ABNORMAL HIGH (ref 15–41)
Albumin: 3.7 g/dL (ref 3.5–5.0)
Alkaline Phosphatase: 86 U/L (ref 38–126)
Anion gap: 5 (ref 5–15)
BUN: 6 mg/dL (ref 6–20)
CO2: 26 mmol/L (ref 22–32)
Calcium: 9.2 mg/dL (ref 8.9–10.3)
Chloride: 107 mmol/L (ref 98–111)
Creatinine: 0.7 mg/dL (ref 0.44–1.00)
GFR, Est AFR Am: >60 mL/min (ref >60)
GFR, Estimated: >60 mL/min (ref >60)
Glucose, Bld: 159 mg/dL — ABNORMAL HIGH (ref 70–99)
Potassium: 3.4 mmol/L — ABNORMAL LOW (ref 3.5–5.1)
Sodium: 138 mmol/L (ref 135–145)
Total Bilirubin: 0.6 mg/dL (ref 0.3–1.2)
Total Protein: 6.7 g/dL (ref 6.5–8.1)

## 2019-03-26 LAB — CBC WITH DIFFERENTIAL (CANCER CENTER ONLY)
Abs Immature Granulocytes: 0.01 10*3/uL (ref 0.00–0.07)
Basophils Absolute: 0 10*3/uL (ref 0.0–0.1)
Basophils Relative: 1 %
Eosinophils Absolute: 0.2 10*3/uL (ref 0.0–0.5)
Eosinophils Relative: 8 %
HCT: 27.9 % — ABNORMAL LOW (ref 36.0–46.0)
Hemoglobin: 9 g/dL — ABNORMAL LOW (ref 12.0–15.0)
Immature Granulocytes: 1 %
Lymphocytes Relative: 32 %
Lymphs Abs: 0.6 10*3/uL — ABNORMAL LOW (ref 0.7–4.0)
MCH: 32.7 pg (ref 26.0–34.0)
MCHC: 32.3 g/dL (ref 30.0–36.0)
MCV: 101.5 fL — ABNORMAL HIGH (ref 80.0–100.0)
Monocytes Absolute: 0.2 10*3/uL (ref 0.1–1.0)
Monocytes Relative: 9 %
Neutro Abs: 0.9 10*3/uL — ABNORMAL LOW (ref 1.7–7.7)
Neutrophils Relative %: 49 %
Platelet Count: 100 10*3/uL — ABNORMAL LOW (ref 150–400)
RBC: 2.75 MIL/uL — ABNORMAL LOW (ref 3.87–5.11)
RDW: 14.5 % (ref 11.5–15.5)
WBC Count: 1.8 10*3/uL — ABNORMAL LOW (ref 4.0–10.5)
nRBC: 0 % (ref 0.0–0.2)

## 2019-03-26 LAB — RETICULOCYTES
Immature Retic Fract: 14.9 % (ref 2.3–15.9)
RBC.: 2.75 MIL/uL — ABNORMAL LOW (ref 3.87–5.11)
Retic Count, Absolute: 66.5 10*3/uL (ref 19.0–186.0)
Retic Ct Pct: 2.4 % (ref 0.4–3.1)

## 2019-03-26 LAB — LACTATE DEHYDROGENASE: LDH: 251 U/L — ABNORMAL HIGH (ref 98–192)

## 2019-03-26 NOTE — Telephone Encounter (Signed)
Appointments scheduled calendar printed per  1/19 los

## 2019-03-26 NOTE — Progress Notes (Signed)
Hematology and Oncology Follow Up Visit  Vicki [name] [id number] May 28, 1960 59 y.o. 03/26/2019   Principle Diagnosis:   TTP - acquired  NASH -- leukopenia/thrombocytopenia  Current Therapy:    Plasma Exchange - M-Th -- changed to this on 03/26/2019     Interim History:  Vicki [name] is back for her first office visit.  I had seen her while she was at [facility].  She was admitted there I think right after Christmas with marked pancytopenia.  She ultimately was found to have TTP.  Her ADAMTS-13 level was less than 5%.  We put a dialysis catheter into her.  We started her on plasma exchanges.  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  This is from the NASH.  As such, our criteria for decreasing the frequency of plasma exchange is a little bit different than regular.  She responded nicely.  Her platelet count started to go up.  Her bilirubin has come back down to normal.  Her LDH, which initially was a 1000 has now come down to less than 200.  Her reticulocyte count also has improved nicely.  She feels well.  She has some swelling in the legs.  She is somewhat anemic.  I will put her on a little diuretic.  She will need some potassium with this.  Again I am so happy that she is improving.  Her quality of life seems to be doing well.  Currently, she is getting plasma exchange every Monday-Wednesday-Friday.  I think we can cut back now to every Monday-Thursday plasma exchanges.  She has had no fever.  There is no cough or shortness of breath.  She has had no diarrhea.  She has had no nausea or vomiting.  I really cannot find an initial event that triggered the TTP.  Overall, her performance status is ECOG 1.  Medications:  Current Outpatient Medications:  .  alum & mag hydroxide-simeth (MAALOX PLUS) 400-400-40 MG/5ML suspension, Take 10 mLs by mouth every 6 (six) hours as needed for indigestion. , Disp: , Rfl:  .  Alum Hydroxide-Mag Carbonate (GAVISCON EXTRA  STRENGTH PO), Take 1 tablet by mouth 4 (four) times daily as needed., Disp: , Rfl:  .  calcium carbonate (TUMS - DOSED IN MG ELEMENTAL CALCIUM) 500 MG chewable tablet, Chew 2 tablets (400 mg of elemental calcium total) by mouth every 3 (three) hours., Disp: 30 tablet, Rfl: 0 .  cholecalciferol (VITAMIN D3) 25 MCG (1000 UT) tablet, Take 1,000 Units by mouth daily., Disp: , Rfl:  .  dextromethorphan-guaiFENesin (MUCINEX DM) 30-600 MG 12hr tablet, Take 2 tablets by mouth 2 (two) times daily as needed for cough., Disp: , Rfl:  .  diphenhydrAMINE (BENADRYL) 50 MG capsule, Take 1 capsule (50 mg total) by mouth daily as needed (on call for plasma exchange)., Disp: 30 capsule, Rfl: 0 .  fluorometholone (FML) 0.1 % ophthalmic suspension, Place 1 drop into the left eye 4 (four) times daily. , Disp: , Rfl:  .  folic acid (FOLVITE) 1 MG tablet, Take 2 tablets (2 mg total) by mouth daily., Disp: 60 tablet, Rfl: 0 .  glucose blood test strip, One touch ultra, Disp: , Rfl:  .  Multiple Vitamins-Minerals (MULTI FOR HER 50+) TABS, Take 1 tablet by mouth daily., Disp: , Rfl:  .  senna-docusate (SENOKOT-S) 8.6-50 MG tablet, Take 2 tablets by mouth 2 (two) times daily., Disp: 20 tablet, Rfl: 0 .  traMADol (ULTRAM) 50 MG tablet, Take 1 tablet (50 mg total) by  mouth every 6 (six) hours as needed., Disp: 20 tablet, Rfl: 0  Allergies:  Allergies  Allergen Reactions  . Metformin And Related Itching  . Sulfa Antibiotics Itching  . Sulfasalazine Itching    Past Medical History, Surgical history, Social history, and Family History were reviewed and updated.  Review of Systems: Review of Systems  Constitutional: Negative.   HENT:  Negative.   Eyes: Negative.   Respiratory: Negative.   Cardiovascular: Positive for leg swelling.  Gastrointestinal: Negative.   Endocrine: Negative.   Genitourinary: Negative.    Musculoskeletal: Positive for arthralgias and myalgias.  Skin: Negative.   Neurological: Negative.    Hematological: Negative.   Psychiatric/Behavioral: Negative.     Physical Exam:  height is 5' 2"  (1.575 m) and weight is 153 lb 1.3 oz (69.4 kg). Her temporal temperature is 98.8 F (37.1 C). Her blood pressure is 148/74 (abnormal) and her pulse is 90. Her respiration is 18 and oxygen saturation is 100%.   Wt Readings from Last 3 Encounters:  03/26/19 153 lb 1.3 oz (69.4 kg)  03/15/19 148 lb 3.2 oz (67.2 kg)  02/26/19 151 lb (68.5 kg)    Physical Exam Vitals reviewed.  HENT:     Head: Normocephalic and atraumatic.  Eyes:     Pupils: Pupils are equal, round, and reactive to light.  Cardiovascular:     Rate and Rhythm: Normal rate and regular rhythm.     Heart sounds: Normal heart sounds.  Pulmonary:     Effort: Pulmonary effort is normal.     Breath sounds: Normal breath sounds.  Abdominal:     General: Bowel sounds are normal.     Palpations: Abdomen is soft.  Musculoskeletal:        General: No tenderness or deformity. Normal range of motion.     Cervical back: Normal range of motion.     Comments: Extremities shows some 1+ edema in her lower legs.  She has good range of motion of her joints.  She has good strength in upper and lower extremities.  Lymphadenopathy:     Cervical: No cervical adenopathy.  Skin:    General: Skin is warm and dry.     Findings: No erythema or rash.  Neurological:     Mental Status: She is alert and oriented to person, place, and time.  Psychiatric:        Behavior: Behavior normal.        Thought Content: Thought content normal.        Judgment: Judgment normal.      Lab Results  Component Value Date   WBC 1.8 (L) 03/26/2019   HGB 9.0 (L) 03/26/2019   HCT 27.9 (L) 03/26/2019   MCV 101.5 (H) 03/26/2019   PLT 100 (L) 03/26/2019     Chemistry      Component Value Date/Time   NA 138 03/26/2019 1225   K 3.4 (L) 03/26/2019 1225   CL 107 03/26/2019 1225   CO2 26 03/26/2019 1225   BUN 6 03/26/2019 1225   CREATININE 0.70 03/26/2019  1225   CREATININE 0.70 09/25/2015 1504      Component Value Date/Time   CALCIUM 9.2 03/26/2019 1225   ALKPHOS 86 03/26/2019 1225   AST 50 (H) 03/26/2019 1225   ALT 25 03/26/2019 1225   BILITOT 0.6 03/26/2019 1225       Impression and Plan: Vicki [name] is a 59 year old African-American female.  She has acquired TTP.  There will be very informative to  see what her ADAMTS-13 is now.  I would like to hope that it is over 20%.  By her blood smear, I really cannot see any schistocytes now.  I have to believe that the TTP is responding and that she is in remission.  By the fact that her LDH has normalized should be quite helpful.  Thankfully, we were able to get to her TTP fairly quickly.  By the efforts of many people over it [facility], we are able to get her plasma exchange started in a day.  This only could have been done by the coordinated efforts of radiology and the dialysis unit.  Again, we will try to decrease her plasma exchange frequency to Monday and Thursday.  We will call the dialysis unit to let them know about the change in days.  I would like to see her back in a couple weeks.  Hopefully if everything is stable, we can stop the plasma exchange.  I spent about 35 minutes with her today.  It was her first office visit.  I wanted to review all of her lab work with her.  I answered all of her questions.   Volanda Napoleon, MD 1/19/20211:19 PM

## 2019-03-27 ENCOUNTER — Other Ambulatory Visit: Payer: Self-pay | Admitting: *Deleted

## 2019-03-27 MED ORDER — POTASSIUM CHLORIDE ER 10 MEQ PO TBCR: 10.0000 meq | EXTENDED_RELEASE_TABLET | Freq: Every day | ORAL | 1 refills | Status: DC

## 2019-03-27 MED ORDER — FUROSEMIDE 40 MG PO TABS: 40.0000 mg | ORAL_TABLET | Freq: Every day | ORAL | 1 refills | Status: DC

## 2019-03-28 ENCOUNTER — Non-Acute Institutional Stay (HOSPITAL_COMMUNITY)
Admission: AD | Admit: 2019-03-28 | Discharge: 2019-03-28 | Disposition: A | Discharge status: Home / Self Care | Payer: Federal, State, Local not specified - PPO | Source: Ambulatory Visit | Attending: Hematology & Oncology | Admitting: Hematology & Oncology

## 2019-03-28 DIAGNOSIS — M311 Thrombotic microangiopathy: Secondary | ICD-10-CM | POA: Diagnosis not present

## 2019-03-28 LAB — CBC
HCT: 27.3 % — ABNORMAL LOW (ref 36.0–46.0)
Hemoglobin: 8.8 g/dL — ABNORMAL LOW (ref 12.0–15.0)
MCH: 33.2 pg (ref 26.0–34.0)
MCHC: 32.2 g/dL (ref 30.0–36.0)
MCV: 103 fL — ABNORMAL HIGH (ref 80.0–100.0)
Platelets: 100 10*3/uL — ABNORMAL LOW (ref 150–400)
RBC: 2.65 MIL/uL — ABNORMAL LOW (ref 3.87–5.11)
RDW: 14.2 % (ref 11.5–15.5)
WBC: 1.4 10*3/uL — CL (ref 4.0–10.5)
nRBC: 0 % (ref 0.0–0.2)

## 2019-03-28 LAB — BASIC METABOLIC PANEL
Anion gap: 10 (ref 5–15)
BUN: 5 mg/dL — ABNORMAL LOW (ref 6–20)
CO2: 24 mmol/L (ref 22–32)
Calcium: 8.8 mg/dL — ABNORMAL LOW (ref 8.9–10.3)
Chloride: 105 mmol/L (ref 98–111)
Creatinine, Ser: 0.71 mg/dL (ref 0.44–1.00)
GFR calc Af Amer: >60 mL/min (ref >60)
GFR calc non Af Amer: >60 mL/min (ref >60)
Glucose, Bld: 212 mg/dL — ABNORMAL HIGH (ref 70–99)
Potassium: 3.8 mmol/L (ref 3.5–5.1)
Sodium: 139 mmol/L (ref 135–145)

## 2019-03-28 LAB — ADAMTS13 ACTIVITY: Adamts 13 Activity: 35.4 % — ABNORMAL LOW (ref >66.8)

## 2019-03-28 LAB — ADAMTS13 ACTIVITY REFLEX

## 2019-03-28 MED ORDER — ACD FORMULA A 0.73-2.45-2.2 GM/100ML VI SOLN: 500.0000 mL | Status: DC

## 2019-03-28 MED ORDER — DIPHENHYDRAMINE HCL 25 MG PO CAPS: 25.0000 mg | ORAL_CAPSULE | Freq: Four times a day (QID) | ORAL | Status: DC | PRN

## 2019-03-28 MED ORDER — SODIUM CHLORIDE 0.9 % IV SOLN
2.0000 g | Freq: Once | INTRAVENOUS | Status: AC
  Administered 2019-03-28: 2 g via INTRAVENOUS
  Filled 2019-03-28: qty 20

## 2019-03-28 MED ORDER — DIPHENHYDRAMINE HCL 25 MG PO CAPS
ORAL_CAPSULE | ORAL | Status: AC
  Administered 2019-03-28: 25 mg via ORAL
  Filled 2019-03-28: qty 1

## 2019-03-28 MED ORDER — ANTICOAGULANT SODIUM CITRATE 4% (200MG/5ML) IV SOLN
5.0000 mL | Freq: Once | Status: DC
  Filled 2019-03-28: qty 5

## 2019-03-28 MED ORDER — CALCIUM CARBONATE ANTACID 500 MG PO CHEW: 2.0000 | CHEWABLE_TABLET | ORAL | Status: DC

## 2019-03-28 MED ORDER — ACETAMINOPHEN 325 MG PO TABS: 650.0000 mg | ORAL_TABLET | ORAL | Status: DC | PRN

## 2019-03-28 MED ORDER — CALCIUM CARBONATE ANTACID 500 MG PO CHEW
CHEWABLE_TABLET | ORAL | Status: AC
  Administered 2019-03-28: 400 mg via ORAL
  Filled 2019-03-28: qty 2

## 2019-03-28 MED ORDER — ACD FORMULA A 0.73-2.45-2.2 GM/100ML VI SOLN
Status: AC
  Filled 2019-03-28: qty 500

## 2019-03-28 NOTE — Progress Notes (Signed)
TPE tx completed without complications; well tolerated; denis pain, dizziness, n/v, weakness or paresthesia; pt accompanied to the main entrace on a wheelchair to her awaiting ride.

## 2019-03-29 ENCOUNTER — Telehealth: Payer: Self-pay | Admitting: *Deleted

## 2019-03-29 LAB — THERAPEUTIC PLASMA EXCHANGE (BLOOD BANK)
Plasma Exchange: 2522
Plasma volume needed: 2522
Unit division: 0
Unit division: 0
Unit division: 0
Unit division: 0
Unit division: 0
Unit division: 0
Unit division: 0
Unit division: 0

## 2019-03-29 NOTE — Telephone Encounter (Signed)
Message received from patient wanting to know if it is ok if she takes [place].  Call placed back to patient and message left to notify patient per order of Dr. Marin Olp that it is ok for her to take [place] as needed.  Instructed pt to call office back with any further questions or concerns.

## 2019-04-01 ENCOUNTER — Non-Acute Institutional Stay (HOSPITAL_COMMUNITY)
Admission: RE | Admit: 2019-04-01 | Discharge: 2019-04-01 | Disposition: A | Discharge status: Home / Self Care | Payer: Federal, State, Local not specified - PPO | Source: Ambulatory Visit | Attending: Hematology & Oncology | Admitting: Hematology & Oncology

## 2019-04-01 DIAGNOSIS — M311 Thrombotic microangiopathy: Secondary | ICD-10-CM | POA: Diagnosis not present

## 2019-04-01 LAB — COMPREHENSIVE METABOLIC PANEL
ALT: 26 U/L (ref 0–44)
AST: 58 U/L — ABNORMAL HIGH (ref 15–41)
Albumin: 3.3 g/dL — ABNORMAL LOW (ref 3.5–5.0)
Alkaline Phosphatase: 83 U/L (ref 38–126)
Anion gap: 10 (ref 5–15)
BUN: <5 mg/dL — ABNORMAL LOW (ref 6–20)
CO2: 23 mmol/L (ref 22–32)
Calcium: 8.9 mg/dL (ref 8.9–10.3)
Chloride: 105 mmol/L (ref 98–111)
Creatinine, Ser: 0.73 mg/dL (ref 0.44–1.00)
GFR calc Af Amer: >60 mL/min (ref >60)
GFR calc non Af Amer: >60 mL/min (ref >60)
Glucose, Bld: 242 mg/dL — ABNORMAL HIGH (ref 70–99)
Potassium: 3.6 mmol/L (ref 3.5–5.1)
Sodium: 138 mmol/L (ref 135–145)
Total Bilirubin: 1.2 mg/dL (ref 0.3–1.2)
Total Protein: 6.2 g/dL — ABNORMAL LOW (ref 6.5–8.1)

## 2019-04-01 LAB — CBC
HCT: 36.2 % (ref 36.0–46.0)
Hemoglobin: 11.8 g/dL — ABNORMAL LOW (ref 12.0–15.0)
MCH: 33 pg (ref 26.0–34.0)
MCHC: 32.6 g/dL (ref 30.0–36.0)
MCV: 101.1 fL — ABNORMAL HIGH (ref 80.0–100.0)
Platelets: 67 10*3/uL — ABNORMAL LOW (ref 150–400)
RBC: 3.58 MIL/uL — ABNORMAL LOW (ref 3.87–5.11)
RDW: 13.5 % (ref 11.5–15.5)
WBC: 1.1 10*3/uL — CL (ref 4.0–10.5)
nRBC: 0 % (ref 0.0–0.2)

## 2019-04-01 LAB — RETICULOCYTES
Immature Retic Fract: 19 % — ABNORMAL HIGH (ref 2.3–15.9)
RBC.: 2.89 MIL/uL — ABNORMAL LOW (ref 3.87–5.11)
Retic Count, Absolute: 67 10*3/uL (ref 19.0–186.0)
Retic Ct Pct: 2.3 % (ref 0.4–3.1)

## 2019-04-01 LAB — LACTATE DEHYDROGENASE: LDH: 340 U/L — ABNORMAL HIGH (ref 98–192)

## 2019-04-01 MED ORDER — ACETAMINOPHEN 325 MG PO TABS: 650.0000 mg | ORAL_TABLET | ORAL | Status: DC | PRN

## 2019-04-01 MED ORDER — DIPHENHYDRAMINE HCL 25 MG PO CAPS
ORAL_CAPSULE | ORAL | Status: AC
  Filled 2019-04-01: qty 1

## 2019-04-01 MED ORDER — ACD FORMULA A 0.73-2.45-2.2 GM/100ML VI SOLN
500.0000 mL | Status: DC
  Administered 2019-04-01: 500 mL via INTRAVENOUS

## 2019-04-01 MED ORDER — CALCIUM CARBONATE ANTACID 500 MG PO CHEW
2.0000 | CHEWABLE_TABLET | ORAL | Status: AC
  Administered 2019-04-01 (×2): 400 mg via ORAL

## 2019-04-01 MED ORDER — ANTICOAGULANT SODIUM CITRATE 4% (200MG/5ML) IV SOLN
5.0000 mL | Freq: Once | Status: AC
  Administered 2019-04-01: 5 mL
  Filled 2019-04-01: qty 5

## 2019-04-01 MED ORDER — ACD FORMULA A 0.73-2.45-2.2 GM/100ML VI SOLN
Status: AC
  Filled 2019-04-01: qty 1000

## 2019-04-01 MED ORDER — DIPHENHYDRAMINE HCL 25 MG PO CAPS
25.0000 mg | ORAL_CAPSULE | Freq: Four times a day (QID) | ORAL | Status: DC | PRN
  Administered 2019-04-01: 25 mg via ORAL

## 2019-04-01 MED ORDER — CALCIUM CARBONATE ANTACID 500 MG PO CHEW
CHEWABLE_TABLET | ORAL | Status: AC
  Filled 2019-04-01: qty 2

## 2019-04-01 MED ORDER — SODIUM CHLORIDE 0.9 % IV SOLN
2.0000 g | Freq: Once | INTRAVENOUS | Status: AC
  Administered 2019-04-01: 2 g via INTRAVENOUS
  Filled 2019-04-01: qty 20

## 2019-04-01 NOTE — Progress Notes (Signed)
TPE tx completed with no adverse events noted. Denies any pain/discomfort at this time, Pt in no apparent distress. No complaints voiced. D/C home as ordered, Pt alert/oriented, ambulatory and accompanied pt  By the [employer]	[place] main entrance for her ride back home.

## 2019-04-02 LAB — THERAPEUTIC PLASMA EXCHANGE (BLOOD BANK)
Plasma Exchange: 2522
Plasma volume needed: 2522
Unit division: 0
Unit division: 0
Unit division: 0
Unit division: 0
Unit division: 0
Unit division: 0
Unit division: 0
Unit division: 0

## 2019-04-04 ENCOUNTER — Non-Acute Institutional Stay (HOSPITAL_COMMUNITY)
Admission: AD | Admit: 2019-04-04 | Discharge: 2019-04-08 | Disposition: A | Discharge status: Home / Self Care | Payer: Federal, State, Local not specified - PPO | Source: Ambulatory Visit | Attending: Hematology & Oncology | Admitting: Hematology & Oncology

## 2019-04-04 DIAGNOSIS — M311 Thrombotic microangiopathy: Secondary | ICD-10-CM | POA: Diagnosis not present

## 2019-04-04 LAB — RETICULOCYTES
Immature Retic Fract: 17.6 % — ABNORMAL HIGH (ref 2.3–15.9)
RBC.: 2.75 MIL/uL — ABNORMAL LOW (ref 3.87–5.11)
Retic Count, Absolute: 75.4 10*3/uL (ref 19.0–186.0)
Retic Ct Pct: 2.7 % (ref 0.4–3.1)

## 2019-04-04 LAB — BASIC METABOLIC PANEL
Anion gap: 7 (ref 5–15)
BUN: 5 mg/dL — ABNORMAL LOW (ref 6–20)
CO2: 24 mmol/L (ref 22–32)
Calcium: 8.8 mg/dL — ABNORMAL LOW (ref 8.9–10.3)
Chloride: 107 mmol/L (ref 98–111)
Creatinine, Ser: 0.58 mg/dL (ref 0.44–1.00)
GFR calc Af Amer: >60 mL/min (ref >60)
GFR calc non Af Amer: >60 mL/min (ref >60)
Glucose, Bld: 243 mg/dL — ABNORMAL HIGH (ref 70–99)
Potassium: 3.8 mmol/L (ref 3.5–5.1)
Sodium: 138 mmol/L (ref 135–145)

## 2019-04-04 LAB — LACTATE DEHYDROGENASE: LDH: 319 U/L — ABNORMAL HIGH (ref 98–192)

## 2019-04-04 MED ORDER — SODIUM CHLORIDE 0.9 % IV SOLN: 2.0000 g | Freq: Once | INTRAVENOUS | Status: DC

## 2019-04-04 MED ORDER — ACETAMINOPHEN 325 MG PO TABS
650.0000 mg | ORAL_TABLET | ORAL | Status: DC | PRN
  Administered 2019-04-04: 650 mg via ORAL

## 2019-04-04 MED ORDER — ACD FORMULA A 0.73-2.45-2.2 GM/100ML VI SOLN
Status: AC
  Filled 2019-04-04: qty 500

## 2019-04-04 MED ORDER — ACD FORMULA A 0.73-2.45-2.2 GM/100ML VI SOLN: 500.0000 mL | Status: DC

## 2019-04-04 MED ORDER — CALCIUM CARBONATE ANTACID 500 MG PO CHEW
CHEWABLE_TABLET | ORAL | Status: AC
  Administered 2019-04-04: 400 mg via ORAL
  Filled 2019-04-04: qty 4

## 2019-04-04 MED ORDER — DIPHENHYDRAMINE HCL 25 MG PO CAPS
ORAL_CAPSULE | ORAL | Status: AC
  Administered 2019-04-04: 25 mg via ORAL
  Filled 2019-04-04: qty 1

## 2019-04-04 MED ORDER — DIPHENHYDRAMINE HCL 25 MG PO CAPS: 25.0000 mg | ORAL_CAPSULE | Freq: Four times a day (QID) | ORAL | Status: DC | PRN

## 2019-04-04 MED ORDER — CALCIUM CARBONATE ANTACID 500 MG PO CHEW
2.0000 | CHEWABLE_TABLET | ORAL | Status: AC
  Administered 2019-04-04: 400 mg via ORAL

## 2019-04-04 MED ORDER — CALCIUM GLUCONATE-NACL 2-0.675 GM/100ML-% IV SOLN
2.0000 g | Freq: Once | INTRAVENOUS | Status: AC
  Administered 2019-04-04: 2000 mg via INTRAVENOUS
  Filled 2019-04-04: qty 100

## 2019-04-04 MED ORDER — ACETAMINOPHEN 325 MG PO TABS
ORAL_TABLET | ORAL | Status: AC
  Filled 2019-04-04: qty 2

## 2019-04-04 MED ORDER — ANTICOAGULANT SODIUM CITRATE 4% (200MG/5ML) IV SOLN
5.0000 mL | Freq: Once | Status: AC
  Administered 2019-04-04: 5 mL
  Filled 2019-04-04: qty 5

## 2019-04-05 LAB — THERAPEUTIC PLASMA EXCHANGE (BLOOD BANK)
Plasma Exchange: 2522
Plasma volume needed: 2522
Unit division: 0
Unit division: 0
Unit division: 0
Unit division: 0
Unit division: 0
Unit division: 0
Unit division: 0
Unit division: 0

## 2019-04-08 DIAGNOSIS — M311 Thrombotic microangiopathy: Secondary | ICD-10-CM | POA: Diagnosis not present

## 2019-04-08 LAB — LACTATE DEHYDROGENASE: LDH: 324 U/L — ABNORMAL HIGH (ref 98–192)

## 2019-04-08 LAB — CBC
HCT: 30 % — ABNORMAL LOW (ref 36.0–46.0)
Hemoglobin: 9.7 g/dL — ABNORMAL LOW (ref 12.0–15.0)
MCH: 33.1 pg (ref 26.0–34.0)
MCHC: 32.3 g/dL (ref 30.0–36.0)
MCV: 102.4 fL — ABNORMAL HIGH (ref 80.0–100.0)
Platelets: 69 10*3/uL — ABNORMAL LOW (ref 150–400)
RBC: 2.93 MIL/uL — ABNORMAL LOW (ref 3.87–5.11)
RDW: 13 % (ref 11.5–15.5)
WBC: 1.8 10*3/uL — ABNORMAL LOW (ref 4.0–10.5)
nRBC: 0 % (ref 0.0–0.2)

## 2019-04-08 LAB — BASIC METABOLIC PANEL
Anion gap: 8 (ref 5–15)
BUN: 5 mg/dL — ABNORMAL LOW (ref 6–20)
CO2: 24 mmol/L (ref 22–32)
Calcium: 9.3 mg/dL (ref 8.9–10.3)
Chloride: 107 mmol/L (ref 98–111)
Creatinine, Ser: 0.69 mg/dL (ref 0.44–1.00)
GFR calc Af Amer: >60 mL/min (ref >60)
GFR calc non Af Amer: >60 mL/min (ref >60)
Glucose, Bld: 240 mg/dL — ABNORMAL HIGH (ref 70–99)
Potassium: 3.9 mmol/L (ref 3.5–5.1)
Sodium: 139 mmol/L (ref 135–145)

## 2019-04-08 LAB — RETICULOCYTES
Immature Retic Fract: 15.9 % (ref 2.3–15.9)
RBC.: 2.93 MIL/uL — ABNORMAL LOW (ref 3.87–5.11)
Retic Count, Absolute: 69.7 10*3/uL (ref 19.0–186.0)
Retic Ct Pct: 2.4 % (ref 0.4–3.1)

## 2019-04-08 MED ORDER — DIPHENHYDRAMINE HCL 25 MG PO CAPS: 25.0000 mg | ORAL_CAPSULE | Freq: Four times a day (QID) | ORAL | Status: DC | PRN

## 2019-04-08 MED ORDER — ACD FORMULA A 0.73-2.45-2.2 GM/100ML VI SOLN
Status: AC
  Filled 2019-04-08: qty 500

## 2019-04-08 MED ORDER — ACETAMINOPHEN 325 MG PO TABS: 650.0000 mg | ORAL_TABLET | ORAL | Status: DC | PRN

## 2019-04-08 MED ORDER — SODIUM CHLORIDE 0.9 % IV SOLN: 2.0000 g | Freq: Once | INTRAVENOUS | Status: DC

## 2019-04-08 MED ORDER — CALCIUM GLUCONATE-NACL 2-0.675 GM/100ML-% IV SOLN
2.0000 g | Freq: Once | INTRAVENOUS | Status: AC
  Administered 2019-04-08: 2000 mg via INTRAVENOUS
  Filled 2019-04-08: qty 100

## 2019-04-08 MED ORDER — ACETAMINOPHEN 325 MG PO TABS
ORAL_TABLET | ORAL | Status: AC
  Filled 2019-04-08: qty 2

## 2019-04-08 MED ORDER — CALCIUM CARBONATE ANTACID 500 MG PO CHEW
CHEWABLE_TABLET | ORAL | Status: AC
  Administered 2019-04-08: 400 mg via ORAL
  Filled 2019-04-08: qty 2

## 2019-04-08 MED ORDER — DIPHENHYDRAMINE HCL 25 MG PO CAPS
ORAL_CAPSULE | ORAL | Status: AC
  Administered 2019-04-08: 25 mg via ORAL
  Filled 2019-04-08: qty 1

## 2019-04-08 MED ORDER — ACD FORMULA A 0.73-2.45-2.2 GM/100ML VI SOLN: 500.0000 mL | Status: DC

## 2019-04-08 MED ORDER — CALCIUM CARBONATE ANTACID 500 MG PO CHEW: 2.0000 | CHEWABLE_TABLET | ORAL | Status: AC

## 2019-04-08 MED ORDER — ANTICOAGULANT SODIUM CITRATE 4% (200MG/5ML) IV SOLN
5.0000 mL | Freq: Once | Status: DC
  Filled 2019-04-08: qty 5

## 2019-04-08 NOTE — Progress Notes (Signed)
Treatment complete goal met tolerated well. Patient departed unit alert oriented vitals stable w/o complaint via wheelchair to a waiting car outside [employer] entrance.

## 2019-04-09 ENCOUNTER — Inpatient Hospital Stay (HOSPITAL_BASED_OUTPATIENT_CLINIC_OR_DEPARTMENT_OTHER): Payer: Federal, State, Local not specified - PPO | Admitting: Hematology & Oncology

## 2019-04-09 ENCOUNTER — Other Ambulatory Visit: Payer: Self-pay

## 2019-04-09 ENCOUNTER — Encounter: Payer: Self-pay | Admitting: Hematology & Oncology

## 2019-04-09 ENCOUNTER — Telehealth: Payer: Self-pay | Admitting: Hematology & Oncology

## 2019-04-09 ENCOUNTER — Inpatient Hospital Stay: Payer: Federal, State, Local not specified - PPO | Attending: Hematology

## 2019-04-09 VITALS — BP 139/57 | HR 87 | Temp 97.7°F | Resp 18 | Wt 149.8 lb

## 2019-04-09 DIAGNOSIS — M7989 Other specified soft tissue disorders: Secondary | ICD-10-CM | POA: Diagnosis not present

## 2019-04-09 DIAGNOSIS — Z7984 Long term (current) use of oral hypoglycemic drugs: Secondary | ICD-10-CM | POA: Insufficient documentation

## 2019-04-09 DIAGNOSIS — E119 Type 2 diabetes mellitus without complications: Secondary | ICD-10-CM | POA: Diagnosis not present

## 2019-04-09 DIAGNOSIS — Z79899 Other long term (current) drug therapy: Secondary | ICD-10-CM | POA: Insufficient documentation

## 2019-04-09 DIAGNOSIS — K7460 Unspecified cirrhosis of liver: Secondary | ICD-10-CM | POA: Diagnosis not present

## 2019-04-09 DIAGNOSIS — R531 Weakness: Secondary | ICD-10-CM | POA: Diagnosis not present

## 2019-04-09 DIAGNOSIS — D72819 Decreased white blood cell count, unspecified: Secondary | ICD-10-CM | POA: Insufficient documentation

## 2019-04-09 DIAGNOSIS — M3119 Other thrombotic microangiopathy: Secondary | ICD-10-CM

## 2019-04-09 DIAGNOSIS — M311 Thrombotic microangiopathy: Secondary | ICD-10-CM

## 2019-04-09 DIAGNOSIS — R5383 Other fatigue: Secondary | ICD-10-CM | POA: Insufficient documentation

## 2019-04-09 LAB — THERAPEUTIC PLASMA EXCHANGE (BLOOD BANK)
Plasma Exchange: 2522
Plasma volume needed: 2522
Unit division: 0
Unit division: 0
Unit division: 0
Unit division: 0
Unit division: 0
Unit division: 0
Unit division: 0
Unit division: 0

## 2019-04-09 LAB — CBC WITH DIFFERENTIAL (CANCER CENTER ONLY)
Abs Immature Granulocytes: 0.02 10*3/uL (ref 0.00–0.07)
Basophils Absolute: 0 10*3/uL (ref 0.0–0.1)
Basophils Relative: 1 %
Eosinophils Absolute: 0.3 10*3/uL (ref 0.0–0.5)
Eosinophils Relative: 12 %
HCT: 31.7 % — ABNORMAL LOW (ref 36.0–46.0)
Hemoglobin: 10.3 g/dL — ABNORMAL LOW (ref 12.0–15.0)
Immature Granulocytes: 1 %
Lymphocytes Relative: 27 %
Lymphs Abs: 0.6 10*3/uL — ABNORMAL LOW (ref 0.7–4.0)
MCH: 32.5 pg (ref 26.0–34.0)
MCHC: 32.5 g/dL (ref 30.0–36.0)
MCV: 100 fL (ref 80.0–100.0)
Monocytes Absolute: 0.2 10*3/uL (ref 0.1–1.0)
Monocytes Relative: 8 %
Neutro Abs: 1.1 10*3/uL — ABNORMAL LOW (ref 1.7–7.7)
Neutrophils Relative %: 51 %
Platelet Count: 73 10*3/uL — ABNORMAL LOW (ref 150–400)
RBC: 3.17 MIL/uL — ABNORMAL LOW (ref 3.87–5.11)
RDW: 12.7 % (ref 11.5–15.5)
WBC Count: 2.1 10*3/uL — ABNORMAL LOW (ref 4.0–10.5)
nRBC: 0 % (ref 0.0–0.2)

## 2019-04-09 LAB — CMP (CANCER CENTER ONLY)
ALT: 22 U/L (ref 0–44)
AST: 41 U/L (ref 15–41)
Albumin: 3.9 g/dL (ref 3.5–5.0)
Alkaline Phosphatase: 88 U/L (ref 38–126)
Anion gap: 6 (ref 5–15)
BUN: 5 mg/dL — ABNORMAL LOW (ref 6–20)
CO2: 29 mmol/L (ref 22–32)
Calcium: 9.6 mg/dL (ref 8.9–10.3)
Chloride: 107 mmol/L (ref 98–111)
Creatinine: 0.66 mg/dL (ref 0.44–1.00)
GFR, Est AFR Am: >60 mL/min (ref >60)
GFR, Estimated: >60 mL/min (ref >60)
Glucose, Bld: 183 mg/dL — ABNORMAL HIGH (ref 70–99)
Potassium: 4 mmol/L (ref 3.5–5.1)
Sodium: 142 mmol/L (ref 135–145)
Total Bilirubin: 0.8 mg/dL (ref 0.3–1.2)
Total Protein: 6.6 g/dL (ref 6.5–8.1)

## 2019-04-09 LAB — RETICULOCYTES
Immature Retic Fract: 12.3 % (ref 2.3–15.9)
RBC.: 3.18 MIL/uL — ABNORMAL LOW (ref 3.87–5.11)
Retic Count, Absolute: 74.4 10*3/uL (ref 19.0–186.0)
Retic Ct Pct: 2.3 % (ref 0.4–3.1)

## 2019-04-09 LAB — IRON AND TIBC
Iron: 102 ug/dL (ref 41–142)
Saturation Ratios: 35 % (ref 21–57)
TIBC: 289 ug/dL (ref 236–444)
UIBC: 187 ug/dL (ref 120–384)

## 2019-04-09 LAB — FERRITIN: Ferritin: 342 ng/mL — ABNORMAL HIGH (ref 11–307)

## 2019-04-09 LAB — LACTATE DEHYDROGENASE: LDH: 319 U/L — ABNORMAL HIGH (ref 98–192)

## 2019-04-09 LAB — SAVE SMEAR(SSMR), FOR PROVIDER SLIDE REVIEW

## 2019-04-09 MED ORDER — FOLIC ACID 1 MG PO TABS: 2.0000 mg | ORAL_TABLET | Freq: Every day | ORAL | 12 refills | Status: DC

## 2019-04-09 NOTE — Telephone Encounter (Signed)
Appointments scheduled and calendar was printed per 2/2 los

## 2019-04-09 NOTE — Progress Notes (Signed)
Hematology and Oncology Follow Up Visit  Vicki [name] [id number] Aug 20, 1960 58 y.o. 04/09/2019   Principle Diagnosis:   TTP - acquired  NASH -- leukopenia/thrombocytopenia  Current Therapy:    Plasma Exchange - M-Th -- changed to this on 03/26/2019 -- d/c on 04/09/2019     Interim History:  Vicki [name] is back for her follow-up.  She is doing well.  She feels good.  She really has had no problems with respect to the plasma exchange.  We last saw her, her  ADAMTS-13 level was 35%.  This is so much better than the initial value of less than 5% when we started the plasma exchange.  She has had no problem with bleeding.  She has had no fever.  She has had no headache.  She has had no rashes.  We will now go ahead and stop her plasma exchange.  She was given plasma exchange twice a week.  I think with her labs, we can stop plasma exchange and see how her blood counts respond.  She has cirrhosis.  Because of the cirrhosis, she will have leukopenia and thrombocytopenia chronically.  I think the fact that her reticulocyte count is less than 2% when corrected, is a good sign that she is not hemolyzing.  I looked at her blood smear today.  The blood smear showed very few schistocytes.  She has had no cough.  There has been no chest wall pain.  There has been no problems with bowels or bladder.  She has occasional swelling in her ankles.  Overall, her performance status is ECOG 1.   Medications:  Current Outpatient Medications:  .  Alum Hydroxide-Mag Carbonate (GAVISCON EXTRA STRENGTH PO), Take 1 tablet by mouth 4 (four) times daily as needed., Disp: , Rfl:  .  calcium carbonate (TUMS - DOSED IN MG ELEMENTAL CALCIUM) 500 MG chewable tablet, Chew 2 tablets (400 mg of elemental calcium total) by mouth every 3 (three) hours., Disp: 30 tablet, Rfl: 0 .  cholecalciferol (VITAMIN D3) 25 MCG (1000 UT) tablet, Take 1,000 Units by mouth daily., Disp: , Rfl:  .  dextromethorphan-guaiFENesin  (MUCINEX DM) 30-600 MG 12hr tablet, Take 2 tablets by mouth 2 (two) times daily as needed for cough., Disp: , Rfl:  .  diphenhydrAMINE (BENADRYL) 50 MG capsule, Take 1 capsule (50 mg total) by mouth daily as needed (on call for plasma exchange)., Disp: 30 capsule, Rfl: 0 .  Fexofenadine-Pseudoephedrine (ALLEGRA-D 24 HOUR PO), Take by mouth., Disp: , Rfl:  .  fluorometholone (FML) 0.1 % ophthalmic suspension, Place 1 drop into the left eye 4 (four) times daily. , Disp: , Rfl:  .  folic acid (FOLVITE) 1 MG tablet, Take 2 tablets (2 mg total) by mouth daily., Disp: 60 tablet, Rfl: 0 .  furosemide (LASIX) 40 MG tablet, Take 1 tablet (40 mg total) by mouth daily., Disp: 30 tablet, Rfl: 1 .  glucose blood test strip, One touch ultra, Disp: , Rfl:  .  Multiple Vitamins-Minerals (MULTI FOR HER 50+) TABS, Take 1 tablet by mouth daily., Disp: , Rfl:  .  potassium chloride (KLOR-CON) 10 MEQ tablet, Take 1 tablet (10 mEq total) by mouth daily., Disp: 30 tablet, Rfl: 1 .  senna-docusate (SENOKOT-S) 8.6-50 MG tablet, Take 2 tablets by mouth 2 (two) times daily., Disp: 20 tablet, Rfl: 0 .  traMADol (ULTRAM) 50 MG tablet, Take 1 tablet (50 mg total) by mouth every 6 (six) hours as needed., Disp: 20 tablet, Rfl: 0 .  alum & mag hydroxide-simeth (MAALOX PLUS) 400-400-40 MG/5ML suspension, Take 10 mLs by mouth every 6 (six) hours as needed for indigestion. , Disp: , Rfl:   Allergies:  Allergies  Allergen Reactions  . Metformin And Related Itching  . Sulfa Antibiotics Itching  . Sulfasalazine Itching    Past Medical History, Surgical history, Social history, and Family History were reviewed and updated.  Review of Systems: Review of Systems  Constitutional: Negative.   HENT:  Negative.   Eyes: Negative.   Respiratory: Negative.   Cardiovascular: Positive for leg swelling.  Gastrointestinal: Negative.   Endocrine: Negative.   Genitourinary: Negative.    Musculoskeletal: Positive for arthralgias and  myalgias.  Skin: Negative.   Neurological: Negative.   Hematological: Negative.   Psychiatric/Behavioral: Negative.     Physical Exam:  weight is 149 lb 12 oz (67.9 kg). Her temporal temperature is 97.7 F (36.5 C). Her blood pressure is 139/57 (abnormal) and her pulse is 87. Her respiration is 18 and oxygen saturation is 100%.   Wt Readings from Last 3 Encounters:  04/09/19 149 lb 12 oz (67.9 kg)  04/08/19 152 lb 1.9 oz (69 kg)  03/26/19 153 lb 1.3 oz (69.4 kg)    Physical Exam Vitals reviewed.  HENT:     Head: Normocephalic and atraumatic.  Eyes:     Pupils: Pupils are equal, round, and reactive to light.  Cardiovascular:     Rate and Rhythm: Normal rate and regular rhythm.     Heart sounds: Normal heart sounds.  Pulmonary:     Effort: Pulmonary effort is normal.     Breath sounds: Normal breath sounds.  Abdominal:     General: Bowel sounds are normal.     Palpations: Abdomen is soft.  Musculoskeletal:        General: No tenderness or deformity. Normal range of motion.     Cervical back: Normal range of motion.     Comments: Extremities shows some 1+ edema in her lower legs.  She has good range of motion of her joints.  She has good strength in upper and lower extremities.  Lymphadenopathy:     Cervical: No cervical adenopathy.  Skin:    General: Skin is warm and dry.     Findings: No erythema or rash.  Neurological:     Mental Status: She is alert and oriented to person, place, and time.  Psychiatric:        Behavior: Behavior normal.        Thought Content: Thought content normal.        Judgment: Judgment normal.      Lab Results  Component Value Date   WBC 2.1 (L) 04/09/2019   HGB 10.3 (L) 04/09/2019   HCT 31.7 (L) 04/09/2019   MCV 100.0 04/09/2019   PLT 73 (L) 04/09/2019     Chemistry      Component Value Date/Time   NA 142 04/09/2019 0952   K 4.0 04/09/2019 0952   CL 107 04/09/2019 0952   CO2 29 04/09/2019 0952   BUN 5 (L) 04/09/2019 0952    CREATININE 0.66 04/09/2019 0952   CREATININE 0.70 09/25/2015 1504      Component Value Date/Time   CALCIUM 9.6 04/09/2019 0952   ALKPHOS 88 04/09/2019 0952   AST 41 04/09/2019 0952   ALT 22 04/09/2019 0952   BILITOT 0.8 04/09/2019 0952       Impression and Plan: Ms. Traber is a 59 year old African-American female.  She has acquired  TTP.    Again, we are going to hold on her plasma exchange now.  I feel confident that she is in remission from the TTP.  We will see what her ADAMTS-13 level is.  I would like to get her back in another 4 weeks so we can see how her blood counts are holding.  Volanda Napoleon, MD 2/2/202110:40 AM

## 2019-04-10 LAB — ADAMTS13 ACTIVITY: Adamts 13 Activity: 33.6 % — ABNORMAL LOW (ref >66.8)

## 2019-04-10 LAB — ADAMTS13 ACTIVITY REFLEX

## 2019-04-12 ENCOUNTER — Ambulatory Visit (HOSPITAL_COMMUNITY)
Admission: RE | Admit: 2019-04-12 | Discharge: 2019-04-12 | Disposition: A | Discharge status: Home / Self Care | Payer: Federal, State, Local not specified - PPO | Source: Ambulatory Visit | Attending: Hematology & Oncology | Admitting: Hematology & Oncology

## 2019-04-12 ENCOUNTER — Other Ambulatory Visit: Payer: Self-pay

## 2019-04-12 ENCOUNTER — Other Ambulatory Visit: Payer: Self-pay | Admitting: Hematology & Oncology

## 2019-04-12 DIAGNOSIS — M311 Thrombotic microangiopathy: Secondary | ICD-10-CM | POA: Insufficient documentation

## 2019-04-12 DIAGNOSIS — M3119 Other thrombotic microangiopathy: Secondary | ICD-10-CM

## 2019-04-12 DIAGNOSIS — Z4901 Encounter for fitting and adjustment of extracorporeal dialysis catheter: Secondary | ICD-10-CM | POA: Insufficient documentation

## 2019-04-12 HISTORY — PX: IR REMOVAL TUN CV CATH W/O FL: IMG2289

## 2019-04-12 MED ORDER — LIDOCAINE-EPINEPHRINE 1 %-1:100000 IJ SOLN
INTRAMUSCULAR | Status: AC
  Filled 2019-04-12: qty 1

## 2019-04-12 NOTE — Procedures (Signed)
TTP, completed Rx  S/p RT IJ TUNNELED CATH REMOVAL NO COMP STABLE EBL MIN FULL REPORT IN PACS

## 2019-04-15 ENCOUNTER — Encounter: Payer: Federal, State, Local not specified - PPO | Admitting: Obstetrics & Gynecology

## 2019-04-15 ENCOUNTER — Ambulatory Visit: Payer: Federal, State, Local not specified - PPO

## 2019-04-15 NOTE — Progress Notes (Deleted)
Cardiology Clinic Note   Patient Name: Vicki [name] Date of Encounter: 04/15/2019  Primary Care Provider:  Lesleigh Noe, MD Primary Cardiologist:  Quay Burow, MD  Patient Profile    Vicki [name] 59 year old female presents today for follow-up evaluation of her palpitations, chest pain, and lower extremity pain.  Past Medical History    Past Medical History:  Diagnosis Date  . Allergy   . Arthritis   . Boil   . 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 Diabetes ([place])    type 2  . Elevated liver enzymes   . Fatty liver   . Hyperlipemia   . Hypertension   . 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 PVC's (premature ventricular contractions)   . TTP (thrombotic thrombocytopenic purpura) ([place]) 03/19/2019   Past Surgical History:  Procedure Laterality Date  . COLONOSCOPY  2014  . ECTOPIC PREGNANCY SURGERY  1990  . IR FLUORO GUIDE CV LINE RIGHT  03/05/2019  . IR FLUORO GUIDE CV LINE RIGHT  03/15/2019  . IR REMOVAL TUN CV CATH W/O FL  04/12/2019  . IR US GUIDE VASC ACCESS RIGHT  03/05/2019  . IR US GUIDE VASC ACCESS RIGHT  03/15/2019  . TONSILLECTOMY  1980  . UPPER GI ENDOSCOPY  08/2017   [facility]    Allergies  Allergies  Allergen Reactions  . Metformin And Related Itching  . Sulfa Antibiotics Itching  . Sulfasalazine Itching    History of Present Illness    Ms. Nachreiner has a past medical history of thrombotic thrombocytopenia purpura, liver cirrhosis secondary to [facility], diabetes mellitus type 2, acquired pancytopenia, elevated liver enzymes, joint pain in the ankle and foot, palpitations, hypertension, and lower extremity pain.  She does have a family history of CAD.  Normal echocardiogram 2017 and a cardiac event monitor 2018 showing sinus rhythm/sinus tachycardia.  She was last seen by Truitt Merle, NP on 05/2018.  During that time she indicated that she was noticing an increased heart rate and chest discomfort when  walking.  She had decided that she wanted to start exercising but requested a cardiac evaluation prior to starting an exercise program.  Her symptoms resolved with rest.  She indicated that her pain was worse with turning and bending over type activities.  She did not have increased pain with increased exertion.  She also noticed that she had 1 toes on the left to her left shin.  She did indicate that she had occasional leg pain with walking.  At that time she was working third shift for the [employer].  Her blood pressure was also high during that visit, her blood pressure was lower at home.  She is also not been taking her Benicar HCT.  An ETT was ordered however, she did not have the test performed.  She also indicated that she would like to monitor her blood pressure more at home before resuming medication.  She presents to the clinic today states***  *** denies chest pain, shortness of breath, lower extremity edema, fatigue, palpitations, melena, hematuria, hemoptysis, diaphoresis, weakness, presyncope, syncope, orthopnea, and PND.     Home Medications    Prior to Admission medications   Medication Sig Start Date End Date Taking? Authorizing Provider  alum & mag hydroxide-simeth (MAALOX PLUS) 400-400-40 MG/5ML suspension Take 10 mLs by mouth every 6 (six) hours as needed for indigestion.     [provider]  Alum Hydroxide-Mag Carbonate (GAVISCON EXTRA STRENGTH PO)  Take 1 tablet by mouth 4 (four) times daily as needed. 03/26/19   [provider]  calcium carbonate (TUMS - DOSED IN MG ELEMENTAL CALCIUM) 500 MG chewable tablet Chew 2 tablets (400 mg of elemental calcium total) by mouth every 3 (three) hours. 03/15/19   Swayze, Ava, DO  cholecalciferol (VITAMIN D3) 25 MCG (1000 UT) tablet Take 1,000 Units by mouth daily.    [provider]  dextromethorphan-guaiFENesin (MUCINEX DM) 30-600 MG 12hr tablet Take 2 tablets by mouth 2 (two) times daily as needed for cough.     [provider]  diphenhydrAMINE (BENADRYL) 50 MG capsule Take 1 capsule (50 mg total) by mouth daily as needed (on call for plasma exchange). 03/15/19   Swayze, Ava, DO  Fexofenadine-Pseudoephedrine (ALLEGRA-D 24 HOUR PO) Take by mouth.    [provider]  fluorometholone (FML) 0.1 % ophthalmic suspension Place 1 drop into the left eye 4 (four) times daily.     [provider]  folic acid (FOLVITE) 1 MG tablet Take 2 tablets (2 mg total) by mouth daily. 04/09/19   Volanda Napoleon, MD  furosemide (LASIX) 40 MG tablet Take 1 tablet (40 mg total) by mouth daily. 03/27/19   Volanda Napoleon, MD  glucose blood test strip One touch ultra 11/11/15   [provider]  Multiple Vitamins-Minerals (MULTI FOR HER 50+) TABS Take 1 tablet by mouth daily.    [provider]  potassium chloride (KLOR-CON) 10 MEQ tablet Take 1 tablet (10 mEq total) by mouth daily. 03/27/19   Volanda Napoleon, MD  senna-docusate (SENOKOT-S) 8.6-50 MG tablet Take 2 tablets by mouth 2 (two) times daily. 03/15/19   Swayze, Ava, DO  traMADol (ULTRAM) 50 MG tablet Take 1 tablet (50 mg total) by mouth every 6 (six) hours as needed. 03/15/19 03/14/20  Swayze, Ava, DO    Family History    Family History  Problem Relation Age of Onset  . Hypertension Mother   . Diabetes Mother   . Heart attack Mother 70  . Asthma Mother        Childhood  . Arthritis Mother   . Heart disease Mother   . Hyperlipidemia Mother   . Hypertension Sister   . Diabetes Sister   . Asthma Sister 40       asthma attack cause of death  . Diabetes Brother   . Hypertension Brother   . Diabetes Brother   . Hypertension Brother   . Hypertension Maternal Grandmother   . Heart attack Maternal Grandfather 60  . Colon cancer Neg Hx    She indicated that her mother is alive. She indicated that her father is deceased. She indicated that two of her three sisters are alive. She indicated that both of her brothers are alive. She  indicated that her maternal grandmother is deceased. She indicated that her maternal grandfather is deceased. She indicated that her paternal grandmother is deceased. She indicated that her paternal grandfather is deceased. She indicated that her daughter is alive. She indicated that the status of her neg hx is unknown.  Social History    Social History   Socioeconomic History  . Marital status: Single    Spouse name: Not on file  . Number of children: 1  . Years of education: Not on file  . Highest education level: Some college, no degree  Occupational History    Employer: [employer]  Tobacco Use  . Smoking status: Never Smoker  . Smokeless tobacco: Never  Used  Substance and Sexual Activity  . Alcohol use: Yes    Alcohol/week: 3.0 standard drinks    Types: 3 Standard drinks or equivalent per week    Comment: trying to reduce  . Drug use: No  . Sexual activity: Not Currently    Birth control/protection: Condom  Other Topics Concern  . 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 Difficulty of Paying Living Expenses: Not on file  Food Insecurity:   . Worried About Charity fundraiser in the Last Year: Not on file  . Ran Out of Food in the Last Year: Not on file  Transportation Needs:   . Lack of Transportation (Medical): Not on file  . Lack of Transportation (Non-Medical): Not on file  Physical Activity:   . Days of Exercise per Week: Not on file  . Minutes of Exercise per Session: Not on file  Stress:   . Feeling of Stress : Not on file    Social Connections:   . Frequency of Communication with Friends and Family: Not on file  . Frequency of Social Gatherings with Friends and Family: Not on file  . Attends Religious Services: Not on file  . Active Member of Clubs or Organizations: Not on file  . Attends Archivist Meetings: Not on file  . Marital Status: Not on file  Intimate Partner Violence:   . Fear of Current or Ex-Partner: Not on file  . Emotionally Abused: Not on file  . Physically Abused: Not on file  . Sexually Abused: Not on file     Review of Systems    General:  No chills, fever, night sweats or weight changes.  Cardiovascular:  No chest pain, dyspnea on exertion, edema, orthopnea, palpitations, paroxysmal nocturnal dyspnea. Dermatological: No rash, lesions/masses Respiratory: No cough, dyspnea Urologic: No hematuria, dysuria Abdominal:   No nausea, vomiting, diarrhea, bright red blood per rectum, melena, or hematemesis Neurologic:  No visual changes, wkns, changes in mental status. All other systems reviewed and are otherwise negative except as noted above.  Physical Exam    VS:  LMP 04/30/2012  , BMI There is no height or weight on file to calculate BMI. GEN: Well nourished, well developed, in no acute distress. HEENT: normal. Neck: Supple, no JVD, carotid bruits, or masses. Cardiac: RRR, no murmurs, rubs, or gallops. No clubbing, cyanosis, edema.  Radials/DP/PT 2+ and equal bilaterally.  Respiratory:  Respirations regular and unlabored, clear to auscultation bilaterally. GI: Soft, nontender, nondistended, BS + x 4. MS: no deformity or atrophy. Skin: warm and dry, no rash. Neuro:  Strength and sensation are intact. Psych: Normal affect.  Accessory Clinical Findings    ECG personally reviewed by me today- *** - No acute changes  EKG 03/11/2019 Sinus rhythm no ST or T wave deviation 93 bpm  Echocardiogram 04/24/2015 Study  Conclusions   - Left ventricle: The cavity size was normal.  Systolic function was  normal. The estimated ejection fraction was in the range of 60%  to 65%. Wall motion was normal; there were no regional wall  motion abnormalities. Doppler parameters are consistent with  abnormal left ventricular relaxation (grade 1 diastolic  dysfunction). There was no evidence of elevated ventricular  filling pressure by Doppler parameters.  - Aortic valve: Trileaflet; normal thickness leaflets. There was no  regurgitation.  - Aortic root: The aortic root was normal in size.  - Mitral valve: Structurally normal valve. There was no  regurgitation.  - Left atrium: The atrium was mildly dilated.  - Right ventricle: The cavity size was normal. Wall thickness was  normal. Systolic function was normal.  - Right atrium: The atrium was normal in size.  - Tricuspid valve: There was trivial regurgitation.  - Pulmonic valve: There was no regurgitation.  - Pulmonary arteries: Systolic pressure was within the normal  range.  - Inferior vena cava: The vessel was normal in size.  - Pericardium, extracardiac: There was no pericardial effusion.  Cardiac event monitor 01/30/2017 Sinus rhythm/sinus tachycardia  Assessment & Plan   1.  Chest discomfort-no chest discomfort today. Heart healthy low-sodium diet Increase physical activity as tolerated Maintain p.o. hydration Increase physical activity as tolerated  Palpitations-EKG today shows*** Heart healthy low-sodium diet Avoid triggers caffeine, chocolate, EtOH, OSA, etc.  Essential hypertension-BP today*** Heart healthy low-sodium diet-salty 6 given Increase physical activity as tolerated Continue weight loss  Lower extremity pain-no further leg discomfort. Continue heart healthy low-sodium diet Increase physical activity as tolerated ***Lower extremity  Doppler and ABIs  Disposition: Follow-up with Dr. Gwenlyn Found in 1 year.  Jossie Ng. [facility]  [facility] [address] Office [phone] Fax [phone]

## 2019-04-16 ENCOUNTER — Ambulatory Visit: Payer: Federal, State, Local not specified - PPO | Admitting: General Practice

## 2019-04-16 NOTE — Progress Notes (Deleted)
Cardiology Clinic Note   Patient Name: Vicki [name] Date of Encounter: 04/16/2019  Primary Care Provider:  Lesleigh Noe, MD Primary Cardiologist:  Quay Burow, MD  Patient Profile    ***  Past Medical History    Past Medical History:  Diagnosis Date  . Allergy   . Arthritis   . Boil   . 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 Diabetes ([place])    type 2  . Elevated liver enzymes   . Fatty liver   . Hyperlipemia   . Hypertension   . 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 PVC's (premature ventricular contractions)   . 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 COLONOSCOPY  2014  . ECTOPIC PREGNANCY SURGERY  1990  . IR FLUORO GUIDE CV LINE RIGHT  03/05/2019  . IR FLUORO GUIDE CV LINE RIGHT  03/15/2019  . IR REMOVAL TUN CV CATH W/O FL  04/12/2019  . IR US GUIDE VASC ACCESS RIGHT  03/05/2019  . IR US GUIDE VASC ACCESS RIGHT  03/15/2019  . TONSILLECTOMY  1980  . UPPER GI ENDOSCOPY  08/2017   [facility]    Allergies  Allergies  Allergen Reactions  . Metformin And Related Itching  . Sulfa Antibiotics Itching  . Sulfasalazine Itching    History of Present Illness    ***  Home Medications    Prior to Admission medications   Medication Sig Start Date End Date Taking? Authorizing Provider  alum & mag hydroxide-simeth (MAALOX PLUS) 400-400-40 MG/5ML suspension Take 10 mLs by mouth every 6 (six) hours as needed for indigestion.     [provider]  Alum Hydroxide-Mag Carbonate (GAVISCON EXTRA STRENGTH PO) Take 1 tablet by mouth 4 (four) times daily as needed. 03/26/19   [provider]  calcium carbonate (TUMS - DOSED IN MG ELEMENTAL CALCIUM) 500 MG chewable tablet Chew 2 tablets (400 mg of elemental calcium total) by mouth every 3 (three) hours. 03/15/19   Swayze, Ava, DO  cholecalciferol (VITAMIN D3) 25 MCG (1000 UT) tablet Take 1,000  Units by mouth daily.    [provider]  dextromethorphan-guaiFENesin (MUCINEX DM) 30-600 MG 12hr tablet Take 2 tablets by mouth 2 (two) times daily as needed for cough.    [provider]  diphenhydrAMINE (BENADRYL) 50 MG capsule Take 1 capsule (50 mg total) by mouth daily as needed (on call for plasma exchange). 03/15/19   Swayze, Ava, DO  Fexofenadine-Pseudoephedrine (ALLEGRA-D 24 HOUR PO) Take by mouth.    [provider]  fluorometholone (FML) 0.1 % ophthalmic suspension Place 1 drop into the left eye 4 (four) times daily.     [provider]  folic acid (FOLVITE) 1 MG tablet Take 2 tablets (2 mg total) by mouth daily. 04/09/19   Volanda Napoleon, MD  furosemide (LASIX) 40 MG tablet Take 1 tablet (40 mg total) by mouth daily. 03/27/19   Volanda Napoleon, MD  glucose blood test strip One touch ultra 11/11/15   [provider]  Multiple Vitamins-Minerals (MULTI FOR HER 50+) TABS Take 1 tablet by mouth daily.    [provider]  potassium chloride (KLOR-CON) 10 MEQ tablet Take 1 tablet (10 mEq total) by mouth daily. 03/27/19   Volanda Napoleon, MD  senna-docusate (SENOKOT-S) 8.6-50 MG tablet Take 2 tablets by mouth 2 (two) times daily. 03/15/19   Swayze, Ava, DO  traMADol (  ULTRAM) 50 MG tablet Take 1 tablet (50 mg total) by mouth every 6 (six) hours as needed. 03/15/19 03/14/20  Swayze, Ava, DO    Family History    Family History  Problem Relation Age of Onset  . Hypertension Mother   . Diabetes Mother   . Heart attack Mother 52  . Asthma Mother        Childhood  . Arthritis Mother   . Heart disease Mother   . Hyperlipidemia Mother   . Hypertension Sister   . Diabetes Sister   . Asthma Sister 40       asthma attack cause of death  . Diabetes Brother   . Hypertension Brother   . Diabetes Brother   . Hypertension Brother   . Hypertension Maternal Grandmother   . Heart attack Maternal Grandfather 60  . Colon cancer Neg Hx    She indicated  that her mother is alive. She indicated that her father is deceased. She indicated that two of her three sisters are alive. She indicated that both of her brothers are alive. She indicated that her maternal grandmother is deceased. She indicated that her maternal grandfather is deceased. She indicated that her paternal grandmother is deceased. She indicated that her paternal grandfather is deceased. She indicated that her daughter is alive. She indicated that the status of her neg hx is unknown.  Social History    Social History   Socioeconomic History  . Marital status: Single    Spouse name: Not on file  . Number of children: 1  . Years of education: Not on file  . Highest education level: Some college, no degree  Occupational History    Employer: [employer]  Tobacco Use  . Smoking status: Never Smoker  . Smokeless tobacco: Never Used  Substance and Sexual Activity  . Alcohol use: Yes    Alcohol/week: 3.0 standard drinks    Types: 3 Standard drinks or equivalent per week    Comment: trying to reduce  . Drug use: No  . Sexual activity: Not Currently    Birth control/protection: Condom  Other Topics Concern  . 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 Difficulty of Paying Living Expenses: Not on file  Food Insecurity:   . Worried About Charity fundraiser in the Last Year: Not on file  . Ran Out of Food in the Last Year: Not on file  Transportation Needs:   . Lack of Transportation (Medical): Not on file  . Lack of Transportation  (Non-Medical): Not on file  Physical Activity:   . Days of Exercise per Week: Not on file  . Minutes of Exercise per Session: Not on file  Stress:   . Feeling of Stress : Not on file  Social Connections:   . Frequency of Communication with Friends and Family: Not on  file  . Frequency of Social Gatherings with Friends and Family: Not on file  . Attends Religious Services: Not on file  . Active Member of Clubs or Organizations: Not on file  . Attends Archivist Meetings: Not on file  . Marital Status: Not on file  Intimate Partner Violence:   . Fear of Current or Ex-Partner: Not on file  . Emotionally Abused: Not on file  . Physically Abused: Not on file  . Sexually Abused: Not on file     Review of Systems    General:  No chills, fever, night sweats or weight changes.  Cardiovascular:  No chest pain, dyspnea on exertion, edema, orthopnea, palpitations, paroxysmal nocturnal dyspnea. Dermatological: No rash, lesions/masses Respiratory: No cough, dyspnea Urologic: No hematuria, dysuria Abdominal:   No nausea, vomiting, diarrhea, bright red blood per rectum, melena, or hematemesis Neurologic:  No visual changes, wkns, changes in mental status. All other systems reviewed and are otherwise negative except as noted above.  Physical Exam    VS:  LMP 04/30/2012  , BMI There is no height or weight on file to calculate BMI. GEN: Well nourished, well developed, in no acute distress. HEENT: normal. Neck: Supple, no JVD, carotid bruits, or masses. Cardiac: RRR, no murmurs, rubs, or gallops. No clubbing, cyanosis, edema.  Radials/DP/PT 2+ and equal bilaterally.  Respiratory:  Respirations regular and unlabored, clear to auscultation bilaterally. GI: Soft, nontender, nondistended, BS + x 4. MS: no deformity or atrophy. Skin: warm and dry, no rash. Neuro:  Strength and sensation are intact. Psych: Normal affect.  Accessory Clinical Findings    ECG personally reviewed by me  today- *** - No acute changes  Assessment & Plan   1.  ***  Jossie Ng. [facility] [place] [address] Office [phone] Fax [phone]

## 2019-04-18 ENCOUNTER — Ambulatory Visit: Payer: Federal, State, Local not specified - PPO | Admitting: Family Medicine

## 2019-04-18 DIAGNOSIS — H15113 Episcleritis periodica fugax, bilateral: Secondary | ICD-10-CM | POA: Diagnosis not present

## 2019-04-18 DIAGNOSIS — H04123 Dry eye syndrome of bilateral lacrimal glands: Secondary | ICD-10-CM | POA: Diagnosis not present

## 2019-04-18 DIAGNOSIS — H0102B Squamous blepharitis left eye, upper and lower eyelids: Secondary | ICD-10-CM | POA: Diagnosis not present

## 2019-04-18 DIAGNOSIS — H0102A Squamous blepharitis right eye, upper and lower eyelids: Secondary | ICD-10-CM | POA: Diagnosis not present

## 2019-04-19 ENCOUNTER — Other Ambulatory Visit: Payer: Self-pay

## 2019-04-22 ENCOUNTER — Encounter: Payer: Federal, State, Local not specified - PPO | Admitting: Obstetrics & Gynecology

## 2019-04-22 ENCOUNTER — Ambulatory Visit: Payer: Federal, State, Local not specified - PPO | Admitting: Podiatry

## 2019-04-22 ENCOUNTER — Other Ambulatory Visit: Payer: Self-pay

## 2019-04-22 ENCOUNTER — Encounter: Payer: Self-pay | Admitting: Podiatry

## 2019-04-22 DIAGNOSIS — M79674 Pain in right toe(s): Secondary | ICD-10-CM

## 2019-04-22 DIAGNOSIS — B351 Tinea unguium: Secondary | ICD-10-CM

## 2019-04-22 DIAGNOSIS — E119 Type 2 diabetes mellitus without complications: Secondary | ICD-10-CM

## 2019-04-22 DIAGNOSIS — M79675 Pain in left toe(s): Secondary | ICD-10-CM

## 2019-04-22 NOTE — Patient Instructions (Signed)
Diabetes Mellitus and Foot Care Foot care is an important part of your health, especially when you have diabetes. Diabetes may cause you to have problems because of poor blood flow (circulation) to your feet and legs, which can cause your skin to:  Become thinner and drier.  Break more easily.  Heal more slowly.  Peel and crack. You may also have nerve damage (neuropathy) in your legs and feet, causing decreased feeling in them. This means that you may not notice minor injuries to your feet that could lead to more serious problems. Noticing and addressing any potential problems early is the best way to prevent future foot problems. How to care for your feet Foot hygiene  Wash your feet daily with warm water and mild soap. Do not use hot water. Then, pat your feet and the areas between your toes until they are completely dry. Do not soak your feet as this can dry your skin.  Trim your toenails straight across. Do not dig under them or around the cuticle. File the edges of your nails with an emery board or nail file.  Apply a moisturizing lotion or petroleum jelly to the skin on your feet and to dry, brittle toenails. Use lotion that does not contain alcohol and is unscented. Do not apply lotion between your toes. Shoes and socks  Wear clean socks or stockings every day. Make sure they are not too tight. Do not wear knee-high stockings since they may decrease blood flow to your legs.  Wear shoes that fit properly and have enough cushioning. Always look in your shoes before you put them on to be sure there are no objects inside.  To break in new shoes, wear them for just a few hours a day. This prevents injuries on your feet. Wounds, scrapes, corns, and calluses  Check your feet daily for blisters, cuts, bruises, sores, and redness. If you cannot see the bottom of your feet, use a mirror or ask someone for help.  Do not cut corns or calluses or try to remove them with medicine.  If you  find a minor scrape, cut, or break in the skin on your feet, keep it and the skin around it clean and dry. You may clean these areas with mild soap and water. Do not clean the area with peroxide, alcohol, or iodine.  If you have a wound, scrape, corn, or callus on your foot, look at it several times a day to make sure it is healing and not infected. Check for: ? Redness, swelling, or pain. ? Fluid or blood. ? Warmth. ? Pus or a bad smell. General instructions  Do not cross your legs. This may decrease blood flow to your feet.  Do not use heating pads or hot water bottles on your feet. They may burn your skin. If you have lost feeling in your feet or legs, you may not know this is happening until it is too late.  Protect your feet from hot and cold by wearing shoes, such as at the beach or on hot pavement.  Schedule a complete foot exam at least once a year (annually) or more often if you have foot problems. If you have foot problems, report any cuts, sores, or bruises to your health care provider immediately. Contact a health care provider if:  You have a medical condition that increases your risk of infection and you have any cuts, sores, or bruises on your feet.  You have an injury that is not   healing.  You have redness on your legs or feet.  You feel burning or tingling in your legs or feet.  You have pain or cramps in your legs and feet.  Your legs or feet are numb.  Your feet always feel cold.  You have pain around a toenail. Get help right away if:  You have a wound, scrape, corn, or callus on your foot and: ? You have pain, swelling, or redness that gets worse. ? You have fluid or blood coming from the wound, scrape, corn, or callus. ? Your wound, scrape, corn, or callus feels warm to the touch. ? You have pus or a bad smell coming from the wound, scrape, corn, or callus. ? You have a fever. ? You have a red line going up your leg. Summary  Check your feet every day  for cuts, sores, red spots, swelling, and blisters.  Moisturize feet and legs daily.  Wear shoes that fit properly and have enough cushioning.  If you have foot problems, report any cuts, sores, or bruises to your health care provider immediately.  Schedule a complete foot exam at least once a year (annually) or more often if you have foot problems. This information is not intended to replace advice given to you by your health care provider. Make sure you discuss any questions you have with your health care provider. Document Revised: 11/14/2018 Document Reviewed: 03/25/2016 Elsevier Patient Education  2020 Elsevier Inc.  

## 2019-04-23 ENCOUNTER — Ambulatory Visit: Payer: Federal, State, Local not specified - PPO | Admitting: Family Medicine

## 2019-04-23 ENCOUNTER — Encounter: Payer: Self-pay | Admitting: Family Medicine

## 2019-04-23 VITALS — BP 132/84 | HR 82 | Temp 98.4°F | Ht 61.0 in | Wt 151.8 lb

## 2019-04-23 DIAGNOSIS — E119 Type 2 diabetes mellitus without complications: Secondary | ICD-10-CM | POA: Diagnosis not present

## 2019-04-23 DIAGNOSIS — R739 Hyperglycemia, unspecified: Secondary | ICD-10-CM | POA: Diagnosis not present

## 2019-04-23 DIAGNOSIS — L02224 Furuncle of groin: Secondary | ICD-10-CM | POA: Diagnosis not present

## 2019-04-23 DIAGNOSIS — D61818 Other pancytopenia: Secondary | ICD-10-CM | POA: Diagnosis not present

## 2019-04-23 NOTE — Assessment & Plan Note (Signed)
Pt not currently on medication and previous HgbA1c was excellent however, also with severe anemia likely contributing to this. Now with elevated glucose. Will check fructoasamine to get a better average. Side effect to metformin and sulfa allergy. Will likely need to consider other medication. Pt interested in diet/nutrition and life style. Will readdress medication if no improvement or if severely elevated.

## 2019-04-23 NOTE — Assessment & Plan Note (Signed)
Improving with treatment, but unable to do hemoglobin a1c due to history.

## 2019-04-23 NOTE — Assessment & Plan Note (Signed)
Small lesion. Previous one resolved w/o intervention. Warm compression and topical abx. Return if increasing in size or not draining for I&D. Other lesion possible sebacceous cyst w/o sign of infection.

## 2019-04-23 NOTE — Patient Instructions (Signed)
#   Boil - warm compress for 5-15 minutes every hour if possible - If not improving, would recommend return visit for possible drainage - if you develop fevers/chills, worsening symptoms call or return to clinic  #Hyperglycemia - blood work today - diabetes nutrition referral.

## 2019-04-23 NOTE — Progress Notes (Signed)
Subjective:     Vicki [name] is a 58 y.o. female presenting for boils (vaginal area) and Hyperglycemia (after hospital stay her sugar has been elevated over 200s)     HPI   #Hyperglycemia - has been having high glucose levels - has been working on diet - think that she has had some poor healing lately -   #boils - started noticing them 2-3 weeks ago - one on the right perineum - improved with antibacterial ointment - then one on the left side - has been doing the ointment on the left side - did get large overnight but never drained  #port site - removed 1.5 weeks ago - feels like the incision has not fully healed - the skin area does not feel healed - endorses some pain at the site and in the shoulder - had an ache while it was present but now pain - endorses some occasional chest pain    Review of Systems  Constitutional: Negative for chills and fever.     Social History   Tobacco Use  Smoking Status Never Smoker  Smokeless Tobacco Never Used        Objective:    BP Readings from Last 3 Encounters:  04/23/19 132/84  04/09/19 (!) 139/57  04/08/19 125/74   Wt Readings from Last 3 Encounters:  04/23/19 151 lb 12 oz (68.8 kg)  04/09/19 149 lb 12 oz (67.9 kg)  04/08/19 152 lb 1.9 oz (69 kg)    BP 132/84   Pulse 82   Temp 98.4 F (36.9 C)   Ht 5' 1"  (1.549 m)   Wt 151 lb 12 oz (68.8 kg)   LMP 04/30/2012   BMI 28.67 kg/m    Physical Exam Constitutional:      General: She is not in acute distress.    Appearance: She is well-developed. She is not diaphoretic.  HENT:     Right Ear: External ear normal.     Left Ear: External ear normal.     Nose: Nose normal.  Eyes:     Conjunctiva/sclera: Conjunctivae normal.  Cardiovascular:     Rate and Rhythm: Normal rate and regular rhythm.     Heart sounds: No murmur.  Pulmonary:     Effort: Pulmonary effort is normal. No respiratory distress.     Breath sounds: Normal breath sounds. No wheezing.    Musculoskeletal:     Cervical back: Neck supple.  Skin:    General: Skin is warm and dry.     Capillary Refill: Capillary refill takes less than 2 seconds.     Comments: Left thigh/groin junction. Marble sized erythematous papule lesion with mild breakdown of skin. TTP to palpation. Adjacent on the thigh is nodule below the skin w/o erythema which is mobile.   Neurological:     Mental Status: She is alert. Mental status is at baseline.  Psychiatric:        Mood and Affect: Mood normal.        Behavior: Behavior normal.     Last glucose random: 212, 242, 243, 240, 183     Assessment & Plan:   Problem List Items Addressed This Visit      Endocrine   Controlled type 2 diabetes mellitus without complication ([place]) (Chronic)    Pt not currently on medication and previous HgbA1c was excellent however, also with severe anemia likely contributing to this. Now with elevated glucose. Will check fructoasamine to get a better average. Side effect to metformin  and sulfa allergy. Will likely need to consider other medication. Pt interested in diet/nutrition and life style. Will readdress medication if no improvement or if severely elevated.         Musculoskeletal and Integument   Boil of groin    Small lesion. Previous one resolved w/o intervention. Warm compression and topical abx. Return if increasing in size or not draining for I&D. Other lesion possible sebacceous cyst w/o sign of infection.         Hematopoietic and Hemostatic   Acquired pancytopenia ([place])    Improving with treatment, but unable to do hemoglobin a1c due to history.       Relevant Orders   Fructosamine     Other   Blood glucose elevated - Primary   Relevant Orders   Fructosamine   Ambulatory referral to diabetic education       Return in about 3 months (around 07/21/2019), or if symptoms worsen or fail to improve, for diabetes.  Lesleigh Noe, MD

## 2019-04-24 ENCOUNTER — Ambulatory Visit: Payer: Federal, State, Local not specified - PPO | Admitting: Cardiovascular Disease

## 2019-04-25 NOTE — Progress Notes (Signed)
Subjective: Vicki [name] presents today for follow up of preventative diabetic foot care and painful mycotic nails b/l that are difficult to trim. Pain interferes with ambulation. Aggravating factors include wearing enclosed shoe gear. Pain is relieved with periodic professional debridement.   Allergies  Allergen Reactions  . Metformin And Related Itching    Heart palpitation  . Sulfa Antibiotics Itching  . Sulfasalazine Itching     Objective: There were no vitals filed for this visit.  Vascular Examination:  Capillary refill time to digits immediate b/l, palpable DP pulses b/l, palpable PT pulses b/l, pedal hair present b/l and skin temperature gradient within normal limits b/l  Dermatological Examination: Pedal skin with normal turgor, texture and tone bilaterally, no open wounds bilaterally, no interdigital macerations bilaterally and toenails 1-5 b/l elongated, dystrophic, thickened, crumbly with subungual debris  Musculoskeletal: Normal muscle strength 5/5 to all lower extremity muscle groups bilaterally, no gross bony deformities bilaterally and no pain crepitus or joint limitation noted with ROM b/l  Neurological: Protective sensation intact 5/5 intact bilaterally with 10g monofilament b/l and vibratory sensation intact b/l  Assessment: 1. Pain due to onychomycosis of toenails of both feet   2. Controlled type 2 diabetes mellitus without complication, without long-term current use of insulin ([place])    Plan: -Continue diabetic foot care principles. Literature dispensed on today.  -Toenails 1-5 b/l were debrided in length and girth without iatrogenic bleeding. -Discussed treatment options for onychomycosis. Recommended OTC tea tree oil to be applied once daily. If no improvement, will consider compounded topical antifungal solution.  -Patient to continue soft, supportive shoe gear daily. -Patient to report any pedal injuries to medical professional immediately. -Patient/POA to  call should there be question/concern in the interim.  Return in about 6 months (around 10/20/2019) for diabetic nail trim.

## 2019-04-27 LAB — FRUCTOSAMINE: Fructosamine: 381 umol/L — ABNORMAL HIGH (ref 205–285)

## 2019-04-29 ENCOUNTER — Telehealth: Payer: Self-pay | Admitting: Family Medicine

## 2019-04-29 DIAGNOSIS — E119 Type 2 diabetes mellitus without complications: Secondary | ICD-10-CM

## 2019-04-29 MED ORDER — SITAGLIPTIN PHOSPHATE 100 MG PO TABS: 100.0000 mg | ORAL_TABLET | Freq: Every day | ORAL | 0 refills | Status: DC

## 2019-04-29 NOTE — Telephone Encounter (Signed)
Please call patient and let her know that her test for diabetes came back elevated. When you compare this number to a standard hemoglobin a1c it is roughly 9.5. Normal is less than 6.5.   In our visit we discussed trying a medication in addition to diet and exercise if the level was very high. I sent over Januvia which is a pill. There are also injectable non-insulin options we could try.   Plan to return in 3 months for re-check. Call our office is this medication is too expensive and we can try a different medication.

## 2019-04-29 NOTE — Telephone Encounter (Signed)
Left message for patient to call back  

## 2019-04-29 NOTE — Telephone Encounter (Signed)
Patient returned call.  Patient can be reached at  [phone].

## 2019-04-29 NOTE — Telephone Encounter (Signed)
Patient advised and will start new medication

## 2019-05-02 ENCOUNTER — Inpatient Hospital Stay (HOSPITAL_BASED_OUTPATIENT_CLINIC_OR_DEPARTMENT_OTHER): Payer: Federal, State, Local not specified - PPO | Admitting: Hematology & Oncology

## 2019-05-02 ENCOUNTER — Encounter: Payer: Self-pay | Admitting: Hematology & Oncology

## 2019-05-02 ENCOUNTER — Telehealth: Payer: Self-pay | Admitting: Pharmacy Technician

## 2019-05-02 ENCOUNTER — Other Ambulatory Visit: Payer: Self-pay

## 2019-05-02 ENCOUNTER — Inpatient Hospital Stay: Payer: Federal, State, Local not specified - PPO

## 2019-05-02 VITALS — BP 132/82 | HR 79 | Temp 97.5°F | Resp 18 | Wt 149.0 lb

## 2019-05-02 DIAGNOSIS — M311 Thrombotic microangiopathy: Secondary | ICD-10-CM

## 2019-05-02 DIAGNOSIS — K7460 Unspecified cirrhosis of liver: Secondary | ICD-10-CM | POA: Diagnosis not present

## 2019-05-02 DIAGNOSIS — H0102B Squamous blepharitis left eye, upper and lower eyelids: Secondary | ICD-10-CM | POA: Diagnosis not present

## 2019-05-02 DIAGNOSIS — Z79899 Other long term (current) drug therapy: Secondary | ICD-10-CM | POA: Diagnosis not present

## 2019-05-02 DIAGNOSIS — M3119 Other thrombotic microangiopathy: Secondary | ICD-10-CM

## 2019-05-02 DIAGNOSIS — H0102A Squamous blepharitis right eye, upper and lower eyelids: Secondary | ICD-10-CM | POA: Diagnosis not present

## 2019-05-02 DIAGNOSIS — H15113 Episcleritis periodica fugax, bilateral: Secondary | ICD-10-CM | POA: Diagnosis not present

## 2019-05-02 DIAGNOSIS — R531 Weakness: Secondary | ICD-10-CM | POA: Diagnosis not present

## 2019-05-02 DIAGNOSIS — E119 Type 2 diabetes mellitus without complications: Secondary | ICD-10-CM | POA: Diagnosis not present

## 2019-05-02 DIAGNOSIS — H04123 Dry eye syndrome of bilateral lacrimal glands: Secondary | ICD-10-CM | POA: Diagnosis not present

## 2019-05-02 DIAGNOSIS — D72819 Decreased white blood cell count, unspecified: Secondary | ICD-10-CM | POA: Diagnosis not present

## 2019-05-02 DIAGNOSIS — M7989 Other specified soft tissue disorders: Secondary | ICD-10-CM | POA: Diagnosis not present

## 2019-05-02 DIAGNOSIS — Z7984 Long term (current) use of oral hypoglycemic drugs: Secondary | ICD-10-CM | POA: Diagnosis not present

## 2019-05-02 DIAGNOSIS — R5383 Other fatigue: Secondary | ICD-10-CM | POA: Diagnosis not present

## 2019-05-02 LAB — CBC WITH DIFFERENTIAL (CANCER CENTER ONLY)
Abs Immature Granulocytes: 0 10*3/uL (ref 0.00–0.07)
Basophils Absolute: 0 10*3/uL (ref 0.0–0.1)
Basophils Relative: 1 %
Eosinophils Absolute: 0.1 10*3/uL (ref 0.0–0.5)
Eosinophils Relative: 8 %
HCT: 32.1 % — ABNORMAL LOW (ref 36.0–46.0)
Hemoglobin: 10.8 g/dL — ABNORMAL LOW (ref 12.0–15.0)
Immature Granulocytes: 0 %
Lymphocytes Relative: 33 %
Lymphs Abs: 0.6 10*3/uL — ABNORMAL LOW (ref 0.7–4.0)
MCH: 31.8 pg (ref 26.0–34.0)
MCHC: 33.6 g/dL (ref 30.0–36.0)
MCV: 94.4 fL (ref 80.0–100.0)
Monocytes Absolute: 0.2 10*3/uL (ref 0.1–1.0)
Monocytes Relative: 10 %
Neutro Abs: 0.9 10*3/uL — ABNORMAL LOW (ref 1.7–7.7)
Neutrophils Relative %: 48 %
Platelet Count: 52 10*3/uL — ABNORMAL LOW (ref 150–400)
RBC: 3.4 MIL/uL — ABNORMAL LOW (ref 3.87–5.11)
RDW: 12.2 % (ref 11.5–15.5)
WBC Count: 1.8 10*3/uL — ABNORMAL LOW (ref 4.0–10.5)
nRBC: 0 % (ref 0.0–0.2)

## 2019-05-02 LAB — CMP (CANCER CENTER ONLY)
ALT: 34 U/L (ref 0–44)
AST: 55 U/L — ABNORMAL HIGH (ref 15–41)
Albumin: 4.1 g/dL (ref 3.5–5.0)
Alkaline Phosphatase: 101 U/L (ref 38–126)
Anion gap: 7 (ref 5–15)
BUN: 9 mg/dL (ref 6–20)
CO2: 28 mmol/L (ref 22–32)
Calcium: 10.3 mg/dL (ref 8.9–10.3)
Chloride: 105 mmol/L (ref 98–111)
Creatinine: 0.77 mg/dL (ref 0.44–1.00)
GFR, Est AFR Am: >60 mL/min (ref >60)
GFR, Estimated: >60 mL/min (ref >60)
Glucose, Bld: 175 mg/dL — ABNORMAL HIGH (ref 70–99)
Potassium: 3.7 mmol/L (ref 3.5–5.1)
Sodium: 140 mmol/L (ref 135–145)
Total Bilirubin: 0.8 mg/dL (ref 0.3–1.2)
Total Protein: 8.3 g/dL — ABNORMAL HIGH (ref 6.5–8.1)

## 2019-05-02 LAB — RETICULOCYTES
Immature Retic Fract: 13.6 % (ref 2.3–15.9)
RBC.: 3.38 MIL/uL — ABNORMAL LOW (ref 3.87–5.11)
Retic Count, Absolute: 61.9 10*3/uL (ref 19.0–186.0)
Retic Ct Pct: 1.8 % (ref 0.4–3.1)

## 2019-05-02 LAB — SAVE SMEAR(SSMR), FOR PROVIDER SLIDE REVIEW

## 2019-05-02 LAB — LACTATE DEHYDROGENASE: LDH: 271 U/L — ABNORMAL HIGH (ref 98–192)

## 2019-05-02 MED ORDER — DOPTELET 20 MG PO TABS: 40.0000 mg | ORAL_TABLET | Freq: Every day | ORAL | 1 refills | Status: DC

## 2019-05-02 NOTE — Telephone Encounter (Signed)
Oral Oncology Patient Advocate Encounter   Received notification from [facility] FEP that prior authorization for Doptelet is required.   PA submitted on CoverMyMeds Key [id number] Status is pending   Oral Oncology Clinic will continue to follow.  [place] Patient [place] Phone [phone] Fax [phone] 05/07/2019 12:56 PM

## 2019-05-02 NOTE — Progress Notes (Signed)
Hematology and Oncology Follow Up Visit  Vicki [name] [id number] 11-07-1960 59 y.o. 05/02/2019   Principle Diagnosis:   TTP - acquired  NASH -- leukopenia/thrombocytopenia  Current Therapy:    Plasma Exchange - M-Th -- changed to this on 03/26/2019 -- d/c on 04/09/2019     Interim History:  Vicki [name] is back for her follow-up.  She is now off plasma exchange.  She is doing well with this.  She is been off it now for about 3 weeks.  Her last  ADAMTS-13 back in early February was 34%.  This is holding steady..  She does have a little bit of bruising.  There is no obvious petechia.  Again, she has the cirrhosis.  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  I am going to try her on Doptelet.  She is post to have some oral surgery in the future.  I think we really need to get her platelet count up.  The dose of Doptelet will be 40 mg a day.  She has had no problems with obvious bleeding.  There is no fever.  She is just overall weak.  I think she will be chronically weak after having the plasma exchanges.  She does get fatigued fairly easily.  There is no change in bowel or bladder habits.  Overall, her performance status is ECOG 2.   Medications:  Current Outpatient Medications:  .  Alum Hydroxide-Mag Carbonate (GAVISCON EXTRA STRENGTH PO), Take 1 tablet by mouth 4 (four) times daily as needed., Disp: , Rfl:  .  calcium carbonate (TUMS - DOSED IN MG ELEMENTAL CALCIUM) 500 MG chewable tablet, Chew 2 tablets (400 mg of elemental calcium total) by mouth every 3 (three) hours., Disp: 30 tablet, Rfl: 0 .  cholecalciferol (VITAMIN D3) 25 MCG (1000 UT) tablet, Take 1,000 Units by mouth daily., Disp: , Rfl:  .  dextromethorphan-guaiFENesin (MUCINEX DM) 30-600 MG 12hr tablet, Take 2 tablets by mouth 2 (two) times daily as needed for cough., Disp: , Rfl:  .  diphenhydrAMINE (BENADRYL) 50 MG capsule, Take 1 capsule (50 mg total) by mouth daily as needed (on call for  plasma exchange)., Disp: 30 capsule, Rfl: 0 .  erythromycin ophthalmic ointment, INSTILL A SMALL AMOUNT OF OINTMENT INTO LEFT EYE TWICE DAILY, Disp: , Rfl:  .  Fexofenadine-Pseudoephedrine (ALLEGRA-D 24 HOUR PO), Take by mouth., Disp: , Rfl:  .  fluorometholone (FML) 0.1 % ophthalmic suspension, Place 1 drop into the left eye 4 (four) times daily. , Disp: , Rfl:  .  folic acid (FOLVITE) 1 MG tablet, Take 2 tablets (2 mg total) by mouth daily., Disp: 60 tablet, Rfl: 12 .  furosemide (LASIX) 40 MG tablet, Take 1 tablet (40 mg total) by mouth daily., Disp: 30 tablet, Rfl: 1 .  glucose blood test strip, One touch ultra, Disp: , Rfl:  .  Multiple Vitamins-Minerals (MULTI FOR HER 50+) TABS, Take 1 tablet by mouth daily., Disp: , Rfl:  .  potassium chloride (KLOR-CON) 10 MEQ tablet, Take 1 tablet (10 mEq total) by mouth daily., Disp: 30 tablet, Rfl: 1 .  senna-docusate (SENOKOT-S) 8.6-50 MG tablet, Take 2 tablets by mouth 2 (two) times daily., Disp: 20 tablet, Rfl: 0 .  sitaGLIPtin (JANUVIA) 100 MG tablet, Take 1 tablet (100 mg total) by mouth daily., Disp: 90 tablet, Rfl: 0 .  traMADol (ULTRAM) 50 MG tablet, Take 1 tablet (50 mg total) by mouth every 6 (six) hours as needed., Disp: 20 tablet, Rfl: 0  Allergies:  Allergies  Allergen Reactions  . Metformin And Related Itching    Heart palpitation  . Sulfa Antibiotics Itching  . Sulfasalazine Itching    Past Medical History, Surgical history, Social history, and Family History were reviewed and updated.  Review of Systems: Review of Systems  Constitutional: Negative.   HENT:  Negative.   Eyes: Negative.   Respiratory: Negative.   Cardiovascular: Positive for leg swelling.  Gastrointestinal: Negative.   Endocrine: Negative.   Genitourinary: Negative.    Musculoskeletal: Positive for arthralgias and myalgias.  Skin: Negative.   Neurological: Negative.   Hematological: Negative.   Psychiatric/Behavioral: Negative.     Physical Exam:   weight is 149 lb (67.6 kg). Her temporal temperature is 97.5 F (36.4 C) (abnormal). Her blood pressure is 132/82 and her pulse is 79. Her respiration is 18 and oxygen saturation is 100%.   Wt Readings from Last 3 Encounters:  05/02/19 149 lb (67.6 kg)  04/23/19 151 lb 12 oz (68.8 kg)  04/09/19 149 lb 12 oz (67.9 kg)    Physical Exam Vitals reviewed.  HENT:     Head: Normocephalic and atraumatic.  Eyes:     Pupils: Pupils are equal, round, and reactive to light.  Cardiovascular:     Rate and Rhythm: Normal rate and regular rhythm.     Heart sounds: Normal heart sounds.  Pulmonary:     Effort: Pulmonary effort is normal.     Breath sounds: Normal breath sounds.  Abdominal:     General: Bowel sounds are normal.     Palpations: Abdomen is soft.  Musculoskeletal:        General: No tenderness or deformity. Normal range of motion.     Cervical back: Normal range of motion.     Comments: Extremities shows some 1+ edema in her lower legs.  She has good range of motion of her joints.  She has good strength in upper and lower extremities.  Lymphadenopathy:     Cervical: No cervical adenopathy.  Skin:    General: Skin is warm and dry.     Findings: No erythema or rash.  Neurological:     Mental Status: She is alert and oriented to person, place, and time.  Psychiatric:        Behavior: Behavior normal.        Thought Content: Thought content normal.        Judgment: Judgment normal.      Lab Results  Component Value Date   WBC 1.8 (L) 05/02/2019   HGB 10.8 (L) 05/02/2019   HCT 32.1 (L) 05/02/2019   MCV 94.4 05/02/2019   PLT 52 (L) 05/02/2019     Chemistry      Component Value Date/Time   NA 142 04/09/2019 0952   K 4.0 04/09/2019 0952   CL 107 04/09/2019 0952   CO2 29 04/09/2019 0952   BUN 5 (L) 04/09/2019 0952   CREATININE 0.66 04/09/2019 0952   CREATININE 0.70 09/25/2015 1504      Component Value Date/Time   CALCIUM 9.6 04/09/2019 0952   ALKPHOS 88 04/09/2019  0952   AST 41 04/09/2019 0952   ALT 22 04/09/2019 0952   BILITOT 0.8 04/09/2019 0952       Impression and Plan: Vicki [name] is a 58 year old African-American female.  She has acquired TTP.    I forgot to mention that her dialysis catheter was removed.  There were no problems with this.  Hopefully, the Doptelet will get  her platelet count up a little bit higher.  I would like to see her back in 4 weeks.  We will see how everything looks.    Volanda Napoleon, MD 2/25/20218:31 AM

## 2019-05-03 ENCOUNTER — Telehealth: Payer: Self-pay | Admitting: *Deleted

## 2019-05-03 NOTE — Telephone Encounter (Signed)
Message received from patient requesting information on picking up her [place].  Call placed back to patient and patient notified that Dennison Nancy is working on [place] and that she will receive a phone call once PA has been approved.  Pt appreciative of call and has no further questions at this time.

## 2019-05-06 ENCOUNTER — Telehealth: Payer: Self-pay | Admitting: *Deleted

## 2019-05-06 NOTE — Telephone Encounter (Signed)
Opened in error

## 2019-05-06 NOTE — Telephone Encounter (Signed)
Dr. Marin Olp notified of ADAMTS13 Activity less than 2.0.  No new orders received at this time.

## 2019-05-07 LAB — ADAMTS13 ACTIVITY: Adamts 13 Activity: <2 % — CL (ref >66.8)

## 2019-05-07 LAB — ADAMTS13 ANTIBODY: ADAMTS13 Antibody: 48 Units/mL — ABNORMAL HIGH (ref <12)

## 2019-05-08 ENCOUNTER — Ambulatory Visit: Payer: Federal, State, Local not specified - PPO | Admitting: Cardiovascular Disease

## 2019-05-08 ENCOUNTER — Encounter: Payer: Self-pay | Admitting: Cardiovascular Disease

## 2019-05-08 ENCOUNTER — Other Ambulatory Visit: Payer: Self-pay

## 2019-05-08 ENCOUNTER — Inpatient Hospital Stay: Payer: Federal, State, Local not specified - PPO | Attending: Hematology

## 2019-05-08 ENCOUNTER — Inpatient Hospital Stay (HOSPITAL_BASED_OUTPATIENT_CLINIC_OR_DEPARTMENT_OTHER): Payer: Federal, State, Local not specified - PPO | Admitting: Family

## 2019-05-08 DIAGNOSIS — E119 Type 2 diabetes mellitus without complications: Secondary | ICD-10-CM | POA: Insufficient documentation

## 2019-05-08 DIAGNOSIS — Z7984 Long term (current) use of oral hypoglycemic drugs: Secondary | ICD-10-CM | POA: Insufficient documentation

## 2019-05-08 DIAGNOSIS — M3119 Other thrombotic microangiopathy: Secondary | ICD-10-CM

## 2019-05-08 DIAGNOSIS — M311 Thrombotic microangiopathy: Secondary | ICD-10-CM | POA: Insufficient documentation

## 2019-05-08 DIAGNOSIS — Z5112 Encounter for antineoplastic immunotherapy: Secondary | ICD-10-CM | POA: Diagnosis not present

## 2019-05-08 DIAGNOSIS — D72819 Decreased white blood cell count, unspecified: Secondary | ICD-10-CM | POA: Insufficient documentation

## 2019-05-08 DIAGNOSIS — R200 Anesthesia of skin: Secondary | ICD-10-CM | POA: Insufficient documentation

## 2019-05-08 DIAGNOSIS — Z79899 Other long term (current) drug therapy: Secondary | ICD-10-CM | POA: Insufficient documentation

## 2019-05-08 DIAGNOSIS — K7581 Nonalcoholic steatohepatitis (NASH): Secondary | ICD-10-CM | POA: Diagnosis not present

## 2019-05-08 DIAGNOSIS — R202 Paresthesia of skin: Secondary | ICD-10-CM | POA: Diagnosis not present

## 2019-05-08 DIAGNOSIS — R5383 Other fatigue: Secondary | ICD-10-CM | POA: Diagnosis not present

## 2019-05-08 DIAGNOSIS — R233 Spontaneous ecchymoses: Secondary | ICD-10-CM | POA: Diagnosis not present

## 2019-05-08 DIAGNOSIS — R002 Palpitations: Secondary | ICD-10-CM | POA: Diagnosis not present

## 2019-05-08 DIAGNOSIS — R600 Localized edema: Secondary | ICD-10-CM | POA: Diagnosis not present

## 2019-05-08 DIAGNOSIS — K7460 Unspecified cirrhosis of liver: Secondary | ICD-10-CM | POA: Insufficient documentation

## 2019-05-08 DIAGNOSIS — R011 Cardiac murmur, unspecified: Secondary | ICD-10-CM

## 2019-05-08 LAB — CBC WITH DIFFERENTIAL (CANCER CENTER ONLY)
Abs Immature Granulocytes: 0.01 10*3/uL (ref 0.00–0.07)
Basophils Absolute: 0 10*3/uL (ref 0.0–0.1)
Basophils Relative: 0 %
Eosinophils Absolute: 0.1 10*3/uL (ref 0.0–0.5)
Eosinophils Relative: 6 %
HCT: 31.8 % — ABNORMAL LOW (ref 36.0–46.0)
Hemoglobin: 10.7 g/dL — ABNORMAL LOW (ref 12.0–15.0)
Immature Granulocytes: 1 %
Lymphocytes Relative: 33 %
Lymphs Abs: 0.6 10*3/uL — ABNORMAL LOW (ref 0.7–4.0)
MCH: 31.5 pg (ref 26.0–34.0)
MCHC: 33.6 g/dL (ref 30.0–36.0)
MCV: 93.5 fL (ref 80.0–100.0)
Monocytes Absolute: 0.2 10*3/uL (ref 0.1–1.0)
Monocytes Relative: 9 %
Neutro Abs: 0.9 10*3/uL — ABNORMAL LOW (ref 1.7–7.7)
Neutrophils Relative %: 51 %
Platelet Count: 47 10*3/uL — ABNORMAL LOW (ref 150–400)
RBC: 3.4 MIL/uL — ABNORMAL LOW (ref 3.87–5.11)
RDW: 12.1 % (ref 11.5–15.5)
WBC Count: 1.8 10*3/uL — ABNORMAL LOW (ref 4.0–10.5)
nRBC: 0 % (ref 0.0–0.2)

## 2019-05-08 LAB — CMP (CANCER CENTER ONLY)
ALT: 32 U/L (ref 0–44)
AST: 53 U/L — ABNORMAL HIGH (ref 15–41)
Albumin: 4.1 g/dL (ref 3.5–5.0)
Alkaline Phosphatase: 87 U/L (ref 38–126)
Anion gap: 6 (ref 5–15)
BUN: 6 mg/dL (ref 6–20)
CO2: 28 mmol/L (ref 22–32)
Calcium: 10.1 mg/dL (ref 8.9–10.3)
Chloride: 104 mmol/L (ref 98–111)
Creatinine: 0.72 mg/dL (ref 0.44–1.00)
GFR, Est AFR Am: >60 mL/min (ref >60)
GFR, Estimated: >60 mL/min (ref >60)
Glucose, Bld: 188 mg/dL — ABNORMAL HIGH (ref 70–99)
Potassium: 3.7 mmol/L (ref 3.5–5.1)
Sodium: 138 mmol/L (ref 135–145)
Total Bilirubin: 0.9 mg/dL (ref 0.3–1.2)
Total Protein: 8.4 g/dL — ABNORMAL HIGH (ref 6.5–8.1)

## 2019-05-08 LAB — SAVE SMEAR(SSMR), FOR PROVIDER SLIDE REVIEW

## 2019-05-08 NOTE — Patient Instructions (Signed)
Medication Instructions:  Your physician recommends that you continue on your current medications as directed. Please refer to the Current Medication list given to you today.  If you need a refill on your cardiac medications before your next appointment, please call your pharmacy.   Lab work: NONE  Testing/Procedures: Your physician has requested that you have an echocardiogram. Echocardiography is a painless test that uses sound waves to create images of your heart. It provides your doctor with information about the size and shape of your heart and how well your heart's chambers and valves are working. This procedure takes approximately one hour. There are no restrictions for this procedure. [address]  Follow-Up: At [facility], you and your health needs are our priority.  As part of our continuing mission to provide you with exceptional heart care, we have created designated Provider Care Teams.  These Care Teams include your primary Cardiologist (physician) and Advanced Practice Providers (APPs -  Physician Assistants and Nurse Practitioners) who all work together to provide you with the care you need, when you need it. You may see Quay Burow, MD or one of the following Advanced Practice Providers on your designated Care Team:    Kerin Ransom, PA-C  [place], [place]  Coletta Memos, [place]  Your physician wants you to follow-up: as needed

## 2019-05-08 NOTE — Telephone Encounter (Signed)
Oral Oncology Patient Advocate Encounter  Received notification from [facility]EP that the request for prior authorization for Doptelet has been denied due to not using the medication for an upcoming medical or dental procedure.     Alyson, oral chemo pharmacist, will reach out to physician on how to proceed further.  [place] [facility] [place] Phone [phone] Fax [phone] 05/13/2019 9:51 AM

## 2019-05-08 NOTE — Progress Notes (Addendum)
Hematology and Oncology Follow Up Visit  Javana Schey [id number] 1960-12-16 59 y.o. 05/08/2019   Principle Diagnosis:  TTP - acquired NASH -- leukopenia/thrombocytopenia  Past Therapy: Plasma Exchange - M-Th -- changed to this on 03/26/2019 -- d/c on 04/09/2019  Current Therapy:   Rituxan 336m/m2 IV q week-- start on 05/15/2019   Interim History:  Ms. GGillentineis here today for follow-up. Her ADAMTS 13 activity was < 2.0. Platelet count today is 47. Hgb stable at 10.7, WBC count 1.8.  She noted some mild bleeding from her gums when she brushed her teeth this morning. No other bleeding has been noted. She has 2 small healing bruises on her right shin and minimal petechiae on the palms of her hands.  The Doptelet has not yet been approved she has not started.  She does not like how steroid therapy makes her feel (uneasy and palpitations).  She has had issues with chest aches and palpitations and has an appointment later this afternoon with cardiologist Dr. BGwenlyn Found    She has had a frontal headache and will take Tylenol as needed. This comes and goes.  No fever, chills, n/v, cough, rash, dizziness, SOB, abdominal pain or changes in bowel or bladder habits at this time.  No swelling, tenderness or tingling in her extremities at this time. She has occasional tingling along her spine when she sleeps a certain way.   She has numbness in the right ankle that comes and goes.  No falls or syncopal episodes to report.  She has maintained a good appetite and is staying well hydrated. Her weight is stable.   ECOG Performance Status: 1 - Symptomatic but completely ambulatory  Medications:  Allergies as of 05/08/2019      Reactions   Metformin And Related Itching   Heart palpitation   Sulfa Antibiotics Itching   Sulfasalazine Itching      Medication List       Accurate as of May 08, 2019 12:03 PM. If you have any questions, ask your nurse or doctor.        ALLEGRA-D 24 HOUR PO Take by  mouth.   calcium carbonate 500 MG chewable tablet Commonly known as: TUMS - dosed in mg elemental calcium Chew 2 tablets (400 mg of elemental calcium total) by mouth every 3 (three) hours.   cholecalciferol 25 MCG (1000 UNIT) tablet Commonly known as: VITAMIN D3 Take 1,000 Units by mouth daily.   dextromethorphan-guaiFENesin 30-600 MG 12hr tablet Commonly known as: MUCINEX DM Take 2 tablets by mouth 2 (two) times daily as needed for cough.   diphenhydrAMINE 50 MG capsule Commonly known as: BENADRYL Take 1 capsule (50 mg total) by mouth daily as needed (on call for plasma exchange).   Doptelet 20 MG Tabs Generic drug: Avatrombopag Maleate Take 40 mg by mouth daily.   erythromycin ophthalmic ointment INSTILL A SMALL AMOUNT OF OINTMENT INTO LEFT EYE TWICE DAILY   fluorometholone 0.1 % ophthalmic suspension Commonly known as: FML Place 1 drop into the left eye 4 (four) times daily.   folic acid 1 MG tablet Commonly known as: FOLVITE Take 2 tablets (2 mg total) by mouth daily.   furosemide 40 MG tablet Commonly known as: Lasix Take 1 tablet (40 mg total) by mouth daily.   GAVISCON EXTRA STRENGTH PO Take 1 tablet by mouth 4 (four) times daily as needed.   glucose blood test strip One touch ultra   Multi For Her 50+ Tabs Take 1 tablet by mouth daily.  potassium chloride 10 MEQ tablet Commonly known as: KLOR-CON Take 1 tablet (10 mEq total) by mouth daily.   senna-docusate 8.6-50 MG tablet Commonly known as: Senokot-S Take 2 tablets by mouth 2 (two) times daily.   sitaGLIPtin 100 MG tablet Commonly known as: JANUVIA Take 1 tablet (100 mg total) by mouth daily.   traMADol 50 MG tablet Commonly known as: Ultram Take 1 tablet (50 mg total) by mouth every 6 (six) hours as needed.       Allergies:  Allergies  Allergen Reactions  . Metformin And Related Itching    Heart palpitation  . Sulfa Antibiotics Itching  . Sulfasalazine Itching    Past Medical  History, Surgical history, Social history, and Family History were reviewed and updated.  Review of Systems: All other 10 point review of systems is negative.   Physical Exam:  vitals were not taken for this visit.   Wt Readings from Last 3 Encounters:  05/02/19 149 lb (67.6 kg)  04/23/19 151 lb 12 oz (68.8 kg)  04/09/19 149 lb 12 oz (67.9 kg)    Ocular: Sclerae unicteric, pupils equal, round and reactive to light Ear-nose-throat: Oropharynx clear, dentition fair Lymphatic: No cervical or supraclavicular adenopathy Lungs no rales or rhonchi, good excursion bilaterally Heart regular rate and rhythm, no murmur appreciated Abd soft, nontender, positive bowel sounds, no liver or spleen tip palpated on exam, no fluid wave  MSK no focal spinal tenderness, no joint edema Neuro: non-focal, well-oriented, appropriate affect Breasts: Deferred   Lab Results  Component Value Date   WBC 1.8 (L) 05/08/2019   HGB 10.7 (L) 05/08/2019   HCT 31.8 (L) 05/08/2019   MCV 93.5 05/08/2019   PLT 47 (L) 05/08/2019   Lab Results  Component Value Date   FERRITIN 342 (H) 04/09/2019   IRON 102 04/09/2019   TIBC 289 04/09/2019   UIBC 187 04/09/2019   IRONPCTSAT 35 04/09/2019   Lab Results  Component Value Date   RETICCTPCT 1.8 05/02/2019   RBC 3.40 (L) 05/08/2019   No results found for: KPAFRELGTCHN, LAMBDASER, KAPLAMBRATIO No results found for: IGGSERUM, IGA, IGMSERUM No results found for: Odetta Pink, SPEI   Chemistry      Component Value Date/Time   NA 138 05/08/2019 1115   K 3.7 05/08/2019 1115   CL 104 05/08/2019 1115   CO2 28 05/08/2019 1115   BUN 6 05/08/2019 1115   CREATININE 0.72 05/08/2019 1115   CREATININE 0.70 09/25/2015 1504      Component Value Date/Time   CALCIUM 10.1 05/08/2019 1115   ALKPHOS 87 05/08/2019 1115   AST 53 (H) 05/08/2019 1115   ALT 32 05/08/2019 1115   BILITOT 0.9 05/08/2019 1115        Impression and Plan: Ms. Dedmon is a very pleasant 59 yo African American female with acquired TTP with NASH.  Her ADAMS 13 activity level was < 2 earlier this week and platelet count is 47.  I spoke with Dr. Marin Olp and we will get her started on weekly Rituxan therapy for 4 weeks.  I went over the drug information and potential side effects with patient and also sent her home with a printout for reference.  She is in agreement with the plan and will contact our office with any questions or concerns. We will plan to see her for follow-up again in 2 weeks but can certainly see her sooner if needed.   Laverna Peace, NP 3/3/202112:03 PM  ADDENDUM: I saw Ms. Leis.  I am worried about the ADAMTS-13 level being less than 2%.  It was this low when she was in the hospital and we had to do plasma exchange.  Now realizes some of the low level of ADAMTS-13 could be from her cirrhosis.  I do think that it would be reasonable to start her on Rituxan.  I think we have had some good success with Rituxan in trying to decrease the antibody directed against ADAMTS-13.  There have been a lot of studies I have looked at Rituxan in this situation.  I think it is reasonable.  I do not see any real downside to Rituxan with Ms. Delpozo.  We talked Ms. Hoerner about all of this.  We talked about the side effects.  I would do Rituxan weekly.  I would release 4 weeks.  Again, since she has cirrhosis, she will always have an element of leukopenia and thrombocytopenia.  I do still want to see things worsen.  I think with Ms. Gambrel, her reticulocyte count is helpful for assessing TTP.  LDH also think would be helpful for assessing TTP activity.  We spent about 45-50 minutes with her today.  This was somewhat complicated since we might be seen a change in her clinical status.  We had to enter the Rituxan protocol into the system.  We will start next week with weekly Rituxan.  We will then plan to get her back  to see [place] probably in about 3 to 4 weeks.  Lattie Haw, MD

## 2019-05-08 NOTE — Assessment & Plan Note (Signed)
History of palpitations in the past probably related to PVCs.  She was drinking more caffeine at that time but not so much now.  Her palpitations are much less frequent and severe.

## 2019-05-08 NOTE — Progress Notes (Signed)
05/08/2019 Cira Wint   [birth year]  [id number]  Primary Physician Lesleigh Noe, MD Primary Cardiologist: Lorretta Harp MD Lupe Carney, [place]  HPI:  Vicki [name] is a 59 y.o.  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 Maudie Mercury for cardiac evaluation because of new onset palpitations. I last saw her in the office  12/30/2016.. Factors include treated hypertension and diabetes. Her mother did have a myocardial infarction at age 39. She has never had a heart attack or stroke and denies chest pain or shortness of breath. She is in her perimenopause time window is followed by Dr. Dellis Filbert. She drinks 2 cups of caffeinated beverages a day as well as alcohol on the weekends. She is not under a lot of stress recently. A 2-D echocardiogram was normal and an event monitor showed sinus rhythm sinus/sinus tachycardia and frequent PVCs. She had decreased her caffeine intake which improved her symptoms a year ago. However recently, she's noticed increased heart rate and palpitations are somewhat different quality than her previous symptoms. She also was diagnosed with NASH.  Since I saw her last she has been diagnosed with TTP and has undergone plasmapheresis.  She is drinking less caffeine and alcohol since I saw her last her palpitations are much less frequent and noticeable.  She had some atypical chest pain when they removed her indwelling catheter but none since.   Current Meds  Medication Sig  . Alum Hydroxide-Mag Carbonate (GAVISCON EXTRA STRENGTH PO) Take 1 tablet by mouth 4 (four) times daily as needed.  . calcium carbonate (TUMS - DOSED IN MG ELEMENTAL CALCIUM) 500 MG chewable tablet Chew 2 tablets (400 mg of elemental calcium total) by mouth every 3 (three) hours.  . cholecalciferol (VITAMIN D3) 25 MCG (1000 UT) tablet Take 1,000 Units by mouth daily.  [name] dextromethorphan-guaiFENesin (MUCINEX DM) 30-600 MG 12hr  tablet Take 2 tablets by mouth 2 (two) times daily as needed for cough.  . diphenhydrAMINE (BENADRYL) 50 MG capsule Take 1 capsule (50 mg total) by mouth daily as needed (on call for plasma exchange).  [name] Fexofenadine-Pseudoephedrine (ALLEGRA-D 24 HOUR PO) Take by mouth.  . fluorometholone (FML) 0.1 % ophthalmic suspension Place 1 drop into the left eye 4 (four) times daily.   . folic acid (FOLVITE) 1 MG tablet Take 2 tablets (2 mg total) by mouth daily.  . furosemide (LASIX) 40 MG tablet Take 1 tablet (40 mg total) by mouth daily.  [name] glucose blood test strip One touch ultra  . Multiple Vitamins-Minerals (MULTI FOR HER 50+) TABS Take 1 tablet by mouth daily.  . potassium chloride (KLOR-CON) 10 MEQ tablet Take 1 tablet (10 mEq total) by mouth daily.  [name] senna-docusate (SENOKOT-S) 8.6-50 MG tablet Take 2 tablets by mouth 2 (two) times daily.  . sitaGLIPtin (JANUVIA) 100 MG tablet Take 1 tablet (100 mg total) by mouth daily.  . traMADol (ULTRAM) 50 MG tablet Take 1 tablet (50 mg total) by mouth every 6 (six) hours as needed.  . [DISCONTINUED] Avatrombopag Maleate (DOPTELET) 20 MG TABS Take 40 mg by mouth daily.  . [DISCONTINUED] erythromycin ophthalmic ointment INSTILL A SMALL AMOUNT OF OINTMENT INTO LEFT EYE TWICE DAILY     Allergies  Allergen Reactions  . Metformin And Related Itching    Heart palpitation  . Sulfa Antibiotics Itching  . Sulfasalazine Itching    Social History   Socioeconomic History  . Marital status:  Single    Spouse name: Not on file  . Number of children: 1  . Years of education: Not on file  . Highest education level: Some college, no degree  Occupational History    Employer: [employer]  Tobacco Use  . Smoking status: Never Smoker  . Smokeless tobacco: Never Used  Substance and Sexual Activity  . Alcohol use: Yes    Alcohol/week: 3.0 standard drinks    Types: 3 Standard drinks or equivalent per week    Comment: trying to reduce  . Drug use: No  . Sexual activity:  Not Currently    Birth control/protection: Condom  Other Topics Concern  . 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 Difficulty of Paying Living Expenses: Not on file  Food Insecurity:   . Worried About Charity fundraiser in the Last Year: Not on file  . Ran Out of Food in the Last Year: Not on file  Transportation Needs:   . Lack of Transportation (Medical): Not on file  . Lack of Transportation (Non-Medical): Not on file  Physical Activity:   . Days of Exercise per Week: Not on file  . Minutes of Exercise per Session: Not on file  Stress:   . Feeling of Stress : Not on file  Social Connections:   . Frequency of Communication with Friends and Family: Not on file  . Frequency of Social Gatherings with Friends and Family: Not on file  . Attends Religious Services: Not on file  . Active Member of Clubs or Organizations: Not on file  . Attends Archivist Meetings: Not on file  . Marital Status: Not on file  Intimate Partner Violence:   . Fear of Current or Ex-Partner: Not on file  . Emotionally Abused: Not on file  . Physically Abused: Not on file  . Sexually Abused: Not on file     Review of Systems: General: negative for chills, fever, night sweats or weight changes.  Cardiovascular: negative for chest pain, dyspnea on exertion, edema, orthopnea, palpitations, paroxysmal nocturnal dyspnea or shortness of breath Dermatological: negative for rash Respiratory: negative for cough or wheezing Urologic: negative for  hematuria Abdominal: negative for nausea, vomiting, diarrhea, bright red blood per rectum, melena, or hematemesis Neurologic: negative for visual changes, syncope, or dizziness All other systems reviewed and are otherwise negative except as noted above.    Blood pressure 128/76, pulse 85, temperature 98.5 F (36.9 C), height 5' 1"  (1.549 m), weight 152 lb 12.8 oz (69.3 kg), last menstrual period 04/30/2012, SpO2 100 %.  General appearance: alert and no distress Neck: no adenopathy, no carotid bruit, no JVD, supple, symmetrical, trachea midline and thyroid not enlarged, symmetric, no tenderness/mass/nodules Lungs: clear to auscultation bilaterally Heart: 1-2 over 6 outflow tract murmur consistent with aortic sclerosis and/or stenosis. Extremities: extremities normal, atraumatic, no  cyanosis or edema Pulses: 2+ and symmetric Skin: Skin color, texture, turgor normal. No rashes or lesions Neurologic: Alert and oriented X 3, normal strength and tone. Normal symmetric reflexes. Normal coordination and gait  EKG not performed today  ASSESSMENT AND PLAN:   Palpitations History of palpitations in the past probably related to PVCs.  She was drinking more caffeine at that time but not so much now.  Her palpitations are much less frequent and severe.  Cardiac murmur 2/6 outflow tract murmur with a normal 04/24/2015.  We will repeat a 2D echocardiogram.      Lorretta Harp MD [facility], [facility] 05/08/2019 4:14 PM

## 2019-05-08 NOTE — Progress Notes (Signed)
START OFF PATHWAY REGIMEN - Lymphoma and CLL   [id number]:Rituximab (IV/Subcut) D1 Weekly:   A cycle is every 7 days:     Rituximab-xxxx      Rituximab and hyaluronidase human   **Always confirm dose/schedule in your pharmacy ordering system**  Patient Characteristics: [place] Macroglobulinemia/Lymphoplasmacytic Lymphoma, Asymptomatic Disease Type: [place] Macroglobulinemia/Lymphoplasmacytic Lymphoma Disease Type: Not Applicable Disease Type: Not Applicable Asymptomatic or Symptomatic<= Asymptomatic Intent of Therapy: Curative Intent, Discussed with Patient

## 2019-05-08 NOTE — Assessment & Plan Note (Signed)
2/6 outflow tract murmur with a normal 04/24/2015.  We will repeat a 2D echocardiogram.

## 2019-05-09 ENCOUNTER — Telehealth: Payer: Self-pay | Admitting: *Deleted

## 2019-05-09 NOTE — Addendum Note (Signed)
Addended by: Burney Gauze R on: 05/09/2019 07:07 AM   Modules accepted: Level of Service

## 2019-05-09 NOTE — Telephone Encounter (Signed)
Patient called asking how to prepare for her visit on 3/10.  Reviewed the infusion room procedure and what t to bring to her appointment.  Left on voicemail as per patient instruction.

## 2019-05-13 ENCOUNTER — Other Ambulatory Visit: Payer: Federal, State, Local not specified - PPO

## 2019-05-13 ENCOUNTER — Ambulatory Visit: Payer: Federal, State, Local not specified - PPO | Admitting: Hematology

## 2019-05-13 NOTE — Telephone Encounter (Signed)
Per MD office, patient is not going to be taking Doptelet.

## 2019-05-14 ENCOUNTER — Telehealth: Payer: Self-pay | Admitting: *Deleted

## 2019-05-14 LAB — ADAMTS13 ANTIBODY: ADAMTS13 Antibody: 37 Units/mL — ABNORMAL HIGH (ref <12)

## 2019-05-14 LAB — ADAMTS13 ACTIVITY: Adamts 13 Activity: <2 % — CL (ref >66.8)

## 2019-05-14 NOTE — Telephone Encounter (Signed)
Patient advised and appointment made for 05/16/19  Also patient wanted to let Dr Einar Pheasant know that her sugars are still elevated after starting Januvia but better. Readings have been fasting as follows: 189, 155, 173, 153, 182, 177. Does patient need to make any other medication changes at this time?

## 2019-05-14 NOTE — Telephone Encounter (Signed)
Patient called stating that she has another bump in her pubic area..  Patient stated that this is the third one that she has had since starting to see Dr. Einar Pheasant. Patient is wondering if she needs an antibiotic? Patient stated that she is scheduled for a Rituxon treatment tomorrow with Dr. Marin Olp. Patient stated that the bump in her pubic hair area. Patient stated that the bump hurts. Patient stated that she can not tell if it is red because of the hair that is around it. Pharmacy [facility]

## 2019-05-14 NOTE — Telephone Encounter (Signed)
Recommend appointment for re-evaluation. Previous lesions were in the upper thigh. If this is in the pubic area and she prefers to see a GYN I can place a referral. But she needs re-evaluation.

## 2019-05-14 NOTE — Telephone Encounter (Signed)
Can discuss at appointment.

## 2019-05-15 ENCOUNTER — Other Ambulatory Visit: Payer: Self-pay

## 2019-05-15 ENCOUNTER — Inpatient Hospital Stay: Payer: Federal, State, Local not specified - PPO

## 2019-05-15 ENCOUNTER — Other Ambulatory Visit: Payer: Self-pay | Admitting: *Deleted

## 2019-05-15 VITALS — BP 139/68 | HR 101 | Temp 98.9°F | Resp 108

## 2019-05-15 DIAGNOSIS — Z7984 Long term (current) use of oral hypoglycemic drugs: Secondary | ICD-10-CM | POA: Diagnosis not present

## 2019-05-15 DIAGNOSIS — Z20822 Contact with and (suspected) exposure to covid-19: Secondary | ICD-10-CM | POA: Diagnosis not present

## 2019-05-15 DIAGNOSIS — Z8249 Family history of ischemic heart disease and other diseases of the circulatory system: Secondary | ICD-10-CM | POA: Diagnosis not present

## 2019-05-15 DIAGNOSIS — E876 Hypokalemia: Secondary | ICD-10-CM | POA: Diagnosis not present

## 2019-05-15 DIAGNOSIS — Z8349 Family history of other endocrine, nutritional and metabolic diseases: Secondary | ICD-10-CM | POA: Diagnosis not present

## 2019-05-15 DIAGNOSIS — R6889 Other general symptoms and signs: Secondary | ICD-10-CM

## 2019-05-15 DIAGNOSIS — K7581 Nonalcoholic steatohepatitis (NASH): Secondary | ICD-10-CM | POA: Diagnosis not present

## 2019-05-15 DIAGNOSIS — E119 Type 2 diabetes mellitus without complications: Secondary | ICD-10-CM | POA: Diagnosis not present

## 2019-05-15 DIAGNOSIS — K7460 Unspecified cirrhosis of liver: Secondary | ICD-10-CM | POA: Diagnosis not present

## 2019-05-15 DIAGNOSIS — D72819 Decreased white blood cell count, unspecified: Secondary | ICD-10-CM | POA: Diagnosis not present

## 2019-05-15 DIAGNOSIS — M311 Thrombotic microangiopathy: Secondary | ICD-10-CM

## 2019-05-15 DIAGNOSIS — Z833 Family history of diabetes mellitus: Secondary | ICD-10-CM | POA: Diagnosis not present

## 2019-05-15 DIAGNOSIS — E785 Hyperlipidemia, unspecified: Secondary | ICD-10-CM | POA: Diagnosis not present

## 2019-05-15 DIAGNOSIS — Z9221 Personal history of antineoplastic chemotherapy: Secondary | ICD-10-CM | POA: Diagnosis not present

## 2019-05-15 DIAGNOSIS — L03114 Cellulitis of left upper limb: Secondary | ICD-10-CM | POA: Diagnosis not present

## 2019-05-15 DIAGNOSIS — M3119 Other thrombotic microangiopathy: Secondary | ICD-10-CM

## 2019-05-15 DIAGNOSIS — R509 Fever, unspecified: Secondary | ICD-10-CM | POA: Diagnosis not present

## 2019-05-15 DIAGNOSIS — I10 Essential (primary) hypertension: Secondary | ICD-10-CM | POA: Diagnosis not present

## 2019-05-15 DIAGNOSIS — R21 Rash and other nonspecific skin eruption: Secondary | ICD-10-CM | POA: Diagnosis not present

## 2019-05-15 DIAGNOSIS — Z79899 Other long term (current) drug therapy: Secondary | ICD-10-CM | POA: Diagnosis not present

## 2019-05-15 LAB — CMP (CANCER CENTER ONLY)
ALT: 32 U/L (ref 0–44)
AST: 53 U/L — ABNORMAL HIGH (ref 15–41)
Albumin: 3.8 g/dL (ref 3.5–5.0)
Alkaline Phosphatase: 106 U/L (ref 38–126)
Anion gap: 6 (ref 5–15)
BUN: 11 mg/dL (ref 6–20)
CO2: 28 mmol/L (ref 22–32)
Calcium: 10.1 mg/dL (ref 8.9–10.3)
Chloride: 103 mmol/L (ref 98–111)
Creatinine: 0.81 mg/dL (ref 0.44–1.00)
GFR, Est AFR Am: >60 mL/min (ref >60)
GFR, Estimated: >60 mL/min (ref >60)
Glucose, Bld: 225 mg/dL — ABNORMAL HIGH (ref 70–99)
Potassium: 4 mmol/L (ref 3.5–5.1)
Sodium: 137 mmol/L (ref 135–145)
Total Bilirubin: 0.7 mg/dL (ref 0.3–1.2)
Total Protein: 8 g/dL (ref 6.5–8.1)

## 2019-05-15 LAB — CBC WITH DIFFERENTIAL (CANCER CENTER ONLY)
Abs Immature Granulocytes: 0.01 10*3/uL (ref 0.00–0.07)
Basophils Absolute: 0 10*3/uL (ref 0.0–0.1)
Basophils Relative: 1 %
Eosinophils Absolute: 0.1 10*3/uL (ref 0.0–0.5)
Eosinophils Relative: 7 %
HCT: 31.1 % — ABNORMAL LOW (ref 36.0–46.0)
Hemoglobin: 10.4 g/dL — ABNORMAL LOW (ref 12.0–15.0)
Immature Granulocytes: 1 %
Lymphocytes Relative: 37 %
Lymphs Abs: 0.7 10*3/uL (ref 0.7–4.0)
MCH: 31.1 pg (ref 26.0–34.0)
MCHC: 33.4 g/dL (ref 30.0–36.0)
MCV: 93.1 fL (ref 80.0–100.0)
Monocytes Absolute: 0.2 10*3/uL (ref 0.1–1.0)
Monocytes Relative: 10 %
Neutro Abs: 0.8 10*3/uL — ABNORMAL LOW (ref 1.7–7.7)
Neutrophils Relative %: 44 %
Platelet Count: 53 10*3/uL — ABNORMAL LOW (ref 150–400)
RBC: 3.34 MIL/uL — ABNORMAL LOW (ref 3.87–5.11)
RDW: 12.1 % (ref 11.5–15.5)
WBC Count: 1.9 10*3/uL — ABNORMAL LOW (ref 4.0–10.5)
nRBC: 0 % (ref 0.0–0.2)

## 2019-05-15 LAB — SAVE SMEAR(SSMR), FOR PROVIDER SLIDE REVIEW

## 2019-05-15 MED ORDER — SODIUM CHLORIDE 0.9 % IV SOLN
375.0000 mg/m2 | Freq: Once | INTRAVENOUS | Status: AC
  Administered 2019-05-15: 600 mg via INTRAVENOUS
  Filled 2019-05-15: qty 50

## 2019-05-15 MED ORDER — LIDOCAINE-PRILOCAINE 2.5-2.5 % EX CREA: TOPICAL_CREAM | CUTANEOUS | 3 refills | Status: DC

## 2019-05-15 MED ORDER — MEPERIDINE HCL 25 MG/ML IJ SOLN
INTRAMUSCULAR | Status: AC
  Filled 2019-05-15: qty 1

## 2019-05-15 MED ORDER — DIPHENHYDRAMINE HCL 25 MG PO CAPS
ORAL_CAPSULE | ORAL | Status: AC
  Filled 2019-05-15: qty 2

## 2019-05-15 MED ORDER — MEPERIDINE HCL 25 MG/ML IJ SOLN
12.5000 mg | Freq: Once | INTRAMUSCULAR | Status: AC
  Administered 2019-05-15: 12.5 mg via INTRAVENOUS

## 2019-05-15 MED ORDER — SODIUM CHLORIDE 0.9 % IV SOLN
Freq: Once | INTRAVENOUS | Status: AC
  Filled 2019-05-15: qty 250

## 2019-05-15 MED ORDER — ACETAMINOPHEN 325 MG PO TABS
ORAL_TABLET | ORAL | Status: AC
  Filled 2019-05-15: qty 2

## 2019-05-15 MED ORDER — MEPERIDINE HCL 25 MG/ML IJ SOLN: 12.5000 mg | Freq: Once | INTRAMUSCULAR | Status: DC

## 2019-05-15 MED ORDER — DIPHENHYDRAMINE HCL 25 MG PO CAPS
50.0000 mg | ORAL_CAPSULE | Freq: Once | ORAL | Status: AC
  Administered 2019-05-15: 50 mg via ORAL

## 2019-05-15 MED ORDER — ACETAMINOPHEN 325 MG PO TABS
650.0000 mg | ORAL_TABLET | Freq: Once | ORAL | Status: AC
  Administered 2019-05-15: 650 mg via ORAL

## 2019-05-15 NOTE — Telephone Encounter (Signed)
Will discuss tomorrow during OV 05/16/19

## 2019-05-15 NOTE — Telephone Encounter (Signed)
Left message for patient to call back to let her know

## 2019-05-15 NOTE — Patient Instructions (Addendum)
[place] [facility] Discharge Instructions for Patients Receiving Chemotherapy  Today you received the following chemotherapy agents Rituxan.  To help prevent nausea and vomiting after your treatment, we encourage you to take your nausea medication as indicated by your MD.   If you develop nausea and vomiting that is not controlled by your nausea medication, call the clinic.   BELOW ARE SYMPTOMS THAT SHOULD BE REPORTED IMMEDIATELY:  *FEVER GREATER THAN 100.5 F  *CHILLS WITH OR WITHOUT FEVER  NAUSEA AND VOMITING THAT IS NOT CONTROLLED WITH YOUR NAUSEA MEDICATION  *UNUSUAL SHORTNESS OF BREATH  *UNUSUAL BRUISING OR BLEEDING  TENDERNESS IN MOUTH AND THROAT WITH OR WITHOUT PRESENCE OF ULCERS  *URINARY PROBLEMS  *BOWEL PROBLEMS  UNUSUAL RASH Items with * indicate a potential emergency and should be followed up as soon as possible.  Feel free to call the clinic should you have any questions or concerns. The clinic phone number is [phone].  Please show the [place] at check-in to the Emergency Department and triage nurse.

## 2019-05-16 ENCOUNTER — Other Ambulatory Visit: Payer: Self-pay

## 2019-05-16 ENCOUNTER — Emergency Department (HOSPITAL_BASED_OUTPATIENT_CLINIC_OR_DEPARTMENT_OTHER): Payer: Federal, State, Local not specified - PPO

## 2019-05-16 ENCOUNTER — Ambulatory Visit: Payer: Federal, State, Local not specified - PPO | Admitting: Family Medicine

## 2019-05-16 ENCOUNTER — Encounter (HOSPITAL_BASED_OUTPATIENT_CLINIC_OR_DEPARTMENT_OTHER): Payer: Self-pay | Admitting: Emergency Medicine

## 2019-05-16 ENCOUNTER — Inpatient Hospital Stay (HOSPITAL_BASED_OUTPATIENT_CLINIC_OR_DEPARTMENT_OTHER)
Admission: EM | Admit: 2019-05-16 | Discharge: 2019-05-24 | DRG: 546 | Disposition: A | Discharge status: Home / Self Care | Payer: Federal, State, Local not specified - PPO | Attending: Internal Medicine | Admitting: Internal Medicine

## 2019-05-16 DIAGNOSIS — K1379 Other lesions of oral mucosa: Secondary | ICD-10-CM

## 2019-05-16 DIAGNOSIS — Z833 Family history of diabetes mellitus: Secondary | ICD-10-CM | POA: Diagnosis not present

## 2019-05-16 DIAGNOSIS — K7581 Nonalcoholic steatohepatitis (NASH): Secondary | ICD-10-CM | POA: Diagnosis present

## 2019-05-16 DIAGNOSIS — M311 Thrombotic microangiopathy: Secondary | ICD-10-CM | POA: Diagnosis not present

## 2019-05-16 DIAGNOSIS — Z8249 Family history of ischemic heart disease and other diseases of the circulatory system: Secondary | ICD-10-CM

## 2019-05-16 DIAGNOSIS — E876 Hypokalemia: Secondary | ICD-10-CM | POA: Diagnosis present

## 2019-05-16 DIAGNOSIS — I959 Hypotension, unspecified: Secondary | ICD-10-CM | POA: Diagnosis not present

## 2019-05-16 DIAGNOSIS — E119 Type 2 diabetes mellitus without complications: Secondary | ICD-10-CM | POA: Diagnosis present

## 2019-05-16 DIAGNOSIS — R21 Rash and other nonspecific skin eruption: Secondary | ICD-10-CM | POA: Diagnosis not present

## 2019-05-16 DIAGNOSIS — Z79899 Other long term (current) drug therapy: Secondary | ICD-10-CM

## 2019-05-16 DIAGNOSIS — Z9289 Personal history of other medical treatment: Secondary | ICD-10-CM

## 2019-05-16 DIAGNOSIS — Z20822 Contact with and (suspected) exposure to covid-19: Secondary | ICD-10-CM | POA: Diagnosis present

## 2019-05-16 DIAGNOSIS — Z8349 Family history of other endocrine, nutritional and metabolic diseases: Secondary | ICD-10-CM | POA: Diagnosis not present

## 2019-05-16 DIAGNOSIS — R739 Hyperglycemia, unspecified: Secondary | ICD-10-CM

## 2019-05-16 DIAGNOSIS — K7469 Other cirrhosis of liver: Secondary | ICD-10-CM | POA: Diagnosis present

## 2019-05-16 DIAGNOSIS — K7460 Unspecified cirrhosis of liver: Secondary | ICD-10-CM | POA: Diagnosis present

## 2019-05-16 DIAGNOSIS — I10 Essential (primary) hypertension: Secondary | ICD-10-CM | POA: Diagnosis present

## 2019-05-16 DIAGNOSIS — R509 Fever, unspecified: Secondary | ICD-10-CM

## 2019-05-16 DIAGNOSIS — E785 Hyperlipidemia, unspecified: Secondary | ICD-10-CM | POA: Diagnosis present

## 2019-05-16 DIAGNOSIS — Z4901 Encounter for fitting and adjustment of extracorporeal dialysis catheter: Secondary | ICD-10-CM | POA: Diagnosis not present

## 2019-05-16 DIAGNOSIS — E118 Type 2 diabetes mellitus with unspecified complications: Secondary | ICD-10-CM

## 2019-05-16 DIAGNOSIS — L03114 Cellulitis of left upper limb: Secondary | ICD-10-CM | POA: Diagnosis present

## 2019-05-16 DIAGNOSIS — R062 Wheezing: Secondary | ICD-10-CM | POA: Diagnosis not present

## 2019-05-16 DIAGNOSIS — Z9221 Personal history of antineoplastic chemotherapy: Secondary | ICD-10-CM | POA: Diagnosis not present

## 2019-05-16 DIAGNOSIS — Z7984 Long term (current) use of oral hypoglycemic drugs: Secondary | ICD-10-CM | POA: Diagnosis not present

## 2019-05-16 DIAGNOSIS — D72819 Decreased white blood cell count, unspecified: Secondary | ICD-10-CM | POA: Diagnosis present

## 2019-05-16 DIAGNOSIS — L0390 Cellulitis, unspecified: Secondary | ICD-10-CM

## 2019-05-16 DIAGNOSIS — D61818 Other pancytopenia: Secondary | ICD-10-CM | POA: Diagnosis not present

## 2019-05-16 DIAGNOSIS — M3119 Other thrombotic microangiopathy: Secondary | ICD-10-CM | POA: Diagnosis present

## 2019-05-16 HISTORY — DX: Malignant (primary) neoplasm, unspecified: C80.1

## 2019-05-16 LAB — CBC WITH DIFFERENTIAL/PLATELET
Abs Immature Granulocytes: 0 10*3/uL (ref 0.00–0.07)
Basophils Absolute: 0 10*3/uL (ref 0.0–0.1)
Basophils Relative: 1 %
Eosinophils Absolute: 0.1 10*3/uL (ref 0.0–0.5)
Eosinophils Relative: 6 %
HCT: 29.5 % — ABNORMAL LOW (ref 36.0–46.0)
Hemoglobin: 10.2 g/dL — ABNORMAL LOW (ref 12.0–15.0)
Immature Granulocytes: 0 %
Lymphocytes Relative: 14 %
Lymphs Abs: 0.2 10*3/uL — ABNORMAL LOW (ref 0.7–4.0)
MCH: 32 pg (ref 26.0–34.0)
MCHC: 34.6 g/dL (ref 30.0–36.0)
MCV: 92.5 fL (ref 80.0–100.0)
Monocytes Absolute: 0.1 10*3/uL (ref 0.1–1.0)
Monocytes Relative: 8 %
Neutro Abs: 1.1 10*3/uL — ABNORMAL LOW (ref 1.7–7.7)
Neutrophils Relative %: 71 %
Platelets: 13 10*3/uL — CL (ref 150–400)
RBC: 3.19 MIL/uL — ABNORMAL LOW (ref 3.87–5.11)
RDW: 13 % (ref 11.5–15.5)
Smear Review: NORMAL
WBC: 1.6 10*3/uL — ABNORMAL LOW (ref 4.0–10.5)
nRBC: 0 % (ref 0.0–0.2)

## 2019-05-16 LAB — PROCALCITONIN: Procalcitonin: 0.76 ng/mL

## 2019-05-16 LAB — COMPREHENSIVE METABOLIC PANEL
ALT: 37 U/L (ref 0–44)
AST: 70 U/L — ABNORMAL HIGH (ref 15–41)
Albumin: 3.5 g/dL (ref 3.5–5.0)
Alkaline Phosphatase: 99 U/L (ref 38–126)
Anion gap: 6 (ref 5–15)
BUN: 10 mg/dL (ref 6–20)
CO2: 25 mmol/L (ref 22–32)
Calcium: 9.1 mg/dL (ref 8.9–10.3)
Chloride: 102 mmol/L (ref 98–111)
Creatinine, Ser: 0.7 mg/dL (ref 0.44–1.00)
GFR calc Af Amer: >60 mL/min (ref >60)
GFR calc non Af Amer: >60 mL/min (ref >60)
Glucose, Bld: 251 mg/dL — ABNORMAL HIGH (ref 70–99)
Potassium: 3.5 mmol/L (ref 3.5–5.1)
Sodium: 133 mmol/L — ABNORMAL LOW (ref 135–145)
Total Bilirubin: 1.8 mg/dL — ABNORMAL HIGH (ref 0.3–1.2)
Total Protein: 7.8 g/dL (ref 6.5–8.1)

## 2019-05-16 LAB — URINALYSIS, ROUTINE W REFLEX MICROSCOPIC
Bilirubin Urine: NEGATIVE
Glucose, UA: NEGATIVE mg/dL
Hgb urine dipstick: NEGATIVE
Ketones, ur: NEGATIVE mg/dL
Leukocytes,Ua: NEGATIVE
Nitrite: NEGATIVE
Protein, ur: NEGATIVE mg/dL
Specific Gravity, Urine: 1.02 (ref 1.005–1.030)
pH: 5.5 (ref 5.0–8.0)

## 2019-05-16 LAB — GLUCOSE, CAPILLARY
Glucose-Capillary: 166 mg/dL — ABNORMAL HIGH (ref 70–99)
Glucose-Capillary: 205 mg/dL — ABNORMAL HIGH (ref 70–99)

## 2019-05-16 LAB — RESPIRATORY PANEL BY RT PCR (FLU A&B, COVID)
Influenza A by PCR: NEGATIVE
Influenza B by PCR: NEGATIVE
SARS Coronavirus 2 by RT PCR: NEGATIVE

## 2019-05-16 LAB — HEMOGLOBIN A1C
Hgb A1c MFr Bld: 6.8 % — ABNORMAL HIGH (ref 4.8–5.6)
Mean Plasma Glucose: 148.46 mg/dL

## 2019-05-16 LAB — LACTIC ACID, PLASMA: Lactic Acid, Venous: 1.1 mmol/L (ref 0.5–1.9)

## 2019-05-16 MED ORDER — CEPHALEXIN 500 MG PO CAPS
500.0000 mg | ORAL_CAPSULE | Freq: Four times a day (QID) | ORAL | Status: AC
  Administered 2019-05-16 – 2019-05-21 (×17): 500 mg via ORAL
  Filled 2019-05-16 (×18): qty 1

## 2019-05-16 MED ORDER — FOLIC ACID 1 MG PO TABS
2.0000 mg | ORAL_TABLET | Freq: Every day | ORAL | Status: DC
  Administered 2019-05-17 – 2019-05-24 (×8): 2 mg via ORAL
  Filled 2019-05-16 (×8): qty 2

## 2019-05-16 MED ORDER — LINAGLIPTIN 5 MG PO TABS: 5.0000 mg | ORAL_TABLET | Freq: Every day | ORAL | Status: DC

## 2019-05-16 MED ORDER — ACETAMINOPHEN 650 MG RE SUPP: 650.0000 mg | Freq: Four times a day (QID) | RECTAL | Status: DC | PRN

## 2019-05-16 MED ORDER — LINAGLIPTIN 5 MG PO TABS
5.0000 mg | ORAL_TABLET | Freq: Every day | ORAL | Status: DC
  Administered 2019-05-17 – 2019-05-18 (×2): 5 mg via ORAL
  Filled 2019-05-16 (×3): qty 1

## 2019-05-16 MED ORDER — GLIMEPIRIDE 4 MG PO TABS: 4.0000 mg | ORAL_TABLET | Freq: Every day | ORAL | Status: DC

## 2019-05-16 MED ORDER — ACETAMINOPHEN 325 MG PO TABS
650.0000 mg | ORAL_TABLET | Freq: Four times a day (QID) | ORAL | Status: DC | PRN
  Administered 2019-05-18 – 2019-05-23 (×5): 650 mg via ORAL
  Filled 2019-05-16 (×7): qty 2

## 2019-05-16 MED ORDER — FUROSEMIDE 40 MG PO TABS
40.0000 mg | ORAL_TABLET | Freq: Every day | ORAL | Status: DC
  Administered 2019-05-16 – 2019-05-17 (×2): 40 mg via ORAL
  Filled 2019-05-16 (×2): qty 1

## 2019-05-16 MED ORDER — ACETAMINOPHEN 325 MG PO TABS
650.0000 mg | ORAL_TABLET | Freq: Once | ORAL | Status: AC
  Administered 2019-05-16: 650 mg via ORAL
  Filled 2019-05-16: qty 2

## 2019-05-16 MED ORDER — INSULIN ASPART 100 UNIT/ML ~~LOC~~ SOLN
0.0000 [IU] | Freq: Three times a day (TID) | SUBCUTANEOUS | Status: DC
  Administered 2019-05-16: 3 [IU] via SUBCUTANEOUS
  Administered 2019-05-17 – 2019-05-18 (×4): 5 [IU] via SUBCUTANEOUS
  Administered 2019-05-18: 3 [IU] via SUBCUTANEOUS
  Administered 2019-05-19: 5 [IU] via SUBCUTANEOUS
  Administered 2019-05-19: 2 [IU] via SUBCUTANEOUS
  Administered 2019-05-19: 3 [IU] via SUBCUTANEOUS
  Administered 2019-05-20: 2 [IU] via SUBCUTANEOUS
  Administered 2019-05-20 (×2): 3 [IU] via SUBCUTANEOUS
  Administered 2019-05-21 (×2): 2 [IU] via SUBCUTANEOUS
  Administered 2019-05-22: 3 [IU] via SUBCUTANEOUS
  Administered 2019-05-22: 2 [IU] via SUBCUTANEOUS
  Administered 2019-05-23 (×2): 3 [IU] via SUBCUTANEOUS
  Administered 2019-05-24: 2 [IU] via SUBCUTANEOUS
  Administered 2019-05-24: 3 [IU] via SUBCUTANEOUS

## 2019-05-16 MED ORDER — WHITE PETROLATUM EX OINT
TOPICAL_OINTMENT | CUTANEOUS | Status: AC
  Filled 2019-05-16: qty 28.35

## 2019-05-16 NOTE — H&P (Addendum)
[facility] History and Physical  Vicki [name] GUR:[id number] DOB: March 04, 1961 DOA: 05/16/2019  Referring provider: Melina Copa, MD PCP: Lesleigh Noe, MD   Chief Complaint: Rash, Fever  HPI: Vicki [name] is a 59 y.o. 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 Patient reports first episode of TTP in December. Yesterday she had her first infusion of rituximab. Reports feeling well but then noted rash on left arm and a fever today and was advised to come in for evaluation. Denies any other complaints. Denies recent injury to left arm. Denies any new medications other than rituximab yesterday. She has not taken anything for the fever. Denies problems eating or drinking. Denies headache, dizziness, chills, cough, SOB, chest pain, abdominal pain, nausea, vomiting, diarrhea, constipation, dysuria, hematuria, hematochezia, melena, difficulty moving arms/legs, speech difficulty, trouble eating, confusion or any other complaints.  In the [facility] ED: Patient febrile to 100.69F. BP's mildly elevated. Vitals otherwise stable. CBC with Plt 13, Hgb 10.2, WBC 1.6. CMP WNL. Lactate WNL. UA without evidence of infection. Blood cx drawn.  CXR without acute cardiopulmonary abnormality.  Patient was given Tylenol and Oncology/Hematology consulted and patient transferred to [facility] for admission.   Review of Systems:  All other systems negative unless noted above in HPI.   Past Medical History:  Diagnosis Date  . Allergy   . Arthritis   . Boil   . Cancer ([place])    throbotic thrombocytopenic purpura  . 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 Diabetes ([place])    type 2  . Elevated liver enzymes   . Fatty liver   . Hyperlipemia   . Hypertension   . 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 PVC's (premature ventricular contractions)   . TTP (thrombotic thrombocytopenic purpura) ([place]) 03/19/2019   Past  Surgical History:  Procedure Laterality Date  . COLONOSCOPY  2014  . ECTOPIC PREGNANCY SURGERY  1990  . IR FLUORO GUIDE CV LINE RIGHT  03/05/2019  . IR FLUORO GUIDE CV LINE RIGHT  03/15/2019  . IR REMOVAL TUN CV CATH W/O FL  04/12/2019  . IR US GUIDE VASC ACCESS RIGHT  03/05/2019  . IR US GUIDE VASC ACCESS RIGHT  03/15/2019  . TONSILLECTOMY  1980  . UPPER GI ENDOSCOPY  08/2017   [facility]   Social History:  reports that she has never smoked. She has never used smokeless tobacco. She reports current alcohol use of about 3.0 standard drinks of alcohol per week. She reports that she does not use drugs.  Allergies  Allergen Reactions  . Metformin And Related Itching    Heart palpitation  . Sulfa Antibiotics Itching  . Sulfasalazine Itching    Family History  Problem Relation Age of Onset  . Hypertension Mother   . Diabetes Mother   . Heart attack Mother 61  . Asthma Mother        Childhood  . Arthritis Mother   . Heart disease Mother   . Hyperlipidemia Mother   . Hypertension Sister   . Diabetes Sister   . Asthma Sister 40       asthma attack cause of death  . Diabetes Brother   . Hypertension Brother   . Diabetes Brother   . Hypertension Brother   . Hypertension Maternal Grandmother   . Heart attack Maternal Grandfather 60  . Colon cancer Neg Hx  Prior to Admission medications   Medication Sig Start Date End Date Taking? Authorizing Provider  Alum Hydroxide-Mag Carbonate (GAVISCON EXTRA STRENGTH PO) Take 1 tablet by mouth 4 (four) times daily as needed. 03/26/19   [provider]  calcium carbonate (TUMS - DOSED IN MG ELEMENTAL CALCIUM) 500 MG chewable tablet Chew 2 tablets (400 mg of elemental calcium total) by mouth every 3 (three) hours. 03/15/19   Swayze, Ava, DO  cholecalciferol (VITAMIN D3) 25 MCG (1000 UT) tablet Take 1,000 Units by mouth daily.    [provider]  dextromethorphan-guaiFENesin (MUCINEX DM) 30-600 MG 12hr tablet Take 2  tablets by mouth 2 (two) times daily as needed for cough.    [provider]  diphenhydrAMINE (BENADRYL) 50 MG capsule Take 1 capsule (50 mg total) by mouth daily as needed (on call for plasma exchange). 03/15/19   Swayze, Ava, DO  Fexofenadine-Pseudoephedrine (ALLEGRA-D 24 HOUR PO) Take by mouth.    [provider]  fluorometholone (FML) 0.1 % ophthalmic suspension Place 1 drop into the left eye 4 (four) times daily.     [provider]  folic acid (FOLVITE) 1 MG tablet Take 2 tablets (2 mg total) by mouth daily. 04/09/19   Volanda Napoleon, MD  furosemide (LASIX) 40 MG tablet Take 1 tablet (40 mg total) by mouth daily. 03/27/19   Volanda Napoleon, MD  glucose blood test strip One touch ultra 11/11/15   [provider]  lidocaine-prilocaine (EMLA) cream Apply to affected area once 05/15/19   Ennever, Rudell Cobb, MD  Multiple Vitamins-Minerals (MULTI FOR HER 50+) TABS Take 1 tablet by mouth daily.    [provider]  potassium chloride (KLOR-CON) 10 MEQ tablet Take 1 tablet (10 mEq total) by mouth daily. 03/27/19   Volanda Napoleon, MD  senna-docusate (SENOKOT-S) 8.6-50 MG tablet Take 2 tablets by mouth 2 (two) times daily. 03/15/19   Swayze, Ava, DO  sitaGLIPtin (JANUVIA) 100 MG tablet Take 1 tablet (100 mg total) by mouth daily. 04/29/19   Lesleigh Noe, MD  traMADol (ULTRAM) 50 MG tablet Take 1 tablet (50 mg total) by mouth every 6 (six) hours as needed. 03/15/19 03/14/20  Swayze, Radene Gunning, DO   Physical Exam: Vitals:   05/16/19 0937 05/16/19 0939 05/16/19 1413 05/16/19 1530  BP:  134/78 (!) 151/85 (!) 146/75  Pulse:  100 90 84  Resp:  18 16 11   Temp:  (!) 100.4 F (38 C) 98.7 F (37.1 C) 99.1 F (37.3 C)  TempSrc:  Oral Oral Oral  SpO2:  97% 98% 100%  Weight: 67.1 kg     Height: 5' 2"  (1.575 m)       Wt Readings from Last 3 Encounters:  05/16/19 67.1 kg  05/08/19 69.3 kg  05/02/19 67.6 kg    General:  Appears calm and comfortable. AAO x4. Eyes: EOMI,  normal lids, irises & conjunctiva ENT: grossly normal hearing Neck: normal ROM Cardiovascular: RRR, no m/r/g. No LE edema. Respiratory: CTA bilaterally, no w/r/r. Normal respiratory effort. Abdomen: soft, ntnd Skin: Faint erythematous rash with mild edema noted over left forearm medial to AC ~ 10 cm in length. Warm to the touch. No tenderness noted.  Musculoskeletal: grossly normal tone BUE/BLE Psychiatric: grossly normal mood and affect, speech fluent and appropriate Neurologic: grossly non-focal.          Labs on Admission:  Basic Metabolic Panel: Recent Labs  Lab 05/15/19 0827 05/16/19 1013  NA 137 133*  K 4.0 3.5  CL 103 102  CO2 28 25  GLUCOSE 225* 251*  BUN 11 10  CREATININE 0.81 0.70  CALCIUM 10.1 9.1   Liver Function Tests: Recent Labs  Lab 05/15/19 0827 05/16/19 1013  AST 53* 70*  ALT 32 37  ALKPHOS 106 99  BILITOT 0.7 1.8*  PROT 8.0 7.8  ALBUMIN 3.8 3.5   No results for input(s): LIPASE, AMYLASE in the last 168 hours. No results for input(s): AMMONIA in the last 168 hours. CBC: Recent Labs  Lab 05/15/19 0827 05/16/19 1013  WBC 1.9* 1.6*  NEUTROABS 0.8* 1.1*  HGB 10.4* 10.2*  HCT 31.1* 29.5*  MCV 93.1 92.5  PLT 53* 13*   Cardiac Enzymes: No results for input(s): CKTOTAL, CKMB, CKMBINDEX, TROPONINI in the last 168 hours.  BNP (last 3 results) No results for input(s): BNP in the last 8760 hours.  ProBNP (last 3 results) No results for input(s): PROBNP in the last 8760 hours.  CBG: No results for input(s): GLUCAP in the last 168 hours.  Radiological Exams on Admission: DG Chest Port 1 View  Result Date: 05/16/2019 CLINICAL DATA:  Fever, chemo infusion yesterday EXAM: PORTABLE CHEST 1 VIEW COMPARISON:  03/18/2019 FINDINGS: Cardiomediastinal contours and hilar structures are normal. Lungs are clear. No signs of pleural effusion. Visualized skeletal structures are unremarkable. IMPRESSION: No active disease. Electronically Signed   By: Zetta Bills M.D.   On: 05/16/2019 10:04    Assessment/Plan Principal Problem:   TTP (thrombotic thrombocytopenic purpura) (HCC) Active Problems:   Controlled type 2 diabetes mellitus without complication (HCC)   Liver cirrhosis secondary to NASH [facility])   Cellulitis  59 y.o. 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  TTP with Chronic Leukopenia - presented with rash and fever and found to have Plt level of 13 - Per Oncology: Again, I have to believe that her TTP is relapsing.  By the incredibly low ADAMTS-13 level, and the dropping platelet count, this is a very good indicator for TTP relapse. Again she is only had 1 dose of Rituxan.  She really needs to have 4 in order to see a response. We will go ahead and start her on plasma exchange.  I have already notified the blood bank.  I have notified the dialysis unit. I am sure we will be able to get her plasma exchange started on Friday. She will need daily exchanges for probably 7 days. - Addendum: Messaged by PCP after seeing patient who reports patient had some labial abscesses that she would like evaluated while inpatient.   Cellulitis - patient appears to have cellulitis on her left arm in setting of fever and leukopenia  - Will treat with Keflex QID x5 days - Any medication can cause thrombocytopenia; follow closely - Procal pending    HTN - cont PTA Lasix  DM - PTA Glimepiride not on formulary; Linagliptin ordered as alternative with sliding scale   Code Status: Full DVT Prophylaxis: None; thrombocytompenia Family Communication: None Disposition Plan: Admit to inpatient. Patient with severely low platelet count requiring plasma exchange. Heme/Onc consulted by ED and following. Per their note, she will probably need 7 days of plasma exchange. Anticipate discharge back to home in 1 week.   Time spent: [facility] Pager  [phone]

## 2019-05-16 NOTE — ED Notes (Signed)
ED Provider at bedside. 

## 2019-05-16 NOTE — Consult Note (Signed)
Referral MD  Reason for Referral: Relapsed TTP  Chief Complaint  Patient presents with  . Rash  . Fever  : I had this rash on my arm.  HPI: Ms. Knoles is well-known to me.  She is a very nice 59 year old African-American female.  The real problem that she has that she has cirrhosis.  She has NASH.  She has chronic leukopenia and thrombocytopenia.  Back in January, she developed TTP.  Her ADAMTS-13 was less than 5%.  She underwent plasma exchange.  She had a nice response with her platelet count going up to about 75,000.  We were able to wean her off plasma exchange.  Recently, in the office, her ADAMTS-13 was 2%.  This I thought was an indicator that the TTP was trying to relapse.  We started her on Rituxan.  She had 1 dose Rituxan yesterday.  She ended up in the emergency room at [place].  She had a platelet count of 13,000.  She had this rash on her left arm.  She had no obvious bleeding.  She had a temperature of 101.  Typically, her platelet count is around 50,000.  At 13,000, I have a sense that her TTP is active again.  She is going to have to be admitted for plasma exchange again.  She has had no neurological issues.  She has had no cough.  She has had these skin abscesses down in her inguinal area.  I do not know if these are related.  She has diabetes.  Her blood sugar I think was 251 in the emergency room.  Overall, she has a good performance status of ECOG 1.      Past Medical History:  Diagnosis Date  . Allergy   . Arthritis   . Boil   . Cancer ([place])   . 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 Diabetes ([place])    type 2  . Elevated liver enzymes   . Fatty liver   . Hyperlipemia   . Hypertension   . 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 PVC's (premature ventricular contractions)   . TTP (thrombotic thrombocytopenic purpura) ([place]) 03/19/2019  :  Past Surgical History:  Procedure Laterality Date  . COLONOSCOPY  2014  .  ECTOPIC PREGNANCY SURGERY  1990  . IR FLUORO GUIDE CV LINE RIGHT  03/05/2019  . IR FLUORO GUIDE CV LINE RIGHT  03/15/2019  . IR REMOVAL TUN CV CATH W/O FL  04/12/2019  . IR US GUIDE VASC ACCESS RIGHT  03/05/2019  . IR US GUIDE VASC ACCESS RIGHT  03/15/2019  . TONSILLECTOMY  1980  . UPPER GI ENDOSCOPY  08/2017   [facility]  :  No current facility-administered medications for this encounter.  Current Outpatient Medications:  .  Alum Hydroxide-Mag Carbonate (GAVISCON EXTRA STRENGTH PO), Take 1 tablet by mouth 4 (four) times daily as needed., Disp: , Rfl:  .  calcium carbonate (TUMS - DOSED IN MG ELEMENTAL CALCIUM) 500 MG chewable tablet, Chew 2 tablets (400 mg of elemental calcium total) by mouth every 3 (three) hours., Disp: 30 tablet, Rfl: 0 .  cholecalciferol (VITAMIN D3) 25 MCG (1000 UT) tablet, Take 1,000 Units by mouth daily., Disp: , Rfl:  .  dextromethorphan-guaiFENesin (MUCINEX DM) 30-600 MG 12hr tablet, Take 2 tablets by mouth 2 (two) times daily as needed for cough., Disp: , Rfl:  .  diphenhydrAMINE (BENADRYL) 50 MG capsule, Take 1 capsule (50 mg total) by  mouth daily as needed (on call for plasma exchange)., Disp: 30 capsule, Rfl: 0 .  Fexofenadine-Pseudoephedrine (ALLEGRA-D 24 HOUR PO), Take by mouth., Disp: , Rfl:  .  fluorometholone (FML) 0.1 % ophthalmic suspension, Place 1 drop into the left eye 4 (four) times daily. , Disp: , Rfl:  .  folic acid (FOLVITE) 1 MG tablet, Take 2 tablets (2 mg total) by mouth daily., Disp: 60 tablet, Rfl: 12 .  furosemide (LASIX) 40 MG tablet, Take 1 tablet (40 mg total) by mouth daily., Disp: 30 tablet, Rfl: 1 .  glucose blood test strip, One touch ultra, Disp: , Rfl:  .  lidocaine-prilocaine (EMLA) cream, Apply to affected area once, Disp: 30 g, Rfl: 3 .  Multiple Vitamins-Minerals (MULTI FOR HER 50+) TABS, Take 1 tablet by mouth daily., Disp: , Rfl:  .  potassium chloride (KLOR-CON) 10 MEQ tablet, Take 1 tablet (10 mEq total) by mouth  daily., Disp: 30 tablet, Rfl: 1 .  senna-docusate (SENOKOT-S) 8.6-50 MG tablet, Take 2 tablets by mouth 2 (two) times daily., Disp: 20 tablet, Rfl: 0 .  sitaGLIPtin (JANUVIA) 100 MG tablet, Take 1 tablet (100 mg total) by mouth daily., Disp: 90 tablet, Rfl: 0 .  traMADol (ULTRAM) 50 MG tablet, Take 1 tablet (50 mg total) by mouth every 6 (six) hours as needed., Disp: 20 tablet, Rfl: 0:  :  Allergies  Allergen Reactions  . Metformin And Related Itching    Heart palpitation  . Sulfa Antibiotics Itching  . Sulfasalazine Itching  :  Family History  Problem Relation Age of Onset  . Hypertension Mother   . Diabetes Mother   . Heart attack Mother 73  . Asthma Mother        Childhood  . Arthritis Mother   . Heart disease Mother   . Hyperlipidemia Mother   . Hypertension Sister   . Diabetes Sister   . Asthma Sister 40       asthma attack cause of death  . Diabetes Brother   . Hypertension Brother   . Diabetes Brother   . Hypertension Brother   . Hypertension Maternal Grandmother   . Heart attack Maternal Grandfather 60  . Colon cancer Neg Hx   :  Social History   Socioeconomic History  . Marital status: Single    Spouse name: Not on file  . Number of children: 1  . Years of education: Not on file  . Highest education level: Some college, no degree  Occupational History    Employer: [employer]  Tobacco Use  . Smoking status: Never Smoker  . Smokeless tobacco: Never Used  Substance and Sexual Activity  . Alcohol use: Yes    Alcohol/week: 3.0 standard drinks    Types: 3 Standard drinks or equivalent per week    Comment: trying to reduce  . Drug use: No  . Sexual activity: Not Currently    Birth control/protection: Condom  Other Topics Concern  . 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 Difficulty of Paying Living Expenses:   Food Insecurity:   . Worried About Charity fundraiser in the Last Year:   . Arboriculturist in the Last Year:   Transportation Needs:   . Film/video editor (Medical):   [name] Lack of Transportation (Non-Medical):   Physical Activity:   . Days of Exercise per Week:   . Minutes of Exercise per Session:   Stress:   . Feeling of Stress :   Social Connections:   . Frequency of Communication with Friends and Family:   . Frequency of Social Gatherings with Friends and Family:   . Attends Religious Services:   . Active Member of Clubs or Organizations:   . Attends Archivist Meetings:   [name] Marital Status:   Intimate Partner Violence:   . Fear of Current or Ex-Partner:   . Emotionally Abused:   [name] Physically Abused:   . Sexually Abused:   :  Review of Systems  Constitutional: Positive for fever.  HENT: Negative.   Eyes: Negative.   Respiratory: Negative.   Cardiovascular: Negative.   Gastrointestinal: Negative.   Genitourinary: Negative.   Musculoskeletal: Negative.   Skin: Positive for rash.  Neurological: Negative.   Endo/Heme/Allergies: Bruises/bleeds easily.  Psychiatric/Behavioral: Negative.      Exam:  Physical Exam Vitals reviewed.  HENT:     Head: Normocephalic and atraumatic.  Eyes:     Pupils: Pupils are equal, round, and reactive to light.  Cardiovascular:     Rate and Rhythm: Normal rate and regular rhythm.     Heart sounds: Normal heart sounds.  Pulmonary:     Effort: Pulmonary effort is normal.     Breath sounds: Normal breath sounds.  Abdominal:     General: Bowel sounds are normal.     Palpations: Abdomen is soft.  Musculoskeletal:         General: No tenderness or deformity. Normal range of motion.     Cervical back: Normal range of motion.  Lymphadenopathy:     Cervical: No cervical adenopathy.  Skin:    General: Skin is warm and dry.     Findings: No erythema or rash.  Neurological:     Mental Status: She is alert and oriented to person, place, and time.  Psychiatric:        Behavior: Behavior normal.        Thought Content: Thought content normal.        Judgment: Judgment normal.     Patient Vitals for the past 24 hrs:  BP Temp Temp src Pulse Resp SpO2 Height Weight  05/16/19 0939 134/78 (!) 100.4 F (38 C) Oral 100 18 97 % -- --  05/16/19 0937 -- -- -- -- -- -- 5' 2"  (1.575 m) 148 lb (67.1 kg)     Recent Labs    05/15/19 0827 05/16/19 1013  WBC 1.9* 1.6*  HGB 10.4* 10.2*  HCT 31.1* 29.5*  PLT 53* 13*   Recent Labs    05/15/19 0827 05/16/19 1013  NA 137 133*  K 4.0 3.5  CL 103 102  CO2 28 25  GLUCOSE 225* 251*  BUN 11 10  CREATININE 0.81 0.70  CALCIUM 10.1 9.1    Blood smear review: Pending  Pathology: None    Assessment and Plan: Ms. Schweiger is a  very nice 59 year old African-American female.  She has NASH.  She has chronic leukopenia and thrombocytopenia.  Again, I have to believe that her TTP is relapsing.  By the incredibly low ADAMTS-13 level, and the dropping platelet count, this is a very good indicator for TTP relapse.  Again she is only had 1 dose of Rituxan.  She really needs to have 4 in order to see a response.  We will go ahead and start her on plasma exchange.  I have already notified the blood bank.  I have notified the dialysis unit.  I am sure we will be able to get her plasma exchange started on Friday.  She will need daily exchanges for probably 7 days.  She had her Rituxan on Wednesday.  She will not be due for another Rituxan until next Wednesday.  Another option that we can add to the plasma exchange might be caplicizumab.  This is used for patients who  have relapsed/refractory TTP.  We will certainly follow her along.  I very much appreciate everybody's help with her.  I would think that she is going to go to [facility].  Pete Kaleeya Hancock  1 Corinthians 16:13

## 2019-05-16 NOTE — ED Notes (Signed)
report to [facility] ,  [facility] here to get pt

## 2019-05-16 NOTE — ED Notes (Signed)
Call to lab regarding cbc result, lab reported delay eta for result 1200

## 2019-05-16 NOTE — ED Notes (Signed)
Dr Eddie North down to see pt

## 2019-05-16 NOTE — ED Triage Notes (Signed)
Had chemo infusion yesterday. Reports redness to L arm last night and fever.

## 2019-05-16 NOTE — Progress Notes (Signed)
Hx of TTP had first dose of Rituximab, came with new onset rash on the left arm and fever. Lab shows worsening of thrombocytopenia and leukopenia. Hematology Dr. Jonette Eva aware and plan for PLT transfusion and possible plasma apheresis. Need urgent hematology intervention once arrives in [facility].

## 2019-05-16 NOTE — ED Provider Notes (Signed)
[facility] EMERGENCY DEPARTMENT Provider Note   CSN: [id number] Arrival date & time: 05/16/19  0931     History Chief Complaint  Patient presents with  . Rash  . Fever    Vicki [name] is a 59 y.o. female.  She has a history of [place] and TTP.  She had her first dose of chemotherapy yesterday (rituximab).  Last evening she noticed a rash on her left arm and this morning she had a fever to 101.1.  Her infusion was in her right arm.  Denies prior history of this rash.  Did not take anything for her fever.  No chest pain or shortness of breath no abdominal pain vomiting diarrhea or urinary symptoms.  She said she had a boil in her pubic area and gets them frequently.  No obvious bleeding including epistaxis, hematemesis, lower GI bleeding, hematuria.  The history is provided by the patient.  Rash Location:  Shoulder/arm Shoulder/arm rash location:  L elbow Quality: redness   Quality: not bruising, not burning, not draining, not itchy, not painful, not swelling and not weeping   Severity:  Unable to specify Onset quality:  Sudden Duration:  12 hours Timing:  Constant Progression:  Unchanged Chronicity:  New Context: medications   Relieved by:  Nothing Worsened by:  Nothing Ineffective treatments:  None tried Associated symptoms: fever   Associated symptoms: no abdominal pain, no diarrhea, no hoarse voice, no joint pain, no nausea, no shortness of breath, no sore throat, no throat swelling, no tongue swelling and not vomiting   Fever Associated symptoms: rash   Associated symptoms: no chest pain, no chills, no diarrhea, no dysuria, no nausea, no sore throat and no vomiting        Past Medical History:  Diagnosis Date  . Allergy   . Arthritis   . Boil   . Cancer ([place])   . 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 Diabetes ([place])    type 2  . Elevated liver enzymes   . Fatty liver   . Hyperlipemia   . Hypertension   . Palpitations    frequent  PVCs on event monitor  . PVC's (premature ventricular contractions)   . 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 Cardiac murmur 05/08/2019  . Blood glucose elevated 04/23/2019  . Boil of groin 04/23/2019  . 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 Insomnia 02/26/2019  . Acquired pancytopenia ([place]) 11/01/2018  . Liver cirrhosis secondary to NASH ([place]) 04/25/2018  . 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 Spinal stenosis of lumbar region 01/17/2018  . Palpitations 04/15/2015  . Controlled type 2 diabetes mellitus without complication ([place]) 79/89/2119  . Elevated liver enzymes 03/31/2014  . Tinea pedis 07/31/2013  . Foot and toe(s), blister, infected 07/31/2013  . 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 Pain in joint, ankle and foot 08/31/2012    Past Surgical History:  Procedure Laterality Date  . COLONOSCOPY  2014  . ECTOPIC PREGNANCY SURGERY  1990  . IR FLUORO GUIDE CV LINE RIGHT  03/05/2019  . IR FLUORO GUIDE CV LINE RIGHT  03/15/2019  . IR REMOVAL TUN CV CATH W/O FL  04/12/2019  . IR US GUIDE VASC ACCESS RIGHT  03/05/2019  . IR US GUIDE VASC ACCESS RIGHT  03/15/2019  . TONSILLECTOMY  1980  . UPPER GI ENDOSCOPY  08/2017   [facility]  OB History    Gravida  1   Para  0   Term  0   Preterm  0   AB  1   Living  1     SAB  0   TAB  0   Ectopic  1   Multiple  0   Live Births  0           Family History  Problem Relation Age of Onset  . Hypertension Mother   . Diabetes Mother   . Heart attack Mother 38  . Asthma Mother        Childhood  . Arthritis Mother   . Heart disease Mother   . Hyperlipidemia Mother   . Hypertension Sister   . Diabetes Sister   . Asthma Sister 40       asthma attack cause of death  . Diabetes Brother   . Hypertension Brother   . Diabetes Brother   . Hypertension Brother   . Hypertension  Maternal Grandmother   . Heart attack Maternal Grandfather 60  . Colon cancer Neg Hx     Social History   Tobacco Use  . Smoking status: Never Smoker  . Smokeless tobacco: Never Used  Substance Use Topics  . Alcohol use: Yes    Alcohol/week: 3.0 standard drinks    Types: 3 Standard drinks or equivalent per week    Comment: trying to reduce  . Drug use: No    Home Medications Prior to Admission medications   Medication Sig Start Date End Date Taking? Authorizing Provider  Alum Hydroxide-Mag Carbonate (GAVISCON EXTRA STRENGTH PO) Take 1 tablet by mouth 4 (four) times daily as needed. 03/26/19   [provider]  calcium carbonate (TUMS - DOSED IN MG ELEMENTAL CALCIUM) 500 MG chewable tablet Chew 2 tablets (400 mg of elemental calcium total) by mouth every 3 (three) hours. 03/15/19   Swayze, Ava, DO  cholecalciferol (VITAMIN D3) 25 MCG (1000 UT) tablet Take 1,000 Units by mouth daily.    [provider]  dextromethorphan-guaiFENesin (MUCINEX DM) 30-600 MG 12hr tablet Take 2 tablets by mouth 2 (two) times daily as needed for cough.    [provider]  diphenhydrAMINE (BENADRYL) 50 MG capsule Take 1 capsule (50 mg total) by mouth daily as needed (on call for plasma exchange). 03/15/19   Swayze, Ava, DO  Fexofenadine-Pseudoephedrine (ALLEGRA-D 24 HOUR PO) Take by mouth.    [provider]  fluorometholone (FML) 0.1 % ophthalmic suspension Place 1 drop into the left eye 4 (four) times daily.     [provider]  folic acid (FOLVITE) 1 MG tablet Take 2 tablets (2 mg total) by mouth daily. 04/09/19   Volanda Napoleon, MD  furosemide (LASIX) 40 MG tablet Take 1 tablet (40 mg total) by mouth daily. 03/27/19   Volanda Napoleon, MD  glucose blood test strip One touch ultra 11/11/15   [provider]  lidocaine-prilocaine (EMLA) cream Apply to affected area once 05/15/19   Ennever, Rudell Cobb, MD  Multiple Vitamins-Minerals (MULTI FOR HER 50+) TABS Take 1  tablet by mouth daily.    [provider]  potassium chloride (KLOR-CON) 10 MEQ tablet Take 1 tablet (10 mEq total) by mouth daily. 03/27/19   Volanda Napoleon, MD  senna-docusate (SENOKOT-S) 8.6-50 MG tablet Take 2 tablets by mouth 2 (two) times daily. 03/15/19   Swayze, Ava, DO  sitaGLIPtin (JANUVIA) 100 MG tablet Take 1 tablet (100 mg total) by mouth  daily. 04/29/19   Lesleigh Noe, MD  traMADol (ULTRAM) 50 MG tablet Take 1 tablet (50 mg total) by mouth every 6 (six) hours as needed. 03/15/19 03/14/20  Swayze, Ava, DO    Allergies    Metformin and related, Sulfa antibiotics, and Sulfasalazine  Review of Systems   Review of Systems  Constitutional: Positive for fever. Negative for chills.  HENT: Negative for hoarse voice and sore throat.   Eyes: Negative for visual disturbance.  Respiratory: Negative for shortness of breath.   Cardiovascular: Negative for chest pain.  Gastrointestinal: Negative for abdominal pain, blood in stool, diarrhea, nausea and vomiting.  Genitourinary: Negative for dysuria and hematuria.  Musculoskeletal: Negative for arthralgias.  Skin: Positive for rash.  Neurological: Negative for speech difficulty.    Physical Exam Updated Vital Signs BP 134/78 (BP Location: Right Arm)   Pulse 100   Temp (!) 100.4 F (38 C) (Oral)   Resp 18   Ht 5' 2"  (1.575 m)   Wt 67.1 kg   LMP 04/30/2012   SpO2 97%   BMI 27.07 kg/m   Physical Exam Vitals and nursing note reviewed.  Constitutional:      General: She is not in acute distress.    Appearance: She is well-developed.  HENT:     Head: Normocephalic and atraumatic.  Eyes:     Conjunctiva/sclera: Conjunctivae normal.  Cardiovascular:     Rate and Rhythm: Normal rate and regular rhythm.     Heart sounds: No murmur.  Pulmonary:     Effort: Pulmonary effort is normal. No respiratory distress.     Breath sounds: Normal breath sounds.  Abdominal:     Palpations: Abdomen is soft.     Tenderness: There is no  abdominal tenderness.  Musculoskeletal:        General: No deformity or signs of injury. Normal range of motion.     Cervical back: Neck supple.  Skin:    General: Skin is warm and dry.     Capillary Refill: Capillary refill takes less than 2 seconds.     Findings: Rash present.     Comments: There is an area of erythema on her left lower and upper arm approximately 10 cm.  Its not particularly warm or tender.  No obvious skin breaks.  Neurological:     General: No focal deficit present.     Mental Status: She is alert.       ED Results / Procedures / Treatments   Labs (all labs ordered are listed, but only abnormal results are displayed) Labs Reviewed  CBC WITH DIFFERENTIAL/PLATELET - Abnormal; Notable for the following components:      Result Value   WBC 1.6 (*)    RBC 3.19 (*)    Hemoglobin 10.2 (*)    HCT 29.5 (*)    Platelets 13 (*)    Neutro Abs 1.1 (*)    Lymphs Abs 0.2 (*)    All other components within normal limits  COMPREHENSIVE METABOLIC PANEL - Abnormal; Notable for the following components:   Sodium 133 (*)    Glucose, Bld 251 (*)    AST 70 (*)    Total Bilirubin 1.8 (*)    All other components within normal limits  RESPIRATORY PANEL BY RT PCR (FLU A&B, COVID)  URINE CULTURE  CULTURE, BLOOD (ROUTINE X 2)  CULTURE, BLOOD (ROUTINE X 2)  URINALYSIS, ROUTINE W REFLEX MICROSCOPIC  LACTIC ACID, PLASMA  PROCALCITONIN  HEMOGLOBIN A1C  PROCALCITONIN  CBC  WITH DIFFERENTIAL/PLATELET  COMPREHENSIVE METABOLIC PANEL    EKG None  Radiology DG Chest Port 1 View  Result Date: 05/16/2019 CLINICAL DATA:  Fever, chemo infusion yesterday EXAM: PORTABLE CHEST 1 VIEW COMPARISON:  03/18/2019 FINDINGS: Cardiomediastinal contours and hilar structures are normal. Lungs are clear. No signs of pleural effusion. Visualized skeletal structures are unremarkable. IMPRESSION: No active disease. Electronically Signed   By: Zetta Bills M.D.   On: 05/16/2019 10:04     Procedures .Critical Care Performed by: Hayden Rasmussen, MD Authorized by: Hayden Rasmussen, MD   Critical care provider statement:    Critical care time (minutes):  45   Critical care time was exclusive of:  Separately billable procedures and treating other patients   Critical care was necessary to treat or prevent imminent or life-threatening deterioration of the following conditions: blood dyscrasia, fever workup in immune compromised.   Critical care was time spent personally by me on the following activities:  Discussions with consultants, evaluation of patient's response to treatment, examination of patient, ordering and performing treatments and interventions, ordering and review of laboratory studies, ordering and review of radiographic studies, pulse oximetry, re-evaluation of patient's condition, obtaining history from patient or surrogate, review of old charts and development of treatment plan with patient or surrogate   (including critical care time)  Medications Ordered in ED Medications  acetaminophen (TYLENOL) tablet 650 mg (650 mg Oral Given 05/16/19 1053)    ED Course  I have reviewed the triage vital signs and the nursing notes.  Pertinent labs & imaging results that were available during my care of the patient were reviewed by me and considered in my medical decision making (see chart for details).  Clinical Course as of May 15 1429  Thu May 16, 2019  0955 Differential includes infection, medication reaction, cellulitis, bleeding   [MB]  1150 Patient's pancytopenic with lower platelets than baseline having gone down from 50s down to 13.  Discussed with Dr. Marin Olp her oncologist.  He is recommending should patient be admitted to [facility] as she may need plasmapheresis.   [MB]  1215 Discussed with Dr. Roosevelt Locks [facility] hospital[facility] at [facility] who will admit to his service.   [MB]  1430 [facility] here to transfer patient to [facility].   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

## 2019-05-17 ENCOUNTER — Inpatient Hospital Stay (HOSPITAL_COMMUNITY): Payer: Federal, State, Local not specified - PPO

## 2019-05-17 DIAGNOSIS — Z833 Family history of diabetes mellitus: Secondary | ICD-10-CM

## 2019-05-17 DIAGNOSIS — Z8249 Family history of ischemic heart disease and other diseases of the circulatory system: Secondary | ICD-10-CM

## 2019-05-17 HISTORY — PX: IR FLUORO GUIDE CV LINE RIGHT: IMG2283

## 2019-05-17 HISTORY — PX: IR US GUIDE VASC ACCESS RIGHT: IMG2390

## 2019-05-17 LAB — BASIC METABOLIC PANEL
Anion gap: 11 (ref 5–15)
BUN: 9 mg/dL (ref 6–20)
CO2: 22 mmol/L (ref 22–32)
Calcium: 9.7 mg/dL (ref 8.9–10.3)
Chloride: 103 mmol/L (ref 98–111)
Creatinine, Ser: 0.82 mg/dL (ref 0.44–1.00)
GFR calc Af Amer: >60 mL/min (ref >60)
GFR calc non Af Amer: >60 mL/min (ref >60)
Glucose, Bld: 176 mg/dL — ABNORMAL HIGH (ref 70–99)
Potassium: 3.7 mmol/L (ref 3.5–5.1)
Sodium: 136 mmol/L (ref 135–145)

## 2019-05-17 LAB — COMPREHENSIVE METABOLIC PANEL
ALT: 50 U/L — ABNORMAL HIGH (ref 0–44)
AST: 107 U/L — ABNORMAL HIGH (ref 15–41)
Albumin: 3.4 g/dL — ABNORMAL LOW (ref 3.5–5.0)
Alkaline Phosphatase: 78 U/L (ref 38–126)
Anion gap: 11 (ref 5–15)
BUN: 8 mg/dL (ref 6–20)
CO2: 24 mmol/L (ref 22–32)
Calcium: 9.3 mg/dL (ref 8.9–10.3)
Chloride: 101 mmol/L (ref 98–111)
Creatinine, Ser: 0.82 mg/dL (ref 0.44–1.00)
GFR calc Af Amer: >60 mL/min (ref >60)
GFR calc non Af Amer: >60 mL/min (ref >60)
Glucose, Bld: 175 mg/dL — ABNORMAL HIGH (ref 70–99)
Potassium: 3.8 mmol/L (ref 3.5–5.1)
Sodium: 136 mmol/L (ref 135–145)
Total Bilirubin: 1.6 mg/dL — ABNORMAL HIGH (ref 0.3–1.2)
Total Protein: 7.9 g/dL (ref 6.5–8.1)

## 2019-05-17 LAB — ADAMTS13 ANTIBODY: ADAMTS13 Antibody: 52 Units/mL — ABNORMAL HIGH (ref <12)

## 2019-05-17 LAB — CBC WITH DIFFERENTIAL/PLATELET
Abs Immature Granulocytes: 0.01 10*3/uL (ref 0.00–0.07)
Basophils Absolute: 0 10*3/uL (ref 0.0–0.1)
Basophils Relative: 1 %
Eosinophils Absolute: 0.1 10*3/uL (ref 0.0–0.5)
Eosinophils Relative: 3 %
HCT: 29.8 % — ABNORMAL LOW (ref 36.0–46.0)
Hemoglobin: 10 g/dL — ABNORMAL LOW (ref 12.0–15.0)
Immature Granulocytes: 1 %
Lymphocytes Relative: 14 %
Lymphs Abs: 0.3 10*3/uL — ABNORMAL LOW (ref 0.7–4.0)
MCH: 30.6 pg (ref 26.0–34.0)
MCHC: 33.6 g/dL (ref 30.0–36.0)
MCV: 91.1 fL (ref 80.0–100.0)
Monocytes Absolute: 0.3 10*3/uL (ref 0.1–1.0)
Monocytes Relative: 14 %
Neutro Abs: 1.2 10*3/uL — ABNORMAL LOW (ref 1.7–7.7)
Neutrophils Relative %: 67 %
Platelets: 11 10*3/uL — CL (ref 150–400)
RBC: 3.27 MIL/uL — ABNORMAL LOW (ref 3.87–5.11)
RDW: 12.8 % (ref 11.5–15.5)
WBC: 1.8 10*3/uL — ABNORMAL LOW (ref 4.0–10.5)
nRBC: 0 % (ref 0.0–0.2)

## 2019-05-17 LAB — ADAMTS13 ACTIVITY: Adamts 13 Activity: 2.4 % — CL (ref >66.8)

## 2019-05-17 LAB — GLUCOSE, CAPILLARY
Glucose-Capillary: 161 mg/dL — ABNORMAL HIGH (ref 70–99)
Glucose-Capillary: 235 mg/dL — ABNORMAL HIGH (ref 70–99)
Glucose-Capillary: 216 mg/dL — ABNORMAL HIGH (ref 70–99)
Glucose-Capillary: 191 mg/dL — ABNORMAL HIGH (ref 70–99)

## 2019-05-17 LAB — URINE CULTURE

## 2019-05-17 LAB — RETICULOCYTES
Immature Retic Fract: 12.7 % (ref 2.3–15.9)
RBC.: 3.29 MIL/uL — ABNORMAL LOW (ref 3.87–5.11)
Retic Count, Absolute: 51 10*3/uL (ref 19.0–186.0)
Retic Ct Pct: 1.6 % (ref 0.4–3.1)

## 2019-05-17 LAB — PROCALCITONIN: Procalcitonin: 0.68 ng/mL

## 2019-05-17 LAB — LACTATE DEHYDROGENASE: LDH: 369 U/L — ABNORMAL HIGH (ref 98–192)

## 2019-05-17 MED ORDER — HEPARIN SODIUM (PORCINE) 1000 UNIT/ML IJ SOLN
INTRAMUSCULAR | Status: AC
  Filled 2019-05-17: qty 1

## 2019-05-17 MED ORDER — CHLORHEXIDINE GLUCONATE CLOTH 2 % EX PADS
6.0000 | MEDICATED_PAD | Freq: Every day | CUTANEOUS | Status: DC
  Administered 2019-05-18 – 2019-05-24 (×7): 6 via TOPICAL

## 2019-05-17 MED ORDER — CALCIUM CARBONATE ANTACID 500 MG PO CHEW
CHEWABLE_TABLET | ORAL | Status: AC
  Administered 2019-05-17: 400 mg via ORAL
  Filled 2019-05-17: qty 4

## 2019-05-17 MED ORDER — SODIUM CHLORIDE 0.9 % IV SOLN
2.0000 g | INTRAVENOUS | Status: AC
  Administered 2019-05-17: 2 g via INTRAVENOUS
  Filled 2019-05-17: qty 20

## 2019-05-17 MED ORDER — DIPHENHYDRAMINE HCL 25 MG PO CAPS
25.0000 mg | ORAL_CAPSULE | Freq: Four times a day (QID) | ORAL | Status: DC | PRN
  Administered 2019-05-17: 25 mg via ORAL

## 2019-05-17 MED ORDER — ACD FORMULA A 0.73-2.45-2.2 GM/100ML VI SOLN
Status: AC
  Administered 2019-05-17: 1000 mL
  Filled 2019-05-17: qty 500

## 2019-05-17 MED ORDER — ACETAMINOPHEN 325 MG PO TABS: 650.0000 mg | ORAL_TABLET | ORAL | Status: DC | PRN

## 2019-05-17 MED ORDER — DIPHENHYDRAMINE HCL 25 MG PO CAPS
ORAL_CAPSULE | ORAL | Status: AC
  Administered 2019-05-17: 25 mg via ORAL
  Filled 2019-05-17: qty 1

## 2019-05-17 MED ORDER — ACD FORMULA A 0.73-2.45-2.2 GM/100ML VI SOLN
1000.0000 mL | Status: DC
  Filled 2019-05-17: qty 1000

## 2019-05-17 MED ORDER — CALCIUM CARBONATE ANTACID 500 MG PO CHEW
2.0000 | CHEWABLE_TABLET | ORAL | Status: AC
  Administered 2019-05-17 (×2): 400 mg via ORAL
  Filled 2019-05-17 (×2): qty 2

## 2019-05-17 MED ORDER — DIPHENHYDRAMINE HCL 25 MG PO CAPS
ORAL_CAPSULE | ORAL | Status: AC
  Filled 2019-05-17: qty 1

## 2019-05-17 MED ORDER — LIDOCAINE HCL 1 % IJ SOLN
INTRAMUSCULAR | Status: AC
  Filled 2019-05-17: qty 20

## 2019-05-17 MED ORDER — LIDOCAINE HCL 1 % IJ SOLN
INTRAMUSCULAR | Status: AC | PRN
  Administered 2019-05-17: 5 mL

## 2019-05-17 MED ORDER — CHLORHEXIDINE GLUCONATE CLOTH 2 % EX PADS
6.0000 | MEDICATED_PAD | Freq: Every day | CUTANEOUS | Status: DC
  Administered 2019-05-17: 6 via TOPICAL

## 2019-05-17 MED ORDER — ANTICOAGULANT SODIUM CITRATE 4% (200MG/5ML) IV SOLN
5.0000 mL | Status: AC
  Administered 2019-05-17: 5 mL
  Filled 2019-05-17: qty 5

## 2019-05-17 NOTE — Plan of Care (Signed)
  Problem: Education: Goal: Knowledge of General Education information will improve Description Including pain rating scale, medication(s)/side effects and non-pharmacologic comfort measures Outcome: Progressing   Problem: Health Behavior/Discharge Planning: Goal: Ability to manage health-related needs will improve Outcome: Progressing   

## 2019-05-17 NOTE — Progress Notes (Signed)
PROGRESS NOTE    Vicki [name]  FWY:[id number] DOB: 06-Feb-1961 DOA: 05/16/2019 PCP: Lesleigh Noe, MD   Brief Narrative: 59 year old female with history of NASH, TTP first episode in December, history of migraine, fatty liver, hypertension, hyperlipidemia admitted with rash and severe thrombocytopenia.  Patient was a started on her first infusion of rituximab 3/10.  She felt fine but noted rash on left arm and fever next day on 3/11.  Patient was seen by PCP labs showed worsening thrombocytopenia and leukopenia Dr. Marin Olp from oncology was contacted by PCP and patient was admitted to [facility]. In [facility] ED: Patient was febrile to 100.16F. BP's mildly elevated. Vitals otherwise stable. CBC with Plt 13, Hgb 10.2, WBC 1.6. CMP WNL. Lactate WNL. UA without evidence of infection. Blood cx drawn.  CXR without acute cardiopulmonary abnormality.   Subjective: Seen this morning after she got her right IJ catheter placed for plasma exchange. Reports her rash on the left arm has resolved. No new complaints. Severely low at 11,000.  LDH 369.  Seen by her oncologist this morning.  Afebrile this morning.  T-max 101.4 last night.   Assessment & Plan:   TP with relapse: With severe thrombocytopenia platelet 11,000.  Hematology oncology on board plan is for plasma exchange, and to continue rituximab. hb is stable at 10 g, no signs of bleeding.  Monitor closely. Recent Labs  Lab 05/15/19 0827 05/16/19 1013 05/17/19 0320  PLT 53* 13* 11*   Leukopenia/neutropenia in the setting of #1. ANC is at 1200.  Continue neutropenic precaution. Recent Labs  Lab 05/15/19 0827 05/16/19 1013 05/17/19 0320  WBC 1.9* 1.6* 1.8*   Controlled type 2 diabetes mellitus without complication, hemoglobin A1c stable at 6.8.  Blood sugar 175-251.  On glimepiride, linagliptin.  Linagliptin resumed.  Continue on sliding scale.  Liver steatosis and nodular contour cystic of some underlying cirrhotic  changes- had [place] abd in 03/03/19 secondary to NASH, LFTs up, ast/alt elevated 107/50.  Monitor.  Cellulitis left arm in the setting of fever and leukopenia.  On Keflex.  Seems to have improved.  Small area of mucosal hematoma on the left upper jaw- monitor  Hypertension: Blood pressure stable on Lasix.  Body mass index is 27.07 kg/m.   DVT prophylaxis:SCD Code Status:full Family Communication: plan of care discussed with patient at bedside. Disposition Plan: Patient is from:home Anticipated Disposition: to home Barriers to discharge or conditions that needs to be met prior to discharge: Patient admitted with severe thrombocytopenia, relapse of TTP remains in the hospital for ongoing management with plasma exchange x likley for 7 days, serial monitoring, will be in the hospital pending further stabilization and clearance by hematology  Consultants: hematology/oncology Procedures:see note Microbiology:see note  Medications: Scheduled Meds: . cephALEXin  500 mg Oral Y7X  . folic acid  2 mg Oral Daily  . furosemide  40 mg Oral Daily  . insulin aspart  0-15 Units Subcutaneous TID WC  . linagliptin  5 mg Oral Daily  . white petrolatum       Continuous Infusions:  Antimicrobials: Anti-infectives (From admission, onward)   Start     Dose/Rate Route Frequency Ordered Stop   05/16/19 1800  cephALEXin (KEFLEX) capsule 500 mg     500 mg Oral Every 6 hours 05/16/19 1636 05/21/19 1759       Objective: Vitals: Today's Vitals   05/16/19 2000 05/16/19 2019 05/16/19 2322 05/17/19 0457  BP:  139/82 119/72 106/65  Pulse:  94 95  92  Resp:  (!) 22 (!) 23 16  Temp:  (!) 100.4 F (38 C) (!) 101.4 F (38.6 C) 98.8 F (37.1 C)  TempSrc:  Oral Oral Oral  SpO2:  99% 94% (!) 25%  Weight:      Height:      PainSc: 0-No pain      No intake or output data in the 24 hours ending 05/17/19 0736 Filed Weights   05/16/19 0937  Weight: 67.1 kg   Weight change:    Intake/Output from  previous day: No intake/output data recorded. Intake/Output this shift: No intake/output data recorded.  Examination:  General exam: AAOx3 ,NAD, weak appearing. HEENT:Oral mucosa moist, Ear/Nose WNL grossly,dentition normal. Respiratory system: bilaterally clear, no use of accessory muscle, non tender. Cardiovascular system: S1 & S2 +,regular, No JVD. Gastrointestinal system: Abdomen soft, NT,ND, BS+. Nervous System:Alert, awake, moving extremities and grossly nonfocal Extremities: No edema, distal peripheral pulses palpable.  Skin: No rashes,no icterus. MSK: Normal muscle bulk,tone, power  Data Reviewed: I have personally reviewed following labs and imaging studies CBC: Recent Labs  Lab 05/15/19 0827 05/16/19 1013 05/17/19 0320  WBC 1.9* 1.6* 1.8*  NEUTROABS 0.8* 1.1* 1.2*  HGB 10.4* 10.2* 10.0*  HCT 31.1* 29.5* 29.8*  MCV 93.1 92.5 91.1  PLT 53* 13* 11*   Basic Metabolic Panel: Recent Labs  Lab 05/15/19 0827 05/16/19 1013 05/17/19 0320  NA 137 133* 136  K 4.0 3.5 3.8  CL 103 102 101  CO2 _0 GLUCOSE 225* 251* 175*  BUN _1 CREATININE 0.81 0.70 0.82  CALCIUM 10.1 9.1 9.3   GFR: Estimated Creatinine Clearance: 66.4 mL/min (by C-G formula based on SCr of 0.82 mg/dL). Liver Function Tests: Recent Labs  Lab 05/15/19 0827 05/16/19 1013 05/17/19 0320  AST 53* 70* 107*  ALT 32 37 50*  ALKPHOS 106 99 78  BILITOT 0.7 1.8* 1.6*  PROT 8.0 7.8 7.9  ALBUMIN 3.8 3.5 3.4*   No results for input(s): LIPASE, AMYLASE in the last 168 hours. No results for input(s): AMMONIA in the last 168 hours. Coagulation Profile: No results for input(s): INR, PROTIME in the last 168 hours. Cardiac Enzymes: No results for input(s): CKTOTAL, CKMB, CKMBINDEX, TROPONINI in the last 168 hours. BNP (last 3 results) No results for input(s): PROBNP in the last 8760 hours. HbA1C: Recent Labs    05/16/19 1711  HGBA1C 6.8*   CBG: Recent Labs  Lab 05/16/19 1749  05/16/19 2116 05/17/19 0607  GLUCAP 166* 205* 161*   Lipid Profile: No results for input(s): CHOL, HDL, LDLCALC, TRIG, CHOLHDL, LDLDIRECT in the last 72 hours. Thyroid Function Tests: No results for input(s): TSH, T4TOTAL, FREET4, T3FREE, THYROIDAB in the last 72 hours. Anemia Panel: Recent Labs    05/17/19 0320  RETICCTPCT 1.6   Sepsis Labs: Recent Labs  Lab 05/16/19 1013 05/16/19 1545 05/17/19 0320  PROCALCITON  --  0.76 0.68  LATICACIDVEN 1.1  --   --     Recent Results (from the past 240 hour(s))  Culture, blood (routine x 2)     Status: None (Preliminary result)   Collection Time: 05/16/19 10:13 AM   Specimen: BLOOD  Result Value Ref Range Status   Specimen Description   Final    BLOOD RIGHT ANTECUBITAL Performed at [facility], [address] [place], [place] [address]    Special Requests   Final    BOTTLES DRAWN AEROBIC AND ANAEROBIC Blood Culture adequate volume  Performed at [facility], [address] [place], [place] [address]    Culture   Final    NO GROWTH < 24 HOURS Performed at [facility], [place] [address], [place], [place] [address]    Report Status PENDING  Incomplete  Culture, blood (routine x 2)     Status: None (Preliminary result)   Collection Time: 05/16/19 10:23 AM   Specimen: BLOOD  Result Value Ref Range Status   Specimen Description   Final    BLOOD LEFT ANTECUBITAL Performed at [facility], [address] [place], [place] [address]    Special Requests   Final    BOTTLES DRAWN AEROBIC AND ANAEROBIC Blood Culture adequate volume Performed at [facility], [address] [place], [place] [address]    Culture   Final    NO GROWTH < 24 HOURS Performed at [facility], [place] [address] [place], [place] [address]    Report Status PENDING  Incomplete  Respiratory Panel by RT PCR (Flu A&B, Covid) - Nasopharyngeal Swab     Status: None   Collection Time: 05/16/19 12:01 PM    Specimen: Nasopharyngeal Swab  Result Value Ref Range Status   SARS Coronavirus 2 by RT PCR NEGATIVE NEGATIVE Final    Comment: (NOTE) SARS-CoV-2 target nucleic acids are NOT DETECTED. The SARS-CoV-2 RNA is generally detectable in upper respiratoy specimens during the acute phase of infection. The lowest concentration of SARS-CoV-2 viral copies this assay can detect is 131 copies/mL. A negative result does not preclude SARS-Cov-2 infection and should not be used as the sole basis for treatment or other patient management decisions. A negative result may occur with  improper specimen collection/handling, submission of specimen other than nasopharyngeal swab, presence of viral mutation(s) within the areas targeted by this assay, and inadequate number of viral copies (<131 copies/mL). A negative result must be combined with clinical observations, patient history, and epidemiological information. The expected result is Negative. Fact Sheet for Patients:  PinkCheek.be Fact Sheet for Healthcare Providers:  GravelBags.it This test is not yet ap proved or cleared by the [place] FDA and  has been authorized for detection and/or diagnosis of SARS-CoV-2 by FDA under an Emergency Use Authorization (EUA). This EUA will remain  in effect (meaning this test can be used) for the duration of the COVID-19 declaration under Section 564(b)(1) of the Act, 21 U.S.C. section 360bbb-3(b)(1), unless the authorization is terminated or revoked sooner.    Influenza A by PCR NEGATIVE NEGATIVE Final   Influenza B by PCR NEGATIVE NEGATIVE Final    Comment: (NOTE) The Xpert Xpress SARS-CoV-2/FLU/RSV assay is intended as an aid in  the diagnosis of influenza from Nasopharyngeal swab specimens and  should not be used as a sole basis for treatment. Nasal washings and  aspirates are unacceptable for Xpert Xpress SARS-CoV-2/FLU/RSV  testing. Fact Sheet  for Patients: PinkCheek.be Fact Sheet for Healthcare Providers: GravelBags.it This test is not yet approved or cleared by the [place] FDA and  has been authorized for detection and/or diagnosis of SARS-CoV-2 by  FDA under an Emergency Use Authorization (EUA). This EUA will remain  in effect (meaning this test can be used) for the duration of the  Covid-19 declaration under Section 564(b)(1) of the Act, 21  U.S.C. section 360bbb-3(b)(1), unless the authorization is  terminated or revoked. Performed at [facility], [address], [place], [place] [address]  Radiology Studies: DG Chest Port 1 View  Result Date: 05/16/2019 CLINICAL DATA:  Fever, chemo infusion yesterday EXAM: PORTABLE CHEST 1 VIEW COMPARISON:  03/18/2019 FINDINGS: Cardiomediastinal contours and hilar structures are normal. Lungs are clear. No signs of pleural effusion. Visualized skeletal structures are unremarkable. IMPRESSION: No active disease. Electronically Signed   By: Zetta Bills M.D.   On: 05/16/2019 10:04     LOS: 1 day   Time spent: More than 50% of that time was spent in counseling and/or coordination of care.  Antonieta Pert, MD [facility]  05/17/2019, 7:36 AM

## 2019-05-17 NOTE — Procedures (Signed)
Interventional Radiology Procedure Note  Procedure: RT IJ TEMP MAHURKAR CATH  Complications: None  Estimated Blood Loss: MIN  Findings: TIP SVCRA, FOR PLASMAPHERSIS(TTP)

## 2019-05-17 NOTE — Progress Notes (Signed)
Ms. Severtson is doing okay this morning.  She does have a small hematoma on the bucca mucosa on the left side of her mouth.  She has had no obvious bleeding.  Her platelet count is 11,000.  Her LDH is 369.  Hopefully, she should start the plasma exchange today.  Her blood sugar is 175.  Her creatinine is 0.82.  She has had no weakness.  She has had no blurred vision.  There is been no cough.  Her temperature is 98.8.  Pulse 92.  Blood pressure 106/65.  Her head and neck exam shows no scleral icterus.  She has no adenopathy in the neck.  She has a small hematoma on the left buccal mucosa.  Lungs are clear.  Cardiac exam regular rate and rhythm.  Abdomen is soft.  There is no fluid wave.  There is no palpable liver or spleen tip.  Extremities shows no clubbing, cyanosis or edema.  Neurological exam shows no focal neurological deficits.  Skin exam shows no petechia.  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  She will start her plasma exchange today.  She will be due for Rituxan on Wednesday.  I will have to think about using caplicizumab.  This is approved for relapsed/refractory TTP.  Very much appreciate the outstanding care that she is getting from all the staff on 4 E.  I know that she will be in very good hands.  As always, we had a very good prayer at the end of our meeting.  Her faith remains strong.  Lattie Haw, MD  Psalm 91:2

## 2019-05-18 DIAGNOSIS — I959 Hypotension, unspecified: Secondary | ICD-10-CM

## 2019-05-18 LAB — CBC WITH DIFFERENTIAL/PLATELET
Abs Immature Granulocytes: 0 10*3/uL (ref 0.00–0.07)
Basophils Absolute: 0 10*3/uL (ref 0.0–0.1)
Basophils Relative: 1 %
Eosinophils Absolute: 0.1 10*3/uL (ref 0.0–0.5)
Eosinophils Relative: 4 %
HCT: 30.2 % — ABNORMAL LOW (ref 36.0–46.0)
Hemoglobin: 10.2 g/dL — ABNORMAL LOW (ref 12.0–15.0)
Immature Granulocytes: 0 %
Lymphocytes Relative: 20 %
Lymphs Abs: 0.4 10*3/uL — ABNORMAL LOW (ref 0.7–4.0)
MCH: 31.2 pg (ref 26.0–34.0)
MCHC: 33.8 g/dL (ref 30.0–36.0)
MCV: 92.4 fL (ref 80.0–100.0)
Monocytes Absolute: 0.3 10*3/uL (ref 0.1–1.0)
Monocytes Relative: 14 %
Neutro Abs: 1.1 10*3/uL — ABNORMAL LOW (ref 1.7–7.7)
Neutrophils Relative %: 61 %
Platelets: 18 10*3/uL — CL (ref 150–400)
RBC: 3.27 MIL/uL — ABNORMAL LOW (ref 3.87–5.11)
RDW: 12.9 % (ref 11.5–15.5)
WBC: 1.8 10*3/uL — ABNORMAL LOW (ref 4.0–10.5)
nRBC: 0 % (ref 0.0–0.2)

## 2019-05-18 LAB — THERAPEUTIC PLASMA EXCHANGE (BLOOD BANK)
Plasma Exchange: 3000
Plasma volume needed: 3000
Unit division: 0
Unit division: 0
Unit division: 0
Unit division: 0
Unit division: 0
Unit division: 0
Unit division: 0
Unit division: 0
Unit division: 0

## 2019-05-18 LAB — COMPREHENSIVE METABOLIC PANEL
ALT: 28 U/L (ref 0–44)
AST: 54 U/L — ABNORMAL HIGH (ref 15–41)
Albumin: 3.3 g/dL — ABNORMAL LOW (ref 3.5–5.0)
Alkaline Phosphatase: 57 U/L (ref 38–126)
Anion gap: 9 (ref 5–15)
BUN: 17 mg/dL (ref 6–20)
CO2: 27 mmol/L (ref 22–32)
Calcium: 9.6 mg/dL (ref 8.9–10.3)
Chloride: 100 mmol/L (ref 98–111)
Creatinine, Ser: 0.84 mg/dL (ref 0.44–1.00)
GFR calc Af Amer: >60 mL/min (ref >60)
GFR calc non Af Amer: >60 mL/min (ref >60)
Glucose, Bld: 185 mg/dL — ABNORMAL HIGH (ref 70–99)
Potassium: 3.8 mmol/L (ref 3.5–5.1)
Sodium: 136 mmol/L (ref 135–145)
Total Bilirubin: 1.3 mg/dL — ABNORMAL HIGH (ref 0.3–1.2)
Total Protein: 6.3 g/dL — ABNORMAL LOW (ref 6.5–8.1)

## 2019-05-18 LAB — RETICULOCYTES
Immature Retic Fract: 12.7 % (ref 2.3–15.9)
RBC.: 3.32 MIL/uL — ABNORMAL LOW (ref 3.87–5.11)
Retic Count, Absolute: 45.8 10*3/uL (ref 19.0–186.0)
Retic Ct Pct: 1.4 % (ref 0.4–3.1)

## 2019-05-18 LAB — GLUCOSE, CAPILLARY
Glucose-Capillary: 247 mg/dL — ABNORMAL HIGH (ref 70–99)
Glucose-Capillary: 178 mg/dL — ABNORMAL HIGH (ref 70–99)
Glucose-Capillary: 211 mg/dL — ABNORMAL HIGH (ref 70–99)
Glucose-Capillary: 235 mg/dL — ABNORMAL HIGH (ref 70–99)

## 2019-05-18 LAB — LACTATE DEHYDROGENASE: LDH: 227 U/L — ABNORMAL HIGH (ref 98–192)

## 2019-05-18 LAB — PROCALCITONIN: Procalcitonin: 0.52 ng/mL

## 2019-05-18 MED ORDER — ANTICOAGULANT SODIUM CITRATE 4% (200MG/5ML) IV SOLN
5.0000 mL | Status: AC
  Administered 2019-05-18: 5 mL
  Filled 2019-05-18: qty 5

## 2019-05-18 MED ORDER — ALUM & MAG HYDROXIDE-SIMETH 200-200-20 MG/5ML PO SUSP
30.0000 mL | Freq: Four times a day (QID) | ORAL | Status: DC | PRN
  Administered 2019-05-18 – 2019-05-23 (×3): 30 mL via ORAL
  Filled 2019-05-18 (×3): qty 30

## 2019-05-18 MED ORDER — DIPHENHYDRAMINE HCL 25 MG PO CAPS
ORAL_CAPSULE | ORAL | Status: AC
  Administered 2019-05-18: 25 mg via ORAL
  Filled 2019-05-18: qty 1

## 2019-05-18 MED ORDER — CALCIUM CARBONATE ANTACID 500 MG PO CHEW
CHEWABLE_TABLET | ORAL | Status: AC
  Administered 2019-05-18: 400 mg via ORAL
  Filled 2019-05-18: qty 4

## 2019-05-18 MED ORDER — SODIUM CHLORIDE 0.9 % IV SOLN
2.0000 g | INTRAVENOUS | Status: AC
  Administered 2019-05-18: 2 g via INTRAVENOUS
  Filled 2019-05-18: qty 20

## 2019-05-18 MED ORDER — SITAGLIPTIN PHOSPHATE 100 MG PO TABS
100.0000 mg | ORAL_TABLET | Freq: Every day | ORAL | Status: DC
  Administered 2019-05-20 – 2019-05-24 (×5): 100 mg via ORAL
  Filled 2019-05-18 (×5): qty 1
  Filled 2019-05-18: qty 4
  Filled 2019-05-18 (×5): qty 1
  Filled 2019-05-18: qty 4
  Filled 2019-05-18: qty 1

## 2019-05-18 MED ORDER — CALCIUM GLUCONATE-NACL 2-0.675 GM/100ML-% IV SOLN: 2.0000 g | Freq: Once | INTRAVENOUS | Status: DC

## 2019-05-18 MED ORDER — ACD FORMULA A 0.73-2.45-2.2 GM/100ML VI SOLN
Status: AC
  Administered 2019-05-18: 1000 mL
  Filled 2019-05-18: qty 500

## 2019-05-18 NOTE — Progress Notes (Signed)
[name] is doing well this morning.  She had a first plasma exchange yesterday.  I very much appreciate the outstanding effort that was done to get her catheter in and to get her plasma exchange done.  Her platelet count is 18,000 today.  More importantly, is at her LDH has come down to 227.  Her hemoglobin is 10.2.  Her creatinine is 0.84.  Her blood sugar is on the high side at 184.  She has had no headache.  She has had no rashes.  She says that the infections on her inguinal area are getting better.  I think that her T-max yesterday was 100.3.  This morning it is 99.1.  Her blood pressure is a little on the lower side at 91/49.  She is done having any lightheadedness.  There is no chest pain.  There is no shortness of breath.  She is not having any diarrhea.  There is no nausea or vomiting.  On exam, her lungs are clear bilaterally.  Cardiac exam regular rate and rhythm with no murmurs, rubs or bruits.  Abdomen is soft.  She has decent bowel sounds.  There is no fluid wave.  There is no obvious hepatomegaly.  Her spleen tip might be at the left costal margin.  Extremities shows no clubbing, cyanosis or edema.  She has good range of motion of her joints.  Neurological exam is nonfocal.  Skin exam shows no petechia.  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  She is on plasma exchange.  She will have her second exchange today.  She will need daily exchanges for 7 days.  She has the peripheral catheter in her right arm.  This can be removed.  I am not sure that she needs to have the cardiac monitor.  However, I will leave this up to the hospitalist as to whether or not to D/C this.  We will have to monitor her blood pressure.  Again, I very much appreciate the outstanding care that she is getting from everybody on 4 E.   Lattie Haw, MD  Psalm 91:2

## 2019-05-18 NOTE — Progress Notes (Signed)
Pt's right subclavian HD cath is oozing blood, pressure dressing placed over original dressing and liter bag of fluid placed as weight will continue to monitor

## 2019-05-18 NOTE — Progress Notes (Addendum)
Order received to remove peripheral IV in right arm. IV removed as ordered. Patient currently with Temporary HD catheter, placed on 05/17/19 to right neck. will monitor patient. Milanna Kozlov, Bettina Gavia RN

## 2019-05-18 NOTE — Progress Notes (Signed)
PROGRESS NOTE    Vicki [name]  HWE:[id number] DOB: 02-Nov-1960 DOA: 05/16/2019 PCP: Lesleigh Noe, MD   Brief Narrative: 59 year old female with history of NASH, TTP first episode in December, history of migraine, fatty liver, hypertension, hyperlipidemia admitted with rash and severe thrombocytopenia.  Patient was a started on her first infusion of rituximab 3/10.  She felt fine but noted rash on left arm and fever next day on 3/11.  Patient was seen by PCP labs showed worsening thrombocytopenia and leukopenia Dr. Marin Olp from oncology was contacted by PCP and patient was admitted to [facility]. In [place] ED: Patient was febrile to 100.59F. BP's mildly elevated. Vitals otherwise stable. CBC with Plt 13, Hgb 10.2, WBC 1.6. CMP WNL. Lactate WNL. UA without evidence of infection. Blood cx drawn.  CXR without acute cardiopulmonary abnormality.   Subjective: Seen and examined this morning.  Denies any nausea vomiting.  Rash improved.  Reports hematoma swelling in the mouth is receding. Overnight no episodes of fever. Blood pressure soft doing well on room air. Severely low platelets coming up 11,000->1800. LDH 369->227.   Assessment & Plan:  TTP with relapse: With severe thrombocytopenia. Hematology oncology on board and started on  plasma exchange 05/17/19-plan is to continue x7 treatment.  Platelet increasing, hb is stable at 10 g, no signs of bleeding.  No petechial hemorrhage/ecchymosis present.Continue plan as per hematology.  Recent Labs  Lab 05/15/19 0827 05/16/19 1013 05/17/19 0320 05/18/19 0326  PLT 53* 13* 11* 18*   Leukopenia/neutropenia in the setting of #1. ANC is at 1200->1100. Continue neutropenic precaution. Recent Labs  Lab 05/15/19 0827 05/16/19 1013 05/17/19 0320 05/18/19 0326  WBC 1.9* 1.6* 1.8* 1.8*   Controlled type 2 diabetes mellitus without complication, hemoglobin A1c stable at 6.8.  Blood sugar overall stable.  We will continue sliding  scale insulin.  At home on glimepiride, linagliptin.  Linagliptin resumed here.  Liver steatosis and nodular contour cystic of some underlying cirrhotic changes- had [place] abd in 03/03/19 secondary to NASH, LFTs up, ast/alt elevated 107/50.  Monitor.  Cellulitis left arm in the setting of fever and leukopenia.  On Keflex.  Seems to have improved.  Small area of mucosal hematoma on the left upper jaw- monitor  Hypertension: Blood pressure soft, decrease  Lasix 40-. Will hold. Resume once BP better.  There is oral hydration.  Body mass index is 27.07 kg/m.   DVT prophylaxis:SCD Code Status:full Family Communication: plan of care discussed with patient at bedside. Disposition Plan: Patient is from:home Anticipated Disposition: to home Barriers to discharge or conditions that needs to be met prior to discharge: Patient admitted with severe thrombocytopenia, relapse of TTP remains in the hospital for ongoing management with plasma exchange x likley for 7 days, serial monitoring, will be in the hospital pending further stabilization and clearance by hematology  Consultants: hematology/oncology Procedures:see note Microbiology:see note  Medications: Scheduled Meds:  cephALEXin  500 mg Oral Q6H   Chlorhexidine Gluconate Cloth  6 each Topical Daily   folic acid  2 mg Oral Daily   furosemide  40 mg Oral Daily   insulin aspart  0-15 Units Subcutaneous TID WC   linagliptin  5 mg Oral Daily   Continuous Infusions:  citrate dextrose      Antimicrobials: Anti-infectives (From admission, onward)   Start     Dose/Rate Route Frequency Ordered Stop   05/16/19 1800  cephALEXin (KEFLEX) capsule 500 mg     500 mg Oral Every 6  hours 05/16/19 1636 05/21/19 1759       Objective: Vitals: Today's Vitals   05/17/19 2000 05/17/19 2331 05/18/19 0315 05/18/19 0431  BP:  (!) 91/47 (!) 91/49   Pulse:  89 83   Resp:  17 20   Temp: 100.3 F (37.9 C) 100.3 F (37.9 C) 99.1 F (37.3 C)    TempSrc: Oral Oral Oral   SpO2:  97% 98%   Weight:      Height:      PainSc:  1  3  3      Intake/Output Summary (Last 24 hours) at 05/18/2019 0736 Last data filed at 05/17/2019 1300 Gross per 24 hour  Intake 480 ml  Output --  Net 480 ml   Filed Weights   05/16/19 0937  Weight: 67.1 kg   Weight change:    Intake/Output from previous day: 03/12 0701 - 03/13 0700 In: 480 [P.O.:480] Out: -  Intake/Output this shift: No intake/output data recorded.  Examination:  General exam: AAOx3 ,NAD, weak appearing. HEENT:Oral mucosa moist, Ear/Nose WNL grossly,dentition normal. Respiratory system: bilaterally clear, no use of accessory muscle, non tender. Cardiovascular system: S1 & S2 +,regular, No JVD. Gastrointestinal system: Abdomen soft, NT,ND, BS+. Nervous System:Alert, awake, moving extremities and grossly nonfocal Extremities: No edema, distal peripheral pulses palpable.  Skin: No rashes,no icterus. MSK: Normal muscle bulk,tone, power  Data Reviewed: I have personally reviewed following labs and imaging studies CBC: Recent Labs  Lab 05/15/19 0827 05/16/19 1013 05/17/19 0320 05/18/19 0326  WBC 1.9* 1.6* 1.8* 1.8*  NEUTROABS 0.8* 1.1* 1.2* 1.1*  HGB 10.4* 10.2* 10.0* 10.2*  HCT 31.1* 29.5* 29.8* 30.2*  MCV 93.1 92.5 91.1 92.4  PLT 53* 13* 11* 18*   Basic Metabolic Panel: Recent Labs  Lab 05/15/19 0827 05/16/19 1013 05/17/19 0320 05/17/19 1025 05/18/19 0326  NA 137 133* 136 136 136  K 4.0 3.5 3.8 3.7 3.8  CL 103 102 101 103 100  CO2 28 25 24 22 27   GLUCOSE 225* 251* 175* 176* 185*  BUN 11 10 8 9 17   CREATININE 0.81 0.70 0.82 0.82 0.84  CALCIUM 10.1 9.1 9.3 9.7 9.6   GFR: Estimated Creatinine Clearance: 64.8 mL/min (by C-G formula based on SCr of 0.84 mg/dL). Liver Function Tests: Recent Labs  Lab 05/15/19 0827 05/16/19 1013 05/17/19 0320 05/18/19 0326  AST 53* 70* 107* 54*  ALT 32 37 50* 28  ALKPHOS 106 99 78 57  BILITOT 0.7 1.8* 1.6* 1.3*   PROT 8.0 7.8 7.9 6.3*  ALBUMIN 3.8 3.5 3.4* 3.3*   No results for input(s): LIPASE, AMYLASE in the last 168 hours. No results for input(s): AMMONIA in the last 168 hours. Coagulation Profile: No results for input(s): INR, PROTIME in the last 168 hours. Cardiac Enzymes: No results for input(s): CKTOTAL, CKMB, CKMBINDEX, TROPONINI in the last 168 hours. BNP (last 3 results) No results for input(s): PROBNP in the last 8760 hours. HbA1C: Recent Labs    05/16/19 1711  HGBA1C 6.8*   CBG: Recent Labs  Lab 05/17/19 0607 05/17/19 1108 05/17/19 1822 05/17/19 2107 05/18/19 0605  GLUCAP 161* 235* 216* 191* 247*   Lipid Profile: No results for input(s): CHOL, HDL, LDLCALC, TRIG, CHOLHDL, LDLDIRECT in the last 72 hours. Thyroid Function Tests: No results for input(s): TSH, T4TOTAL, FREET4, T3FREE, THYROIDAB in the last 72 hours. Anemia Panel: Recent Labs    05/17/19 0320 05/18/19 0326  RETICCTPCT 1.6 1.4   Sepsis Labs: Recent Labs  Lab 05/16/19 1013 05/16/19  1545 05/17/19 0320 05/18/19 0326  PROCALCITON  --  0.76 0.68 0.52  LATICACIDVEN 1.1  --   --   --     Recent Results (from the past 240 hour(s))  Culture, blood (routine x 2)     Status: None (Preliminary result)   Collection Time: 05/16/19 10:13 AM   Specimen: BLOOD  Result Value Ref Range Status   Specimen Description   Final    BLOOD RIGHT ANTECUBITAL Performed at [facility], [address] [place], [place] [address]    Special Requests   Final    BOTTLES DRAWN AEROBIC AND ANAEROBIC Blood Culture adequate volume Performed at [facility], [address] [place], [place] [address]    Culture   Final    NO GROWTH < 24 HOURS Performed at [facility], [place] [address], [place], [place] [address]    Report Status PENDING  Incomplete  Culture, blood (routine x 2)     Status: None (Preliminary result)   Collection Time: 05/16/19 10:23 AM   Specimen: BLOOD  Result Value Ref Range  Status   Specimen Description   Final    BLOOD LEFT ANTECUBITAL Performed at [facility], [address] [place], [place] [address]    Special Requests   Final    BOTTLES DRAWN AEROBIC AND ANAEROBIC Blood Culture adequate volume Performed at [facility], [address] [place], [place] [address]    Culture   Final    NO GROWTH < 24 HOURS Performed at [facility], [place] [address], [place], [place] [address]    Report Status PENDING  Incomplete  Urine culture     Status: Abnormal   Collection Time: 05/16/19 10:24 AM   Specimen: Urine, Random  Result Value Ref Range Status   Specimen Description   Final    URINE, RANDOM Performed at [facility], [address] [place], [place] [address]    Special Requests   Final    NONE Performed at [facility], [address] [place], [place] [address]    Culture MULTIPLE SPECIES PRESENT, SUGGEST RECOLLECTION (A)  Final   Report Status 05/17/2019 FINAL  Final  Respiratory Panel by RT PCR (Flu A&B, Covid) - Nasopharyngeal Swab     Status: None   Collection Time: 05/16/19 12:01 PM   Specimen: Nasopharyngeal Swab  Result Value Ref Range Status   SARS Coronavirus 2 by RT PCR NEGATIVE NEGATIVE Final    Comment: (NOTE) SARS-CoV-2 target nucleic acids are NOT DETECTED. The SARS-CoV-2 RNA is generally detectable in upper respiratoy specimens during the acute phase of infection. The lowest concentration of SARS-CoV-2 viral copies this assay can detect is 131 copies/mL. A negative result does not preclude SARS-Cov-2 infection and should not be used as the sole basis for treatment or other patient management decisions. A negative result may occur with  improper specimen collection/handling, submission of specimen other than nasopharyngeal swab, presence of viral mutation(s) within the areas targeted by this assay, and inadequate number of viral copies (<131 copies/mL). A negative result must  be combined with clinical observations, patient history, and epidemiological information. The expected result is Negative. Fact Sheet for Patients:  PinkCheek.be Fact Sheet for Healthcare Providers:  GravelBags.it This test is not yet ap proved or cleared by the [place] FDA and  has been authorized for detection and/or diagnosis of SARS-CoV-2 by FDA  under an Emergency Use Authorization (EUA). This EUA will remain  in effect (meaning this test can be used) for the duration of the COVID-19 declaration under Section 564(b)(1) of the Act, 21 U.S.C. section 360bbb-3(b)(1), unless the authorization is terminated or revoked sooner.    Influenza A by PCR NEGATIVE NEGATIVE Final   Influenza B by PCR NEGATIVE NEGATIVE Final    Comment: (NOTE) The Xpert Xpress SARS-CoV-2/FLU/RSV assay is intended as an aid in  the diagnosis of influenza from Nasopharyngeal swab specimens and  should not be used as a sole basis for treatment. Nasal washings and  aspirates are unacceptable for Xpert Xpress SARS-CoV-2/FLU/RSV  testing. Fact Sheet for Patients: PinkCheek.be Fact Sheet for Healthcare Providers: GravelBags.it This test is not yet approved or cleared by the [place] FDA and  has been authorized for detection and/or diagnosis of SARS-CoV-2 by  FDA under an Emergency Use Authorization (EUA). This EUA will remain  in effect (meaning this test can be used) for the duration of the  Covid-19 declaration under Section 564(b)(1) of the Act, 21  U.S.C. section 360bbb-3(b)(1), unless the authorization is  terminated or revoked. Performed at [facility], [address] [place], [place] [address]       Radiology Studies: IR Fluoro Guide CV Line Right  Result Date: 05/17/2019 INDICATION: Recurrent TTP, access for plasmapheresis EXAM: ULTRASOUND GUIDANCE FOR VASCULAR  ACCESS RIGHT IJ TEMPORARY MAHURKAR CATHETER FOR PLASMAPHERESIS MEDICATIONS: 1% LIDOCAINE LOCAL ANESTHESIA/SEDATION: Moderate Sedation Time: None. The patient's level of consciousness and vital signs were monitored continuously by radiology nursing throughout the procedure under my direct supervision. FLUOROSCOPY TIME:  Fluoroscopy Time: 0 minutes 42 seconds (1.0 mGy). COMPLICATIONS: None immediate. PROCEDURE: Informed written consent was obtained from the patient after a thorough discussion of the procedural risks, benefits and alternatives. All questions were addressed. Maximal Sterile Barrier Technique was utilized including caps, mask, sterile gowns, sterile gloves, sterile drape, hand hygiene and skin antiseptic. A timeout was performed prior to the initiation of the procedure. Under sterile conditions and local anesthesia, ultrasound micropuncture access performed of the patent right internal jugular vein. Images obtained for documentation of the patent right internal jugular vein. 018 guidewire advanced followed by the micro dilator. Amplatz guidewire exchange performed. Tract dilatation performed to insert a 16 cm Mahurkar temporary catheter for plasmapheresis. Tip position at the SVC RA junction. Images obtained for documentation. Catheter secured with Prolene sutures and a sterile dressing. Blood aspirated easily from all lumens followed by saline and heparin flushes. External caps applied. No immediate complication. Patient tolerated the procedure well. IMPRESSION: Successful ultrasound and fluoroscopic right IJ temporary mahurkar catheter for plasmapheresis. Ready for use. Electronically Signed   By: Jerilynn Mages.  Shick M.D.   On: 05/17/2019 09:17   IR US Guide Vasc Access Right  Result Date: 05/17/2019 INDICATION: Recurrent TTP, access for plasmapheresis EXAM: ULTRASOUND GUIDANCE FOR VASCULAR ACCESS RIGHT IJ TEMPORARY MAHURKAR CATHETER FOR PLASMAPHERESIS MEDICATIONS: 1% LIDOCAINE LOCAL ANESTHESIA/SEDATION:  Moderate Sedation Time: None. The patient's level of consciousness and vital signs were monitored continuously by radiology nursing throughout the procedure under my direct supervision. FLUOROSCOPY TIME:  Fluoroscopy Time: 0 minutes 42 seconds (1.0 mGy). COMPLICATIONS: None immediate. PROCEDURE: Informed written consent was obtained from the patient after a thorough discussion of the procedural risks, benefits and alternatives. All questions were addressed. Maximal Sterile Barrier Technique was utilized including caps, mask, sterile gowns, sterile gloves, sterile drape, hand hygiene and skin antiseptic. A timeout was performed prior to the  initiation of the procedure. Under sterile conditions and local anesthesia, ultrasound micropuncture access performed of the patent right internal jugular vein. Images obtained for documentation of the patent right internal jugular vein. 018 guidewire advanced followed by the micro dilator. Amplatz guidewire exchange performed. Tract dilatation performed to insert a 16 cm Mahurkar temporary catheter for plasmapheresis. Tip position at the SVC RA junction. Images obtained for documentation. Catheter secured with Prolene sutures and a sterile dressing. Blood aspirated easily from all lumens followed by saline and heparin flushes. External caps applied. No immediate complication. Patient tolerated the procedure well. IMPRESSION: Successful ultrasound and fluoroscopic right IJ temporary mahurkar catheter for plasmapheresis. Ready for use. Electronically Signed   By: Jerilynn Mages.  Shick M.D.   On: 05/17/2019 09:17   DG Chest Port 1 View  Result Date: 05/16/2019 CLINICAL DATA:  Fever, chemo infusion yesterday EXAM: PORTABLE CHEST 1 VIEW COMPARISON:  03/18/2019 FINDINGS: Cardiomediastinal contours and hilar structures are normal. Lungs are clear. No signs of pleural effusion. Visualized skeletal structures are unremarkable. IMPRESSION: No active disease. Electronically Signed   By: Zetta Bills M.D.   On: 05/16/2019 10:04     LOS: 2 days   Time spent: More than 50% of that time was spent in counseling and/or coordination of care.  Antonieta Pert, MD [facility]  05/18/2019, 7:36 AM

## 2019-05-19 LAB — CBC WITH DIFFERENTIAL/PLATELET
Abs Immature Granulocytes: 0.01 10*3/uL (ref 0.00–0.07)
Basophils Absolute: 0 10*3/uL (ref 0.0–0.1)
Basophils Relative: 0 %
Eosinophils Absolute: 0.1 10*3/uL (ref 0.0–0.5)
Eosinophils Relative: 4 %
HCT: 29.2 % — ABNORMAL LOW (ref 36.0–46.0)
Hemoglobin: 10 g/dL — ABNORMAL LOW (ref 12.0–15.0)
Immature Granulocytes: 1 %
Lymphocytes Relative: 17 %
Lymphs Abs: 0.3 10*3/uL — ABNORMAL LOW (ref 0.7–4.0)
MCH: 31.3 pg (ref 26.0–34.0)
MCHC: 34.2 g/dL (ref 30.0–36.0)
MCV: 91.3 fL (ref 80.0–100.0)
Monocytes Absolute: 0.2 10*3/uL (ref 0.1–1.0)
Monocytes Relative: 11 %
Neutro Abs: 1.4 10*3/uL — ABNORMAL LOW (ref 1.7–7.7)
Neutrophils Relative %: 67 %
Platelets: 38 10*3/uL — ABNORMAL LOW (ref 150–400)
RBC: 3.2 MIL/uL — ABNORMAL LOW (ref 3.87–5.11)
RDW: 12.5 % (ref 11.5–15.5)
WBC: 2 10*3/uL — ABNORMAL LOW (ref 4.0–10.5)
nRBC: 0 % (ref 0.0–0.2)

## 2019-05-19 LAB — THERAPEUTIC PLASMA EXCHANGE (BLOOD BANK)
Plasma Exchange: 3000
Plasma volume needed: 3000
Unit division: 0
Unit division: 0
Unit division: 0
Unit division: 0
Unit division: 0
Unit division: 0
Unit division: 0
Unit division: 0
Unit division: 0
Unit division: 0
Unit division: 0
Unit division: 0

## 2019-05-19 LAB — COMPREHENSIVE METABOLIC PANEL
ALT: 27 U/L (ref 0–44)
AST: 51 U/L — ABNORMAL HIGH (ref 15–41)
Albumin: 3.2 g/dL — ABNORMAL LOW (ref 3.5–5.0)
Alkaline Phosphatase: 52 U/L (ref 38–126)
Anion gap: 8 (ref 5–15)
BUN: 8 mg/dL (ref 6–20)
CO2: 28 mmol/L (ref 22–32)
Calcium: 9.2 mg/dL (ref 8.9–10.3)
Chloride: 101 mmol/L (ref 98–111)
Creatinine, Ser: 0.8 mg/dL (ref 0.44–1.00)
GFR calc Af Amer: >60 mL/min (ref >60)
GFR calc non Af Amer: >60 mL/min (ref >60)
Glucose, Bld: 156 mg/dL — ABNORMAL HIGH (ref 70–99)
Potassium: 3.3 mmol/L — ABNORMAL LOW (ref 3.5–5.1)
Sodium: 137 mmol/L (ref 135–145)
Total Bilirubin: 0.9 mg/dL (ref 0.3–1.2)
Total Protein: 5.9 g/dL — ABNORMAL LOW (ref 6.5–8.1)

## 2019-05-19 LAB — GLUCOSE, CAPILLARY
Glucose-Capillary: 154 mg/dL — ABNORMAL HIGH (ref 70–99)
Glucose-Capillary: 195 mg/dL — ABNORMAL HIGH (ref 70–99)
Glucose-Capillary: 156 mg/dL — ABNORMAL HIGH (ref 70–99)
Glucose-Capillary: 203 mg/dL — ABNORMAL HIGH (ref 70–99)
Glucose-Capillary: 167 mg/dL — ABNORMAL HIGH (ref 70–99)

## 2019-05-19 LAB — RETICULOCYTES
Immature Retic Fract: 21.7 % — ABNORMAL HIGH (ref 2.3–15.9)
RBC.: 3.19 MIL/uL — ABNORMAL LOW (ref 3.87–5.11)
Retic Count, Absolute: 60.3 10*3/uL (ref 19.0–186.0)
Retic Ct Pct: 1.9 % (ref 0.4–3.1)

## 2019-05-19 LAB — LACTATE DEHYDROGENASE: LDH: 206 U/L — ABNORMAL HIGH (ref 98–192)

## 2019-05-19 MED ORDER — POTASSIUM CHLORIDE CRYS ER 20 MEQ PO TBCR
20.0000 meq | EXTENDED_RELEASE_TABLET | Freq: Once | ORAL | Status: AC
  Administered 2019-05-19: 20 meq via ORAL
  Filled 2019-05-19: qty 1

## 2019-05-19 MED ORDER — CALCIUM CARBONATE ANTACID 500 MG PO CHEW: 2.0000 | CHEWABLE_TABLET | ORAL | Status: AC

## 2019-05-19 MED ORDER — ANTICOAGULANT SODIUM CITRATE 4% (200MG/5ML) IV SOLN
5.0000 mL | Freq: Once | Status: AC
  Administered 2019-05-19: 5 mL
  Filled 2019-05-19 (×2): qty 5

## 2019-05-19 MED ORDER — ACD FORMULA A 0.73-2.45-2.2 GM/100ML VI SOLN
Status: AC
  Filled 2019-05-19: qty 500

## 2019-05-19 MED ORDER — DIPHENHYDRAMINE HCL 25 MG PO CAPS
ORAL_CAPSULE | ORAL | Status: AC
  Administered 2019-05-19: 25 mg
  Filled 2019-05-19: qty 1

## 2019-05-19 MED ORDER — DIPHENHYDRAMINE HCL 25 MG PO CAPS
ORAL_CAPSULE | ORAL | Status: AC
  Administered 2019-05-19: 25 mg via ORAL
  Filled 2019-05-19: qty 1

## 2019-05-19 MED ORDER — ACETAMINOPHEN 325 MG PO TABS
ORAL_TABLET | ORAL | Status: AC
  Filled 2019-05-19: qty 2

## 2019-05-19 MED ORDER — ACETAMINOPHEN 325 MG PO TABS
650.0000 mg | ORAL_TABLET | ORAL | Status: DC | PRN
  Administered 2019-05-19: 650 mg via ORAL

## 2019-05-19 MED ORDER — SODIUM CHLORIDE 0.9 % IV SOLN
2.0000 g | Freq: Once | INTRAVENOUS | Status: AC
  Administered 2019-05-19 – 2019-05-20 (×2): 2 g via INTRAVENOUS
  Filled 2019-05-19 (×3): qty 20

## 2019-05-19 MED ORDER — DIPHENHYDRAMINE HCL 25 MG PO CAPS: 25.0000 mg | ORAL_CAPSULE | Freq: Four times a day (QID) | ORAL | Status: DC | PRN

## 2019-05-19 MED ORDER — ACD FORMULA A 0.73-2.45-2.2 GM/100ML VI SOLN
1000.0000 mL | Status: DC
  Filled 2019-05-19: qty 1000

## 2019-05-19 MED ORDER — CALCIUM CARBONATE ANTACID 500 MG PO CHEW
CHEWABLE_TABLET | ORAL | Status: AC
  Administered 2019-05-19: 400 mg via ORAL
  Filled 2019-05-19: qty 2

## 2019-05-19 NOTE — Progress Notes (Signed)
Patient family brought Januvia to be dispensed, Paper work filled out and medication take to pharmacy, copy placed in patient chart. Rogen Porte, Bettina Gavia RN

## 2019-05-19 NOTE — Progress Notes (Signed)
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  Patient was a started on her first infusion of rituximab 3/10.  She felt fine but noted rash on left arm and fever next day on 3/11.  Patient was seen by PCP labs showed worsening thrombocytopenia and leukopenia Dr. Marin Olp from oncology was contacted by PCP and patient was admitted to [facility]. In [place] ED: Patient was febrile to 100.53F. BP's mildly elevated. Vitals otherwise stable. CBC with Plt 13, Hgb 10.2, WBC 1.6. CMP WNL. Lactate WNL. UA without evidence of infection. Blood cx drawn.  CXR without acute cardiopulmonary abnormality.   Subjective: Overnight afebrile, blood pressure stable 90s to 120s. plt increasing, overnight right subclavian HD cath was oozing blood and pressure was applied.  Globin remains a stable 10 g. LDH 369->227->206. Has small bruise on left thigh.   Assessment & Plan:  TTP with relapse: With severe thrombocytopenia. Hematology oncology on board and started on  plasma exchange 05/17/19.Continue same  for 7 days.Platelet increasing, hb is stable at 10 g, no signs of bleeding. No petechial hemorrhage/ecchymosis present.Continue plan as per hematology.  Recent Labs  Lab 05/15/19 0827 05/16/19 1013 05/17/19 0320 05/18/19 0326 05/19/19 0444  PLT 53* 13* 11* 18* 38*   Leukopenia/neutropenia in the setting of #1. ANC is at 1200->1100->1400. Continue neutropenic precaution. Recent Labs  Lab 05/15/19 0827 05/16/19 1013 05/17/19 0320 05/18/19 0326 05/19/19 0444  WBC 1.9* 1.6* 1.8* 1.8* 2.0*   Hypokalemia:Replete orally today.  Controlled type 2 diabetes mellitus without complication, hemoglobin A1c stable at 6.8.  Blood sugar overall  stable.We will continue sliding scale insulin.  At home on glimepiride, linagliptin.  She wants to take home linagliptin.  Liver steatosis and nodular contour cystic of some underlying cirrhotic changes- had [place] abd in 03/03/19 secondary to NASH, LFTs up, ast/alt elevated 107/50.  Monitor.  Cellulitis left arm in the setting of fever and leukopenia.  On Keflex.  Seems to have improved.  Small area of mucosal hematoma on the left upper jaw- monitor  Hypertension:BP soft, decrease  Lasix 40-. Will hold. Resume once BP better.  There is oral hydration.  Body mass index is 27.07 kg/m.   DVT prophylaxis:SCD Code Status:full Family Communication: plan of care discussed with patient at bedside. Disposition Plan: Patient is from:home Anticipated Disposition: to home Barriers to discharge or conditions that needs to be met prior to discharge: Patient admitted with severe thrombocytopenia, relapse of TTP remains in the hospital for ongoing management with plasma exchange x likley for 7 days, serial monitoring, will be in the hospital pending further stabilization and clearance by hematology  Consultants: hematology/oncology Procedures:see note Microbiology:see note  Medications: Scheduled Meds: . cephALEXin  500 mg Oral Q6H  . Chlorhexidine Gluconate Cloth  6 each Topical Daily  . folic acid  2 mg Oral Daily  . insulin aspart  0-15 Units Subcutaneous TID WC  . sitaGLIPtin  100 mg Oral Daily   Continuous Infusions: . citrate dextrose      Antimicrobials: Anti-infectives (From admission, onward)   Start     Dose/Rate Route Frequency Ordered Stop   05/16/19 1800  cephALEXin (KEFLEX) capsule 500 mg     500 mg Oral Every 6 hours 05/16/19 1636 05/21/19 1759  Objective: Vitals: Today's Vitals   05/18/19 1844 05/18/19 1852 05/18/19 2126 05/19/19 0415  BP: 105/62 102/61 96/67 124/62  Pulse:   100 100  Resp: (!) 22 (!) 27 20 18   Temp: 97.9 F (36.6 C) 98.5 F (36.9 C) 98.5 F  (36.9 C) 99.1 F (37.3 C)  TempSrc: Oral Oral Oral Oral  SpO2:  96% 100% 97%  Weight:      Height:      PainSc:  0-No pain  0-No pain    Intake/Output Summary (Last 24 hours) at 05/19/2019 0738 Last data filed at 05/18/2019 2235 Gross per 24 hour  Intake 600 ml  Output --  Net 600 ml   Filed Weights   05/16/19 0937  Weight: 67.1 kg   Weight change:    Intake/Output from previous day: 03/13 0701 - 03/14 0700 In: 600 [P.O.:600] Out: -  Intake/Output this shift: No intake/output data recorded.  Examination:  General exam: AAOX3,NAD,weak appearing. HEENT:Oral mucosa moist, Ear/Nose WNL grossly,dentition normal. Respiratory system: b/l clear,no use of accessory muscle, non tender. Cardiovascular system: S1 & S2 +,regular,No JVD. Gastrointestinal system: Abdomen soft,NT,ND,BS+. Nervous System:Alert, awake, moving extremities and grossly nonfocal Extremities: No edema, distal peripheral pulses palpable.  Skin: No rashes,no icterus. MSK: Normal muscle bulk,tone, power. Small patches on left thigh  Data Reviewed: I have personally reviewed following labs and imaging studies CBC: Recent Labs  Lab 05/15/19 0827 05/16/19 1013 05/17/19 0320 05/18/19 0326 05/19/19 0444  WBC 1.9* 1.6* 1.8* 1.8* 2.0*  NEUTROABS 0.8* 1.1* 1.2* 1.1* 1.4*  HGB 10.4* 10.2* 10.0* 10.2* 10.0*  HCT 31.1* 29.5* 29.8* 30.2* 29.2*  MCV 93.1 92.5 91.1 92.4 91.3  PLT 53* 13* 11* 18* 38*   Basic Metabolic Panel: Recent Labs  Lab 05/16/19 1013 05/17/19 0320 05/17/19 1025 05/18/19 0326 05/19/19 0444  NA 133* 136 136 136 137  K 3.5 3.8 3.7 3.8 3.3*  CL 102 101 103 100 101  CO2 25 24 22 27 28   GLUCOSE 251* 175* 176* 185* 156*  BUN 10 8 9 17 8   CREATININE 0.70 0.82 0.82 0.84 0.80  CALCIUM 9.1 9.3 9.7 9.6 9.2   GFR: Estimated Creatinine Clearance: 68 mL/min (by C-G formula based on SCr of 0.8 mg/dL). Liver Function Tests: Recent Labs  Lab 05/15/19 0827 05/16/19 1013 05/17/19 0320  05/18/19 0326 05/19/19 0444  AST 53* 70* 107* 54* 51*  ALT 32 37 50* 28 27  ALKPHOS 106 99 78 57 52  BILITOT 0.7 1.8* 1.6* 1.3* 0.9  PROT 8.0 7.8 7.9 6.3* 5.9*  ALBUMIN 3.8 3.5 3.4* 3.3* 3.2*   No results for input(s): LIPASE, AMYLASE in the last 168 hours. No results for input(s): AMMONIA in the last 168 hours. Coagulation Profile: No results for input(s): INR, PROTIME in the last 168 hours. Cardiac Enzymes: No results for input(s): CKTOTAL, CKMB, CKMBINDEX, TROPONINI in the last 168 hours. BNP (last 3 results) No results for input(s): PROBNP in the last 8760 hours. HbA1C: Recent Labs    05/16/19 1711  HGBA1C 6.8*   CBG: Recent Labs  Lab 05/18/19 0605 05/18/19 1139 05/18/19 1930 05/18/19 2123 05/19/19 0614  GLUCAP 247* 178* 211* 235* 154*   Lipid Profile: No results for input(s): CHOL, HDL, LDLCALC, TRIG, CHOLHDL, LDLDIRECT in the last 72 hours. Thyroid Function Tests: No results for input(s): TSH, T4TOTAL, FREET4, T3FREE, THYROIDAB in the last 72 hours. Anemia Panel: Recent Labs    05/18/19 0326 05/19/19 0443  RETICCTPCT 1.4 1.9   Sepsis Labs: Recent  Labs  Lab 05/16/19 1013 05/16/19 1545 05/17/19 0320 05/18/19 0326  PROCALCITON  --  0.76 0.68 0.52  LATICACIDVEN 1.1  --   --   --     Recent Results (from the past 240 hour(s))  Culture, blood (routine x 2)     Status: None (Preliminary result)   Collection Time: 05/16/19 10:13 AM   Specimen: BLOOD  Result Value Ref Range Status   Specimen Description   Final    BLOOD RIGHT ANTECUBITAL Performed at [facility], [address] [place], [place] [address]    Special Requests   Final    BOTTLES DRAWN AEROBIC AND ANAEROBIC Blood Culture adequate volume Performed at [facility], [address] [place], [place] [address]    Culture   Final    NO GROWTH 2 DAYS Performed at [facility], [place] [address], [place], [place] [address]    Report Status PENDING  Incomplete   Culture, blood (routine x 2)     Status: None (Preliminary result)   Collection Time: 05/16/19 10:23 AM   Specimen: BLOOD  Result Value Ref Range Status   Specimen Description   Final    BLOOD LEFT ANTECUBITAL Performed at [facility], [address] [place], [place] [address]    Special Requests   Final    BOTTLES DRAWN AEROBIC AND ANAEROBIC Blood Culture adequate volume Performed at [facility], [address] [place], [place] [address]    Culture   Final    NO GROWTH 2 DAYS Performed at [facility], [place] [address] [place], [place] [address]    Report Status PENDING  Incomplete  Urine culture     Status: Abnormal   Collection Time: 05/16/19 10:24 AM   Specimen: Urine, Random  Result Value Ref Range Status   Specimen Description   Final    URINE, RANDOM Performed at [facility], [address] [place], [place] [address]    Special Requests   Final    NONE Performed at [facility], [address] [place], [place] [address]    Culture MULTIPLE SPECIES PRESENT, SUGGEST RECOLLECTION (A)  Final   Report Status 05/17/2019 FINAL  Final  Respiratory Panel by RT PCR (Flu A&B, Covid) - Nasopharyngeal Swab     Status: None   Collection Time: 05/16/19 12:01 PM   Specimen: Nasopharyngeal Swab  Result Value Ref Range Status   SARS Coronavirus 2 by RT PCR NEGATIVE NEGATIVE Final    Comment: (NOTE) SARS-CoV-2 target nucleic acids are NOT DETECTED. The SARS-CoV-2 RNA is generally detectable in upper respiratoy specimens during the acute phase of infection. The lowest concentration of SARS-CoV-2 viral copies this assay can detect is 131 copies/mL. A negative result does not preclude SARS-Cov-2 infection and should not be used as the sole basis for treatment or other patient management decisions. A negative result may occur with  improper specimen collection/handling, submission of specimen other than nasopharyngeal swab,  presence of viral mutation(s) within the areas targeted by this assay, and inadequate number of viral copies (<131 copies/mL). A negative result must be combined with clinical observations, patient history, and epidemiological information. The expected result is Negative. Fact Sheet for Patients:  PinkCheek.be Fact Sheet for Healthcare Providers:  GravelBags.it This test is not yet ap proved or cleared by the [place] FDA and  has been authorized for detection and/or diagnosis  of SARS-CoV-2 by FDA under an Emergency Use Authorization (EUA). This EUA will remain  in effect (meaning this test can be used) for the duration of the COVID-19 declaration under Section 564(b)(1) of the Act, 21 U.S.C. section 360bbb-3(b)(1), unless the authorization is terminated or revoked sooner.    Influenza A by PCR NEGATIVE NEGATIVE Final   Influenza B by PCR NEGATIVE NEGATIVE Final    Comment: (NOTE) The Xpert Xpress SARS-CoV-2/FLU/RSV assay is intended as an aid in  the diagnosis of influenza from Nasopharyngeal swab specimens and  should not be used as a sole basis for treatment. Nasal washings and  aspirates are unacceptable for Xpert Xpress SARS-CoV-2/FLU/RSV  testing. Fact Sheet for Patients: PinkCheek.be Fact Sheet for Healthcare Providers: GravelBags.it This test is not yet approved or cleared by the [place] FDA and  has been authorized for detection and/or diagnosis of SARS-CoV-2 by  FDA under an Emergency Use Authorization (EUA). This EUA will remain  in effect (meaning this test can be used) for the duration of the  Covid-19 declaration under Section 564(b)(1) of the Act, 21  U.S.C. section 360bbb-3(b)(1), unless the authorization is  terminated or revoked. Performed at [facility], [address] [place], [place] [address]       Radiology  Studies: IR Fluoro Guide CV Line Right  Result Date: 05/17/2019 INDICATION: Recurrent TTP, access for plasmapheresis EXAM: ULTRASOUND GUIDANCE FOR VASCULAR ACCESS RIGHT IJ TEMPORARY MAHURKAR CATHETER FOR PLASMAPHERESIS MEDICATIONS: 1% LIDOCAINE LOCAL ANESTHESIA/SEDATION: Moderate Sedation Time: None. The patient's level of consciousness and vital signs were monitored continuously by radiology nursing throughout the procedure under my direct supervision. FLUOROSCOPY TIME:  Fluoroscopy Time: 0 minutes 42 seconds (1.0 mGy). COMPLICATIONS: None immediate. PROCEDURE: Informed written consent was obtained from the patient after a thorough discussion of the procedural risks, benefits and alternatives. All questions were addressed. Maximal Sterile Barrier Technique was utilized including caps, mask, sterile gowns, sterile gloves, sterile drape, hand hygiene and skin antiseptic. A timeout was performed prior to the initiation of the procedure. Under sterile conditions and local anesthesia, ultrasound micropuncture access performed of the patent right internal jugular vein. Images obtained for documentation of the patent right internal jugular vein. 018 guidewire advanced followed by the micro dilator. Amplatz guidewire exchange performed. Tract dilatation performed to insert a 16 cm Mahurkar temporary catheter for plasmapheresis. Tip position at the SVC RA junction. Images obtained for documentation. Catheter secured with Prolene sutures and a sterile dressing. Blood aspirated easily from all lumens followed by saline and heparin flushes. External caps applied. No immediate complication. Patient tolerated the procedure well. IMPRESSION: Successful ultrasound and fluoroscopic right IJ temporary mahurkar catheter for plasmapheresis. Ready for use. Electronically Signed   By: Jerilynn Mages.  Shick M.D.   On: 05/17/2019 09:17   IR US Guide Vasc Access Right  Result Date: 05/17/2019 INDICATION: Recurrent TTP, access for plasmapheresis  EXAM: ULTRASOUND GUIDANCE FOR VASCULAR ACCESS RIGHT IJ TEMPORARY MAHURKAR CATHETER FOR PLASMAPHERESIS MEDICATIONS: 1% LIDOCAINE LOCAL ANESTHESIA/SEDATION: Moderate Sedation Time: None. The patient's level of consciousness and vital signs were monitored continuously by radiology nursing throughout the procedure under my direct supervision. FLUOROSCOPY TIME:  Fluoroscopy Time: 0 minutes 42 seconds (1.0 mGy). COMPLICATIONS: None immediate. PROCEDURE: Informed written consent was obtained from the patient after a thorough discussion of the procedural risks, benefits and alternatives. All questions were addressed. Maximal Sterile Barrier Technique was utilized including caps, mask, sterile gowns, sterile gloves, sterile drape, hand hygiene and skin antiseptic. A timeout was  performed prior to the initiation of the procedure. Under sterile conditions and local anesthesia, ultrasound micropuncture access performed of the patent right internal jugular vein. Images obtained for documentation of the patent right internal jugular vein. 018 guidewire advanced followed by the micro dilator. Amplatz guidewire exchange performed. Tract dilatation performed to insert a 16 cm Mahurkar temporary catheter for plasmapheresis. Tip position at the SVC RA junction. Images obtained for documentation. Catheter secured with Prolene sutures and a sterile dressing. Blood aspirated easily from all lumens followed by saline and heparin flushes. External caps applied. No immediate complication. Patient tolerated the procedure well. IMPRESSION: Successful ultrasound and fluoroscopic right IJ temporary mahurkar catheter for plasmapheresis. Ready for use. Electronically Signed   By: Jerilynn Mages.  Shick M.D.   On: 05/17/2019 09:17     LOS: 3 days   Time spent: More than 50% of that time was spent in counseling and/or coordination of care.  Antonieta Pert, MD [facility]  05/19/2019, 7:38 AM

## 2019-05-19 NOTE — Progress Notes (Signed)
Patient inquiring when she is going to dialysis today for her plasma exchange, Attempted to call dialysis x 4 this morning, unable to reach department at this time. Will continue to try. Patient updated. Lavontae Cornia, Bettina Gavia RN

## 2019-05-20 ENCOUNTER — Inpatient Hospital Stay (HOSPITAL_COMMUNITY): Payer: Federal, State, Local not specified - PPO

## 2019-05-20 LAB — BASIC METABOLIC PANEL
Anion gap: 10 (ref 5–15)
BUN: 8 mg/dL (ref 6–20)
CO2: 28 mmol/L (ref 22–32)
Calcium: 9.1 mg/dL (ref 8.9–10.3)
Chloride: 100 mmol/L (ref 98–111)
Creatinine, Ser: 0.81 mg/dL (ref 0.44–1.00)
GFR calc Af Amer: >60 mL/min (ref >60)
GFR calc non Af Amer: >60 mL/min (ref >60)
Glucose, Bld: 228 mg/dL — ABNORMAL HIGH (ref 70–99)
Potassium: 3.5 mmol/L (ref 3.5–5.1)
Sodium: 138 mmol/L (ref 135–145)

## 2019-05-20 LAB — THERAPEUTIC PLASMA EXCHANGE (BLOOD BANK)
Plasma Exchange: 2343
Plasma volume needed: 2342
Unit division: 0
Unit division: 0
Unit division: 0
Unit division: 0
Unit division: 0
Unit division: 0
Unit division: 0

## 2019-05-20 LAB — CBC WITH DIFFERENTIAL/PLATELET
Abs Immature Granulocytes: 0.01 10*3/uL (ref 0.00–0.07)
Basophils Absolute: 0 10*3/uL (ref 0.0–0.1)
Basophils Relative: 0 %
Eosinophils Absolute: 0.1 10*3/uL (ref 0.0–0.5)
Eosinophils Relative: 6 %
HCT: 25.5 % — ABNORMAL LOW (ref 36.0–46.0)
Hemoglobin: 8.5 g/dL — ABNORMAL LOW (ref 12.0–15.0)
Immature Granulocytes: 1 %
Lymphocytes Relative: 23 %
Lymphs Abs: 0.3 10*3/uL — ABNORMAL LOW (ref 0.7–4.0)
MCH: 30.9 pg (ref 26.0–34.0)
MCHC: 33.3 g/dL (ref 30.0–36.0)
MCV: 92.7 fL (ref 80.0–100.0)
Monocytes Absolute: 0.2 10*3/uL (ref 0.1–1.0)
Monocytes Relative: 14 %
Neutro Abs: 0.8 10*3/uL — ABNORMAL LOW (ref 1.7–7.7)
Neutrophils Relative %: 56 %
Platelets: 42 10*3/uL — ABNORMAL LOW (ref 150–400)
RBC: 2.75 MIL/uL — ABNORMAL LOW (ref 3.87–5.11)
RDW: 12.5 % (ref 11.5–15.5)
WBC: 1.4 10*3/uL — CL (ref 4.0–10.5)
nRBC: 0 % (ref 0.0–0.2)

## 2019-05-20 LAB — COMPREHENSIVE METABOLIC PANEL
ALT: 29 U/L (ref 0–44)
AST: 52 U/L — ABNORMAL HIGH (ref 15–41)
Albumin: 3 g/dL — ABNORMAL LOW (ref 3.5–5.0)
Alkaline Phosphatase: 54 U/L (ref 38–126)
Anion gap: 7 (ref 5–15)
BUN: 8 mg/dL (ref 6–20)
CO2: 30 mmol/L (ref 22–32)
Calcium: 8.9 mg/dL (ref 8.9–10.3)
Chloride: 102 mmol/L (ref 98–111)
Creatinine, Ser: 0.84 mg/dL (ref 0.44–1.00)
GFR calc Af Amer: >60 mL/min (ref >60)
GFR calc non Af Amer: >60 mL/min (ref >60)
Glucose, Bld: 156 mg/dL — ABNORMAL HIGH (ref 70–99)
Potassium: 3.6 mmol/L (ref 3.5–5.1)
Sodium: 139 mmol/L (ref 135–145)
Total Bilirubin: 0.9 mg/dL (ref 0.3–1.2)
Total Protein: 5.3 g/dL — ABNORMAL LOW (ref 6.5–8.1)

## 2019-05-20 LAB — RETICULOCYTES
Immature Retic Fract: 12.3 % (ref 2.3–15.9)
RBC.: 2.75 MIL/uL — ABNORMAL LOW (ref 3.87–5.11)
Retic Count, Absolute: 44.8 10*3/uL (ref 19.0–186.0)
Retic Ct Pct: 1.6 % (ref 0.4–3.1)

## 2019-05-20 LAB — GLUCOSE, CAPILLARY
Glucose-Capillary: 156 mg/dL — ABNORMAL HIGH (ref 70–99)
Glucose-Capillary: 163 mg/dL — ABNORMAL HIGH (ref 70–99)
Glucose-Capillary: 170 mg/dL — ABNORMAL HIGH (ref 70–99)
Glucose-Capillary: 185 mg/dL — ABNORMAL HIGH (ref 70–99)

## 2019-05-20 LAB — LACTATE DEHYDROGENASE: LDH: 194 U/L — ABNORMAL HIGH (ref 98–192)

## 2019-05-20 MED ORDER — CALCIUM CARBONATE ANTACID 500 MG PO CHEW
CHEWABLE_TABLET | ORAL | Status: AC
  Administered 2019-05-20: 400 mg via ORAL
  Filled 2019-05-20: qty 4

## 2019-05-20 MED ORDER — CALCIUM CARBONATE ANTACID 500 MG PO CHEW
2.0000 | CHEWABLE_TABLET | ORAL | Status: AC
  Filled 2019-05-20: qty 2

## 2019-05-20 MED ORDER — ACD FORMULA A 0.73-2.45-2.2 GM/100ML VI SOLN
Status: AC
  Administered 2019-05-20: 1000 mL
  Filled 2019-05-20: qty 500

## 2019-05-20 MED ORDER — DIPHENHYDRAMINE HCL 25 MG PO CAPS
ORAL_CAPSULE | ORAL | Status: AC
  Administered 2019-05-20: 25 mg via ORAL
  Filled 2019-05-20: qty 1

## 2019-05-20 MED ORDER — SODIUM CHLORIDE 0.9 % IV SOLN: 2.0000 g | Freq: Once | INTRAVENOUS | Status: DC

## 2019-05-20 MED ORDER — ACD FORMULA A 0.73-2.45-2.2 GM/100ML VI SOLN
1000.0000 mL | Status: DC
  Filled 2019-05-20: qty 1000

## 2019-05-20 MED ORDER — ACETAMINOPHEN 325 MG PO TABS: 650.0000 mg | ORAL_TABLET | ORAL | Status: DC | PRN

## 2019-05-20 MED ORDER — FUROSEMIDE 20 MG PO TABS
20.0000 mg | ORAL_TABLET | Freq: Every day | ORAL | Status: DC
  Administered 2019-05-20 – 2019-05-21 (×2): 20 mg via ORAL
  Filled 2019-05-20 (×2): qty 1

## 2019-05-20 MED ORDER — DIPHENHYDRAMINE HCL 25 MG PO CAPS: 25.0000 mg | ORAL_CAPSULE | Freq: Four times a day (QID) | ORAL | Status: DC | PRN

## 2019-05-20 MED ORDER — CALCIUM GLUCONATE-NACL 2-0.675 GM/100ML-% IV SOLN
2.0000 g | Freq: Once | INTRAVENOUS | Status: AC
  Filled 2019-05-20: qty 100

## 2019-05-20 MED ORDER — ANTICOAGULANT SODIUM CITRATE 4% (200MG/5ML) IV SOLN
5.0000 mL | Freq: Once | Status: DC
  Filled 2019-05-20: qty 5

## 2019-05-20 NOTE — Progress Notes (Signed)
Overall, Vicki [name] continues to do well.  Her platelet count is 42,000 today.  Her LDH is down 194,000.  I noted that her white cell count is a little on the low side.  I am sure this is probably reflective of her liver disease.  There may also be part of getting all of her plasma exchanges.  We will have to watch this closely.  If the white cell count drops more, then we will give her Neupogen.  Her BUN is 8 and creatinine 0.84.  Her potassium 3.6.  Her albumin is 3.  We will continue her on daily plasma exchanges for right now.  She will get her Rituxan on Wednesday.  There is no diarrhea.  Her T-max yesterday was 100.9.[name]  She has had a decent appetite.  There is no nausea or vomiting.  She has had no rashes.  On her physical exam, her temperature is 99.  Pulse 93.  Blood pressure 110/56.  Her head neck exam shows no ocular or oral lesions.  There are no palpable cervical or supraclavicular lymph nodes.  Lungs are clear.  There may be some wheezes over on the right side.  We may need to get a chest x-ray.  Cardiac exam regular rate and rhythm.  Abdomen is soft.  Extremities shows no clubbing, cyanosis or edema.  Neurological exam is not focal.  Vicki [name] has the relapsed TTP.  She is on daily plasma exchanges.  Her platelet count is improving.  Again, on Wednesday we will have Rituxan.  She is already had Rituxan in the office so she should be able to get the Rituxan over a couple hours.  I know that she is getting outstanding care from everybody on 4 E.  I appreciate everybody's help.   Lattie Haw, MD  Job 23:10

## 2019-05-20 NOTE — Consult Note (Signed)
   [facility] CM Inpatient Consult   05/20/2019  Vicki [name] March 25, 1960 [id number]  Patient screened for 9 hospitalizations in the past 6 months  In the [place] roster with [employer] to assess for Care Management service needs. However, the admissions were for dialysis as noted.   Review of patient's medical record reveals patient is at Medium risk score.  Primary Care Provider is [name], [facility], this office provides the transition of care follow up.  Plan:  Continue to follow progress and disposition to assess for post hospital care management needs.    Please place a [facility] Care Management consult as appropriate and for questions contact:   Natividad Brood, RN BSN [facility] Liaison  [phone] business mobile phone Toll free office [phone]  Fax number: [phone] [email or web address]

## 2019-05-20 NOTE — Progress Notes (Signed)
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  Patient was a started on her first infusion of rituximab 3/10.  She felt fine but noted rash on left arm and fever next day on 3/11.  Patient was seen by PCP labs showed worsening thrombocytopenia and leukopenia Dr. Marin Olp from oncology was contacted by PCP and patient was admitted to [facility]. In [place] [facility]: Patient was febrile to 100.62F. BP's mildly elevated. Vitals otherwise stable. CBC with Plt 13, Hgb 10.2, WBC 1.6. CMP WNL. Lactate WNL. UA without evidence of infection. Blood cx drawn.  CXR without acute cardiopulmonary abnormality.   Subjective: No new complaints.  No new rashes.  Reports some bleeding at the right subclavian site but dressing intact- dried blood + Overnight low-grade fever T-max 100.9, saturating well on room air blood pressure is stable.  Tachypneic mildly Platelets up at 42k LDH 369->227->206->194.    Assessment & Plan:  TTP with relapse: With severe thrombocytopenia. Hematology oncology on board and started on  plasma exchange 05/17/19.Continue same  for 7 days.Platelet increasing, hb is stable, no signs of bleeding. No petechial hemorrhage/ecchymosis present.Continue plan as per hematology.  Recent Labs  Lab 05/16/19 1013 05/17/19 0320 05/18/19 0326 05/19/19 0444 05/20/19 0300  PLT 13* 11* 18* 38* 42*   Leukopenia/neutropenia in the setting of #1. ANC is at 1200->1100->1400->800.  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 Continue neutropenic precaution.  Monitor closely, appreciate oncology input. Recent Labs  Lab 05/16/19 1013 05/17/19 0320 05/18/19 0326 05/19/19 0444 05/20/19 0300  WBC 1.6* 1.8* 1.8* 2.0* 1.4*    Hypokalemia:Repleted  Controlled type 2 diabetes mellitus without complication, hemoglobin A1c stable at 6.8.  Blood sugar overall stable.We will continue sliding scale insulin.  At home on glimepiride, linagliptin.  She wants to take home linagliptin. Recent Labs  Lab 05/19/19 1149 05/19/19 1552 05/19/19 1938 05/19/19 2151 05/20/19 0607  GLUCAP 195* 156* 203* 167* 163*   Liver steatosis and nodular contour cystic of some underlying cirrhotic changes- had [place] abd in 03/03/19 secondary to NASH, LFTs up, ast/alt elevated 107/50.  Monitor.  Cellulitis left arm in the setting of fever and leukopenia.  On Keflex.  Seems to have improved.  Small area of mucosal hematoma on the left upper jaw- monitor  Hypertension:BP stable resume Lasix at half home dose which is 20 mg daily.    Body mass index is 27.9 kg/m.   DVT prophylaxis:SCD Code Status:full Family Communication: plan of care discussed with patient at bedside. Disposition Plan: Patient is from:home Anticipated Disposition: to home Barriers to discharge or conditions that needs to be met prior to discharge: Patient admitted with severe thrombocytopenia, relapse of TTP remains in the hospital for ongoing management with plasma exchange x for 7 days, serial monitoring, will be in the hospital pending further stabilization and clearance by hematology  Consultants: hematology/oncology Procedures:see note Microbiology:see note  Medications: Scheduled Meds: . calcium carbonate  2 tablet Oral Q3H  . cephALEXin  500 mg Oral Q6H  . Chlorhexidine Gluconate Cloth  6 each Topical Daily  . folic acid  2 mg Oral Daily  . insulin aspart  0-15 Units Subcutaneous TID WC  . sitaGLIPtin  100 mg Oral Daily   Continuous Infusions: . anticoagulant sodium citrate    .  calcium gluconate    . citrate dextrose    . citrate dextrose      Antimicrobials: Anti-infectives (From admission, onward)   Start     Dose/Rate Route Frequency Ordered  Stop   05/16/19 1800  cephALEXin (KEFLEX) capsule 500 mg     500 mg Oral Every 6 hours 05/16/19 1636 05/21/19 1759       Objective: Vitals: Today's Vitals   05/19/19 1901 05/19/19 2028 05/20/19 0305 05/20/19 0609  BP: (!) 102/51 120/66 (!) 110/56   Pulse:  92 93   Resp: (!) 24 20    Temp:  100.2 F (37.9 C) 99 F (37.2 C)   TempSrc:  Oral Oral   SpO2:  98% 98%   Weight:    69.2 kg  Height:      PainSc:  0-No pain 0-No pain     Intake/Output Summary (Last 24 hours) at 05/20/2019 0729 Last data filed at 05/19/2019 2100 Gross per 24 hour  Intake 120 ml  Output --  Net 120 ml   Filed Weights   05/16/19 0937 05/20/19 0609  Weight: 67.1 kg 69.2 kg   Weight change:    Intake/Output from previous day: 03/14 0701 - 03/15 0700 In: 120 [P.O.:120] Out: -  Intake/Output this shift: No intake/output data recorded.  Examination:  General exam: AAO X3, NAD, weak appearing. HEENT:Oral mucosa moist, Ear/Nose WNL grossly, dentition normal. Respiratory system: bilaterally clear, no use of accessory muscle Cardiovascular system: S1 & S2 +, No JVD,. Gastrointestinal system: Abdomen soft, NT,ND, BS+ Nervous System:Alert, awake, moving extremities and grossly nonfocal Extremities: No edema, distal peripheral pulses palpable.  Skin: No rashes,no icterus. MSK: Normal muscle bulk,tone, power  Data Reviewed: I have personally reviewed following labs and imaging studies CBC: Recent Labs  Lab 05/16/19 1013 05/17/19 0320 05/18/19 0326 05/19/19 0444 05/20/19 0300  WBC 1.6* 1.8* 1.8* 2.0* 1.4*  NEUTROABS 1.1* 1.2* 1.1* 1.4* 0.8*  HGB 10.2* 10.0* 10.2* 10.0* 8.5*  HCT 29.5* 29.8* 30.2* 29.2* 25.5*  MCV 92.5 91.1 92.4 91.3 92.7  PLT 13* 11* 18* 38* 42*   Basic Metabolic Panel: Recent Labs  Lab 05/17/19 0320 05/17/19 1025 05/18/19 0326 05/19/19 0444 05/20/19 0300  NA 136 136 136 137 139  K 3.8 3.7 3.8 3.3* 3.6  CL 101 103 100 101 102  CO2 24 22 27 28 30   GLUCOSE 175*  176* 185* 156* 156*  BUN 8 9 17 8 8   CREATININE 0.82 0.82 0.84 0.80 0.84  CALCIUM 9.3 9.7 9.6 9.2 8.9   GFR: Estimated Creatinine Clearance: 65.7 mL/min (by C-G formula based on SCr of 0.84 mg/dL). Liver Function Tests: Recent Labs  Lab 05/16/19 1013 05/17/19 0320 05/18/19 0326 05/19/19 0444 05/20/19 0300  AST 70* 107* 54* 51* 52*  ALT 37 50* 28 27 29   ALKPHOS 99 78 57 52 54  BILITOT 1.8* 1.6* 1.3* 0.9 0.9  PROT 7.8 7.9 6.3* 5.9* 5.3*  ALBUMIN 3.5 3.4* 3.3* 3.2* 3.0*   No results for input(s): LIPASE, AMYLASE in the last 168 hours. No results for input(s): AMMONIA in the last 168 hours. Coagulation Profile: No results for input(s): INR, PROTIME in the last 168 hours. Cardiac Enzymes: No results for input(s): CKTOTAL, CKMB, CKMBINDEX, TROPONINI in the last 168 hours. BNP (last 3 results) No results for input(s): PROBNP in the last 8760 hours. HbA1C: No results for input(s): HGBA1C in the last 72 hours. CBG: Recent Labs  Lab 05/19/19 1149 05/19/19 1552 05/19/19 1938 05/19/19 2151  05/20/19 0607  GLUCAP 195* 156* 203* 167* 163*   Lipid Profile: No results for input(s): CHOL, HDL, LDLCALC, TRIG, CHOLHDL, LDLDIRECT in the last 72 hours. Thyroid Function Tests: No results for input(s): TSH, T4TOTAL, FREET4, T3FREE, THYROIDAB in the last 72 hours. Anemia Panel: Recent Labs    05/19/19 0443 05/20/19 0300  RETICCTPCT 1.9 1.6   Sepsis Labs: Recent Labs  Lab 05/16/19 1013 05/16/19 1545 05/17/19 0320 05/18/19 0326  PROCALCITON  --  0.76 0.68 0.52  LATICACIDVEN 1.1  --   --   --     Recent Results (from the past 240 hour(s))  Culture, blood (routine x 2)     Status: None (Preliminary result)   Collection Time: 05/16/19 10:13 AM   Specimen: BLOOD  Result Value Ref Range Status   Specimen Description   Final    BLOOD RIGHT ANTECUBITAL Performed at [facility], [address] [place], [place] [address]    Special Requests   Final    BOTTLES DRAWN  AEROBIC AND ANAEROBIC Blood Culture adequate volume Performed at [facility], [address] [place], [place] [address]    Culture   Final    NO GROWTH 3 DAYS Performed at [facility], [place] [address] [place], [place] [address]    Report Status PENDING  Incomplete  Culture, blood (routine x 2)     Status: None (Preliminary result)   Collection Time: 05/16/19 10:23 AM   Specimen: BLOOD  Result Value Ref Range Status   Specimen Description   Final    BLOOD LEFT ANTECUBITAL Performed at [facility], [address] [place], [place] [address]    Special Requests   Final    BOTTLES DRAWN AEROBIC AND ANAEROBIC Blood Culture adequate volume Performed at [facility], [address] [place], [place] [address]    Culture   Final    NO GROWTH 3 DAYS Performed at [facility], [place] [address] [place], [place] [address]    Report Status PENDING  Incomplete  Urine culture     Status: Abnormal   Collection Time: 05/16/19 10:24 AM   Specimen: Urine, Random  Result Value Ref Range Status   Specimen Description   Final    URINE, RANDOM Performed at [facility], [address] [place], [place] [address]    Special Requests   Final    NONE Performed at [facility], [address] [place], [place] [address]    Culture MULTIPLE SPECIES PRESENT, SUGGEST RECOLLECTION (A)  Final   Report Status 05/17/2019 FINAL  Final  Respiratory Panel by RT PCR (Flu A&B, Covid) - Nasopharyngeal Swab     Status: None   Collection Time: 05/16/19 12:01 PM   Specimen: Nasopharyngeal Swab  Result Value Ref Range Status   SARS Coronavirus 2 by RT PCR NEGATIVE NEGATIVE Final    Comment: (NOTE) SARS-CoV-2 target nucleic acids are NOT DETECTED. The SARS-CoV-2 RNA is generally detectable in upper respiratoy specimens during the acute phase of infection. The lowest concentration of SARS-CoV-2 viral copies this assay can detect is 131  copies/mL. A negative result does not preclude SARS-Cov-2 infection and should not be used as the sole basis for treatment or other patient management decisions. A negative result may occur with  improper specimen collection/handling, submission of specimen other than nasopharyngeal swab, presence of viral mutation(s) within the areas targeted by  this assay, and inadequate number of viral copies (<131 copies/mL). A negative result must be combined with clinical observations, patient history, and epidemiological information. The expected result is Negative. Fact Sheet for Patients:  PinkCheek.be Fact Sheet for Healthcare Providers:  GravelBags.it This test is not yet ap proved or cleared by the [place] FDA and  has been authorized for detection and/or diagnosis of SARS-CoV-2 by FDA under an Emergency Use Authorization (EUA). This EUA will remain  in effect (meaning this test can be used) for the duration of the COVID-19 declaration under Section 564(b)(1) of the Act, 21 U.S.C. section 360bbb-3(b)(1), unless the authorization is terminated or revoked sooner.    Influenza A by PCR NEGATIVE NEGATIVE Final   Influenza B by PCR NEGATIVE NEGATIVE Final    Comment: (NOTE) The Xpert Xpress SARS-CoV-2/FLU/RSV assay is intended as an aid in  the diagnosis of influenza from Nasopharyngeal swab specimens and  should not be used as a sole basis for treatment. Nasal washings and  aspirates are unacceptable for Xpert Xpress SARS-CoV-2/FLU/RSV  testing. Fact Sheet for Patients: PinkCheek.be Fact Sheet for Healthcare Providers: GravelBags.it This test is not yet approved or cleared by the [place] FDA and  has been authorized for detection and/or diagnosis of SARS-CoV-2 by  FDA under an Emergency Use Authorization (EUA). This EUA will remain  in effect (meaning this test can  be used) for the duration of the  Covid-19 declaration under Section 564(b)(1) of the Act, 21  U.S.C. section 360bbb-3(b)(1), unless the authorization is  terminated or revoked. Performed at [facility], [address] [place], [place] [address]       Radiology Studies: No results found.   LOS: 4 days   Time spent: More than 50% of that time was spent in counseling and/or coordination of care.  Antonieta Pert, MD [facility]  05/20/2019, 7:29 AM

## 2019-05-21 LAB — BASIC METABOLIC PANEL
Anion gap: 8 (ref 5–15)
BUN: 10 mg/dL (ref 6–20)
CO2: 27 mmol/L (ref 22–32)
Calcium: 9 mg/dL (ref 8.9–10.3)
Chloride: 105 mmol/L (ref 98–111)
Creatinine, Ser: 0.82 mg/dL (ref 0.44–1.00)
GFR calc Af Amer: >60 mL/min (ref >60)
GFR calc non Af Amer: >60 mL/min (ref >60)
Glucose, Bld: 169 mg/dL — ABNORMAL HIGH (ref 70–99)
Potassium: 3.8 mmol/L (ref 3.5–5.1)
Sodium: 140 mmol/L (ref 135–145)

## 2019-05-21 LAB — THERAPEUTIC PLASMA EXCHANGE (BLOOD BANK)
Plasma Exchange: 3000
Plasma volume needed: 3000
Unit division: 0
Unit division: 0
Unit division: 0
Unit division: 0
Unit division: 0
Unit division: 0
Unit division: 0
Unit division: 0
Unit division: 0

## 2019-05-21 LAB — CBC WITH DIFFERENTIAL/PLATELET
Abs Immature Granulocytes: 0 10*3/uL (ref 0.00–0.07)
Basophils Absolute: 0 10*3/uL (ref 0.0–0.1)
Basophils Relative: 1 %
Eosinophils Absolute: 0.1 10*3/uL (ref 0.0–0.5)
Eosinophils Relative: 9 %
HCT: 27.8 % — ABNORMAL LOW (ref 36.0–46.0)
Hemoglobin: 9.1 g/dL — ABNORMAL LOW (ref 12.0–15.0)
Immature Granulocytes: 0 %
Lymphocytes Relative: 40 %
Lymphs Abs: 0.6 10*3/uL — ABNORMAL LOW (ref 0.7–4.0)
MCH: 31 pg (ref 26.0–34.0)
MCHC: 32.7 g/dL (ref 30.0–36.0)
MCV: 94.6 fL (ref 80.0–100.0)
Monocytes Absolute: 0.1 10*3/uL (ref 0.1–1.0)
Monocytes Relative: 10 %
Neutro Abs: 0.6 10*3/uL — ABNORMAL LOW (ref 1.7–7.7)
Neutrophils Relative %: 40 %
Platelets: 58 10*3/uL — ABNORMAL LOW (ref 150–400)
RBC: 2.94 MIL/uL — ABNORMAL LOW (ref 3.87–5.11)
RDW: 12.6 % (ref 11.5–15.5)
WBC: 1.4 10*3/uL — CL (ref 4.0–10.5)
nRBC: 0 % (ref 0.0–0.2)

## 2019-05-21 LAB — GLUCOSE, CAPILLARY
Glucose-Capillary: 124 mg/dL — ABNORMAL HIGH (ref 70–99)
Glucose-Capillary: 125 mg/dL — ABNORMAL HIGH (ref 70–99)
Glucose-Capillary: 262 mg/dL — ABNORMAL HIGH (ref 70–99)

## 2019-05-21 LAB — LACTATE DEHYDROGENASE: LDH: 186 U/L (ref 98–192)

## 2019-05-21 LAB — COMPREHENSIVE METABOLIC PANEL
ALT: 30 U/L (ref 0–44)
AST: 50 U/L — ABNORMAL HIGH (ref 15–41)
Albumin: 3.1 g/dL — ABNORMAL LOW (ref 3.5–5.0)
Alkaline Phosphatase: 59 U/L (ref 38–126)
Anion gap: 7 (ref 5–15)
BUN: 9 mg/dL (ref 6–20)
CO2: 29 mmol/L (ref 22–32)
Calcium: 9.1 mg/dL (ref 8.9–10.3)
Chloride: 104 mmol/L (ref 98–111)
Creatinine, Ser: 0.98 mg/dL (ref 0.44–1.00)
GFR calc Af Amer: >60 mL/min (ref >60)
GFR calc non Af Amer: >60 mL/min (ref >60)
Glucose, Bld: 202 mg/dL — ABNORMAL HIGH (ref 70–99)
Potassium: 3.8 mmol/L (ref 3.5–5.1)
Sodium: 140 mmol/L (ref 135–145)
Total Bilirubin: 0.7 mg/dL (ref 0.3–1.2)
Total Protein: 5.6 g/dL — ABNORMAL LOW (ref 6.5–8.1)

## 2019-05-21 LAB — CULTURE, BLOOD (ROUTINE X 2)
Culture: NO GROWTH
Culture: NO GROWTH
Special Requests: ADEQUATE
Special Requests: ADEQUATE

## 2019-05-21 LAB — RETICULOCYTES
Immature Retic Fract: 10.6 % (ref 2.3–15.9)
RBC.: 2.92 MIL/uL — ABNORMAL LOW (ref 3.87–5.11)
Retic Count, Absolute: 42.6 10*3/uL (ref 19.0–186.0)
Retic Ct Pct: 1.5 % (ref 0.4–3.1)

## 2019-05-21 MED ORDER — SODIUM CHLORIDE 0.9 % IV SOLN
375.0000 mg/m2 | Freq: Once | INTRAVENOUS | Status: AC
  Administered 2019-05-22: 700 mg via INTRAVENOUS
  Filled 2019-05-21: qty 70

## 2019-05-21 MED ORDER — NYSTATIN 100000 UNIT/GM EX CREA
TOPICAL_CREAM | Freq: Two times a day (BID) | CUTANEOUS | Status: DC
  Administered 2019-05-21: 1 via TOPICAL
  Filled 2019-05-21: qty 15

## 2019-05-21 MED ORDER — SODIUM CHLORIDE 0.9 % IV SOLN
40.0000 mg | Freq: Once | INTRAVENOUS | Status: AC
  Administered 2019-05-22: 40 mg via INTRAVENOUS
  Filled 2019-05-21: qty 4

## 2019-05-21 MED ORDER — ACETAMINOPHEN 325 MG PO TABS: 650.0000 mg | ORAL_TABLET | ORAL | Status: DC | PRN

## 2019-05-21 MED ORDER — DIPHENHYDRAMINE HCL 25 MG PO CAPS
ORAL_CAPSULE | ORAL | Status: AC
  Filled 2019-05-21: qty 1

## 2019-05-21 MED ORDER — ACD FORMULA A 0.73-2.45-2.2 GM/100ML VI SOLN
Status: AC
  Filled 2019-05-21: qty 500

## 2019-05-21 MED ORDER — CALCIUM CARBONATE ANTACID 500 MG PO CHEW
2.0000 | CHEWABLE_TABLET | ORAL | Status: AC
  Administered 2019-05-21: 400 mg via ORAL

## 2019-05-21 MED ORDER — SODIUM CHLORIDE 0.9 % IV SOLN
2.0000 g | Freq: Once | INTRAVENOUS | Status: AC
  Administered 2019-05-21: 2 g via INTRAVENOUS
  Filled 2019-05-21 (×2): qty 20

## 2019-05-21 MED ORDER — CALCIUM CARBONATE ANTACID 500 MG PO CHEW
CHEWABLE_TABLET | ORAL | Status: AC
  Filled 2019-05-21: qty 2

## 2019-05-21 MED ORDER — DIPHENHYDRAMINE HCL 25 MG PO CAPS
25.0000 mg | ORAL_CAPSULE | Freq: Four times a day (QID) | ORAL | Status: DC | PRN
  Administered 2019-05-21 (×2): 25 mg via ORAL
  Filled 2019-05-21 (×2): qty 1

## 2019-05-21 MED ORDER — METHYLPREDNISOLONE SODIUM SUCC 125 MG IJ SOLR
80.0000 mg | Freq: Once | INTRAMUSCULAR | Status: AC
  Administered 2019-05-22: 80 mg via INTRAVENOUS
  Filled 2019-05-21: qty 2

## 2019-05-21 MED ORDER — FUROSEMIDE 40 MG PO TABS
40.0000 mg | ORAL_TABLET | Freq: Every day | ORAL | Status: DC
  Administered 2019-05-22 – 2019-05-24 (×3): 40 mg via ORAL
  Filled 2019-05-21 (×3): qty 1

## 2019-05-21 MED ORDER — ANTICOAGULANT SODIUM CITRATE 4% (200MG/5ML) IV SOLN
5.0000 mL | Freq: Once | Status: AC
  Administered 2019-05-21: 5 mL
  Filled 2019-05-21: qty 5

## 2019-05-21 MED ORDER — ACD FORMULA A 0.73-2.45-2.2 GM/100ML VI SOLN
1000.0000 mL | Status: DC
  Administered 2019-05-21: 1000 mL
  Filled 2019-05-21: qty 1000

## 2019-05-21 NOTE — Progress Notes (Signed)
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  Patient was a started on her first infusion of rituximab 3/10.  She felt fine but noted rash on left arm and fever next day on 3/11.  Patient was seen by PCP labs showed worsening thrombocytopenia and leukopenia Dr. Marin Olp from oncology was contacted by PCP and patient was admitted to [facility]. In [place] ED: Patient was febrile to 100.51F. BP's mildly elevated. Vitals otherwise stable. CBC with Plt 13, Hgb 10.2, WBC 1.6. CMP WNL. Lactate WNL. UA without evidence of infection. Blood cx drawn.  CXR without acute cardiopulmonary abnormality.   Subjective:  Small rash on the thigh this morning.  No new complaints.  Resting well.  ?  Fungal infection in her foot.  Platelets up at 58 k LDH 369->227->206->194->186. Bleeding stopped from the chest catheter site dressing intact Assessment & Plan:  TTP with relapse: With severe thrombocytopenia. Hematology oncology on board and started on  plasma exchange 05/17/19.Continue daily plasma exchange plan for total 7 days.Platelet increasing, hb is stable, no signs of bleeding. No petechial hemorrhage/ecchymosis present.Continue plan as per hematology.  To get rituximab second dose 3/17. Recent Labs  Lab 05/17/19 0320 05/18/19 0326 05/19/19 0444 05/20/19 0300 05/21/19 0409  PLT 11* 18* 38* 42* 58*   Leukopenia/neutropenia in the setting of #1. ANC is at 1200->1100->1400->800.  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 Continue neutropenic precaution.  Monitor closely, appreciate oncology input. Recent Labs  Lab 05/17/19 0320 05/18/19 0326 05/19/19 0444 05/20/19 0300 05/21/19 0409  WBC 1.8* 1.8* 2.0* 1.4* 1.4*    Hypokalemia:Repleted  Controlled type 2 diabetes mellitus without complication, hemoglobin A1c stable at 6.8.  Blood sugar overall stable.We will continue sliding scale insulin.  At home on glimepiride, linagliptin.  She wants to take home linagliptin. Recent Labs  Lab 05/20/19 1351 05/20/19 1649 05/20/19 2347 05/21/19 0715 05/21/19 1122  GLUCAP 185* 170* 156* 124* 125*   Liver steatosis and nodular contour cystic of some underlying cirrhotic changes- had [place] abd in 03/03/19 secondary to NASH, LFTs up, ast/alt elevated 107/50.  Monitor.  Cellulitis left arm in the setting of fever and leukopenia.  On Keflex x 5 days.  Seems to have improved.  ?  Fungal infection of the left foot, added nystatin cream.  Small area of mucosal hematoma on the left upper jaw- monitor  Hypertension:BP stable.  Endorses leg swelling we will resume home dose of Lasix 40 mg.   Body mass index is 27.9 kg/m.   DVT prophylaxis:SCD Code Status:full Family Communication: plan of care discussed with patient at bedside. Disposition Plan: Patient is from:home Anticipated Disposition: to home Barriers to discharge or conditions that needs to be met prior to discharge: Patient admitted with severe thrombocytopenia, relapse of TTP remains in the hospital for ongoing management with plasma exchange x for 7 days, serial monitoring, will be in the hospital pending further stabilization and clearance by hematology  Consultants: hematology/oncology Procedures:see note Microbiology:see note  Medications: Scheduled Meds: . cephALEXin  500 mg Oral Q6H  . Chlorhexidine Gluconate Cloth  6 each Topical Daily  . folic acid  2 mg Oral Daily  . furosemide  20 mg Oral Daily  . insulin aspart  0-15 Units Subcutaneous TID WC  . [START  ON 05/22/2019] methylPREDNISolone (SOLU-MEDROL) injection  80 mg Intravenous Once  . nystatin cream   Topical BID  . [START ON 05/22/2019] rituximab  375 mg/m2 Intravenous Once  .  sitaGLIPtin  100 mg Oral Daily   Continuous Infusions: . [START ON 05/22/2019] famotidine (PEPCID) IV      Antimicrobials: Anti-infectives (From admission, onward)   Start     Dose/Rate Route Frequency Ordered Stop   05/16/19 1800  cephALEXin (KEFLEX) capsule 500 mg     500 mg Oral Every 6 hours 05/16/19 1636 05/21/19 1759       Objective: Vitals: Today's Vitals   05/20/19 1647 05/20/19 2022 05/21/19 0525 05/21/19 0832  BP: 118/65 138/71 110/63 117/61  Pulse: 92 92 84 88  Resp: _0 Temp: 99.7 F (37.6 C) 99 F (37.2 C) 98.4 F (36.9 C) 98.6 F (37 C)  TempSrc: Oral Oral Oral Oral  SpO2: 98% 100% 100% 98%  Weight:      Height:      PainSc:  0-No pain  0-No pain    Intake/Output Summary (Last 24 hours) at 05/21/2019 1124 Last data filed at 05/21/2019 0900 Gross per 24 hour  Intake 2000 ml  Output --  Net 2000 ml   Filed Weights   05/16/19 0937 05/20/19 0609  Weight: 67.1 kg 69.2 kg   Weight change:    Intake/Output from previous day: 03/15 0701 - 03/16 0700 In: 1700 [P.O.:700; IV Piggyback:1000] Out: -  Intake/Output this shift: Total I/O In: 300 [P.O.:300] Out: -   Examination:  General exam: AAO X3, not in acute distress, resting, HEENT:Oral mucosa moist, Ear/Nose WNL grossly, dentition normal. Respiratory system: bilaterally clear, no use of accessory muscle Cardiovascular system: S1 & S2 +, No JVD,. Gastrointestinal system: Abdomen soft, NT,ND, BS+ Nervous System:Alert, awake, moving extremities and grossly nonfocal Extremities: No edema, distal peripheral pulses palpable.  Skin: No rashes,no icterus. MSK: Normal muscle bulk,tone, power Bleeding stopped from the chest catheter site dressing intact  Data Reviewed: I have personally reviewed following labs and imaging studies CBC: Recent Labs  Lab 05/17/19 0320 05/18/19 0326 05/19/19 0444 05/20/19 0300 05/21/19 0409  WBC 1.8* 1.8* 2.0* 1.4* 1.4*  NEUTROABS 1.2* 1.1* 1.4* 0.8*  PENDING  HGB 10.0* 10.2* 10.0* 8.5* 9.1*  HCT 29.8* 30.2* 29.2* 25.5* 27.8*  MCV 91.1 92.4 91.3 92.7 94.6  PLT 11* 18* 38* 42* 58*   Basic Metabolic Panel: Recent Labs  Lab 05/18/19 0326 05/19/19 0444 05/20/19 0300 05/20/19 0839 05/21/19 0409  NA 136 137 139 138 140  K 3.8 3.3* 3.6 3.5 3.8  CL 100 101 102 100 104  CO2 _1 GLUCOSE 185* 156* 156* 228* 202*  BUN _2 CREATININE 0.84 0.80 0.84 0.81 0.98  CALCIUM 9.6 9.2 8.9 9.1 9.1   GFR: Estimated Creatinine Clearance: 56.3 mL/min (by C-G formula based on SCr of 0.98 mg/dL). Liver Function Tests: Recent Labs  Lab 05/17/19 0320 05/18/19 0326 05/19/19 0444 05/20/19 0300 05/21/19 0409  AST 107* 54* 51* 52* 50*  ALT 50* _3 ALKPHOS 78 57 52 54 59  BILITOT 1.6* 1.3* 0.9 0.9 0.7  PROT 7.9 6.3* 5.9* 5.3* 5.6*  ALBUMIN 3.4* 3.3* 3.2* 3.0* 3.1*   No results for input(s): LIPASE, AMYLASE in the last 168 hours. No results for input(s): AMMONIA in the last 168 hours. Coagulation Profile: No results for input(s): INR, PROTIME in the last 168 hours.  Cardiac Enzymes: No results for input(s): CKTOTAL, CKMB, CKMBINDEX, TROPONINI in the last 168 hours. BNP (last 3 results) No results for input(s): PROBNP in the last 8760 hours. HbA1C: No results for input(s): HGBA1C in the last 72 hours. CBG: Recent Labs  Lab 05/20/19 1351 05/20/19 1649 05/20/19 2347 05/21/19 0715 05/21/19 1122  GLUCAP 185* 170* 156* 124* 125*   Lipid Profile: No results for input(s): CHOL, HDL, LDLCALC, TRIG, CHOLHDL, LDLDIRECT in the last 72 hours. Thyroid Function Tests: No results for input(s): TSH, T4TOTAL, FREET4, T3FREE, THYROIDAB in the last 72 hours. Anemia Panel: Recent Labs    05/20/19 0300 05/21/19 0409  RETICCTPCT 1.6 1.5   Sepsis Labs: Recent Labs  Lab 05/16/19 1013 05/16/19 1545 05/17/19 0320 05/18/19 0326  PROCALCITON  --  0.76 0.68 0.52  LATICACIDVEN 1.1  --   --   --     Recent Results (from  the past 240 hour(s))  Culture, blood (routine x 2)     Status: None   Collection Time: 05/16/19 10:13 AM   Specimen: BLOOD  Result Value Ref Range Status   Specimen Description   Final    BLOOD RIGHT ANTECUBITAL Performed at [facility], [address] [place], [place] [address]    Special Requests   Final    BOTTLES DRAWN AEROBIC AND ANAEROBIC Blood Culture adequate volume Performed at [facility], [address] [place], [place] [address]    Culture   Final    NO GROWTH 5 DAYS Performed at [facility], [place] [address], [place], [place] [address]    Report Status 05/21/2019 FINAL  Final  Culture, blood (routine x 2)     Status: None   Collection Time: 05/16/19 10:23 AM   Specimen: BLOOD  Result Value Ref Range Status   Specimen Description   Final    BLOOD LEFT ANTECUBITAL Performed at [facility], [address] [place], [place] [address]    Special Requests   Final    BOTTLES DRAWN AEROBIC AND ANAEROBIC Blood Culture adequate volume Performed at [facility], [address] [place], [place] [address]    Culture   Final    NO GROWTH 5 DAYS Performed at [facility], [place] [address] [place], [place] [address]    Report Status 05/21/2019 FINAL  Final  Urine culture     Status: Abnormal   Collection Time: 05/16/19 10:24 AM   Specimen: Urine, Random  Result Value Ref Range Status   Specimen Description   Final    URINE, RANDOM Performed at [facility], [address] [place], [place] [address]    Special Requests   Final    NONE Performed at [facility], [address] [place], [place] [address]    Culture MULTIPLE SPECIES PRESENT, SUGGEST RECOLLECTION (A)  Final   Report Status 05/17/2019 FINAL  Final  Respiratory Panel by RT PCR (Flu A&B, Covid) - Nasopharyngeal Swab     Status: None   Collection Time: 05/16/19 12:01 PM   Specimen: Nasopharyngeal Swab  Result Value Ref Range  Status   SARS Coronavirus 2 by RT PCR NEGATIVE NEGATIVE Final    Comment: (NOTE) SARS-CoV-2 target nucleic acids are NOT DETECTED. The SARS-CoV-2 RNA is generally detectable in upper respiratoy specimens during the acute phase of infection. The lowest concentration of SARS-CoV-2 viral copies this assay can detect is 131  copies/mL. A negative result does not preclude SARS-Cov-2 infection and should not be used as the sole basis for treatment or other patient management decisions. A negative result may occur with  improper specimen collection/handling, submission of specimen other than nasopharyngeal swab, presence of viral mutation(s) within the areas targeted by this assay, and inadequate number of viral copies (<131 copies/mL). A negative result must be combined with clinical observations, patient history, and epidemiological information. The expected result is Negative. Fact Sheet for Patients:  PinkCheek.be Fact Sheet for Healthcare Providers:  GravelBags.it This test is not yet ap proved or cleared by the [place] FDA and  has been authorized for detection and/or diagnosis of SARS-CoV-2 by FDA under an Emergency Use Authorization (EUA). This EUA will remain  in effect (meaning this test can be used) for the duration of the COVID-19 declaration under Section 564(b)(1) of the Act, 21 U.S.C. section 360bbb-3(b)(1), unless the authorization is terminated or revoked sooner.    Influenza A by PCR NEGATIVE NEGATIVE Final   Influenza B by PCR NEGATIVE NEGATIVE Final    Comment: (NOTE) The Xpert Xpress SARS-CoV-2/FLU/RSV assay is intended as an aid in  the diagnosis of influenza from Nasopharyngeal swab specimens and  should not be used as a sole basis for treatment. Nasal washings and  aspirates are unacceptable for Xpert Xpress SARS-CoV-2/FLU/RSV  testing. Fact Sheet for  Patients: PinkCheek.be Fact Sheet for Healthcare Providers: GravelBags.it This test is not yet approved or cleared by the [place] FDA and  has been authorized for detection and/or diagnosis of SARS-CoV-2 by  FDA under an Emergency Use Authorization (EUA). This EUA will remain  in effect (meaning this test can be used) for the duration of the  Covid-19 declaration under Section 564(b)(1) of the Act, 21  U.S.C. section 360bbb-3(b)(1), unless the authorization is  terminated or revoked. Performed at [facility], [address], [place], [place] [address]       Radiology Studies: DG Chest 2 View  Result Date: 05/20/2019 CLINICAL DATA:  Wheezing EXAM: CHEST - 2 VIEW COMPARISON:  May 16, 2019 FINDINGS: Central catheter tip is at the superior vena cava-right atrium junction. No pneumothorax. Lungs are clear. Heart size and pulmonary vascularity are normal. No adenopathy. No bone lesions. IMPRESSION: Central catheter tip at cavoatrial junction. No pneumothorax. Lungs clear. Cardiac silhouette normal. Electronically Signed   By: Lowella Grip III M.D.   On: 05/20/2019 08:11     LOS: 5 days   Time spent: More than 50% of that time was spent in counseling and/or coordination of care.  Antonieta Pert, MD [facility]  05/21/2019, 11:24 AM

## 2019-05-21 NOTE — Plan of Care (Signed)

## 2019-05-21 NOTE — Progress Notes (Signed)
[name] was moved out of the stepdown unit yesterday.  She now is up on 5 M.  She is doing okay.  I do not see any bleeding at the dialysis catheter site.  She is doing well with her plasma exchanges.  Her platelet count today is 58,000.  Her LDH is down to 186.  Her white cell count is still 1.4.  I will still hold on any Neupogen.  Again she is afebrile.  It is obvious that if she is responding again to the plasma exchange.  We really need to get the Rituxan into her.  We will have this done tomorrow.  She has had no problems with diarrhea.  She has had no cough or shortness of breath.  She had her chest x-ray yesterday.  There is no evidence of fluid overload.  Her blood sugars were up a little bit.  The glucose was 202 this morning.  Her creatinine was 0.98.  Her bilirubin was 0.7.  She has had no fever.  Her temperature this morning is 98.4.  Her blood pressure is 110/63.  Her lungs sound better.  I do not hear any wheezing.  Cardiac exam regular rate and rhythm with no murmurs, rubs or bruits.  Abdomen is soft.  Bowel sounds are present.  Extremities shows no clubbing, cyanosis or edema.  Neurological exam is nonfocal.  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  We will order the Rituxan for tomorrow.  I think she should be able to get over it [facility].  She is already had her first dose in the clinic.  She had no problems with the first dose.  I know that she is getting outstanding care from all the staff on 40M.  I appreciate all the help from the dialysis unit.  Everybody is working together so that we can try to get Vicki [name] back into remission from her TTP.  Lattie Haw, MD  Philippians 3:8

## 2019-05-21 NOTE — Plan of Care (Signed)
?  Problem: Clinical Measurements: ?Goal: Will remain free from infection ?Outcome: Progressing ?  ?

## 2019-05-22 ENCOUNTER — Ambulatory Visit: Payer: Federal, State, Local not specified - PPO

## 2019-05-22 ENCOUNTER — Other Ambulatory Visit: Payer: Federal, State, Local not specified - PPO

## 2019-05-22 ENCOUNTER — Ambulatory Visit: Payer: Federal, State, Local not specified - PPO | Admitting: Hematology & Oncology

## 2019-05-22 LAB — BASIC METABOLIC PANEL
Anion gap: 8 (ref 5–15)
BUN: 7 mg/dL (ref 6–20)
CO2: 25 mmol/L (ref 22–32)
Calcium: 8.8 mg/dL — ABNORMAL LOW (ref 8.9–10.3)
Chloride: 105 mmol/L (ref 98–111)
Creatinine, Ser: 0.82 mg/dL (ref 0.44–1.00)
GFR calc Af Amer: >60 mL/min (ref >60)
GFR calc non Af Amer: >60 mL/min (ref >60)
Glucose, Bld: 257 mg/dL — ABNORMAL HIGH (ref 70–99)
Potassium: 3.6 mmol/L (ref 3.5–5.1)
Sodium: 138 mmol/L (ref 135–145)

## 2019-05-22 LAB — THERAPEUTIC PLASMA EXCHANGE (BLOOD BANK)
Plasma Exchange: 3000
Plasma volume needed: 3000
Unit division: 0
Unit division: 0
Unit division: 0
Unit division: 0
Unit division: 0
Unit division: 0
Unit division: 0
Unit division: 0
Unit division: 0
Unit division: 0

## 2019-05-22 LAB — GLUCOSE, CAPILLARY
Glucose-Capillary: 125 mg/dL — ABNORMAL HIGH (ref 70–99)
Glucose-Capillary: 151 mg/dL — ABNORMAL HIGH (ref 70–99)
Glucose-Capillary: 312 mg/dL — ABNORMAL HIGH (ref 70–99)

## 2019-05-22 LAB — CBC WITH DIFFERENTIAL/PLATELET
Abs Immature Granulocytes: 0 10*3/uL (ref 0.00–0.07)
Basophils Absolute: 0 10*3/uL (ref 0.0–0.1)
Basophils Relative: 1 %
Eosinophils Absolute: 0.2 10*3/uL (ref 0.0–0.5)
Eosinophils Relative: 14 %
HCT: 25.9 % — ABNORMAL LOW (ref 36.0–46.0)
Hemoglobin: 8.5 g/dL — ABNORMAL LOW (ref 12.0–15.0)
Immature Granulocytes: 0 %
Lymphocytes Relative: 32 %
Lymphs Abs: 0.5 10*3/uL — ABNORMAL LOW (ref 0.7–4.0)
MCH: 30.9 pg (ref 26.0–34.0)
MCHC: 32.8 g/dL (ref 30.0–36.0)
MCV: 94.2 fL (ref 80.0–100.0)
Monocytes Absolute: 0.2 10*3/uL (ref 0.1–1.0)
Monocytes Relative: 12 %
Neutro Abs: 0.6 10*3/uL — ABNORMAL LOW (ref 1.7–7.7)
Neutrophils Relative %: 41 %
Platelets: 71 10*3/uL — ABNORMAL LOW (ref 150–400)
RBC: 2.75 MIL/uL — ABNORMAL LOW (ref 3.87–5.11)
RDW: 12.7 % (ref 11.5–15.5)
WBC: 1.5 10*3/uL — ABNORMAL LOW (ref 4.0–10.5)
nRBC: 0 % (ref 0.0–0.2)

## 2019-05-22 MED ORDER — SODIUM CHLORIDE 0.9% FLUSH
10.0000 mL | Freq: Two times a day (BID) | INTRAVENOUS | Status: DC
  Administered 2019-05-22 – 2019-05-24 (×3): 10 mL

## 2019-05-22 MED ORDER — ANTICOAGULANT SODIUM CITRATE 4% (200MG/5ML) IV SOLN
5.0000 mL | Status: AC
  Filled 2019-05-22: qty 5

## 2019-05-22 MED ORDER — CALCIUM CARBONATE ANTACID 500 MG PO CHEW
CHEWABLE_TABLET | ORAL | Status: AC
  Filled 2019-05-22: qty 4

## 2019-05-22 MED ORDER — DIPHENHYDRAMINE HCL 25 MG PO CAPS
25.0000 mg | ORAL_CAPSULE | Freq: Four times a day (QID) | ORAL | Status: DC | PRN
  Filled 2019-05-22: qty 1

## 2019-05-22 MED ORDER — SODIUM CHLORIDE 0.9 % IV SOLN
2.0000 g | INTRAVENOUS | Status: AC
  Administered 2019-05-22: 2 g via INTRAVENOUS
  Filled 2019-05-22: qty 20

## 2019-05-22 MED ORDER — CALCIUM CARBONATE ANTACID 500 MG PO CHEW: 2.0000 | CHEWABLE_TABLET | ORAL | Status: AC

## 2019-05-22 MED ORDER — DIPHENHYDRAMINE HCL 25 MG PO CAPS
ORAL_CAPSULE | ORAL | Status: AC
  Administered 2019-05-22: 25 mg via ORAL
  Filled 2019-05-22: qty 1

## 2019-05-22 MED ORDER — SODIUM CHLORIDE 0.9% FLUSH: 10.0000 mL | INTRAVENOUS | Status: DC | PRN

## 2019-05-22 MED ORDER — ACETAMINOPHEN 325 MG PO TABS: 650.0000 mg | ORAL_TABLET | ORAL | Status: DC | PRN

## 2019-05-22 MED ORDER — ACD FORMULA A 0.73-2.45-2.2 GM/100ML VI SOLN
Status: AC
  Filled 2019-05-22: qty 500

## 2019-05-22 MED ORDER — ACD FORMULA A 0.73-2.45-2.2 GM/100ML VI SOLN
1000.0000 mL | Status: DC
  Filled 2019-05-22: qty 1000

## 2019-05-22 NOTE — Progress Notes (Signed)
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  Patient was a started on her first infusion of rituximab 3/10.  She felt fine but noted rash on left arm and fever next day on 3/11.  Patient was seen by PCP labs showed worsening thrombocytopenia and leukopenia Dr. Marin [name] from oncology was contacted by PCP and patient was admitted to [facility]. In [place] ED: Patient was febrile to 100.38F. BP's mildly elevated. Vitals otherwise stable. CBC with Plt 13, Hgb 10.2, WBC 1.6. CMP WNL. Lactate WNL. UA without evidence of infection. Blood cx drawn.  CXR without acute cardiopulmonary abnormality.  Patient was admitted seen by oncologist and was started on therapeutic plasma exchange 05/17/19 for 7 days.  Patient is starts to improve clinically, platelet has been uptrending every day.  Subjective: No acute events overnight.  Platelet coming up. LDH 369->227->206->194->186. Bleeding stopped from the chest catheter site dressing intact.  Assessment & Plan:  TTP with relapse: With severe thrombocytopenia. Hematology oncology on board and started on  plasma exchange 05/17/19.Continue daily plasma exchange plan for total 7 days.Platelet increasing, hb is stable, no signs of bleeding.  Continue to monitor CBC daily.  Her LDH is improved.  For second dose of rituximab today.  Recent Labs  Lab 05/18/19 0326 05/19/19 0444 05/20/19 0300 05/21/19 0409 05/22/19 0710  PLT 18* 38* 42* 58* 71*   Leukopenia/neutropenia in the setting of #1.  WBC 1500, ANC still low-600. 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 Continue neutropenic precaution.  Monitor closely, appreciate oncology input. Recent Labs  Lab 05/18/19 0326  05/19/19 0444 05/20/19 0300 05/21/19 0409 05/22/19 0710  WBC 1.8* 2.0* 1.4* 1.4* 1.5*   Hypokalemia:Repleted  Controlled type 2 diabetes mellitus without complication, hemoglobin A1c stable at 6.8.  Blood sugar overall stable.We will continue sliding scale insulin.  Continue home linagliptin. Recent Labs  Lab 05/21/19 0715 05/21/19 1122 05/21/19 2124 05/22/19 0720 05/22/19 1136  GLUCAP 124* 125* 262* 125* 151*   Liver steatosis and nodular contour cystic of some underlying cirrhotic changes- had [place] abd in 03/03/19 secondary to NASH, LFTs up, ast/alt elevated 107/50.  Monitor.  Cellulitis left arm in the setting of fever and leukopenia.  completyed Keflex x 5 days.  Seems to have improved.  ?  Fungal infection of the left foot, added nystatin cream.  Small area of mucosal hematoma on the left upper jaw- monitor  Hypertension:BP stable.  Endorses leg swelling we will resume home dose of Lasix 40 mg.   Body mass index is 27.9 kg/m.   DVT prophylaxis:SCD Code Status:full Family Communication: plan of care discussed with patient at bedside. Disposition Plan: Patient is from:home Anticipated Disposition: to home Barriers to discharge or conditions that needs to be met prior to discharge: Patient admitted with severe thrombocytopenia, relapse of TTP remains in the hospital for ongoing management with plasma exchange x for 7 days, serial monitoring, will be in the hospital pending further stabilization and clearance by hematology  Consultants: hematology/oncology Procedures:see note Microbiology:see note  Medications: Scheduled Meds: . calcium carbonate  2 tablet Oral Q3H  . Chlorhexidine Gluconate Cloth  6 each Topical Daily  . folic acid  2 mg Oral Daily  . furosemide  40 mg Oral Daily  . insulin aspart  0-15  Units Subcutaneous TID WC  . nystatin cream   Topical BID  . sitaGLIPtin  100 mg Oral Daily  . sodium chloride flush  10-40 mL Intracatheter Q12H   Continuous  Infusions: . anticoagulant sodium citrate    . calcium gluconate IVPB    . citrate dextrose      Antimicrobials: Anti-infectives (From admission, onward)   Start     Dose/Rate Route Frequency Ordered Stop   05/16/19 1800  cephALEXin (KEFLEX) capsule 500 mg     500 mg Oral Every 6 hours 05/16/19 1636 05/21/19 1759       Objective: Vitals: Today's Vitals   05/21/19 2239 05/22/19 0529 05/22/19 0931 05/22/19 1105  BP:  113/65 135/67   Pulse:  79 92   Resp:  16 18   Temp:  97.9 F (36.6 C) 98.5 F (36.9 C)   TempSrc:  Oral Oral   SpO2:  98% 100%   Weight:      Height:      PainSc: Asleep   0-No pain    Intake/Output Summary (Last 24 hours) at 05/22/2019 1320 Last data filed at 05/22/2019 1200 Gross per 24 hour  Intake 1060 ml  Output --  Net 1060 ml   Filed Weights   05/16/19 0937 05/20/19 0609  Weight: 67.1 kg 69.2 kg   Weight change:    Intake/Output from previous day: 03/16 0701 - 03/17 0700 In: 840 [P.O.:840] Out: -  Intake/Output this shift: Total I/O In: 820 [P.O.:820] Out: -   Examination:  General exam: Alert awake and at baseline, not in distress.  On room air.  Ambulating in the room. HEENT:Oral mucosa moist, Ear/Nose WNL grossly, dentition normal. Respiratory system: bilaterally clear, no use of accessory muscle Cardiovascular system: S1 & S2 +, No JVD,. Gastrointestinal system: Abdomen soft, NT,ND, BS+ Nervous System:Alert, awake, moving extremities and grossly nonfocal Extremities: No edema, distal peripheral pulses palpable.  Skin: No rashes,no icterus. MSK: Normal muscle bulk,tone, power Right chest with HD catheter present no bleeding.  Dressing intact  Data Reviewed: I have personally reviewed following labs and imaging studies CBC: Recent Labs  Lab 05/18/19 0326 05/19/19 0444 05/20/19 0300 05/21/19 0409 05/22/19 0710  WBC 1.8* 2.0* 1.4* 1.4* 1.5*  NEUTROABS 1.1* 1.4* 0.8* 0.6* 0.6*  HGB 10.2* 10.0* 8.5* 9.1* 8.5*  HCT 30.2*  29.2* 25.5* 27.8* 25.9*  MCV 92.4 91.3 92.7 94.6 94.2  PLT 18* 38* 42* 58* 71*   Basic Metabolic Panel: Recent Labs  Lab 05/19/19 0444 05/20/19 0300 05/20/19 0839 05/21/19 0409 05/21/19 1643  NA 137 139 138 140 140  K 3.3* 3.6 3.5 3.8 3.8  CL 101 102 100 104 105  CO2 _0 GLUCOSE 156* 156* 228* 202* 169*  BUN _1 CREATININE 0.80 0.84 0.81 0.98 0.82  CALCIUM 9.2 8.9 9.1 9.1 9.0   GFR: Estimated Creatinine Clearance: 67.3 mL/min (by C-G formula based on SCr of 0.82 mg/dL). Liver Function Tests: Recent Labs  Lab 05/17/19 0320 05/18/19 0326 05/19/19 0444 05/20/19 0300 05/21/19 0409  AST 107* 54* 51* 52* 50*  ALT 50* _2 ALKPHOS 78 57 52 54 59  BILITOT 1.6* 1.3* 0.9 0.9 0.7  PROT 7.9 6.3* 5.9* 5.3* 5.6*  ALBUMIN 3.4* 3.3* 3.2* 3.0* 3.1*   No results for input(s): LIPASE, AMYLASE in the last 168 hours. No results for input(s): AMMONIA in the last 168 hours. Coagulation Profile: No results for input(s): INR,  PROTIME in the last 168 hours. Cardiac Enzymes: No results for input(s): CKTOTAL, CKMB, CKMBINDEX, TROPONINI in the last 168 hours. BNP (last 3 results) No results for input(s): PROBNP in the last 8760 hours. HbA1C: No results for input(s): HGBA1C in the last 72 hours. CBG: Recent Labs  Lab 05/21/19 0715 05/21/19 1122 05/21/19 2124 05/22/19 0720 05/22/19 1136  GLUCAP 124* 125* 262* 125* 151*   Lipid Profile: No results for input(s): CHOL, HDL, LDLCALC, TRIG, CHOLHDL, LDLDIRECT in the last 72 hours. Thyroid Function Tests: No results for input(s): TSH, T4TOTAL, FREET4, T3FREE, THYROIDAB in the last 72 hours. Anemia Panel: Recent Labs    05/20/19 0300 05/21/19 0409  RETICCTPCT 1.6 1.5   Sepsis Labs: Recent Labs  Lab 05/16/19 1013 05/16/19 1545 05/17/19 0320 05/18/19 0326  PROCALCITON  --  0.76 0.68 0.52  LATICACIDVEN 1.1  --   --   --     Recent Results (from the past 240 hour(s))  Culture, blood (routine x 2)      Status: None   Collection Time: 05/16/19 10:13 AM   Specimen: BLOOD  Result Value Ref Range Status   Specimen Description   Final    BLOOD RIGHT ANTECUBITAL Performed at [facility], [address] [place], [place] [address]    Special Requests   Final    BOTTLES DRAWN AEROBIC AND ANAEROBIC Blood Culture adequate volume Performed at [facility], [address] [place], [place] [address]    Culture   Final    NO GROWTH 5 DAYS Performed at [facility], [place] [address] [place], [place] [address]    Report Status 05/21/2019 FINAL  Final  Culture, blood (routine x 2)     Status: None   Collection Time: 05/16/19 10:23 AM   Specimen: BLOOD  Result Value Ref Range Status   Specimen Description   Final    BLOOD LEFT ANTECUBITAL Performed at [facility], [address] [place], [place] [address]    Special Requests   Final    BOTTLES DRAWN AEROBIC AND ANAEROBIC Blood Culture adequate volume Performed at [facility], [address] [place], [place] [address]    Culture   Final    NO GROWTH 5 DAYS Performed at [facility], [place] [address] [place], [place] [address]    Report Status 05/21/2019 FINAL  Final  Urine culture     Status: Abnormal   Collection Time: 05/16/19 10:24 AM   Specimen: Urine, Random  Result Value Ref Range Status   Specimen Description   Final    URINE, RANDOM Performed at [facility], [address] [place], [place] [address]    Special Requests   Final    NONE Performed at [facility], [address] [place], [place] [address]    Culture MULTIPLE SPECIES PRESENT, SUGGEST RECOLLECTION (A)  Final   Report Status 05/17/2019 FINAL  Final  Respiratory Panel by RT PCR (Flu A&B, Covid) - Nasopharyngeal Swab     Status: None   Collection Time: 05/16/19 12:01 PM   Specimen: Nasopharyngeal Swab  Result Value Ref Range Status   SARS Coronavirus 2 by RT PCR NEGATIVE NEGATIVE  Final    Comment: (NOTE) SARS-CoV-2 target nucleic acids are NOT DETECTED. The SARS-CoV-2 RNA is generally detectable in upper respiratoy specimens during the acute phase of infection. The lowest concentration of SARS-CoV-2 viral copies  this assay can detect is 131 copies/mL. A negative result does not preclude SARS-Cov-2 infection and should not be used as the sole basis for treatment or other patient management decisions. A negative result may occur with  improper specimen collection/handling, submission of specimen other than nasopharyngeal swab, presence of viral mutation(s) within the areas targeted by this assay, and inadequate number of viral copies (<131 copies/mL). A negative result must be combined with clinical observations, patient history, and epidemiological information. The expected result is Negative. Fact Sheet for Patients:  PinkCheek.be Fact Sheet for Healthcare Providers:  GravelBags.it This test is not yet ap proved or cleared by the [place] FDA and  has been authorized for detection and/or diagnosis of SARS-CoV-2 by FDA under an Emergency Use Authorization (EUA). This EUA will remain  in effect (meaning this test can be used) for the duration of the COVID-19 declaration under Section 564(b)(1) of the Act, 21 U.S.C. section 360bbb-3(b)(1), unless the authorization is terminated or revoked sooner.    Influenza A by PCR NEGATIVE NEGATIVE Final   Influenza B by PCR NEGATIVE NEGATIVE Final    Comment: (NOTE) The Xpert Xpress SARS-CoV-2/FLU/RSV assay is intended as an aid in  the diagnosis of influenza from Nasopharyngeal swab specimens and  should not be used as a sole basis for treatment. Nasal washings and  aspirates are unacceptable for Xpert Xpress SARS-CoV-2/FLU/RSV  testing. Fact Sheet for Patients: PinkCheek.be Fact Sheet for Healthcare  Providers: GravelBags.it This test is not yet approved or cleared by the [place] FDA and  has been authorized for detection and/or diagnosis of SARS-CoV-2 by  FDA under an Emergency Use Authorization (EUA). This EUA will remain  in effect (meaning this test can be used) for the duration of the  Covid-19 declaration under Section 564(b)(1) of the Act, 21  U.S.C. section 360bbb-3(b)(1), unless the authorization is  terminated or revoked. Performed at [facility], [place], [address] [place], [place] [address]       Radiology Studies: No results found.   LOS: 6 days   Time spent: More than 50% of that time was spent in counseling and/or coordination of care.  Antonieta Pert, MD [facility]  05/22/2019, 1:20 PM

## 2019-05-22 NOTE — Progress Notes (Addendum)
[name] is doing okay.  She did have a reaction during the plasma exchange.  She had some urticaria.  She was given some Benadryl.  She has responded nicely to the plasma exchange.  Her platelet count is 58,000 on Tuesday.  Her LDH yesterday was 186.  Today's labs are still pending.  She should get the Rituxan today in addition to the plasma exchange.  She says of the lesions down in her inguinal area are improved.  She is on antibiotics for this.  A urine culture showed multiple species.  She has had no cough or shortness of breath.  She has had no fever.  She has had no diarrhea.  Her appetite is doing okay.  Her blood sugars are on the high side.  Her vital signs all look stable.  Her temperature is 97.9.  Pulse 79.  Blood pressure 113/65.  Her lungs are clear bilaterally.  Cardiac exam regular rate and rhythm with no murmurs, rubs or bruits.  Abdomen is soft.  Bowel sounds are present.  She has no obvious hepatosplenomegaly.  Extremities shows no clubbing, cyanosis or edema.  Neurological exam shows no focal neurological deficits.  [name] has a relapsed TTP.  She is on plasma exchange.  She is doing well.  Her platelet count is responding.  We will see what today's level is.  She should get her Rituxan today.  Maybe, if we still see a good response by the end of the week, we will be able to do her plasma changes in outpatient.  She will need to do every other day plasma exchange and gradually taper down.  It is apparent that she is getting fantastic care from all the staff on [facility].  I appreciate all of their help in their compassion.  Lattie Haw, MD  Job 23:10  ADDENDUM: I  need to clarify that I spoke to [name] this morning about the Rituxan.  I know that she has had her first dose Rituxan in the office.  I am wanted to talk to her again about this.  I explained to her the benefit of Rituxan is to try to decrease the production of the TTP antibody.  I went over the  risks.  She had no problems with the first dose of Rituxan so I would not think that she should have a problem with the second dose.  She agrees to take the Rituxan.  We can certainly give it before or after her plasma exchange.   Lattie Haw, MD  [address] [place] 3:15

## 2019-05-22 NOTE — Plan of Care (Signed)
  Problem: Education: Goal: Knowledge of General Education information will improve Description Including pain rating scale, medication(s)/side effects and non-pharmacologic comfort measures Outcome: Progressing   

## 2019-05-23 ENCOUNTER — Inpatient Hospital Stay (HOSPITAL_COMMUNITY): Payer: Federal, State, Local not specified - PPO

## 2019-05-23 ENCOUNTER — Telehealth: Payer: Self-pay | Admitting: *Deleted

## 2019-05-23 ENCOUNTER — Inpatient Hospital Stay: Payer: Federal, State, Local not specified - PPO

## 2019-05-23 ENCOUNTER — Inpatient Hospital Stay: Payer: Federal, State, Local not specified - PPO | Admitting: Hematology & Oncology

## 2019-05-23 ENCOUNTER — Encounter: Payer: Self-pay | Admitting: *Deleted

## 2019-05-23 ENCOUNTER — Encounter (HOSPITAL_COMMUNITY): Payer: Self-pay | Admitting: Internal Medicine

## 2019-05-23 HISTORY — PX: IR FLUORO GUIDE CV LINE RIGHT: IMG2283

## 2019-05-23 LAB — BASIC METABOLIC PANEL
Anion gap: 9 (ref 5–15)
Anion gap: 11 (ref 5–15)
BUN: 10 mg/dL (ref 6–20)
BUN: 10 mg/dL (ref 6–20)
CO2: 24 mmol/L (ref 22–32)
CO2: 24 mmol/L (ref 22–32)
Calcium: 9.5 mg/dL (ref 8.9–10.3)
Calcium: 9.3 mg/dL (ref 8.9–10.3)
Chloride: 106 mmol/L (ref 98–111)
Chloride: 105 mmol/L (ref 98–111)
Creatinine, Ser: 0.73 mg/dL (ref 0.44–1.00)
Creatinine, Ser: 0.69 mg/dL (ref 0.44–1.00)
GFR calc Af Amer: >60 mL/min (ref >60)
GFR calc Af Amer: >60 mL/min (ref >60)
GFR calc non Af Amer: >60 mL/min (ref >60)
GFR calc non Af Amer: >60 mL/min (ref >60)
Glucose, Bld: 204 mg/dL — ABNORMAL HIGH (ref 70–99)
Glucose, Bld: 178 mg/dL — ABNORMAL HIGH (ref 70–99)
Potassium: 3.2 mmol/L — ABNORMAL LOW (ref 3.5–5.1)
Potassium: 3.1 mmol/L — ABNORMAL LOW (ref 3.5–5.1)
Sodium: 139 mmol/L (ref 135–145)
Sodium: 140 mmol/L (ref 135–145)

## 2019-05-23 LAB — COMPREHENSIVE METABOLIC PANEL
ALT: 22 U/L (ref 0–44)
AST: 30 U/L (ref 15–41)
Albumin: 3.1 g/dL — ABNORMAL LOW (ref 3.5–5.0)
Alkaline Phosphatase: 64 U/L (ref 38–126)
Anion gap: 9 (ref 5–15)
BUN: 10 mg/dL (ref 6–20)
CO2: 26 mmol/L (ref 22–32)
Calcium: 9.1 mg/dL (ref 8.9–10.3)
Chloride: 104 mmol/L (ref 98–111)
Creatinine, Ser: 0.76 mg/dL (ref 0.44–1.00)
GFR calc Af Amer: >60 mL/min (ref >60)
GFR calc non Af Amer: >60 mL/min (ref >60)
Glucose, Bld: 233 mg/dL — ABNORMAL HIGH (ref 70–99)
Potassium: 3.5 mmol/L (ref 3.5–5.1)
Sodium: 139 mmol/L (ref 135–145)
Total Bilirubin: 0.6 mg/dL (ref 0.3–1.2)
Total Protein: 5.6 g/dL — ABNORMAL LOW (ref 6.5–8.1)

## 2019-05-23 LAB — THERAPEUTIC PLASMA EXCHANGE (BLOOD BANK)
Plasma Exchange: 3000
Plasma volume needed: 3000
Unit division: 0
Unit division: 0
Unit division: 0
Unit division: 0
Unit division: 0
Unit division: 0
Unit division: 0
Unit division: 0
Unit division: 0
Unit division: 0

## 2019-05-23 LAB — CBC WITH DIFFERENTIAL/PLATELET
Abs Immature Granulocytes: 0.01 10*3/uL (ref 0.00–0.07)
Basophils Absolute: 0 10*3/uL (ref 0.0–0.1)
Basophils Relative: 0 %
Eosinophils Absolute: 0 10*3/uL (ref 0.0–0.5)
Eosinophils Relative: 0 %
HCT: 27 % — ABNORMAL LOW (ref 36.0–46.0)
Hemoglobin: 9.1 g/dL — ABNORMAL LOW (ref 12.0–15.0)
Immature Granulocytes: 0 %
Lymphocytes Relative: 8 %
Lymphs Abs: 0.3 10*3/uL — ABNORMAL LOW (ref 0.7–4.0)
MCH: 31.1 pg (ref 26.0–34.0)
MCHC: 33.7 g/dL (ref 30.0–36.0)
MCV: 92.2 fL (ref 80.0–100.0)
Monocytes Absolute: 0.2 10*3/uL (ref 0.1–1.0)
Monocytes Relative: 6 %
Neutro Abs: 2.7 10*3/uL (ref 1.7–7.7)
Neutrophils Relative %: 86 %
Platelets: 91 10*3/uL — ABNORMAL LOW (ref 150–400)
RBC: 2.93 MIL/uL — ABNORMAL LOW (ref 3.87–5.11)
RDW: 12.3 % (ref 11.5–15.5)
WBC: 3.2 10*3/uL — ABNORMAL LOW (ref 4.0–10.5)
nRBC: 0 % (ref 0.0–0.2)

## 2019-05-23 LAB — PATHOLOGIST SMEAR REVIEW

## 2019-05-23 LAB — GLUCOSE, CAPILLARY
Glucose-Capillary: 152 mg/dL — ABNORMAL HIGH (ref 70–99)
Glucose-Capillary: 155 mg/dL — ABNORMAL HIGH (ref 70–99)
Glucose-Capillary: 227 mg/dL — ABNORMAL HIGH (ref 70–99)

## 2019-05-23 LAB — LACTATE DEHYDROGENASE: LDH: 176 U/L (ref 98–192)

## 2019-05-23 MED ORDER — ACD FORMULA A 0.73-2.45-2.2 GM/100ML VI SOLN
1000.0000 mL | Status: DC
  Administered 2019-05-23: 1000 mL
  Filled 2019-05-23: qty 1000

## 2019-05-23 MED ORDER — ACD FORMULA A 0.73-2.45-2.2 GM/100ML VI SOLN
Status: AC
  Filled 2019-05-23: qty 500

## 2019-05-23 MED ORDER — MOMETASONE FUROATE 0.1 % EX CREA
TOPICAL_CREAM | Freq: Every day | CUTANEOUS | Status: DC
  Filled 2019-05-23: qty 15

## 2019-05-23 MED ORDER — HEPARIN SODIUM (PORCINE) 1000 UNIT/ML IJ SOLN
INTRAMUSCULAR | Status: AC
  Administered 2019-05-23: 3200 [IU]
  Filled 2019-05-23: qty 1

## 2019-05-23 MED ORDER — CEFAZOLIN SODIUM-DEXTROSE 1-4 GM/50ML-% IV SOLN
INTRAVENOUS | Status: AC | PRN
  Administered 2019-05-23: 2 g via INTRAVENOUS

## 2019-05-23 MED ORDER — LIDOCAINE-EPINEPHRINE 1 %-1:100000 IJ SOLN
INTRAMUSCULAR | Status: AC
  Filled 2019-05-23: qty 1

## 2019-05-23 MED ORDER — DARBEPOETIN ALFA 100 MCG/0.5ML IJ SOSY
PREFILLED_SYRINGE | INTRAMUSCULAR | Status: AC
  Filled 2019-05-23: qty 0.5

## 2019-05-23 MED ORDER — FENTANYL CITRATE (PF) 100 MCG/2ML IJ SOLN
INTRAMUSCULAR | Status: AC | PRN
  Administered 2019-05-23: 50 ug via INTRAVENOUS

## 2019-05-23 MED ORDER — ACETAMINOPHEN 325 MG PO TABS
650.0000 mg | ORAL_TABLET | ORAL | Status: DC | PRN
  Administered 2019-05-23: 650 mg via ORAL

## 2019-05-23 MED ORDER — CALCIUM CARBONATE ANTACID 500 MG PO CHEW
2.0000 | CHEWABLE_TABLET | ORAL | Status: AC
  Administered 2019-05-23 (×2): 400 mg via ORAL
  Filled 2019-05-23: qty 2

## 2019-05-23 MED ORDER — DIPHENHYDRAMINE HCL 25 MG PO CAPS
25.0000 mg | ORAL_CAPSULE | Freq: Four times a day (QID) | ORAL | Status: DC | PRN
  Administered 2019-05-23: 25 mg via ORAL

## 2019-05-23 MED ORDER — ANTICOAGULANT SODIUM CITRATE 4% (200MG/5ML) IV SOLN
5.0000 mL | Freq: Once | Status: AC
  Administered 2019-05-23: 5 mL
  Filled 2019-05-23: qty 5

## 2019-05-23 MED ORDER — CHLORHEXIDINE GLUCONATE 4 % EX LIQD
CUTANEOUS | Status: AC
  Administered 2019-05-23: 1
  Filled 2019-05-23: qty 15

## 2019-05-23 MED ORDER — SODIUM CHLORIDE 0.9 % IV SOLN
2.0000 g | Freq: Once | INTRAVENOUS | Status: AC
  Administered 2019-05-23: 2 g via INTRAVENOUS
  Filled 2019-05-23: qty 20

## 2019-05-23 MED ORDER — LIDOCAINE-EPINEPHRINE (PF) 1 %-1:200000 IJ SOLN
INTRAMUSCULAR | Status: AC | PRN
  Administered 2019-05-23: 10 mL

## 2019-05-23 MED ORDER — CEFAZOLIN SODIUM-DEXTROSE 2-4 GM/100ML-% IV SOLN
INTRAVENOUS | Status: AC
  Filled 2019-05-23: qty 100

## 2019-05-23 MED ORDER — FENTANYL CITRATE (PF) 100 MCG/2ML IJ SOLN
INTRAMUSCULAR | Status: AC
  Filled 2019-05-23: qty 2

## 2019-05-23 NOTE — H&P (Signed)
Chief Complaint: TTP  Referring Physician(s): Antonieta Pert  Supervising Physician: Sandi Mariscal  Patient Status: [facility] - In-pt  History of Present Illness: Vicki [name] is a 59 y.o. female with severe thrombocytopenia who was started on plasma exchange on 05/17/19.  Her platelets were as low as 13.   She had her temporary catheter placed by Dr. Annamaria Boots on 3/12.  She is going to need to continue outpatient plasma pheresis and we are asked to place a tunneled HD catheter.  She ate breakfast at 8 am.   Past Medical History:  Diagnosis Date  . Allergy   . Arthritis   . Boil   . 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 Diabetes ([place])    type 2  . Elevated liver enzymes   . Fatty liver   . Hyperlipemia   . Hypertension   . 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 PVC's (premature ventricular contractions)   . 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 COLONOSCOPY  2014  . ECTOPIC PREGNANCY SURGERY  1990  . IR FLUORO GUIDE CV LINE RIGHT  03/05/2019  . IR FLUORO GUIDE CV LINE RIGHT  03/15/2019  . IR FLUORO GUIDE CV LINE RIGHT  05/17/2019  . IR REMOVAL TUN CV CATH W/O FL  04/12/2019  . IR US GUIDE VASC ACCESS RIGHT  03/05/2019  . IR US GUIDE VASC ACCESS RIGHT  03/15/2019  . IR US GUIDE VASC ACCESS RIGHT  05/17/2019  . TONSILLECTOMY  1980  . UPPER GI ENDOSCOPY  08/2017   [facility]    Allergies: Metformin and related, Sulfa antibiotics, and Sulfasalazine  Medications: Prior to Admission medications   Medication Sig Start Date End Date Taking? Authorizing Provider  Alum Hydroxide-Mag Carbonate (GAVISCON EXTRA STRENGTH PO) Take 1 tablet by mouth 4 (four) times daily as needed. 03/26/19  Yes [provider]  calcium carbonate (TUMS - DOSED IN MG ELEMENTAL CALCIUM) 500 MG chewable tablet Chew 2 tablets  (400 mg of elemental calcium total) by mouth every 3 (three) hours. Patient taking differently: Chew 2 tablets by mouth daily as needed for indigestion.  03/15/19  Yes Swayze, Ava, DO  cholecalciferol (VITAMIN D3) 25 MCG (1000 UT) tablet Take 1,000 Units by mouth daily.   Yes [provider]  dextromethorphan-guaiFENesin (MUCINEX DM) 30-600 MG 12hr tablet Take 2 tablets by mouth 2 (two) times daily as needed for cough.   Yes [provider]  diphenhydrAMINE (BENADRYL) 50 MG capsule Take 1 capsule (50 mg total) by mouth daily as needed (on call for plasma exchange). 03/15/19  Yes Swayze, Ava, DO  Fexofenadine-Pseudoephedrine (ALLEGRA-D 24 HOUR PO) Take by mouth.   Yes [provider]  folic acid (FOLVITE) 1 MG tablet Take 2 tablets (2 mg total) by mouth daily. 04/09/19  Yes Volanda Napoleon, MD  furosemide (LASIX) 40 MG tablet Take 1 tablet (40 mg total) by mouth daily. 03/27/19  Yes Volanda Napoleon, MD  glucose blood test strip One touch ultra 11/11/15  Yes [provider]  Multiple Vitamins-Minerals (MULTI FOR HER 50+) TABS Take 1 tablet by mouth daily.   Yes [provider]  potassium chloride (KLOR-CON) 10 MEQ tablet Take 1 tablet (10 mEq total) by mouth daily. 03/27/19  Yes Volanda Napoleon, MD  senna-docusate (SENOKOT-S) 8.6-50 MG tablet Take 2  tablets by mouth 2 (two) times daily. 03/15/19  Yes Swayze, Ava, DO  sitaGLIPtin (JANUVIA) 100 MG tablet Take 1 tablet (100 mg total) by mouth daily. 04/29/19  Yes Lesleigh Noe, MD  lidocaine-prilocaine (EMLA) cream Apply to affected area once Patient not taking: Reported on 05/16/2019 05/15/19   Volanda Napoleon, MD  traMADol (ULTRAM) 50 MG tablet Take 1 tablet (50 mg total) by mouth every 6 (six) hours as needed. Patient not taking: Reported on 05/16/2019 03/15/19 03/14/20  Swayze, Radene Gunning, DO     Family History  Problem Relation Age of Onset  . Hypertension Mother   . Diabetes Mother   . Heart attack Mother 56  . Asthma  Mother        Childhood  . Arthritis Mother   . Heart disease Mother   . Hyperlipidemia Mother   . Hypertension Sister   . Diabetes Sister   . Asthma Sister 40       asthma attack cause of death  . Diabetes Brother   . Hypertension Brother   . Diabetes Brother   . Hypertension Brother   . Hypertension Maternal Grandmother   . Heart attack Maternal Grandfather 60  . Colon cancer Neg Hx     Social History   Socioeconomic History  . Marital status: Single    Spouse name: Not on file  . Number of children: 1  . Years of education: Not on file  . Highest education level: Some college, no degree  Occupational History    Employer: [employer]  Tobacco Use  . Smoking status: Never Smoker  . Smokeless tobacco: Never Used  Substance and Sexual Activity  . Alcohol use: Yes    Alcohol/week: 3.0 standard drinks    Types: 3 Standard drinks or equivalent per week    Comment: trying to reduce  . Drug use: No  . Sexual activity: Not Currently    Birth control/protection: Condom  Other Topics Concern  . 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 Difficulty of Paying Living Expenses:   Food Insecurity:   . Worried About Charity fundraiser in the Last Year:   . Arboriculturist in the Last Year:   Transportation Needs:   . Film/video editor (Medical):   [name] Lack of Transportation (Non-Medical):   Physical Activity:   . Days of Exercise per Week:   . Minutes of Exercise per Session:   Stress:   . Feeling of Stress :   Social Connections:    . Frequency of Communication with Friends and Family:   . Frequency of Social Gatherings with Friends and Family:   . Attends Religious Services:   . Active Member of Clubs or Organizations:   . Attends Archivist Meetings:   [name] Marital Status:      Review  of Systems: A 12 point ROS discussed and pertinent positives are indicated in the HPI above.  All other systems are negative.  Review of Systems  Vital Signs: BP (!) 148/84   Pulse 94   Temp 98.3 F (36.8 C) (Oral)   Resp 18   Ht 5' 2"  (1.575 m)   Wt 69.2 kg   LMP 04/30/2012   SpO2 100%   BMI 27.90 kg/m   Physical Exam Vitals reviewed.  Constitutional:      Appearance: Normal appearance.  HENT:     Head: Normocephalic and atraumatic.  Eyes:     Extraocular Movements: Extraocular movements intact.  Cardiovascular:     Rate and Rhythm: Normal rate and regular rhythm.  Pulmonary:     Effort: Pulmonary effort is normal. No respiratory distress.     Breath sounds: Normal breath sounds.  Abdominal:     General: There is no distension.     Palpations: Abdomen is soft.     Tenderness: There is no abdominal tenderness.  Musculoskeletal:        General: Normal range of motion.  Skin:    General: Skin is warm and dry.  Neurological:     General: No focal deficit present.     Mental Status: She is alert and oriented to person, place, and time.  Psychiatric:        Mood and Affect: Mood normal.        Behavior: Behavior normal.        Thought Content: Thought content normal.        Judgment: Judgment normal.     Imaging: DG Chest 2 View  Result Date: 05/20/2019 CLINICAL DATA:  Wheezing EXAM: CHEST - 2 VIEW COMPARISON:  May 16, 2019 FINDINGS: Central catheter tip is at the superior vena cava-right atrium junction. No pneumothorax. Lungs are clear. Heart size and pulmonary vascularity are normal. No adenopathy. No bone lesions. IMPRESSION: Central catheter tip at cavoatrial junction. No pneumothorax. Lungs  clear. Cardiac silhouette normal. Electronically Signed   By: Lowella Grip III M.D.   On: 05/20/2019 08:11   IR Fluoro Guide CV Line Right  Result Date: 05/17/2019 INDICATION: Recurrent TTP, access for plasmapheresis EXAM: ULTRASOUND GUIDANCE FOR VASCULAR ACCESS RIGHT IJ TEMPORARY MAHURKAR CATHETER FOR PLASMAPHERESIS MEDICATIONS: 1% LIDOCAINE LOCAL ANESTHESIA/SEDATION: Moderate Sedation Time: None. The patient's level of consciousness and vital signs were monitored continuously by radiology nursing throughout the procedure under my direct supervision. FLUOROSCOPY TIME:  Fluoroscopy Time: 0 minutes 42 seconds (1.0 mGy). COMPLICATIONS: None immediate. PROCEDURE: Informed written consent was obtained from the patient after a thorough discussion of the procedural risks, benefits and alternatives. All questions were addressed. Maximal Sterile Barrier Technique was utilized including caps, mask, sterile gowns, sterile gloves, sterile drape, hand hygiene and skin antiseptic. A timeout was performed prior to the initiation of the procedure. Under sterile conditions and local anesthesia, ultrasound micropuncture access performed of the patent right internal jugular vein. Images obtained for documentation of the patent right internal jugular vein. 018 guidewire advanced followed by the micro dilator. Amplatz guidewire exchange performed. Tract dilatation performed to insert a 16 cm Mahurkar temporary catheter for plasmapheresis. Tip position at the SVC RA junction. Images obtained for documentation. Catheter secured with Prolene sutures and a sterile dressing. Blood aspirated easily from all lumens followed by saline and heparin flushes. External caps applied. No immediate complication. Patient tolerated the procedure well. IMPRESSION: Successful ultrasound and fluoroscopic right IJ temporary mahurkar catheter for plasmapheresis. Ready for  use. Electronically Signed   By: Jerilynn Mages.  Shick M.D.   On: 05/17/2019 09:17   IR  US Guide Vasc Access Right  Result Date: 05/17/2019 INDICATION: Recurrent TTP, access for plasmapheresis EXAM: ULTRASOUND GUIDANCE FOR VASCULAR ACCESS RIGHT IJ TEMPORARY MAHURKAR CATHETER FOR PLASMAPHERESIS MEDICATIONS: 1% LIDOCAINE LOCAL ANESTHESIA/SEDATION: Moderate Sedation Time: None. The patient's level of consciousness and vital signs were monitored continuously by radiology nursing throughout the procedure under my direct supervision. FLUOROSCOPY TIME:  Fluoroscopy Time: 0 minutes 42 seconds (1.0 mGy). COMPLICATIONS: None immediate. PROCEDURE: Informed written consent was obtained from the patient after a thorough discussion of the procedural risks, benefits and alternatives. All questions were addressed. Maximal Sterile Barrier Technique was utilized including caps, mask, sterile gowns, sterile gloves, sterile drape, hand hygiene and skin antiseptic. A timeout was performed prior to the initiation of the procedure. Under sterile conditions and local anesthesia, ultrasound micropuncture access performed of the patent right internal jugular vein. Images obtained for documentation of the patent right internal jugular vein. 018 guidewire advanced followed by the micro dilator. Amplatz guidewire exchange performed. Tract dilatation performed to insert a 16 cm Mahurkar temporary catheter for plasmapheresis. Tip position at the SVC RA junction. Images obtained for documentation. Catheter secured with Prolene sutures and a sterile dressing. Blood aspirated easily from all lumens followed by saline and heparin flushes. External caps applied. No immediate complication. Patient tolerated the procedure well. IMPRESSION: Successful ultrasound and fluoroscopic right IJ temporary mahurkar catheter for plasmapheresis. Ready for use. Electronically Signed   By: Jerilynn Mages.  Shick M.D.   On: 05/17/2019 09:17   DG Chest Port 1 View  Result Date: 05/16/2019 CLINICAL DATA:  Fever, chemo infusion yesterday EXAM: PORTABLE CHEST 1  VIEW COMPARISON:  03/18/2019 FINDINGS: Cardiomediastinal contours and hilar structures are normal. Lungs are clear. No signs of pleural effusion. Visualized skeletal structures are unremarkable. IMPRESSION: No active disease. Electronically Signed   By: Zetta Bills M.D.   On: 05/16/2019 10:04    Labs:  CBC: Recent Labs    05/20/19 0300 05/21/19 0409 05/22/19 0710 05/23/19 0318  WBC 1.4* 1.4* 1.5* 3.2*  HGB 8.5* 9.1* 8.5* 9.1*  HCT 25.5* 27.8* 25.9* 27.0*  PLT 42* 58* 71* 91*    COAGS: Recent Labs    11/29/18 1005 02/13/19 1418 03/03/19 2300 03/15/19 0500  INR 1.2 1.2 1.3* 1.1    BMP: Recent Labs    05/21/19 1643 05/22/19 1350 05/23/19 0318 05/23/19 1015  NA 140 138 139 139  K 3.8 3.6 3.5 3.2*  CL 105 105 104 106  CO2 27 25 26 24   GLUCOSE 169* 257* 233* 204*  BUN 10 7 10 10   CALCIUM 9.0 8.8* 9.1 9.5  CREATININE 0.82 0.82 0.76 0.73  GFRNONAA >60 >60 >60 >60  GFRAA >60 >60 >60 >60    LIVER FUNCTION TESTS: Recent Labs    05/19/19 0444 05/20/19 0300 05/21/19 0409 05/23/19 0318  BILITOT 0.9 0.9 0.7 0.6  AST 51* 52* 50* 30  ALT 27 29 30 22   ALKPHOS 52 54 59 64  PROT 5.9* 5.3* 5.6* 5.6*  ALBUMIN 3.2* 3.0* 3.1* 3.1*    TUMOR MARKERS: No results for input(s): AFPTM, CEA, CA199, CHROMGRNA in the last 8760 hours.  Assessment and Plan:  TTP with recent severe thrombocytopenia with platelet count as low as 13.  Responding well to plasma exchange.   Will proceed with tunneled catheter today if IR schedule allows. Kept NPO.  Risks and benefits discussed with the patient including, but  not limited to bleeding, infection, vascular injury, pneumothorax which may require chest tube placement, air embolism or even death  All of the patient's questions were answered, patient is agreeable to proceed. Consent signed and in chart.  Thank you for this interesting consult.  I greatly enjoyed meeting Kinzie Wickes and look forward to participating in their care.   A copy of this report was sent to the requesting provider on this date.  Electronically Signed: Murrell Redden, PA-C   05/23/2019, 12:26 PM      I spent a total of    15 Minutes in face to face in clinical consultation, greater than 50% of which was counseling/coordinating care for tunneled HD catheter.

## 2019-05-23 NOTE — Progress Notes (Signed)
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  Patient was a started on her first infusion of rituximab 3/10.  She felt fine but noted rash on left arm and fever next day on 3/11.  Patient was seen by PCP labs showed worsening thrombocytopenia and leukopenia Dr. Marin Olp from oncology was contacted by PCP and patient was admitted to [facility]. In [place] ED: Patient was febrile to 100.51F. BP's mildly elevated. Vitals otherwise stable. CBC with Plt 13, Hgb 10.2, WBC 1.6. CMP WNL. Lactate WNL. UA without evidence of infection. Blood cx drawn.  CXR without acute cardiopulmonary abnormality.  Patient was admitted seen by oncologist and was started on therapeutic plasma exchange 05/17/19 for 7 days.  Patient is starts to improve clinically, platelet has been uptrending every day.  Subjective:  Ambulating in the room. Afebrile overnight  LDH 369->227->206->194->186->176. plt at 91k.  Assessment & Plan:  TTP with relapse: With severe thrombocytopenia. Hematology oncology on board and started on  plasma exchange 05/17/19.Continue daily plasma exchange plan for total 7 days-last dose for Friday.Platelet increasing .her LDH is improved.  s/p second dose of rituximab 3/17. Continue to monitor CBC daily.   Recent Labs  Lab 05/19/19 0444 05/20/19 0300 05/21/19 0409 05/22/19 0710 05/23/19 0318  PLT 38* 42* 58* 71* 91*   Leukopenia/neutropenia in the setting of #1.  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 Continue neutropenic precaution.  Monitor closely, appreciate oncology input. Recent Labs  Lab 05/19/19 0444 05/20/19 0300 05/21/19 0409 05/22/19 0710 05/23/19 0318  WBC 2.0* 1.4*  1.4* 1.5* 3.2*   Hypokalemia:Repleted  Controlled type 2 diabetes mellitus without complication, hemoglobin A1c stable at 6.8.  Blood sugar overall stable.We will continue sliding scale insulin.  Continue home linagliptin. Recent Labs  Lab 05/21/19 2124 05/22/19 0720 05/22/19 1136 05/22/19 2051 05/23/19 0700  GLUCAP 262* 125* 151* 312* 152*   Liver steatosis and nodular contour cystic of some underlying cirrhotic changes- had [place] abd in 03/03/19 secondary to NASH, LFTs up, ast/alt elevated 107/50.  Monitor.  Cellulitis left arm in the setting of fever and leukopenia.  completyed Keflex x 5 days.  Resolved  ?  Fungal infection of the left foot, cont nystatin cream.  Small area of mucosal hematoma on the left upper jaw- resolved.  Hypertension:BP stable.  Continue home dose of Lasix 40 mg.   Body mass index is 27.9 kg/m.   DVT prophylaxis:SCD Code Status:full Family Communication: plan of care discussed with patient at bedside. Disposition Plan: Patient is from:home Anticipated Disposition: to home, on friday Barriers to discharge or conditions that needs to be met prior to discharge: Patient admitted with severe thrombocytopenia, relapse of TTP remains in the hospital for ongoing management with plasma exchange x for 7 days-last treatment on Friday and possible discharge after procedure.  Consultants: hematology/oncology Procedures:see note Microbiology:see note  Medications: Scheduled Meds: . Chlorhexidine Gluconate Cloth  6 each Topical Daily  . folic acid  2 mg Oral Daily  . furosemide  40 mg Oral Daily  . insulin aspart  0-15 Units Subcutaneous TID WC  . mometasone   Topical Daily  . nystatin cream   Topical BID  . sitaGLIPtin  100 mg Oral Daily  . sodium chloride flush  10-40  mL Intracatheter Q12H   Continuous Infusions: . anticoagulant sodium citrate    . citrate dextrose      Antimicrobials: Anti-infectives (From admission, onward)   Start     Dose/Rate  Route Frequency Ordered Stop   05/16/19 1800  cephALEXin (KEFLEX) capsule 500 mg     500 mg Oral Every 6 hours 05/16/19 1636 05/21/19 1759       Objective: Vitals: Today's Vitals   05/22/19 1910 05/22/19 1930 05/22/19 1937 05/23/19 0429  BP: 137/85  (!) 146/88 (!) 155/72  Pulse: 88  93 93  Resp: (!) 25  18 17   Temp: 98 F (36.7 C)  98.8 F (37.1 C) 98.8 F (37.1 C)  TempSrc:   Oral Oral  SpO2: 98%  99% 100%  Weight:      Height:      PainSc: 0-No pain 0-No pain      Intake/Output Summary (Last 24 hours) at 05/23/2019 0741 Last data filed at 05/23/2019 0600 Gross per 24 hour  Intake 1540 ml  Output 0 ml  Net 1540 ml   Filed Weights   05/16/19 0937 05/20/19 0609  Weight: 67.1 kg 69.2 kg   Weight change:    Intake/Output from previous day: 03/17 0701 - 03/18 0700 In: 1540 [P.O.:1540] Out: 0  Intake/Output this shift: No intake/output data recorded.  Examination:  General exam: AAO X3, on room air HEENT:Oral mucosa moist, Ear/Nose WNL grossly, dentition normal. Respiratory system: bilaterally clear, no use of accessory muscle Cardiovascular system: S1 & S2 +, No JVD,. Gastrointestinal system: Abdomen soft, NT,ND, BS+ Nervous System:Alert, awake, moving extremities and grossly nonfocal Extremities: No edema, distal peripheral pulses palpable.  Skin: No rashes,no icterus. MSK: Normal muscle bulk,tone, power Right chest with HD catheter present no bleeding.  Skin intact.  Data Reviewed: I have personally reviewed following labs and imaging studies CBC: Recent Labs  Lab 05/19/19 0444 05/20/19 0300 05/21/19 0409 05/22/19 0710 05/23/19 0318  WBC 2.0* 1.4* 1.4* 1.5* 3.2*  NEUTROABS 1.4* 0.8* 0.6* 0.6* 2.7  HGB 10.0* 8.5* 9.1* 8.5* 9.1*  HCT 29.2* 25.5* 27.8* 25.9* 27.0*  MCV 91.3 92.7 94.6 94.2 92.2  PLT 38* 42* 58* 71* 91*   Basic Metabolic Panel: Recent Labs  Lab 05/20/19 0839 05/21/19 0409 05/21/19 1643 05/22/19 1350 05/23/19 0318  NA 138 140  140 138 139  K 3.5 3.8 3.8 3.6 3.5  CL 100 104 105 105 104  CO2 28 29 27 25 26   GLUCOSE 228* 202* 169* 257* 233*  BUN 8 9 10 7 10   CREATININE 0.81 0.98 0.82 0.82 0.76  CALCIUM 9.1 9.1 9.0 8.8* 9.1   GFR: Estimated Creatinine Clearance: 69 mL/min (by C-G formula based on SCr of 0.76 mg/dL). Liver Function Tests: Recent Labs  Lab 05/18/19 0326 05/19/19 0444 05/20/19 0300 05/21/19 0409 05/23/19 0318  AST 54* 51* 52* 50* 30  ALT 28 27 29 30 22   ALKPHOS 57 52 54 59 64  BILITOT 1.3* 0.9 0.9 0.7 0.6  PROT 6.3* 5.9* 5.3* 5.6* 5.6*  ALBUMIN 3.3* 3.2* 3.0* 3.1* 3.1*   No results for input(s): LIPASE, AMYLASE in the last 168 hours. No results for input(s): AMMONIA in the last 168 hours. Coagulation Profile: No results for input(s): INR, PROTIME in the last 168 hours. Cardiac Enzymes: No results for input(s): CKTOTAL, CKMB, CKMBINDEX, TROPONINI in the last 168 hours. BNP (last 3 results) No results for input(s): PROBNP in the last 8760 hours. HbA1C: No results for input(s): HGBA1C in  the last 72 hours. CBG: Recent Labs  Lab 05/21/19 2124 05/22/19 0720 05/22/19 1136 05/22/19 2051 05/23/19 0700  GLUCAP 262* 125* 151* 312* 152*   Lipid Profile: No results for input(s): CHOL, HDL, LDLCALC, TRIG, CHOLHDL, LDLDIRECT in the last 72 hours. Thyroid Function Tests: No results for input(s): TSH, T4TOTAL, FREET4, T3FREE, THYROIDAB in the last 72 hours. Anemia Panel: Recent Labs    05/21/19 0409  RETICCTPCT 1.5   Sepsis Labs: Recent Labs  Lab 05/16/19 1013 05/16/19 1545 05/17/19 0320 05/18/19 0326  PROCALCITON  --  0.76 0.68 0.52  LATICACIDVEN 1.1  --   --   --     Recent Results (from the past 240 hour(s))  Culture, blood (routine x 2)     Status: None   Collection Time: 05/16/19 10:13 AM   Specimen: BLOOD  Result Value Ref Range Status   Specimen Description   Final    BLOOD RIGHT ANTECUBITAL Performed at [facility], [address] [place], [place]  [address]    Special Requests   Final    BOTTLES DRAWN AEROBIC AND ANAEROBIC Blood Culture adequate volume Performed at [facility], [address] [place], [place] [address]    Culture   Final    NO GROWTH 5 DAYS Performed at [facility], [place] [address] [place], [place] [address]    Report Status 05/21/2019 FINAL  Final  Culture, blood (routine x 2)     Status: None   Collection Time: 05/16/19 10:23 AM   Specimen: BLOOD  Result Value Ref Range Status   Specimen Description   Final    BLOOD LEFT ANTECUBITAL Performed at [facility], [address] [place], [place] [address]    Special Requests   Final    BOTTLES DRAWN AEROBIC AND ANAEROBIC Blood Culture adequate volume Performed at [facility], [address] [place], [place] [address]    Culture   Final    NO GROWTH 5 DAYS Performed at [facility], [place] [address], [place], [place] [address]    Report Status 05/21/2019 FINAL  Final  Urine culture     Status: Abnormal   Collection Time: 05/16/19 10:24 AM   Specimen: Urine, Random  Result Value Ref Range Status   Specimen Description   Final    URINE, RANDOM Performed at [facility], [address] [place], [place] [address]    Special Requests   Final    NONE Performed at [facility], [address] [place], [place] [address]    Culture MULTIPLE SPECIES PRESENT, SUGGEST RECOLLECTION (A)  Final   Report Status 05/17/2019 FINAL  Final  Respiratory Panel by RT PCR (Flu A&B, Covid) - Nasopharyngeal Swab     Status: None   Collection Time: 05/16/19 12:01 PM   Specimen: Nasopharyngeal Swab  Result Value Ref Range Status   SARS Coronavirus 2 by RT PCR NEGATIVE NEGATIVE Final    Comment: (NOTE) SARS-CoV-2 target nucleic acids are NOT DETECTED. The SARS-CoV-2 RNA is generally detectable in upper respiratoy specimens during the acute phase of infection. The lowest concentration of SARS-CoV-2 viral  copies this assay can detect is 131 copies/mL. A negative result does not preclude SARS-Cov-2 infection and should not be used as the sole basis for treatment or other patient management decisions. A negative result may occur with  improper specimen collection/handling, submission of specimen other  than nasopharyngeal swab, presence of viral mutation(s) within the areas targeted by this assay, and inadequate number of viral copies (<131 copies/mL). A negative result must be combined with clinical observations, patient history, and epidemiological information. The expected result is Negative. Fact Sheet for Patients:  PinkCheek.be Fact Sheet for Healthcare Providers:  GravelBags.it This test is not yet ap proved or cleared by the [place] FDA and  has been authorized for detection and/or diagnosis of SARS-CoV-2 by FDA under an Emergency Use Authorization (EUA). This EUA will remain  in effect (meaning this test can be used) for the duration of the COVID-19 declaration under Section 564(b)(1) of the Act, 21 U.S.C. section 360bbb-3(b)(1), unless the authorization is terminated or revoked sooner.    Influenza A by PCR NEGATIVE NEGATIVE Final   Influenza B by PCR NEGATIVE NEGATIVE Final    Comment: (NOTE) The Xpert Xpress SARS-CoV-2/FLU/RSV assay is intended as an aid in  the diagnosis of influenza from Nasopharyngeal swab specimens and  should not be used as a sole basis for treatment. Nasal washings and  aspirates are unacceptable for Xpert Xpress SARS-CoV-2/FLU/RSV  testing. Fact Sheet for Patients: PinkCheek.be Fact Sheet for Healthcare Providers: GravelBags.it This test is not yet approved or cleared by the [place] FDA and  has been authorized for detection and/or diagnosis of SARS-CoV-2 by  FDA under an Emergency Use Authorization (EUA). This EUA will  remain  in effect (meaning this test can be used) for the duration of the  Covid-19 declaration under Section 564(b)(1) of the Act, 21  U.S.C. section 360bbb-3(b)(1), unless the authorization is  terminated or revoked. Performed at [facility], [address], [place], [place] [address]       Radiology Studies: No results found.   LOS: 7 days   Time spent: More than 50% of that time was spent in counseling and/or coordination of care.  Antonieta Pert, MD [facility]  05/23/2019, 7:41 AM

## 2019-05-23 NOTE — Progress Notes (Signed)
Vicki [name] is doing very nicely.  Her platelet count now is 91,000.  Her LDH is down to 176.  Her white cell count is 3.2.  I am sure the white cell count probably is up because of the steroid that she got yesterday with her Rituxan.  She again, is responding nicely.  She is having no problems with fever.  There is no diarrhea.  She has had no cough or shortness of breath.  I think what would be a good idea is to do a plasma exchange today and Friday.  We can then try to do outpatient plasma exchange Monday-Wednesday-Friday starting next week.  She has had no obvious bleeding.  She does have these areas that look like warts on the fingers. We will try some topical steroids.  On her physical exam, her temperature is 98.8. Pulse 93. Blood pressure 155/72. Her lungs are clear bilaterally. Cardiac exam regular rate and rhythm. Abdomen is soft. Skin exam shows no rashes outside of that with the fingers on the right hand. Neurological exam is nonfocal.  Again, Vicki [name] has a relapsed TTP. She is responding very nicely to plasma exchange. She got a Rituxan yesterday.  Again, I would do plasma exchange today and Friday. After that, I would then do outpatient plasma exchange Monday-Wednesday-Friday.  I would plan for discharge after plasma exchange on Friday. I think she would really do well with this.  I appreciate all the great care that she is gotten from all the staff up on [address].  Lattie Haw, MD  [address] [place] 3:15

## 2019-05-23 NOTE — Telephone Encounter (Signed)
Call received from patient requesting that letter be faxed to [phone] stating that she will be out of work until the end of the month d/t her medical condition.  Dr. Marin Olp notified and letter faxed per pt.'s request.

## 2019-05-23 NOTE — Procedures (Signed)
Pre-procedure Diagnosis: ITP Post-procedure Diagnosis: Same  Successful conversion of existing temporary HD catheter to a tunneled HD catheter with tips terminating within the superior aspect of the right atrium.    Complications: None Immediate EBL: Trace  The catheter is ready for immediate use.   Ronny Bacon, MD Pager #: [phone]

## 2019-05-24 ENCOUNTER — Other Ambulatory Visit (HOSPITAL_COMMUNITY): Payer: Federal, State, Local not specified - PPO

## 2019-05-24 LAB — THERAPEUTIC PLASMA EXCHANGE (BLOOD BANK)
Plasma Exchange: 3000
Plasma volume needed: 3000
Unit division: 0
Unit division: 0
Unit division: 0
Unit division: 0
Unit division: 0
Unit division: 0
Unit division: 0
Unit division: 0
Unit division: 0
Unit division: 0
Unit division: 0
Unit division: 0

## 2019-05-24 LAB — CBC WITH DIFFERENTIAL/PLATELET
Abs Immature Granulocytes: 0.01 10*3/uL (ref 0.00–0.07)
Basophils Absolute: 0 10*3/uL (ref 0.0–0.1)
Basophils Relative: 0 %
Eosinophils Absolute: 0 10*3/uL (ref 0.0–0.5)
Eosinophils Relative: 2 %
HCT: 25 % — ABNORMAL LOW (ref 36.0–46.0)
Hemoglobin: 8.3 g/dL — ABNORMAL LOW (ref 12.0–15.0)
Immature Granulocytes: 0 %
Lymphocytes Relative: 23 %
Lymphs Abs: 0.6 10*3/uL — ABNORMAL LOW (ref 0.7–4.0)
MCH: 31 pg (ref 26.0–34.0)
MCHC: 33.2 g/dL (ref 30.0–36.0)
MCV: 93.3 fL (ref 80.0–100.0)
Monocytes Absolute: 0.2 10*3/uL (ref 0.1–1.0)
Monocytes Relative: 8 %
Neutro Abs: 1.7 10*3/uL (ref 1.7–7.7)
Neutrophils Relative %: 67 %
Platelets: 97 10*3/uL — ABNORMAL LOW (ref 150–400)
RBC: 2.68 MIL/uL — ABNORMAL LOW (ref 3.87–5.11)
RDW: 12.9 % (ref 11.5–15.5)
WBC: 2.5 10*3/uL — ABNORMAL LOW (ref 4.0–10.5)
nRBC: 0 % (ref 0.0–0.2)

## 2019-05-24 LAB — GLUCOSE, CAPILLARY
Glucose-Capillary: 137 mg/dL — ABNORMAL HIGH (ref 70–99)
Glucose-Capillary: 199 mg/dL — ABNORMAL HIGH (ref 70–99)

## 2019-05-24 LAB — POCT I-STAT, CHEM 8
BUN: 9 mg/dL (ref 6–20)
Calcium, Ion: 1.25 mmol/L (ref 1.15–1.40)
Chloride: 100 mmol/L (ref 98–111)
Creatinine, Ser: 0.6 mg/dL (ref 0.44–1.00)
Glucose, Bld: 156 mg/dL — ABNORMAL HIGH (ref 70–99)
HCT: 25 % — ABNORMAL LOW (ref 36.0–46.0)
Hemoglobin: 8.5 g/dL — ABNORMAL LOW (ref 12.0–15.0)
Potassium: 2.8 mmol/L — ABNORMAL LOW (ref 3.5–5.1)
Sodium: 143 mmol/L (ref 135–145)
TCO2: 29 mmol/L (ref 22–32)

## 2019-05-24 LAB — COMPREHENSIVE METABOLIC PANEL
ALT: 25 U/L (ref 0–44)
AST: 39 U/L (ref 15–41)
Albumin: 3 g/dL — ABNORMAL LOW (ref 3.5–5.0)
Alkaline Phosphatase: 57 U/L (ref 38–126)
Anion gap: 10 (ref 5–15)
BUN: 8 mg/dL (ref 6–20)
CO2: 29 mmol/L (ref 22–32)
Calcium: 8.9 mg/dL (ref 8.9–10.3)
Chloride: 104 mmol/L (ref 98–111)
Creatinine, Ser: 0.83 mg/dL (ref 0.44–1.00)
GFR calc Af Amer: >60 mL/min (ref >60)
GFR calc non Af Amer: >60 mL/min (ref >60)
Glucose, Bld: 136 mg/dL — ABNORMAL HIGH (ref 70–99)
Potassium: 3.8 mmol/L (ref 3.5–5.1)
Sodium: 143 mmol/L (ref 135–145)
Total Bilirubin: 0.6 mg/dL (ref 0.3–1.2)
Total Protein: 5.1 g/dL — ABNORMAL LOW (ref 6.5–8.1)

## 2019-05-24 LAB — LACTATE DEHYDROGENASE: LDH: 155 U/L (ref 98–192)

## 2019-05-24 MED ORDER — ACD FORMULA A 0.73-2.45-2.2 GM/100ML VI SOLN
Status: AC
  Filled 2019-05-24: qty 500

## 2019-05-24 MED ORDER — DIPHENHYDRAMINE HCL 25 MG PO CAPS
25.0000 mg | ORAL_CAPSULE | Freq: Once | ORAL | Status: AC
  Administered 2019-05-24: 25 mg via ORAL
  Filled 2019-05-24: qty 1

## 2019-05-24 MED ORDER — DIPHENHYDRAMINE HCL 25 MG PO CAPS
25.0000 mg | ORAL_CAPSULE | Freq: Four times a day (QID) | ORAL | Status: DC | PRN
  Administered 2019-05-24: 25 mg via ORAL

## 2019-05-24 MED ORDER — SODIUM CHLORIDE 0.9 % IV SOLN
2.0000 g | Freq: Once | INTRAVENOUS | Status: AC
  Administered 2019-05-24: 2 g via INTRAVENOUS
  Filled 2019-05-24: qty 20

## 2019-05-24 MED ORDER — ANTICOAGULANT SODIUM CITRATE 4% (200MG/5ML) IV SOLN
5.0000 mL | Freq: Once | Status: AC
  Administered 2019-05-24: 5 mL
  Filled 2019-05-24: qty 5

## 2019-05-24 MED ORDER — ACD FORMULA A 0.73-2.45-2.2 GM/100ML VI SOLN
1000.0000 mL | Status: DC
  Administered 2019-05-24: 1000 mL
  Filled 2019-05-24: qty 1000

## 2019-05-24 MED ORDER — CALCIUM CARBONATE ANTACID 500 MG PO CHEW
2.0000 | CHEWABLE_TABLET | ORAL | Status: AC
  Administered 2019-05-24: 400 mg via ORAL

## 2019-05-24 MED ORDER — CALCIUM CARBONATE ANTACID 500 MG PO CHEW
CHEWABLE_TABLET | ORAL | Status: AC
  Filled 2019-05-24: qty 2

## 2019-05-24 MED ORDER — ACETAMINOPHEN 325 MG PO TABS: 650.0000 mg | ORAL_TABLET | ORAL | Status: DC | PRN

## 2019-05-24 MED ORDER — DIPHENHYDRAMINE HCL 25 MG PO CAPS
ORAL_CAPSULE | ORAL | Status: AC
  Filled 2019-05-24: qty 1

## 2019-05-24 NOTE — Progress Notes (Signed)
DISCHARGE NOTE HOME Vicki [name] to be discharged Home per MD order. Discussed prescriptions and follow up appointments with the patient. Prescriptions given to patient; medication list explained in detail. Patient verbalized understanding.  Skin clean, dry and intact without evidence of skin break down, no evidence of skin tears noted. IV catheter discontinued intact. Site without signs and symptoms of complications. Dressing and pressure applied. Pt denies pain at the site currently. No complaints noted.  Patient free of lines, drains, and wounds.   An After Visit Summary (AVS) was printed and given to the patient. Patient escorted via wheelchair, and discharged home via private auto.  Dolores Hoose, RN

## 2019-05-24 NOTE — Plan of Care (Signed)
  Problem: Health Behavior/Discharge Planning: Goal: Ability to manage health-related needs will improve Outcome: Adequate for Discharge   Problem: Clinical Measurements: Goal: Ability to maintain clinical measurements within normal limits will improve Outcome: Adequate for Discharge Goal: Will remain free from infection Outcome: Adequate for Discharge Goal: Diagnostic test results will improve Outcome: Adequate for Discharge Goal: Respiratory complications will improve Outcome: Adequate for Discharge Goal: Cardiovascular complication will be avoided Outcome: Adequate for Discharge   Problem: Activity: Goal: Risk for activity intolerance will decrease Outcome: Adequate for Discharge

## 2019-05-24 NOTE — Discharge Summary (Signed)
Physician Discharge Summary  Vicki [name] OIN:[id number] DOB: [birth year] DOA: 05/16/2019  PCP: Lesleigh Noe, MD  Admit date: 05/16/2019 Discharge date: 05/24/2019  Admitted From: home Disposition:  home  Recommendations for Outpatient Follow-up:  1. Follow up with PCP in 1-2 weeks 2. Please obtain BMP/CBC in one week 3. 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  Patient was a started on her first infusion of rituximab 3/10.  She felt fine but noted rash on left arm and fever next day on 3/11.  Patient was seen by PCP labs showed worsening thrombocytopenia and leukopenia Dr. Marin Olp from oncology was contacted by PCP and patient was admitted to [facility]. In [place] ED: Patient was febrile to 100.60F. BP's mildly elevated. Vitals otherwise stable. CBC with Plt 13, Hgb 10.2, WBC 1.6. CMP WNL. Lactate WNL. UA without evidence of infection. Blood cx drawn.  CXR without acute cardiopulmonary abnormality.  Patient was admitted seen by oncologist and was started on therapeutic plasma exchange 05/17/19 for 7 days.  Patient is starts to improve clinically, platelet has been uptrending every day.  Patient was continued on daily plasmapheresis, with last dose 3/19.  Platelet has improved to 97,000.  Temporary HD catheter converted to tunnel dialysis catheter on 3/18 by IR in anticipation of outpatient plasma exchange by Dr. Marin Olp. She got plasmapheresis thsi afternoon and did well, spoke to her post treatment and stable for d/c .  Discharge Diagnoses:   TTP with relapse: With severe thrombocytopenia. Hematology oncology on board and started on  plasma  exchange 05/17/19 x total 7 days-last dose 3/19 her LDH is improved.  s/p second dose of rituximab 3/17.   Platelet has improved.  Temporary dialysis catheter converted to tunneled HD catheter 3/18r outpatient plasmapheresis and will be followed by Dr. Marin Olp Recent Labs  Lab 05/20/19 0300 05/21/19 0409 05/22/19 0710 05/23/19 0318 05/24/19 0531  PLT 42* 58* 71* 91* 97*   Leukopenia/neutropenia in the setting of #1.  WBC improved to 2500. 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 Continue neutropenic precaution.  Monitor closely, appreciate oncology input. Recent Labs  Lab 05/20/19 0300 05/21/19 0409 05/22/19 0710 05/23/19 0318 05/24/19 0531  WBC 1.4* 1.4* 1.5* 3.2* 2.5*   Hypokalemia:Repleted  Controlled type 2 diabetes mellitus without complication, hemoglobin A1c stable at 6.8.    Resume home medication on discharge.    Liver steatosis and nodular contour cystic of some underlying cirrhotic changes- had [place] abd in 03/03/19 secondary to NASH, LFTs up, ast/alt elevated 107/50.  Monitor.  Cellulitis left arm in the setting of fever and leukopenia.  completyed Keflex x 5 days.  Resolved  ? Fungal infection of the left foot, cont nystatin cream as needed.  Small area of mucosal hematoma on the left upper jaw- resolved.  Hypertension:BP stable.  Continue home dose of Lasix 40 mg.   Body mass index is 27.9 kg/m.   Disposition patient will be discharged home after completion of 4/7 plasmapheresis today  Consultants: hematology/oncology Procedures: Plasmapheresis x7 days Temporary dialysis catheter converted to tunnel HD catheter 3/18   Subjective: Resting well. No new complaints, no nausea, vomiting chest pain  Discharge Exam: Vitals:   05/24/19 1258 05/24/19 1335  BP: (!) 90/52 107/60  Pulse: 83 83  Resp: 19   Temp: 98.2 F (36.8 C)   SpO2:  100%   General: Pt is alert, awake, not in acute distress Cardiovascular: RRR, S1/S2 +, no rubs, no  gallops Respiratory: CTA bilaterally, no wheezing, no rhonchi Abdominal: Soft, NT, ND, bowel sounds + Extremities: no edema, no cyanosis  Discharge Instructions  Discharge Instructions    Diet Carb Modified   Complete by: As directed    Discharge instructions   Complete by: As directed    Follow-up with your oncologist on outpatient basis for plasmapheresis and CBC monitoring.  Please call call MD or return to ER for similar or worsening recurring problem that brought you to hospital or if any fever,nausea/vomiting,abdominal pain, uncontrolled pain, chest pain,  shortness of breath or any other alarming symptoms.  Please follow-up your doctor as instructed in a week time and call the office for appointment.  Please avoid alcohol, smoking, or any other illicit substance and maintain healthy habits including taking your regular medications as prescribed.  You were cared for by a hospitalist during your hospital stay. If you have any questions about your discharge medications or the care you received while you were in the hospital after you are discharged, you can call the unit and ask to speak with the hospitalist on call if the hospitalist that took care of you is not available.  Once you are discharged, your primary care physician will handle any further medical issues. Please note that NO REFILLS for any discharge medications will be authorized once you are discharged, as it is imperative that you return to your primary care physician (or establish a relationship with a primary care physician if you do not have one) for your aftercare needs so that they can reassess your need for medications and monitor your lab values   Increase activity slowly   Complete by: As directed      Allergies as of 05/24/2019      Reactions   Metformin And Related Itching   Heart palpitation   Sulfa Antibiotics Itching   Sulfasalazine Itching      Medication List    STOP taking these medications    lidocaine-prilocaine cream Commonly known as: EMLA   traMADol 50 MG tablet Commonly known as: Ultram     TAKE these medications   ALLEGRA-D 24 HOUR PO Take by mouth.   calcium carbonate 500 MG chewable tablet Commonly known as: TUMS - dosed in mg elemental calcium Chew 2 tablets (400 mg of elemental calcium total) by mouth every 3 (three) hours. What changed:   when to take this  reasons to take this   cholecalciferol 25 MCG (1000 UNIT) tablet Commonly known as: VITAMIN D3 Take 1,000 Units by mouth daily.   dextromethorphan-guaiFENesin 30-600 MG 12hr tablet Commonly known as: MUCINEX DM Take 2 tablets by mouth 2 (two) times daily as needed for cough.   diphenhydrAMINE 50 MG capsule Commonly known as: BENADRYL Take 1 capsule (50 mg total) by mouth daily as needed (on call for plasma exchange).   folic acid 1 MG tablet Commonly known as: FOLVITE Take 2 tablets (2 mg total) by mouth daily.   furosemide 40 MG tablet Commonly known as: Lasix Take 1 tablet (40 mg total) by mouth daily.   GAVISCON EXTRA STRENGTH PO Take 1 tablet by mouth 4 (four) times daily as needed.   glucose blood test strip One touch ultra   Multi For Her 50+ Tabs Take 1 tablet by  mouth daily.   potassium chloride 10 MEQ tablet Commonly known as: KLOR-CON Take 1 tablet (10 mEq total) by mouth daily.   senna-docusate 8.6-50 MG tablet Commonly known as: Senokot-S Take 2 tablets by mouth 2 (two) times daily.   sitaGLIPtin 100 MG tablet Commonly known as: JANUVIA Take 1 tablet (100 mg total) by mouth daily.      Follow-up Information    Lesleigh Noe, MD Follow up in 1 week(s).   Specialty: Family Medicine Contact information: [place] [address] [phone]        Lorretta Harp, MD .   Specialties: Cardiology, Radiology Contact information: [address] [place] [address] [phone]        Volanda Napoleon, MD. Call.    Specialty: Oncology Contact information: [address] [place] [address] [phone]          Allergies  Allergen Reactions  . Metformin And Related Itching    Heart palpitation  . Sulfa Antibiotics Itching  . Sulfasalazine Itching    The results of significant diagnostics from this hospitalization (including imaging, microbiology, ancillary and laboratory) are listed below for reference.    Microbiology: Recent Results (from the past 240 hour(s))  Culture, blood (routine x 2)     Status: None   Collection Time: 05/16/19 10:13 AM   Specimen: BLOOD  Result Value Ref Range Status   Specimen Description   Final    BLOOD RIGHT ANTECUBITAL Performed at [facility], [address] [place], [place] [address]    Special Requests   Final    BOTTLES DRAWN AEROBIC AND ANAEROBIC Blood Culture adequate volume Performed at [facility], [address] [place], [place] [address]    Culture   Final    NO GROWTH 5 DAYS Performed at [facility], [place] [address] [place], [place] [address]    Report Status 05/21/2019 FINAL  Final  Culture, blood (routine x 2)     Status: None   Collection Time: 05/16/19 10:23 AM   Specimen: BLOOD  Result Value Ref Range Status   Specimen Description   Final    BLOOD LEFT ANTECUBITAL Performed at [facility], [address] [place], [place] [address]    Special Requests   Final    BOTTLES DRAWN AEROBIC AND ANAEROBIC Blood Culture adequate volume Performed at [facility], [address] [place], [place] [address]    Culture   Final    NO GROWTH 5 DAYS Performed at [facility], [place] [address], [place], [place] [address]    Report Status 05/21/2019 FINAL  Final  Urine culture     Status: Abnormal   Collection Time: 05/16/19 10:24 AM   Specimen: Urine, Random  Result Value Ref Range Status   Specimen Description   Final    URINE, RANDOM Performed at [facility], [address] [place], [place] [address]    Special Requests   Final    NONE Performed at [facility], [address] [place], [place] [address]    Culture MULTIPLE SPECIES PRESENT, SUGGEST RECOLLECTION (A)  Final   Report Status 05/17/2019 FINAL  Final  Respiratory Panel by RT PCR (Flu A&B, Covid) - Nasopharyngeal Swab     Status: None   Collection Time: 05/16/19 12:01 PM   Specimen: Nasopharyngeal Swab  Result  Value Ref Range Status   SARS Coronavirus 2 by RT PCR NEGATIVE NEGATIVE Final    Comment: (NOTE) SARS-CoV-2 target nucleic acids are NOT DETECTED. The SARS-CoV-2 RNA is generally detectable in upper respiratoy specimens during the acute phase of infection. The lowest concentration of SARS-CoV-2 viral copies this assay can detect is 131 copies/mL. A negative result does not preclude SARS-Cov-2 infection and should not be used as the sole basis for treatment or other patient management decisions. A negative result may occur with  improper specimen collection/handling, submission of specimen other than nasopharyngeal swab, presence of viral mutation(s) within the areas targeted by this assay, and inadequate number of viral copies (<131 copies/mL). A negative result must be combined with clinical observations, patient history, and epidemiological information. The expected result is Negative. Fact Sheet for Patients:  PinkCheek.be Fact Sheet for Healthcare Providers:  GravelBags.it This test is not yet ap proved or cleared by the [place] FDA and  has been authorized for detection and/or diagnosis of SARS-CoV-2 by FDA under an Emergency Use Authorization (EUA). This EUA will remain  in effect (meaning this test can be used) for the duration of the COVID-19 declaration under Section 564(b)(1) of the Act, 21 U.S.C. section 360bbb-3(b)(1), unless the authorization is terminated or revoked  sooner.    Influenza A by PCR NEGATIVE NEGATIVE Final   Influenza B by PCR NEGATIVE NEGATIVE Final    Comment: (NOTE) The Xpert Xpress SARS-CoV-2/FLU/RSV assay is intended as an aid in  the diagnosis of influenza from Nasopharyngeal swab specimens and  should not be used as a sole basis for treatment. Nasal washings and  aspirates are unacceptable for Xpert Xpress SARS-CoV-2/FLU/RSV  testing. Fact Sheet for Patients: PinkCheek.be Fact Sheet for Healthcare Providers: GravelBags.it This test is not yet approved or cleared by the [place] FDA and  has been authorized for detection and/or diagnosis of SARS-CoV-2 by  FDA under an Emergency Use Authorization (EUA). This EUA will remain  in effect (meaning this test can be used) for the duration of the  Covid-19 declaration under Section 564(b)(1) of the Act, 21  U.S.C. section 360bbb-3(b)(1), unless the authorization is  terminated or revoked. Performed at [facility], [address] [place], [place] [address]     Procedures/Studies: [place] Chest 2 View  Result Date: 05/20/2019 CLINICAL DATA:  Wheezing EXAM: CHEST - 2 VIEW COMPARISON:  May 16, 2019 FINDINGS: Central catheter tip is at the superior vena cava-right atrium junction. No pneumothorax. Lungs are clear. Heart size and pulmonary vascularity are normal. No adenopathy. No bone lesions. IMPRESSION: Central catheter tip at cavoatrial junction. No pneumothorax. Lungs clear. Cardiac silhouette normal. Electronically Signed   By: Lowella Grip III M.D.   On: 05/20/2019 08:11   IR Fluoro Guide CV Line Right  Result Date: 05/23/2019 INDICATION: History of TTP, in need of durable intravenous access for plasmapheresis therapy. Please convert temporary dialysis catheter to a tunneled hemodialysis catheter. EXAM: FLUOROSCOPIC GUIDED CONVERSION OF NON TUNNELED TO TUNNELED CENTRAL VENOUS HEMODIALYSIS CATHETER COMPARISON:   Image guided temporary dialysis catheter placement-05/17/2018 MEDICATIONS: Ancef 2 g IV; the antibiotics were administered with an appropriate time frame prior to the initiation of the procedure. ANESTHESIA/SEDATION: Moderate (conscious) sedation was employed during this procedure. Fentanyl 50 mcg was administered intravenously. Moderate Sedation Time: 10 minutes. The patient's level of consciousness and vital signs were monitored continuously by radiology nursing throughout the procedure under my direct supervision. FLUOROSCOPY TIME:  36 seconds (8.1 mGy) COMPLICATIONS:  None immediate. PROCEDURE: Informed written consent was obtained from the patient after a discussion of the risks, benefits, and alternatives to treatment. Questions regarding the procedure were encouraged and answered. The skin and external portion of the existing hemodialysis catheter was prepped with chlorhexidine in a sterile fashion, and a sterile drape was applied covering the operative field. Maximum barrier sterile technique with sterile gowns and gloves were used for the procedure. A timeout was performed prior to the initiation of the procedure. A lumen of the none tunneled temporary dialysis catheter was cannulated with a stiff Glidewire in utilized for measurement purposes. Next, the Glidewire was advanced to the level of the IVC. A 19 cm tip to cuff HemoSplit dialysis catheter was tunneled from a site along the anterior chest to the venotomy site. Under intermittent fluoroscopic guidance, the temporary dialysis catheter was exchanged for a peel-away sheath. The tunneled dialysis catheter was inserted into the peel-away sheath with tips open terminating within in the superior aspect the right atrium. Final catheter positioning was confirmed and documented with a spot fluoroscopic image. The catheter aspirates and flushes normally. The catheter was flushed with appropriate volume heparin dwells. The catheter exit site was secured with a  0-Prolene retention sutures. The venotomy site was apposed with derma bond Steri-Strips. Both lumens were heparinized. A dressing was placed. The patient tolerated the procedure well without immediate post procedural complication. IMPRESSION: Successful fluoroscopic guided conversion of a temporary to a permanent 19 cm tip to cuff tunneled hemodialysis catheter with tips terminating within the right atrium. The catheter is ready for immediate use. Electronically Signed   By: Sandi Mariscal M.D.   On: 05/23/2019 16:16   IR Fluoro Guide CV Line Right  Result Date: 05/17/2019 INDICATION: Recurrent TTP, access for plasmapheresis EXAM: ULTRASOUND GUIDANCE FOR VASCULAR ACCESS RIGHT IJ TEMPORARY MAHURKAR CATHETER FOR PLASMAPHERESIS MEDICATIONS: 1% LIDOCAINE LOCAL ANESTHESIA/SEDATION: Moderate Sedation Time: None. The patient's level of consciousness and vital signs were monitored continuously by radiology nursing throughout the procedure under my direct supervision. FLUOROSCOPY TIME:  Fluoroscopy Time: 0 minutes 42 seconds (1.0 mGy). COMPLICATIONS: None immediate. PROCEDURE: Informed written consent was obtained from the patient after a thorough discussion of the procedural risks, benefits and alternatives. All questions were addressed. Maximal Sterile Barrier Technique was utilized including caps, mask, sterile gowns, sterile gloves, sterile drape, hand hygiene and skin antiseptic. A timeout was performed prior to the initiation of the procedure. Under sterile conditions and local anesthesia, ultrasound micropuncture access performed of the patent right internal jugular vein. Images obtained for documentation of the patent right internal jugular vein. 018 guidewire advanced followed by the micro dilator. Amplatz guidewire exchange performed. Tract dilatation performed to insert a 16 cm Mahurkar temporary catheter for plasmapheresis. Tip position at the SVC RA junction. Images obtained for documentation. Catheter secured  with Prolene sutures and a sterile dressing. Blood aspirated easily from all lumens followed by saline and heparin flushes. External caps applied. No immediate complication. Patient tolerated the procedure well. IMPRESSION: Successful ultrasound and fluoroscopic right IJ temporary mahurkar catheter for plasmapheresis. Ready for use. Electronically Signed   By: Jerilynn Mages.  Shick M.D.   On: 05/17/2019 09:17   IR US Guide Vasc Access Right  Result Date: 05/17/2019 INDICATION: Recurrent TTP, access for plasmapheresis EXAM: ULTRASOUND GUIDANCE FOR VASCULAR ACCESS RIGHT IJ TEMPORARY MAHURKAR CATHETER FOR PLASMAPHERESIS MEDICATIONS: 1% LIDOCAINE LOCAL ANESTHESIA/SEDATION: Moderate Sedation Time: None. The patient's level of consciousness and vital signs were monitored continuously by radiology nursing throughout the procedure under  my direct supervision. FLUOROSCOPY TIME:  Fluoroscopy Time: 0 minutes 42 seconds (1.0 mGy). COMPLICATIONS: None immediate. PROCEDURE: Informed written consent was obtained from the patient after a thorough discussion of the procedural risks, benefits and alternatives. All questions were addressed. Maximal Sterile Barrier Technique was utilized including caps, mask, sterile gowns, sterile gloves, sterile drape, hand hygiene and skin antiseptic. A timeout was performed prior to the initiation of the procedure. Under sterile conditions and local anesthesia, ultrasound micropuncture access performed of the patent right internal jugular vein. Images obtained for documentation of the patent right internal jugular vein. 018 guidewire advanced followed by the micro dilator. Amplatz guidewire exchange performed. Tract dilatation performed to insert a 16 cm Mahurkar temporary catheter for plasmapheresis. Tip position at the SVC RA junction. Images obtained for documentation. Catheter secured with Prolene sutures and a sterile dressing. Blood aspirated easily from all lumens followed by saline and heparin  flushes. External caps applied. No immediate complication. Patient tolerated the procedure well. IMPRESSION: Successful ultrasound and fluoroscopic right IJ temporary mahurkar catheter for plasmapheresis. Ready for use. Electronically Signed   By: Jerilynn Mages.  Shick M.D.   On: 05/17/2019 09:17   DG Chest Port 1 View  Result Date: 05/16/2019 CLINICAL DATA:  Fever, chemo infusion yesterday EXAM: PORTABLE CHEST 1 VIEW COMPARISON:  03/18/2019 FINDINGS: Cardiomediastinal contours and hilar structures are normal. Lungs are clear. No signs of pleural effusion. Visualized skeletal structures are unremarkable. IMPRESSION: No active disease. Electronically Signed   By: Zetta Bills M.D.   On: 05/16/2019 10:04    Labs: BNP (last 3 results) No results for input(s): BNP in the last 8760 hours. Basic Metabolic Panel: Recent Labs  Lab 05/22/19 1350 05/22/19 1350 05/23/19 0318 05/23/19 1015 05/23/19 1255 05/24/19 0531 05/24/19 1055  NA 138   < > 139 139 140 143 143  K 3.6   < > 3.5 3.2* 3.1* 3.8 2.8*  CL 105   < > 104 106 105 104 100  CO2 25  --  26 24 24 29   --   GLUCOSE 257*   < > 233* 204* 178* 136* 156*  BUN 7   < > 10 10 10 8 9   CREATININE 0.82   < > 0.76 0.73 0.69 0.83 0.60  CALCIUM 8.8*  --  9.1 9.5 9.3 8.9  --    < > = values in this interval not displayed.   Liver Function Tests: Recent Labs  Lab 05/19/19 0444 05/20/19 0300 05/21/19 0409 05/23/19 0318 05/24/19 0531  AST 51* 52* 50* 30 39  ALT 27 29 30 22 25   ALKPHOS 52 54 59 64 57  BILITOT 0.9 0.9 0.7 0.6 0.6  PROT 5.9* 5.3* 5.6* 5.6* 5.1*  ALBUMIN 3.2* 3.0* 3.1* 3.1* 3.0*   No results for input(s): LIPASE, AMYLASE in the last 168 hours. No results for input(s): AMMONIA in the last 168 hours. CBC: Recent Labs  Lab 05/20/19 0300 05/20/19 0300 05/21/19 0409 05/22/19 0710 05/23/19 0318 05/24/19 0531 05/24/19 1055  WBC 1.4*  --  1.4* 1.5* 3.2* 2.5*  --   NEUTROABS 0.8*  --  0.6* 0.6* 2.7 1.7  --   HGB 8.5*   < > 9.1* 8.5*  9.1* 8.3* 8.5*  HCT 25.5*   < > 27.8* 25.9* 27.0* 25.0* 25.0*  MCV 92.7  --  94.6 94.2 92.2 93.3  --   PLT 42*  --  58* 71* 91* 97*  --    < > = values in this interval  not displayed.   Cardiac Enzymes: No results for input(s): CKTOTAL, CKMB, CKMBINDEX, TROPONINI in the last 168 hours. BNP: Invalid input(s): POCBNP CBG: Recent Labs  Lab 05/23/19 0700 05/23/19 1629 05/23/19 2052 05/24/19 0736 05/24/19 1333  GLUCAP 152* 155* 227* 137* 199*   D-Dimer No results for input(s): DDIMER in the last 72 hours. Hgb A1c No results for input(s): HGBA1C in the last 72 hours. Lipid Profile No results for input(s): CHOL, HDL, LDLCALC, TRIG, CHOLHDL, LDLDIRECT in the last 72 hours. Thyroid function studies No results for input(s): TSH, T4TOTAL, T3FREE, THYROIDAB in the last 72 hours.  Invalid input(s): FREET3 Anemia work up No results for input(s): VITAMINB12, FOLATE, FERRITIN, TIBC, IRON, RETICCTPCT in the last 72 hours. Urinalysis    Component Value Date/Time   COLORURINE YELLOW 05/16/2019 1024   APPEARANCEUR CLEAR 05/16/2019 1024   LABSPEC 1.020 05/16/2019 1024   PHURINE 5.5 05/16/2019 1024   GLUCOSEU NEGATIVE 05/16/2019 1024   HGBUR NEGATIVE 05/16/2019 1024   BILIRUBINUR NEGATIVE 05/16/2019 1024   KETONESUR NEGATIVE 05/16/2019 1024   PROTEINUR NEGATIVE 05/16/2019 1024   NITRITE NEGATIVE 05/16/2019 1024   LEUKOCYTESUR NEGATIVE 05/16/2019 1024   Sepsis Labs Invalid input(s): PROCALCITONIN,  WBC,  LACTICIDVEN Microbiology Recent Results (from the past 240 hour(s))  Culture, blood (routine x 2)     Status: None   Collection Time: 05/16/19 10:13 AM   Specimen: BLOOD  Result Value Ref Range Status   Specimen Description   Final    BLOOD RIGHT ANTECUBITAL Performed at [facility], [address] [place], [place] [address]    Special Requests   Final    BOTTLES DRAWN AEROBIC AND ANAEROBIC Blood Culture adequate volume Performed at [facility], [address] [place], [place] [address]    Culture   Final    NO GROWTH 5 DAYS Performed at [facility], [address] [place], [place] [address]    Report Status 05/21/2019 FINAL  Final  Culture, blood (routine x 2)     Status: None   Collection Time: 05/16/19 10:23 AM   Specimen: BLOOD  Result Value Ref Range Status   Specimen Description   Final    BLOOD LEFT ANTECUBITAL Performed at [facility], [address] [place], [place] [address]    Special Requests   Final    BOTTLES DRAWN AEROBIC AND ANAEROBIC Blood Culture adequate volume Performed at [facility], [address] [place], [place] [address]    Culture   Final    NO GROWTH 5 DAYS Performed at [facility], [address] [place], [place] [address]    Report Status 05/21/2019 FINAL  Final  Urine culture     Status: Abnormal   Collection Time: 05/16/19 10:24 AM   Specimen: Urine, Random  Result Value Ref Range Status   Specimen Description   Final    URINE, RANDOM Performed at [facility], [address] [place], [place] [address]    Special Requests   Final    NONE Performed at [facility], [address] [place], [place] [address]    Culture MULTIPLE SPECIES PRESENT, SUGGEST RECOLLECTION (A)  Final   Report Status 05/17/2019 FINAL  Final  Respiratory Panel by RT PCR (Flu A&B, Covid) - Nasopharyngeal Swab     Status: None   Collection Time: 05/16/19 12:01 PM   Specimen: Nasopharyngeal Swab  Result Value Ref Range Status   SARS Coronavirus 2 by RT PCR NEGATIVE NEGATIVE Final    Comment: (NOTE) SARS-CoV-2 target nucleic acids are NOT DETECTED. The SARS-CoV-2 RNA is generally detectable in upper respiratoy specimens during the acute phase of infection. The lowest concentration of SARS-CoV-2 viral copies this assay can detect is 131 copies/mL. A negative result does not preclude SARS-Cov-2 infection and should not be used as the sole basis for  treatment or other patient management decisions. A negative result may occur with  improper specimen collection/handling, submission of specimen other than nasopharyngeal swab, presence of viral mutation(s) within the areas targeted by this assay, and inadequate number of viral copies (<131 copies/mL). A negative result must be combined with clinical observations, patient history, and epidemiological information. The expected result is Negative. Fact Sheet for Patients:  PinkCheek.be Fact Sheet for Healthcare Providers:  GravelBags.it This test is not yet ap proved or cleared by the [place] FDA and  has been authorized for detection and/or diagnosis of SARS-CoV-2 by FDA under an Emergency Use Authorization (EUA). This EUA will remain  in effect (meaning this test can be used) for the duration of the COVID-19 declaration under Section 564(b)(1) of the Act, 21 U.S.C. section 360bbb-3(b)(1), unless the authorization is terminated or revoked sooner.    Influenza A by PCR NEGATIVE NEGATIVE Final   Influenza B by PCR NEGATIVE NEGATIVE Final    Comment: (NOTE) The Xpert Xpress SARS-CoV-2/FLU/RSV assay is intended as an aid in  the diagnosis of influenza from Nasopharyngeal swab specimens and  should not be used as a sole basis for treatment. Nasal washings and  aspirates are unacceptable for Xpert Xpress SARS-CoV-2/FLU/RSV  testing. Fact Sheet for Patients: PinkCheek.be Fact Sheet for Healthcare Providers: GravelBags.it This test is not yet approved or cleared by the [place] FDA and  has been authorized for detection and/or diagnosis of SARS-CoV-2 by  FDA under an Emergency Use Authorization (EUA). This EUA will remain  in effect (meaning this test can be used) for the duration of the  Covid-19 declaration under Section 564(b)(1) of the Act, 21  U.S.C. section  360bbb-3(b)(1), unless the authorization is  terminated or revoked. Performed at [facility], [address], [place], [place] [address]      Time coordinating discharge: 25  minutes  SIGNED: Antonieta Pert, MD  [facility] 05/24/2019, 1:40 PM  If 7PM-7AM, please contact night-coverage [email or web address]

## 2019-05-24 NOTE — Progress Notes (Signed)
Called received from Dr. Marin Olp and order given to continue plasmapheresis as an outpatient after discharge today.  Orders received for outpatient plasmapheresis x3 next week on MWF.  Discussed order with patient and she is in agreement with plan of care.  Patient agreed to present as outpatient on Monday, March 22 at 0700 for treatment.

## 2019-05-24 NOTE — Progress Notes (Signed)
Vicki [name] is doing quite well.  She really has had no complaints.  She had a new catheter put in for outpatient treatment.  Her platelet count is now 97,000.  Her LDH is down to 155.  Bilirubin is 0.6.  Her hemoglobin is 8.3.  This is little on the lower side.  I am not too worried about this as she is not symptomatic.  I do not think she needs to be transfused.  She has had no fever.  Her appetite is doing okay.  There is no nausea or vomiting.  She has had no bleeding.  She has had there was a little bleeding when she had the catheter put in yesterday.  She will have her plasma exchange today.  After today, I think that she can be discharged and have outpatient plasma exchange on Monday-Wednesday-Friday.  I think this would work well.  I will check her ADAMTS-13 level when we see her in the office next week.  She will come to the office on Thursday for Rituxan.  Her vital signs all look stable.  Her temperature is 98.7.  Pulse 88.  Blood pressure 128/66.  Head neck exam shows no scleral icterus.  There is no oral thrush.  She has no adenopathy in the neck.  Lungs are clear.  Cardiac exam regular rate and rhythm.  Abdomen is soft.  She has good bowel sounds.  There is no fluid wave.  Extremities shows no clubbing, cyanosis or edema.  Neurological exam shows no focal neurological deficits.  Skin exam shows no rashes, ecchymoses or petechia.  She still has the wart like lesion on a couple fingers on the right hand.  Again, Ms. Crotty has responded very well to plasma exchange.  I again I think that we can do this as an outpatient after today.  I will make sure that dialysis unit knows about the outpatient plasma exchange.  I do not think that she needs any new medications to be sent home on outside of the topical steroid cream for her fingers.  Again we will see her back in the office on Thursday for Rituxan.  I very much appreciate the outstanding care that she got from all the staff up  on [address].   Lattie Haw, MD  Hebrews 12:12

## 2019-05-25 LAB — THERAPEUTIC PLASMA EXCHANGE (BLOOD BANK)
Plasma Exchange: 2517
Plasma volume needed: 2522
Unit division: 0
Unit division: 0
Unit division: 0
Unit division: 0
Unit division: 0
Unit division: 0
Unit division: 0
Unit division: 0

## 2019-05-27 ENCOUNTER — Other Ambulatory Visit: Payer: Self-pay | Admitting: Hematology & Oncology

## 2019-05-27 ENCOUNTER — Non-Acute Institutional Stay (HOSPITAL_COMMUNITY)
Admission: AD | Admit: 2019-05-27 | Discharge: 2019-05-27 | Disposition: A | Discharge status: Home / Self Care | Payer: Federal, State, Local not specified - PPO | Source: Ambulatory Visit | Attending: Hematology & Oncology | Admitting: Hematology & Oncology

## 2019-05-27 ENCOUNTER — Telehealth: Payer: Self-pay | Admitting: Hematology & Oncology

## 2019-05-27 ENCOUNTER — Other Ambulatory Visit (HOSPITAL_COMMUNITY): Payer: Federal, State, Local not specified - PPO

## 2019-05-27 DIAGNOSIS — M3119 Other thrombotic microangiopathy: Secondary | ICD-10-CM

## 2019-05-27 DIAGNOSIS — M311 Thrombotic microangiopathy: Secondary | ICD-10-CM

## 2019-05-27 LAB — CBC
HCT: 26.5 % — ABNORMAL LOW (ref 36.0–46.0)
Hemoglobin: 8.7 g/dL — ABNORMAL LOW (ref 12.0–15.0)
MCH: 31.1 pg (ref 26.0–34.0)
MCHC: 32.8 g/dL (ref 30.0–36.0)
MCV: 94.6 fL (ref 80.0–100.0)
Platelets: 108 10*3/uL — ABNORMAL LOW (ref 150–400)
RBC: 2.8 MIL/uL — ABNORMAL LOW (ref 3.87–5.11)
RDW: 13.2 % (ref 11.5–15.5)
WBC: 1.7 10*3/uL — ABNORMAL LOW (ref 4.0–10.5)
nRBC: 0 % (ref 0.0–0.2)

## 2019-05-27 LAB — RENAL FUNCTION PANEL
Albumin: 3.1 g/dL — ABNORMAL LOW (ref 3.5–5.0)
Anion gap: 7 (ref 5–15)
BUN: <5 mg/dL — ABNORMAL LOW (ref 6–20)
CO2: 26 mmol/L (ref 22–32)
Calcium: 8.7 mg/dL — ABNORMAL LOW (ref 8.9–10.3)
Chloride: 106 mmol/L (ref 98–111)
Creatinine, Ser: 0.69 mg/dL (ref 0.44–1.00)
GFR calc Af Amer: >60 mL/min (ref >60)
GFR calc non Af Amer: >60 mL/min (ref >60)
Glucose, Bld: 217 mg/dL — ABNORMAL HIGH (ref 70–99)
Phosphorus: 2 mg/dL — ABNORMAL LOW (ref 2.5–4.6)
Potassium: 3.5 mmol/L (ref 3.5–5.1)
Sodium: 139 mmol/L (ref 135–145)

## 2019-05-27 MED ORDER — ACETAMINOPHEN 325 MG PO TABS
650.0000 mg | ORAL_TABLET | ORAL | Status: DC | PRN
  Administered 2019-05-27: 650 mg via ORAL

## 2019-05-27 MED ORDER — DIPHENHYDRAMINE HCL 25 MG PO CAPS
ORAL_CAPSULE | ORAL | Status: AC
  Filled 2019-05-27: qty 1

## 2019-05-27 MED ORDER — SODIUM CHLORIDE 0.9 % IV SOLN
2.0000 g | Freq: Once | INTRAVENOUS | Status: AC
  Administered 2019-05-27: 2 g via INTRAVENOUS
  Filled 2019-05-27: qty 20

## 2019-05-27 MED ORDER — DIPHENHYDRAMINE HCL 25 MG PO CAPS
25.0000 mg | ORAL_CAPSULE | Freq: Four times a day (QID) | ORAL | Status: DC | PRN
  Administered 2019-05-27: 25 mg via ORAL

## 2019-05-27 MED ORDER — ACD FORMULA A 0.73-2.45-2.2 GM/100ML VI SOLN
Status: AC
  Filled 2019-05-27: qty 500

## 2019-05-27 MED ORDER — CALCIUM CARBONATE ANTACID 500 MG PO CHEW
2.0000 | CHEWABLE_TABLET | ORAL | Status: DC
  Administered 2019-05-27: 400 mg via ORAL

## 2019-05-27 MED ORDER — ANTICOAGULANT SODIUM CITRATE 4% (200MG/5ML) IV SOLN
5.0000 mL | Freq: Once | Status: AC
  Administered 2019-05-27: 5 mL
  Filled 2019-05-27: qty 5

## 2019-05-27 MED ORDER — ACD FORMULA A 0.73-2.45-2.2 GM/100ML VI SOLN
1000.0000 mL | Status: DC
  Administered 2019-05-27: 1000 mL

## 2019-05-27 MED ORDER — CALCIUM CARBONATE ANTACID 500 MG PO CHEW
CHEWABLE_TABLET | ORAL | Status: AC
  Filled 2019-05-27: qty 2

## 2019-05-27 MED ORDER — ACETAMINOPHEN 325 MG PO TABS
ORAL_TABLET | ORAL | Status: AC
  Filled 2019-05-27: qty 2

## 2019-05-27 NOTE — Telephone Encounter (Signed)
Called and LMVM for patient regarding appointment date/time changes per  3/22 staff message

## 2019-05-27 NOTE — Progress Notes (Signed)
Tolerated treatment with no issues, alert and stable post pheresis, vss

## 2019-05-28 LAB — THERAPEUTIC PLASMA EXCHANGE (BLOOD BANK)
Plasma Exchange: 3000
Plasma volume needed: 3000
Unit division: 0
Unit division: 0
Unit division: 0
Unit division: 0
Unit division: 0
Unit division: 0
Unit division: 0
Unit division: 0
Unit division: 0

## 2019-05-29 ENCOUNTER — Inpatient Hospital Stay: Payer: Federal, State, Local not specified - PPO

## 2019-05-29 ENCOUNTER — Ambulatory Visit (HOSPITAL_COMMUNITY)
Admission: RE | Admit: 2019-05-29 | Discharge: 2019-05-29 | Disposition: A | Discharge status: Home / Self Care | Payer: Federal, State, Local not specified - PPO | Source: Ambulatory Visit | Attending: Hematology & Oncology | Admitting: Hematology & Oncology

## 2019-05-29 ENCOUNTER — Ambulatory Visit: Payer: Federal, State, Local not specified - PPO

## 2019-05-29 DIAGNOSIS — M311 Thrombotic microangiopathy: Secondary | ICD-10-CM | POA: Diagnosis not present

## 2019-05-29 LAB — POCT I-STAT, CHEM 8
BUN: 4 mg/dL — ABNORMAL LOW (ref 6–20)
Calcium, Ion: <0.3 mmol/L — CL (ref 1.15–1.40)
Chloride: 101 mmol/L (ref 98–111)
Creatinine, Ser: 0.6 mg/dL (ref 0.44–1.00)
Glucose, Bld: 249 mg/dL — ABNORMAL HIGH (ref 70–99)
HCT: 24 % — ABNORMAL LOW (ref 36.0–46.0)
Hemoglobin: 8.2 g/dL — ABNORMAL LOW (ref 12.0–15.0)
Potassium: 3.5 mmol/L (ref 3.5–5.1)
Sodium: 145 mmol/L (ref 135–145)
TCO2: 24 mmol/L (ref 22–32)

## 2019-05-29 MED ORDER — DIPHENHYDRAMINE HCL 25 MG PO CAPS
ORAL_CAPSULE | ORAL | Status: AC
  Administered 2019-05-29: 25 mg via ORAL
  Filled 2019-05-29: qty 1

## 2019-05-29 MED ORDER — CALCIUM CARBONATE ANTACID 500 MG PO CHEW
CHEWABLE_TABLET | ORAL | Status: AC
  Administered 2019-05-29: 400 mg via ORAL
  Filled 2019-05-29: qty 2

## 2019-05-29 MED ORDER — ACETAMINOPHEN 325 MG PO TABS: 650.0000 mg | ORAL_TABLET | ORAL | Status: DC | PRN

## 2019-05-29 MED ORDER — DIPHENHYDRAMINE HCL 25 MG PO CAPS: 25.0000 mg | ORAL_CAPSULE | Freq: Four times a day (QID) | ORAL | Status: DC | PRN

## 2019-05-29 MED ORDER — SODIUM CHLORIDE 0.9 % IV SOLN
2.0000 g | Freq: Once | INTRAVENOUS | Status: AC
  Administered 2019-05-29: 2 g via INTRAVENOUS
  Filled 2019-05-29: qty 20

## 2019-05-29 MED ORDER — ANTICOAGULANT SODIUM CITRATE 4% (200MG/5ML) IV SOLN
5.0000 mL | Freq: Once | Status: AC
  Administered 2019-05-29: 5 mL
  Filled 2019-05-29: qty 5

## 2019-05-29 MED ORDER — ACETAMINOPHEN 325 MG PO TABS
ORAL_TABLET | ORAL | Status: AC
  Filled 2019-05-29: qty 2

## 2019-05-29 MED ORDER — DIPHENHYDRAMINE HCL 25 MG PO CAPS
ORAL_CAPSULE | ORAL | Status: AC
  Filled 2019-05-29: qty 1

## 2019-05-29 MED ORDER — CALCIUM CARBONATE ANTACID 500 MG PO CHEW: 2.0000 | CHEWABLE_TABLET | ORAL | Status: DC

## 2019-05-29 MED ORDER — ACD FORMULA A 0.73-2.45-2.2 GM/100ML VI SOLN
1000.0000 mL | Status: DC
  Administered 2019-05-29: 1000 mL

## 2019-05-29 NOTE — Progress Notes (Unsigned)
Pharmacist Chemotherapy Monitoring - Follow Up Assessment    I verify that I have reviewed each item in the below checklist:  . Regimen for the patient is scheduled for the appropriate day and plan matches scheduled date. [name] Appropriate non-routine labs are ordered dependent on drug ordered. . If applicable, additional medications reviewed and ordered per protocol based on lifetime cumulative doses and/or treatment regimen.   Plan for follow-up and/or issues identified: Yes . I-vent associated with next due treatment: Yes . MD and/or nursing notified: No  Lamija Besse, Jacqlyn Larsen 05/29/2019 8:14 AM

## 2019-05-29 NOTE — Progress Notes (Signed)
Therapeutic Plasma Exchange complete. Tolerated well. Patient departed unit to home via wheelchair alert oriented vitals stable w/o complaint.

## 2019-05-30 ENCOUNTER — Inpatient Hospital Stay: Payer: Federal, State, Local not specified - PPO

## 2019-05-30 ENCOUNTER — Encounter: Payer: Self-pay | Admitting: Hematology & Oncology

## 2019-05-30 ENCOUNTER — Other Ambulatory Visit: Payer: Self-pay

## 2019-05-30 ENCOUNTER — Inpatient Hospital Stay (HOSPITAL_BASED_OUTPATIENT_CLINIC_OR_DEPARTMENT_OTHER): Payer: Federal, State, Local not specified - PPO | Admitting: Hematology & Oncology

## 2019-05-30 VITALS — BP 131/71 | HR 97 | Temp 98.7°F | Resp 18

## 2019-05-30 VITALS — BP 144/74 | HR 97 | Temp 97.5°F | Resp 18 | Ht 61.0 in | Wt 156.0 lb

## 2019-05-30 DIAGNOSIS — K7460 Unspecified cirrhosis of liver: Secondary | ICD-10-CM | POA: Diagnosis not present

## 2019-05-30 DIAGNOSIS — M3119 Other thrombotic microangiopathy: Secondary | ICD-10-CM

## 2019-05-30 DIAGNOSIS — R233 Spontaneous ecchymoses: Secondary | ICD-10-CM | POA: Diagnosis not present

## 2019-05-30 DIAGNOSIS — R600 Localized edema: Secondary | ICD-10-CM | POA: Diagnosis not present

## 2019-05-30 DIAGNOSIS — R5383 Other fatigue: Secondary | ICD-10-CM | POA: Diagnosis not present

## 2019-05-30 DIAGNOSIS — K7581 Nonalcoholic steatohepatitis (NASH): Secondary | ICD-10-CM | POA: Diagnosis not present

## 2019-05-30 DIAGNOSIS — E119 Type 2 diabetes mellitus without complications: Secondary | ICD-10-CM | POA: Diagnosis not present

## 2019-05-30 DIAGNOSIS — Z79899 Other long term (current) drug therapy: Secondary | ICD-10-CM | POA: Diagnosis not present

## 2019-05-30 DIAGNOSIS — M311 Thrombotic microangiopathy: Secondary | ICD-10-CM | POA: Diagnosis not present

## 2019-05-30 DIAGNOSIS — Z5112 Encounter for antineoplastic immunotherapy: Secondary | ICD-10-CM | POA: Diagnosis not present

## 2019-05-30 DIAGNOSIS — R202 Paresthesia of skin: Secondary | ICD-10-CM | POA: Diagnosis not present

## 2019-05-30 DIAGNOSIS — D72819 Decreased white blood cell count, unspecified: Secondary | ICD-10-CM | POA: Diagnosis not present

## 2019-05-30 DIAGNOSIS — Z7984 Long term (current) use of oral hypoglycemic drugs: Secondary | ICD-10-CM | POA: Diagnosis not present

## 2019-05-30 DIAGNOSIS — R200 Anesthesia of skin: Secondary | ICD-10-CM | POA: Diagnosis not present

## 2019-05-30 LAB — IRON AND TIBC
Iron: 107 ug/dL (ref 41–142)
Saturation Ratios: 37 % (ref 21–57)
TIBC: 285 ug/dL (ref 236–444)
UIBC: 178 ug/dL (ref 120–384)

## 2019-05-30 LAB — CMP (CANCER CENTER ONLY)
ALT: 21 U/L (ref 0–44)
AST: 32 U/L (ref 15–41)
Albumin: 3.6 g/dL (ref 3.5–5.0)
Alkaline Phosphatase: 84 U/L (ref 38–126)
Anion gap: 4 — ABNORMAL LOW (ref 5–15)
BUN: 9 mg/dL (ref 6–20)
CO2: 30 mmol/L (ref 22–32)
Calcium: 9.1 mg/dL (ref 8.9–10.3)
Chloride: 106 mmol/L (ref 98–111)
Creatinine: 0.86 mg/dL (ref 0.44–1.00)
GFR, Est AFR Am: >60 mL/min (ref >60)
GFR, Estimated: >60 mL/min (ref >60)
Glucose, Bld: 214 mg/dL — ABNORMAL HIGH (ref 70–99)
Potassium: 3.6 mmol/L (ref 3.5–5.1)
Sodium: 140 mmol/L (ref 135–145)
Total Bilirubin: 0.4 mg/dL (ref 0.3–1.2)
Total Protein: 6.1 g/dL — ABNORMAL LOW (ref 6.5–8.1)

## 2019-05-30 LAB — CBC WITH DIFFERENTIAL (CANCER CENTER ONLY)
Abs Immature Granulocytes: 0 10*3/uL (ref 0.00–0.07)
Basophils Absolute: 0 10*3/uL (ref 0.0–0.1)
Basophils Relative: 0 %
Eosinophils Absolute: 0.2 10*3/uL (ref 0.0–0.5)
Eosinophils Relative: 8 %
HCT: 27.8 % — ABNORMAL LOW (ref 36.0–46.0)
Hemoglobin: 9.3 g/dL — ABNORMAL LOW (ref 12.0–15.0)
Immature Granulocytes: 0 %
Lymphocytes Relative: 19 %
Lymphs Abs: 0.4 10*3/uL — ABNORMAL LOW (ref 0.7–4.0)
MCH: 31.6 pg (ref 26.0–34.0)
MCHC: 33.5 g/dL (ref 30.0–36.0)
MCV: 94.6 fL (ref 80.0–100.0)
Monocytes Absolute: 0.2 10*3/uL (ref 0.1–1.0)
Monocytes Relative: 12 %
Neutro Abs: 1.2 10*3/uL — ABNORMAL LOW (ref 1.7–7.7)
Neutrophils Relative %: 61 %
Platelet Count: 115 10*3/uL — ABNORMAL LOW (ref 150–400)
RBC: 2.94 MIL/uL — ABNORMAL LOW (ref 3.87–5.11)
RDW: 13.1 % (ref 11.5–15.5)
WBC Count: 1.9 10*3/uL — ABNORMAL LOW (ref 4.0–10.5)
nRBC: 0 % (ref 0.0–0.2)

## 2019-05-30 LAB — THERAPEUTIC PLASMA EXCHANGE (BLOOD BANK)
Plasma Exchange: 2901
Plasma volume needed: 2900
Unit division: 0
Unit division: 0
Unit division: 0
Unit division: 0
Unit division: 0

## 2019-05-30 LAB — RETICULOCYTES
Immature Retic Fract: 16.4 % — ABNORMAL HIGH (ref 2.3–15.9)
RBC.: 3.02 MIL/uL — ABNORMAL LOW (ref 3.87–5.11)
Retic Count, Absolute: 65.2 10*3/uL (ref 19.0–186.0)
Retic Ct Pct: 2.2 % (ref 0.4–3.1)

## 2019-05-30 LAB — SAVE SMEAR(SSMR), FOR PROVIDER SLIDE REVIEW

## 2019-05-30 LAB — HEPATITIS B CORE ANTIBODY, TOTAL: Hep B Core Total Ab: NONREACTIVE

## 2019-05-30 LAB — FERRITIN: Ferritin: 212 ng/mL (ref 11–307)

## 2019-05-30 LAB — MAGNESIUM: Magnesium: 1.9 mg/dL (ref 1.7–2.4)

## 2019-05-30 LAB — LACTATE DEHYDROGENASE: LDH: 228 U/L — ABNORMAL HIGH (ref 98–192)

## 2019-05-30 LAB — HEPATITIS B SURFACE ANTIGEN: Hepatitis B Surface Ag: NONREACTIVE

## 2019-05-30 MED ORDER — METHYLPREDNISOLONE SODIUM SUCC 125 MG IJ SOLR
INTRAMUSCULAR | Status: AC
  Filled 2019-05-30: qty 2

## 2019-05-30 MED ORDER — FAMOTIDINE IN NACL 20-0.9 MG/50ML-% IV SOLN
20.0000 mg | Freq: Once | INTRAVENOUS | Status: AC
  Administered 2019-05-30: 20 mg via INTRAVENOUS

## 2019-05-30 MED ORDER — METHYLPREDNISOLONE SODIUM SUCC 125 MG IJ SOLR
125.0000 mg | Freq: Once | INTRAMUSCULAR | Status: AC | PRN
  Administered 2019-05-30: 80 mg via INTRAVENOUS

## 2019-05-30 MED ORDER — ACETAMINOPHEN 325 MG PO TABS
ORAL_TABLET | ORAL | Status: AC
  Filled 2019-05-30: qty 2

## 2019-05-30 MED ORDER — DIPHENHYDRAMINE HCL 25 MG PO CAPS
ORAL_CAPSULE | ORAL | Status: AC
  Filled 2019-05-30: qty 2

## 2019-05-30 MED ORDER — ACETAMINOPHEN 325 MG PO TABS
650.0000 mg | ORAL_TABLET | Freq: Once | ORAL | Status: AC
  Administered 2019-05-30: 650 mg via ORAL

## 2019-05-30 MED ORDER — LORAZEPAM 2 MG/ML IJ SOLN
0.5000 mg | Freq: Once | INTRAMUSCULAR | Status: AC
  Administered 2019-05-30: 0.5 mg via INTRAVENOUS

## 2019-05-30 MED ORDER — FAMOTIDINE IN NACL 20-0.9 MG/50ML-% IV SOLN
INTRAVENOUS | Status: AC
  Filled 2019-05-30: qty 50

## 2019-05-30 MED ORDER — DIPHENHYDRAMINE HCL 50 MG/ML IJ SOLN
50.0000 mg | Freq: Once | INTRAMUSCULAR | Status: AC | PRN
  Administered 2019-05-30: 50 mg via INTRAVENOUS

## 2019-05-30 MED ORDER — LORAZEPAM 2 MG/ML IJ SOLN
INTRAMUSCULAR | Status: AC
  Filled 2019-05-30: qty 1

## 2019-05-30 MED ORDER — FAMOTIDINE IN NACL 20-0.9 MG/50ML-% IV SOLN
20.0000 mg | Freq: Once | INTRAVENOUS | Status: AC | PRN
  Administered 2019-05-30: 20 mg via INTRAVENOUS

## 2019-05-30 MED ORDER — DIPHENHYDRAMINE HCL 25 MG PO CAPS
50.0000 mg | ORAL_CAPSULE | Freq: Once | ORAL | Status: AC
  Administered 2019-05-30: 50 mg via ORAL

## 2019-05-30 MED ORDER — SODIUM CHLORIDE 0.9 % IV SOLN
Freq: Once | INTRAVENOUS | Status: AC
  Filled 2019-05-30: qty 250

## 2019-05-30 MED ORDER — SODIUM CHLORIDE 0.9 % IV SOLN
375.0000 mg/m2 | Freq: Once | INTRAVENOUS | Status: AC
  Administered 2019-05-30: 600 mg via INTRAVENOUS
  Filled 2019-05-30: qty 50

## 2019-05-30 NOTE — Patient Instructions (Signed)
Rituximab injection What is this medicine? RITUXIMAB (ri TUX i mab) is a monoclonal antibody. It is used to treat certain types of cancer like non-Hodgkin lymphoma and chronic lymphocytic leukemia. It is also used to treat rheumatoid arthritis, granulomatosis with polyangiitis (or Wegener's granulomatosis), microscopic polyangiitis, and pemphigus vulgaris. This medicine may be used for other purposes; ask your health care provider or pharmacist if you have questions. COMMON BRAND NAME(S): Rituxan, RUXIENCE What should I tell my health care provider before I take this medicine? They need to know if you have any of these conditions:  heart disease  infection (especially a virus infection such as hepatitis B, chickenpox, cold sores, or herpes)  immune system problems  irregular heartbeat  kidney disease  low blood counts, like low white cell, platelet, or red cell counts  lung or breathing disease, like asthma  recently received or scheduled to receive a vaccine  an unusual or allergic reaction to rituximab, other medicines, foods, dyes, or preservatives  pregnant or trying to get pregnant  breast-feeding How should I use this medicine? This medicine is for infusion into a vein. It is administered in a hospital or clinic by a specially trained health care professional. A special MedGuide will be given to you by the pharmacist with each prescription and refill. Be sure to read this information carefully each time. Talk to your pediatrician regarding the use of this medicine in children. This medicine is not approved for use in children. Overdosage: If you think you have taken too much of this medicine contact a poison control center or emergency room at once. NOTE: This medicine is only for you. Do not share this medicine with others. What if I miss a dose? It is important not to miss a dose. Call your doctor or health care professional if you are unable to keep an appointment. What  may interact with this medicine?  cisplatin  live virus vaccines This list may not describe all possible interactions. Give your health care provider a list of all the medicines, herbs, non-prescription drugs, or dietary supplements you use. Also tell them if you smoke, drink alcohol, or use illegal drugs. Some items may interact with your medicine. What should I watch for while using this medicine? Your condition will be monitored carefully while you are receiving this medicine. You may need blood work done while you are taking this medicine. This medicine can cause serious allergic reactions. To reduce your risk you may need to take medicine before treatment with this medicine. Take your medicine as directed. In some patients, this medicine may cause a serious brain infection that may cause death. If you have any problems seeing, thinking, speaking, walking, or standing, tell your healthcare professional right away. If you cannot reach your healthcare professional, urgently seek other source of medical care. Call your doctor or health care professional for advice if you get a fever, chills or sore throat, or other symptoms of a cold or flu. Do not treat yourself. This drug decreases your body's ability to fight infections. Try to avoid being around people who are sick. Do not become pregnant while taking this medicine or for at least 12 months after stopping it. Women should inform their doctor if they wish to become pregnant or think they might be pregnant. There is a potential for serious side effects to an unborn child. Talk to your health care professional or pharmacist for more information. Do not breast-feed an infant while taking this medicine or for at   least 6 months after stopping it. What side effects may I notice from receiving this medicine? Side effects that you should report to your doctor or health care professional as soon as possible:  allergic reactions like skin rash, itching or  hives; swelling of the face, lips, or tongue  breathing problems  chest pain  changes in vision  diarrhea  headache with fever, neck stiffness, sensitivity to light, nausea, or confusion  fast, irregular heartbeat  loss of memory  low blood counts - this medicine may decrease the number of white blood cells, red blood cells and platelets. You may be at increased risk for infections and bleeding.  mouth sores  problems with balance, talking, or walking  redness, blistering, peeling or loosening of the skin, including inside the mouth  signs of infection - fever or chills, cough, sore throat, pain or difficulty passing urine  signs and symptoms of kidney injury like trouble passing urine or change in the amount of urine  signs and symptoms of liver injury like dark yellow or brown urine; general ill feeling or flu-like symptoms; light-colored stools; loss of appetite; nausea; right upper belly pain; unusually weak or tired; yellowing of the eyes or skin  signs and symptoms of low blood pressure like dizziness; feeling faint or lightheaded, falls; unusually weak or tired  stomach pain  swelling of the ankles, feet, hands  unusual bleeding or bruising  vomiting Side effects that usually do not require medical attention (report to your doctor or health care professional if they continue or are bothersome):  headache  joint pain  muscle cramps or muscle pain  nausea  tiredness This list may not describe all possible side effects. Call your doctor for medical advice about side effects. You may report side effects to FDA at 1-800-FDA-1088. Where should I keep my medicine? This drug is given in a hospital or clinic and will not be stored at home. NOTE: This sheet is a summary. It may not cover all possible information. If you have questions about this medicine, talk to your doctor, pharmacist, or health care provider.  2020 Elsevier/Gold Standard (2018-04-04  22:01:36)  

## 2019-05-30 NOTE — Progress Notes (Signed)
Hematology and Oncology Follow Up Visit  Robecca Fulgham [id number] Mar 17, 1960 59 y.o. 05/30/2019   Principle Diagnosis:   TTP - acquired -- relapsed  NASH -- leukopenia/thrombocytopenia  Current Therapy:    Plasma Exchange - M- W-F --   Rituxan 375 mg/m2 IV weekly -- s/p cycle #2     Interim History:  Ms. Ibe is back for her follow-up.  She looks fantastic.  We last saw her here couple weeks ago, her platelet count was 11.  It was apparent that she had relapsed with her TTP.  She was admitted.  She restarted her plasma exchange.  She has done incredibly well.  After 1 week of plasma exchange, her platelet count went up to 75,000.  We now have her on every other day plasma exchange.  She gets her Rituxan weekly.  She had her second cycle of Rituxan in the hospital.  Her LDH has come down quite nicely.  Her reticulocyte count has improved.  She has had no problems with bleeding.  There is no diarrhea.  She has some fatigue.  She has occasional swelling in her lower legs.  She has the dialysis catheter in her right upper chest wall.  She is doing well with this.  She has had no problems with fever.  There is no cough.  She has had no shortness of breath.  Overall, her performance status is ECOG 1.   Medications:  Current Outpatient Medications:  .  Alum Hydroxide-Mag Carbonate (GAVISCON EXTRA STRENGTH PO), Take 1 tablet by mouth 4 (four) times daily as needed., Disp: , Rfl:  .  calcium carbonate (TUMS - DOSED IN MG ELEMENTAL CALCIUM) 500 MG chewable tablet, Chew 2 tablets (400 mg of elemental calcium total) by mouth every 3 (three) hours. (Patient taking differently: Chew 2 tablets by mouth daily as needed for indigestion. ), Disp: 30 tablet, Rfl: 0 .  cholecalciferol (VITAMIN D3) 25 MCG (1000 UT) tablet, Take 1,000 Units by mouth daily., Disp: , Rfl:  .  dextromethorphan-guaiFENesin (MUCINEX DM) 30-600 MG 12hr tablet, Take 2 tablets by mouth 2 (two) times daily as  needed for cough., Disp: , Rfl:  .  diphenhydrAMINE (BENADRYL) 50 MG capsule, Take 1 capsule (50 mg total) by mouth daily as needed (on call for plasma exchange)., Disp: 30 capsule, Rfl: 0 .  Fexofenadine-Pseudoephedrine (ALLEGRA-D 24 HOUR PO), Take by mouth., Disp: , Rfl:  .  folic acid (FOLVITE) 1 MG tablet, Take 2 tablets (2 mg total) by mouth daily., Disp: 60 tablet, Rfl: 12 .  furosemide (LASIX) 40 MG tablet, Take 1 tablet (40 mg total) by mouth daily., Disp: 30 tablet, Rfl: 1 .  glucose blood test strip, One touch ultra, Disp: , Rfl:  .  Multiple Vitamins-Minerals (MULTI FOR HER 50+) TABS, Take 1 tablet by mouth daily., Disp: , Rfl:  .  potassium chloride (KLOR-CON) 10 MEQ tablet, Take 1 tablet (10 mEq total) by mouth daily., Disp: 30 tablet, Rfl: 1 .  senna-docusate (SENOKOT-S) 8.6-50 MG tablet, Take 2 tablets by mouth 2 (two) times daily., Disp: 20 tablet, Rfl: 0 .  sitaGLIPtin (JANUVIA) 100 MG tablet, Take 1 tablet (100 mg total) by mouth daily., Disp: 90 tablet, Rfl: 0  Allergies:  Allergies  Allergen Reactions  . Metformin And Related Itching    Heart palpitation  . Sulfa Antibiotics Itching  . Sulfasalazine Itching    Past Medical History, Surgical history, Social history, and Family History were reviewed and updated.  Review of Systems:  Review of Systems  Constitutional: Negative.   HENT:  Negative.   Eyes: Negative.   Respiratory: Negative.   Cardiovascular: Positive for leg swelling.  Gastrointestinal: Negative.   Endocrine: Negative.   Genitourinary: Negative.    Musculoskeletal: Positive for arthralgias and myalgias.  Skin: Negative.   Neurological: Negative.   Hematological: Negative.   Psychiatric/Behavioral: Negative.     Physical Exam:  height is 5' 1"  (1.549 m) and weight is 156 lb (70.8 kg). Her temporal temperature is 97.5 F (36.4 C) (abnormal). Her blood pressure is 144/74 (abnormal) and her pulse is 97. Her respiration is 18 and oxygen saturation is  89% (abnormal).   Wt Readings from Last 3 Encounters:  05/30/19 156 lb (70.8 kg)  05/23/19 167 lb 1.7 oz (75.8 kg)  05/08/19 152 lb 12.8 oz (69.3 kg)    Physical Exam Vitals reviewed.  HENT:     Head: Normocephalic and atraumatic.  Eyes:     Pupils: Pupils are equal, round, and reactive to light.  Cardiovascular:     Rate and Rhythm: Normal rate and regular rhythm.     Heart sounds: Normal heart sounds.  Pulmonary:     Effort: Pulmonary effort is normal.     Breath sounds: Normal breath sounds.  Abdominal:     General: Bowel sounds are normal.     Palpations: Abdomen is soft.  Musculoskeletal:        General: No tenderness or deformity. Normal range of motion.     Cervical back: Normal range of motion.     Comments: Extremities shows some 1+ edema in her lower legs.  She has good range of motion of her joints.  She has good strength in upper and lower extremities.  Lymphadenopathy:     Cervical: No cervical adenopathy.  Skin:    General: Skin is warm and dry.     Findings: No erythema or rash.  Neurological:     Mental Status: She is alert and oriented to person, place, and time.  Psychiatric:        Behavior: Behavior normal.        Thought Content: Thought content normal.        Judgment: Judgment normal.      Lab Results  Component Value Date   WBC 1.9 (L) 05/30/2019   HGB 9.3 (L) 05/30/2019   HCT 27.8 (L) 05/30/2019   MCV 94.6 05/30/2019   PLT 115 (L) 05/30/2019     Chemistry      Component Value Date/Time   NA 140 05/30/2019 0832   K 3.6 05/30/2019 0832   CL 106 05/30/2019 0832   CO2 30 05/30/2019 0832   BUN 9 05/30/2019 0832   CREATININE 0.86 05/30/2019 0832   CREATININE 0.70 09/25/2015 1504      Component Value Date/Time   CALCIUM 9.1 05/30/2019 0832   ALKPHOS 84 05/30/2019 0832   AST 32 05/30/2019 0832   ALT 21 05/30/2019 0832   BILITOT 0.4 05/30/2019 0832       Impression and Plan: Ms. Clendenin is a 59 year old African-American female.   She has acquired TTP.  She has relapsed.  She is responding incredibly well.  Her platelet count today is 115,000.  We will see what her LDH is.  She will continue her weekly Rituxan.  I will not change her every other day plasma exchange.  She will have this 1 more week and then if her platelet count remains stable, then we will see about doing  plasma exchange Monday and Thursday.  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  Again I am sure this is probably from her NASH cirrhosis.  I would like to see her back in 1 week.   Volanda Napoleon, MD 3/25/20219:45 AM

## 2019-05-30 NOTE — Progress Notes (Unsigned)
1155 -  Patient reports sudden onset back pain with Rituxan infusing. Infusion stopped. IVF flowing to gravity. Emergency medicines given per protocol with additional orders from Dr Marin Olp. Vitals WNL as noted.   1210 - Sx resolved. To rechallenge Rituxan in 30 minutes.  dph

## 2019-05-30 NOTE — Addendum Note (Signed)
Addended by: Volanda Napoleon on: 05/30/2019 09:53 AM   Modules accepted: Orders

## 2019-05-30 NOTE — Addendum Note (Signed)
Addended by: Burney Gauze R on: 05/30/2019 01:22 PM   Modules accepted: Orders

## 2019-05-30 NOTE — Progress Notes (Unsigned)
Ok to treat with low WBC/ANC per Dr Marin Olp. dph

## 2019-05-31 ENCOUNTER — Other Ambulatory Visit: Payer: Self-pay | Admitting: Hematology & Oncology

## 2019-05-31 ENCOUNTER — Non-Acute Institutional Stay (HOSPITAL_COMMUNITY)
Admission: RE | Admit: 2019-05-31 | Discharge: 2019-05-31 | Disposition: A | Discharge status: Home / Self Care | Payer: Federal, State, Local not specified - PPO | Source: Ambulatory Visit | Attending: Hematology & Oncology | Admitting: Hematology & Oncology

## 2019-05-31 DIAGNOSIS — M311 Thrombotic microangiopathy: Secondary | ICD-10-CM | POA: Diagnosis not present

## 2019-05-31 LAB — BASIC METABOLIC PANEL
Anion gap: 10 (ref 5–15)
BUN: 7 mg/dL (ref 6–20)
CO2: 21 mmol/L — ABNORMAL LOW (ref 22–32)
Calcium: 9.3 mg/dL (ref 8.9–10.3)
Chloride: 107 mmol/L (ref 98–111)
Creatinine, Ser: 0.71 mg/dL (ref 0.44–1.00)
GFR calc Af Amer: >60 mL/min (ref >60)
GFR calc non Af Amer: >60 mL/min (ref >60)
Glucose, Bld: 312 mg/dL — ABNORMAL HIGH (ref 70–99)
Potassium: 3.9 mmol/L (ref 3.5–5.1)
Sodium: 138 mmol/L (ref 135–145)

## 2019-05-31 LAB — CBC
HCT: 27.4 % — ABNORMAL LOW (ref 36.0–46.0)
Hemoglobin: 9 g/dL — ABNORMAL LOW (ref 12.0–15.0)
MCH: 31.3 pg (ref 26.0–34.0)
MCHC: 32.8 g/dL (ref 30.0–36.0)
MCV: 95.1 fL (ref 80.0–100.0)
Platelets: 92 K/uL — ABNORMAL LOW (ref 150–400)
RBC: 2.88 MIL/uL — ABNORMAL LOW (ref 3.87–5.11)
RDW: 13.2 % (ref 11.5–15.5)
WBC: 4.3 K/uL (ref 4.0–10.5)
nRBC: 0 % (ref 0.0–0.2)

## 2019-05-31 LAB — ADAMTS13 ACTIVITY: Adamts 13 Activity: 80.2 % (ref >66.8)

## 2019-05-31 LAB — ADAMTS13 ACTIVITY REFLEX

## 2019-05-31 MED ORDER — DIPHENHYDRAMINE HCL 25 MG PO CAPS
ORAL_CAPSULE | ORAL | Status: AC
  Filled 2019-05-31: qty 1

## 2019-05-31 MED ORDER — CALCIUM CARBONATE ANTACID 500 MG PO CHEW
CHEWABLE_TABLET | ORAL | Status: AC
  Filled 2019-05-31: qty 2

## 2019-05-31 MED ORDER — CALCIUM CARBONATE ANTACID 500 MG PO CHEW
2.0000 | CHEWABLE_TABLET | ORAL | Status: DC
  Administered 2019-05-31: 400 mg via ORAL

## 2019-05-31 MED ORDER — DIPHENHYDRAMINE HCL 25 MG PO CAPS
25.0000 mg | ORAL_CAPSULE | Freq: Four times a day (QID) | ORAL | Status: DC | PRN
  Administered 2019-05-31: 25 mg via ORAL

## 2019-05-31 MED ORDER — CALCIUM GLUCONATE-NACL 2-0.675 GM/100ML-% IV SOLN
2.0000 g | INTRAVENOUS | Status: AC
  Administered 2019-05-31: 2000 mg via INTRAVENOUS
  Filled 2019-05-31: qty 100

## 2019-05-31 MED ORDER — ACETAMINOPHEN 325 MG PO TABS: 650.0000 mg | ORAL_TABLET | ORAL | Status: DC | PRN

## 2019-05-31 MED ORDER — ACD FORMULA A 0.73-2.45-2.2 GM/100ML VI SOLN
1000.0000 mL | Status: DC
  Administered 2019-05-31: 1000 mL

## 2019-05-31 MED ORDER — ANTICOAGULANT SODIUM CITRATE 4% (200MG/5ML) IV SOLN
5.0000 mL | Status: AC
  Administered 2019-05-31: 5 mL
  Filled 2019-05-31: qty 5

## 2019-05-31 NOTE — Progress Notes (Signed)
Therapeutic Plasma Exchange complete. Tolerated well. Patient discharged unit to home via wheelchair alert oriented vitals stable w/o complaint.

## 2019-06-01 LAB — THERAPEUTIC PLASMA EXCHANGE (BLOOD BANK)
Plasma Exchange: 2750
Plasma volume needed: 2750
Unit division: 0
Unit division: 0
Unit division: 0
Unit division: 0
Unit division: 0
Unit division: 0
Unit division: 0
Unit division: 0
Unit division: 0

## 2019-06-03 ENCOUNTER — Non-Acute Institutional Stay (HOSPITAL_COMMUNITY)
Admission: AD | Admit: 2019-06-03 | Discharge: 2019-06-03 | Disposition: A | Discharge status: Home / Self Care | Payer: Federal, State, Local not specified - PPO | Source: Ambulatory Visit | Attending: Hematology & Oncology | Admitting: Hematology & Oncology

## 2019-06-03 ENCOUNTER — Telehealth: Payer: Self-pay | Admitting: Family Medicine

## 2019-06-03 DIAGNOSIS — M311 Thrombotic microangiopathy: Secondary | ICD-10-CM | POA: Diagnosis not present

## 2019-06-03 DIAGNOSIS — E119 Type 2 diabetes mellitus without complications: Secondary | ICD-10-CM

## 2019-06-03 LAB — BASIC METABOLIC PANEL
Anion gap: 9 (ref 5–15)
BUN: 7 mg/dL (ref 6–20)
CO2: 25 mmol/L (ref 22–32)
Calcium: 9 mg/dL (ref 8.9–10.3)
Chloride: 104 mmol/L (ref 98–111)
Creatinine, Ser: 0.67 mg/dL (ref 0.44–1.00)
GFR calc Af Amer: >60 mL/min (ref >60)
GFR calc non Af Amer: >60 mL/min (ref >60)
Glucose, Bld: 312 mg/dL — ABNORMAL HIGH (ref 70–99)
Potassium: 3.6 mmol/L (ref 3.5–5.1)
Sodium: 138 mmol/L (ref 135–145)

## 2019-06-03 LAB — CBC
HCT: 28.1 % — ABNORMAL LOW (ref 36.0–46.0)
Hemoglobin: 9.4 g/dL — ABNORMAL LOW (ref 12.0–15.0)
MCH: 31.5 pg (ref 26.0–34.0)
MCHC: 33.5 g/dL (ref 30.0–36.0)
MCV: 94.3 fL (ref 80.0–100.0)
Platelets: 73 10*3/uL — ABNORMAL LOW (ref 150–400)
RBC: 2.98 MIL/uL — ABNORMAL LOW (ref 3.87–5.11)
RDW: 13.3 % (ref 11.5–15.5)
WBC: 1.7 10*3/uL — ABNORMAL LOW (ref 4.0–10.5)
nRBC: 0 % (ref 0.0–0.2)

## 2019-06-03 MED ORDER — CALCIUM GLUCONATE-NACL 2-0.675 GM/100ML-% IV SOLN
2.0000 g | Freq: Once | INTRAVENOUS | Status: AC
  Administered 2019-06-03: 2000 mg via INTRAVENOUS
  Filled 2019-06-03: qty 100

## 2019-06-03 MED ORDER — CALCIUM CARBONATE ANTACID 500 MG PO CHEW
2.0000 | CHEWABLE_TABLET | ORAL | Status: AC
  Administered 2019-06-03: 400 mg via ORAL

## 2019-06-03 MED ORDER — ALBUMIN HUMAN 25 % IV SOLN: 25.0000 g | Freq: Once | INTRAVENOUS | Status: DC

## 2019-06-03 MED ORDER — ACETAMINOPHEN 325 MG PO TABS: 650.0000 mg | ORAL_TABLET | ORAL | Status: DC | PRN

## 2019-06-03 MED ORDER — DIPHENHYDRAMINE HCL 25 MG PO CAPS
ORAL_CAPSULE | ORAL | Status: AC
  Administered 2019-06-03: 25 mg via ORAL
  Filled 2019-06-03: qty 1

## 2019-06-03 MED ORDER — ANTICOAGULANT SODIUM CITRATE 4% (200MG/5ML) IV SOLN
5.0000 mL | Freq: Once | Status: AC
  Administered 2019-06-03: 5 mL
  Filled 2019-06-03: qty 5

## 2019-06-03 MED ORDER — ACD FORMULA A 0.73-2.45-2.2 GM/100ML VI SOLN
Status: AC
  Administered 2019-06-03: 1000 mL
  Filled 2019-06-03: qty 500

## 2019-06-03 MED ORDER — CALCIUM CARBONATE ANTACID 500 MG PO CHEW
CHEWABLE_TABLET | ORAL | Status: AC
  Administered 2019-06-03: 400 mg via ORAL
  Filled 2019-06-03: qty 4

## 2019-06-03 MED ORDER — ACD FORMULA A 0.73-2.45-2.2 GM/100ML VI SOLN: 1000.0000 mL | Status: DC

## 2019-06-03 MED ORDER — DIPHENHYDRAMINE HCL 25 MG PO CAPS: 25.0000 mg | ORAL_CAPSULE | Freq: Four times a day (QID) | ORAL | Status: DC | PRN

## 2019-06-03 NOTE — Telephone Encounter (Signed)
Pt advised of Dr. Verda Cumins comments and recommendations. Pt said given her treatments it's really hard for her to schedule a nutritionist appt. Pt said that PCP has already put a referral in for DM nutritionist earlier this month and they called her but she is getting treatments so often she couldn't schedule an appt with them. Pt said if you put order back in she will attempt to make an appt but she can't guarantee that she will be able to go

## 2019-06-03 NOTE — Telephone Encounter (Signed)
Would recommend she work on diet changes.   I can place a referral for the diabetes education center so she can meet with a nutritionist.   That seems improved from before starting the medication.

## 2019-06-03 NOTE — Progress Notes (Signed)
Pt tolerated tx well; was accompanied to the main entrance by the transporter; denies pain, dizziness, n/v, or weakness.

## 2019-06-03 NOTE — Telephone Encounter (Signed)
Placed a new referral. I wonder if Virtual visits would be an option? Could she do a virtual visit during her treatment.   Otherwise, would recommend visiting the website [email or web address] for general information.

## 2019-06-03 NOTE — Addendum Note (Signed)
Addended by: Lesleigh Noe on: 06/03/2019 04:10 PM   Modules accepted: Orders

## 2019-06-03 NOTE — Telephone Encounter (Signed)
Left VM requesting pt to call the office back 

## 2019-06-03 NOTE — Telephone Encounter (Signed)
Patient called today in regards to her prescription She stated that she has been taking the [facility] and her blood sugars are still elevated. She stated it can range between 152-185 Patient would like to know what she should do  Please advise

## 2019-06-04 ENCOUNTER — Telehealth: Payer: Self-pay | Admitting: Hematology & Oncology

## 2019-06-04 ENCOUNTER — Encounter: Payer: Self-pay | Admitting: Hematology & Oncology

## 2019-06-04 ENCOUNTER — Inpatient Hospital Stay: Payer: Federal, State, Local not specified - PPO

## 2019-06-04 ENCOUNTER — Inpatient Hospital Stay (HOSPITAL_BASED_OUTPATIENT_CLINIC_OR_DEPARTMENT_OTHER): Payer: Federal, State, Local not specified - PPO | Admitting: Hematology & Oncology

## 2019-06-04 ENCOUNTER — Other Ambulatory Visit: Payer: Self-pay

## 2019-06-04 VITALS — BP 152/91 | HR 89 | Temp 97.9°F | Resp 17

## 2019-06-04 VITALS — BP 136/76 | HR 81 | Temp 97.7°F | Resp 20 | Wt 154.1 lb

## 2019-06-04 DIAGNOSIS — K7460 Unspecified cirrhosis of liver: Secondary | ICD-10-CM | POA: Diagnosis not present

## 2019-06-04 DIAGNOSIS — R200 Anesthesia of skin: Secondary | ICD-10-CM | POA: Diagnosis not present

## 2019-06-04 DIAGNOSIS — E119 Type 2 diabetes mellitus without complications: Secondary | ICD-10-CM | POA: Diagnosis not present

## 2019-06-04 DIAGNOSIS — M311 Thrombotic microangiopathy: Secondary | ICD-10-CM

## 2019-06-04 DIAGNOSIS — R202 Paresthesia of skin: Secondary | ICD-10-CM | POA: Diagnosis not present

## 2019-06-04 DIAGNOSIS — K7581 Nonalcoholic steatohepatitis (NASH): Secondary | ICD-10-CM | POA: Diagnosis not present

## 2019-06-04 DIAGNOSIS — R5383 Other fatigue: Secondary | ICD-10-CM | POA: Diagnosis not present

## 2019-06-04 DIAGNOSIS — Z7984 Long term (current) use of oral hypoglycemic drugs: Secondary | ICD-10-CM | POA: Diagnosis not present

## 2019-06-04 DIAGNOSIS — M3119 Other thrombotic microangiopathy: Secondary | ICD-10-CM

## 2019-06-04 DIAGNOSIS — Z5112 Encounter for antineoplastic immunotherapy: Secondary | ICD-10-CM | POA: Diagnosis not present

## 2019-06-04 DIAGNOSIS — R600 Localized edema: Secondary | ICD-10-CM | POA: Diagnosis not present

## 2019-06-04 DIAGNOSIS — R233 Spontaneous ecchymoses: Secondary | ICD-10-CM | POA: Diagnosis not present

## 2019-06-04 DIAGNOSIS — D72819 Decreased white blood cell count, unspecified: Secondary | ICD-10-CM | POA: Diagnosis not present

## 2019-06-04 DIAGNOSIS — Z79899 Other long term (current) drug therapy: Secondary | ICD-10-CM | POA: Diagnosis not present

## 2019-06-04 LAB — CMP (CANCER CENTER ONLY)
ALT: 26 U/L (ref 0–44)
AST: 40 U/L (ref 15–41)
Albumin: 3.8 g/dL (ref 3.5–5.0)
Alkaline Phosphatase: 83 U/L (ref 38–126)
Anion gap: 5 (ref 5–15)
BUN: 9 mg/dL (ref 6–20)
CO2: 29 mmol/L (ref 22–32)
Calcium: 9.1 mg/dL (ref 8.9–10.3)
Chloride: 105 mmol/L (ref 98–111)
Creatinine: 0.72 mg/dL (ref 0.44–1.00)
GFR, Est AFR Am: >60 mL/min (ref >60)
GFR, Estimated: >60 mL/min (ref >60)
Glucose, Bld: 241 mg/dL — ABNORMAL HIGH (ref 70–99)
Potassium: 3.7 mmol/L (ref 3.5–5.1)
Sodium: 139 mmol/L (ref 135–145)
Total Bilirubin: 0.4 mg/dL (ref 0.3–1.2)
Total Protein: 6.2 g/dL — ABNORMAL LOW (ref 6.5–8.1)

## 2019-06-04 LAB — THERAPEUTIC PLASMA EXCHANGE (BLOOD BANK)
Plasma Exchange: 3001
Plasma volume needed: 3000
Unit division: 0
Unit division: 0
Unit division: 0
Unit division: 0
Unit division: 0
Unit division: 0

## 2019-06-04 LAB — CBC WITH DIFFERENTIAL (CANCER CENTER ONLY)
Abs Immature Granulocytes: 0 10*3/uL (ref 0.00–0.07)
Basophils Absolute: 0 10*3/uL (ref 0.0–0.1)
Basophils Relative: 0 %
Eosinophils Absolute: 0.2 10*3/uL (ref 0.0–0.5)
Eosinophils Relative: 9 %
HCT: 29.9 % — ABNORMAL LOW (ref 36.0–46.0)
Hemoglobin: 9.9 g/dL — ABNORMAL LOW (ref 12.0–15.0)
Immature Granulocytes: 0 %
Lymphocytes Relative: 21 %
Lymphs Abs: 0.4 10*3/uL — ABNORMAL LOW (ref 0.7–4.0)
MCH: 31.1 pg (ref 26.0–34.0)
MCHC: 33.1 g/dL (ref 30.0–36.0)
MCV: 94 fL (ref 80.0–100.0)
Monocytes Absolute: 0.2 10*3/uL (ref 0.1–1.0)
Monocytes Relative: 11 %
Neutro Abs: 1.2 10*3/uL — ABNORMAL LOW (ref 1.7–7.7)
Neutrophils Relative %: 59 %
Platelet Count: 87 10*3/uL — ABNORMAL LOW (ref 150–400)
RBC: 3.18 MIL/uL — ABNORMAL LOW (ref 3.87–5.11)
RDW: 13.2 % (ref 11.5–15.5)
WBC Count: 1.9 10*3/uL — ABNORMAL LOW (ref 4.0–10.5)
nRBC: 0 % (ref 0.0–0.2)

## 2019-06-04 LAB — RETICULOCYTES
Immature Retic Fract: 13.7 % (ref 2.3–15.9)
RBC.: 3.2 MIL/uL — ABNORMAL LOW (ref 3.87–5.11)
Retic Count, Absolute: 51.8 10*3/uL (ref 19.0–186.0)
Retic Ct Pct: 1.6 % (ref 0.4–3.1)

## 2019-06-04 LAB — LACTATE DEHYDROGENASE: LDH: 219 U/L — ABNORMAL HIGH (ref 98–192)

## 2019-06-04 LAB — SAVE SMEAR(SSMR), FOR PROVIDER SLIDE REVIEW

## 2019-06-04 MED ORDER — METHYLPREDNISOLONE SODIUM SUCC 125 MG IJ SOLR
60.0000 mg | Freq: Once | INTRAMUSCULAR | Status: AC
  Administered 2019-06-04: 60 mg via INTRAVENOUS

## 2019-06-04 MED ORDER — METHYLPREDNISOLONE SODIUM SUCC 125 MG IJ SOLR
INTRAMUSCULAR | Status: AC
  Filled 2019-06-04: qty 2

## 2019-06-04 MED ORDER — SODIUM CHLORIDE 0.9 % IV SOLN
40.0000 mg | Freq: Once | INTRAVENOUS | Status: AC
  Administered 2019-06-04: 40 mg via INTRAVENOUS
  Filled 2019-06-04: qty 4

## 2019-06-04 MED ORDER — ACETAMINOPHEN 325 MG PO TABS
650.0000 mg | ORAL_TABLET | Freq: Once | ORAL | Status: AC
  Administered 2019-06-04: 650 mg via ORAL

## 2019-06-04 MED ORDER — ACETAMINOPHEN 325 MG PO TABS
ORAL_TABLET | ORAL | Status: AC
  Filled 2019-06-04: qty 2

## 2019-06-04 MED ORDER — DIPHENHYDRAMINE HCL 25 MG PO CAPS
ORAL_CAPSULE | ORAL | Status: AC
  Filled 2019-06-04: qty 2

## 2019-06-04 MED ORDER — SODIUM CHLORIDE 0.9 % IV SOLN
375.0000 mg/m2 | Freq: Once | INTRAVENOUS | Status: AC
  Administered 2019-06-04: 600 mg via INTRAVENOUS
  Filled 2019-06-04: qty 10

## 2019-06-04 MED ORDER — SODIUM CHLORIDE 0.9 % IV SOLN
Freq: Once | INTRAVENOUS | Status: AC
  Filled 2019-06-04: qty 250

## 2019-06-04 MED ORDER — DIPHENHYDRAMINE HCL 25 MG PO CAPS
50.0000 mg | ORAL_CAPSULE | Freq: Once | ORAL | Status: AC
  Administered 2019-06-04: 50 mg via ORAL

## 2019-06-04 NOTE — Progress Notes (Signed)
Hematology and Oncology Follow Up Visit  Vicki [name] [id number] 07-22-1960 59 y.o. 06/04/2019   Principle Diagnosis:   TTP - acquired -- relapsed  NASH -- leukopenia/thrombocytopenia  Current Therapy:    Plasma Exchange - M- W-F --   Rituxan 375 mg/m2 IV weekly -- s/p cycle #3     Interim History:  Vicki [name] is back for her follow-up.  She is still doing quite nicely.  Last time I saw her, which was a week ago, her  ADAMTS-13 level was 80%.  This is amazing.  We started her treatments for the plasma exchange with a relapse of her TTP, the  ADAMTS-13 level was less than 2%.  Her main problem continues to be the diabetes.  This is very poorly controlled.  I told her as long as her blood sugars are so high, her liver is going to have a hard very hard time and will make it more difficult for the liver to function properly and that her platelets will likely go down a little bit.  She has had no bleeding.  There is been no bruising.  She has had no fever.  She has had no cough.  She is still doing the plasma exchanges Monday-Wednesday-Friday.  I would like to think that we might be able to decrease her frequency to Monday-Thursday starting on April 5.  She has had no issues with her bowels or bladder.  There is no diarrhea.  Her last LDH was 228.  Overall, her performance status is ECOG 1.   Medications:  Current Outpatient Medications:  .  Alum Hydroxide-Mag Carbonate (GAVISCON EXTRA STRENGTH PO), Take 1 tablet by mouth 4 (four) times daily as needed., Disp: , Rfl:  .  calcium carbonate (TUMS - DOSED IN MG ELEMENTAL CALCIUM) 500 MG chewable tablet, Chew 2 tablets (400 mg of elemental calcium total) by mouth every 3 (three) hours. (Patient taking differently: Chew 2 tablets by mouth as needed for indigestion. ), Disp: 30 tablet, Rfl: 0 .  cholecalciferol (VITAMIN D3) 25 MCG (1000 UT) tablet, Take 1,000 Units by mouth daily., Disp: , Rfl:  .  dextromethorphan-guaiFENesin  (MUCINEX DM) 30-600 MG 12hr tablet, Take 2 tablets by mouth 2 (two) times daily as needed for cough., Disp: , Rfl:  .  diphenhydrAMINE (BENADRYL) 50 MG capsule, Take 1 capsule (50 mg total) by mouth daily as needed (on call for plasma exchange)., Disp: 30 capsule, Rfl: 0 .  Fexofenadine-Pseudoephedrine (ALLEGRA-D 24 HOUR PO), Take by mouth daily as needed. , Disp: , Rfl:  .  folic acid (FOLVITE) 1 MG tablet, Take 2 tablets (2 mg total) by mouth daily., Disp: 60 tablet, Rfl: 12 .  furosemide (LASIX) 40 MG tablet, Take 1 tablet by mouth once daily, Disp: 30 tablet, Rfl: 0 .  glucose blood test strip, One touch ultra, Disp: , Rfl:  .  Multiple Vitamins-Minerals (MULTI FOR HER 50+) TABS, Take 1 tablet by mouth daily., Disp: , Rfl:  .  potassium chloride (KLOR-CON) 10 MEQ tablet, Take 1 tablet by mouth once daily, Disp: 30 tablet, Rfl: 0 .  senna-docusate (SENOKOT-S) 8.6-50 MG tablet, Take 2 tablets by mouth 2 (two) times daily., Disp: 20 tablet, Rfl: 0 .  sitaGLIPtin (JANUVIA) 100 MG tablet, Take 1 tablet (100 mg total) by mouth daily., Disp: 90 tablet, Rfl: 0  Allergies:  Allergies  Allergen Reactions  . Metformin And Related Itching    Heart palpitation  . Sulfa Antibiotics Itching  . Sulfasalazine Itching  Past Medical History, Surgical history, Social history, and Family History were reviewed and updated.  Review of Systems: Review of Systems  Constitutional: Negative.   HENT:  Negative.   Eyes: Negative.   Respiratory: Negative.   Cardiovascular: Positive for leg swelling.  Gastrointestinal: Negative.   Endocrine: Negative.   Genitourinary: Negative.    Musculoskeletal: Positive for arthralgias and myalgias.  Skin: Negative.   Neurological: Negative.   Hematological: Negative.   Psychiatric/Behavioral: Negative.     Physical Exam:  weight is 154 lb 1.3 oz (69.9 kg). Her temporal temperature is 97.7 F (36.5 C). Her blood pressure is 136/76 and her pulse is 81. Her  respiration is 20.   Wt Readings from Last 3 Encounters:  06/04/19 154 lb 1.3 oz (69.9 kg)  05/30/19 156 lb (70.8 kg)  05/23/19 167 lb 1.7 oz (75.8 kg)    Physical Exam Vitals reviewed.  HENT:     Head: Normocephalic and atraumatic.  Eyes:     Pupils: Pupils are equal, round, and reactive to light.  Cardiovascular:     Rate and Rhythm: Normal rate and regular rhythm.     Heart sounds: Normal heart sounds.  Pulmonary:     Effort: Pulmonary effort is normal.     Breath sounds: Normal breath sounds.  Abdominal:     General: Bowel sounds are normal.     Palpations: Abdomen is soft.  Musculoskeletal:        General: No tenderness or deformity. Normal range of motion.     Cervical back: Normal range of motion.     Comments: Extremities shows some 1+ edema in her lower legs.  She has good range of motion of her joints.  She has good strength in upper and lower extremities.  Lymphadenopathy:     Cervical: No cervical adenopathy.  Skin:    General: Skin is warm and dry.     Findings: No erythema or rash.  Neurological:     Mental Status: She is alert and oriented to person, place, and time.  Psychiatric:        Behavior: Behavior normal.        Thought Content: Thought content normal.        Judgment: Judgment normal.      Lab Results  Component Value Date   WBC 1.9 (L) 06/04/2019   HGB 9.9 (L) 06/04/2019   HCT 29.9 (L) 06/04/2019   MCV 94.0 06/04/2019   PLT 87 (L) 06/04/2019     Chemistry      Component Value Date/Time   NA 138 06/03/2019 0956   K 3.6 06/03/2019 0956   CL 104 06/03/2019 0956   CO2 25 06/03/2019 0956   BUN 7 06/03/2019 0956   CREATININE 0.67 06/03/2019 0956   CREATININE 0.86 05/30/2019 0832   CREATININE 0.70 09/25/2015 1504      Component Value Date/Time   CALCIUM 9.0 06/03/2019 0956   ALKPHOS 84 05/30/2019 0832   AST 32 05/30/2019 0832   ALT 21 05/30/2019 0832   BILITOT 0.4 05/30/2019 0832       Impression and Plan: Vicki [name] is a  59 year old African-American female.  She has acquired TTP.  She has relapsed.  She is responding incredibly well.  Her platelet count today is 87,000.  Again, her platelet count will never be normal.  We will see what her LDH is.  She will continue her weekly Rituxan.  She is post to go out to [facility] on April 14 for  the hepatologist and to possibly inquire about a liver transplant.  I would think that given the TTP, a liver transplant is probably going to not happen.  As always, we had an excellent prayer session.  I would like to see her back in 1 week.   Volanda Napoleon, MD 3/30/20219:43 AM

## 2019-06-04 NOTE — Telephone Encounter (Signed)
Appointments scheduled calendar printed per 3/30 los

## 2019-06-04 NOTE — Patient Instructions (Signed)
Rituximab injection What is this medicine? RITUXIMAB (ri TUX i mab) is a monoclonal antibody. It is used to treat certain types of cancer like non-Hodgkin lymphoma and chronic lymphocytic leukemia. It is also used to treat rheumatoid arthritis, granulomatosis with polyangiitis (or Wegener's granulomatosis), microscopic polyangiitis, and pemphigus vulgaris. This medicine may be used for other purposes; ask your health care provider or pharmacist if you have questions. COMMON BRAND NAME(S): Rituxan, RUXIENCE What should I tell my health care provider before I take this medicine? They need to know if you have any of these conditions:  heart disease  infection (especially a virus infection such as hepatitis B, chickenpox, cold sores, or herpes)  immune system problems  irregular heartbeat  kidney disease  low blood counts, like low white cell, platelet, or red cell counts  lung or breathing disease, like asthma  recently received or scheduled to receive a vaccine  an unusual or allergic reaction to rituximab, other medicines, foods, dyes, or preservatives  pregnant or trying to get pregnant  breast-feeding How should I use this medicine? This medicine is for infusion into a vein. It is administered in a hospital or clinic by a specially trained health care professional. A special MedGuide will be given to you by the pharmacist with each prescription and refill. Be sure to read this information carefully each time. Talk to your pediatrician regarding the use of this medicine in children. This medicine is not approved for use in children. Overdosage: If you think you have taken too much of this medicine contact a poison control center or emergency room at once. NOTE: This medicine is only for you. Do not share this medicine with others. What if I miss a dose? It is important not to miss a dose. Call your doctor or health care professional if you are unable to keep an appointment. What  may interact with this medicine?  cisplatin  live virus vaccines This list may not describe all possible interactions. Give your health care provider a list of all the medicines, herbs, non-prescription drugs, or dietary supplements you use. Also tell them if you smoke, drink alcohol, or use illegal drugs. Some items may interact with your medicine. What should I watch for while using this medicine? Your condition will be monitored carefully while you are receiving this medicine. You may need blood work done while you are taking this medicine. This medicine can cause serious allergic reactions. To reduce your risk you may need to take medicine before treatment with this medicine. Take your medicine as directed. In some patients, this medicine may cause a serious brain infection that may cause death. If you have any problems seeing, thinking, speaking, walking, or standing, tell your healthcare professional right away. If you cannot reach your healthcare professional, urgently seek other source of medical care. Call your doctor or health care professional for advice if you get a fever, chills or sore throat, or other symptoms of a cold or flu. Do not treat yourself. This drug decreases your body's ability to fight infections. Try to avoid being around people who are sick. Do not become pregnant while taking this medicine or for at least 12 months after stopping it. Women should inform their doctor if they wish to become pregnant or think they might be pregnant. There is a potential for serious side effects to an unborn child. Talk to your health care professional or pharmacist for more information. Do not breast-feed an infant while taking this medicine or for at   least 6 months after stopping it. What side effects may I notice from receiving this medicine? Side effects that you should report to your doctor or health care professional as soon as possible:  allergic reactions like skin rash, itching or  hives; swelling of the face, lips, or tongue  breathing problems  chest pain  changes in vision  diarrhea  headache with fever, neck stiffness, sensitivity to light, nausea, or confusion  fast, irregular heartbeat  loss of memory  low blood counts - this medicine may decrease the number of white blood cells, red blood cells and platelets. You may be at increased risk for infections and bleeding.  mouth sores  problems with balance, talking, or walking  redness, blistering, peeling or loosening of the skin, including inside the mouth  signs of infection - fever or chills, cough, sore throat, pain or difficulty passing urine  signs and symptoms of kidney injury like trouble passing urine or change in the amount of urine  signs and symptoms of liver injury like dark yellow or brown urine; general ill feeling or flu-like symptoms; light-colored stools; loss of appetite; nausea; right upper belly pain; unusually weak or tired; yellowing of the eyes or skin  signs and symptoms of low blood pressure like dizziness; feeling faint or lightheaded, falls; unusually weak or tired  stomach pain  swelling of the ankles, feet, hands  unusual bleeding or bruising  vomiting Side effects that usually do not require medical attention (report to your doctor or health care professional if they continue or are bothersome):  headache  joint pain  muscle cramps or muscle pain  nausea  tiredness This list may not describe all possible side effects. Call your doctor for medical advice about side effects. You may report side effects to FDA at 1-800-FDA-1088. Where should I keep my medicine? This drug is given in a hospital or clinic and will not be stored at home. NOTE: This sheet is a summary. It may not cover all possible information. If you have questions about this medicine, talk to your doctor, pharmacist, or health care provider.  2020 Elsevier/Gold Standard (2018-04-04  22:01:36)  

## 2019-06-05 ENCOUNTER — Inpatient Hospital Stay: Payer: Federal, State, Local not specified - PPO | Admitting: Hematology & Oncology

## 2019-06-05 ENCOUNTER — Inpatient Hospital Stay: Payer: Federal, State, Local not specified - PPO

## 2019-06-05 ENCOUNTER — Non-Acute Institutional Stay (HOSPITAL_COMMUNITY)
Admission: RE | Admit: 2019-06-05 | Discharge: 2019-06-05 | Disposition: A | Discharge status: Home / Self Care | Payer: Federal, State, Local not specified - PPO | Source: Ambulatory Visit | Attending: Hematology & Oncology | Admitting: Hematology & Oncology

## 2019-06-05 DIAGNOSIS — M311 Thrombotic microangiopathy: Secondary | ICD-10-CM | POA: Insufficient documentation

## 2019-06-05 LAB — BASIC METABOLIC PANEL
Anion gap: 10 (ref 5–15)
BUN: 8 mg/dL (ref 6–20)
CO2: 25 mmol/L (ref 22–32)
Calcium: 8.7 mg/dL — ABNORMAL LOW (ref 8.9–10.3)
Chloride: 100 mmol/L (ref 98–111)
Creatinine, Ser: 0.81 mg/dL (ref 0.44–1.00)
GFR calc Af Amer: >60 mL/min (ref >60)
GFR calc non Af Amer: >60 mL/min (ref >60)
Glucose, Bld: 387 mg/dL — ABNORMAL HIGH (ref 70–99)
Potassium: 3.5 mmol/L (ref 3.5–5.1)
Sodium: 135 mmol/L (ref 135–145)

## 2019-06-05 LAB — ADAMTS13 ACTIVITY: Adamts 13 Activity: 91.6 % (ref >66.8)

## 2019-06-05 LAB — ADAMTS13 ACTIVITY REFLEX

## 2019-06-05 MED ORDER — DIPHENHYDRAMINE HCL 25 MG PO CAPS: 25.0000 mg | ORAL_CAPSULE | Freq: Four times a day (QID) | ORAL | Status: DC | PRN

## 2019-06-05 MED ORDER — ANTICOAGULANT SODIUM CITRATE 4% (200MG/5ML) IV SOLN
5.0000 mL | Freq: Once | Status: DC
  Filled 2019-06-05: qty 5

## 2019-06-05 MED ORDER — CALCIUM CARBONATE ANTACID 500 MG PO CHEW: 2.0000 | CHEWABLE_TABLET | ORAL | Status: DC

## 2019-06-05 MED ORDER — ACETAMINOPHEN 325 MG PO TABS: 650.0000 mg | ORAL_TABLET | ORAL | Status: DC | PRN

## 2019-06-05 MED ORDER — CALCIUM CARBONATE ANTACID 500 MG PO CHEW
CHEWABLE_TABLET | ORAL | Status: AC
  Administered 2019-06-05: 400 mg via ORAL
  Filled 2019-06-05: qty 4

## 2019-06-05 MED ORDER — DIPHENHYDRAMINE HCL 25 MG PO CAPS
ORAL_CAPSULE | ORAL | Status: AC
  Administered 2019-06-05: 25 mg via ORAL
  Filled 2019-06-05: qty 1

## 2019-06-05 MED ORDER — ACD FORMULA A 0.73-2.45-2.2 GM/100ML VI SOLN
Status: AC
  Filled 2019-06-05: qty 500

## 2019-06-05 MED ORDER — CALCIUM GLUCONATE-NACL 2-0.675 GM/100ML-% IV SOLN
2.0000 g | Freq: Once | INTRAVENOUS | Status: AC
  Administered 2019-06-05: 2000 mg via INTRAVENOUS
  Filled 2019-06-05: qty 100

## 2019-06-05 MED ORDER — ACD FORMULA A 0.73-2.45-2.2 GM/100ML VI SOLN: 1000.0000 mL | Status: DC

## 2019-06-05 NOTE — Progress Notes (Signed)
Pt tolerated TPE well; denies pain, n/v, dizziness, weakness or paresthesia. Pt accompanied to the main entrance to go home by Richard the transporter. Pt aware that the next treatment is 06/07/19.

## 2019-06-05 NOTE — Telephone Encounter (Signed)
Left message for patient to call back  

## 2019-06-06 ENCOUNTER — Encounter: Payer: Federal, State, Local not specified - PPO | Attending: Family Medicine | Admitting: Registered"

## 2019-06-06 ENCOUNTER — Ambulatory Visit: Payer: Federal, State, Local not specified - PPO | Admitting: Family Medicine

## 2019-06-06 ENCOUNTER — Other Ambulatory Visit: Payer: Self-pay

## 2019-06-06 ENCOUNTER — Encounter: Payer: Self-pay | Admitting: Registered"

## 2019-06-06 ENCOUNTER — Telehealth: Payer: Self-pay | Admitting: *Deleted

## 2019-06-06 DIAGNOSIS — E119 Type 2 diabetes mellitus without complications: Secondary | ICD-10-CM | POA: Diagnosis not present

## 2019-06-06 LAB — THERAPEUTIC PLASMA EXCHANGE (BLOOD BANK)
Plasma Exchange: 3000
Plasma volume needed: 3000
Unit division: 0
Unit division: 0
Unit division: 0
Unit division: 0
Unit division: 0
Unit division: 0
Unit division: 0
Unit division: 0

## 2019-06-06 NOTE — Telephone Encounter (Signed)
Pt canceled appointment for today after meeting with nutritionist.

## 2019-06-06 NOTE — Patient Instructions (Addendum)
Instructions/Goals:  Have 3 meals per day/eat every 3-5 hours  Recommend 2-3 carbohydrate choices per meal (30-45 g carbohydrates per meal). Can up to 3-4 choices per meal if you get too hungry-staying consistent is key.   If hungry in between having protein with 1 carbohydrate choice  Recommend checking blood sugar 2 times daily: fasting (Goal: 80-130 unless otherwise instructed by your doctor) and 1-2 hours after starting a meal (Goal: under 180).   Consult your doctor before starting physical activities

## 2019-06-06 NOTE — Telephone Encounter (Signed)
Call placed to [facility] at the dialysis unit at [facility] to notify the unit per order of Dr. Marin Olp that starting on 06/10/19, Dr. Marin Olp would like plasma exchange to be done on Monday and Thursday.

## 2019-06-06 NOTE — Telephone Encounter (Signed)
Pt notified as instructed and pt voiced understanding; pt has dietician appt this afternoon at 2 PM. Pt wants to see Dr Einar Pheasant because pt is concerned that she will go into a diabetic coma;  06/01/19 FBS 155                                  06/02/19 FBS 185    06/03/19 not taken    06/04/19 not taken    06/05/19 4 pm BS after oncology    treatment and pt had eaten 330    06/06/19 FBS 236    06/06/19 FBS at 10 AM 238 I tried to reassure pt with these BS pt should not go into a diabetic coma. Pt said she would rather talk with Dr Einar Pheasant. Dr Einar Pheasant said could do virtual or in person appt and after dietician's appt if pt is more reassured she can call and cancel todays appt. Pt said she would feel better to talk with Dr Einar Pheasant. FYI to Dr Einar Pheasant. Pt scheduled in office appt today at 4:20 pm.

## 2019-06-06 NOTE — Progress Notes (Signed)
Diabetes Self-Management Education  Visit Type: First/Initial  Appt. Start Time: 1405 Appt. End Time: 1532  06/12/2019  [name], identified by name and date of birth, is a 59 y.o. female with a diagnosis of Diabetes: Type 2.   ASSESSMENT  Weight 154 lb 12.8 oz (70.2 kg), last menstrual period 04/30/2012. Body mass index is 29.25 kg/m.   Pt reports she feels her blood sugar is elevated due to her treatments and not her nutrition. Pt has medical hx of TTP, pancytopenia (receives platelets regularly), cirrhosis, and type 2 diabetes. Pt reports she received an injection of Rituximab earlier this week. She reports her blood sugar used to be well managed. Reports it went up since her recent health problems. Reports she started eating some differently- trying to eat foods to help with low platelets. Reports she started doing more fruits and Gatorade. After discussing carbohydrate counting, pt reports that carbohydrate amounts do add up more than she thought.   Pt checks blood sugar 2-3 times daily lately since numbers have been higher. Reports fasting has been: 236, 185, 252, 154 over last several days. Reports a 330 yesterday 3-4 hours after treatment and had drank a Boost Glucose Control and ate some sandwich. Last hgbA1c was 6.8 on 05/16/2019.  Pt reports drinking 4 bottles water most days, sometimes sweet tea with lemon.   Pt reports very low energy since having pancytopenia.   Diabetes Self-Management Education - 06/06/19 1417      Visit Information   Visit Type  First/Initial      Initial Visit   Diabetes Type  Type 2    Are you currently following a meal plan?  No    Are you taking your medications as prescribed?  Yes    Date Diagnosed  --   Unsure of exact date.     Health Coping   How would you rate your overall health?  Poor      Psychosocial Assessment   Patient Belief/Attitude about Diabetes  Motivated to manage diabetes    Self-care barriers  None     Self-management support  None    Other persons present  Patient    Patient Concerns  Glycemic Control;Nutrition/Meal planning    Special Needs  None    Preferred Learning Style  No preference indicated    Learning Readiness  Ready    How often do you need to have someone help you when you read instructions, pamphlets, or other written materials from your doctor or pharmacy?  1 - Never    What is the last grade level you completed in school?  Some college      Pre-Education Assessment   Patient understands the diabetes disease and treatment process.  Needs Instruction    Patient understands incorporating nutritional management into lifestyle.  Needs Instruction    Patient undertands incorporating physical activity into lifestyle.  Needs Instruction    Patient understands using medications safely.  Demonstrates understanding / competency    Patient understands monitoring blood glucose, interpreting and using results  Needs Instruction    Patient understands prevention, detection, and treatment of acute complications.  Needs Instruction    Patient understands prevention, detection, and treatment of chronic complications.  Needs Instruction    Patient understands how to develop strategies to address psychosocial issues.  Needs Instruction    Patient understands how to develop strategies to promote health/change behavior.  Needs Instruction      Complications   Last HgB A1C per patient/outside  source  6.8 %    How often do you check your blood sugar?  3-4 times/day    Fasting Blood glucose range (mg/dL)  >200;180-200;130-179    Postprandial Blood glucose range (mg/dL)  >200   Only one reading reported.   Number of hypoglycemic episodes per month  0    Number of hyperglycemic episodes per week  --   Every day.   Have you had a dilated eye exam in the past 12 months?  No    Have you had a dental exam in the past 12 months?  Yes    Are you checking your feet?  Yes    How many days per week  are you checking your feet?  3      Dietary Intake   Breakfast  730 AM: oatmeal with butter, 1 tbsp sugar, [place] sausage, egg, on sandwich, water    Snack (morning)  None reported.    Lunch  half [place] sandwich with mayo, Boost Glucose Control    Snack (afternoon)  None reported.    Dinner  9 PM [employer] half a large order of chili beans, garden salad with ranch, water    Snack (evening)  None reported.    Beverage(s)  ~4 bottles water most days.      Exercise   Exercise Type  ADL's    How many days per week to you exercise?  0    How many minutes per day do you exercise?  0    Total minutes per week of exercise  0      Patient Education   Previous Diabetes Education  --   Not reported.   Disease state   Definition of diabetes, type 1 and 2, and the diagnosis of diabetes;Factors that contribute to the development of diabetes    Nutrition management   Food label reading, portion sizes and measuring food.;Carbohydrate counting;Role of diet in the treatment of diabetes and the relationship between the three main macronutrients and blood glucose level    Physical activity and exercise   Role of exercise on diabetes management, blood pressure control and cardiac health.    Monitoring  Purpose and frequency of SMBG.;Interpreting lab values - A1C, lipid, urine microalbumina.;Daily foot exams;Yearly dilated eye exam;Identified appropriate SMBG and/or A1C goals.    Acute complications  Taught treatment of hypoglycemia - the 15 rule.;Discussed and identified patients' treatment of hyperglycemia.    Chronic complications  Retinopathy and reason for yearly dilated eye exams;Reviewed with patient heart disease, higher risk of, and prevention;Dental care;Lipid levels, blood glucose control and heart disease;Relationship between chronic complications and blood glucose control    Psychosocial adjustment  Worked with patient to identify barriers to care and solutions      Individualized Goals (developed  by patient)   Nutrition  Follow meal plan discussed;General guidelines for healthy choices and portions discussed    Physical Activity  --   Consult doctor regarding appropriate and safe activities   Medications  take my medication as prescribed    Monitoring   test my blood glucose as discussed      Post-Education Assessment   Patient understands the diabetes disease and treatment process.  Demonstrates understanding / competency    Patient understands incorporating nutritional management into lifestyle.  Demonstrates understanding / competency    Patient undertands incorporating physical activity into lifestyle.  Demonstrates understanding / competency    Patient understands using medications safely.  Demonstrates understanding / competency    Patient  understands monitoring blood glucose, interpreting and using results  Demonstrates understanding / competency    Patient understands prevention, detection, and treatment of acute complications.  Demonstrates understanding / competency    Patient understands prevention, detection, and treatment of chronic complications.  Demonstrates understanding / competency    Patient understands how to develop strategies to address psychosocial issues.  Demonstrates understanding / competency    Patient understands how to develop strategies to promote health/change behavior.  Demonstrates understanding / competency      Outcomes   Expected Outcomes  Demonstrated interest in learning. Expect positive outcomes    Future DMSE  4-6 wks    Program Status  Completed       Individualized Plan for Diabetes Self-Management Training:   Learning Objective:  Patient will have a greater understanding of diabetes self-management. Patient education plan is to attend individual and/or group sessions per assessed needs and concerns.   Plan: Dietitian provided DSME. Emphasized importance of consistent eating pattern and carbohydrate intake as well as including protein  and vegetables to help balanced blood sugar. Recommended pt talk with her oncologist before including any physical activity to ensure she does not over do things. Pt has concerns about when she has high blood sugar and wanted to know about checking for ketones. Recommended pt consult her endocrinologist regarding checking ketones (pt reports going to appointment with endocrinologist after this visit). Pt appeared agreeable to information/goals discussed.    Instructions/Goals:  Have 3 meals per day/eat every 3-5 hours  Recommend 2-3 carbohydrate choices per meal (30-45 g carbohydrates per meal). Can up to 3-4 choices per meal if you get too hungry-staying consistent is key.   If hungry in between having protein with 1 carbohydrate choice  Recommend checking blood sugar 2 times daily: fasting (Goal: 80-130 unless otherwise instructed by your doctor) and 1-2 hours after starting a meal (Goal: under 180).   Consult your doctor before starting physical activities    Patient Instructions  Instructions/Goals:  Have 3 meals per day/eat every 3-5 hours  Recommend 2-3 carbohydrate choices per meal (30-45 g carbohydrates per meal). Can up to 3-4 choices per meal if you get too hungry-staying consistent is key.   If hungry in between having protein with 1 carbohydrate choice  Recommend checking blood sugar 2 times daily: fasting (Goal: 80-130 unless otherwise instructed by your doctor) and 1-2 hours after starting a meal (Goal: under 180).   Consult your doctor before starting physical activities    Expected Outcomes:  Demonstrated interest in learning. Expect positive outcomes  Education material provided: ADA - How to Thrive: A Guide for Your Journey with Diabetes, Meal plan card, My Plate and Snack sheet  If problems or questions, patient to contact team via:  Phone and Email  Future DSME appointment: 4-6 wks-pt to call to scheduled follow-up appointment due to busy schedule.

## 2019-06-07 ENCOUNTER — Non-Acute Institutional Stay (HOSPITAL_COMMUNITY)
Admission: AD | Admit: 2019-06-07 | Discharge: 2019-06-07 | Disposition: A | Discharge status: Home / Self Care | Payer: Federal, State, Local not specified - PPO | Source: Ambulatory Visit | Attending: Hematology & Oncology | Admitting: Hematology & Oncology

## 2019-06-07 DIAGNOSIS — D696 Thrombocytopenia, unspecified: Secondary | ICD-10-CM | POA: Insufficient documentation

## 2019-06-07 LAB — POCT I-STAT, CHEM 8
BUN: 4 mg/dL — ABNORMAL LOW (ref 6–20)
Calcium, Ion: 1.1 mmol/L — ABNORMAL LOW (ref 1.15–1.40)
Chloride: 101 mmol/L (ref 98–111)
Creatinine, Ser: 0.5 mg/dL (ref 0.44–1.00)
Glucose, Bld: 251 mg/dL — ABNORMAL HIGH (ref 70–99)
HCT: 28 % — ABNORMAL LOW (ref 36.0–46.0)
Hemoglobin: 9.5 g/dL — ABNORMAL LOW (ref 12.0–15.0)
Potassium: 3.4 mmol/L — ABNORMAL LOW (ref 3.5–5.1)
Sodium: 142 mmol/L (ref 135–145)
TCO2: 25 mmol/L (ref 22–32)

## 2019-06-07 MED ORDER — DIPHENHYDRAMINE HCL 25 MG PO CAPS
ORAL_CAPSULE | ORAL | Status: AC
  Filled 2019-06-07: qty 1

## 2019-06-07 MED ORDER — DIPHENHYDRAMINE HCL 25 MG PO CAPS
25.0000 mg | ORAL_CAPSULE | Freq: Four times a day (QID) | ORAL | Status: DC | PRN
  Administered 2019-06-07: 25 mg via ORAL

## 2019-06-07 MED ORDER — ACD FORMULA A 0.73-2.45-2.2 GM/100ML VI SOLN
Status: AC
  Filled 2019-06-07: qty 500

## 2019-06-07 MED ORDER — CALCIUM CARBONATE ANTACID 500 MG PO CHEW
CHEWABLE_TABLET | ORAL | Status: AC
  Administered 2019-06-07: 400 mg
  Filled 2019-06-07: qty 2

## 2019-06-07 MED ORDER — CALCIUM CARBONATE ANTACID 500 MG PO CHEW
CHEWABLE_TABLET | ORAL | Status: AC
  Filled 2019-06-07: qty 2

## 2019-06-07 MED ORDER — CALCIUM GLUCONATE-NACL 2-0.675 GM/100ML-% IV SOLN
2.0000 g | Freq: Once | INTRAVENOUS | Status: AC
  Administered 2019-06-07: 2000 mg via INTRAVENOUS
  Filled 2019-06-07: qty 100

## 2019-06-07 MED ORDER — ACETAMINOPHEN 325 MG PO TABS: 650.0000 mg | ORAL_TABLET | ORAL | Status: DC | PRN

## 2019-06-07 MED ORDER — CALCIUM CARBONATE ANTACID 500 MG PO CHEW
2.0000 | CHEWABLE_TABLET | ORAL | Status: AC
  Administered 2019-06-07: 400 mg via ORAL

## 2019-06-07 MED ORDER — ACD FORMULA A 0.73-2.45-2.2 GM/100ML VI SOLN
1000.0000 mL | Status: DC
  Administered 2019-06-07: 1000 mL

## 2019-06-07 MED ORDER — ANTICOAGULANT SODIUM CITRATE 4% (200MG/5ML) IV SOLN
5.0000 mL | Freq: Once | Status: AC
  Administered 2019-06-07: 5 mL
  Filled 2019-06-07: qty 5

## 2019-06-07 NOTE — Progress Notes (Signed)
Patient TPE treatment complete without complications.  VSS post treatment and patient is without complaint.  Patient aware of next outpatient TPE appointment on Monday, June 12, 2019.  Patient discharged home with self care.

## 2019-06-08 LAB — THERAPEUTIC PLASMA EXCHANGE (BLOOD BANK)
Plasma Exchange: 3000
Plasma volume needed: 3000
Unit division: 0
Unit division: 0
Unit division: 0
Unit division: 0
Unit division: 0
Unit division: 0
Unit division: 0
Unit division: 0

## 2019-06-10 ENCOUNTER — Telehealth: Payer: Self-pay | Admitting: Cardiovascular Disease

## 2019-06-10 ENCOUNTER — Other Ambulatory Visit: Payer: Self-pay

## 2019-06-10 ENCOUNTER — Non-Acute Institutional Stay (HOSPITAL_COMMUNITY)
Admission: AD | Admit: 2019-06-10 | Discharge: 2019-06-10 | Disposition: A | Discharge status: Home / Self Care | Payer: Federal, State, Local not specified - PPO | Source: Ambulatory Visit | Attending: Hematology & Oncology | Admitting: Hematology & Oncology

## 2019-06-10 ENCOUNTER — Ambulatory Visit (HOSPITAL_BASED_OUTPATIENT_CLINIC_OR_DEPARTMENT_OTHER): Payer: Federal, State, Local not specified - PPO

## 2019-06-10 DIAGNOSIS — M311 Thrombotic microangiopathy: Secondary | ICD-10-CM | POA: Diagnosis not present

## 2019-06-10 DIAGNOSIS — R002 Palpitations: Secondary | ICD-10-CM

## 2019-06-10 DIAGNOSIS — R011 Cardiac murmur, unspecified: Secondary | ICD-10-CM

## 2019-06-10 LAB — CBC
HCT: 27.6 % — ABNORMAL LOW (ref 36.0–46.0)
Hemoglobin: 9.1 g/dL — ABNORMAL LOW (ref 12.0–15.0)
MCH: 31.1 pg (ref 26.0–34.0)
MCHC: 33 g/dL (ref 30.0–36.0)
MCV: 94.2 fL (ref 80.0–100.0)
Platelets: 60 10*3/uL — ABNORMAL LOW (ref 150–400)
RBC: 2.93 MIL/uL — ABNORMAL LOW (ref 3.87–5.11)
RDW: 13.3 % (ref 11.5–15.5)
WBC: 1.7 10*3/uL — ABNORMAL LOW (ref 4.0–10.5)
nRBC: 0 % (ref 0.0–0.2)

## 2019-06-10 LAB — BASIC METABOLIC PANEL
Anion gap: 4 — ABNORMAL LOW (ref 5–15)
BUN: <5 mg/dL — ABNORMAL LOW (ref 6–20)
CO2: 25 mmol/L (ref 22–32)
Calcium: 8.9 mg/dL (ref 8.9–10.3)
Chloride: 108 mmol/L (ref 98–111)
Creatinine, Ser: 0.67 mg/dL (ref 0.44–1.00)
GFR calc Af Amer: >60 mL/min (ref >60)
GFR calc non Af Amer: >60 mL/min (ref >60)
Glucose, Bld: 246 mg/dL — ABNORMAL HIGH (ref 70–99)
Potassium: 4 mmol/L (ref 3.5–5.1)
Sodium: 137 mmol/L (ref 135–145)

## 2019-06-10 LAB — ECHOCARDIOGRAM COMPLETE

## 2019-06-10 MED ORDER — CALCIUM GLUCONATE-NACL 2-0.675 GM/100ML-% IV SOLN
2.0000 g | INTRAVENOUS | Status: AC
  Administered 2019-06-10: 2000 mg via INTRAVENOUS
  Filled 2019-06-10: qty 100

## 2019-06-10 MED ORDER — ANTICOAGULANT SODIUM CITRATE 4% (200MG/5ML) IV SOLN
5.0000 mL | Status: AC
  Administered 2019-06-10: 5 mL
  Filled 2019-06-10: qty 5

## 2019-06-10 MED ORDER — CALCIUM CARBONATE ANTACID 500 MG PO CHEW: 2.0000 | CHEWABLE_TABLET | ORAL | Status: DC

## 2019-06-10 MED ORDER — DIPHENHYDRAMINE HCL 25 MG PO CAPS
ORAL_CAPSULE | ORAL | Status: AC
  Administered 2019-06-10: 25 mg via ORAL
  Filled 2019-06-10: qty 1

## 2019-06-10 MED ORDER — DIPHENHYDRAMINE HCL 25 MG PO CAPS: 25.0000 mg | ORAL_CAPSULE | Freq: Four times a day (QID) | ORAL | Status: DC | PRN

## 2019-06-10 MED ORDER — ACD FORMULA A 0.73-2.45-2.2 GM/100ML VI SOLN
Status: AC
  Administered 2019-06-10: 1000 mL
  Filled 2019-06-10: qty 500

## 2019-06-10 MED ORDER — ACETAMINOPHEN 325 MG PO TABS
ORAL_TABLET | ORAL | Status: AC
  Filled 2019-06-10: qty 2

## 2019-06-10 MED ORDER — ACD FORMULA A 0.73-2.45-2.2 GM/100ML VI SOLN: 1000.0000 mL | Status: DC

## 2019-06-10 MED ORDER — CALCIUM CARBONATE ANTACID 500 MG PO CHEW
CHEWABLE_TABLET | ORAL | Status: AC
  Administered 2019-06-10: 400 mg via ORAL
  Filled 2019-06-10: qty 2

## 2019-06-10 MED ORDER — ACETAMINOPHEN 325 MG PO TABS
650.0000 mg | ORAL_TABLET | ORAL | Status: DC | PRN
  Administered 2019-06-10: 650 mg via ORAL

## 2019-06-10 NOTE — Telephone Encounter (Signed)
New Message    Pt is calling and says she is having a plasma therapy done today and is wondering if it is okay she still comes for her Echo today    please advise

## 2019-06-10 NOTE — Telephone Encounter (Signed)
Returned call to patient no answer.LMTC. 

## 2019-06-10 NOTE — Progress Notes (Signed)
Patient TPE treatment complete without complications.  VSS post treatment and patient is without complaint.   Patient discharged home with self care.

## 2019-06-10 NOTE — Telephone Encounter (Signed)
I see no reason why she could not come for her 2D echo today.

## 2019-06-10 NOTE — Telephone Encounter (Signed)
Spoke to patient Dr.Berry's advice given.Stated she is on her way to office to have echo.

## 2019-06-11 LAB — THERAPEUTIC PLASMA EXCHANGE (BLOOD BANK)
Plasma Exchange: 2900
Plasma volume needed: 2900
Unit division: 0
Unit division: 0
Unit division: 0
Unit division: 0
Unit division: 0
Unit division: 0
Unit division: 0
Unit division: 0
Unit division: 0
Unit division: 0

## 2019-06-12 DIAGNOSIS — R109 Unspecified abdominal pain: Secondary | ICD-10-CM | POA: Diagnosis not present

## 2019-06-12 DIAGNOSIS — K7460 Unspecified cirrhosis of liver: Secondary | ICD-10-CM | POA: Diagnosis not present

## 2019-06-12 NOTE — H&P (Signed)
I have no idea what you are needing from me.  She does not need an H&P for outpatient plasma exchange.  I will pray about this!!  Have a very blessed day!  Laurey Arrow

## 2019-06-13 ENCOUNTER — Non-Acute Institutional Stay (HOSPITAL_COMMUNITY)
Admission: AD | Admit: 2019-06-13 | Discharge: 2019-06-13 | Disposition: A | Discharge status: Home / Self Care | Payer: Federal, State, Local not specified - PPO | Source: Ambulatory Visit | Attending: Hematology & Oncology | Admitting: Hematology & Oncology

## 2019-06-13 DIAGNOSIS — M311 Thrombotic microangiopathy: Secondary | ICD-10-CM | POA: Diagnosis not present

## 2019-06-13 LAB — CBC
HCT: 28.8 % — ABNORMAL LOW (ref 36.0–46.0)
Hemoglobin: 9.5 g/dL — ABNORMAL LOW (ref 12.0–15.0)
MCH: 31.5 pg (ref 26.0–34.0)
MCHC: 33 g/dL (ref 30.0–36.0)
MCV: 95.4 fL (ref 80.0–100.0)
Platelets: 59 10*3/uL — ABNORMAL LOW (ref 150–400)
RBC: 3.02 MIL/uL — ABNORMAL LOW (ref 3.87–5.11)
RDW: 13.4 % (ref 11.5–15.5)
WBC: 1.5 10*3/uL — ABNORMAL LOW (ref 4.0–10.5)
nRBC: 0 % (ref 0.0–0.2)

## 2019-06-13 LAB — BASIC METABOLIC PANEL
Anion gap: 11 (ref 5–15)
BUN: <5 mg/dL — ABNORMAL LOW (ref 6–20)
CO2: 23 mmol/L (ref 22–32)
Calcium: 8.6 mg/dL — ABNORMAL LOW (ref 8.9–10.3)
Chloride: 107 mmol/L (ref 98–111)
Creatinine, Ser: 0.64 mg/dL (ref 0.44–1.00)
GFR calc Af Amer: >60 mL/min (ref >60)
GFR calc non Af Amer: >60 mL/min (ref >60)
Glucose, Bld: 259 mg/dL — ABNORMAL HIGH (ref 70–99)
Potassium: 3.9 mmol/L (ref 3.5–5.1)
Sodium: 141 mmol/L (ref 135–145)

## 2019-06-13 MED ORDER — CALCIUM CARBONATE ANTACID 500 MG PO CHEW
CHEWABLE_TABLET | ORAL | Status: AC
  Administered 2019-06-13: 400 mg via ORAL
  Filled 2019-06-13: qty 4

## 2019-06-13 MED ORDER — ACETAMINOPHEN 325 MG PO TABS: 650.0000 mg | ORAL_TABLET | ORAL | Status: DC | PRN

## 2019-06-13 MED ORDER — CALCIUM GLUCONATE-NACL 2-0.675 GM/100ML-% IV SOLN
2.0000 g | Freq: Once | INTRAVENOUS | Status: AC
  Administered 2019-06-13: 2000 mg via INTRAVENOUS
  Filled 2019-06-13: qty 100

## 2019-06-13 MED ORDER — DIPHENHYDRAMINE HCL 25 MG PO CAPS: 25.0000 mg | ORAL_CAPSULE | Freq: Four times a day (QID) | ORAL | Status: DC | PRN

## 2019-06-13 MED ORDER — ACD FORMULA A 0.73-2.45-2.2 GM/100ML VI SOLN: 1000.0000 mL | Status: DC

## 2019-06-13 MED ORDER — HEPARIN SODIUM (PORCINE) 1000 UNIT/ML IJ SOLN: 1000.0000 [IU] | Freq: Once | INTRAMUSCULAR | Status: DC

## 2019-06-13 MED ORDER — ACD FORMULA A 0.73-2.45-2.2 GM/100ML VI SOLN
Status: AC
  Filled 2019-06-13: qty 500

## 2019-06-13 MED ORDER — DIPHENHYDRAMINE HCL 25 MG PO CAPS
ORAL_CAPSULE | ORAL | Status: AC
  Administered 2019-06-13: 25 mg via ORAL
  Filled 2019-06-13: qty 1

## 2019-06-13 MED ORDER — CALCIUM CARBONATE ANTACID 500 MG PO CHEW: 2.0000 | CHEWABLE_TABLET | ORAL | Status: DC

## 2019-06-13 MED ORDER — ANTICOAGULANT SODIUM CITRATE 4% (200MG/5ML) IV SOLN
5.0000 mL | Status: AC
  Administered 2019-06-13: 5 mL
  Filled 2019-06-13: qty 5

## 2019-06-14 ENCOUNTER — Inpatient Hospital Stay (HOSPITAL_BASED_OUTPATIENT_CLINIC_OR_DEPARTMENT_OTHER): Payer: Federal, State, Local not specified - PPO | Admitting: Hematology & Oncology

## 2019-06-14 ENCOUNTER — Encounter: Payer: Self-pay | Admitting: Hematology & Oncology

## 2019-06-14 ENCOUNTER — Inpatient Hospital Stay: Payer: Federal, State, Local not specified - PPO | Attending: Hematology

## 2019-06-14 ENCOUNTER — Telehealth: Payer: Self-pay | Admitting: Hematology & Oncology

## 2019-06-14 ENCOUNTER — Telehealth: Payer: Self-pay | Admitting: *Deleted

## 2019-06-14 ENCOUNTER — Other Ambulatory Visit: Payer: Self-pay

## 2019-06-14 VITALS — HR 90 | Temp 97.8°F | Resp 17 | Wt 152.0 lb

## 2019-06-14 DIAGNOSIS — M3119 Other thrombotic microangiopathy: Secondary | ICD-10-CM

## 2019-06-14 DIAGNOSIS — K7581 Nonalcoholic steatohepatitis (NASH): Secondary | ICD-10-CM | POA: Diagnosis not present

## 2019-06-14 DIAGNOSIS — M311 Thrombotic microangiopathy: Secondary | ICD-10-CM | POA: Diagnosis not present

## 2019-06-14 LAB — RETICULOCYTES
Immature Retic Fract: 12.2 % (ref 2.3–15.9)
RBC.: 3.33 MIL/uL — ABNORMAL LOW (ref 3.87–5.11)
Retic Count, Absolute: 34.3 10*3/uL (ref 19.0–186.0)
Retic Ct Pct: 1 % (ref 0.4–3.1)

## 2019-06-14 LAB — CBC WITH DIFFERENTIAL (CANCER CENTER ONLY)
Abs Immature Granulocytes: 0.04 10*3/uL (ref 0.00–0.07)
Basophils Absolute: 0 10*3/uL (ref 0.0–0.1)
Basophils Relative: 1 %
Eosinophils Absolute: 0.2 10*3/uL (ref 0.0–0.5)
Eosinophils Relative: 11 %
HCT: 30.8 % — ABNORMAL LOW (ref 36.0–46.0)
Hemoglobin: 10.1 g/dL — ABNORMAL LOW (ref 12.0–15.0)
Immature Granulocytes: 3 %
Lymphocytes Relative: 24 %
Lymphs Abs: 0.4 10*3/uL — ABNORMAL LOW (ref 0.7–4.0)
MCH: 30.3 pg (ref 26.0–34.0)
MCHC: 32.8 g/dL (ref 30.0–36.0)
MCV: 92.5 fL (ref 80.0–100.0)
Monocytes Absolute: 0.2 10*3/uL (ref 0.1–1.0)
Monocytes Relative: 14 %
Neutro Abs: 0.8 10*3/uL — ABNORMAL LOW (ref 1.7–7.7)
Neutrophils Relative %: 47 %
Platelet Count: 80 10*3/uL — ABNORMAL LOW (ref 150–400)
RBC: 3.33 MIL/uL — ABNORMAL LOW (ref 3.87–5.11)
RDW: 13.2 % (ref 11.5–15.5)
WBC Count: 1.6 10*3/uL — ABNORMAL LOW (ref 4.0–10.5)
nRBC: 0 % (ref 0.0–0.2)

## 2019-06-14 LAB — THERAPEUTIC PLASMA EXCHANGE (BLOOD BANK)
Plasma Exchange: 3000
Plasma volume needed: 3000
Unit division: 0
Unit division: 0
Unit division: 0
Unit division: 0
Unit division: 0

## 2019-06-14 LAB — CMP (CANCER CENTER ONLY)
ALT: 23 U/L (ref 0–44)
AST: 43 U/L — ABNORMAL HIGH (ref 15–41)
Albumin: 3.9 g/dL (ref 3.5–5.0)
Alkaline Phosphatase: 78 U/L (ref 38–126)
Anion gap: 7 (ref 5–15)
BUN: 7 mg/dL (ref 6–20)
CO2: 27 mmol/L (ref 22–32)
Calcium: 9.2 mg/dL (ref 8.9–10.3)
Chloride: 105 mmol/L (ref 98–111)
Creatinine: 0.73 mg/dL (ref 0.44–1.00)
GFR, Est AFR Am: >60 mL/min (ref >60)
GFR, Estimated: >60 mL/min (ref >60)
Glucose, Bld: 228 mg/dL — ABNORMAL HIGH (ref 70–99)
Potassium: 3.5 mmol/L (ref 3.5–5.1)
Sodium: 139 mmol/L (ref 135–145)
Total Bilirubin: 0.5 mg/dL (ref 0.3–1.2)
Total Protein: 6.3 g/dL — ABNORMAL LOW (ref 6.5–8.1)

## 2019-06-14 LAB — LACTATE DEHYDROGENASE: LDH: 235 U/L — ABNORMAL HIGH (ref 98–192)

## 2019-06-14 NOTE — Progress Notes (Signed)
Hematology and Oncology Follow Up Visit  Vicki [name] [id number] [birth year] 59 y.o. 06/14/2019   Principle Diagnosis:   TTP - acquired -- relapsed  NASH -- leukopenia/thrombocytopenia  Current Therapy:    Plasma Exchange - M-Th --   Rituxan 375 mg/m2 IV weekly -- s/p cycle #4     Interim History:  Vicki [name] is back for her follow-up.  She is still doing quite nicely.  Last time I saw her, which was a week ago, her  ADAMTS-13 level was 91%.  This is amazing.  We started her treatments for the plasma exchange with a relapse of her TTP, the  ADAMTS-13 level was less than 2%.  Her main problem continues to be the diabetes.  This is very poorly controlled.  I told her as long as her blood sugars are so high, her liver is going to have a hard very hard time and will make it more difficult for the liver to function properly and that her platelets will likely go down a little bit.  She saw the hepatologist at [facility].  She did not feel that Vicki [name] was going to need a liver transplant anytime soon.  She has had no bleeding.  There is been no bruising.  She has had no fever.  She has had no cough.  She has had no issues with her bowels or bladder.  There is no diarrhea.  Overall, her performance status is ECOG 1.   Medications:  Current Outpatient Medications:  .  Alum Hydroxide-Mag Carbonate (GAVISCON EXTRA STRENGTH PO), Take 1 tablet by mouth 4 (four) times daily as needed., Disp: , Rfl:  .  calcium carbonate (TUMS - DOSED IN MG ELEMENTAL CALCIUM) 500 MG chewable tablet, Chew 2 tablets (400 mg of elemental calcium total) by mouth every 3 (three) hours. (Patient taking differently: Chew 2 tablets by mouth as needed for indigestion. ), Disp: 30 tablet, Rfl: 0 .  cholecalciferol (VITAMIN D3) 25 MCG (1000 UT) tablet, Take 1,000 Units by mouth daily., Disp: , Rfl:  .  dextromethorphan-guaiFENesin (MUCINEX DM) 30-600 MG 12hr tablet, Take 2 tablets by mouth 2 (two) times daily as  needed for cough., Disp: , Rfl:  .  diphenhydrAMINE (BENADRYL) 50 MG capsule, Take 1 capsule (50 mg total) by mouth daily as needed (on call for plasma exchange)., Disp: 30 capsule, Rfl: 0 .  Fexofenadine-Pseudoephedrine (ALLEGRA-D 24 HOUR PO), Take by mouth daily as needed. , Disp: , Rfl:  .  folic acid (FOLVITE) 1 MG tablet, Take 2 tablets (2 mg total) by mouth daily., Disp: 60 tablet, Rfl: 12 .  furosemide (LASIX) 40 MG tablet, Take 1 tablet by mouth once daily, Disp: 30 tablet, Rfl: 0 .  glucose blood test strip, One touch ultra, Disp: , Rfl:  .  Multiple Vitamins-Minerals (MULTI FOR HER 50+) TABS, Take 1 tablet by mouth daily., Disp: , Rfl:  .  potassium chloride (KLOR-CON) 10 MEQ tablet, Take 1 tablet by mouth once daily, Disp: 30 tablet, Rfl: 0 .  senna-docusate (SENOKOT-S) 8.6-50 MG tablet, Take 2 tablets by mouth 2 (two) times daily., Disp: 20 tablet, Rfl: 0 .  sitaGLIPtin (JANUVIA) 100 MG tablet, Take 1 tablet (100 mg total) by mouth daily., Disp: 90 tablet, Rfl: 0  Allergies:  Allergies  Allergen Reactions  . Metformin And Related Itching    Heart palpitation  . Sulfa Antibiotics Itching  . Sulfasalazine Itching    Past Medical History, Surgical history, Social history, and Family History were  reviewed and updated.  Review of Systems: Review of Systems  Constitutional: Negative.   HENT:  Negative.   Eyes: Negative.   Respiratory: Negative.   Cardiovascular: Positive for leg swelling.  Gastrointestinal: Negative.   Endocrine: Negative.   Genitourinary: Negative.    Musculoskeletal: Positive for arthralgias and myalgias.  Skin: Negative.   Neurological: Negative.   Hematological: Negative.   Psychiatric/Behavioral: Negative.     Physical Exam:  weight is 152 lb (68.9 kg). Her temporal temperature is 97.8 F (36.6 C). Her pulse is 90. Her respiration is 17 and oxygen saturation is 100%.   Wt Readings from Last 3 Encounters:  06/14/19 152 lb (68.9 kg)  06/06/19 154  lb 12.8 oz (70.2 kg)  06/04/19 154 lb 1.3 oz (69.9 kg)    Physical Exam Vitals reviewed.  HENT:     Head: Normocephalic and atraumatic.  Eyes:     Pupils: Pupils are equal, round, and reactive to light.  Cardiovascular:     Rate and Rhythm: Normal rate and regular rhythm.     Heart sounds: Normal heart sounds.  Pulmonary:     Effort: Pulmonary effort is normal.     Breath sounds: Normal breath sounds.  Abdominal:     General: Bowel sounds are normal.     Palpations: Abdomen is soft.  Musculoskeletal:        General: No tenderness or deformity. Normal range of motion.     Cervical back: Normal range of motion.     Comments: Extremities shows some 1+ edema in her lower legs.  She has good range of motion of her joints.  She has good strength in upper and lower extremities.  Lymphadenopathy:     Cervical: No cervical adenopathy.  Skin:    General: Skin is warm and dry.     Findings: No erythema or rash.  Neurological:     Mental Status: She is alert and oriented to person, place, and time.  Psychiatric:        Behavior: Behavior normal.        Thought Content: Thought content normal.        Judgment: Judgment normal.      Lab Results  Component Value Date   WBC 1.6 (L) 06/14/2019   HGB 10.1 (L) 06/14/2019   HCT 30.8 (L) 06/14/2019   MCV 92.5 06/14/2019   PLT 80 (L) 06/14/2019     Chemistry      Component Value Date/Time   NA 139 06/14/2019 0922   K 3.5 06/14/2019 0922   CL 105 06/14/2019 0922   CO2 27 06/14/2019 0922   BUN 7 06/14/2019 0922   CREATININE 0.73 06/14/2019 0922   CREATININE 0.70 09/25/2015 1504      Component Value Date/Time   CALCIUM 9.2 06/14/2019 0922   ALKPHOS 78 06/14/2019 0922   AST 43 (H) 06/14/2019 0922   ALT 23 06/14/2019 0922   BILITOT 0.5 06/14/2019 0922       Impression and Plan: Vicki [name] is a 59 year old African-American female.  She has acquired TTP.  She has relapsed.  She is responding incredibly well.  Her platelet  count today is 80,000.  Again, her platelet count will never be normal.  We will see what her LDH is.  We will continue the plasma exchange every Monday and Thursday for the next couple weeks.  Hopefully, the ADAMTS-13 will stay elevated.  This should be a good indicator that the TTP is at a very low level  or in remission.  We will now plan to get her back in about 3 weeks.  As always, we had an excellent prayer session.   Volanda Napoleon, MD 4/9/202111:28 AM

## 2019-06-14 NOTE — Telephone Encounter (Signed)
Adam on the Dialysis unit at [facility] notified per order of Dr. Marin Olp that pt needs plasma exchange on Monday and Thursday for the next three weeks.

## 2019-06-14 NOTE — Telephone Encounter (Signed)
Called and LMVM for patient with updated appointments per 4/9 los

## 2019-06-15 LAB — ADAMTS13 ACTIVITY REFLEX

## 2019-06-15 LAB — ADAMTS13 ACTIVITY: Adamts 13 Activity: >100 % (ref >66.8)

## 2019-06-17 ENCOUNTER — Non-Acute Institutional Stay (HOSPITAL_COMMUNITY)
Admission: AD | Admit: 2019-06-17 | Discharge: 2019-06-17 | Disposition: A | Discharge status: Home / Self Care | Payer: Federal, State, Local not specified - PPO | Source: Ambulatory Visit | Attending: Hematology & Oncology | Admitting: Hematology & Oncology

## 2019-06-17 ENCOUNTER — Telehealth: Payer: Self-pay | Admitting: Family Medicine

## 2019-06-17 DIAGNOSIS — L0292 Furuncle, unspecified: Secondary | ICD-10-CM

## 2019-06-17 DIAGNOSIS — M311 Thrombotic microangiopathy: Secondary | ICD-10-CM | POA: Insufficient documentation

## 2019-06-17 LAB — CBC
HCT: 30.7 % — ABNORMAL LOW (ref 36.0–46.0)
Hemoglobin: 10.1 g/dL — ABNORMAL LOW (ref 12.0–15.0)
MCH: 30.7 pg (ref 26.0–34.0)
MCHC: 32.9 g/dL (ref 30.0–36.0)
MCV: 93.3 fL (ref 80.0–100.0)
Platelets: 80 10*3/uL — ABNORMAL LOW (ref 150–400)
RBC: 3.29 MIL/uL — ABNORMAL LOW (ref 3.87–5.11)
RDW: 13.2 % (ref 11.5–15.5)
WBC: 2.2 10*3/uL — ABNORMAL LOW (ref 4.0–10.5)
nRBC: 0 % (ref 0.0–0.2)

## 2019-06-17 LAB — BASIC METABOLIC PANEL
Anion gap: 11 (ref 5–15)
BUN: 5 mg/dL — ABNORMAL LOW (ref 6–20)
CO2: 23 mmol/L (ref 22–32)
Calcium: 8.9 mg/dL (ref 8.9–10.3)
Chloride: 105 mmol/L (ref 98–111)
Creatinine, Ser: 0.66 mg/dL (ref 0.44–1.00)
GFR calc Af Amer: >60 mL/min (ref >60)
GFR calc non Af Amer: >60 mL/min (ref >60)
Glucose, Bld: 185 mg/dL — ABNORMAL HIGH (ref 70–99)
Potassium: 3.8 mmol/L (ref 3.5–5.1)
Sodium: 139 mmol/L (ref 135–145)

## 2019-06-17 MED ORDER — DIPHENHYDRAMINE HCL 25 MG PO CAPS: 25.0000 mg | ORAL_CAPSULE | Freq: Four times a day (QID) | ORAL | Status: DC | PRN

## 2019-06-17 MED ORDER — DIPHENHYDRAMINE HCL 25 MG PO CAPS
ORAL_CAPSULE | ORAL | Status: AC
  Administered 2019-06-17: 25 mg via ORAL
  Filled 2019-06-17: qty 1

## 2019-06-17 MED ORDER — CALCIUM CARBONATE ANTACID 500 MG PO CHEW
2.0000 | CHEWABLE_TABLET | ORAL | Status: AC
  Administered 2019-06-17: 400 mg via ORAL

## 2019-06-17 MED ORDER — ACD FORMULA A 0.73-2.45-2.2 GM/100ML VI SOLN: 1000.0000 mL | Status: DC

## 2019-06-17 MED ORDER — ACETAMINOPHEN 325 MG PO TABS
ORAL_TABLET | ORAL | Status: AC
  Filled 2019-06-17: qty 2

## 2019-06-17 MED ORDER — ANTICOAGULANT SODIUM CITRATE 4% (200MG/5ML) IV SOLN
5.0000 mL | Status: AC
  Administered 2019-06-17: 5 mL
  Filled 2019-06-17 (×2): qty 5

## 2019-06-17 MED ORDER — CALCIUM CARBONATE ANTACID 500 MG PO CHEW
CHEWABLE_TABLET | ORAL | Status: AC
  Administered 2019-06-17: 400 mg via ORAL
  Filled 2019-06-17: qty 4

## 2019-06-17 MED ORDER — ACETAMINOPHEN 325 MG PO TABS
650.0000 mg | ORAL_TABLET | ORAL | Status: DC | PRN
  Administered 2019-06-17: 650 mg via ORAL

## 2019-06-17 MED ORDER — CALCIUM GLUCONATE-NACL 2-0.675 GM/100ML-% IV SOLN
2.0000 g | INTRAVENOUS | Status: AC
  Administered 2019-06-17: 2000 mg via INTRAVENOUS
  Filled 2019-06-17: qty 100

## 2019-06-17 MED ORDER — ACD FORMULA A 0.73-2.45-2.2 GM/100ML VI SOLN
Status: AC
  Administered 2019-06-17: 1000 mL
  Filled 2019-06-17: qty 500

## 2019-06-17 MED ORDER — MUPIROCIN 2 % EX OINT: 1.0000 "application " | TOPICAL_OINTMENT | Freq: Two times a day (BID) | CUTANEOUS | 0 refills | Status: DC

## 2019-06-17 NOTE — Progress Notes (Signed)
Ms Bugh called back when she got home; her temp=98.35F.

## 2019-06-17 NOTE — Telephone Encounter (Signed)
Patient advised and verbalized understanding. Patient will let us know if she has a fever tomorrow and we will need to change to virtual or reschedule. Patient states right now temp is 98.7, earlier it was 100.2

## 2019-06-17 NOTE — Telephone Encounter (Signed)
Patient called today She stated that she has another boil on her leg, she would like to know if a medication could be prescribed to help heal the area. Patient stated that she Does have a Fever of 102.00. Advised with the fever we are not able to bring her into the office so she would like to know if you could send something in for her.      She did not want to reschedule the appointment for tomorrow.  please advise

## 2019-06-17 NOTE — Telephone Encounter (Signed)
Without seeing the lesion it is difficulty to assess.   Would recommend warm compress and topical antibiotic to the area to encourage drainage.   If enlarging or not self-draining recommend returning to clinic for evaluation.

## 2019-06-17 NOTE — Progress Notes (Signed)
Tx completed; pt accompanied to the main entrance by transport; pt states she has a boil in her left groin thats causing her some discomfort; advised the pt to tell her primary MD on her appointment tomorrow. Pt denies pain, dizziness, paresthesia, or n/v. Pt temparature levated 100.14F, Tylenol 644m PO given.

## 2019-06-18 ENCOUNTER — Other Ambulatory Visit: Payer: Self-pay

## 2019-06-18 ENCOUNTER — Ambulatory Visit: Payer: Federal, State, Local not specified - PPO | Admitting: Family Medicine

## 2019-06-18 ENCOUNTER — Encounter: Payer: Self-pay | Admitting: Family Medicine

## 2019-06-18 ENCOUNTER — Other Ambulatory Visit: Payer: Self-pay | Admitting: Hematology & Oncology

## 2019-06-18 VITALS — BP 102/52 | HR 98 | Temp 97.4°F | Ht 61.0 in | Wt 149.0 lb

## 2019-06-18 DIAGNOSIS — E119 Type 2 diabetes mellitus without complications: Secondary | ICD-10-CM

## 2019-06-18 DIAGNOSIS — L03115 Cellulitis of right lower limb: Secondary | ICD-10-CM | POA: Diagnosis not present

## 2019-06-18 DIAGNOSIS — L089 Local infection of the skin and subcutaneous tissue, unspecified: Secondary | ICD-10-CM | POA: Diagnosis not present

## 2019-06-18 LAB — THERAPEUTIC PLASMA EXCHANGE (BLOOD BANK)
Plasma Exchange: 3000
Plasma volume needed: 3000
Unit division: 0
Unit division: 0
Unit division: 0
Unit division: 0
Unit division: 0
Unit division: 0

## 2019-06-18 MED ORDER — CEPHALEXIN 500 MG PO CAPS: 500.0000 mg | ORAL_CAPSULE | Freq: Two times a day (BID) | ORAL | 0 refills | Status: AC

## 2019-06-18 MED ORDER — TRULICITY 0.75 MG/0.5ML ~~LOC~~ SOAJ: 0.7500 mg | SUBCUTANEOUS | 3 refills | Status: DC

## 2019-06-18 NOTE — Assessment & Plan Note (Signed)
Fasting CBG have improved from 200-300 to 150-200 which is good. Though discussed this could be improved. Will add Trulicity as she previously tolerated this medication. Return in 6 weeks.

## 2019-06-18 NOTE — Patient Instructions (Signed)
#  Diabetes - start trulicity - continue Januvia - Return in 4-6 weeks to check in  #Skin infection - start antibiotic - if not improving by Thursday, call the clinic - if you develop fevers, chills, worsening symptoms -- call immediately

## 2019-06-18 NOTE — Assessment & Plan Note (Signed)
Pt with pancytopenia as well as diabetes. Now with cellulitis. Previous exam with what seemed consistent with small epidermoid cyst though unable to appreciate today. Will treat with keflex and discussed ER precautions given risk factors and return if not improving. Will also refer to dermatology for post-evaluation to see if cyst present as this is a repeat infection for her.

## 2019-06-18 NOTE — Progress Notes (Signed)
Subjective:     Vicki [name] is a 59 y.o. female presenting for Blood Sugar Problem (Patient states blood sugars running in 200's when fasting )     HPI  #Diabetes - met with nutritionist last week - this visit went well - has modified diet - not eating very much - fasting CBG 200s - did see some improvement in her  - was running 200-300 in February - Hematologist advised getting control of her blood sugar - Taking Januvia w/o side effects - Failed: metformin - did take trulicity previously   #Recurrant boils - lesion on leg - initially got better but has returned - painful - also has a vulva lesion  Review of Systems   Social History   Tobacco Use  Smoking Status Never Smoker  Smokeless Tobacco Never Used        Objective:    BP Readings from Last 3 Encounters:  06/18/19 (!) 102/52  06/17/19 132/74  06/13/19 (!) 145/67   Wt Readings from Last 3 Encounters:  06/18/19 149 lb (67.6 kg)  06/14/19 152 lb (68.9 kg)  06/06/19 154 lb 12.8 oz (70.2 kg)    BP (!) 102/52 (BP Location: Left Arm, Patient Position: Sitting, Cuff Size: Normal)   Pulse 98   Temp (!) 97.4 F (36.3 C) (Temporal)   Ht 5' 1"  (1.549 m)   Wt 149 lb (67.6 kg)   LMP 04/30/2012   SpO2 98%   BMI 28.15 kg/m    Physical Exam Exam conducted with a chaperone present.  Constitutional:      General: She is not in acute distress.    Appearance: She is well-developed. She is not diaphoretic.  HENT:     Right Ear: External ear normal.     Left Ear: External ear normal.  Eyes:     Conjunctiva/sclera: Conjunctivae normal.  Cardiovascular:     Rate and Rhythm: Normal rate.  Pulmonary:     Effort: Pulmonary effort is normal.  Genitourinary:    Comments: Unable to appreciate lesion of concern on right labia.  Musculoskeletal:     Cervical back: Neck supple.  Skin:    General: Skin is warm and dry.     Capillary Refill: Capillary refill takes less than 2 seconds.     Comments:  Left inner thigh/groin with area of induration which is erythematous and tender to palpation. Non-fluctuant.   Neurological:     Mental Status: She is alert. Mental status is at baseline.  Psychiatric:        Mood and Affect: Mood normal.        Behavior: Behavior normal.           Assessment & Plan:   Problem List Items Addressed This Visit      Endocrine   Controlled type 2 diabetes mellitus without complication (HCC) - Primary (Chronic)    Fasting CBG have improved from 200-300 to 150-200 which is good. Though discussed this could be improved. Will add Trulicity as she previously tolerated this medication. Return in 6 weeks.       Relevant Medications   Dulaglutide (TRULICITY) 2.44 WN/0.2VO SOPN     Other   Cellulitis    Pt with pancytopenia as well as diabetes. Now with cellulitis. Previous exam with what seemed consistent with small epidermoid cyst though unable to appreciate today. Will treat with keflex and discussed ER precautions given risk factors and return if not improving. Will also refer to dermatology for post-evaluation to  see if cyst present as this is a repeat infection for her.       Relevant Medications   cephALEXin (KEFLEX) 500 MG capsule   Other Relevant Orders   Ambulatory referral to Dermatology    Other Visit Diagnoses    Recurrent infection of skin       Relevant Medications   cephALEXin (KEFLEX) 500 MG capsule   Other Relevant Orders   Ambulatory referral to Dermatology       Return in about 6 weeks (around 07/30/2019).  Lesleigh Noe, MD

## 2019-06-19 ENCOUNTER — Telehealth: Payer: Self-pay | Admitting: *Deleted

## 2019-06-19 NOTE — Telephone Encounter (Signed)
Call received from patient to inform Dr. Marin Olp that her PCP started her on Cephalexin for a "knot on her thigh" and Trulicity.  Dr. Marin Olp notified and no new orders received at this time. Pt notified.

## 2019-06-20 ENCOUNTER — Non-Acute Institutional Stay (HOSPITAL_COMMUNITY)
Admission: AD | Admit: 2019-06-20 | Discharge: 2019-06-20 | Disposition: A | Discharge status: Home / Self Care | Payer: Federal, State, Local not specified - PPO | Source: Ambulatory Visit | Attending: Hematology & Oncology | Admitting: Hematology & Oncology

## 2019-06-20 DIAGNOSIS — M311 Thrombotic microangiopathy: Secondary | ICD-10-CM | POA: Insufficient documentation

## 2019-06-20 LAB — BASIC METABOLIC PANEL
Anion gap: 9 (ref 5–15)
BUN: <5 mg/dL — ABNORMAL LOW (ref 6–20)
CO2: 23 mmol/L (ref 22–32)
Calcium: 8.8 mg/dL — ABNORMAL LOW (ref 8.9–10.3)
Chloride: 108 mmol/L (ref 98–111)
Creatinine, Ser: 0.62 mg/dL (ref 0.44–1.00)
GFR calc Af Amer: >60 mL/min (ref >60)
GFR calc non Af Amer: >60 mL/min (ref >60)
Glucose, Bld: 249 mg/dL — ABNORMAL HIGH (ref 70–99)
Potassium: 3.8 mmol/L (ref 3.5–5.1)
Sodium: 140 mmol/L (ref 135–145)

## 2019-06-20 LAB — CBC
HCT: 28.8 % — ABNORMAL LOW (ref 36.0–46.0)
Hemoglobin: 9.4 g/dL — ABNORMAL LOW (ref 12.0–15.0)
MCH: 29.9 pg (ref 26.0–34.0)
MCHC: 32.6 g/dL (ref 30.0–36.0)
MCV: 91.7 fL (ref 80.0–100.0)
Platelets: 72 10*3/uL — ABNORMAL LOW (ref 150–400)
RBC: 3.14 MIL/uL — ABNORMAL LOW (ref 3.87–5.11)
RDW: 13 % (ref 11.5–15.5)
WBC: 2 10*3/uL — ABNORMAL LOW (ref 4.0–10.5)
nRBC: 0 % (ref 0.0–0.2)

## 2019-06-20 MED ORDER — DIPHENHYDRAMINE HCL 25 MG PO CAPS
ORAL_CAPSULE | ORAL | Status: AC
  Filled 2019-06-20: qty 1

## 2019-06-20 MED ORDER — ACETAMINOPHEN 325 MG PO TABS
650.0000 mg | ORAL_TABLET | ORAL | Status: DC | PRN
  Administered 2019-06-20: 650 mg via ORAL

## 2019-06-20 MED ORDER — ANTICOAGULANT SODIUM CITRATE 4% (200MG/5ML) IV SOLN
5.0000 mL | Freq: Once | Status: AC
  Administered 2019-06-20: 5 mL
  Filled 2019-06-20: qty 5

## 2019-06-20 MED ORDER — CALCIUM GLUCONATE-NACL 2-0.675 GM/100ML-% IV SOLN
2.0000 g | Freq: Once | INTRAVENOUS | Status: DC
  Filled 2019-06-20: qty 100

## 2019-06-20 MED ORDER — ACETAMINOPHEN 325 MG PO TABS
ORAL_TABLET | ORAL | Status: AC
  Filled 2019-06-20: qty 2

## 2019-06-20 MED ORDER — CALCIUM CARBONATE ANTACID 500 MG PO CHEW
2.0000 | CHEWABLE_TABLET | ORAL | Status: DC
  Administered 2019-06-20: 400 mg via ORAL

## 2019-06-20 MED ORDER — DIPHENHYDRAMINE HCL 25 MG PO CAPS
25.0000 mg | ORAL_CAPSULE | Freq: Four times a day (QID) | ORAL | Status: DC | PRN
  Administered 2019-06-20: 25 mg via ORAL

## 2019-06-20 MED ORDER — CALCIUM CARBONATE ANTACID 500 MG PO CHEW
CHEWABLE_TABLET | ORAL | Status: AC
  Filled 2019-06-20: qty 2

## 2019-06-20 MED ORDER — ACD FORMULA A 0.73-2.45-2.2 GM/100ML VI SOLN
1000.0000 mL | Status: DC
  Administered 2019-06-20: 1000 mL

## 2019-06-20 MED ORDER — ACD FORMULA A 0.73-2.45-2.2 GM/100ML VI SOLN
Status: AC
  Filled 2019-06-20: qty 500

## 2019-06-20 NOTE — Progress Notes (Signed)
Tolerated Pheresis treatment with no issues or complaints, post vitals stable, Escorted by transportation to exit for discharge home,  gait steady.

## 2019-06-21 ENCOUNTER — Ambulatory Visit: Payer: Federal, State, Local not specified - PPO | Admitting: Physician Assistant

## 2019-06-21 ENCOUNTER — Other Ambulatory Visit: Payer: Self-pay

## 2019-06-21 ENCOUNTER — Encounter: Payer: Self-pay | Admitting: Physician Assistant

## 2019-06-21 DIAGNOSIS — L732 Hidradenitis suppurativa: Secondary | ICD-10-CM | POA: Diagnosis not present

## 2019-06-21 LAB — THERAPEUTIC PLASMA EXCHANGE (BLOOD BANK)
Plasma Exchange: 3001
Plasma volume needed: 3000
Unit division: 0
Unit division: 0
Unit division: 0
Unit division: 0
Unit division: 0
Unit division: 0
Unit division: 0
Unit division: 0

## 2019-06-21 NOTE — Progress Notes (Signed)
   New Patient Visit  Subjective  Vicki [name] is a 59 y.o. female who presents for the following: Cellulitis (On left upper thigh/groin. Started back in December and has been fluctuating in size.  Been flared for about a week now. No drainage. Using antibacterial ointment and warm compresses. PCP gave her Cephalexin on Tuesday. Has a small bump on right vaginal region that she is concerned.  Noticed it about 2 weeks ago. No changes in it and is using antibacterial ointment.). In December she was in hospital and Dx with TTP. She seemed to have a "boil" on the inside of the left thigh. PCP examined it and it began to bleed. It went down but then came back  About 1-2 weeks ago and is bad. It is larger. She normally gets boils in groin and one on buttocks and she has gotten them under her arms in the past. This has happened in and off since she was a teenager. Mom also has it. The armpits are less bothersome than they used to be. On Tuesday her PCP gave her Cephalexin but it is getting bigger instead of getting better.   Objective  Well appearing patient in no apparent distress; mood and affect are within normal limits.  A focused examination was performed including left inner groin.. Relevant physical exam findings are noted in the Assessment and Plan.  Objective  Left Medial Thigh: Large, indurated, inflamed nodule with surface pustule.  Assessment & Plan  Hidradenitis suppurativa Left Medial Thigh  Incision and Drainage - Left Medial Thigh Location: left inner thigh  Informed Consent: Discussed risks (permanent scarring, light or dark discoloration, infection, pain, bleeding, bruising, redness, damage to adjacent structures, and recurrence of the lesion) and benefits of the procedure, as well as the alternatives.  Informed consent was obtained.  Preparation: The area was prepped with chlorhexidine.  Anesthesia: Lidocaine 1% with epinephrine  Procedure Details: An incision was made  overlying the lesion. The lesion drained pus, white, chalky cyst material, and was malodorous.  A large amount of fluid was drained.   The lesion was multiloculated.   Pieces of cyst wall were extracted.    Antibiotic ointment and a sterile pressure dressing were applied. The patient tolerated procedure well.  Total number of lesions drained: 1 We also obtained a bacterial culture.  Plan: The patient was instructed on post-op care. Recommend OTC analgesia as needed for pain. She is to do warm compresses and continue her Cephalexin for now.   Anaerobic and Aerobic Culture - Left Medial Thigh

## 2019-06-24 ENCOUNTER — Non-Acute Institutional Stay (HOSPITAL_COMMUNITY)
Admission: AD | Admit: 2019-06-24 | Discharge: 2019-06-24 | Disposition: A | Discharge status: Home / Self Care | Payer: Federal, State, Local not specified - PPO | Source: Ambulatory Visit | Attending: Hematology & Oncology | Admitting: Hematology & Oncology

## 2019-06-24 DIAGNOSIS — M311 Thrombotic microangiopathy: Secondary | ICD-10-CM | POA: Insufficient documentation

## 2019-06-24 LAB — CBC
HCT: 30 % — ABNORMAL LOW (ref 36.0–46.0)
Hemoglobin: 9.7 g/dL — ABNORMAL LOW (ref 12.0–15.0)
MCH: 30 pg (ref 26.0–34.0)
MCHC: 32.3 g/dL (ref 30.0–36.0)
MCV: 92.9 fL (ref 80.0–100.0)
Platelets: 99 10*3/uL — ABNORMAL LOW (ref 150–400)
RBC: 3.23 MIL/uL — ABNORMAL LOW (ref 3.87–5.11)
RDW: 12.9 % (ref 11.5–15.5)
WBC: 2.1 10*3/uL — ABNORMAL LOW (ref 4.0–10.5)
nRBC: 0 % (ref 0.0–0.2)

## 2019-06-24 LAB — BASIC METABOLIC PANEL
Anion gap: 8 (ref 5–15)
BUN: 5 mg/dL — ABNORMAL LOW (ref 6–20)
CO2: 25 mmol/L (ref 22–32)
Calcium: 9.2 mg/dL (ref 8.9–10.3)
Chloride: 107 mmol/L (ref 98–111)
Creatinine, Ser: 0.63 mg/dL (ref 0.44–1.00)
GFR calc Af Amer: >60 mL/min (ref >60)
GFR calc non Af Amer: >60 mL/min (ref >60)
Glucose, Bld: 179 mg/dL — ABNORMAL HIGH (ref 70–99)
Potassium: 4 mmol/L (ref 3.5–5.1)
Sodium: 140 mmol/L (ref 135–145)

## 2019-06-24 MED ORDER — CALCIUM CARBONATE ANTACID 500 MG PO CHEW
CHEWABLE_TABLET | ORAL | Status: AC
  Administered 2019-06-24: 400 mg via ORAL
  Filled 2019-06-24: qty 4

## 2019-06-24 MED ORDER — CALCIUM CARBONATE ANTACID 500 MG PO CHEW: 2.0000 | CHEWABLE_TABLET | ORAL | Status: AC

## 2019-06-24 MED ORDER — ACD FORMULA A 0.73-2.45-2.2 GM/100ML VI SOLN: 1000.0000 mL | Status: DC

## 2019-06-24 MED ORDER — ACETAMINOPHEN 325 MG PO TABS: 650.0000 mg | ORAL_TABLET | ORAL | Status: DC | PRN

## 2019-06-24 MED ORDER — CALCIUM GLUCONATE-NACL 2-0.675 GM/100ML-% IV SOLN
2.0000 g | Freq: Once | INTRAVENOUS | Status: AC
  Administered 2019-06-24: 2000 mg via INTRAVENOUS
  Filled 2019-06-24: qty 100

## 2019-06-24 MED ORDER — DIPHENHYDRAMINE HCL 25 MG PO CAPS
ORAL_CAPSULE | ORAL | Status: AC
  Administered 2019-06-24: 25 mg via ORAL
  Filled 2019-06-24: qty 1

## 2019-06-24 MED ORDER — ANTICOAGULANT SODIUM CITRATE 4% (200MG/5ML) IV SOLN
5.0000 mL | Freq: Once | Status: AC
  Administered 2019-06-24: 5 mL
  Filled 2019-06-24: qty 5

## 2019-06-24 MED ORDER — DIPHENHYDRAMINE HCL 25 MG PO CAPS: 25.0000 mg | ORAL_CAPSULE | Freq: Four times a day (QID) | ORAL | Status: DC | PRN

## 2019-06-24 MED ORDER — ACD FORMULA A 0.73-2.45-2.2 GM/100ML VI SOLN
Status: AC
  Administered 2019-06-24: 1000 mL
  Filled 2019-06-24: qty 500

## 2019-06-24 NOTE — Progress Notes (Signed)
Tx completed; pt denies any pain, n/v, dizziness, ,parethesia, or weakness; pt accompanied to the main entrance by Willia Craze, [employer]

## 2019-06-25 LAB — THERAPEUTIC PLASMA EXCHANGE (BLOOD BANK)
Plasma Exchange: 3000
Plasma volume needed: 3000
Unit division: 0
Unit division: 0
Unit division: 0
Unit division: 0
Unit division: 0
Unit division: 0
Unit division: 0
Unit division: 0
Unit division: 0

## 2019-06-27 ENCOUNTER — Non-Acute Institutional Stay (HOSPITAL_COMMUNITY)
Admission: AD | Admit: 2019-06-27 | Discharge: 2019-06-27 | Disposition: A | Discharge status: Home / Self Care | Payer: Federal, State, Local not specified - PPO | Source: Ambulatory Visit | Attending: Hematology & Oncology | Admitting: Hematology & Oncology

## 2019-06-27 ENCOUNTER — Ambulatory Visit: Payer: Federal, State, Local not specified - PPO | Admitting: Dermatology

## 2019-06-27 DIAGNOSIS — M311 Thrombotic microangiopathy: Secondary | ICD-10-CM | POA: Diagnosis not present

## 2019-06-27 LAB — CBC
HCT: 29.8 % — ABNORMAL LOW (ref 36.0–46.0)
Hemoglobin: 9.8 g/dL — ABNORMAL LOW (ref 12.0–15.0)
MCH: 30.2 pg (ref 26.0–34.0)
MCHC: 32.9 g/dL (ref 30.0–36.0)
MCV: 92 fL (ref 80.0–100.0)
Platelets: 106 10*3/uL — ABNORMAL LOW (ref 150–400)
RBC: 3.24 MIL/uL — ABNORMAL LOW (ref 3.87–5.11)
RDW: 13 % (ref 11.5–15.5)
WBC: 2.4 10*3/uL — ABNORMAL LOW (ref 4.0–10.5)
nRBC: 0 % (ref 0.0–0.2)

## 2019-06-27 LAB — BASIC METABOLIC PANEL
Anion gap: 9 (ref 5–15)
BUN: <5 mg/dL — ABNORMAL LOW (ref 6–20)
CO2: 23 mmol/L (ref 22–32)
Calcium: 9 mg/dL (ref 8.9–10.3)
Chloride: 109 mmol/L (ref 98–111)
Creatinine, Ser: 0.67 mg/dL (ref 0.44–1.00)
GFR calc Af Amer: >60 mL/min (ref >60)
GFR calc non Af Amer: >60 mL/min (ref >60)
Glucose, Bld: 205 mg/dL — ABNORMAL HIGH (ref 70–99)
Potassium: 3.9 mmol/L (ref 3.5–5.1)
Sodium: 141 mmol/L (ref 135–145)

## 2019-06-27 MED ORDER — ACD FORMULA A 0.73-2.45-2.2 GM/100ML VI SOLN
Status: AC
  Administered 2019-06-27: 1000 mL
  Filled 2019-06-27: qty 500

## 2019-06-27 MED ORDER — CALCIUM CARBONATE ANTACID 500 MG PO CHEW
2.0000 | CHEWABLE_TABLET | ORAL | Status: AC
  Administered 2019-06-27: 400 mg via ORAL

## 2019-06-27 MED ORDER — CALCIUM CARBONATE ANTACID 500 MG PO CHEW
CHEWABLE_TABLET | ORAL | Status: AC
  Administered 2019-06-27: 400 mg via ORAL
  Filled 2019-06-27: qty 4

## 2019-06-27 MED ORDER — ACETAMINOPHEN 325 MG PO TABS: 650.0000 mg | ORAL_TABLET | ORAL | Status: DC | PRN

## 2019-06-27 MED ORDER — ACD FORMULA A 0.73-2.45-2.2 GM/100ML VI SOLN: 1000.0000 mL | Status: DC

## 2019-06-27 MED ORDER — DIPHENHYDRAMINE HCL 25 MG PO CAPS: 25.0000 mg | ORAL_CAPSULE | Freq: Four times a day (QID) | ORAL | Status: DC | PRN

## 2019-06-27 MED ORDER — DIPHENHYDRAMINE HCL 25 MG PO CAPS
ORAL_CAPSULE | ORAL | Status: AC
  Administered 2019-06-27: 25 mg via ORAL
  Filled 2019-06-27: qty 1

## 2019-06-27 MED ORDER — ANTICOAGULANT SODIUM CITRATE 4% (200MG/5ML) IV SOLN
5.0000 mL | Freq: Once | Status: AC
  Administered 2019-06-27: 5 mL
  Filled 2019-06-27: qty 5

## 2019-06-27 MED ORDER — CALCIUM GLUCONATE-NACL 2-0.675 GM/100ML-% IV SOLN
2.0000 g | Freq: Once | INTRAVENOUS | Status: AC
  Administered 2019-06-27: 2000 mg via INTRAVENOUS
  Filled 2019-06-27: qty 100

## 2019-06-27 NOTE — Progress Notes (Signed)
Pt accompanied to the main entrance by Delfino Lovett, transporter on a wheelchair. Pt denies pain, n/v, dizziness, paresthesia and weakness.

## 2019-06-28 DIAGNOSIS — H40013 Open angle with borderline findings, low risk, bilateral: Secondary | ICD-10-CM | POA: Diagnosis not present

## 2019-06-28 DIAGNOSIS — H15113 Episcleritis periodica fugax, bilateral: Secondary | ICD-10-CM | POA: Diagnosis not present

## 2019-06-28 DIAGNOSIS — H0102A Squamous blepharitis right eye, upper and lower eyelids: Secondary | ICD-10-CM | POA: Diagnosis not present

## 2019-06-28 DIAGNOSIS — H0102B Squamous blepharitis left eye, upper and lower eyelids: Secondary | ICD-10-CM | POA: Diagnosis not present

## 2019-06-28 LAB — THERAPEUTIC PLASMA EXCHANGE (BLOOD BANK)
Plasma volume needed: 3000
Unit division: 0
Unit division: 0
Unit division: 0
Unit division: 0
Unit division: 0
Unit division: 0
Unit division: 0
Unit division: 0
Unit division: 0
Unit division: 0

## 2019-06-28 LAB — ANAEROBIC AND AEROBIC CULTURE
AER RESULT:: NO GROWTH
MICRO NUMBER:: 10373144
MICRO NUMBER:: 10373145
SPECIMEN QUALITY:: ADEQUATE
SPECIMEN QUALITY:: ADEQUATE

## 2019-06-28 LAB — HM DIABETES EYE EXAM

## 2019-07-01 ENCOUNTER — Non-Acute Institutional Stay (HOSPITAL_COMMUNITY)
Admission: RE | Admit: 2019-07-01 | Discharge: 2019-07-01 | Disposition: A | Discharge status: Home / Self Care | Payer: Federal, State, Local not specified - PPO | Source: Ambulatory Visit | Attending: Hematology & Oncology | Admitting: Hematology & Oncology

## 2019-07-01 ENCOUNTER — Ambulatory Visit (HOSPITAL_COMMUNITY)
Admission: RE | Admit: 2019-07-01 | Payer: Federal, State, Local not specified - PPO | Source: Ambulatory Visit | Admitting: Hematology & Oncology

## 2019-07-01 ENCOUNTER — Telehealth: Payer: Self-pay

## 2019-07-01 DIAGNOSIS — M311 Thrombotic microangiopathy: Secondary | ICD-10-CM | POA: Insufficient documentation

## 2019-07-01 LAB — CBC
HCT: 30.1 % — ABNORMAL LOW (ref 36.0–46.0)
Hemoglobin: 9.9 g/dL — ABNORMAL LOW (ref 12.0–15.0)
MCH: 30.2 pg (ref 26.0–34.0)
MCHC: 32.9 g/dL (ref 30.0–36.0)
MCV: 91.8 fL (ref 80.0–100.0)
Platelets: 109 10*3/uL — ABNORMAL LOW (ref 150–400)
RBC: 3.28 MIL/uL — ABNORMAL LOW (ref 3.87–5.11)
RDW: 13.2 % (ref 11.5–15.5)
WBC: 2.5 10*3/uL — ABNORMAL LOW (ref 4.0–10.5)
nRBC: 0 % (ref 0.0–0.2)

## 2019-07-01 LAB — BASIC METABOLIC PANEL
Anion gap: 7 (ref 5–15)
BUN: 7 mg/dL (ref 6–20)
CO2: 24 mmol/L (ref 22–32)
Calcium: 8.9 mg/dL (ref 8.9–10.3)
Chloride: 108 mmol/L (ref 98–111)
Creatinine, Ser: 0.67 mg/dL (ref 0.44–1.00)
GFR calc Af Amer: >60 mL/min (ref >60)
GFR calc non Af Amer: >60 mL/min (ref >60)
Glucose, Bld: 204 mg/dL — ABNORMAL HIGH (ref 70–99)
Potassium: 3.8 mmol/L (ref 3.5–5.1)
Sodium: 139 mmol/L (ref 135–145)

## 2019-07-01 MED ORDER — ANTICOAGULANT SODIUM CITRATE 4% (200MG/5ML) IV SOLN
5.0000 mL | Freq: Once | Status: AC
  Administered 2019-07-01: 5 mL
  Filled 2019-07-01: qty 5

## 2019-07-01 MED ORDER — ACD FORMULA A 0.73-2.45-2.2 GM/100ML VI SOLN: 1000.0000 mL | Status: DC

## 2019-07-01 MED ORDER — CALCIUM CARBONATE ANTACID 500 MG PO CHEW
2.0000 | CHEWABLE_TABLET | ORAL | Status: AC
  Administered 2019-07-01: 400 mg via ORAL

## 2019-07-01 MED ORDER — CALCIUM CARBONATE ANTACID 500 MG PO CHEW
CHEWABLE_TABLET | ORAL | Status: AC
  Administered 2019-07-01: 400 mg via ORAL
  Filled 2019-07-01: qty 4

## 2019-07-01 MED ORDER — CALCIUM GLUCONATE-NACL 2-0.675 GM/100ML-% IV SOLN
2.0000 g | Freq: Once | INTRAVENOUS | Status: AC
  Administered 2019-07-01: 2000 mg via INTRAVENOUS
  Filled 2019-07-01: qty 100

## 2019-07-01 MED ORDER — ACD FORMULA A 0.73-2.45-2.2 GM/100ML VI SOLN
Status: AC
  Administered 2019-07-01: 1000 mL
  Filled 2019-07-01: qty 500

## 2019-07-01 MED ORDER — DIPHENHYDRAMINE HCL 25 MG PO CAPS: 25.0000 mg | ORAL_CAPSULE | Freq: Four times a day (QID) | ORAL | Status: DC | PRN

## 2019-07-01 MED ORDER — ACETAMINOPHEN 325 MG PO TABS: 650.0000 mg | ORAL_TABLET | ORAL | Status: DC | PRN

## 2019-07-01 MED ORDER — DIPHENHYDRAMINE HCL 25 MG PO CAPS
ORAL_CAPSULE | ORAL | Status: AC
  Administered 2019-07-01: 25 mg via ORAL
  Filled 2019-07-01: qty 1

## 2019-07-01 NOTE — Telephone Encounter (Signed)
-----   Message from Arlyss Gandy, [place] sent at 06/28/2019  4:51 PM EDT ----- Can we call patient and see how she's doing. No need to change or add antibiotic if she's doing better.

## 2019-07-01 NOTE — Telephone Encounter (Signed)
Phone call to patient with Culture results and to check on her per [facility].  Voicemail left for patient to give the office a call back.

## 2019-07-01 NOTE — Progress Notes (Signed)
Pt completed tx without any complications; denies pain, n/v, dizziness, paresthesia or weakness; Pt accompanied to the main entrance by Richard, Transporter.

## 2019-07-02 LAB — THERAPEUTIC PLASMA EXCHANGE (BLOOD BANK)
Plasma Exchange: 3000
Plasma volume needed: 3000
Unit division: 0
Unit division: 0
Unit division: 0
Unit division: 0
Unit division: 0
Unit division: 0

## 2019-07-02 NOTE — Telephone Encounter (Signed)
Patient left message on office voice mail saying she was returning phone call.  Vicki [name] did not say what she was returning call about.  Chart # [id number]

## 2019-07-02 NOTE — Telephone Encounter (Signed)
Phone call to patient to tell her that her culture was negative. Also, asked how she was doing.  Stated she finished the antibiotic yesterday and that she is doing better; however, still has a knot present under the skin. There is no drainage and not as sore.

## 2019-07-02 NOTE — Telephone Encounter (Signed)
Left voicemail to call to see how patient is doing, per JCB's request.

## 2019-07-03 ENCOUNTER — Other Ambulatory Visit: Payer: Self-pay | Admitting: Hematology & Oncology

## 2019-07-03 DIAGNOSIS — K766 Portal hypertension: Secondary | ICD-10-CM | POA: Diagnosis not present

## 2019-07-03 DIAGNOSIS — K7460 Unspecified cirrhosis of liver: Secondary | ICD-10-CM | POA: Diagnosis not present

## 2019-07-03 DIAGNOSIS — I868 Varicose veins of other specified sites: Secondary | ICD-10-CM | POA: Diagnosis not present

## 2019-07-04 ENCOUNTER — Non-Acute Institutional Stay (HOSPITAL_COMMUNITY)
Admission: RE | Admit: 2019-07-04 | Discharge: 2019-07-04 | Disposition: A | Discharge status: Home / Self Care | Payer: Federal, State, Local not specified - PPO | Source: Ambulatory Visit | Attending: Hematology & Oncology | Admitting: Hematology & Oncology

## 2019-07-04 ENCOUNTER — Encounter: Payer: Self-pay | Admitting: Physician Assistant

## 2019-07-04 ENCOUNTER — Other Ambulatory Visit: Payer: Federal, State, Local not specified - PPO

## 2019-07-04 ENCOUNTER — Ambulatory Visit: Payer: Federal, State, Local not specified - PPO | Admitting: Hematology & Oncology

## 2019-07-04 DIAGNOSIS — M311 Thrombotic microangiopathy: Secondary | ICD-10-CM | POA: Diagnosis not present

## 2019-07-04 LAB — BASIC METABOLIC PANEL
Anion gap: 9 (ref 5–15)
BUN: <5 mg/dL — ABNORMAL LOW (ref 6–20)
CO2: 24 mmol/L (ref 22–32)
Calcium: 9.4 mg/dL (ref 8.9–10.3)
Chloride: 105 mmol/L (ref 98–111)
Creatinine, Ser: 0.63 mg/dL (ref 0.44–1.00)
GFR calc Af Amer: >60 mL/min (ref >60)
GFR calc non Af Amer: >60 mL/min (ref >60)
Glucose, Bld: 225 mg/dL — ABNORMAL HIGH (ref 70–99)
Potassium: 3.6 mmol/L (ref 3.5–5.1)
Sodium: 138 mmol/L (ref 135–145)

## 2019-07-04 LAB — CBC
HCT: 30.7 % — ABNORMAL LOW (ref 36.0–46.0)
Hemoglobin: 9.9 g/dL — ABNORMAL LOW (ref 12.0–15.0)
MCH: 29.6 pg (ref 26.0–34.0)
MCHC: 32.2 g/dL (ref 30.0–36.0)
MCV: 91.9 fL (ref 80.0–100.0)
Platelets: 103 10*3/uL — ABNORMAL LOW (ref 150–400)
RBC: 3.34 MIL/uL — ABNORMAL LOW (ref 3.87–5.11)
RDW: 13.5 % (ref 11.5–15.5)
WBC: 2.9 10*3/uL — ABNORMAL LOW (ref 4.0–10.5)
nRBC: 0 % (ref 0.0–0.2)

## 2019-07-04 MED ORDER — CALCIUM CARBONATE ANTACID 500 MG PO CHEW
2.0000 | CHEWABLE_TABLET | ORAL | Status: DC
  Administered 2019-07-04: 400 mg via ORAL

## 2019-07-04 MED ORDER — DIPHENHYDRAMINE HCL 25 MG PO CAPS
ORAL_CAPSULE | ORAL | Status: AC
  Filled 2019-07-04: qty 1

## 2019-07-04 MED ORDER — ANTICOAGULANT SODIUM CITRATE 4% (200MG/5ML) IV SOLN
5.0000 mL | Freq: Once | Status: AC
  Administered 2019-07-04: 5 mL
  Filled 2019-07-04: qty 5

## 2019-07-04 MED ORDER — CALCIUM CARBONATE ANTACID 500 MG PO CHEW
CHEWABLE_TABLET | ORAL | Status: AC
  Filled 2019-07-04: qty 2

## 2019-07-04 MED ORDER — ACD FORMULA A 0.73-2.45-2.2 GM/100ML VI SOLN
1000.0000 mL | Status: DC
  Administered 2019-07-04: 1000 mL

## 2019-07-04 MED ORDER — CALCIUM GLUCONATE-NACL 2-0.675 GM/100ML-% IV SOLN
2.0000 g | Freq: Once | INTRAVENOUS | Status: AC
  Administered 2019-07-04: 2000 mg via INTRAVENOUS
  Filled 2019-07-04 (×2): qty 200
  Filled 2019-07-04: qty 100

## 2019-07-04 MED ORDER — DIPHENHYDRAMINE HCL 25 MG PO CAPS
25.0000 mg | ORAL_CAPSULE | Freq: Four times a day (QID) | ORAL | Status: DC | PRN
  Administered 2019-07-04 (×2): 25 mg via ORAL

## 2019-07-04 MED ORDER — ACETAMINOPHEN 325 MG PO TABS: 650.0000 mg | ORAL_TABLET | ORAL | Status: DC | PRN

## 2019-07-04 NOTE — Progress Notes (Signed)
Pt completed tx without any complications; denies pain, n/v, dizziness, paresthesia or weakness; Pt accompanied to the main entrance by Richard, Transporter, discharge to home

## 2019-07-04 NOTE — Telephone Encounter (Signed)
If it is no longer hot and sore she is ok to just monitor it. It may get smaller with a little more time. If she wants me to recheck it she can schedule.

## 2019-07-05 ENCOUNTER — Ambulatory Visit: Payer: Federal, State, Local not specified - PPO | Admitting: Hematology & Oncology

## 2019-07-05 ENCOUNTER — Other Ambulatory Visit: Payer: Self-pay

## 2019-07-05 ENCOUNTER — Inpatient Hospital Stay (HOSPITAL_BASED_OUTPATIENT_CLINIC_OR_DEPARTMENT_OTHER): Payer: Federal, State, Local not specified - PPO | Admitting: Hematology & Oncology

## 2019-07-05 ENCOUNTER — Encounter: Payer: Self-pay | Admitting: Hematology & Oncology

## 2019-07-05 ENCOUNTER — Inpatient Hospital Stay: Payer: Federal, State, Local not specified - PPO

## 2019-07-05 VITALS — BP 143/75 | HR 88 | Temp 97.7°F | Resp 20 | Wt 155.4 lb

## 2019-07-05 DIAGNOSIS — M3119 Other thrombotic microangiopathy: Secondary | ICD-10-CM

## 2019-07-05 DIAGNOSIS — M311 Thrombotic microangiopathy: Secondary | ICD-10-CM | POA: Diagnosis not present

## 2019-07-05 DIAGNOSIS — K7581 Nonalcoholic steatohepatitis (NASH): Secondary | ICD-10-CM | POA: Diagnosis not present

## 2019-07-05 LAB — THERAPEUTIC PLASMA EXCHANGE (BLOOD BANK)
Plasma volume needed: 3300
Unit division: 0
Unit division: 0
Unit division: 0
Unit division: 0
Unit division: 0
Unit division: 0

## 2019-07-05 LAB — CBC WITH DIFFERENTIAL (CANCER CENTER ONLY)
Abs Immature Granulocytes: 0.01 10*3/uL (ref 0.00–0.07)
Basophils Absolute: 0 10*3/uL (ref 0.0–0.1)
Basophils Relative: 1 %
Eosinophils Absolute: 0.9 10*3/uL — ABNORMAL HIGH (ref 0.0–0.5)
Eosinophils Relative: 25 %
HCT: 30.1 % — ABNORMAL LOW (ref 36.0–46.0)
Hemoglobin: 10 g/dL — ABNORMAL LOW (ref 12.0–15.0)
Immature Granulocytes: 0 %
Lymphocytes Relative: 17 %
Lymphs Abs: 0.6 10*3/uL — ABNORMAL LOW (ref 0.7–4.0)
MCH: 30.5 pg (ref 26.0–34.0)
MCHC: 33.2 g/dL (ref 30.0–36.0)
MCV: 91.8 fL (ref 80.0–100.0)
Monocytes Absolute: 0.3 10*3/uL (ref 0.1–1.0)
Monocytes Relative: 9 %
Neutro Abs: 1.6 10*3/uL — ABNORMAL LOW (ref 1.7–7.7)
Neutrophils Relative %: 48 %
Platelet Count: 102 10*3/uL — ABNORMAL LOW (ref 150–400)
RBC: 3.28 MIL/uL — ABNORMAL LOW (ref 3.87–5.11)
RDW: 13.5 % (ref 11.5–15.5)
WBC Count: 3.4 10*3/uL — ABNORMAL LOW (ref 4.0–10.5)
nRBC: 0 % (ref 0.0–0.2)

## 2019-07-05 LAB — CMP (CANCER CENTER ONLY)
ALT: 28 U/L (ref 0–44)
AST: 51 U/L — ABNORMAL HIGH (ref 15–41)
Albumin: 3.8 g/dL (ref 3.5–5.0)
Alkaline Phosphatase: 76 U/L (ref 38–126)
Anion gap: 5 (ref 5–15)
BUN: 7 mg/dL (ref 6–20)
CO2: 31 mmol/L (ref 22–32)
Calcium: 9.6 mg/dL (ref 8.9–10.3)
Chloride: 105 mmol/L (ref 98–111)
Creatinine: 0.71 mg/dL (ref 0.44–1.00)
GFR, Est AFR Am: >60 mL/min (ref >60)
GFR, Estimated: >60 mL/min (ref >60)
Glucose, Bld: 129 mg/dL — ABNORMAL HIGH (ref 70–99)
Potassium: 4.1 mmol/L (ref 3.5–5.1)
Sodium: 141 mmol/L (ref 135–145)
Total Bilirubin: 0.5 mg/dL (ref 0.3–1.2)
Total Protein: 6.4 g/dL — ABNORMAL LOW (ref 6.5–8.1)

## 2019-07-05 LAB — SAVE SMEAR(SSMR), FOR PROVIDER SLIDE REVIEW

## 2019-07-05 NOTE — Progress Notes (Signed)
Hematology and Oncology Follow Up Visit  Vicki [name] [id number] 1960/05/31 59 y.o. 07/05/2019   Principle Diagnosis:   TTP - acquired -- relapsed  NASH -- leukopenia/thrombocytopenia  Current Therapy:    Plasma Exchange - M-Th --   Rituxan 375 mg/m2 IV weekly -- s/p cycle #4     Interim History:  Vicki [name] is back for her follow-up.  She is still doing quite nicely.  Last time I saw her, which was 3 weeks ago, her  ADAMTS-13 level was >100%.  This is amazing.  We started her treatments for the plasma exchange with a relapse of her TTP, the  ADAMTS-13 level was less than 2%.  Her main problem continues to be the diabetes.  Her blood sugars actually are pretty well controlled today.  I think that the family doctor made some changes with her diabetic regimen.  She is still doing the plasma exchange on Monday and Thursday.  I am going to keep her on this for another couple weeks and then hopefully we can stop it.  She has had some issues with the abscesses.  She had another abscess that was drained.  She has had no nausea or vomiting.  She has had a little bit of pain in the left knee.  She has some tingling in the fingers and toes.  I told her that I thought this was probably neuropathy from the diabetes.  Overall, her performance status right now is ECOG 1.    Medications:  Current Outpatient Medications:  .  Alum Hydroxide-Mag Carbonate (GAVISCON EXTRA STRENGTH PO), Take 1 tablet by mouth 4 (four) times daily as needed., Disp: , Rfl:  .  calcium carbonate (TUMS - DOSED IN MG ELEMENTAL CALCIUM) 500 MG chewable tablet, Chew 2 tablets (400 mg of elemental calcium total) by mouth every 3 (three) hours. (Patient taking differently: Chew 2 tablets by mouth 2 (two) times a week. @ Plasma Exchange.), Disp: 30 tablet, Rfl: 0 .  cholecalciferol (VITAMIN D3) 25 MCG (1000 UT) tablet, Take 1,000 Units by mouth daily., Disp: , Rfl:  .  dextromethorphan-guaiFENesin (MUCINEX DM) 30-600  MG 12hr tablet, Take 2 tablets by mouth 2 (two) times daily as needed for cough., Disp: , Rfl:  .  diphenhydrAMINE (BENADRYL) 50 MG capsule, Take 1 capsule (50 mg total) by mouth daily as needed (on call for plasma exchange)., Disp: 30 capsule, Rfl: 0 .  Dulaglutide (TRULICITY) 4.96 PR/9.1MB SOPN, Inject 0.75 mg into the skin once a week., Disp: 3 mL, Rfl: 3 .  Fexofenadine-Pseudoephedrine (ALLEGRA-D 24 HOUR PO), Take by mouth daily as needed. , Disp: , Rfl:  .  folic acid (FOLVITE) 1 MG tablet, Take 2 tablets (2 mg total) by mouth daily., Disp: 60 tablet, Rfl: 12 .  furosemide (LASIX) 40 MG tablet, Take 1 tablet by mouth once daily, Disp: 30 tablet, Rfl: 0 .  glucose blood test strip, One touch ultra, Disp: , Rfl:  .  Multiple Vitamins-Minerals (MULTI FOR HER 50+) TABS, Take 1 tablet by mouth daily., Disp: , Rfl:  .  mupirocin ointment (BACTROBAN) 2 %, Apply 1 application topically 2 (two) times daily., Disp: 22 g, Rfl: 0 .  potassium chloride (KLOR-CON) 10 MEQ tablet, Take 1 tablet by mouth once daily, Disp: 30 tablet, Rfl: 0 .  senna-docusate (SENOKOT-S) 8.6-50 MG tablet, Take 2 tablets by mouth 2 (two) times daily., Disp: 20 tablet, Rfl: 0 .  sitaGLIPtin (JANUVIA) 100 MG tablet, Take 1 tablet (100 mg total) by  mouth daily., Disp: 90 tablet, Rfl: 0  Allergies:  Allergies  Allergen Reactions  . Metformin And Related Itching    Heart palpitation  . Sulfa Antibiotics Itching  . Sulfasalazine Itching    Past Medical History, Surgical history, Social history, and Family History were reviewed and updated.  Review of Systems: Review of Systems  Constitutional: Negative.   HENT:  Negative.   Eyes: Negative.   Respiratory: Negative.   Cardiovascular: Positive for leg swelling.  Gastrointestinal: Negative.   Endocrine: Negative.   Genitourinary: Negative.    Musculoskeletal: Positive for arthralgias and myalgias.  Skin: Negative.   Neurological: Negative.   Hematological: Negative.    Psychiatric/Behavioral: Negative.     Physical Exam:  weight is 155 lb 6.4 oz (70.5 kg). Her temporal temperature is 97.7 F (36.5 C). Her blood pressure is 143/75 (abnormal) and her pulse is 88. Her respiration is 20 and oxygen saturation is 100%.   Wt Readings from Last 3 Encounters:  07/05/19 155 lb 6.4 oz (70.5 kg)  07/04/19 154 lb 8.7 oz (70.1 kg)  06/18/19 149 lb (67.6 kg)    Physical Exam Vitals reviewed.  HENT:     Head: Normocephalic and atraumatic.  Eyes:     Pupils: Pupils are equal, round, and reactive to light.  Cardiovascular:     Rate and Rhythm: Normal rate and regular rhythm.     Heart sounds: Normal heart sounds.  Pulmonary:     Effort: Pulmonary effort is normal.     Breath sounds: Normal breath sounds.  Abdominal:     General: Bowel sounds are normal.     Palpations: Abdomen is soft.  Musculoskeletal:        General: No tenderness or deformity. Normal range of motion.     Cervical back: Normal range of motion.     Comments: Extremities shows some 1+ edema in her lower legs.  She has good range of motion of her joints.  She has good strength in upper and lower extremities.  Lymphadenopathy:     Cervical: No cervical adenopathy.  Skin:    General: Skin is warm and dry.     Findings: No erythema or rash.  Neurological:     Mental Status: She is alert and oriented to person, place, and time.  Psychiatric:        Behavior: Behavior normal.        Thought Content: Thought content normal.        Judgment: Judgment normal.      Lab Results  Component Value Date   WBC 3.4 (L) 07/05/2019   HGB 10.0 (L) 07/05/2019   HCT 30.1 (L) 07/05/2019   MCV 91.8 07/05/2019   PLT 102 (L) 07/05/2019     Chemistry      Component Value Date/Time   NA 141 07/05/2019 1317   K 4.1 07/05/2019 1317   CL 105 07/05/2019 1317   CO2 31 07/05/2019 1317   BUN 7 07/05/2019 1317   CREATININE 0.71 07/05/2019 1317   CREATININE 0.70 09/25/2015 1504      Component Value  Date/Time   CALCIUM 9.6 07/05/2019 1317   ALKPHOS 76 07/05/2019 1317   AST 51 (H) 07/05/2019 1317   ALT 28 07/05/2019 1317   BILITOT 0.5 07/05/2019 1317       Impression and Plan: Vicki [name] is a 59 year old African-American female.  She has acquired TTP.  She had relapsed.  She is responding incredibly well.  Her platelet count today is  103,000.   Again, her platelet count will never be normal secondary to the NASH.  We will see what her LDH is.  We will continue the plasma exchange every Monday and Thursday for the next couple weeks.  Hopefully, the ADAMTS-13 will stay elevated.  This should be a good indicator that the TTP is at a very low level or in remission.  We will now plan to get her back in about 3 weeks.  As always, we had an excellent prayer session.   Volanda Napoleon, MD 4/30/20212:12 PM

## 2019-07-07 LAB — ADAMTS13 ACTIVITY REFLEX

## 2019-07-07 LAB — ADAMTS13 ACTIVITY: Adamts 13 Activity: 99.5 % (ref >66.8)

## 2019-07-08 ENCOUNTER — Non-Acute Institutional Stay (HOSPITAL_COMMUNITY)
Admission: AD | Admit: 2019-07-08 | Discharge: 2019-07-08 | Disposition: A | Discharge status: Home / Self Care | Payer: Federal, State, Local not specified - PPO | Source: Ambulatory Visit | Attending: Hematology & Oncology | Admitting: Hematology & Oncology

## 2019-07-08 DIAGNOSIS — M311 Thrombotic microangiopathy: Secondary | ICD-10-CM | POA: Insufficient documentation

## 2019-07-08 LAB — CBC
HCT: 28.9 % — ABNORMAL LOW (ref 36.0–46.0)
Hemoglobin: 9.5 g/dL — ABNORMAL LOW (ref 12.0–15.0)
MCH: 30.5 pg (ref 26.0–34.0)
MCHC: 32.9 g/dL (ref 30.0–36.0)
MCV: 92.9 fL (ref 80.0–100.0)
Platelets: 60 10*3/uL — ABNORMAL LOW (ref 150–400)
RBC: 3.11 MIL/uL — ABNORMAL LOW (ref 3.87–5.11)
RDW: 13.4 % (ref 11.5–15.5)
WBC: 2.7 10*3/uL — ABNORMAL LOW (ref 4.0–10.5)
nRBC: 0 % (ref 0.0–0.2)

## 2019-07-08 LAB — BASIC METABOLIC PANEL
Anion gap: 7 (ref 5–15)
BUN: 8 mg/dL (ref 6–20)
CO2: 26 mmol/L (ref 22–32)
Calcium: 9.1 mg/dL (ref 8.9–10.3)
Chloride: 107 mmol/L (ref 98–111)
Creatinine, Ser: 0.67 mg/dL (ref 0.44–1.00)
GFR calc Af Amer: >60 mL/min (ref >60)
GFR calc non Af Amer: >60 mL/min (ref >60)
Glucose, Bld: 172 mg/dL — ABNORMAL HIGH (ref 70–99)
Potassium: 3.9 mmol/L (ref 3.5–5.1)
Sodium: 140 mmol/L (ref 135–145)

## 2019-07-08 LAB — LACTATE DEHYDROGENASE: LDH: 194 U/L — ABNORMAL HIGH (ref 98–192)

## 2019-07-08 MED ORDER — ANTICOAGULANT SODIUM CITRATE 4% (200MG/5ML) IV SOLN
5.0000 mL | Freq: Once | Status: AC
  Administered 2019-07-08: 5 mL
  Filled 2019-07-08: qty 5

## 2019-07-08 MED ORDER — CALCIUM CARBONATE ANTACID 500 MG PO CHEW
CHEWABLE_TABLET | ORAL | Status: AC
  Filled 2019-07-08: qty 2

## 2019-07-08 MED ORDER — DIPHENHYDRAMINE HCL 25 MG PO CAPS
ORAL_CAPSULE | ORAL | Status: AC
  Filled 2019-07-08: qty 1

## 2019-07-08 MED ORDER — ACD FORMULA A 0.73-2.45-2.2 GM/100ML VI SOLN: 1000.0000 mL | Status: DC

## 2019-07-08 MED ORDER — CALCIUM CARBONATE ANTACID 500 MG PO CHEW
2.0000 | CHEWABLE_TABLET | ORAL | Status: DC
  Administered 2019-07-08: 400 mg via ORAL

## 2019-07-08 MED ORDER — ACETAMINOPHEN 325 MG PO TABS: 650.0000 mg | ORAL_TABLET | ORAL | Status: DC | PRN

## 2019-07-08 MED ORDER — DIPHENHYDRAMINE HCL 25 MG PO CAPS
25.0000 mg | ORAL_CAPSULE | Freq: Four times a day (QID) | ORAL | Status: DC | PRN
  Administered 2019-07-08: 25 mg via ORAL

## 2019-07-08 MED ORDER — CALCIUM GLUCONATE-NACL 2-0.675 GM/100ML-% IV SOLN
2.0000 g | Freq: Once | INTRAVENOUS | Status: AC
  Administered 2019-07-08: 2000 mg via INTRAVENOUS
  Filled 2019-07-08 (×2): qty 100

## 2019-07-08 NOTE — Progress Notes (Signed)
Pt completed tx without any complications; denies pain, n/v, dizziness, paresthesia or weakness; Pt accompanied to the main entrance by Richard, Transporter, discharge to home

## 2019-07-09 ENCOUNTER — Telehealth: Payer: Self-pay | Admitting: *Deleted

## 2019-07-09 LAB — THERAPEUTIC PLASMA EXCHANGE (BLOOD BANK)
Plasma Exchange: 2800
Plasma volume needed: 2800
Unit division: 0
Unit division: 0
Unit division: 0
Unit division: 0
Unit division: 0
Unit division: 0

## 2019-07-09 NOTE — Telephone Encounter (Signed)
Message received from patient to inform Dr. Marin Olp that her platelet count was 60 after phoresis on 07/08/19.  Dr. Marin Olp notified.  Call placed back to patient and message left to notify patient that Dr. Marin Olp does not want to change anything at this time.  Instructed pt to call office back with any questions or concerns.

## 2019-07-09 NOTE — Telephone Encounter (Signed)
Left message for patient with JCB's recommendations. Told her if she wants to schedule an appointment she can call.

## 2019-07-11 ENCOUNTER — Ambulatory Visit (HOSPITAL_COMMUNITY)
Admission: RE | Admit: 2019-07-11 | Payer: Federal, State, Local not specified - PPO | Source: Ambulatory Visit | Admitting: Hematology & Oncology

## 2019-07-11 ENCOUNTER — Non-Acute Institutional Stay (HOSPITAL_COMMUNITY)
Admission: RE | Admit: 2019-07-11 | Discharge: 2019-07-11 | Disposition: A | Discharge status: Home / Self Care | Payer: Federal, State, Local not specified - PPO | Source: Ambulatory Visit | Attending: Hematology & Oncology | Admitting: Hematology & Oncology

## 2019-07-11 DIAGNOSIS — M311 Thrombotic microangiopathy: Secondary | ICD-10-CM | POA: Diagnosis not present

## 2019-07-11 LAB — CBC WITH DIFFERENTIAL/PLATELET
Abs Immature Granulocytes: 0.01 10*3/uL (ref 0.00–0.07)
Basophils Absolute: 0 10*3/uL (ref 0.0–0.1)
Basophils Relative: 0 %
Eosinophils Absolute: 1.4 10*3/uL — ABNORMAL HIGH (ref 0.0–0.5)
Eosinophils Relative: 44 %
HCT: 28.2 % — ABNORMAL LOW (ref 36.0–46.0)
Hemoglobin: 9.2 g/dL — ABNORMAL LOW (ref 12.0–15.0)
Immature Granulocytes: 0 %
Lymphocytes Relative: 17 %
Lymphs Abs: 0.5 10*3/uL — ABNORMAL LOW (ref 0.7–4.0)
MCH: 30.4 pg (ref 26.0–34.0)
MCHC: 32.6 g/dL (ref 30.0–36.0)
MCV: 93.1 fL (ref 80.0–100.0)
Monocytes Absolute: 0.3 10*3/uL (ref 0.1–1.0)
Monocytes Relative: 8 %
Neutro Abs: 1 10*3/uL — ABNORMAL LOW (ref 1.7–7.7)
Neutrophils Relative %: 31 %
Platelets: 82 10*3/uL — ABNORMAL LOW (ref 150–400)
RBC: 3.03 MIL/uL — ABNORMAL LOW (ref 3.87–5.11)
RDW: 13.6 % (ref 11.5–15.5)
WBC: 3.2 10*3/uL — ABNORMAL LOW (ref 4.0–10.5)
nRBC: 0 % (ref 0.0–0.2)

## 2019-07-11 LAB — BASIC METABOLIC PANEL
Anion gap: 9 (ref 5–15)
BUN: 8 mg/dL (ref 6–20)
CO2: 21 mmol/L — ABNORMAL LOW (ref 22–32)
Calcium: 8.8 mg/dL — ABNORMAL LOW (ref 8.9–10.3)
Chloride: 102 mmol/L (ref 98–111)
Creatinine, Ser: 0.68 mg/dL (ref 0.44–1.00)
GFR calc Af Amer: >60 mL/min (ref >60)
GFR calc non Af Amer: >60 mL/min (ref >60)
Glucose, Bld: 179 mg/dL — ABNORMAL HIGH (ref 70–99)
Potassium: 3.4 mmol/L — ABNORMAL LOW (ref 3.5–5.1)
Sodium: 132 mmol/L — ABNORMAL LOW (ref 135–145)

## 2019-07-11 MED ORDER — ACD FORMULA A 0.73-2.45-2.2 GM/100ML VI SOLN: 1000.0000 mL | Status: DC

## 2019-07-11 MED ORDER — CALCIUM CARBONATE ANTACID 500 MG PO CHEW: 2.0000 | CHEWABLE_TABLET | ORAL | Status: DC

## 2019-07-11 MED ORDER — ANTICOAGULANT SODIUM CITRATE 4% (200MG/5ML) IV SOLN
5.0000 mL | Freq: Once | Status: DC
  Filled 2019-07-11: qty 5

## 2019-07-11 MED ORDER — CALCIUM GLUCONATE-NACL 2-0.675 GM/100ML-% IV SOLN
2.0000 g | Freq: Once | INTRAVENOUS | Status: DC
  Filled 2019-07-11: qty 100

## 2019-07-11 MED ORDER — CALCIUM CARBONATE ANTACID 500 MG PO CHEW
CHEWABLE_TABLET | ORAL | Status: AC
  Administered 2019-07-11: 400 mg via ORAL
  Filled 2019-07-11: qty 4

## 2019-07-11 MED ORDER — ACETAMINOPHEN 325 MG PO TABS: 650.0000 mg | ORAL_TABLET | ORAL | Status: DC | PRN

## 2019-07-11 MED ORDER — ACD FORMULA A 0.73-2.45-2.2 GM/100ML VI SOLN
Status: AC
  Administered 2019-07-11: 1000 mL
  Filled 2019-07-11: qty 500

## 2019-07-11 MED ORDER — DIPHENHYDRAMINE HCL 25 MG PO CAPS: 25.0000 mg | ORAL_CAPSULE | Freq: Four times a day (QID) | ORAL | Status: DC | PRN

## 2019-07-11 MED ORDER — DIPHENHYDRAMINE HCL 25 MG PO CAPS
ORAL_CAPSULE | ORAL | Status: AC
  Administered 2019-07-11: 25 mg via ORAL
  Filled 2019-07-11: qty 1

## 2019-07-11 NOTE — Progress Notes (Signed)
tx completed without complications; pt denies pain n/v, paresthesia, wekaness or dizziness. Pt accompanied to the main entrance by Richard, Transporter.

## 2019-07-12 LAB — THERAPEUTIC PLASMA EXCHANGE (BLOOD BANK)
Plasma Exchange: 3005
Plasma volume needed: 3000
Unit division: 0
Unit division: 0
Unit division: 0
Unit division: 0

## 2019-07-15 ENCOUNTER — Non-Acute Institutional Stay (HOSPITAL_COMMUNITY)
Admission: AD | Admit: 2019-07-15 | Discharge: 2019-07-15 | Disposition: A | Discharge status: Home / Self Care | Payer: Federal, State, Local not specified - PPO | Source: Ambulatory Visit | Attending: Hematology & Oncology | Admitting: Hematology & Oncology

## 2019-07-15 DIAGNOSIS — M311 Thrombotic microangiopathy: Secondary | ICD-10-CM | POA: Insufficient documentation

## 2019-07-15 LAB — CBC
HCT: 28.3 % — ABNORMAL LOW (ref 36.0–46.0)
Hemoglobin: 9 g/dL — ABNORMAL LOW (ref 12.0–15.0)
MCH: 29.8 pg (ref 26.0–34.0)
MCHC: 31.8 g/dL (ref 30.0–36.0)
MCV: 93.7 fL (ref 80.0–100.0)
Platelets: 96 10*3/uL — ABNORMAL LOW (ref 150–400)
RBC: 3.02 MIL/uL — ABNORMAL LOW (ref 3.87–5.11)
RDW: 14.1 % (ref 11.5–15.5)
WBC: 3.5 10*3/uL — ABNORMAL LOW (ref 4.0–10.5)
nRBC: 0 % (ref 0.0–0.2)

## 2019-07-15 LAB — BASIC METABOLIC PANEL
Anion gap: 6 (ref 5–15)
BUN: 5 mg/dL — ABNORMAL LOW (ref 6–20)
CO2: 24 mmol/L (ref 22–32)
Calcium: 9.1 mg/dL (ref 8.9–10.3)
Chloride: 110 mmol/L (ref 98–111)
Creatinine, Ser: 0.66 mg/dL (ref 0.44–1.00)
GFR calc Af Amer: >60 mL/min (ref >60)
GFR calc non Af Amer: >60 mL/min (ref >60)
Glucose, Bld: 180 mg/dL — ABNORMAL HIGH (ref 70–99)
Potassium: 3.9 mmol/L (ref 3.5–5.1)
Sodium: 140 mmol/L (ref 135–145)

## 2019-07-15 MED ORDER — CALCIUM CARBONATE ANTACID 500 MG PO CHEW
CHEWABLE_TABLET | ORAL | Status: AC
  Administered 2019-07-15: 400 mg
  Filled 2019-07-15: qty 2

## 2019-07-15 MED ORDER — DIPHENHYDRAMINE HCL 25 MG PO CAPS: 25.0000 mg | ORAL_CAPSULE | Freq: Four times a day (QID) | ORAL | Status: DC | PRN

## 2019-07-15 MED ORDER — CALCIUM CARBONATE ANTACID 500 MG PO CHEW: 2.0000 | CHEWABLE_TABLET | ORAL | Status: DC

## 2019-07-15 MED ORDER — ACD FORMULA A 0.73-2.45-2.2 GM/100ML VI SOLN: 1000.0000 mL | Status: DC

## 2019-07-15 MED ORDER — DIPHENHYDRAMINE HCL 25 MG PO CAPS
ORAL_CAPSULE | ORAL | Status: AC
  Administered 2019-07-15: 25 mg
  Filled 2019-07-15: qty 1

## 2019-07-15 MED ORDER — ANTICOAGULANT SODIUM CITRATE 4% (200MG/5ML) IV SOLN
5.0000 mL | Freq: Once | Status: DC
  Filled 2019-07-15 (×2): qty 5

## 2019-07-15 MED ORDER — CALCIUM GLUCONATE-NACL 2-0.675 GM/100ML-% IV SOLN
2.0000 g | Freq: Once | INTRAVENOUS | Status: DC
  Filled 2019-07-15 (×2): qty 100

## 2019-07-15 MED ORDER — ACETAMINOPHEN 325 MG PO TABS: 650.0000 mg | ORAL_TABLET | ORAL | Status: DC | PRN

## 2019-07-15 NOTE — Progress Notes (Signed)
Pt completed tx without any complications; denies pain, n/v, dizziness, paresthesia or weakness; Pt accompanied to the main entrance by Richard, Transporter, discharge to home

## 2019-07-16 LAB — THERAPEUTIC PLASMA EXCHANGE (BLOOD BANK)
Plasma Exchange: 3000
Plasma volume needed: 3000
Unit division: 0
Unit division: 0
Unit division: 0
Unit division: 0
Unit division: 0
Unit division: 0
Unit division: 0
Unit division: 0
Unit division: 0

## 2019-07-18 ENCOUNTER — Non-Acute Institutional Stay (HOSPITAL_COMMUNITY)
Admission: AD | Admit: 2019-07-18 | Discharge: 2019-07-18 | Disposition: A | Discharge status: Home / Self Care | Payer: Federal, State, Local not specified - PPO | Source: Ambulatory Visit | Attending: Hematology & Oncology | Admitting: Hematology & Oncology

## 2019-07-18 DIAGNOSIS — M311 Thrombotic microangiopathy: Secondary | ICD-10-CM | POA: Insufficient documentation

## 2019-07-18 LAB — BASIC METABOLIC PANEL
Anion gap: 9 (ref 5–15)
BUN: 6 mg/dL (ref 6–20)
CO2: 25 mmol/L (ref 22–32)
Calcium: 9.1 mg/dL (ref 8.9–10.3)
Chloride: 105 mmol/L (ref 98–111)
Creatinine, Ser: 0.66 mg/dL (ref 0.44–1.00)
GFR calc Af Amer: >60 mL/min (ref >60)
GFR calc non Af Amer: >60 mL/min (ref >60)
Glucose, Bld: 140 mg/dL — ABNORMAL HIGH (ref 70–99)
Potassium: 3.8 mmol/L (ref 3.5–5.1)
Sodium: 139 mmol/L (ref 135–145)

## 2019-07-18 LAB — CBC WITH DIFFERENTIAL/PLATELET
Abs Immature Granulocytes: 0.01 10*3/uL (ref 0.00–0.07)
Basophils Absolute: 0 10*3/uL (ref 0.0–0.1)
Basophils Relative: 0 %
Eosinophils Absolute: 1.2 10*3/uL — ABNORMAL HIGH (ref 0.0–0.5)
Eosinophils Relative: 38 %
HCT: 27.4 % — ABNORMAL LOW (ref 36.0–46.0)
Hemoglobin: 8.8 g/dL — ABNORMAL LOW (ref 12.0–15.0)
Immature Granulocytes: 0 %
Lymphocytes Relative: 16 %
Lymphs Abs: 0.5 10*3/uL — ABNORMAL LOW (ref 0.7–4.0)
MCH: 29.8 pg (ref 26.0–34.0)
MCHC: 32.1 g/dL (ref 30.0–36.0)
MCV: 92.9 fL (ref 80.0–100.0)
Monocytes Absolute: 0.3 10*3/uL (ref 0.1–1.0)
Monocytes Relative: 8 %
Neutro Abs: 1.2 10*3/uL — ABNORMAL LOW (ref 1.7–7.7)
Neutrophils Relative %: 38 %
Platelets: 96 10*3/uL — ABNORMAL LOW (ref 150–400)
RBC: 2.95 MIL/uL — ABNORMAL LOW (ref 3.87–5.11)
RDW: 14 % (ref 11.5–15.5)
WBC: 3.2 10*3/uL — ABNORMAL LOW (ref 4.0–10.5)
nRBC: 0 % (ref 0.0–0.2)

## 2019-07-18 MED ORDER — DIPHENHYDRAMINE HCL 25 MG PO CAPS
ORAL_CAPSULE | ORAL | Status: AC
  Administered 2019-07-18: 25 mg via ORAL
  Filled 2019-07-18: qty 1

## 2019-07-18 MED ORDER — CALCIUM CARBONATE ANTACID 500 MG PO CHEW
2.0000 | CHEWABLE_TABLET | ORAL | Status: AC
  Administered 2019-07-18: 400 mg via ORAL

## 2019-07-18 MED ORDER — ACETAMINOPHEN 325 MG PO TABS: 650.0000 mg | ORAL_TABLET | ORAL | Status: DC | PRN

## 2019-07-18 MED ORDER — CALCIUM GLUCONATE-NACL 2-0.675 GM/100ML-% IV SOLN
2.0000 g | Freq: Once | INTRAVENOUS | Status: AC
  Administered 2019-07-18: 2000 mg via INTRAVENOUS
  Filled 2019-07-18: qty 100

## 2019-07-18 MED ORDER — ACD FORMULA A 0.73-2.45-2.2 GM/100ML VI SOLN
Status: AC
  Administered 2019-07-18: 1000 mL
  Filled 2019-07-18: qty 500

## 2019-07-18 MED ORDER — ANTICOAGULANT SODIUM CITRATE 4% (200MG/5ML) IV SOLN
5.0000 mL | Freq: Once | Status: AC
  Administered 2019-07-18: 5 mL
  Filled 2019-07-18 (×2): qty 5

## 2019-07-18 MED ORDER — ACD FORMULA A 0.73-2.45-2.2 GM/100ML VI SOLN: 1000.0000 mL | Status: DC

## 2019-07-18 MED ORDER — DIPHENHYDRAMINE HCL 25 MG PO CAPS: 25.0000 mg | ORAL_CAPSULE | Freq: Four times a day (QID) | ORAL | Status: DC | PRN

## 2019-07-18 MED ORDER — CALCIUM CARBONATE ANTACID 500 MG PO CHEW
CHEWABLE_TABLET | ORAL | Status: AC
  Administered 2019-07-18: 400 mg via ORAL
  Filled 2019-07-18: qty 4

## 2019-07-18 NOTE — Progress Notes (Signed)
Pt tolerated TPE well; denies pain dizzness, pain, n/v, weakness or paresthesia. Pt transported off the unit by Delfino Lovett, transporter. Patient made aware that her next tx will be 07/22/2019.

## 2019-07-19 LAB — THERAPEUTIC PLASMA EXCHANGE (BLOOD BANK)
Plasma Exchange: 3000
Plasma volume needed: 3000
Unit division: 0
Unit division: 0
Unit division: 0

## 2019-07-22 ENCOUNTER — Non-Acute Institutional Stay (HOSPITAL_COMMUNITY)
Admission: AD | Admit: 2019-07-22 | Discharge: 2019-07-22 | Disposition: A | Discharge status: Home / Self Care | Payer: Federal, State, Local not specified - PPO | Source: Ambulatory Visit | Attending: Hematology & Oncology | Admitting: Hematology & Oncology

## 2019-07-22 DIAGNOSIS — M311 Thrombotic microangiopathy: Secondary | ICD-10-CM | POA: Insufficient documentation

## 2019-07-22 LAB — CBC
HCT: 29.4 % — ABNORMAL LOW (ref 36.0–46.0)
Hemoglobin: 9.2 g/dL — ABNORMAL LOW (ref 12.0–15.0)
MCH: 30.1 pg (ref 26.0–34.0)
MCHC: 31.3 g/dL (ref 30.0–36.0)
MCV: 96.1 fL (ref 80.0–100.0)
Platelets: 100 10*3/uL — ABNORMAL LOW (ref 150–400)
RBC: 3.06 MIL/uL — ABNORMAL LOW (ref 3.87–5.11)
RDW: 14.1 % (ref 11.5–15.5)
WBC: 2.7 10*3/uL — ABNORMAL LOW (ref 4.0–10.5)
nRBC: 0 % (ref 0.0–0.2)

## 2019-07-22 LAB — BASIC METABOLIC PANEL
Anion gap: 11 (ref 5–15)
BUN: 8 mg/dL (ref 6–20)
CO2: 23 mmol/L (ref 22–32)
Calcium: 8.5 mg/dL — ABNORMAL LOW (ref 8.9–10.3)
Chloride: 108 mmol/L (ref 98–111)
Creatinine, Ser: 0.73 mg/dL (ref 0.44–1.00)
GFR calc Af Amer: >60 mL/min (ref >60)
GFR calc non Af Amer: >60 mL/min (ref >60)
Glucose, Bld: 165 mg/dL — ABNORMAL HIGH (ref 70–99)
Potassium: 3.7 mmol/L (ref 3.5–5.1)
Sodium: 142 mmol/L (ref 135–145)

## 2019-07-22 MED ORDER — DIPHENHYDRAMINE HCL 25 MG PO CAPS
ORAL_CAPSULE | ORAL | Status: AC
  Filled 2019-07-22: qty 1

## 2019-07-22 MED ORDER — CALCIUM CARBONATE ANTACID 500 MG PO CHEW
CHEWABLE_TABLET | ORAL | Status: AC
  Administered 2019-07-22: 400 mg via ORAL
  Filled 2019-07-22: qty 2

## 2019-07-22 MED ORDER — ACD FORMULA A 0.73-2.45-2.2 GM/100ML VI SOLN
1000.0000 mL | Status: DC
  Administered 2019-07-22: 1000 mL

## 2019-07-22 MED ORDER — ANTICOAGULANT SODIUM CITRATE 4% (200MG/5ML) IV SOLN
5.0000 mL | Freq: Once | Status: AC
  Administered 2019-07-22: 5 mL
  Filled 2019-07-22: qty 5

## 2019-07-22 MED ORDER — DIPHENHYDRAMINE HCL 25 MG PO CAPS
25.0000 mg | ORAL_CAPSULE | Freq: Four times a day (QID) | ORAL | Status: DC | PRN
  Administered 2019-07-22: 25 mg via ORAL

## 2019-07-22 MED ORDER — CALCIUM CARBONATE ANTACID 500 MG PO CHEW
2.0000 | CHEWABLE_TABLET | ORAL | Status: AC
  Administered 2019-07-22: 400 mg via ORAL

## 2019-07-22 MED ORDER — ACETAMINOPHEN 325 MG PO TABS: 650.0000 mg | ORAL_TABLET | ORAL | Status: DC | PRN

## 2019-07-22 MED ORDER — CALCIUM GLUCONATE-NACL 2-0.675 GM/100ML-% IV SOLN
2.0000 g | Freq: Once | INTRAVENOUS | Status: AC
  Administered 2019-07-22: 2000 mg via INTRAVENOUS
  Filled 2019-07-22: qty 100

## 2019-07-25 ENCOUNTER — Non-Acute Institutional Stay (HOSPITAL_COMMUNITY)
Admission: AD | Admit: 2019-07-25 | Discharge: 2019-07-25 | Disposition: A | Discharge status: Home / Self Care | Payer: Federal, State, Local not specified - PPO | Source: Ambulatory Visit | Attending: Hematology & Oncology | Admitting: Hematology & Oncology

## 2019-07-25 DIAGNOSIS — M311 Thrombotic microangiopathy: Secondary | ICD-10-CM | POA: Insufficient documentation

## 2019-07-25 LAB — THERAPEUTIC PLASMA EXCHANGE (BLOOD BANK)
Plasma Exchange: 3000
Plasma volume needed: 3000
Unit division: 0
Unit division: 0
Unit division: 0
Unit division: 0
Unit division: 0
Unit division: 0

## 2019-07-25 LAB — POCT I-STAT, CHEM 8
BUN: 9 mg/dL (ref 6–20)
Calcium, Ion: 1.23 mmol/L (ref 1.15–1.40)
Chloride: 104 mmol/L (ref 98–111)
Creatinine, Ser: 0.8 mg/dL (ref 0.44–1.00)
Glucose, Bld: 133 mg/dL — ABNORMAL HIGH (ref 70–99)
HCT: 29 % — ABNORMAL LOW (ref 36.0–46.0)
Hemoglobin: 9.9 g/dL — ABNORMAL LOW (ref 12.0–15.0)
Potassium: 3.8 mmol/L (ref 3.5–5.1)
Sodium: 142 mmol/L (ref 135–145)
TCO2: 25 mmol/L (ref 22–32)

## 2019-07-25 MED ORDER — ACD FORMULA A 0.73-2.45-2.2 GM/100ML VI SOLN
1000.0000 mL | Status: DC
  Administered 2019-07-25: 1000 mL

## 2019-07-25 MED ORDER — DIPHENHYDRAMINE HCL 25 MG PO CAPS: 25.0000 mg | ORAL_CAPSULE | Freq: Four times a day (QID) | ORAL | Status: DC | PRN

## 2019-07-25 MED ORDER — CALCIUM CARBONATE ANTACID 500 MG PO CHEW
CHEWABLE_TABLET | ORAL | Status: AC
  Filled 2019-07-25: qty 2

## 2019-07-25 MED ORDER — ACETAMINOPHEN 325 MG PO TABS: 650.0000 mg | ORAL_TABLET | ORAL | Status: DC | PRN

## 2019-07-25 MED ORDER — ANTICOAGULANT SODIUM CITRATE 4% (200MG/5ML) IV SOLN
5.0000 mL | Freq: Once | Status: AC
  Administered 2019-07-25: 5 mL
  Filled 2019-07-25 (×2): qty 5

## 2019-07-25 MED ORDER — CALCIUM GLUCONATE-NACL 2-0.675 GM/100ML-% IV SOLN
2.0000 g | Freq: Once | INTRAVENOUS | Status: AC
  Administered 2019-07-25: 2000 mg via INTRAVENOUS
  Filled 2019-07-25 (×2): qty 100

## 2019-07-25 MED ORDER — CALCIUM CARBONATE ANTACID 500 MG PO CHEW
2.0000 | CHEWABLE_TABLET | ORAL | Status: DC
  Administered 2019-07-25: 400 mg via ORAL

## 2019-07-25 MED ORDER — ACD FORMULA A 0.73-2.45-2.2 GM/100ML VI SOLN
Status: AC
  Filled 2019-07-25: qty 500

## 2019-07-25 NOTE — Progress Notes (Signed)
TPE treatment complete without complications.  VSS post treatment and patient without complaint.  Patient aware of her next outpatient appointment on Monday. May 24 @ 0700.  Patient being discharged to home with self care.

## 2019-07-26 LAB — THERAPEUTIC PLASMA EXCHANGE (BLOOD BANK)
Plasma Exchange: 3000
Plasma volume needed: 3000
Unit division: 0
Unit division: 0
Unit division: 0
Unit division: 0
Unit division: 0
Unit division: 0
Unit division: 0
Unit division: 0

## 2019-07-29 ENCOUNTER — Non-Acute Institutional Stay (HOSPITAL_COMMUNITY)
Admission: AD | Admit: 2019-07-29 | Discharge: 2019-07-29 | Disposition: A | Discharge status: Home / Self Care | Payer: Federal, State, Local not specified - PPO | Source: Ambulatory Visit | Attending: Hematology & Oncology | Admitting: Hematology & Oncology

## 2019-07-29 DIAGNOSIS — M311 Thrombotic microangiopathy: Secondary | ICD-10-CM | POA: Diagnosis not present

## 2019-07-29 LAB — BASIC METABOLIC PANEL
Anion gap: 9 (ref 5–15)
BUN: 6 mg/dL (ref 6–20)
CO2: 24 mmol/L (ref 22–32)
Calcium: 8.9 mg/dL (ref 8.9–10.3)
Chloride: 107 mmol/L (ref 98–111)
Creatinine, Ser: 0.66 mg/dL (ref 0.44–1.00)
GFR calc Af Amer: >60 mL/min (ref >60)
GFR calc non Af Amer: >60 mL/min (ref >60)
Glucose, Bld: 150 mg/dL — ABNORMAL HIGH (ref 70–99)
Potassium: 3.8 mmol/L (ref 3.5–5.1)
Sodium: 140 mmol/L (ref 135–145)

## 2019-07-29 LAB — CBC
HCT: 28.8 % — ABNORMAL LOW (ref 36.0–46.0)
Hemoglobin: 9.4 g/dL — ABNORMAL LOW (ref 12.0–15.0)
MCH: 30 pg (ref 26.0–34.0)
MCHC: 32.6 g/dL (ref 30.0–36.0)
MCV: 92 fL (ref 80.0–100.0)
Platelets: 92 10*3/uL — ABNORMAL LOW (ref 150–400)
RBC: 3.13 MIL/uL — ABNORMAL LOW (ref 3.87–5.11)
RDW: 13.9 % (ref 11.5–15.5)
WBC: 3.2 10*3/uL — ABNORMAL LOW (ref 4.0–10.5)
nRBC: 0 % (ref 0.0–0.2)

## 2019-07-29 MED ORDER — CALCIUM CARBONATE ANTACID 500 MG PO CHEW
2.0000 | CHEWABLE_TABLET | ORAL | Status: AC
  Administered 2019-07-29: 400 mg via ORAL

## 2019-07-29 MED ORDER — ACETAMINOPHEN 325 MG PO TABS: 650.0000 mg | ORAL_TABLET | ORAL | Status: DC | PRN

## 2019-07-29 MED ORDER — ANTICOAGULANT SODIUM CITRATE 4% (200MG/5ML) IV SOLN
5.0000 mL | Freq: Once | Status: DC
  Filled 2019-07-29: qty 5

## 2019-07-29 MED ORDER — CALCIUM CARBONATE ANTACID 500 MG PO CHEW
CHEWABLE_TABLET | ORAL | Status: AC
  Administered 2019-07-29: 400 mg via ORAL
  Filled 2019-07-29: qty 4

## 2019-07-29 MED ORDER — ACD FORMULA A 0.73-2.45-2.2 GM/100ML VI SOLN
Status: AC
  Administered 2019-07-29: 1000 mL
  Filled 2019-07-29: qty 500

## 2019-07-29 MED ORDER — DIPHENHYDRAMINE HCL 25 MG PO CAPS: 25.0000 mg | ORAL_CAPSULE | Freq: Four times a day (QID) | ORAL | Status: DC | PRN

## 2019-07-29 MED ORDER — ACD FORMULA A 0.73-2.45-2.2 GM/100ML VI SOLN: 1000.0000 mL | Status: DC

## 2019-07-29 MED ORDER — DIPHENHYDRAMINE HCL 25 MG PO CAPS
ORAL_CAPSULE | ORAL | Status: AC
  Administered 2019-07-29: 25 mg via ORAL
  Filled 2019-07-29: qty 1

## 2019-07-29 MED ORDER — CALCIUM GLUCONATE-NACL 2-0.675 GM/100ML-% IV SOLN
2.0000 g | Freq: Once | INTRAVENOUS | Status: AC
  Administered 2019-07-29: 2000 mg via INTRAVENOUS
  Filled 2019-07-29: qty 100

## 2019-07-29 NOTE — Progress Notes (Signed)
TPE tx completed, Pt tolerated tx well, denies pain, dizziness, n/v, paresthesia, weakness; pt c/o of back spasms not painful; pt accompanied to the main entrance by Richard, Transporter.

## 2019-07-30 ENCOUNTER — Other Ambulatory Visit: Payer: Self-pay

## 2019-07-30 ENCOUNTER — Inpatient Hospital Stay: Payer: Federal, State, Local not specified - PPO | Attending: Hematology | Admitting: Hematology & Oncology

## 2019-07-30 ENCOUNTER — Inpatient Hospital Stay: Payer: Federal, State, Local not specified - PPO

## 2019-07-30 VITALS — BP 138/70 | HR 99 | Temp 97.5°F | Resp 18 | Wt 154.0 lb

## 2019-07-30 DIAGNOSIS — K7581 Nonalcoholic steatohepatitis (NASH): Secondary | ICD-10-CM | POA: Diagnosis not present

## 2019-07-30 DIAGNOSIS — M311 Thrombotic microangiopathy: Secondary | ICD-10-CM

## 2019-07-30 DIAGNOSIS — M3119 Other thrombotic microangiopathy: Secondary | ICD-10-CM

## 2019-07-30 LAB — THERAPEUTIC PLASMA EXCHANGE (BLOOD BANK)
Plasma volume needed: 3000
Unit division: 0
Unit division: 0
Unit division: 0
Unit division: 0
Unit division: 0
Unit division: 0
Unit division: 0
Unit division: 0
Unit division: 0

## 2019-07-30 LAB — CMP (CANCER CENTER ONLY)
ALT: 22 U/L (ref 0–44)
AST: 43 U/L — ABNORMAL HIGH (ref 15–41)
Albumin: 3.8 g/dL (ref 3.5–5.0)
Alkaline Phosphatase: 72 U/L (ref 38–126)
Anion gap: 5 (ref 5–15)
BUN: 9 mg/dL (ref 6–20)
CO2: 30 mmol/L (ref 22–32)
Calcium: 9.6 mg/dL (ref 8.9–10.3)
Chloride: 106 mmol/L (ref 98–111)
Creatinine: 0.98 mg/dL (ref 0.44–1.00)
GFR, Est AFR Am: >60 mL/min (ref >60)
GFR, Estimated: >60 mL/min (ref >60)
Glucose, Bld: 220 mg/dL — ABNORMAL HIGH (ref 70–99)
Potassium: 3.7 mmol/L (ref 3.5–5.1)
Sodium: 141 mmol/L (ref 135–145)
Total Bilirubin: 0.6 mg/dL (ref 0.3–1.2)
Total Protein: 6.5 g/dL (ref 6.5–8.1)

## 2019-07-30 LAB — CBC WITH DIFFERENTIAL (CANCER CENTER ONLY)
Abs Immature Granulocytes: 0.03 10*3/uL (ref 0.00–0.07)
Basophils Absolute: 0 10*3/uL (ref 0.0–0.1)
Basophils Relative: 0 %
Eosinophils Absolute: 1 10*3/uL — ABNORMAL HIGH (ref 0.0–0.5)
Eosinophils Relative: 33 %
HCT: 29.7 % — ABNORMAL LOW (ref 36.0–46.0)
Hemoglobin: 9.6 g/dL — ABNORMAL LOW (ref 12.0–15.0)
Immature Granulocytes: 1 %
Lymphocytes Relative: 22 %
Lymphs Abs: 0.7 10*3/uL (ref 0.7–4.0)
MCH: 29.8 pg (ref 26.0–34.0)
MCHC: 32.3 g/dL (ref 30.0–36.0)
MCV: 92.2 fL (ref 80.0–100.0)
Monocytes Absolute: 0.2 10*3/uL (ref 0.1–1.0)
Monocytes Relative: 6 %
Neutro Abs: 1.2 10*3/uL — ABNORMAL LOW (ref 1.7–7.7)
Neutrophils Relative %: 38 %
Platelet Count: 99 10*3/uL — ABNORMAL LOW (ref 150–400)
RBC: 3.22 MIL/uL — ABNORMAL LOW (ref 3.87–5.11)
RDW: 13.8 % (ref 11.5–15.5)
WBC Count: 3 10*3/uL — ABNORMAL LOW (ref 4.0–10.5)
nRBC: 0 % (ref 0.0–0.2)

## 2019-07-30 LAB — RETICULOCYTES
Immature Retic Fract: 14.1 % (ref 2.3–15.9)
RBC.: 3.23 MIL/uL — ABNORMAL LOW (ref 3.87–5.11)
Retic Count, Absolute: 53.6 10*3/uL (ref 19.0–186.0)
Retic Ct Pct: 1.7 % (ref 0.4–3.1)

## 2019-07-30 LAB — SAVE SMEAR(SSMR), FOR PROVIDER SLIDE REVIEW

## 2019-07-30 NOTE — Progress Notes (Signed)
Hematology and Oncology Follow Up Visit  Rheba [name] [id number] 12/21/1960 59 y.o. 07/30/2019   Principle Diagnosis:   TTP - acquired -- relapsed  NASH -- leukopenia/thrombocytopenia  Current Therapy:    Plasma Exchange - M-Th -- d/c on 08/01/2019  Rituxan 375 mg/m2 IV weekly -- s/p cycle #4     Interim History:  Vicki [name] is back for her follow-up.  She still is doing quite well.  Her last  ADAMTS-13 activity level was 100%.  This should be a good indicator that she has responded.Vicki [name]  She will always have some thrombocytopenia and leukopenia and anemia because of the cirrhosis.  We will go ahead and complete the plasma exchange tomorrow.  She will then have the dialysis catheter removed.  She says that she is going out to [place] on Friday for the cirrhosis.  She has had some slight leg swelling.  This is probably from her cirrhosis and anemia.  Her blood sugars still are on the high side.  Long-term, I think this is going to be her problem.  She she has some tingling in the fingers and toes.  I told her that I thought this was probably neuropathy from the diabetes.  Overall, her performance status right now is ECOG 1.    Medications:  Current Outpatient Medications:  .  Alum Hydroxide-Mag Carbonate (GAVISCON EXTRA STRENGTH PO), Take 1 tablet by mouth 4 (four) times daily as needed., Disp: , Rfl:  .  calcium carbonate (TUMS - DOSED IN MG ELEMENTAL CALCIUM) 500 MG chewable tablet, Chew 2 tablets (400 mg of elemental calcium total) by mouth every 3 (three) hours. (Patient taking differently: Chew 2 tablets by mouth 2 (two) times a week. @ Plasma Exchange.), Disp: 30 tablet, Rfl: 0 .  cholecalciferol (VITAMIN D3) 25 MCG (1000 UT) tablet, Take 1,000 Units by mouth daily., Disp: , Rfl:  .  dextromethorphan-guaiFENesin (MUCINEX DM) 30-600 MG 12hr tablet, Take 2 tablets by mouth 2 (two) times daily as needed for cough., Disp: , Rfl:  .  diphenhydrAMINE (BENADRYL) 50 MG capsule,  Take 1 capsule (50 mg total) by mouth daily as needed (on call for plasma exchange)., Disp: 30 capsule, Rfl: 0 .  Dulaglutide (TRULICITY) 1.91 YN/8.2NF SOPN, Inject 0.75 mg into the skin once a week., Disp: 3 mL, Rfl: 3 .  Fexofenadine-Pseudoephedrine (ALLEGRA-D 24 HOUR PO), Take by mouth daily as needed. , Disp: , Rfl:  .  folic acid (FOLVITE) 1 MG tablet, Take 2 tablets (2 mg total) by mouth daily., Disp: 60 tablet, Rfl: 12 .  furosemide (LASIX) 40 MG tablet, Take 1 tablet by mouth once daily, Disp: 30 tablet, Rfl: 0 .  glucose blood test strip, One touch ultra, Disp: , Rfl:  .  Multiple Vitamins-Minerals (MULTI FOR HER 50+) TABS, Take 1 tablet by mouth daily., Disp: , Rfl:  .  mupirocin ointment (BACTROBAN) 2 %, Apply 1 application topically 2 (two) times daily., Disp: 22 g, Rfl: 0 .  potassium chloride (KLOR-CON) 10 MEQ tablet, Take 1 tablet by mouth once daily, Disp: 30 tablet, Rfl: 0 .  senna-docusate (SENOKOT-S) 8.6-50 MG tablet, Take 2 tablets by mouth 2 (two) times daily., Disp: 20 tablet, Rfl: 0 .  sitaGLIPtin (JANUVIA) 100 MG tablet, Take 1 tablet (100 mg total) by mouth daily., Disp: 90 tablet, Rfl: 0  Allergies:  Allergies  Allergen Reactions  . Metformin And Related Itching    Heart palpitation  . Sulfa Antibiotics Itching  . Sulfasalazine Itching  Past Medical History, Surgical history, Social history, and Family History were reviewed and updated.  Review of Systems: Review of Systems  Constitutional: Negative.   HENT:  Negative.   Eyes: Negative.   Respiratory: Negative.   Cardiovascular: Positive for leg swelling.  Gastrointestinal: Negative.   Endocrine: Negative.   Genitourinary: Negative.    Musculoskeletal: Positive for arthralgias and myalgias.  Skin: Negative.   Neurological: Negative.   Hematological: Negative.   Psychiatric/Behavioral: Negative.     Physical Exam:  weight is 154 lb (69.9 kg). Her temporal temperature is 97.5 F (36.4 C) (abnormal).  Her blood pressure is 138/70 and her pulse is 99. Her respiration is 18 and oxygen saturation is 100%.   Wt Readings from Last 3 Encounters:  07/30/19 154 lb (69.9 kg)  07/05/19 155 lb 6.4 oz (70.5 kg)  07/04/19 154 lb 8.7 oz (70.1 kg)    Physical Exam Vitals reviewed.  HENT:     Head: Normocephalic and atraumatic.  Eyes:     Pupils: Pupils are equal, round, and reactive to light.  Cardiovascular:     Rate and Rhythm: Normal rate and regular rhythm.     Heart sounds: Normal heart sounds.  Pulmonary:     Effort: Pulmonary effort is normal.     Breath sounds: Normal breath sounds.  Abdominal:     General: Bowel sounds are normal.     Palpations: Abdomen is soft.  Musculoskeletal:        General: No tenderness or deformity. Normal range of motion.     Cervical back: Normal range of motion.     Comments: Extremities shows some 1+ edema in her lower legs.  She has good range of motion of her joints.  She has good strength in upper and lower extremities.  Lymphadenopathy:     Cervical: No cervical adenopathy.  Skin:    General: Skin is warm and dry.     Findings: No erythema or rash.  Neurological:     Mental Status: She is alert and oriented to person, place, and time.  Psychiatric:        Behavior: Behavior normal.        Thought Content: Thought content normal.        Judgment: Judgment normal.      Lab Results  Component Value Date   WBC 3.0 (L) 07/30/2019   HGB 9.6 (L) 07/30/2019   HCT 29.7 (L) 07/30/2019   MCV 92.2 07/30/2019   PLT 99 (L) 07/30/2019     Chemistry      Component Value Date/Time   NA 141 07/30/2019 1324   K 3.7 07/30/2019 1324   CL 106 07/30/2019 1324   CO2 30 07/30/2019 1324   BUN 9 07/30/2019 1324   CREATININE 0.98 07/30/2019 1324   CREATININE 0.70 09/25/2015 1504      Component Value Date/Time   CALCIUM 9.6 07/30/2019 1324   ALKPHOS 72 07/30/2019 1324   AST 43 (H) 07/30/2019 1324   ALT 22 07/30/2019 1324   BILITOT 0.6 07/30/2019 1324        Impression and Plan: Vicki [name] is a 59 year old African-American female.  She has acquired TTP.  She had relapsed.  She is responding incredibly well.  Her platelet count today is 103,000.   Again, her platelet count will never be normal secondary to the NASH.  We will see what her LDH is.  I really hope that her last plasma exchange will be this Thursday.  We will  then have interventional radiology take out the catheter.  I will probably plan to get her back to see me in another 3 or 4 weeks.  I would like to hope that she will stay in remission.  If not, then we may have to get her back on the Rituxan along with additional plasma exchange and possibly try Caplivi.   Volanda Napoleon, MD 5/25/20214:49 PM

## 2019-07-31 LAB — LACTATE DEHYDROGENASE: LDH: 216 U/L — ABNORMAL HIGH (ref 98–192)

## 2019-08-01 ENCOUNTER — Non-Acute Institutional Stay (HOSPITAL_COMMUNITY)
Admission: AD | Admit: 2019-08-01 | Discharge: 2019-08-01 | Disposition: A | Discharge status: Home / Self Care | Payer: Federal, State, Local not specified - PPO | Source: Ambulatory Visit | Attending: Hematology & Oncology | Admitting: Hematology & Oncology

## 2019-08-01 ENCOUNTER — Telehealth (HOSPITAL_COMMUNITY): Payer: Self-pay

## 2019-08-01 DIAGNOSIS — M311 Thrombotic microangiopathy: Secondary | ICD-10-CM | POA: Diagnosis not present

## 2019-08-01 LAB — BASIC METABOLIC PANEL
Anion gap: 11 (ref 5–15)
BUN: 6 mg/dL (ref 6–20)
CO2: 23 mmol/L (ref 22–32)
Calcium: 8.9 mg/dL (ref 8.9–10.3)
Chloride: 107 mmol/L (ref 98–111)
Creatinine, Ser: 0.68 mg/dL (ref 0.44–1.00)
GFR calc Af Amer: >60 mL/min (ref >60)
GFR calc non Af Amer: >60 mL/min (ref >60)
Glucose, Bld: 187 mg/dL — ABNORMAL HIGH (ref 70–99)
Potassium: 3.5 mmol/L (ref 3.5–5.1)
Sodium: 141 mmol/L (ref 135–145)

## 2019-08-01 LAB — CBC WITH DIFFERENTIAL/PLATELET
Abs Immature Granulocytes: 0 10*3/uL (ref 0.00–0.07)
Basophils Absolute: 0 10*3/uL (ref 0.0–0.1)
Basophils Relative: 0 %
Eosinophils Absolute: 1.2 10*3/uL — ABNORMAL HIGH (ref 0.0–0.5)
Eosinophils Relative: 40 %
HCT: 29.1 % — ABNORMAL LOW (ref 36.0–46.0)
Hemoglobin: 9.3 g/dL — ABNORMAL LOW (ref 12.0–15.0)
Immature Granulocytes: 0 %
Lymphocytes Relative: 20 %
Lymphs Abs: 0.6 10*3/uL — ABNORMAL LOW (ref 0.7–4.0)
MCH: 29.6 pg (ref 26.0–34.0)
MCHC: 32 g/dL (ref 30.0–36.0)
MCV: 92.7 fL (ref 80.0–100.0)
Monocytes Absolute: 0.2 10*3/uL (ref 0.1–1.0)
Monocytes Relative: 7 %
Neutro Abs: 1 10*3/uL — ABNORMAL LOW (ref 1.7–7.7)
Neutrophils Relative %: 33 %
Platelets: 96 10*3/uL — ABNORMAL LOW (ref 150–400)
RBC: 3.14 MIL/uL — ABNORMAL LOW (ref 3.87–5.11)
RDW: 13.8 % (ref 11.5–15.5)
WBC: 3 10*3/uL — ABNORMAL LOW (ref 4.0–10.5)
nRBC: 0 % (ref 0.0–0.2)

## 2019-08-01 LAB — ADAMTS13 ACTIVITY: Adamts 13 Activity: >100 % (ref >66.8)

## 2019-08-01 LAB — ADAMTS13 ACTIVITY REFLEX

## 2019-08-01 MED ORDER — CALCIUM GLUCONATE-NACL 2-0.675 GM/100ML-% IV SOLN
2.0000 g | Freq: Once | INTRAVENOUS | Status: AC
  Administered 2019-08-01: 2000 mg via INTRAVENOUS
  Filled 2019-08-01: qty 100

## 2019-08-01 MED ORDER — DIPHENHYDRAMINE HCL 25 MG PO CAPS: 25.0000 mg | ORAL_CAPSULE | Freq: Four times a day (QID) | ORAL | Status: DC | PRN

## 2019-08-01 MED ORDER — DIPHENHYDRAMINE HCL 25 MG PO CAPS
ORAL_CAPSULE | ORAL | Status: AC
  Administered 2019-08-01: 25 mg via ORAL
  Filled 2019-08-01: qty 1

## 2019-08-01 MED ORDER — CALCIUM CARBONATE ANTACID 500 MG PO CHEW
2.0000 | CHEWABLE_TABLET | ORAL | Status: AC
  Administered 2019-08-01: 400 mg via ORAL

## 2019-08-01 MED ORDER — ACD FORMULA A 0.73-2.45-2.2 GM/100ML VI SOLN
Status: AC
  Administered 2019-08-01: 1000 mL
  Filled 2019-08-01: qty 500

## 2019-08-01 MED ORDER — CALCIUM CARBONATE ANTACID 500 MG PO CHEW
CHEWABLE_TABLET | ORAL | Status: AC
  Administered 2019-08-01: 400 mg via ORAL
  Filled 2019-08-01: qty 4

## 2019-08-01 MED ORDER — ACD FORMULA A 0.73-2.45-2.2 GM/100ML VI SOLN: 1000.0000 mL | Status: DC

## 2019-08-01 MED ORDER — SODIUM CHLORIDE 0.9 % IV SOLN
40.0000 mg | Freq: Once | INTRAVENOUS | Status: AC
  Administered 2019-08-01: 40 mg via INTRAVENOUS
  Filled 2019-08-01: qty 4

## 2019-08-01 MED ORDER — ANTICOAGULANT SODIUM CITRATE 4% (200MG/5ML) IV SOLN
5.0000 mL | Freq: Once | Status: AC
  Administered 2019-08-01: 5 mL
  Filled 2019-08-01: qty 5

## 2019-08-01 MED ORDER — ACETAMINOPHEN 325 MG PO TABS: 650.0000 mg | ORAL_TABLET | ORAL | Status: DC | PRN

## 2019-08-01 MED ORDER — METHYLPREDNISOLONE SODIUM SUCC 125 MG IJ SOLR
INTRAMUSCULAR | Status: AC
  Administered 2019-08-01: 125 mg
  Filled 2019-08-01: qty 2

## 2019-08-01 NOTE — Telephone Encounter (Signed)
Called to schedule cath removal, no answer, left vm. AW

## 2019-08-01 NOTE — Progress Notes (Signed)
Pt completed the TPE and started complaining of itching at the end of tx; pt given benadryl 3m PO, hives noted on her back and chest; Dr. PFreddie Apleyof contacted and his NP ordered solumedrol 1241mIV which was administered with no abatement of the symptom; called back the office and the NP ordered Pepcid 40 mg IV; administered the pepcid; pt states the itching is decreasing and the hive noted subsiding and redness also disappearing. Allowed pt to wait on the unit to monitor her progress; patients states she feels better; not itching anymore; hives are not noticeable. Pt accompanied to the main entrance by JaWillia CrazeCHHT.

## 2019-08-02 DIAGNOSIS — R109 Unspecified abdominal pain: Secondary | ICD-10-CM | POA: Diagnosis not present

## 2019-08-02 DIAGNOSIS — K7460 Unspecified cirrhosis of liver: Secondary | ICD-10-CM | POA: Diagnosis not present

## 2019-08-02 LAB — THERAPEUTIC PLASMA EXCHANGE (BLOOD BANK)
Plasma Exchange: 3000
Plasma volume needed: 3000
Unit division: 0
Unit division: 0
Unit division: 0
Unit division: 0
Unit division: 0
Unit division: 0
Unit division: 0

## 2019-08-06 ENCOUNTER — Other Ambulatory Visit: Payer: Self-pay | Admitting: Hematology & Oncology

## 2019-08-06 ENCOUNTER — Other Ambulatory Visit: Payer: Self-pay

## 2019-08-06 ENCOUNTER — Ambulatory Visit (HOSPITAL_COMMUNITY)
Admission: RE | Admit: 2019-08-06 | Discharge: 2019-08-06 | Disposition: A | Discharge status: Home / Self Care | Payer: Federal, State, Local not specified - PPO | Source: Ambulatory Visit | Attending: Hematology & Oncology | Admitting: Hematology & Oncology

## 2019-08-06 ENCOUNTER — Other Ambulatory Visit: Payer: Self-pay | Admitting: Family Medicine

## 2019-08-06 DIAGNOSIS — M311 Thrombotic microangiopathy: Secondary | ICD-10-CM | POA: Insufficient documentation

## 2019-08-06 DIAGNOSIS — Z4901 Encounter for fitting and adjustment of extracorporeal dialysis catheter: Secondary | ICD-10-CM | POA: Insufficient documentation

## 2019-08-06 DIAGNOSIS — M3119 Other thrombotic microangiopathy: Secondary | ICD-10-CM

## 2019-08-06 DIAGNOSIS — E119 Type 2 diabetes mellitus without complications: Secondary | ICD-10-CM

## 2019-08-06 HISTORY — PX: IR REMOVAL TUN CV CATH W/O FL: IMG2289

## 2019-08-06 MED ORDER — LIDOCAINE HCL 1 % IJ SOLN
INTRAMUSCULAR | Status: AC
  Filled 2019-08-06: qty 20

## 2019-08-06 MED ORDER — LIDOCAINE HCL (PF) 1 % IJ SOLN
INTRAMUSCULAR | Status: DC | PRN
  Administered 2019-08-06: 10 mL

## 2019-08-06 MED ORDER — CHLORHEXIDINE GLUCONATE 4 % EX LIQD
CUTANEOUS | Status: AC
  Filled 2019-08-06: qty 15

## 2019-08-07 ENCOUNTER — Ambulatory Visit: Payer: Federal, State, Local not specified - PPO | Admitting: Family Medicine

## 2019-08-12 ENCOUNTER — Telehealth: Payer: Self-pay | Admitting: Family Medicine

## 2019-08-12 ENCOUNTER — Ambulatory Visit (INDEPENDENT_AMBULATORY_CARE_PROVIDER_SITE_OTHER): Payer: Federal, State, Local not specified - PPO | Admitting: Podiatry

## 2019-08-12 ENCOUNTER — Other Ambulatory Visit: Payer: Self-pay

## 2019-08-12 DIAGNOSIS — M79674 Pain in right toe(s): Secondary | ICD-10-CM

## 2019-08-12 DIAGNOSIS — M79675 Pain in left toe(s): Secondary | ICD-10-CM | POA: Diagnosis not present

## 2019-08-12 DIAGNOSIS — E119 Type 2 diabetes mellitus without complications: Secondary | ICD-10-CM

## 2019-08-12 DIAGNOSIS — B351 Tinea unguium: Secondary | ICD-10-CM

## 2019-08-12 NOTE — Telephone Encounter (Signed)
Pt returned phone call. I notified her of these recommendations. She verbalized understanding.

## 2019-08-12 NOTE — Telephone Encounter (Signed)
Patient called today to reschedule her follow up appointment to Thursday 6/10 @ 11:00  She would like to know if she will need to fast for this appointment so she can have blood work done

## 2019-08-12 NOTE — Telephone Encounter (Signed)
She does not need to fast. We will likely check some labs, but fasting is not required.

## 2019-08-12 NOTE — Telephone Encounter (Signed)
LVM for patient to return phone call.  

## 2019-08-12 NOTE — Patient Instructions (Signed)
Diabetes Mellitus and Foot Care Foot care is an important part of your health, especially when you have diabetes. Diabetes may cause you to have problems because of poor blood flow (circulation) to your feet and legs, which can cause your skin to:  Become thinner and drier.  Break more easily.  Heal more slowly.  Peel and crack. You may also have nerve damage (neuropathy) in your legs and feet, causing decreased feeling in them. This means that you may not notice minor injuries to your feet that could lead to more serious problems. Noticing and addressing any potential problems early is the best way to prevent future foot problems. How to care for your feet Foot hygiene  Wash your feet daily with warm water and mild soap. Do not use hot water. Then, pat your feet and the areas between your toes until they are completely dry. Do not soak your feet as this can dry your skin.  Trim your toenails straight across. Do not dig under them or around the cuticle. File the edges of your nails with an emery board or nail file.  Apply a moisturizing lotion or petroleum jelly to the skin on your feet and to dry, brittle toenails. Use lotion that does not contain alcohol and is unscented. Do not apply lotion between your toes. Shoes and socks  Wear clean socks or stockings every day. Make sure they are not too tight. Do not wear knee-high stockings since they may decrease blood flow to your legs.  Wear shoes that fit properly and have enough cushioning. Always look in your shoes before you put them on to be sure there are no objects inside.  To break in new shoes, wear them for just a few hours a day. This prevents injuries on your feet. Wounds, scrapes, corns, and calluses  Check your feet daily for blisters, cuts, bruises, sores, and redness. If you cannot see the bottom of your feet, use a mirror or ask someone for help.  Do not cut corns or calluses or try to remove them with medicine.  If you  find a minor scrape, cut, or break in the skin on your feet, keep it and the skin around it clean and dry. You may clean these areas with mild soap and water. Do not clean the area with peroxide, alcohol, or iodine.  If you have a wound, scrape, corn, or callus on your foot, look at it several times a day to make sure it is healing and not infected. Check for: ? Redness, swelling, or pain. ? Fluid or blood. ? Warmth. ? Pus or a bad smell. General instructions  Do not cross your legs. This may decrease blood flow to your feet.  Do not use heating pads or hot water bottles on your feet. They may burn your skin. If you have lost feeling in your feet or legs, you may not know this is happening until it is too late.  Protect your feet from hot and cold by wearing shoes, such as at the beach or on hot pavement.  Schedule a complete foot exam at least once a year (annually) or more often if you have foot problems. If you have foot problems, report any cuts, sores, or bruises to your health care provider immediately. Contact a health care provider if:  You have a medical condition that increases your risk of infection and you have any cuts, sores, or bruises on your feet.  You have an injury that is not   healing.  You have redness on your legs or feet.  You feel burning or tingling in your legs or feet.  You have pain or cramps in your legs and feet.  Your legs or feet are numb.  Your feet always feel cold.  You have pain around a toenail. Get help right away if:  You have a wound, scrape, corn, or callus on your foot and: ? You have pain, swelling, or redness that gets worse. ? You have fluid or blood coming from the wound, scrape, corn, or callus. ? Your wound, scrape, corn, or callus feels warm to the touch. ? You have pus or a bad smell coming from the wound, scrape, corn, or callus. ? You have a fever. ? You have a red line going up your leg. Summary  Check your feet every day  for cuts, sores, red spots, swelling, and blisters.  Moisturize feet and legs daily.  Wear shoes that fit properly and have enough cushioning.  If you have foot problems, report any cuts, sores, or bruises to your health care provider immediately.  Schedule a complete foot exam at least once a year (annually) or more often if you have foot problems. This information is not intended to replace advice given to you by your health care provider. Make sure you discuss any questions you have with your health care provider. Document Revised: 11/14/2018 Document Reviewed: 03/25/2016 Elsevier Patient Education  2020 Elsevier Inc.  

## 2019-08-15 ENCOUNTER — Other Ambulatory Visit: Payer: Self-pay

## 2019-08-15 ENCOUNTER — Telehealth: Payer: Self-pay

## 2019-08-15 ENCOUNTER — Ambulatory Visit (INDEPENDENT_AMBULATORY_CARE_PROVIDER_SITE_OTHER): Payer: Federal, State, Local not specified - PPO | Admitting: Family Medicine

## 2019-08-15 ENCOUNTER — Encounter: Payer: Self-pay | Admitting: Family Medicine

## 2019-08-15 VITALS — BP 136/80 | HR 85 | Ht 61.0 in | Wt 152.0 lb

## 2019-08-15 DIAGNOSIS — Z1159 Encounter for screening for other viral diseases: Secondary | ICD-10-CM | POA: Diagnosis not present

## 2019-08-15 DIAGNOSIS — D649 Anemia, unspecified: Secondary | ICD-10-CM | POA: Insufficient documentation

## 2019-08-15 DIAGNOSIS — M311 Thrombotic microangiopathy: Secondary | ICD-10-CM

## 2019-08-15 DIAGNOSIS — E119 Type 2 diabetes mellitus without complications: Secondary | ICD-10-CM | POA: Diagnosis not present

## 2019-08-15 DIAGNOSIS — M3119 Other thrombotic microangiopathy: Secondary | ICD-10-CM

## 2019-08-15 LAB — CBC WITH DIFFERENTIAL/PLATELET
Basophils Absolute: 0 10*3/uL (ref 0.0–0.1)
Basophils Relative: 0.8 % (ref 0.0–3.0)
Eosinophils Absolute: 1.5 10*3/uL — ABNORMAL HIGH (ref 0.0–0.7)
Eosinophils Relative: 37.7 % — ABNORMAL HIGH (ref 0.0–5.0)
HCT: 32.8 % — ABNORMAL LOW (ref 36.0–46.0)
Hemoglobin: 11 g/dL — ABNORMAL LOW (ref 12.0–15.0)
Lymphocytes Relative: 17.8 % (ref 12.0–46.0)
Lymphs Abs: 0.7 10*3/uL (ref 0.7–4.0)
MCHC: 33.7 g/dL (ref 30.0–36.0)
MCV: 88.7 fl (ref 78.0–100.0)
Monocytes Absolute: 0.2 10*3/uL (ref 0.1–1.0)
Monocytes Relative: 6.3 % (ref 3.0–12.0)
Neutro Abs: 1.5 10*3/uL (ref 1.4–7.7)
Neutrophils Relative %: 37.4 % — ABNORMAL LOW (ref 43.0–77.0)
Platelets: 146 10*3/uL — ABNORMAL LOW (ref 150.0–400.0)
RBC: 3.69 Mil/uL — ABNORMAL LOW (ref 3.87–5.11)
RDW: 14.1 % (ref 11.5–15.5)
WBC: 3.9 10*3/uL — ABNORMAL LOW (ref 4.0–10.5)

## 2019-08-15 MED ORDER — SITAGLIPTIN PHOSPHATE 100 MG PO TABS: 100.0000 mg | ORAL_TABLET | Freq: Every day | ORAL | 3 refills | Status: DC

## 2019-08-15 NOTE — Patient Instructions (Addendum)
Try to eat 72-80 grams of protein daily - for cirrhosis    Great job with diabetes management   Talk with your hematologist about the anemia

## 2019-08-15 NOTE — Assessment & Plan Note (Addendum)
Home CBG note significant improvement from prior. Continue current medication. Overall her foot exam is reassuring and discussed monitoring. Will continue to watch area of numbness for progression. Ordering Fructosamine for diabetes monitoring due to inaccuracy of HgbA1c.

## 2019-08-15 NOTE — Telephone Encounter (Signed)
Patient aware have of results.

## 2019-08-15 NOTE — Assessment & Plan Note (Signed)
Following with hematology. GI screening up to date. Does have cirrhosis and on high protein diet. Ongoing treatment for TTP but encouraged discussing with hematology if anything else should be done. Repeat labs today. Also ordering Fructosamine for diabetes monitoring due to inaccuracy of HgbA1c.

## 2019-08-15 NOTE — Telephone Encounter (Signed)
-----   Message from Lesleigh Noe, MD sent at 08/15/2019  3:09 PM EDT ----- Please call patient and let her know her blood counts are significantly improved.   Hemoglobin 9.3 to 11 Platelets 96 to 146  Remaining labs are not back.

## 2019-08-15 NOTE — Progress Notes (Signed)
Subjective:     Vicki [name] is a 59 y.o. female presenting for Diabetes     HPI   #Diabetes Currently taking [place] and trulicity  Using medications without difficulties: No Hypoglycemic episodes:No  Hyperglycemic episodes:No  Feet problems:some numbness on the right foot Blood Sugars averaging: 120-130 Last HgbA1c:  Lab Results  Component Value Date   HGBA1C 6.8 (H) 05/16/2019    Diabetes Health Maintenance Due:    Diabetes Health Maintenance Due  Topic Date Due  . HEMOGLOBIN A1C  11/16/2019  . URINE MICROALBUMIN  02/26/2020  . OPHTHALMOLOGY EXAM  06/27/2020  . FOOT EXAM  08/14/2020    #TTP - has been getting treatment - just finished - f/u with hematology the end of this month - was getting plasma exchange and Rituxan - had a 4 week cycle due the reactions  #Cirrhosis - no cancer    Review of Systems   Social History   Tobacco Use  Smoking Status Never Smoker  Smokeless Tobacco Never Used        Objective:    BP Readings from Last 3 Encounters:  08/15/19 136/80  08/01/19 (!) 147/82  07/30/19 138/70   Wt Readings from Last 3 Encounters:  08/15/19 152 lb (68.9 kg)  07/30/19 154 lb (69.9 kg)  07/05/19 155 lb 6.4 oz (70.5 kg)    BP 136/80   Pulse 85   Ht 5' 1"  (1.549 m)   Wt 152 lb (68.9 kg)   LMP 04/30/2012   SpO2 98%   BMI 28.72 kg/m    Physical Exam Constitutional:      General: She is not in acute distress.    Appearance: She is well-developed. She is not diaphoretic.  HENT:     Right Ear: External ear normal.     Left Ear: External ear normal.  Eyes:     Conjunctiva/sclera: Conjunctivae normal.  Cardiovascular:     Rate and Rhythm: Normal rate and regular rhythm.  Pulmonary:     Effort: Pulmonary effort is normal. No respiratory distress.     Breath sounds: Normal breath sounds. No wheezing.  Musculoskeletal:     Cervical back: Neck supple.  Skin:    General: Skin is warm and dry.     Capillary Refill:  Capillary refill takes less than 2 seconds.  Neurological:     Mental Status: She is alert. Mental status is at baseline.  Psychiatric:        Mood and Affect: Mood normal.        Behavior: Behavior normal.           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 Will check labs per request. She has heme f/u later this month - appreciate their close care of patient.       Relevant Orders   CBC with Differential     Endocrine   Controlled type 2 diabetes mellitus without complication (HCC) (Chronic)    Home CBG note significant improvement from prior. Continue current medication. Overall her foot exam is reassuring and discussed monitoring. Will continue to watch area of numbness for progression. Ordering Fructosamine for diabetes monitoring due to inaccuracy of HgbA1c.       Relevant Medications   sitaGLIPtin (JANUVIA) 100 MG tablet   Other Relevant Orders   Fructosamine  Other   Normocytic anemia    Following with hematology. GI screening up to date. Does have cirrhosis and on high protein diet. Ongoing treatment for TTP but encouraged discussing with hematology if anything else should be done. Repeat labs today. Also ordering Fructosamine for diabetes monitoring due to inaccuracy of HgbA1c.        Other Visit Diagnoses    Encounter for hepatitis C screening test for low risk patient       Relevant Orders   Hepatitis C antibody       Return in about 6 months (around 02/14/2020).  Lesleigh Noe, MD

## 2019-08-15 NOTE — Assessment & Plan Note (Signed)
Recently completed exchange and rituxan treatment. Will check labs per request. She has heme f/u later this month - appreciate their close care of patient.

## 2019-08-16 ENCOUNTER — Encounter: Payer: Self-pay | Admitting: Podiatry

## 2019-08-16 NOTE — Progress Notes (Signed)
Subjective: Vicki [name] is a pleasant 59 y.o. female patient seen today preventative diabetic foot care and painful mycotic nails b/l that are difficult to trim. Pain interferes with ambulation. Aggravating factors include wearing enclosed shoe gear. Pain is relieved with periodic professional debridement.   She voices no new pedal problems on today's visit. Her last A1c was 6.8%.  Patient Active Problem List   Diagnosis Date Noted  . Normocytic anemia 08/15/2019  . Cellulitis 05/16/2019  . Cardiac murmur 05/08/2019  . Blood glucose elevated 04/23/2019  . Boil of groin 04/23/2019  . 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 Insomnia 02/26/2019  . Acquired pancytopenia ([place]) 11/01/2018  . Liver cirrhosis secondary to NASH ([place]) 04/25/2018  . 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 Spinal stenosis of lumbar region 01/17/2018  . Palpitations 04/15/2015  . Controlled type 2 diabetes mellitus without complication ([place]) 84/69/6295  . Elevated liver enzymes 03/31/2014  . Tinea pedis 07/31/2013  . Foot and toe(s), blister, infected 07/31/2013  . 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 Pain in joint, ankle and foot 08/31/2012    Current Outpatient Medications on File Prior to Visit  Medication Sig Dispense Refill  . Alum Hydroxide-Mag Carbonate (GAVISCON EXTRA STRENGTH PO) Take 1 tablet by mouth 4 (four) times daily as needed.    . cholecalciferol (VITAMIN D3) 25 MCG (1000 UT) tablet Take 1,000 Units by mouth daily.    [name] dextromethorphan-guaiFENesin (MUCINEX DM) 30-600 MG 12hr tablet Take 2 tablets by mouth 2 (two) times daily as needed for cough.    . Dulaglutide (TRULICITY) 2.84 XL/2.4MW SOPN Inject 0.75 mg into the skin once a week. 3 mL 3  . folic acid (FOLVITE) 1 MG tablet Take 2 tablets (2 mg total) by mouth daily. 60 tablet 12  . furosemide (LASIX) 40 MG tablet Take 1 tablet by mouth once daily 30 tablet 0  . glucose blood test strip  One touch ultra    . Multiple Vitamins-Minerals (MULTI FOR HER 50+) TABS Take 1 tablet by mouth daily.    . mupirocin ointment (BACTROBAN) 2 % Apply 1 application topically 2 (two) times daily. 22 g 0  . potassium chloride (KLOR-CON) 10 MEQ tablet Take 1 tablet by mouth once daily 30 tablet 0  . senna-docusate (SENOKOT-S) 8.6-50 MG tablet Take 2 tablets by mouth 2 (two) times daily. 20 tablet 0   No current facility-administered medications on file prior to visit.    Allergies  Allergen Reactions  . Metformin And Related Itching    Heart palpitation  . Sulfa Antibiotics Itching  . Sulfasalazine Itching    Objective: Physical Exam  General: Vicki [name] is a pleasant 59 y.o. African American female, in NAD. AAO x 3.   Vascular:  Neurovascular status unchanged b/l lower extremities. Capillary refill time to digits immediate b/l. Palpable DP pulses b/l. Palpable PT pulses b/l. Pedal hair present b/l. Skin temperature gradient within normal limits b/l. No edema noted b/l.  Dermatological:  Pedal skin with normal turgor, texture and tone bilaterally. No open wounds bilaterally. No interdigital macerations bilaterally. Toenails 1-5 b/l elongated, discolored, dystrophic, thickened, crumbly with subungual debris and tenderness to dorsal palpation.  Musculoskeletal:  Normal muscle strength 5/5 to all lower extremity muscle groups bilaterally. No pain crepitus or joint limitation noted with ROM b/l. No gross bony deformities bilaterally.  Neurological:  Protective sensation intact 5/5 intact bilaterally with 10g monofilament b/l. Vibratory sensation intact b/l. Proprioception intact bilaterally.  Assessment and Plan:  1. Pain due to onychomycosis of toenails of both feet   2. Controlled type 2 diabetes mellitus without complication, without long-term current use of insulin ([place])   -Examined patient. -Continue diabetic foot care principles. -Toenails 1-5 b/l were debrided in  length and girth with sterile nail nippers and dremel without iatrogenic bleeding. Discussed treatment options for onychomycosis. Advised tea tree oil to nails once daily.  -Patient to report any pedal injuries to medical professional immediately. -Patient to continue soft, supportive shoe gear daily. -Patient/POA to call should there be question/concern in the interim.  Return in about 4 months (around 12/12/2019) for diabetic nail trim.  Marzetta Board, DPM

## 2019-08-17 LAB — FRUCTOSAMINE: Fructosamine: 313 umol/L — ABNORMAL HIGH (ref 205–285)

## 2019-08-17 LAB — HEPATITIS C ANTIBODY
Hepatitis C Ab: NONREACTIVE
SIGNAL TO CUT-OFF: 0.05 (ref <1.00)

## 2019-08-19 ENCOUNTER — Telehealth: Payer: Self-pay | Admitting: Family Medicine

## 2019-08-19 DIAGNOSIS — E119 Type 2 diabetes mellitus without complications: Secondary | ICD-10-CM

## 2019-08-19 MED ORDER — TRULICITY 1.5 MG/0.5ML ~~LOC~~ SOAJ: 1.5000 mg | SUBCUTANEOUS | 3 refills | Status: DC

## 2019-08-19 NOTE — Telephone Encounter (Signed)
I left vm for patient to return phone call.

## 2019-08-19 NOTE — Telephone Encounter (Signed)
Please return to call.   Unfortunately there is not another word to explain her diagnosis in the chart.   Please provide reassurance that should a coworker access her chart without indication in providing patient care this would be a violation of HIPAA and the coworker should be held accountable for their actions.  If it were removed from her chart it could put her at risk for poor medical care due to other care providers not know all her possible health risks.    Please also update her on her remaining results  1) Fructosamine shows that her sugar is still running high. In the office she said her average glucose was ~120-130.   -- We could increase her Trulicity to 1.5 mg weekly.  -- I will send in a new prescription -- Call if blood sugar is <100 on new dose  2) No hepatitis C

## 2019-08-19 NOTE — Telephone Encounter (Signed)
Patient called today  She stated that she would not like the word cirrhosis used when witting on her paperwork. She said she has friends that work within [employer] and they can see that.  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 Patient said that they look at her paperwork . Patient said if there was different wording you could use   FYI

## 2019-08-20 DIAGNOSIS — D72819 Decreased white blood cell count, unspecified: Secondary | ICD-10-CM | POA: Diagnosis not present

## 2019-08-20 DIAGNOSIS — D696 Thrombocytopenia, unspecified: Secondary | ICD-10-CM | POA: Diagnosis not present

## 2019-08-20 DIAGNOSIS — D61818 Other pancytopenia: Secondary | ICD-10-CM | POA: Diagnosis not present

## 2019-08-20 DIAGNOSIS — K7460 Unspecified cirrhosis of liver: Secondary | ICD-10-CM | POA: Diagnosis not present

## 2019-08-20 DIAGNOSIS — Z6827 Body mass index (BMI) 27.0-27.9, adult: Secondary | ICD-10-CM | POA: Diagnosis not present

## 2019-08-20 DIAGNOSIS — K7581 Nonalcoholic steatohepatitis (NASH): Secondary | ICD-10-CM | POA: Diagnosis not present

## 2019-08-20 DIAGNOSIS — Z79899 Other long term (current) drug therapy: Secondary | ICD-10-CM | POA: Diagnosis not present

## 2019-08-20 DIAGNOSIS — E119 Type 2 diabetes mellitus without complications: Secondary | ICD-10-CM | POA: Diagnosis not present

## 2019-08-20 DIAGNOSIS — M311 Thrombotic microangiopathy: Secondary | ICD-10-CM | POA: Diagnosis not present

## 2019-08-23 ENCOUNTER — Other Ambulatory Visit: Payer: Self-pay

## 2019-08-23 ENCOUNTER — Inpatient Hospital Stay: Payer: Federal, State, Local not specified - PPO

## 2019-08-23 ENCOUNTER — Inpatient Hospital Stay: Payer: Federal, State, Local not specified - PPO | Attending: Hematology | Admitting: Hematology & Oncology

## 2019-08-23 ENCOUNTER — Encounter: Payer: Self-pay | Admitting: Hematology & Oncology

## 2019-08-23 VITALS — BP 124/65 | HR 86 | Temp 97.3°F | Resp 17 | Wt 154.0 lb

## 2019-08-23 DIAGNOSIS — M3119 Other thrombotic microangiopathy: Secondary | ICD-10-CM

## 2019-08-23 DIAGNOSIS — K7460 Unspecified cirrhosis of liver: Secondary | ICD-10-CM

## 2019-08-23 DIAGNOSIS — K7581 Nonalcoholic steatohepatitis (NASH): Secondary | ICD-10-CM | POA: Insufficient documentation

## 2019-08-23 DIAGNOSIS — D61818 Other pancytopenia: Secondary | ICD-10-CM | POA: Diagnosis not present

## 2019-08-23 DIAGNOSIS — Z7984 Long term (current) use of oral hypoglycemic drugs: Secondary | ICD-10-CM | POA: Diagnosis not present

## 2019-08-23 DIAGNOSIS — D72819 Decreased white blood cell count, unspecified: Secondary | ICD-10-CM | POA: Insufficient documentation

## 2019-08-23 DIAGNOSIS — M311 Thrombotic microangiopathy: Secondary | ICD-10-CM | POA: Insufficient documentation

## 2019-08-23 DIAGNOSIS — E119 Type 2 diabetes mellitus without complications: Secondary | ICD-10-CM | POA: Insufficient documentation

## 2019-08-23 DIAGNOSIS — Z79899 Other long term (current) drug therapy: Secondary | ICD-10-CM | POA: Diagnosis not present

## 2019-08-23 LAB — CBC WITH DIFFERENTIAL (CANCER CENTER ONLY)
Abs Immature Granulocytes: 0 10*3/uL (ref 0.00–0.07)
Basophils Absolute: 0 10*3/uL (ref 0.0–0.1)
Basophils Relative: 1 %
Eosinophils Absolute: 1.4 10*3/uL — ABNORMAL HIGH (ref 0.0–0.5)
Eosinophils Relative: 42 %
HCT: 32.2 % — ABNORMAL LOW (ref 36.0–46.0)
Hemoglobin: 10.3 g/dL — ABNORMAL LOW (ref 12.0–15.0)
Immature Granulocytes: 0 %
Lymphocytes Relative: 20 %
Lymphs Abs: 0.7 10*3/uL (ref 0.7–4.0)
MCH: 29.3 pg (ref 26.0–34.0)
MCHC: 32 g/dL (ref 30.0–36.0)
MCV: 91.5 fL (ref 80.0–100.0)
Monocytes Absolute: 0.2 10*3/uL (ref 0.1–1.0)
Monocytes Relative: 6 %
Neutro Abs: 1 10*3/uL — ABNORMAL LOW (ref 1.7–7.7)
Neutrophils Relative %: 31 %
Platelet Count: 122 10*3/uL — ABNORMAL LOW (ref 150–400)
RBC: 3.52 MIL/uL — ABNORMAL LOW (ref 3.87–5.11)
RDW: 13.8 % (ref 11.5–15.5)
WBC Count: 3.4 10*3/uL — ABNORMAL LOW (ref 4.0–10.5)
nRBC: 0 % (ref 0.0–0.2)

## 2019-08-23 LAB — CMP (CANCER CENTER ONLY)
ALT: 35 U/L (ref 0–44)
AST: 57 U/L — ABNORMAL HIGH (ref 15–41)
Albumin: 3.8 g/dL (ref 3.5–5.0)
Alkaline Phosphatase: 79 U/L (ref 38–126)
Anion gap: 8 (ref 5–15)
BUN: 8 mg/dL (ref 6–20)
CO2: 28 mmol/L (ref 22–32)
Calcium: 9.3 mg/dL (ref 8.9–10.3)
Chloride: 102 mmol/L (ref 98–111)
Creatinine: 0.77 mg/dL (ref 0.44–1.00)
GFR, Est AFR Am: >60 mL/min (ref >60)
GFR, Estimated: >60 mL/min (ref >60)
Glucose, Bld: 163 mg/dL — ABNORMAL HIGH (ref 70–99)
Potassium: 3.9 mmol/L (ref 3.5–5.1)
Sodium: 138 mmol/L (ref 135–145)
Total Bilirubin: 0.6 mg/dL (ref 0.3–1.2)
Total Protein: 7.8 g/dL (ref 6.5–8.1)

## 2019-08-23 LAB — RETICULOCYTES
Immature Retic Fract: 15.3 % (ref 2.3–15.9)
RBC.: 3.53 MIL/uL — ABNORMAL LOW (ref 3.87–5.11)
Retic Count, Absolute: 36 10*3/uL (ref 19.0–186.0)
Retic Ct Pct: 1 % (ref 0.4–3.1)

## 2019-08-23 LAB — SAVE SMEAR(SSMR), FOR PROVIDER SLIDE REVIEW

## 2019-08-23 NOTE — Progress Notes (Signed)
Hematology and Oncology Follow Up Visit  Vicki [name] [id number] 1960-06-01 59 y.o. 08/23/2019   Principle Diagnosis:   TTP - acquired -- relapsed  NASH -- leukopenia/thrombocytopenia  Current Therapy:    Plasma Exchange - M-Th -- d/c on 08/01/2019  Rituxan 375 mg/m2 IV weekly -- s/p cycle #4     Interim History:  Vicki [name] is back for her follow-up.  She still is doing quite well.  She is now off plasma exchange.  She has been off for about 3 weeks.  Her last  ADAMTS-13 activity level was > 100%.  Her last LDH was 216.  She actually went down to [facility] for a second opinion.  She saw Vicki [name].  I will await her consult note.  It sounds like Vicki [name] agreed with our approach.  I think that the fact that her ADAMTS-13 is doing so well is a very good indicator for [place].  She also went down to [facility].  She had a CT of the abdomen pelvis.  She has a upper limit of spleen size.  Her liver did not have any lesions.  Hopefully, she still would be considered a transplant candidate.  She has had no obvious bleeding.  She says that when she blew her nose 1 or 2 times there was some blood.  This may be a coagulopathy from her liver.  She has had no rashes.  There is been no fever.  She has had no cough or shortness of breath.  Her appetite is good.  Overall, I would say her performance status is ECOG 1.     Medications:  Current Outpatient Medications:  .  Alum Hydroxide-Mag Carbonate (GAVISCON EXTRA STRENGTH PO), Take 1 tablet by mouth 4 (four) times daily as needed., Disp: , Rfl:  .  cholecalciferol (VITAMIN D3) 25 MCG (1000 UT) tablet, Take 1,000 Units by mouth daily., Disp: , Rfl:  .  dextromethorphan-guaiFENesin (MUCINEX DM) 30-600 MG 12hr tablet, Take 2 tablets by mouth 2 (two) times daily as needed for cough., Disp: , Rfl:  .  Dulaglutide (TRULICITY) 1.5 ZW/2.5EN SOPN, Inject 0.5 mLs (1.5 mg total) into the skin once a week., Disp: 4 pen, Rfl: 3 .  folic acid  (FOLVITE) 1 MG tablet, Take 2 tablets (2 mg total) by mouth daily., Disp: 60 tablet, Rfl: 12 .  furosemide (LASIX) 40 MG tablet, Take 1 tablet by mouth once daily, Disp: 30 tablet, Rfl: 0 .  glucose blood test strip, One touch ultra, Disp: , Rfl:  .  Multiple Vitamins-Minerals (MULTI FOR HER 50+) TABS, Take 1 tablet by mouth daily., Disp: , Rfl:  .  potassium chloride (KLOR-CON) 10 MEQ tablet, Take 1 tablet by mouth once daily, Disp: 30 tablet, Rfl: 0 .  senna-docusate (SENOKOT-S) 8.6-50 MG tablet, Take 2 tablets by mouth 2 (two) times daily., Disp: 20 tablet, Rfl: 0 .  sitaGLIPtin (JANUVIA) 100 MG tablet, Take 1 tablet (100 mg total) by mouth daily., Disp: 90 tablet, Rfl: 3 .  traMADol (ULTRAM) 50 MG tablet, Take by mouth., Disp: , Rfl:   Allergies:  Allergies  Allergen Reactions  . Metformin And Related Itching    Heart palpitation  . Sulfa Antibiotics Itching  . Sulfasalazine Itching    Past Medical History, Surgical history, Social history, and Family History were reviewed and updated.  Review of Systems: Review of Systems  Constitutional: Negative.   HENT:  Negative.   Eyes: Negative.   Respiratory: Negative.  Cardiovascular: Positive for leg swelling.  Gastrointestinal: Negative.   Endocrine: Negative.   Genitourinary: Negative.    Musculoskeletal: Positive for arthralgias and myalgias.  Skin: Negative.   Neurological: Negative.   Hematological: Negative.   Psychiatric/Behavioral: Negative.     Physical Exam:  weight is 154 lb (69.9 kg). Her temporal temperature is 97.3 F (36.3 C) (abnormal). Her blood pressure is 124/65 and her pulse is 86. Her respiration is 17 and oxygen saturation is 100%.   Wt Readings from Last 3 Encounters:  08/23/19 154 lb (69.9 kg)  08/15/19 152 lb (68.9 kg)  07/30/19 154 lb (69.9 kg)    Physical Exam Vitals reviewed.  HENT:     Head: Normocephalic and atraumatic.  Eyes:     Pupils: Pupils are equal, round, and reactive to light.   Cardiovascular:     Rate and Rhythm: Normal rate and regular rhythm.     Heart sounds: Normal heart sounds.  Pulmonary:     Effort: Pulmonary effort is normal.     Breath sounds: Normal breath sounds.  Abdominal:     General: Bowel sounds are normal.     Palpations: Abdomen is soft.  Musculoskeletal:        General: No tenderness or deformity. Normal range of motion.     Cervical back: Normal range of motion.     Comments: Extremities shows some 1+ edema in her lower legs.  She has good range of motion of her joints.  She has good strength in upper and lower extremities.  Lymphadenopathy:     Cervical: No cervical adenopathy.  Skin:    General: Skin is warm and dry.     Findings: No erythema or rash.  Neurological:     Mental Status: She is alert and oriented to person, place, and time.  Psychiatric:        Behavior: Behavior normal.        Thought Content: Thought content normal.        Judgment: Judgment normal.      Lab Results  Component Value Date   WBC 3.4 (L) 08/23/2019   HGB 10.3 (L) 08/23/2019   HCT 32.2 (L) 08/23/2019   MCV 91.5 08/23/2019   PLT 122 (L) 08/23/2019     Chemistry      Component Value Date/Time   NA 138 08/23/2019 1513   K 3.9 08/23/2019 1513   CL 102 08/23/2019 1513   CO2 28 08/23/2019 1513   BUN 8 08/23/2019 1513   CREATININE 0.77 08/23/2019 1513   CREATININE 0.70 09/25/2015 1504      Component Value Date/Time   CALCIUM 9.3 08/23/2019 1513   ALKPHOS 79 08/23/2019 1513   AST 57 (H) 08/23/2019 1513   ALT 35 08/23/2019 1513   BILITOT 0.6 08/23/2019 1513       Impression and Plan: Vicki [name] is a 59 year old African-American female.  She has acquired TTP.  She had relapsed.  She is responding incredibly well.  Her platelet count today is 103,000.   Again, her platelet count will never be normal secondary to the NASH.  We will see what her LDH is.  We will have to see how she does.  Hopefully, there will not be any issues with  relapse.  I am just glad that she is doing so well right now.  I still think that her prognosis is good to be dictated by the diabetes and the fact that this is still not under very good control.  I will plan to see her back now in about 6 weeks.  She certainly knows that she come back sooner if she has any problems with bruising or bleeding.  Volanda Napoleon, MD 6/18/20214:27 PM

## 2019-08-23 NOTE — Telephone Encounter (Signed)
[facility] - Client Nonclinical Telephone Record AccessNurse Client [name] - Client Client Site [place] Physician [name]- MD Contact Type Call Who Is Calling Patient / Member / Family / Caregiver Caller Name Vicki [name] Phone Number [phone] Call Type Message Only Information Provided Reason for Call Returning a Call from the Office Initial [name] states she missed call from office Additional Comment Disp. Time Disposition Final User 08/22/2019 5:18:02 PM General Information Provided Yes [name] Call Closed By: [name] Transaction Date/Time: 08/22/2019 5:16:40 PM (ET)

## 2019-08-26 LAB — LACTATE DEHYDROGENASE: LDH: 164 U/L (ref 98–192)

## 2019-08-28 NOTE — Telephone Encounter (Signed)
Left 2nd VM for patient to return phone call.

## 2019-08-29 ENCOUNTER — Encounter: Payer: Self-pay | Admitting: Family Medicine

## 2019-08-29 ENCOUNTER — Other Ambulatory Visit: Payer: Self-pay

## 2019-08-29 ENCOUNTER — Telehealth: Payer: Self-pay | Admitting: *Deleted

## 2019-08-29 ENCOUNTER — Ambulatory Visit (INDEPENDENT_AMBULATORY_CARE_PROVIDER_SITE_OTHER): Payer: Federal, State, Local not specified - PPO | Admitting: Family Medicine

## 2019-08-29 VITALS — BP 120/72 | HR 83 | Temp 98.6°F | Wt 153.1 lb

## 2019-08-29 DIAGNOSIS — E119 Type 2 diabetes mellitus without complications: Secondary | ICD-10-CM | POA: Diagnosis not present

## 2019-08-29 DIAGNOSIS — L659 Nonscarring hair loss, unspecified: Secondary | ICD-10-CM

## 2019-08-29 LAB — POCT GLYCOSYLATED HEMOGLOBIN (HGB A1C): Hemoglobin A1C: 6 % — AB (ref 4.0–5.6)

## 2019-08-29 NOTE — Telephone Encounter (Signed)
Per Dr. Einar Pheasant she just saw pt on 08/15/19 so she didn't need this 6 month f/u appt. Called pt and she said she did have a few questions she was going to ask at this appt. Pt said if Dr. Einar Pheasant can answer them she will not come in unless Dr. Einar Pheasant thinks she still needs too.  Pt said oncologist recommended pt to take 5000 mcg of B12. Pt wanted to know what brand Dr. Einar Pheasant recommends or what she thought about her taking that much B12.  Pt also wanted to know the results of her recent labs.   She also asked about her Hep C test, I did advise pt that Hep C lab was neg.  Pt then asked what her A1c was from last visit. I don't see that once was done. Last A1c in chart was from March. Pt said she is sure Dr. Einar Pheasant did an A1c last time and wants her to look into that. Pt said she knows she needs an A1c so if it wasn't done what should she do

## 2019-08-29 NOTE — Assessment & Plan Note (Signed)
Anemia makes interpretation of hgb a1c difficult, though this is improving and 6.0 today. Discussed monitoring pre/post prandial and discussed goals. She will reach out in 2 weeks if out of range. Fasting are at goal. Continue januvia and trulicity.

## 2019-08-29 NOTE — Patient Instructions (Addendum)
Check sugars  2 hours after meal: CBG <180   Fasting and before meals CBG: 80-130  Try to check before and 2 hours after -- 1 meal every day  If in the next 2 weeks, you notice that your sugars are high call   Lab Results  Component Value Date   HGBA1C 6.0 (A) 08/29/2019     Lab Results  Component Value Date   WBC 3.4 (L) 08/23/2019   HGB 10.3 (L) 08/23/2019   HCT 32.2 (L) 08/23/2019   MCV 91.5 08/23/2019   PLT 122 (L) 08/23/2019   CMP     Component Value Date/Time   NA 138 08/23/2019 1513   K 3.9 08/23/2019 1513   CL 102 08/23/2019 1513   CO2 28 08/23/2019 1513   GLUCOSE 163 (H) 08/23/2019 1513   BUN 8 08/23/2019 1513   CREATININE 0.77 08/23/2019 1513   CREATININE 0.70 09/25/2015 1504   CALCIUM 9.3 08/23/2019 1513   PROT 7.8 08/23/2019 1513   ALBUMIN 3.8 08/23/2019 1513   AST 57 (H) 08/23/2019 1513   ALT 35 08/23/2019 1513   ALKPHOS 79 08/23/2019 1513   BILITOT 0.6 08/23/2019 1513   GFRNONAA >60 08/23/2019 1513   GFRAA >60 08/23/2019 1513

## 2019-08-29 NOTE — Assessment & Plan Note (Signed)
Notes hair loss and bald spot on forehead. Will get TSH today to rule out thyroid disorder. Already following with dermatology so advised follow-up with them if blood work normal. Also discussed that given her anemia and nutrient changes this could be impacting.

## 2019-08-29 NOTE — Progress Notes (Signed)
Subjective:     Vicki [name] is a 59 y.o. female presenting for Follow-up (6 month) and Hair/Scalp Problem (hair thinning)     HPI   #Diabetes Currently taking januvia and trulicity  Using medications without difficulties: yes Hypoglycemic episodes:No  Hyperglycemic episodes:No  Feet problems:No   Fasting this morning: 115    Last HgbA1c:  Lab Results  Component Value Date   HGBA1C 6.0 (A) 08/29/2019    Diabetes Health Maintenance Due:    Diabetes Health Maintenance Due  Topic Date Due  . URINE MICROALBUMIN  02/26/2020  . HEMOGLOBIN A1C  02/28/2020  . OPHTHALMOLOGY EXAM  06/27/2020  . FOOT EXAM  08/14/2020    #Hair loss - running fingers through hair and getting a handful of hair - thin around the front - wearing a wig today - no changes in nails or skin - still getting some boils in thigh area -   Review of Systems   Social History   Tobacco Use  Smoking Status Never Smoker  Smokeless Tobacco Never Used        Objective:    BP Readings from Last 3 Encounters:  08/29/19 120/72  08/23/19 124/65  08/15/19 136/80   Wt Readings from Last 3 Encounters:  08/29/19 153 lb 2 oz (69.5 kg)  08/23/19 154 lb (69.9 kg)  08/15/19 152 lb (68.9 kg)    BP 120/72   Pulse 83   Temp 98.6 F (37 C) (Oral)   Wt 153 lb 2 oz (69.5 kg)   LMP 04/30/2012   SpO2 99%   BMI 28.93 kg/m    Physical Exam Constitutional:      General: She is not in acute distress.    Appearance: She is well-developed. She is not diaphoretic.  HENT:     Right Ear: External ear normal.     Left Ear: External ear normal.     Nose: Nose normal.  Eyes:     Conjunctiva/sclera: Conjunctivae normal.  Cardiovascular:     Rate and Rhythm: Normal rate.  Pulmonary:     Effort: Pulmonary effort is normal.  Musculoskeletal:     Cervical back: Neck supple.  Skin:    General: Skin is warm and dry.     Capillary Refill: Capillary refill takes less than 2 seconds.      Comments: Scalp with thinning hair and bald spot near the top left forehead.   Neurological:     Mental Status: She is alert. Mental status is at baseline.  Psychiatric:        Mood and Affect: Mood normal.        Behavior: Behavior normal.           Assessment & Plan:   Problem List Items Addressed This Visit      Endocrine   Controlled type 2 diabetes mellitus without complication (HCC) - Primary (Chronic)    Anemia makes interpretation of hgb a1c difficult, though this is improving and 6.0 today. Discussed monitoring pre/post prandial and discussed goals. She will reach out in 2 weeks if out of range. Fasting are at goal. Continue januvia and trulicity.       Relevant Orders   POCT glycosylated hemoglobin (Hb A1C) (Completed)     Other   Hair loss    Notes hair loss and bald spot on forehead. Will get TSH today to rule out thyroid disorder. Already following with dermatology so advised follow-up with them if blood work normal. Also discussed that given  her anemia and nutrient changes this could be impacting.        Relevant Orders   TSH       Return in about 3 months (around 11/29/2019) for DM.  Lesleigh Noe, MD  This visit occurred during the SARS-CoV-2 public health emergency.  Safety protocols were in place, including screening questions prior to the visit, additional usage of staff PPE, and extensive cleaning of exam room while observing appropriate contact time as indicated for disinfecting solutions.

## 2019-08-29 NOTE — Telephone Encounter (Signed)
Noted will review today

## 2019-08-29 NOTE — Telephone Encounter (Signed)
Pt has an appt with PCP today, will address with her

## 2019-08-30 ENCOUNTER — Telehealth: Payer: Self-pay | Admitting: *Deleted

## 2019-08-30 LAB — TSH: TSH: 0.72 u[IU]/mL (ref 0.35–4.50)

## 2019-08-30 NOTE — Telephone Encounter (Signed)
Patient called and left a voice mail stating,"I need to have a dental cleaning and maybe a tooth extracted, are there any precautions I need to take?" return number is [phone].

## 2019-08-31 ENCOUNTER — Other Ambulatory Visit: Payer: Self-pay | Admitting: Hematology & Oncology

## 2019-08-31 LAB — ADAMTS13 ACTIVITY REFLEX

## 2019-08-31 LAB — ADAMTS13 ACTIVITY: Adamts 13 Activity: 95.7 % (ref >66.8)

## 2019-09-10 ENCOUNTER — Telehealth: Payer: Self-pay

## 2019-09-10 NOTE — Telephone Encounter (Signed)
Medication sent to the pharmacy on 08/19/2019 after labs done on 08/15/2019 with elevated fructosamine. Recommended increasing her dose at that time.   Has she been checking blood sugar?   If she has been meeting the goals below on the 0.75 dose, then I can send in the old prescription. If her blood sugar has been out of range would recommend the higher dose and to call if getting low blood sugar.   2 hours after meal: CBG <180   Fasting and before meals CBG: 80-130

## 2019-09-10 NOTE — Telephone Encounter (Signed)
Pt left v/m that pt had picked up refill on trulicity and this time the truclicity is 1.5 mg; pt wanted to know if dosage had been increased or did pharmacy make an error. Pt had been getting trulicity 1.84 mg. Pt request cb after review from Dr Einar Pheasant.

## 2019-09-11 NOTE — Telephone Encounter (Signed)
Spoke to pt again and clarified with her that she will be taking the higher dose of trulicity and monitoring her BS to see if she gets within the ranges that Dr. Einar Pheasant would like her to be.  Also told pt that she will need to contact her dermatologist in reference to the boils. She understood.

## 2019-09-11 NOTE — Telephone Encounter (Signed)
Spoke to pt and she says she hasn't been checking her BS that often. I notified her of Dr. Verda Cumins range and med advice. She says she will start checking her BS more often.  In addition, Pt states that she has boils in her groin area again and wanted to know if an antibiotic can be called in or does she need to call her dermatologist. Please advise.

## 2019-09-11 NOTE — Telephone Encounter (Signed)
Would recommend she call her dermatologist regarding boils as most recently treated her.   Also, did you clarify if she will start the higher dose?

## 2019-09-18 ENCOUNTER — Encounter: Payer: Self-pay | Admitting: Obstetrics & Gynecology

## 2019-09-18 ENCOUNTER — Other Ambulatory Visit: Payer: Self-pay

## 2019-09-18 ENCOUNTER — Ambulatory Visit (INDEPENDENT_AMBULATORY_CARE_PROVIDER_SITE_OTHER): Payer: Federal, State, Local not specified - PPO | Admitting: Obstetrics & Gynecology

## 2019-09-18 VITALS — BP 124/74 | Ht 61.0 in | Wt 155.0 lb

## 2019-09-18 DIAGNOSIS — N6011 Diffuse cystic mastopathy of right breast: Secondary | ICD-10-CM | POA: Diagnosis not present

## 2019-09-18 DIAGNOSIS — Z13820 Encounter for screening for osteoporosis: Secondary | ICD-10-CM

## 2019-09-18 DIAGNOSIS — Z01419 Encounter for gynecological examination (general) (routine) without abnormal findings: Secondary | ICD-10-CM | POA: Diagnosis not present

## 2019-09-18 DIAGNOSIS — N6012 Diffuse cystic mastopathy of left breast: Secondary | ICD-10-CM

## 2019-09-18 DIAGNOSIS — Z780 Asymptomatic menopausal state: Secondary | ICD-10-CM

## 2019-09-18 LAB — RESULTS CONSOLE HPV: CHL HPV: NEGATIVE

## 2019-09-18 LAB — HM PAP SMEAR: HM Pap smear: NEGATIVE

## 2019-09-18 NOTE — Progress Notes (Signed)
Vicki [name] March 17, 1960 [id number]   History:    59 y.o. G2P1A1L1  RP:  Established patient presenting for annual gyn exam   HPI: Postmenopausal, well on no hormone replacement therapy.  No postmenopausal bleeding.  No pelvic pain.  No pain with intercourse.  Breasts normal.  Urine and bowel movements normal.  Body mass index 29.29.  Health labs with family physician.  Colonoscopy in 2014.  Past medical history,surgical history, family history and social history were all reviewed and documented in the EPIC chart.  Gynecologic History Patient's last menstrual period was 04/30/2012.  Obstetric History OB History  Gravida Para Term Preterm AB Living  1 0 0 0 1 1  SAB TAB Ectopic Multiple Live Births  0 0 1 0 0    # Outcome Date GA Lbr Len/2nd Weight Sex Delivery Anes PTL Lv  1 Ectopic              ROS: A ROS was performed and pertinent positives and negatives are included in the history.  GENERAL: No fevers or chills. HEENT: No change in vision, no earache, sore throat or sinus congestion. NECK: No pain or stiffness. CARDIOVASCULAR: No chest pain or pressure. No palpitations. PULMONARY: No shortness of breath, cough or wheeze. GASTROINTESTINAL: No abdominal pain, nausea, vomiting or diarrhea, melena or bright red blood per rectum. GENITOURINARY: No urinary frequency, urgency, hesitancy or dysuria. MUSCULOSKELETAL: No joint or muscle pain, no back pain, no recent trauma. DERMATOLOGIC: No rash, no itching, no lesions. ENDOCRINE: No polyuria, polydipsia, no heat or cold intolerance. No recent change in weight. HEMATOLOGICAL: No anemia or easy bruising or bleeding. NEUROLOGIC: No headache, seizures, numbness, tingling or weakness. PSYCHIATRIC: No depression, no loss of interest in normal activity or change in sleep pattern.     Exam:   BP 124/74 (BP Location: Right Arm, Patient Position: Sitting, Cuff Size: Normal)   Ht 5' 1"  (1.549 m)   Wt 155 lb (70.3 kg)   LMP 04/30/2012    BMI 29.29 kg/m   Body mass index is 29.29 kg/m.  General appearance : Well developed well nourished female. No acute distress HEENT: Eyes: no retinal hemorrhage or exudates,  Neck supple, trachea midline, no carotid bruits, no thyroidmegaly Lungs: Clear to auscultation, no rhonchi or wheezes, or rib retractions  Heart: Regular rate and rhythm, no murmurs or gallops Breast:Examined in sitting and supine position were symmetrical in appearance,  palpable masses and tenderness bilaterally:  Rt at 4 and 5 O'Clock, mobile nodules 1-2 cm.  Lt at 8 O'Cock 2 cm mobile nodule.  No skin retraction, no nipple inversion, no nipple discharge, no skin discoloration, no axillary or supraclavicular lymphadenopathy Abdomen: no palpable masses or tenderness, no rebound or guarding Extremities: no edema or skin discoloration or tenderness  Pelvic: Vulva: Normal             Vagina: No gross lesions or discharge  Cervix: No gross lesions or discharge.  Pap reflex done.  Uterus  AV, normal size, shape and consistency, non-tender and mobile  Adnexa  Without masses or tenderness  Anus: Normal   Assessment/Plan:  59 y.o. female for annual exam   1. Encounter for routine gynecological examination with Papanicolaou smear of cervix Normal gynecologic exam.  Pap reflex done.  Breast exam showing very dense breasts with probable fibrocystic disease.  Decision to schedule a bilateral diagnostic mammogram and ultrasound. - Pap IG w/ reflex to HPV when ASC-U  2. Fibrocystic breast changes of both  breasts Bilateral tender breast with probable fibrocystic disease.  Rule out breast masses/breast cancer.  Will schedule Bilateral Dx mammo/US.  3. Postmenopause Well on no hormone replacement therapy.  No postmenopausal bleeding.  4. Screening for osteoporosis Schedule bone density here.  Vitamin D supplements, calcium intake of 1200 mg daily and regular weightbearing physical activity is recommended. - DG Bone Density;  Future  Princess Bruins MD, 4:19 PM 09/18/2019

## 2019-09-19 ENCOUNTER — Other Ambulatory Visit: Payer: Self-pay | Admitting: Obstetrics & Gynecology

## 2019-09-19 ENCOUNTER — Telehealth: Payer: Self-pay | Admitting: *Deleted

## 2019-09-19 DIAGNOSIS — N63 Unspecified lump in unspecified breast: Secondary | ICD-10-CM

## 2019-09-19 LAB — PAP IG W/ RFLX HPV ASCU

## 2019-09-19 NOTE — Telephone Encounter (Signed)
Patient scheduled at the breast center on 10/07/19 @ 3:00pm, left detailed message, asked her to let me know she received my message.

## 2019-09-19 NOTE — Telephone Encounter (Signed)
-----   Message from Princess Bruins, MD sent at 09/18/2019  4:57 PM EDT ----- Regarding: Schedule Bilateral Diagnostic Mammo/US of breasts Bilateral tender nodules, Rt at 4 and 5 O'Clock and Lt at 8 O'Clock.

## 2019-09-19 NOTE — Telephone Encounter (Signed)
Patient called back stating she received the message.

## 2019-09-26 ENCOUNTER — Encounter: Payer: Self-pay | Admitting: Obstetrics & Gynecology

## 2019-09-30 ENCOUNTER — Other Ambulatory Visit: Payer: Self-pay

## 2019-09-30 ENCOUNTER — Other Ambulatory Visit: Payer: Self-pay | Admitting: Hematology & Oncology

## 2019-09-30 ENCOUNTER — Inpatient Hospital Stay: Payer: Federal, State, Local not specified - PPO | Attending: Hematology

## 2019-09-30 ENCOUNTER — Inpatient Hospital Stay (HOSPITAL_BASED_OUTPATIENT_CLINIC_OR_DEPARTMENT_OTHER): Payer: Federal, State, Local not specified - PPO | Admitting: Hematology & Oncology

## 2019-09-30 ENCOUNTER — Encounter: Payer: Self-pay | Admitting: Hematology & Oncology

## 2019-09-30 ENCOUNTER — Telehealth: Payer: Self-pay | Admitting: Hematology & Oncology

## 2019-09-30 ENCOUNTER — Other Ambulatory Visit: Payer: Self-pay | Admitting: *Deleted

## 2019-09-30 VITALS — BP 147/72 | HR 84 | Temp 98.2°F | Resp 16 | Wt 156.0 lb

## 2019-09-30 DIAGNOSIS — K7581 Nonalcoholic steatohepatitis (NASH): Secondary | ICD-10-CM

## 2019-09-30 DIAGNOSIS — D61818 Other pancytopenia: Secondary | ICD-10-CM | POA: Diagnosis not present

## 2019-09-30 DIAGNOSIS — K7460 Unspecified cirrhosis of liver: Secondary | ICD-10-CM

## 2019-09-30 DIAGNOSIS — M791 Myalgia, unspecified site: Secondary | ICD-10-CM | POA: Insufficient documentation

## 2019-09-30 DIAGNOSIS — M311 Thrombotic microangiopathy: Secondary | ICD-10-CM | POA: Diagnosis not present

## 2019-09-30 DIAGNOSIS — Z79899 Other long term (current) drug therapy: Secondary | ICD-10-CM | POA: Insufficient documentation

## 2019-09-30 DIAGNOSIS — M7989 Other specified soft tissue disorders: Secondary | ICD-10-CM | POA: Diagnosis not present

## 2019-09-30 DIAGNOSIS — M2550 Pain in unspecified joint: Secondary | ICD-10-CM | POA: Diagnosis not present

## 2019-09-30 DIAGNOSIS — Z882 Allergy status to sulfonamides status: Secondary | ICD-10-CM | POA: Diagnosis not present

## 2019-09-30 DIAGNOSIS — E119 Type 2 diabetes mellitus without complications: Secondary | ICD-10-CM | POA: Insufficient documentation

## 2019-09-30 DIAGNOSIS — M3119 Other thrombotic microangiopathy: Secondary | ICD-10-CM

## 2019-09-30 LAB — CMP (CANCER CENTER ONLY)
ALT: 29 U/L (ref 0–44)
AST: 41 U/L (ref 15–41)
Albumin: 4.2 g/dL (ref 3.5–5.0)
Alkaline Phosphatase: 73 U/L (ref 38–126)
Anion gap: 6 (ref 5–15)
BUN: 9 mg/dL (ref 6–20)
CO2: 28 mmol/L (ref 22–32)
Calcium: 10.4 mg/dL — ABNORMAL HIGH (ref 8.9–10.3)
Chloride: 103 mmol/L (ref 98–111)
Creatinine: 0.86 mg/dL (ref 0.44–1.00)
GFR, Est AFR Am: >60 mL/min (ref >60)
GFR, Estimated: >60 mL/min (ref >60)
Glucose, Bld: 177 mg/dL — ABNORMAL HIGH (ref 70–99)
Potassium: 3.9 mmol/L (ref 3.5–5.1)
Sodium: 137 mmol/L (ref 135–145)
Total Bilirubin: 0.6 mg/dL (ref 0.3–1.2)
Total Protein: 8.2 g/dL — ABNORMAL HIGH (ref 6.5–8.1)

## 2019-09-30 LAB — CBC WITH DIFFERENTIAL (CANCER CENTER ONLY)
Abs Immature Granulocytes: 0.04 10*3/uL (ref 0.00–0.07)
Basophils Absolute: 0 10*3/uL (ref 0.0–0.1)
Basophils Relative: 1 %
Eosinophils Absolute: 0.6 10*3/uL — ABNORMAL HIGH (ref 0.0–0.5)
Eosinophils Relative: 23 %
HCT: 36.6 % (ref 36.0–46.0)
Hemoglobin: 11.8 g/dL — ABNORMAL LOW (ref 12.0–15.0)
Immature Granulocytes: 2 %
Lymphocytes Relative: 26 %
Lymphs Abs: 0.7 10*3/uL (ref 0.7–4.0)
MCH: 28.7 pg (ref 26.0–34.0)
MCHC: 32.2 g/dL (ref 30.0–36.0)
MCV: 89.1 fL (ref 80.0–100.0)
Monocytes Absolute: 0.2 10*3/uL (ref 0.1–1.0)
Monocytes Relative: 7 %
Neutro Abs: 1.1 10*3/uL — ABNORMAL LOW (ref 1.7–7.7)
Neutrophils Relative %: 41 %
Platelet Count: 116 10*3/uL — ABNORMAL LOW (ref 150–400)
RBC: 4.11 MIL/uL (ref 3.87–5.11)
RDW: 13.7 % (ref 11.5–15.5)
WBC Count: 2.6 10*3/uL — ABNORMAL LOW (ref 4.0–10.5)
nRBC: 0 % (ref 0.0–0.2)

## 2019-09-30 LAB — SAVE SMEAR(SSMR), FOR PROVIDER SLIDE REVIEW

## 2019-09-30 LAB — VITAMIN B12: Vitamin B-12: 3957 pg/mL — ABNORMAL HIGH (ref 180–914)

## 2019-09-30 LAB — RETICULOCYTES
Immature Retic Fract: 6.7 % (ref 2.3–15.9)
RBC.: 4.04 MIL/uL (ref 3.87–5.11)
Retic Count, Absolute: 33.5 10*3/uL (ref 19.0–186.0)
Retic Ct Pct: 0.8 % (ref 0.4–3.1)

## 2019-09-30 LAB — LACTATE DEHYDROGENASE: LDH: 142 U/L (ref 98–192)

## 2019-09-30 MED ORDER — MOMETASONE FUROATE 0.1 % EX CREA: 1.0000 "application " | TOPICAL_CREAM | Freq: Every day | CUTANEOUS | 0 refills | Status: DC

## 2019-09-30 NOTE — Progress Notes (Signed)
Hematology and Oncology Follow Up Visit  Vicki [name] [id number] 06/19/1960 59 y.o. 09/30/2019   Principle Diagnosis:   TTP - acquired -- relapsed  NASH -- leukopenia/thrombocytopenia  Current Therapy:    Plasma Exchange - M-Th -- d/c on 08/01/2019  Rituxan 375 mg/m2 IV weekly -- s/p cycle #4     Interim History:  Vicki [name] is back for her follow-up.  As expected, she is doing quite well.  She had a good weekend.  She still has the NASH.  I told her that she will always have this and this will not get better.  Hopefully will not get worse.  As long she has diabetes, I do not think that the NASH will improve.  As far as the TTP is concerned, she is still in remission.  Her last ADAMTS-13 was 96%.  She has had no problems with fever.  She had no bleeding.  She has had no cough or shortness of breath.  She wanted to know about the coronavirus vaccine.  I told her that I thought it would be okay if she had this.  I do not see a real contraindication for her to have this.  She has had no issues with nausea or vomiting.  There is been no change in bowel or bladder habits.  She has had no leg swelling.  Overall, her performance status is ECOG 1..     Medications:  Current Outpatient Medications:  .  Alum Hydroxide-Mag Carbonate (GAVISCON EXTRA STRENGTH PO), Take 1 tablet by mouth 4 (four) times daily as needed., Disp: , Rfl:  .  cholecalciferol (VITAMIN D3) 25 MCG (1000 UT) tablet, Take 1,000 Units by mouth daily., Disp: , Rfl:  .  dextromethorphan-guaiFENesin (MUCINEX DM) 30-600 MG 12hr tablet, Take 2 tablets by mouth 2 (two) times daily as needed for cough., Disp: , Rfl:  .  Dulaglutide (TRULICITY) 1.5 YP/9.5KD SOPN, Inject 0.5 mLs (1.5 mg total) into the skin once a week., Disp: 4 pen, Rfl: 3 .  folic acid (FOLVITE) 1 MG tablet, Take 2 tablets (2 mg total) by mouth daily., Disp: 60 tablet, Rfl: 12 .  furosemide (LASIX) 40 MG tablet, Take 1 tablet by mouth once daily,  Disp: 30 tablet, Rfl: 0 .  glucose blood test strip, One touch ultra, Disp: , Rfl:  .  Multiple Vitamins-Minerals (MULTI FOR HER 50+) TABS, Take 1 tablet by mouth daily., Disp: , Rfl:  .  potassium chloride (KLOR-CON) 10 MEQ tablet, Take 1 tablet by mouth once daily, Disp: 30 tablet, Rfl: 0 .  senna-docusate (SENOKOT-S) 8.6-50 MG tablet, Take 2 tablets by mouth 2 (two) times daily., Disp: 20 tablet, Rfl: 0 .  sitaGLIPtin (JANUVIA) 100 MG tablet, Take 1 tablet (100 mg total) by mouth daily., Disp: 90 tablet, Rfl: 3 .  traMADol (ULTRAM) 50 MG tablet, Take by mouth., Disp: , Rfl:   Allergies:  Allergies  Allergen Reactions  . Metformin And Related Itching    Heart palpitation  . Sulfa Antibiotics Itching  . Sulfasalazine Itching    Past Medical History, Surgical history, Social history, and Family History were reviewed and updated.  Review of Systems: Review of Systems  Constitutional: Negative.   HENT:  Negative.   Eyes: Negative.   Respiratory: Negative.   Cardiovascular: Positive for leg swelling.  Gastrointestinal: Negative.   Endocrine: Negative.   Genitourinary: Negative.    Musculoskeletal: Positive for arthralgias and myalgias.  Skin: Negative.   Neurological: Negative.   Hematological: Negative.  Psychiatric/Behavioral: Negative.     Physical Exam:  weight is 156 lb (70.8 kg). Her oral temperature is 98.2 F (36.8 C). Her blood pressure is 147/72 (abnormal) and her pulse is 84. Her respiration is 16 and oxygen saturation is 100%.   Wt Readings from Last 3 Encounters:  09/30/19 156 lb (70.8 kg)  09/18/19 155 lb (70.3 kg)  08/29/19 153 lb 2 oz (69.5 kg)    Physical Exam Vitals reviewed.  HENT:     Head: Normocephalic and atraumatic.  Eyes:     Pupils: Pupils are equal, round, and reactive to light.  Cardiovascular:     Rate and Rhythm: Normal rate and regular rhythm.     Heart sounds: Normal heart sounds.  Pulmonary:     Effort: Pulmonary effort is normal.      Breath sounds: Normal breath sounds.  Abdominal:     General: Bowel sounds are normal.     Palpations: Abdomen is soft.  Musculoskeletal:        General: No tenderness or deformity. Normal range of motion.     Cervical back: Normal range of motion.     Comments: Extremities shows some 1+ edema in her lower legs.  She has good range of motion of her joints.  She has good strength in upper and lower extremities.  Lymphadenopathy:     Cervical: No cervical adenopathy.  Skin:    General: Skin is warm and dry.     Findings: No erythema or rash.  Neurological:     Mental Status: She is alert and oriented to person, place, and time.  Psychiatric:        Behavior: Behavior normal.        Thought Content: Thought content normal.        Judgment: Judgment normal.      Lab Results  Component Value Date   WBC 2.6 (L) 09/30/2019   HGB 11.8 (L) 09/30/2019   HCT 36.6 09/30/2019   MCV 89.1 09/30/2019   PLT 116 (L) 09/30/2019     Chemistry      Component Value Date/Time   NA 137 09/30/2019 1144   K 3.9 09/30/2019 1144   CL 103 09/30/2019 1144   CO2 28 09/30/2019 1144   BUN 9 09/30/2019 1144   CREATININE 0.86 09/30/2019 1144   CREATININE 0.70 09/25/2015 1504      Component Value Date/Time   CALCIUM 10.4 (H) 09/30/2019 1144   ALKPHOS 73 09/30/2019 1144   AST 41 09/30/2019 1144   ALT 29 09/30/2019 1144   BILITOT 0.6 09/30/2019 1144       Impression and Plan: Vicki [name] is a 59 year old African-American female.  She has acquired TTP.  She had relapsed.  She is responding incredibly well.  Her platelet count today is 103,000.   Again, her platelet count will never be normal secondary to the NASH.  I think that her blood counts will clearly tell us what is going on.  Again, everything looks fine with her blood counts.  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  We will see what the ADAMTS-13 shows.  I think we can now get her back in 2 months.  We will  gradually move her appointments out.    Volanda Napoleon, MD 7/26/202112:53 PM

## 2019-09-30 NOTE — Telephone Encounter (Signed)
Appointments scheduled calendar printed .  I also sent her a email to set up My Chart as requested per 7/26 los

## 2019-10-01 LAB — IRON AND TIBC
Iron: 80 ug/dL (ref 41–142)
Saturation Ratios: 25 % (ref 21–57)
TIBC: 316 ug/dL (ref 236–444)
UIBC: 236 ug/dL (ref 120–384)

## 2019-10-01 LAB — FERRITIN: Ferritin: 45 ng/mL (ref 11–307)

## 2019-10-03 LAB — ADAMTS13 ACTIVITY: Adamts 13 Activity: 78.5 % (ref >66.8)

## 2019-10-03 LAB — ADAMTS13 ACTIVITY REFLEX

## 2019-10-07 ENCOUNTER — Ambulatory Visit
Admission: RE | Admit: 2019-10-07 | Discharge: 2019-10-07 | Disposition: A | Discharge status: Home / Self Care | Payer: Federal, State, Local not specified - PPO | Source: Ambulatory Visit | Attending: Obstetrics & Gynecology | Admitting: Obstetrics & Gynecology

## 2019-10-07 ENCOUNTER — Other Ambulatory Visit: Payer: Self-pay

## 2019-10-07 DIAGNOSIS — N6489 Other specified disorders of breast: Secondary | ICD-10-CM | POA: Diagnosis not present

## 2019-10-07 DIAGNOSIS — N6310 Unspecified lump in the right breast, unspecified quadrant: Secondary | ICD-10-CM | POA: Diagnosis not present

## 2019-10-07 DIAGNOSIS — N63 Unspecified lump in unspecified breast: Secondary | ICD-10-CM

## 2019-10-07 DIAGNOSIS — N6320 Unspecified lump in the left breast, unspecified quadrant: Secondary | ICD-10-CM | POA: Diagnosis not present

## 2019-10-07 DIAGNOSIS — R922 Inconclusive mammogram: Secondary | ICD-10-CM | POA: Diagnosis not present

## 2019-10-18 ENCOUNTER — Telehealth: Payer: Self-pay | Admitting: Family Medicine

## 2019-10-18 NOTE — Telephone Encounter (Signed)
This is excellent control. I would advise continuing both medications for now. If she is feeling symptoms of low blood sugar with her current levels then we could consider dose adjustment.

## 2019-10-18 NOTE — Telephone Encounter (Signed)
Patient called to schedule f/u appointment in December.  Patient said she checks her sugar a couple hours after she eats. Some of her sugar readings are 7/30-112 am, 119- afternoon, 7/31-113 am, 71- afternoon 8/1 131 am,  8/2 103-afternoon, 8/3-105 afternoon. 8/4-111 am,146 afternoon-this morning it was 100.  Patient wants to know if Dr.Cody wants her to continue the high dose Trulicity or go back to low dose and continue Januvia?  Patient uses [facility] on [address].

## 2019-10-21 NOTE — Telephone Encounter (Signed)
Spoke to pt and relayed Dr. Verda Cumins advice to stay on the current dose of Trulicity as well as the Januvia. Currently she is not experiencing any symptoms of low blood sugar, but will let us know if she starts to.

## 2019-10-25 ENCOUNTER — Ambulatory Visit: Payer: Federal, State, Local not specified - PPO | Admitting: Podiatry

## 2019-10-28 ENCOUNTER — Telehealth: Payer: Self-pay | Admitting: *Deleted

## 2019-10-28 DIAGNOSIS — E119 Type 2 diabetes mellitus without complications: Secondary | ICD-10-CM

## 2019-10-28 MED ORDER — TRULICITY 0.75 MG/0.5ML ~~LOC~~ SOAJ: 0.7500 mg | SUBCUTANEOUS | 2 refills | Status: DC

## 2019-10-28 NOTE — Telephone Encounter (Signed)
New medication dose sent to pharmacy

## 2019-10-28 NOTE — Telephone Encounter (Signed)
Patient left a voicemail wanting to know if Dr. Einar Pheasant would lower her Trulicity dose. Patient stated that the dose that she is on makes her stomach and side hurt. Pharmacy [facility]

## 2019-10-29 ENCOUNTER — Other Ambulatory Visit: Payer: Self-pay | Admitting: Hematology & Oncology

## 2019-10-29 NOTE — Telephone Encounter (Signed)
Spoke to pt and let her know that a new dose of trulicity was sent into her pharmacy.

## 2019-10-30 ENCOUNTER — Telehealth: Payer: Self-pay | Admitting: Cardiovascular Disease

## 2019-10-30 ENCOUNTER — Other Ambulatory Visit: Payer: Federal, State, Local not specified - PPO

## 2019-10-30 ENCOUNTER — Telehealth: Payer: Self-pay | Admitting: Family Medicine

## 2019-10-30 ENCOUNTER — Telehealth: Payer: Self-pay | Admitting: *Deleted

## 2019-10-30 ENCOUNTER — Other Ambulatory Visit: Payer: Self-pay | Admitting: Critical Care Medicine

## 2019-10-30 DIAGNOSIS — Z20822 Contact with and (suspected) exposure to covid-19: Secondary | ICD-10-CM

## 2019-10-30 NOTE — Telephone Encounter (Signed)
Patient states she has been experiencing aching and fluttering around her heart, on and off, for the past week. She states the aching is accompanied by headaches. Denies chest pain and additional symptoms. Please call to discuss.

## 2019-10-30 NOTE — Telephone Encounter (Signed)
I would recommend she schedule an appointment to be evaluated and labs can be ordered.   I cannot order labs without seeing her.   It sounds like she is waiting to hear from cardiology about whether or not the ER is warranted. Would recommend that she follow their advice - if they suggest the ER she should go. Given her history of anemia requiring intervention this may be necessary as oncology has already recommended ER.   If she does not want to the go to the ER would recommend evaluation as soon as possible either here, with cardiology, or at an urgent care that would be able to do an EKG and order blood work.

## 2019-10-30 NOTE — Telephone Encounter (Signed)
Attempted to reach pt by calling both numbers in pt chart. No answer on either and I left a VM on cell number asking pt to call me back.

## 2019-10-30 NOTE — Telephone Encounter (Signed)
Pt  Called stating she wanted to make appointment with dr cody for lab work  She thinks her blood is low and very tired.  She stated she hurts around her heart at times and she is also having headaches.  There are several message from cardiology/oncology dated 8/25  Per Rollene Fare laws send message to dr cody Rollene Fare will talk to dr cody and we will call pt back.  Pt is aware we will call her back after speaking with dr cody

## 2019-10-30 NOTE — Telephone Encounter (Signed)
Spoke with pt. She states she recently has been feeling very fatigued and having frequent headaches. She states the "fluttering" feels like a fast heart beat that only persists for a couple of seconds at at time. It is not sustained. Her CP is a mild ache that is also transient. She denies SOB, arm, neck or back pain. I reviewed her last monitor report with her and we discussed her PVCs. She also has a low Hgb and she is aware of this.  I advised her to call her hem/onc MD to report her sx as well. I will forward to Dr. Gwenlyn Found for recommendation. She is PRN and wonders if she needs to be seen.   We will contact her back with recommendation from Dr. Gwenlyn Found.

## 2019-10-30 NOTE — Telephone Encounter (Signed)
Call received from patient stating that she is having "heart ache times one week and weakness."  Pt instructed to go to the ER now per order of S. [place] NP.  Pt states that she does not want to go to the ER and will await return call from her Cardiologist office.

## 2019-10-31 LAB — SARS-COV-2, NAA 2 DAY TAT

## 2019-10-31 LAB — NOVEL CORONAVIRUS, NAA: SARS-CoV-2, NAA: NOT DETECTED

## 2019-10-31 NOTE — Telephone Encounter (Signed)
Pt returned your call best number [phone]

## 2019-10-31 NOTE — Telephone Encounter (Signed)
Pt returned my call and says she would like to come see Dr. Einar Pheasant first before any of her other dr's. Alice scheduled follow-up appt.

## 2019-11-02 NOTE — Telephone Encounter (Signed)
Happy to have her see an APP for eval

## 2019-11-05 ENCOUNTER — Other Ambulatory Visit: Payer: Self-pay

## 2019-11-05 ENCOUNTER — Ambulatory Visit (INDEPENDENT_AMBULATORY_CARE_PROVIDER_SITE_OTHER): Payer: Federal, State, Local not specified - PPO

## 2019-11-05 ENCOUNTER — Other Ambulatory Visit: Payer: Self-pay | Admitting: Obstetrics & Gynecology

## 2019-11-05 DIAGNOSIS — M8589 Other specified disorders of bone density and structure, multiple sites: Secondary | ICD-10-CM

## 2019-11-05 DIAGNOSIS — Z780 Asymptomatic menopausal state: Secondary | ICD-10-CM | POA: Diagnosis not present

## 2019-11-05 DIAGNOSIS — Z13820 Encounter for screening for osteoporosis: Secondary | ICD-10-CM

## 2019-11-06 ENCOUNTER — Ambulatory Visit: Payer: Federal, State, Local not specified - PPO | Admitting: Family Medicine

## 2019-11-06 VITALS — BP 128/76 | HR 82 | Temp 97.6°F | Ht 61.0 in | Wt 158.5 lb

## 2019-11-06 DIAGNOSIS — R5383 Other fatigue: Secondary | ICD-10-CM | POA: Insufficient documentation

## 2019-11-06 DIAGNOSIS — W57XXXA Bitten or stung by nonvenomous insect and other nonvenomous arthropods, initial encounter: Secondary | ICD-10-CM | POA: Diagnosis not present

## 2019-11-06 DIAGNOSIS — M311 Thrombotic microangiopathy: Secondary | ICD-10-CM

## 2019-11-06 DIAGNOSIS — S70361A Insect bite (nonvenomous), right thigh, initial encounter: Secondary | ICD-10-CM | POA: Diagnosis not present

## 2019-11-06 DIAGNOSIS — M3119 Other thrombotic microangiopathy: Secondary | ICD-10-CM

## 2019-11-06 LAB — COMPREHENSIVE METABOLIC PANEL
ALT: 29 U/L (ref 0–35)
AST: 44 U/L — ABNORMAL HIGH (ref 0–37)
Albumin: 4.2 g/dL (ref 3.5–5.2)
Alkaline Phosphatase: 98 U/L (ref 39–117)
BUN: 9 mg/dL (ref 6–23)
CO2: 31 mEq/L (ref 19–32)
Calcium: 10.3 mg/dL (ref 8.4–10.5)
Chloride: 101 mEq/L (ref 96–112)
Creatinine, Ser: 0.81 mg/dL (ref 0.40–1.20)
GFR: 87.22 mL/min (ref >60.00)
Glucose, Bld: 154 mg/dL — ABNORMAL HIGH (ref 70–99)
Potassium: 3.8 mEq/L (ref 3.5–5.1)
Sodium: 138 mEq/L (ref 135–145)
Total Bilirubin: 0.6 mg/dL (ref 0.2–1.2)
Total Protein: 7.9 g/dL (ref 6.0–8.3)

## 2019-11-06 LAB — CBC WITH DIFFERENTIAL/PLATELET
Basophils Absolute: 0 10*3/uL (ref 0.0–0.1)
Basophils Relative: 0.8 % (ref 0.0–3.0)
Eosinophils Absolute: 0.7 10*3/uL (ref 0.0–0.7)
Eosinophils Relative: 21.4 % — ABNORMAL HIGH (ref 0.0–5.0)
HCT: 36.1 % (ref 36.0–46.0)
Hemoglobin: 12.2 g/dL (ref 12.0–15.0)
Lymphocytes Relative: 23.3 % (ref 12.0–46.0)
Lymphs Abs: 0.8 10*3/uL (ref 0.7–4.0)
MCHC: 33.8 g/dL (ref 30.0–36.0)
MCV: 88 fl (ref 78.0–100.0)
Monocytes Absolute: 0.3 10*3/uL (ref 0.1–1.0)
Monocytes Relative: 7.7 % (ref 3.0–12.0)
Neutro Abs: 1.6 10*3/uL (ref 1.4–7.7)
Neutrophils Relative %: 46.8 % (ref 43.0–77.0)
Platelets: 124 10*3/uL — ABNORMAL LOW (ref 150.0–400.0)
RBC: 4.1 Mil/uL (ref 3.87–5.11)
RDW: 15 % (ref 11.5–15.5)
WBC: 3.5 10*3/uL — ABNORMAL LOW (ref 4.0–10.5)

## 2019-11-06 NOTE — Assessment & Plan Note (Signed)
No new skin lesions, but with fatigue pt is concerned her blood counts have changed. No abnormal bleeding. Will get labs

## 2019-11-06 NOTE — Patient Instructions (Signed)
Fatigue - blood work today - Consider changing your allergy medication - could try Xyzal or Claritin

## 2019-11-06 NOTE — Progress Notes (Signed)
Subjective:     Vicki [name] is a 59 y.o. female presenting for Follow-up     HPI  #Fatigue x 1 week - has been feeling run down - urine is darker color  - no dysuria - endorses some urinary incontinence  - thinks she is finished urinating but will still have some leakage or have trouble getting to the bathroom occasionally  Had a red spot on the back of her leg - possible bite - not itching any more  - used a cortisone cream on this  Takes allergy medication  Review of Systems  Constitutional: Positive for diaphoresis. Negative for chills and fever.  HENT: Positive for tinnitus.   Eyes: Positive for redness. Negative for itching.       Watery eyes  Respiratory: Negative for chest tightness and shortness of breath.   Cardiovascular: Positive for palpitations (fast heart rate). Negative for chest pain.  Gastrointestinal: Negative for abdominal pain, diarrhea, nausea and vomiting.  Genitourinary: Positive for urgency. Negative for dysuria and pelvic pain.  Musculoskeletal: Positive for arthralgias (chronic). Negative for myalgias.  Neurological: Positive for headaches.     Social History   Tobacco Use  Smoking Status Never Smoker  Smokeless Tobacco Never Used        Objective:    BP Readings from Last 3 Encounters:  11/06/19 128/76  09/30/19 (!) 147/72  09/18/19 124/74   Wt Readings from Last 3 Encounters:  11/06/19 158 lb 8 oz (71.9 kg)  09/30/19 156 lb (70.8 kg)  09/18/19 155 lb (70.3 kg)    BP 128/76   Pulse 82   Temp 97.6 F (36.4 C) (Temporal)   Ht 5' 1"  (1.549 m)   Wt 158 lb 8 oz (71.9 kg)   LMP 04/30/2012   SpO2 98%   BMI 29.95 kg/m    Physical Exam Constitutional:      General: She is not in acute distress.    Appearance: She is well-developed. She is not diaphoretic.  HENT:     Head: Normocephalic and atraumatic.     Right Ear: Tympanic membrane and external ear normal.     Left Ear: Tympanic membrane and external ear  normal.     Nose: Nose normal. No rhinorrhea.     Mouth/Throat:     Mouth: Mucous membranes are moist.     Pharynx: No oropharyngeal exudate or posterior oropharyngeal erythema.  Eyes:     Conjunctiva/sclera: Conjunctivae normal.  Cardiovascular:     Rate and Rhythm: Normal rate and regular rhythm.     Heart sounds: No murmur heard.   Pulmonary:     Effort: Pulmonary effort is normal. No respiratory distress.     Breath sounds: Normal breath sounds. No wheezing.  Musculoskeletal:     Cervical back: Neck supple.  Skin:    General: Skin is warm and dry.     Capillary Refill: Capillary refill takes less than 2 seconds.     Comments: Raised erythematous lesion on posterior thigh. No bullseye lesions  Neurological:     Mental Status: She is alert. Mental status is at baseline.  Psychiatric:        Mood and Affect: Mood normal.        Behavior: Behavior normal.           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 No abnormal bleeding. Will get labs      Relevant Medications   vitamin B-12 (CYANOCOBALAMIN) 500 MCG tablet   Other Relevant Orders   CBC with Differential     Other   Other fatigue    New onset over the last week. No abnormal bleeding. Thyroid in June was normal. Negative covid test last week. Will get labs. Wonder about allergies with some HA, eye symptoms - encouraged switching allergy medication.       Relevant Orders   Comprehensive metabolic panel    Other Visit Diagnoses    Insect bite of right thigh, initial encounter         Insect bite w/o signs of lyme disease or hx of tick. If work-up normal could consider empiric treatment as pt unaware if tick or other insect.   Return if symptoms worsen or fail to improve.  Lesleigh Noe,  MD  This visit occurred during the SARS-CoV-2 public health emergency.  Safety protocols were in place, including screening questions prior to the visit, additional usage of staff PPE, and extensive cleaning of exam room while observing appropriate contact time as indicated for disinfecting solutions.

## 2019-11-06 NOTE — Assessment & Plan Note (Signed)
New onset over the last week. No abnormal bleeding. Thyroid in June was normal. Negative covid test last week. Will get labs. Wonder about allergies with some HA, eye symptoms - encouraged switching allergy medication.

## 2019-11-07 ENCOUNTER — Other Ambulatory Visit (INDEPENDENT_AMBULATORY_CARE_PROVIDER_SITE_OTHER): Payer: Federal, State, Local not specified - PPO

## 2019-11-07 ENCOUNTER — Telehealth (INDEPENDENT_AMBULATORY_CARE_PROVIDER_SITE_OTHER): Payer: Federal, State, Local not specified - PPO | Admitting: Family Medicine

## 2019-11-07 DIAGNOSIS — R350 Frequency of micturition: Secondary | ICD-10-CM | POA: Diagnosis not present

## 2019-11-07 DIAGNOSIS — R5383 Other fatigue: Secondary | ICD-10-CM

## 2019-11-07 DIAGNOSIS — R32 Unspecified urinary incontinence: Secondary | ICD-10-CM

## 2019-11-07 LAB — POCT URINALYSIS DIPSTICK
Bilirubin, UA: NEGATIVE
Blood, UA: NEGATIVE
Glucose, UA: NEGATIVE
Ketones, UA: NEGATIVE
Leukocytes, UA: NEGATIVE
Nitrite, UA: NEGATIVE
Protein, UA: NEGATIVE
Spec Grav, UA: 1.02 (ref 1.010–1.025)
Urobilinogen, UA: 0.2 E.U./dL
pH, UA: 6 (ref 5.0–8.0)

## 2019-11-07 LAB — MAGNESIUM: Magnesium: 1.7 mg/dL (ref 1.5–2.5)

## 2019-11-07 NOTE — Telephone Encounter (Signed)
Patient returned call. I let her know Vicki [name] was taking a patient back but that I would send a message stating she had called.

## 2019-11-07 NOTE — Addendum Note (Signed)
Addended by: Lesleigh Noe on: 11/07/2019 04:46 PM   Modules accepted: Orders

## 2019-11-07 NOTE — Telephone Encounter (Signed)
Spoke to pt and relayed lab results, pr Dr. Einar Pheasant. Pt says she will come by this afternoon to give a urine sample to check for infection.

## 2019-11-07 NOTE — Telephone Encounter (Signed)
Attempted to call patient to relay lab results but go voicemail.   Please call patient and relay the following:   1) Blood counts are good - everything is better than her blood work 1 month ago.   Liver, kidney and electrolytes are stable.    I realized after she left that I was going to offer that we check her urine for infection. It seems less likely but if she wants to return to today to do a urine sample I can order one. Also, if she is still experiencing difficulty emptying her bladder and the urine is without infection I would recommend urology referral. If she would be OK with this please check where she would like to be seen.

## 2019-11-07 NOTE — Telephone Encounter (Signed)
Spoke to pt and let her know that there was no sign of infection in her UA and that Dr. Einar Pheasant sent in a referral for urology.

## 2019-11-07 NOTE — Telephone Encounter (Signed)
UA w/o signs of infection  Pt with urinary incontinence and issues emptying her bladder  Will refer to urology

## 2019-11-07 NOTE — Telephone Encounter (Signed)
Called and spoke with pt, notified of Dr.Berry's recommendations. Pt states she was able to see her PCP yesterday but she is waiting to hear back from them. Notified we could still schedule an appt and if she was told by her pcp that she did not need it then we could cancel it. Pt verbalized understanding and is scheduled to see Coletta Memos on 11/14/19 at 10:45am. Advised that if pt had any chest pain to seek evaluation in the ED and with any other issues to contact our office again. Pt verbalized understanding with no other questions at this time.

## 2019-11-13 NOTE — Progress Notes (Signed)
Cardiology Clinic Note   Patient Name: Vicki [name] Date of Encounter: 11/14/2019  Primary Care Provider:  Lesleigh Noe, MD Primary Cardiologist:  Quay Burow, MD  Patient Profile    Vicki D. [age] year old female presents the clinic today for follow-up evaluation of her palpitations.  Past Medical History    Past Medical History:  Diagnosis Date  . Allergy   . Arthritis   . Boil   . Cancer ([place])    throbotic thrombocytopenic purpura  . 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 Diabetes ([place])    type 2  . Elevated liver enzymes   . Fatty liver   . Hidradenitis   . Hyperlipemia   . Hypertension   . 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 PVC's (premature ventricular contractions)   . 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 BREAST CYST ASPIRATION Left   . COLONOSCOPY  2014  . ECTOPIC PREGNANCY SURGERY  1990  . IR FLUORO GUIDE CV LINE RIGHT  03/05/2019  . IR FLUORO GUIDE CV LINE RIGHT  03/15/2019  . IR FLUORO GUIDE CV LINE RIGHT  05/17/2019  . IR FLUORO GUIDE CV LINE RIGHT  05/23/2019  . IR REMOVAL TUN CV CATH W/O FL  04/12/2019  . IR REMOVAL TUN CV CATH W/O FL  08/06/2019  . IR US GUIDE VASC ACCESS RIGHT  03/05/2019  . IR US GUIDE VASC ACCESS RIGHT  03/15/2019  . IR US GUIDE VASC ACCESS RIGHT  05/17/2019  . TONSILLECTOMY  1980  . UPPER GI ENDOSCOPY  08/2017   [facility]    Allergies  Allergies  Allergen Reactions  . Metformin And Related Itching    Heart palpitation  . Sulfa Antibiotics Itching  . Sulfasalazine Itching    History of Present Illness    Vicki [name] has a PMH of palpitations, hypertension, diabetes, menopause, sinus tachycardia, PVCs, thrombotic thrombocytopenia purpura, liver cirrhosis secondary to NASH, and cardiac murmur.  She was initially seen and evaluated by Dr. Gwenlyn Found 10/18 for evaluation of her palpitations.   She was drinking 2 cups of caffeinated drinks per day as well as EtOH on weekends.  She also felt she was under increased stress.  Her echocardiogram showed normal LVEF and a cardiac event monitor showed sinus rhythm, sinus tachycardia, and frequent PVCs.  She subsequently decreased her caffeine intake and her symptoms improved.  She was last seen by Dr. Gwenlyn Found on 05/08/2019.  During that time she had been diagnosed with TTP and undergone plasmapheresis.  She continued to drink less caffeine and EtOH.  She continued to notice less frequent palpitations.  She did notice some atypical chest pain when her indwelling catheter had been removed.  These were not sustained.  She presents to the clinic today for follow-up evaluation and states she has noticed intermittent dull/aching chest pain in her right upper chest for the past 2-3 weeks.  She denies trauma, new exercise routines, motor vehicle accidents, and falls.  She states that the pain usually lasts for minutes to seconds and then dissipates.  It comes on at rest and not with physical activity.  She states that she has not been as physically active but plans to start walking.  She indicates that she has some dietary indiscretion and does not avoid salt.  Her pain is musculoskeletal in nature.  I will have her rest the area, use cool compresses  and OTC pain medication as directed.  We will give her the salty 6 diet sheet, have her walk 15 minutes daily and follow-up in 6 months.  Today she denies chest pain, shortness of breath, lower extremity edema, fatigue, increased palpitations, melena, hematuria, hemoptysis, diaphoresis, weakness, presyncope, syncope, orthopnea, and PND.   Home Medications    Prior to Admission medications   Medication Sig Start Date End Date Taking? Authorizing Provider  Alum Hydroxide-Mag Carbonate (GAVISCON EXTRA STRENGTH PO) Take 1 tablet by mouth 4 (four) times daily as needed. 03/26/19   [provider]   cholecalciferol (VITAMIN D3) 25 MCG (1000 UT) tablet Take 1,000 Units by mouth daily.    [provider]  dextromethorphan-guaiFENesin (MUCINEX DM) 30-600 MG 12hr tablet Take 2 tablets by mouth 2 (two) times daily as needed for cough.    [provider]  Dulaglutide (TRULICITY) 1.61 WR/6.0AV SOPN Inject 0.5 mLs (0.75 mg total) into the skin once a week. 10/28/19   Lesleigh Noe, MD  folic acid (FOLVITE) 1 MG tablet Take 2 tablets (2 mg total) by mouth daily. 04/09/19   Volanda Napoleon, MD  furosemide (LASIX) 40 MG tablet Take 1 tablet by mouth once daily 10/29/19   Volanda Napoleon, MD  glucose blood test strip One touch ultra 11/11/15   [provider]  MAGNESIUM OXIDE PO Take by mouth.    [provider]  mometasone (ELOCON) 0.1 % cream Apply 1 application topically daily. 09/30/19   Volanda Napoleon, MD  Multiple Vitamins-Minerals (MULTI FOR HER 50+) TABS Take 1 tablet by mouth daily.    [provider]  potassium chloride (KLOR-CON) 10 MEQ tablet Take 1 tablet by mouth once daily 10/29/19   Volanda Napoleon, MD  senna-docusate (SENOKOT-S) 8.6-50 MG tablet Take 2 tablets by mouth 2 (two) times daily. 03/15/19   Swayze, Ava, DO  sitaGLIPtin (JANUVIA) 100 MG tablet Take 1 tablet (100 mg total) by mouth daily. 08/15/19   Lesleigh Noe, MD  traMADol (ULTRAM) 50 MG tablet Take by mouth.    [provider]  vitamin B-12 (CYANOCOBALAMIN) 500 MCG tablet Take 5,000 mcg by mouth daily.    [provider]    Family History    Family History  Problem Relation Age of Onset  . Hypertension Mother   . Diabetes Mother   . Heart attack Mother 38  . Asthma Mother        Childhood  . Arthritis Mother   . Heart disease Mother   . Hyperlipidemia Mother   . Hypertension Sister   . Diabetes Sister   . Asthma Sister 40       asthma attack cause of death  . Diabetes Brother   . Hypertension Brother   . Diabetes Brother   . Hypertension Brother    . Hypertension Maternal Grandmother   . Heart attack Maternal Grandfather 60  . Colon cancer Neg Hx    She indicated that her mother is alive. She indicated that her father is deceased. She indicated that two of her three sisters are alive. She indicated that both of her brothers are alive. She indicated that her maternal grandmother is deceased. She indicated that her maternal grandfather is deceased. She indicated that her paternal grandmother is deceased. She indicated that her paternal grandfather is deceased. She indicated that her daughter is alive. She indicated that the status of her neg hx is unknown.  Social History    Social History  Socioeconomic History  . Marital status: Single    Spouse name: Not on file  . Number of children: 1  . Years of education: Not on file  . Highest education level: Some college, no degree  Occupational History    Employer: [employer]  Tobacco Use  . Smoking status: Never Smoker  . Smokeless tobacco: Never Used  Vaping Use  . Vaping Use: Never used  Substance and Sexual Activity  . Alcohol use: Yes    Alcohol/week: 3.0 standard drinks    Types: 3 Standard drinks or equivalent per week    Comment: trying to reduce  . Drug use: No  . Sexual activity: Not Currently    Birth control/protection: Condom  Other Topics Concern  . 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 Difficulty of Paying Living Expenses: Not on file  Food Insecurity:   . Worried About Charity fundraiser in the Last Year: Not on file   . Ran Out of Food in the Last Year: Not on file  Transportation Needs:   . Lack of Transportation (Medical): Not on file  . Lack of Transportation (Non-Medical): Not on file  Physical Activity:   . Days of Exercise per Week: Not on file  . Minutes of Exercise per Session: Not on file  Stress:   . Feeling of Stress : Not on file  Social Connections:   . Frequency of Communication with Friends and Family: Not on file  . Frequency of Social Gatherings with Friends and Family: Not on file  . Attends Religious Services: Not on file  . Active Member of Clubs or Organizations: Not on file  . Attends Archivist Meetings: Not on file  . Marital Status: Not on file  Intimate Partner Violence:   . Fear of Current or Ex-Partner: Not on file  . Emotionally Abused: Not on file  . Physically Abused: Not on file  . Sexually Abused: Not on file     Review of Systems    General:  No chills, fever, night sweats or weight changes.  Cardiovascular:  No chest pain, dyspnea on exertion, edema, orthopnea, palpitations, paroxysmal nocturnal dyspnea. Dermatological: No rash, lesions/masses Respiratory: No cough, dyspnea Urologic: No hematuria, dysuria Abdominal:   No nausea, vomiting, diarrhea, bright red blood per rectum, melena, or hematemesis Neurologic:  No visual changes, wkns, changes in mental status. All other systems reviewed and are otherwise negative except as noted above.  Physical Exam    VS:  BP (!) 142/78   Pulse 72   Ht 5' 1"  (1.549 m)   Wt 158 lb 3.2 oz (71.8 kg)   LMP 04/30/2012   SpO2 99%   BMI 29.89 kg/m  , BMI Body mass index is 29.89 kg/m. GEN: Well nourished, well developed, in no acute distress. HEENT: normal. Neck: Supple, no  JVD, carotid bruits, or masses. Cardiac: RRR, no murmurs, rubs, or gallops. No clubbing, cyanosis, edema.  Radials/DP/PT 2+ and equal bilaterally.  Respiratory:  Respirations regular and unlabored, clear to auscultation bilaterally.  GI: Soft, nontender, nondistended, BS + x 4. MS: no deformity or atrophy.  Chest wall pain reproduced during deep palpation  Skin: warm and dry, no rash. Neuro:  Strength and sensation are intact. Psych: Normal affect.  Accessory Clinical Findings    Recent Labs: 08/29/2019: TSH 0.72 11/06/2019: ALT 29; BUN 9; Creatinine, Ser 0.81; Hemoglobin 12.2; Platelets 124.0; Potassium 3.8; Sodium 138 11/07/2019: Magnesium 1.7   Recent Lipid Panel    Component Value Date/Time   CHOL 166 09/26/2018 0000   TRIG 109 03/03/2019 2136   HDL 39 09/26/2018 0000   LDLCALC 115 09/26/2018 0000    ECG personally reviewed by me today-normal sinus rhythm anterior infarct undetermined age 55 bpm- No acute changes  Echocardiogram 06/10/2019 IMPRESSIONS    1. Left ventricular ejection fraction, by estimation, is 60 to 65%. The  left ventricle has normal function. The left ventricle has no regional  wall motion abnormalities. Left ventricular diastolic parameters were  normal.  2. Right ventricular systolic function is normal. The right ventricular  size is normal.  3. Left atrial size was mildly dilated.  4. The mitral valve is normal in structure. No evidence of mitral valve  regurgitation. No evidence of mitral stenosis.  5. The aortic valve is normal in structure. Aortic valve regurgitation is  not visualized. No aortic stenosis is present.  Cardiac event monitor 01/30/2017 Sinus rhythm, sinus tach, frequent PVCs  Assessment & Plan   1.  Chest wall pain-reproducible during exam with deep palpation.  Noticed pain has been dull aching/burning sensation intermittently for the past 3 weeks.  Denies trauma, MVA, falls, and new exercise routines. May use OTC pain medication-Tylenol as directed Cool compresses Rest area Reassured that her pain was not related to cardiac issues.   Palpitations-EKG today shows normal sinus rhythm anterior infarct undetermined age 60 bpm.  Cardiac event monitor  previously showed sinus rhythm, sinus tach, frequent PVCs.  She reduced her caffeine intake and alcohol intake and her palpitations decreased.  Echocardiogram showed normal LVEF and no  valvular abnormalities. Continue to avoid caffeine, chocolate, EtOH, and other triggers for palpitations. Increase p.o. hydration Heart healthy low-sodium diet-salty 6 given Increase physical activity as tolerated  Cardiac murmur-continues with 2/6 systolic murmur.  Follow-up echocardiogram 06/10/2019 showed no valvular abnormalities and normal LVEF. Continue to monitor  Disposition: Follow-up with Dr. Gwenlyn Found in 6 months.  Jossie Ng. Cleaver NP-C    11/14/2019, 11:35 AM [address] Office [phone] Fax [phone]  Notice: This dictation was prepared with Dragon dictation along with smaller phrase technology. Any transcriptional errors that result from this process are unintentional and may not be corrected upon review.

## 2019-11-14 ENCOUNTER — Other Ambulatory Visit: Payer: Self-pay

## 2019-11-14 ENCOUNTER — Encounter: Payer: Self-pay | Admitting: General Practice

## 2019-11-14 ENCOUNTER — Ambulatory Visit: Payer: Federal, State, Local not specified - PPO | Admitting: General Practice

## 2019-11-14 VITALS — BP 142/78 | HR 72 | Ht 61.0 in | Wt 158.2 lb

## 2019-11-14 DIAGNOSIS — R0789 Other chest pain: Secondary | ICD-10-CM | POA: Diagnosis not present

## 2019-11-14 DIAGNOSIS — R011 Cardiac murmur, unspecified: Secondary | ICD-10-CM | POA: Diagnosis not present

## 2019-11-14 DIAGNOSIS — R002 Palpitations: Secondary | ICD-10-CM

## 2019-11-14 NOTE — Patient Instructions (Addendum)
Medication Instructions:  Continue current medications  *If you need a refill on your cardiac medications before your next appointment, please call your pharmacy*   Lab Work: None Ordered   Testing/Procedures: None Ordered   Follow-Up: At [facility], you and your health needs are our priority.  As part of our continuing mission to provide you with exceptional heart care, we have created designated Provider Care Teams.  These Care Teams include your primary Cardiologist (physician) and Advanced Practice Providers (APPs -  Physician Assistants and Nurse Practitioners) who all work together to provide you with the care you need, when you need it.  We recommend signing up for the patient portal called "MyChart".  Sign up information is provided on this After Visit Summary.  MyChart is used to connect with patients for Virtual Visits (Telemedicine).  Patients are able to view lab/test results, encounter notes, upcoming appointments, etc.  Non-urgent messages can be sent to your provider as well.   To learn more about what you can do with MyChart, go to [email or web address].    Your next appointment:   6 month(s)  The format for your next appointment:   In Person  Provider:   You may see Quay Burow, MD or one of the following Advanced Practice Providers on your designated Care Team:    Kerin Ransom, PA-C  [place], [place]  Coletta Memos, [place]    Other Instructions  Continue to avoid caffeine, chocolate, alcohol, and other triggers for palpitations.

## 2019-11-18 ENCOUNTER — Other Ambulatory Visit: Payer: Self-pay | Admitting: Family Medicine

## 2019-11-18 ENCOUNTER — Other Ambulatory Visit: Payer: Self-pay | Admitting: Hematology & Oncology

## 2019-11-18 DIAGNOSIS — E119 Type 2 diabetes mellitus without complications: Secondary | ICD-10-CM

## 2019-11-26 ENCOUNTER — Other Ambulatory Visit: Payer: Self-pay

## 2019-11-26 ENCOUNTER — Ambulatory Visit
Admission: EM | Admit: 2019-11-26 | Discharge: 2019-11-26 | Disposition: A | Discharge status: Home / Self Care | Payer: Federal, State, Local not specified - PPO

## 2019-11-26 ENCOUNTER — Telehealth: Payer: Self-pay | Admitting: Hematology & Oncology

## 2019-11-26 NOTE — Telephone Encounter (Signed)
Faxed medical records to: [id number]   for   [facility] [place] January 01, 1961 CASE: [id number]

## 2019-11-27 ENCOUNTER — Other Ambulatory Visit: Payer: Federal, State, Local not specified - PPO

## 2019-11-27 DIAGNOSIS — Z20822 Contact with and (suspected) exposure to covid-19: Secondary | ICD-10-CM

## 2019-11-29 LAB — SARS-COV-2, NAA 2 DAY TAT

## 2019-11-29 LAB — NOVEL CORONAVIRUS, NAA: SARS-CoV-2, NAA: NOT DETECTED

## 2019-12-04 ENCOUNTER — Inpatient Hospital Stay: Payer: Federal, State, Local not specified - PPO | Admitting: Hematology & Oncology

## 2019-12-04 ENCOUNTER — Inpatient Hospital Stay: Payer: Federal, State, Local not specified - PPO

## 2019-12-13 ENCOUNTER — Ambulatory Visit: Payer: Federal, State, Local not specified - PPO | Admitting: Podiatry

## 2019-12-13 ENCOUNTER — Other Ambulatory Visit: Payer: Self-pay | Admitting: Hematology & Oncology

## 2019-12-18 DIAGNOSIS — K7460 Unspecified cirrhosis of liver: Secondary | ICD-10-CM | POA: Diagnosis not present

## 2019-12-18 DIAGNOSIS — D61818 Other pancytopenia: Secondary | ICD-10-CM | POA: Diagnosis not present

## 2019-12-18 DIAGNOSIS — R5383 Other fatigue: Secondary | ICD-10-CM | POA: Diagnosis not present

## 2019-12-18 DIAGNOSIS — R0683 Snoring: Secondary | ICD-10-CM | POA: Diagnosis not present

## 2019-12-18 DIAGNOSIS — K7581 Nonalcoholic steatohepatitis (NASH): Secondary | ICD-10-CM | POA: Diagnosis not present

## 2020-01-01 ENCOUNTER — Encounter: Payer: Self-pay | Admitting: Hematology & Oncology

## 2020-01-01 ENCOUNTER — Inpatient Hospital Stay (HOSPITAL_BASED_OUTPATIENT_CLINIC_OR_DEPARTMENT_OTHER): Payer: Federal, State, Local not specified - PPO | Admitting: Hematology & Oncology

## 2020-01-01 ENCOUNTER — Inpatient Hospital Stay: Payer: Federal, State, Local not specified - PPO | Attending: Hematology

## 2020-01-01 ENCOUNTER — Other Ambulatory Visit: Payer: Self-pay

## 2020-01-01 VITALS — BP 138/77 | HR 90 | Temp 99.0°F | Resp 20 | Wt 164.8 lb

## 2020-01-01 DIAGNOSIS — M3119 Other thrombotic microangiopathy: Secondary | ICD-10-CM

## 2020-01-01 DIAGNOSIS — K7581 Nonalcoholic steatohepatitis (NASH): Secondary | ICD-10-CM | POA: Insufficient documentation

## 2020-01-01 LAB — CMP (CANCER CENTER ONLY)
ALT: 42 U/L (ref 0–44)
AST: 64 U/L — ABNORMAL HIGH (ref 15–41)
Albumin: 4.1 g/dL (ref 3.5–5.0)
Alkaline Phosphatase: 85 U/L (ref 38–126)
Anion gap: 6 (ref 5–15)
BUN: 8 mg/dL (ref 6–20)
CO2: 30 mmol/L (ref 22–32)
Calcium: 10.7 mg/dL — ABNORMAL HIGH (ref 8.9–10.3)
Chloride: 101 mmol/L (ref 98–111)
Creatinine: 0.83 mg/dL (ref 0.44–1.00)
GFR, Estimated: >60 mL/min (ref >60)
Glucose, Bld: 134 mg/dL — ABNORMAL HIGH (ref 70–99)
Potassium: 3.9 mmol/L (ref 3.5–5.1)
Sodium: 137 mmol/L (ref 135–145)
Total Bilirubin: 0.9 mg/dL (ref 0.3–1.2)
Total Protein: 8.4 g/dL — ABNORMAL HIGH (ref 6.5–8.1)

## 2020-01-01 LAB — CBC WITH DIFFERENTIAL (CANCER CENTER ONLY)
Abs Immature Granulocytes: 0.04 10*3/uL (ref 0.00–0.07)
Basophils Absolute: 0 10*3/uL (ref 0.0–0.1)
Basophils Relative: 1 %
Eosinophils Absolute: 0.4 10*3/uL (ref 0.0–0.5)
Eosinophils Relative: 12 %
HCT: 36 % (ref 36.0–46.0)
Hemoglobin: 12 g/dL (ref 12.0–15.0)
Immature Granulocytes: 1 %
Lymphocytes Relative: 30 %
Lymphs Abs: 1.1 10*3/uL (ref 0.7–4.0)
MCH: 29.9 pg (ref 26.0–34.0)
MCHC: 33.3 g/dL (ref 30.0–36.0)
MCV: 89.8 fL (ref 80.0–100.0)
Monocytes Absolute: 0.3 10*3/uL (ref 0.1–1.0)
Monocytes Relative: 8 %
Neutro Abs: 1.7 10*3/uL (ref 1.7–7.7)
Neutrophils Relative %: 48 %
Platelet Count: 113 10*3/uL — ABNORMAL LOW (ref 150–400)
RBC: 4.01 MIL/uL (ref 3.87–5.11)
RDW: 13.4 % (ref 11.5–15.5)
WBC Count: 3.6 10*3/uL — ABNORMAL LOW (ref 4.0–10.5)
nRBC: 0 % (ref 0.0–0.2)

## 2020-01-01 LAB — RETICULOCYTES
Immature Retic Fract: 10.8 % (ref 2.3–15.9)
RBC.: 4.06 MIL/uL (ref 3.87–5.11)
Retic Count, Absolute: 56 10*3/uL (ref 19.0–186.0)
Retic Ct Pct: 1.4 % (ref 0.4–3.1)

## 2020-01-01 LAB — SAVE SMEAR(SSMR), FOR PROVIDER SLIDE REVIEW

## 2020-01-01 LAB — LACTATE DEHYDROGENASE: LDH: 142 U/L (ref 98–192)

## 2020-01-01 NOTE — Progress Notes (Signed)
Hematology and Oncology Follow Up Visit  Vicki [name] [id number] 1960/05/26 59 y.o. 01/01/2020   Principle Diagnosis:   TTP - acquired -- relapsed  NASH -- leukopenia/thrombocytopenia  Current Therapy:    Plasma Exchange - M-Th -- d/c on 08/01/2019  Rituxan 375 mg/m2 IV weekly -- s/p cycle #4 - completed on 06/04/2019     Interim History:  Vicki [name] is back for her follow-up.  She continues to do quite well.  She has no problems with respect to TTP relapse.  We last saw her back in July, her ADAMTS-13 level was 79%.  She has been followed for her NASH.  She is going have a CT scan done in the next few weeks.  She has had no fever.  She did have the COVID back in September.  She was not hospitalized.  She quarantine herself for 2 weeks.  There is been no bleeding.  She has had some bruises.  She has had no nausea or vomiting.  There is been no change in bowel or bladder habits.  Currently, her performance status is ECOG 1.     Medications:  Current Outpatient Medications:  .  acetaminophen (TYLENOL) 325 MG tablet, Take 650 mg by mouth every 6 (six) hours as needed., Disp: , Rfl:  .  Alum Hydroxide-Mag Carbonate (GAVISCON EXTRA STRENGTH PO), Take 1 tablet by mouth 4 (four) times daily as needed., Disp: , Rfl:  .  cholecalciferol (VITAMIN D3) 25 MCG (1000 UT) tablet, Take 1,000 Units by mouth daily., Disp: , Rfl:  .  dextromethorphan-guaiFENesin (MUCINEX DM) 30-600 MG 12hr tablet, Take 2 tablets by mouth 2 (two) times daily as needed for cough., Disp: , Rfl:  .  Dulaglutide (TRULICITY) 0.86 VH/8.4ON SOPN, Inject 0.5 mLs (0.75 mg total) into the skin once a week., Disp: 2 mL, Rfl: 2 .  folic acid (FOLVITE) 1 MG tablet, Take 2 tablets (2 mg total) by mouth daily., Disp: 60 tablet, Rfl: 12 .  furosemide (LASIX) 40 MG tablet, Take 1 tablet by mouth once daily, Disp: 30 tablet, Rfl: 0 .  glucose blood test strip, One touch ultra, Disp: , Rfl:  .  Homeopathic Products  (LIVER SUPPORT SL), Take by mouth in the morning and at bedtime., Disp: , Rfl:  .  loratadine (CLARITIN) 10 MG tablet, Take by mouth daily. , Disp: , Rfl:  .  MAGNESIUM OXIDE PO, Take by mouth every other day. , Disp: , Rfl:  .  mometasone (ELOCON) 0.1 % cream, Apply 1 application topically daily., Disp: 45 g, Rfl: 0 .  Multiple Vitamins-Minerals (MULTI FOR HER 50+) TABS, Take 1 tablet by mouth daily., Disp: , Rfl:  .  potassium chloride (KLOR-CON) 10 MEQ tablet, Take 1 tablet by mouth once daily, Disp: 30 tablet, Rfl: 0 .  senna-docusate (SENOKOT-S) 8.6-50 MG tablet, Take 2 tablets by mouth 2 (two) times daily., Disp: 20 tablet, Rfl: 0 .  sitaGLIPtin (JANUVIA) 100 MG tablet, Take 1 tablet (100 mg total) by mouth daily., Disp: 90 tablet, Rfl: 3 .  traMADol (ULTRAM) 50 MG tablet, Take by mouth every 6 (six) hours as needed. , Disp: , Rfl:  .  vitamin B-12 (CYANOCOBALAMIN) 500 MCG tablet, Take 5,000 mcg by mouth daily., Disp: , Rfl:   Allergies:  Allergies  Allergen Reactions  . Metformin And Related Itching    Heart palpitation  . Sulfa Antibiotics Itching  . Sulfasalazine Itching    Past Medical History, Surgical history, Social history, and Family History  were reviewed and updated.  Review of Systems: Review of Systems  Constitutional: Negative.   HENT:  Negative.   Eyes: Negative.   Respiratory: Negative.   Cardiovascular: Positive for leg swelling.  Gastrointestinal: Negative.   Endocrine: Negative.   Genitourinary: Negative.    Musculoskeletal: Positive for arthralgias and myalgias.  Skin: Negative.   Neurological: Negative.   Hematological: Negative.   Psychiatric/Behavioral: Negative.     Physical Exam:  weight is 164 lb 12.8 oz (74.8 kg). Her oral temperature is 99 F (37.2 C). Her blood pressure is 138/77 and her pulse is 90. Her respiration is 20 and oxygen saturation is 100%.   Wt Readings from Last 3 Encounters:  01/01/20 164 lb 12.8 oz (74.8 kg)  11/14/19 158  lb 3.2 oz (71.8 kg)  11/06/19 158 lb 8 oz (71.9 kg)    Physical Exam Vitals reviewed.  HENT:     Head: Normocephalic and atraumatic.  Eyes:     Pupils: Pupils are equal, round, and reactive to light.  Cardiovascular:     Rate and Rhythm: Normal rate and regular rhythm.     Heart sounds: Normal heart sounds.  Pulmonary:     Effort: Pulmonary effort is normal.     Breath sounds: Normal breath sounds.  Abdominal:     General: Bowel sounds are normal.     Palpations: Abdomen is soft.  Musculoskeletal:        General: No tenderness or deformity. Normal range of motion.     Cervical back: Normal range of motion.     Comments: Extremities shows some 1+ edema in her lower legs.  She has good range of motion of her joints.  She has good strength in upper and lower extremities.  Lymphadenopathy:     Cervical: No cervical adenopathy.  Skin:    General: Skin is warm and dry.     Findings: No erythema or rash.  Neurological:     Mental Status: She is alert and oriented to person, place, and time.  Psychiatric:        Behavior: Behavior normal.        Thought Content: Thought content normal.        Judgment: Judgment normal.      Lab Results  Component Value Date   WBC 3.6 (L) 01/01/2020   HGB 12.0 01/01/2020   HCT 36.0 01/01/2020   MCV 89.8 01/01/2020   PLT 113 (L) 01/01/2020     Chemistry      Component Value Date/Time   NA 137 01/01/2020 1347   K 3.9 01/01/2020 1347   CL 101 01/01/2020 1347   CO2 30 01/01/2020 1347   BUN 8 01/01/2020 1347   CREATININE 0.83 01/01/2020 1347   CREATININE 0.70 09/25/2015 1504      Component Value Date/Time   CALCIUM 10.7 (H) 01/01/2020 1347   ALKPHOS 85 01/01/2020 1347   AST 64 (H) 01/01/2020 1347   ALT 42 01/01/2020 1347   BILITOT 0.9 01/01/2020 1347       Impression and Plan: Vicki [name] is a 59 year old African-American female.  She has acquired TTP.  She had relapsed.  She responded well to treatment with plasma exchange and  Rituxan.  She is now has been off treatment now for 6 months.  I noted that there is a slight trend downward with respect to her ADAMTS-13 value.  We will have to see what today's level is.  I looked at her blood smear.  I really  do not see anything on the blood smear that would suggest activity of the TTP.  Her platelet count looks fine.  We will plan to get her back next year.  We can get her through the holidays.  I know that she certainly is at risk for relapse.  Hopefully, and we will continue to have her in remission.     Volanda Napoleon, MD 10/27/20212:33 PM

## 2020-01-02 ENCOUNTER — Encounter: Payer: Self-pay | Admitting: Sports Medicine

## 2020-01-02 ENCOUNTER — Ambulatory Visit: Payer: Federal, State, Local not specified - PPO | Admitting: Sports Medicine

## 2020-01-02 DIAGNOSIS — L853 Xerosis cutis: Secondary | ICD-10-CM

## 2020-01-02 DIAGNOSIS — L819 Disorder of pigmentation, unspecified: Secondary | ICD-10-CM

## 2020-01-02 DIAGNOSIS — B359 Dermatophytosis, unspecified: Secondary | ICD-10-CM | POA: Diagnosis not present

## 2020-01-02 MED ORDER — CLOTRIMAZOLE 1 % EX SOLN: 1.0000 "application " | Freq: Every day | CUTANEOUS | 5 refills | Status: DC

## 2020-01-02 MED ORDER — NYSTATIN-TRIAMCINOLONE 100000-0.1 UNIT/GM-% EX OINT: 1.0000 "application " | TOPICAL_OINTMENT | Freq: Two times a day (BID) | CUTANEOUS | 0 refills | Status: DC

## 2020-01-02 NOTE — Progress Notes (Signed)
Subjective: Vicki [name] is a 59 y.o. female patient who presents to office for evaluation of dry scaling skin and dark sports that are very itchy on the bottom of left foot. Patient reports that she noticed these areas over last month, worse in arch and has been putting bandaid on. No other pedal compliants.   Patient Active Problem List   Diagnosis Date Noted  . Other fatigue 11/06/2019  . Hair loss 08/29/2019  . Normocytic anemia 08/15/2019  . Cellulitis 05/16/2019  . Cardiac murmur 05/08/2019  . Blood glucose elevated 04/23/2019  . Boil of groin 04/23/2019  . 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 Insomnia 02/26/2019  . Acquired pancytopenia ([facility]) 11/01/2018  . Liver cirrhosis secondary to NASH ([place]) 04/25/2018  . 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 Spinal stenosis of lumbar region 01/17/2018  . Palpitations 04/15/2015  . Controlled type 2 diabetes mellitus without complication ([place]) 56/21/3086  . Elevated liver enzymes 03/31/2014  . Tinea pedis 07/31/2013  . Foot and toe(s), blister, infected 07/31/2013  . 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 Pain in joint, ankle and foot 08/31/2012    Current Outpatient Medications on File Prior to Visit  Medication Sig Dispense Refill  . acetaminophen (TYLENOL) 325 MG tablet Take 650 mg by mouth every 6 (six) hours as needed.    . Alum Hydroxide-Mag Carbonate (GAVISCON EXTRA STRENGTH PO) Take 1 tablet by mouth 4 (four) times daily as needed.    . cholecalciferol (VITAMIN D3) 25 MCG (1000 UT) tablet Take 1,000 Units by mouth daily.    [name] dextromethorphan-guaiFENesin (MUCINEX DM) 30-600 MG 12hr tablet Take 2 tablets by mouth 2 (two) times daily as needed for cough.    . Dulaglutide (TRULICITY) 5.78 IO/9.6EX SOPN Inject 0.5 mLs (0.75 mg total) into the skin once a week. 2 mL 2  . folic acid (FOLVITE) 1 MG tablet Take 2 tablets (2 mg total) by mouth daily. 60 tablet 12  . furosemide (LASIX) 40  MG tablet Take 1 tablet by mouth once daily 30 tablet 0  . glucose blood test strip One touch ultra    . Homeopathic Products (LIVER SUPPORT SL) Take by mouth in the morning and at bedtime.    [name] loratadine (CLARITIN) 10 MG tablet Take by mouth daily.     [name] MAGNESIUM OXIDE PO Take by mouth every other day.     . mometasone (ELOCON) 0.1 % cream Apply 1 application topically daily. 45 g 0  . Multiple Vitamins-Minerals (MULTI FOR HER 50+) TABS Take 1 tablet by mouth daily.    . potassium chloride (KLOR-CON) 10 MEQ tablet Take 1 tablet by mouth once daily 30 tablet 0  . senna-docusate (SENOKOT-S) 8.6-50 MG tablet Take 2 tablets by mouth 2 (two) times daily. 20 tablet 0  . sitaGLIPtin (JANUVIA) 100 MG tablet Take 1 tablet (100 mg total) by mouth daily. 90 tablet 3  . traMADol (ULTRAM) 50 MG tablet Take by mouth every 6 (six) hours as needed.     . vitamin B-12 (CYANOCOBALAMIN) 500 MCG tablet Take 5,000 mcg by mouth daily.     No current facility-administered medications on file prior to visit.    Allergies  Allergen Reactions  . Metformin And Related Itching    Heart palpitation  . Sulfa Antibiotics Itching  . Sulfasalazine Itching    Objective:  General: Alert and oriented x3 in no acute distress  Dermatology: Dry scaling plantar surfaces and interspaces on left, melanin spots bilateral. No  webspace macerations, no ecchymosis bilateral, all nails x 10 are well manicured with thickening of bilateral hallux and left 5th toenail.  Vascular: Dorsalis Pedis and Posterior Tibial pedal pulses 2/4, Capillary Fill Time 3 seconds, + pedal hair growth bilateral, no edema bilateral lower extremities, Temperature gradient within normal limits.  Neurology: Johney Maine sensation intact via light touch bilateral.  Musculoskeletal: Subjective itchy sensation bilateral. + Pes planus and minimal hammertoe deformity noted.  Assessment and Plan: Problem List Items Addressed This Visit    None    Visit  Diagnoses    Tinea    -  Primary   Relevant Medications   clotrimazole (LOTRIMIN) 1 % external solution   nystatin-triamcinolone ointment (MYCOLOG)   Dry skin       Hyperpigmentation          -Complete examination performed -Discussed treatment options -Rx Mycolog -Rx Clotrimazole solution  -Advisedgood hygiene habits -Patient to return to office for med check or sooner if condition worsens. Advised patient may consider PO treatment if fails to improve.   Landis Martins, DPM

## 2020-01-03 LAB — ADAMTS13 ACTIVITY: Adamts 13 Activity: 85.2 % (ref >66.8)

## 2020-01-03 LAB — ADAMTS13 ACTIVITY REFLEX

## 2020-01-13 ENCOUNTER — Other Ambulatory Visit: Payer: Self-pay | Admitting: Hematology & Oncology

## 2020-01-14 DIAGNOSIS — H40023 Open angle with borderline findings, high risk, bilateral: Secondary | ICD-10-CM | POA: Diagnosis not present

## 2020-01-14 LAB — HM DIABETES EYE EXAM

## 2020-01-16 DIAGNOSIS — K760 Fatty (change of) liver, not elsewhere classified: Secondary | ICD-10-CM | POA: Diagnosis not present

## 2020-01-16 DIAGNOSIS — K7460 Unspecified cirrhosis of liver: Secondary | ICD-10-CM | POA: Diagnosis not present

## 2020-01-16 DIAGNOSIS — K7581 Nonalcoholic steatohepatitis (NASH): Secondary | ICD-10-CM | POA: Diagnosis not present

## 2020-01-17 ENCOUNTER — Other Ambulatory Visit: Payer: Self-pay | Admitting: Family Medicine

## 2020-01-17 ENCOUNTER — Telehealth: Payer: Self-pay | Admitting: *Deleted

## 2020-01-17 MED ORDER — GLUCOSE BLOOD VI STRP: ORAL_STRIP | 12 refills | Status: DC

## 2020-01-17 NOTE — Telephone Encounter (Signed)
Prescription sent to patient's pharmacy as requested.

## 2020-01-17 NOTE — Telephone Encounter (Signed)
Patient left a voicemail stating that she needs a new script for her test strips. Patient stated that Dr. Einar Pheasant has never filled this for her. Patient stated that she is out of the test strips. Patient stated that she uses the one touch ultra blue test stripes and would like a new script sent to [facility], Needs new script to pharmacy with directions.

## 2020-01-22 DIAGNOSIS — M5441 Lumbago with sciatica, right side: Secondary | ICD-10-CM | POA: Diagnosis not present

## 2020-01-22 DIAGNOSIS — M9903 Segmental and somatic dysfunction of lumbar region: Secondary | ICD-10-CM | POA: Diagnosis not present

## 2020-01-22 DIAGNOSIS — M542 Cervicalgia: Secondary | ICD-10-CM | POA: Diagnosis not present

## 2020-01-22 DIAGNOSIS — M9901 Segmental and somatic dysfunction of cervical region: Secondary | ICD-10-CM | POA: Diagnosis not present

## 2020-02-13 ENCOUNTER — Ambulatory Visit: Payer: Federal, State, Local not specified - PPO | Admitting: Family Medicine

## 2020-02-18 ENCOUNTER — Other Ambulatory Visit: Payer: Self-pay

## 2020-02-18 ENCOUNTER — Ambulatory Visit (INDEPENDENT_AMBULATORY_CARE_PROVIDER_SITE_OTHER): Payer: Federal, State, Local not specified - PPO | Admitting: Family Medicine

## 2020-02-18 VITALS — BP 158/82 | HR 95 | Temp 97.7°F | Ht 61.0 in | Wt 174.5 lb

## 2020-02-18 DIAGNOSIS — D61818 Other pancytopenia: Secondary | ICD-10-CM | POA: Diagnosis not present

## 2020-02-18 DIAGNOSIS — E119 Type 2 diabetes mellitus without complications: Secondary | ICD-10-CM

## 2020-02-18 DIAGNOSIS — I10 Essential (primary) hypertension: Secondary | ICD-10-CM

## 2020-02-18 DIAGNOSIS — K7460 Unspecified cirrhosis of liver: Secondary | ICD-10-CM

## 2020-02-18 DIAGNOSIS — R748 Abnormal levels of other serum enzymes: Secondary | ICD-10-CM | POA: Diagnosis not present

## 2020-02-18 DIAGNOSIS — K7581 Nonalcoholic steatohepatitis (NASH): Secondary | ICD-10-CM | POA: Diagnosis not present

## 2020-02-18 DIAGNOSIS — H538 Other visual disturbances: Secondary | ICD-10-CM | POA: Insufficient documentation

## 2020-02-18 DIAGNOSIS — E538 Deficiency of other specified B group vitamins: Secondary | ICD-10-CM

## 2020-02-18 DIAGNOSIS — I152 Hypertension secondary to endocrine disorders: Secondary | ICD-10-CM | POA: Insufficient documentation

## 2020-02-18 LAB — POCT GLYCOSYLATED HEMOGLOBIN (HGB A1C): Hemoglobin A1C: 6.4 % — AB (ref 4.0–5.6)

## 2020-02-18 MED ORDER — OLMESARTAN MEDOXOMIL 20 MG PO TABS: 20.0000 mg | ORAL_TABLET | Freq: Every day | ORAL | 1 refills | Status: DC

## 2020-02-18 NOTE — Assessment & Plan Note (Signed)
BP elevated. Unclear if HA/vision changes related. She will restart Benicar 20 mg. Return in 1 month for blood work and bp check. DASH diet and weight loss.

## 2020-02-18 NOTE — Assessment & Plan Note (Signed)
" >>  ASSESSMENT AND PLAN FOR ESSENTIAL HYPERTENSION WRITTEN ON 02/18/2020  1:57 PM BY Grason Brailsford R, MD  BP elevated. Unclear if HA/vision changes related. She will restart Benicar  20 mg. Return in 1 month for blood work and bp check. DASH diet and weight loss.  "

## 2020-02-18 NOTE — Assessment & Plan Note (Signed)
Reviewed recent stable labs. Will check with next blood draw per request.

## 2020-02-18 NOTE — Assessment & Plan Note (Signed)
Reports no diabetic retinopathy but her doctor wants her to return for glaucoma evaluation. Will treat bp, but encouraged eye doctor f/u.

## 2020-02-18 NOTE — Assessment & Plan Note (Signed)
Reviewed last check which was high. Stop supplement. Recheck in 1 month

## 2020-02-18 NOTE — Assessment & Plan Note (Signed)
Well controlled. Cont Januvia and trulicity. Work on losing weight.

## 2020-02-18 NOTE — Patient Instructions (Addendum)
#Diabetes - doing great! - Avoid additional weight gain  #Trigger - Make an appointment with Dr. Lorelei Pont if worsening  #Headache - eye exam - with doctor - Reach out to ENT - treat the blood pressure  #Hypertension - start Olmesartan 20 mg daily  - Return in 4 weeks for visit Your blood pressure high.   High blood pressure increases your risk for heart attack and stroke.    Please check your blood pressure 2-4 times a week.   To check your blood pressure 1) Sit in a quiet and relaxed place for 5 minutes 2) Make sure your feet are flat on the ground 3) Consider checking first thing in the morning   Normal blood pressure is less than 140/90 Ideally you blood pressure should be around 120/80  Other ways you can reduce your blood pressure:  1) Regular exercise -- Try to get 150 minutes (30 minutes, 5 days a week) of moderate to vigorous aerobic excercise -- Examples: brisk walking (2.5 miles per hour), water aerobics, dancing, gardening, tennis, biking slower than 10 miles per hour 2) DASH Diet - low fat meats, more fresh fruits and vegetables, whole grains, low salt 3) Quit smoking if you smoke 4) Loose 5-10% of your body weight   DASH Eating Plan DASH stands for "Dietary Approaches to Stop Hypertension." The DASH eating plan is a healthy eating plan that has been shown to reduce high blood pressure (hypertension). It may also reduce your risk for type 2 diabetes, heart disease, and stroke. The DASH eating plan may also help with weight loss. What are tips for following this plan?  General guidelines  Avoid eating more than 2,300 mg (milligrams) of salt (sodium) a day. If you have hypertension, you may need to reduce your sodium intake to 1,500 mg a day.  Limit alcohol intake to no more than 1 drink a day for nonpregnant women and 2 drinks a day for men. One drink equals 12 oz of beer, 5 oz of wine, or 1 oz of hard liquor.  Work with your health care provider to  maintain a healthy body weight or to lose weight. Ask what an ideal weight is for you.  Get at least 30 minutes of exercise that causes your heart to beat faster (aerobic exercise) most days of the week. Activities may include walking, swimming, or biking.  Work with your health care provider or diet and nutrition specialist (dietitian) to adjust your eating plan to your individual calorie needs. Reading food labels   Check food labels for the amount of sodium per serving. Choose foods with less than 5 percent of the Daily Value of sodium. Generally, foods with less than 300 mg of sodium per serving fit into this eating plan.  To find whole grains, look for the word "whole" as the first word in the ingredient list. Shopping  Buy products labeled as "low-sodium" or "no salt added."  Buy fresh foods. Avoid canned foods and premade or frozen meals. Cooking  Avoid adding salt when cooking. Use salt-free seasonings or herbs instead of table salt or sea salt. Check with your health care provider or pharmacist before using salt substitutes.  Do not fry foods. Cook foods using healthy methods such as baking, boiling, grilling, and broiling instead.  Cook with heart-healthy oils, such as olive, canola, soybean, or sunflower oil. Meal planning  Eat a balanced diet that includes: ? 5 or more servings of fruits and vegetables each day. At each meal, try  to fill half of your plate with fruits and vegetables. ? Up to 6-8 servings of whole grains each day. ? Less than 6 oz of lean meat, poultry, or fish each day. A 3-oz serving of meat is about the same size as a deck of cards. One egg equals 1 oz. ? 2 servings of low-fat dairy each day. ? A serving of nuts, seeds, or beans 5 times each week. ? Heart-healthy fats. Healthy fats called Omega-3 fatty acids are found in foods such as flaxseeds and coldwater fish, like sardines, salmon, and mackerel.  Limit how much you eat of the following: ? Canned  or prepackaged foods. ? Food that is high in trans fat, such as fried foods. ? Food that is high in saturated fat, such as fatty meat. ? Sweets, desserts, sugary drinks, and other foods with added sugar. ? Full-fat dairy products.  Do not salt foods before eating.  Try to eat at least 2 vegetarian meals each week.  Eat more home-cooked food and less restaurant, buffet, and fast food.  When eating at a restaurant, ask that your food be prepared with less salt or no salt, if possible. What foods are recommended? The items listed may not be a complete list. Talk with your dietitian about what dietary choices are best for you. Grains Whole-grain or whole-wheat bread. Whole-grain or whole-wheat pasta. Brown rice. Modena Morrow. Bulgur. Whole-grain and low-sodium cereals. Pita bread. Low-fat, low-sodium crackers. Whole-wheat flour tortillas. Vegetables Fresh or frozen vegetables (raw, steamed, roasted, or grilled). Low-sodium or reduced-sodium tomato and vegetable juice. Low-sodium or reduced-sodium tomato sauce and tomato paste. Low-sodium or reduced-sodium canned vegetables. Fruits All fresh, dried, or frozen fruit. Canned fruit in natural juice (without added sugar). Meat and other protein foods Skinless chicken or [place]. Ground chicken or [place]. Pork with fat trimmed off. Fish and seafood. Egg whites. Dried beans, peas, or lentils. Unsalted nuts, nut butters, and seeds. Unsalted canned beans. Lean cuts of beef with fat trimmed off. Low-sodium, lean deli meat. Dairy Low-fat (1%) or fat-free (skim) milk. Fat-free, low-fat, or reduced-fat cheeses. Nonfat, low-sodium ricotta or cottage cheese. Low-fat or nonfat yogurt. Low-fat, low-sodium cheese. Fats and oils Soft margarine without trans fats. Vegetable oil. Low-fat, reduced-fat, or light mayonnaise and salad dressings (reduced-sodium). Canola, safflower, olive, soybean, and sunflower oils. Avocado. Seasoning and other foods Herbs. Spices.  Seasoning mixes without salt. Unsalted popcorn and pretzels. Fat-free sweets. What foods are not recommended? The items listed may not be a complete list. Talk with your dietitian about what dietary choices are best for you. Grains Baked goods made with fat, such as croissants, muffins, or some breads. Dry pasta or rice meal packs. Vegetables Creamed or fried vegetables. Vegetables in a cheese sauce. Regular canned vegetables (not low-sodium or reduced-sodium). Regular canned tomato sauce and paste (not low-sodium or reduced-sodium). Regular tomato and vegetable juice (not low-sodium or reduced-sodium). Angie Fava. Olives. Fruits Canned fruit in a light or heavy syrup. Fried fruit. Fruit in cream or butter sauce. Meat and other protein foods Fatty cuts of meat. Ribs. Fried meat. Berniece Salines. Sausage. Bologna and other processed lunch meats. Salami. Fatback. Hotdogs. Bratwurst. Salted nuts and seeds. Canned beans with added salt. Canned or smoked fish. Whole eggs or egg yolks. Chicken or [place] with skin. Dairy Whole or 2% milk, cream, and half-and-half. Whole or full-fat cream cheese. Whole-fat or sweetened yogurt. Full-fat cheese. Nondairy creamers. Whipped toppings. Processed cheese and cheese spreads. Fats and oils Butter. Stick margarine. Lard. Shortening. Ghee. Berniece Salines  fat. Tropical oils, such as coconut, palm kernel, or palm oil. Seasoning and other foods Salted popcorn and pretzels. Onion salt, garlic salt, seasoned salt, table salt, and sea salt. Worcestershire sauce. Tartar sauce. Barbecue sauce. Teriyaki sauce. Soy sauce, including reduced-sodium. Steak sauce. Canned and packaged gravies. Fish sauce. Oyster sauce. Cocktail sauce. Horseradish that you find on the shelf. Ketchup. Mustard. Meat flavorings and tenderizers. Bouillon cubes. Hot sauce and Tabasco sauce. Premade or packaged marinades. Premade or packaged taco seasonings. Relishes. Regular salad dressings. Where to find more  information:  National Heart, Lung, and [place]: https://wilson-eaton.com/  American Heart Association: www.heart.org Summary  The DASH eating plan is a healthy eating plan that has been shown to reduce high blood pressure (hypertension). It may also reduce your risk for type 2 diabetes, heart disease, and stroke.  With the DASH eating plan, you should limit salt (sodium) intake to 2,300 mg a day. If you have hypertension, you may need to reduce your sodium intake to 1,500 mg a day.  When on the DASH eating plan, aim to eat more fresh fruits and vegetables, whole grains, lean proteins, low-fat dairy, and heart-healthy fats.  Work with your health care provider or diet and nutrition specialist (dietitian) to adjust your eating plan to your individual calorie needs. This information is not intended to replace advice given to you by your health care provider. Make sure you discuss any questions you have with your health care provider. Document Revised: 02/03/2017 Document Reviewed: 02/15/2016 Elsevier Patient Education  2020 [employer].

## 2020-02-18 NOTE — Assessment & Plan Note (Signed)
Stable. Follows with hepatology. Will repeat labs.

## 2020-02-18 NOTE — Progress Notes (Signed)
Subjective:     Vicki [name] is a 59 y.o. female presenting for Follow-up (6 month- DM ), trigger finger (R ring finger- "locks"), and Headache (W/ blurry vision x 3-4 weeks )     HPI   #Diabetes Currently taking januvia and trulicity  Using medications without difficulties: No Hypoglycemic episodes:No  Hyperglycemic episodes:No  Feet problems:No  Blood Sugars averaging: 110-150 Last HgbA1c:  Lab Results  Component Value Date   HGBA1C 6.4 (A) 02/18/2020    Diabetes Health Maintenance Due:    Diabetes Health Maintenance Due  Topic Date Due  . URINE MICROALBUMIN  02/26/2020  . OPHTHALMOLOGY EXAM  06/27/2020  . FOOT EXAM  08/14/2020  . HEMOGLOBIN A1C  08/18/2020    #Trigger finger - recently daily - at night more so - the 4th digit on the right hand - mom and sister with similar issues  #HA - blurry vision for a few weeks - sinus location - hx of HA - can hear a wheezing when she is breathing - similar to other HA - not a lot of congestion but thinks it is sinus HA -   #HTN - weight gain of 15 lbs since September - retired - not as active - has been eating more fast food recently - knows she needs to exercise -   Has noticed some difficulty with memory  Review of Systems   Social History   Tobacco Use  Smoking Status Never Smoker  Smokeless Tobacco Never Used        Objective:    BP Readings from Last 3 Encounters:  02/18/20 (!) 158/82  01/01/20 138/77  11/14/19 (!) 142/78   Wt Readings from Last 3 Encounters:  02/18/20 174 lb 8 oz (79.2 kg)  01/01/20 164 lb 12.8 oz (74.8 kg)  11/14/19 158 lb 3.2 oz (71.8 kg)    BP (!) 158/82   Pulse 95   Temp 97.7 F (36.5 C) (Temporal)   Ht 5' 1"  (1.549 m)   Wt 174 lb 8 oz (79.2 kg)   LMP 04/30/2012   SpO2 98%   BMI 32.97 kg/m    Physical Exam Constitutional:      General: She is not in acute distress.    Appearance: She is well-developed. She is not diaphoretic.  HENT:      Right Ear: External ear normal.     Left Ear: External ear normal.     Nose: Nose normal.     Right Sinus: No maxillary sinus tenderness or frontal sinus tenderness.     Left Sinus: No maxillary sinus tenderness or frontal sinus tenderness.  Eyes:     Conjunctiva/sclera: Conjunctivae normal.  Cardiovascular:     Rate and Rhythm: Normal rate and regular rhythm.     Heart sounds: No murmur heard.   Pulmonary:     Effort: Pulmonary effort is normal. No respiratory distress.     Breath sounds: Normal breath sounds. No wheezing.  Musculoskeletal:     Cervical back: Neck supple.  Skin:    General: Skin is warm and dry.     Capillary Refill: Capillary refill takes less than 2 seconds.  Neurological:     Mental Status: She is alert. Mental status is at baseline.  Psychiatric:        Mood and Affect: Mood normal.        Behavior: Behavior normal.           Assessment & Plan:   Problem List Items  Addressed This Visit      Cardiovascular and Mediastinum   Essential hypertension    BP elevated. Unclear if HA/vision changes related. She will restart Benicar 20 mg. Return in 1 month for blood work and bp check. DASH diet and weight loss.       Relevant Medications   olmesartan (BENICAR) 20 MG tablet   Other Relevant Orders   Comprehensive metabolic panel     Digestive   Liver cirrhosis secondary to NASH ([facility]) (Chronic)    Stable. Follows with hepatology. Will repeat labs.         Endocrine   Controlled type 2 diabetes mellitus without complication ([place]) - Primary (Chronic)    Well controlled. Cont Januvia and trulicity. Work on losing weight.       Relevant Medications   olmesartan (BENICAR) 20 MG tablet   Other Relevant Orders   POCT glycosylated hemoglobin (Hb A1C) (Completed)     Hematopoietic and Hemostatic   Acquired pancytopenia ([place])    Reviewed recent stable labs. Will check with next blood draw per request.       Relevant Orders   CBC with Differential    Vitamin B12     Other   Elevated liver enzymes (Chronic)   Relevant Orders   Comprehensive metabolic panel   Vitamin K35 deficiency    Reviewed last check which was high. Stop supplement. Recheck in 1 month      Relevant Orders   Vitamin B12   Blurred vision    Reports no diabetic retinopathy but her doctor wants her to return for glaucoma evaluation. Will treat bp, but encouraged eye doctor f/u.           Return in about 4 weeks (around 03/17/2020) for blood pressure.  Lesleigh Noe, MD  This visit occurred during the SARS-CoV-2 public health emergency.  Safety protocols were in place, including screening questions prior to the visit, additional usage of staff PPE, and extensive cleaning of exam room while observing appropriate contact time as indicated for disinfecting solutions.

## 2020-02-20 ENCOUNTER — Ambulatory Visit: Payer: Federal, State, Local not specified - PPO | Admitting: Sports Medicine

## 2020-03-02 ENCOUNTER — Other Ambulatory Visit: Payer: Self-pay | Admitting: Family Medicine

## 2020-03-02 ENCOUNTER — Other Ambulatory Visit: Payer: Self-pay | Admitting: Hematology & Oncology

## 2020-03-02 DIAGNOSIS — E119 Type 2 diabetes mellitus without complications: Secondary | ICD-10-CM

## 2020-03-11 ENCOUNTER — Telehealth: Payer: Self-pay | Admitting: Hematology & Oncology

## 2020-03-11 NOTE — Telephone Encounter (Signed)
Faxed medical records to: [facility]: Accident 05-22-1960 CASE: [id number] DATE REQ: 09/30/2019-PRESENT

## 2020-03-16 ENCOUNTER — Telehealth: Payer: Self-pay | Admitting: Physician Assistant

## 2020-03-16 NOTE — Telephone Encounter (Signed)
error 

## 2020-03-20 ENCOUNTER — Other Ambulatory Visit: Payer: Self-pay

## 2020-03-20 ENCOUNTER — Other Ambulatory Visit (INDEPENDENT_AMBULATORY_CARE_PROVIDER_SITE_OTHER): Payer: Federal, State, Local not specified - PPO

## 2020-03-20 DIAGNOSIS — I10 Essential (primary) hypertension: Secondary | ICD-10-CM | POA: Diagnosis not present

## 2020-03-20 DIAGNOSIS — D61818 Other pancytopenia: Secondary | ICD-10-CM | POA: Diagnosis not present

## 2020-03-20 DIAGNOSIS — R748 Abnormal levels of other serum enzymes: Secondary | ICD-10-CM | POA: Diagnosis not present

## 2020-03-20 DIAGNOSIS — E538 Deficiency of other specified B group vitamins: Secondary | ICD-10-CM | POA: Diagnosis not present

## 2020-03-20 LAB — CBC WITH DIFFERENTIAL/PLATELET
Basophils Absolute: 0 10*3/uL (ref 0.0–0.1)
Basophils Relative: 0.8 % (ref 0.0–3.0)
Eosinophils Absolute: 0.3 10*3/uL (ref 0.0–0.7)
Eosinophils Relative: 9.3 % — ABNORMAL HIGH (ref 0.0–5.0)
HCT: 37.9 % (ref 36.0–46.0)
Hemoglobin: 12.7 g/dL (ref 12.0–15.0)
Lymphocytes Relative: 37 % (ref 12.0–46.0)
Lymphs Abs: 1.2 10*3/uL (ref 0.7–4.0)
MCHC: 33.4 g/dL (ref 30.0–36.0)
MCV: 89.7 fl (ref 78.0–100.0)
Monocytes Absolute: 0.2 10*3/uL (ref 0.1–1.0)
Monocytes Relative: 6.8 % (ref 3.0–12.0)
Neutro Abs: 1.5 10*3/uL (ref 1.4–7.7)
Neutrophils Relative %: 46.1 % (ref 43.0–77.0)
Platelets: 132 10*3/uL — ABNORMAL LOW (ref 150.0–400.0)
RBC: 4.23 Mil/uL (ref 3.87–5.11)
RDW: 13.1 % (ref 11.5–15.5)
WBC: 3.3 10*3/uL — ABNORMAL LOW (ref 4.0–10.5)

## 2020-03-20 LAB — COMPREHENSIVE METABOLIC PANEL
ALT: 26 U/L (ref 0–35)
AST: 38 U/L — ABNORMAL HIGH (ref 0–37)
Albumin: 4.2 g/dL (ref 3.5–5.2)
Alkaline Phosphatase: 95 U/L (ref 39–117)
BUN: 10 mg/dL (ref 6–23)
CO2: 25 mEq/L (ref 19–32)
Calcium: 10.1 mg/dL (ref 8.4–10.5)
Chloride: 106 mEq/L (ref 96–112)
Creatinine, Ser: 1.11 mg/dL (ref 0.40–1.20)
GFR: 54.02 mL/min — ABNORMAL LOW (ref >60.00)
Glucose, Bld: 154 mg/dL — ABNORMAL HIGH (ref 70–99)
Potassium: 3.7 mEq/L (ref 3.5–5.1)
Sodium: 139 mEq/L (ref 135–145)
Total Bilirubin: 0.7 mg/dL (ref 0.2–1.2)
Total Protein: 7.8 g/dL (ref 6.0–8.3)

## 2020-03-20 LAB — VITAMIN B12: Vitamin B-12: >1526 pg/mL — ABNORMAL HIGH (ref 211–911)

## 2020-03-23 ENCOUNTER — Ambulatory Visit: Payer: Federal, State, Local not specified - PPO | Admitting: Family Medicine

## 2020-03-25 ENCOUNTER — Telehealth: Payer: Self-pay

## 2020-03-25 NOTE — Telephone Encounter (Signed)
Pt left v/m requesting cb about recent lab results; pt is wanting blood count levels and plts. Sending note to Dr Einar Pheasant and Adonis Brook CMA.

## 2020-03-26 ENCOUNTER — Other Ambulatory Visit: Payer: Self-pay | Admitting: Hematology & Oncology

## 2020-03-26 NOTE — Telephone Encounter (Signed)
Called and spoke to pt. See result note in labs.

## 2020-03-26 NOTE — Telephone Encounter (Signed)
See result note.  

## 2020-03-28 ENCOUNTER — Other Ambulatory Visit: Payer: Self-pay | Admitting: Hematology & Oncology

## 2020-03-30 ENCOUNTER — Ambulatory Visit: Payer: Federal, State, Local not specified - PPO | Admitting: Family Medicine

## 2020-04-02 ENCOUNTER — Inpatient Hospital Stay (HOSPITAL_BASED_OUTPATIENT_CLINIC_OR_DEPARTMENT_OTHER): Payer: Federal, State, Local not specified - PPO | Admitting: Hematology & Oncology

## 2020-04-02 ENCOUNTER — Telehealth: Payer: Self-pay

## 2020-04-02 ENCOUNTER — Other Ambulatory Visit: Payer: Self-pay

## 2020-04-02 ENCOUNTER — Inpatient Hospital Stay: Payer: Federal, State, Local not specified - PPO | Attending: Hematology & Oncology

## 2020-04-02 VITALS — BP 137/81 | HR 86 | Temp 99.3°F | Resp 18 | Wt 176.0 lb

## 2020-04-02 DIAGNOSIS — K7460 Unspecified cirrhosis of liver: Secondary | ICD-10-CM | POA: Diagnosis not present

## 2020-04-02 DIAGNOSIS — M7989 Other specified soft tissue disorders: Secondary | ICD-10-CM | POA: Insufficient documentation

## 2020-04-02 DIAGNOSIS — M3119 Other thrombotic microangiopathy: Secondary | ICD-10-CM | POA: Diagnosis not present

## 2020-04-02 DIAGNOSIS — K7581 Nonalcoholic steatohepatitis (NASH): Secondary | ICD-10-CM | POA: Diagnosis not present

## 2020-04-02 DIAGNOSIS — E119 Type 2 diabetes mellitus without complications: Secondary | ICD-10-CM | POA: Insufficient documentation

## 2020-04-02 LAB — RETICULOCYTES
Immature Retic Fract: 6.9 % (ref 2.3–15.9)
RBC.: 4.08 MIL/uL (ref 3.87–5.11)
Retic Count, Absolute: 42.8 10*3/uL (ref 19.0–186.0)
Retic Ct Pct: 1.1 % (ref 0.4–3.1)

## 2020-04-02 LAB — CBC WITH DIFFERENTIAL (CANCER CENTER ONLY)
Abs Immature Granulocytes: 0.01 10*3/uL (ref 0.00–0.07)
Basophils Absolute: 0 10*3/uL (ref 0.0–0.1)
Basophils Relative: 0 %
Eosinophils Absolute: 0.2 10*3/uL (ref 0.0–0.5)
Eosinophils Relative: 6 %
HCT: 36.1 % (ref 36.0–46.0)
Hemoglobin: 12.4 g/dL (ref 12.0–15.0)
Immature Granulocytes: 0 %
Lymphocytes Relative: 40 %
Lymphs Abs: 1.5 10*3/uL (ref 0.7–4.0)
MCH: 30.6 pg (ref 26.0–34.0)
MCHC: 34.3 g/dL (ref 30.0–36.0)
MCV: 89.1 fL (ref 80.0–100.0)
Monocytes Absolute: 0.3 10*3/uL (ref 0.1–1.0)
Monocytes Relative: 7 %
Neutro Abs: 1.7 10*3/uL (ref 1.7–7.7)
Neutrophils Relative %: 47 %
Platelet Count: 108 10*3/uL — ABNORMAL LOW (ref 150–400)
RBC: 4.05 MIL/uL (ref 3.87–5.11)
RDW: 12.7 % (ref 11.5–15.5)
WBC Count: 3.7 10*3/uL — ABNORMAL LOW (ref 4.0–10.5)
nRBC: 0 % (ref 0.0–0.2)

## 2020-04-02 LAB — CMP (CANCER CENTER ONLY)
ALT: 31 U/L (ref 0–44)
AST: 45 U/L — ABNORMAL HIGH (ref 15–41)
Albumin: 4 g/dL (ref 3.5–5.0)
Alkaline Phosphatase: 94 U/L (ref 38–126)
Anion gap: 5 (ref 5–15)
BUN: 11 mg/dL (ref 6–20)
CO2: 28 mmol/L (ref 22–32)
Calcium: 10.3 mg/dL (ref 8.9–10.3)
Chloride: 105 mmol/L (ref 98–111)
Creatinine: 0.85 mg/dL (ref 0.44–1.00)
GFR, Estimated: >60 mL/min (ref >60)
Glucose, Bld: 136 mg/dL — ABNORMAL HIGH (ref 70–99)
Potassium: 3.7 mmol/L (ref 3.5–5.1)
Sodium: 138 mmol/L (ref 135–145)
Total Bilirubin: 0.9 mg/dL (ref 0.3–1.2)
Total Protein: 7.5 g/dL (ref 6.5–8.1)

## 2020-04-02 LAB — SAVE SMEAR(SSMR), FOR PROVIDER SLIDE REVIEW

## 2020-04-02 LAB — LACTATE DEHYDROGENASE: LDH: 142 U/L (ref 98–192)

## 2020-04-02 NOTE — Telephone Encounter (Signed)
appts made and printed for pt per los   Vicki [name]

## 2020-04-02 NOTE — Progress Notes (Signed)
Hematology and Oncology Follow Up Visit  Sherrelle Prochazka [id number] 02/05/1961 60 y.o. 04/02/2020   Principle Diagnosis:   TTP - acquired -- relapsed  NASH -- leukopenia/thrombocytopenia  Current Therapy:    Plasma Exchange - M-Th -- d/c on 08/01/2019  Rituxan 375 mg/m2 IV weekly -- s/p cycle #4 - completed on 06/04/2019     Interim History:  Ms. Torti is back for her follow-up.  We last saw her back in October.  At that, she was seen at [facility].  She had a ultrasound of the liver on 01/16/2020.  This just showed cirrhosis.  Nothing else was noted in the liver.  She does have some arthritic issues.  She has some stiffness in her joints.  She is little bit of swelling in the left leg which has been chronic.  She has had no change in bowel or bladder habits.  She has had no bleeding.  She has had no fever.  She had the COVID back in September.  She still is dealing with some of the reactions.  We last saw her in October, her ADAMTS-13 was 85%.  She has had no headache.  Overall, I would have to say her performance status is ECOG 1.     Medications:  Current Outpatient Medications:  .  fexofenadine (ALLEGRA) 180 MG tablet, Take 180 mg by mouth daily as needed for allergies or rhinitis., Disp: , Rfl:  .  MAGNESIUM GLYCINATE PO, Take 200 mg by mouth daily., Disp: , Rfl:  .  ZINC PICOLINATE PO, Take 30 mg by mouth daily., Disp: , Rfl:  .  acetaminophen (TYLENOL) 325 MG tablet, Take 650 mg by mouth every 6 (six) hours as needed., Disp: , Rfl:  .  Alum Hydroxide-Mag Carbonate (GAVISCON EXTRA STRENGTH PO), Take 1 tablet by mouth 4 (four) times daily as needed., Disp: , Rfl:  .  Ascorbic Acid (VITAMIN C) 500 MG CAPS, Take by mouth daily., Disp: , Rfl:  .  cholecalciferol (VITAMIN D3) 25 MCG (1000 UT) tablet, Take 1,000 Units by mouth daily., Disp: , Rfl:  .  clotrimazole (LOTRIMIN) 1 % external solution, Apply 1 application topically at bedtime. In between toes and to toenails at  bedtime, Disp: 60 mL, Rfl: 5 .  dextromethorphan-guaiFENesin (MUCINEX DM) 30-600 MG 12hr tablet, Take 2 tablets by mouth 2 (two) times daily as needed for cough., Disp: , Rfl:  .  folic acid (FOLVITE) 1 MG tablet, Take 2 tablets (2 mg total) by mouth daily., Disp: 60 tablet, Rfl: 12 .  furosemide (LASIX) 40 MG tablet, Take 1 tablet by mouth once daily, Disp: 30 tablet, Rfl: 0 .  glucose blood test strip, Use as instructed, Disp: 100 each, Rfl: 12 .  Homeopathic Products (LIVER SUPPORT SL), Take by mouth in the morning and at bedtime., Disp: , Rfl:  .  MAGNESIUM OXIDE PO, Take by mouth every other day. , Disp: , Rfl:  .  mometasone (ELOCON) 0.1 % cream, APPLY  CREAM EXTERNALLY ONCE DAILY, Disp: 45 g, Rfl: 0 .  Multiple Vitamins-Minerals (MULTI FOR HER 50+) TABS, Take 1 tablet by mouth daily., Disp: , Rfl:  .  nystatin-triamcinolone ointment (MYCOLOG), Apply 1 application topically 2 (two) times daily., Disp: 30 g, Rfl: 0 .  olmesartan (BENICAR) 20 MG tablet, Take 1 tablet (20 mg total) by mouth daily., Disp: 90 tablet, Rfl: 1 .  potassium chloride (KLOR-CON) 10 MEQ tablet, TAKE 1  BY MOUTH ONCE DAILY, Disp: 30 tablet, Rfl: 0 .  senna-docusate (SENOKOT-S) 8.6-50 MG tablet, Take 2 tablets by mouth 2 (two) times daily., Disp: 20 tablet, Rfl: 0 .  sitaGLIPtin (JANUVIA) 100 MG tablet, Take 1 tablet (100 mg total) by mouth daily., Disp: 90 tablet, Rfl: 3 .  traMADol (ULTRAM) 50 MG tablet, Take by mouth every 6 (six) hours as needed. , Disp: , Rfl:  .  TRULICITY 6.83 FG/9.0SX SOPN, INJECT 0.75 MG INTO THE SKIN ONCE A WEEK, Disp: 2 mL, Rfl: 2 .  vitamin B-12 (CYANOCOBALAMIN) 500 MCG tablet, Take 5,000 mcg by mouth daily., Disp: , Rfl:   Allergies:  Allergies  Allergen Reactions  . Metformin Other (See Comments)  . Other Other (See Comments)  . Metformin And Related Itching    Heart palpitation  . Sulfa Antibiotics Itching  . Sulfasalazine Itching    Past Medical History, Surgical history, Social  history, and Family History were reviewed and updated.  Review of Systems: Review of Systems  Constitutional: Negative.   HENT:  Negative.   Eyes: Negative.   Respiratory: Negative.   Cardiovascular: Positive for leg swelling.  Gastrointestinal: Negative.   Endocrine: Negative.   Genitourinary: Negative.    Musculoskeletal: Positive for arthralgias and myalgias.  Skin: Negative.   Neurological: Negative.   Hematological: Negative.   Psychiatric/Behavioral: Negative.     Physical Exam:  weight is 176 lb (79.8 kg). Her oral temperature is 99.3 F (37.4 C). Her blood pressure is 137/81 and her pulse is 86. Her respiration is 18 and oxygen saturation is 100%.   Wt Readings from Last 3 Encounters:  04/02/20 176 lb (79.8 kg)  02/18/20 174 lb 8 oz (79.2 kg)  01/01/20 164 lb 12.8 oz (74.8 kg)    Physical Exam Vitals reviewed.  HENT:     Head: Normocephalic and atraumatic.  Eyes:     Pupils: Pupils are equal, round, and reactive to light.  Cardiovascular:     Rate and Rhythm: Normal rate and regular rhythm.     Heart sounds: Normal heart sounds.  Pulmonary:     Effort: Pulmonary effort is normal.     Breath sounds: Normal breath sounds.  Abdominal:     General: Bowel sounds are normal.     Palpations: Abdomen is soft.  Musculoskeletal:        General: No tenderness or deformity. Normal range of motion.     Cervical back: Normal range of motion.     Comments: Extremities shows some 1+ edema in her lower legs.  She has good range of motion of her joints.  She has good strength in upper and lower extremities.  Lymphadenopathy:     Cervical: No cervical adenopathy.  Skin:    General: Skin is warm and dry.     Findings: No erythema or rash.  Neurological:     Mental Status: She is alert and oriented to person, place, and time.  Psychiatric:        Behavior: Behavior normal.        Thought Content: Thought content normal.        Judgment: Judgment normal.      Lab  Results  Component Value Date   WBC 3.7 (L) 04/02/2020   HGB 12.4 04/02/2020   HCT 36.1 04/02/2020   MCV 89.1 04/02/2020   PLT 108 (L) 04/02/2020     Chemistry      Component Value Date/Time   NA 138 04/02/2020 1425   K 3.7 04/02/2020 1425   CL 105 04/02/2020 1425  CO2 28 04/02/2020 1425   BUN 11 04/02/2020 1425   CREATININE 0.85 04/02/2020 1425   CREATININE 0.70 09/25/2015 1504      Component Value Date/Time   CALCIUM 10.3 04/02/2020 1425   ALKPHOS 94 04/02/2020 1425   AST 45 (H) 04/02/2020 1425   ALT 31 04/02/2020 1425   BILITOT 0.9 04/02/2020 1425       Impression and Plan: Ms. Popescu is a 60 year old African-American female.  She has acquired TTP.  She had relapsed.  She responded well to treatment with plasma exchange and Rituxan.  She is now has been off treatment now for over 6 months.  We will have to see what the ADAMTS-13 is.  I will get her blood smear.  I do not see anything under the blood smear that look like TTP activation.  I still think that her prognosis will be based on her cirrhosis and her NASH and diabetes.  We will plan to get her back in another 4 months or so.  I think this would be a reasonable amount of time for follow-up.    Volanda Napoleon, MD 1/27/20223:00 PM

## 2020-04-03 LAB — ADAMTS13 ACTIVITY: Adamts 13 Activity: >100 % (ref >66.8)

## 2020-04-03 LAB — ADAMTS13 ACTIVITY REFLEX

## 2020-04-06 ENCOUNTER — Encounter: Payer: Self-pay | Admitting: Family Medicine

## 2020-04-06 ENCOUNTER — Other Ambulatory Visit: Payer: Self-pay

## 2020-04-06 ENCOUNTER — Ambulatory Visit: Payer: Federal, State, Local not specified - PPO | Admitting: Family Medicine

## 2020-04-06 VITALS — BP 136/78 | HR 71 | Temp 98.2°F | Ht 61.0 in | Wt 172.8 lb

## 2020-04-06 DIAGNOSIS — M3119 Other thrombotic microangiopathy: Secondary | ICD-10-CM

## 2020-04-06 DIAGNOSIS — E119 Type 2 diabetes mellitus without complications: Secondary | ICD-10-CM | POA: Diagnosis not present

## 2020-04-06 DIAGNOSIS — I10 Essential (primary) hypertension: Secondary | ICD-10-CM | POA: Diagnosis not present

## 2020-04-06 DIAGNOSIS — K7460 Unspecified cirrhosis of liver: Secondary | ICD-10-CM

## 2020-04-06 DIAGNOSIS — K7581 Nonalcoholic steatohepatitis (NASH): Secondary | ICD-10-CM | POA: Diagnosis not present

## 2020-04-06 DIAGNOSIS — M654 Radial styloid tenosynovitis [de Quervain]: Secondary | ICD-10-CM | POA: Diagnosis not present

## 2020-04-06 NOTE — Assessment & Plan Note (Signed)
" >>  ASSESSMENT AND PLAN FOR ESSENTIAL HYPERTENSION WRITTEN ON 04/06/2020 11:26 AM BY Deserai Cansler R, MD  Discussed goal of <130/90 and benicar  20 mg for kidney protection. She will restart the medication.  "

## 2020-04-06 NOTE — Assessment & Plan Note (Signed)
Pt concerned about AST elevated. Reviewed last 3 blood tests with stable AST. Continue to limit alcohol. Reassurance.

## 2020-04-06 NOTE — Assessment & Plan Note (Addendum)
Discussed that overall her platelets are stable. Follows with Dr. Moishe Spice who was not concerned about platelets 108. Overall 110-120s over the last year. Reassurance. Appreciate hematology support. Encouraged dentist check up for gum bleeding - as only with brushing and localized to one spot

## 2020-04-06 NOTE — Progress Notes (Signed)
Subjective:     Vicki [name] is a 60 y.o. female presenting for Follow-up     HPI  # HTN - has not been taking her blood pressure medication - has been checking bp at home - 130-140/78-81  #Low platelets - follows with hematology - not sure why this drop - endorses some bleeding with brushing her teeth - in the same spot  #Hand pain - on the right hand - pain in the wrist and along the thumb - as well as the 4th digit - also has wrist pain  Review of Systems   Social History   Tobacco Use  Smoking Status Never Smoker  Smokeless Tobacco Never Used        Objective:    BP Readings from Last 3 Encounters:  04/06/20 136/78  04/02/20 137/81  02/18/20 (!) 158/82   Wt Readings from Last 3 Encounters:  04/06/20 172 lb 12 oz (78.4 kg)  04/02/20 176 lb (79.8 kg)  02/18/20 174 lb 8 oz (79.2 kg)    BP 136/78   Pulse 71   Temp 98.2 F (36.8 C) (Temporal)   Ht 5' 1"  (1.549 m)   Wt 172 lb 12 oz (78.4 kg)   LMP 04/30/2012   SpO2 98%   BMI 32.64 kg/m    Physical Exam Constitutional:      General: She is not in acute distress.    Appearance: She is well-developed. She is not diaphoretic.  HENT:     Right Ear: External ear normal.     Left Ear: External ear normal.     Nose: Nose normal.  Eyes:     Conjunctiva/sclera: Conjunctivae normal.  Cardiovascular:     Rate and Rhythm: Normal rate.  Pulmonary:     Effort: Pulmonary effort is normal.  Musculoskeletal:     Cervical back: Neck supple.  Skin:    General: Skin is warm and dry.     Capillary Refill: Capillary refill takes less than 2 seconds.     Comments: Right Hand Inspection: 2nd digit with burn injury and mild swelling compared to the left. Otherwise no erythema or swelling ROM: 4th digit difficulty fully flexing in fist position, pain with wrist inversion Strength: grip strength slightly diminished compared to the left Pain along the lateral wrist with [place] testing   Neurological:     Mental Status: She is alert. Mental status is at baseline.  Psychiatric:        Mood and Affect: Mood normal.        Behavior: Behavior normal.           Assessment & Plan:   Problem List Items Addressed This Visit      Cardiovascular and Mediastinum   TTP (thrombotic thrombocytopenic purpura) (Chronic)    Discussed that overall her platelets are stable. Follows with Dr. Moishe Spice who was not concerned about platelets 108. Overall 110-120s over the last year. Reassurance. Appreciate hematology support. Encouraged dentist check up for gum bleeding - as only with brushing and localized to one spot      Essential hypertension    Discussed goal of <130/90 and benicar 20 mg for kidney protection. She will restart the medication.         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 Reviewed last 3 blood tests with stable AST. Continue to limit alcohol. Reassurance.         Endocrine  Controlled type 2 diabetes mellitus without complication (HCC) (Chronic)    Lab Results  Component Value Date   HGBA1C 6.4 (A) 02/18/2020   She would like to stop trulicity. She has 1 month left. Given last HgbA1c it is reasonable to trial one agent. Stop trulicity in 1 month and return in 4 months for recheck. Cont diabetic diet. 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 Advise thumb spica brace and voltaren gel. She will follow-up if grip strength not improving and will plan PT referral          Return in about 4 months (around 08/04/2020) for Diabetes - from stopping Trulicity.  Lesleigh Noe, MD  This visit occurred during the SARS-CoV-2 public health emergency.  Safety protocols were in place, including screening questions prior to the visit, additional usage of staff PPE, and extensive  cleaning of exam room while observing appropriate contact time as indicated for disinfecting solutions.

## 2020-04-06 NOTE — Assessment & Plan Note (Signed)
Lab Results  Component Value Date   HGBA1C 6.4 (A) 02/18/2020   She would like to stop trulicity. She has 1 month left. Given last HgbA1c it is reasonable to trial one agent. Stop trulicity in 1 month and return in 4 months for recheck. Cont diabetic diet. Restart benicar for kidney protection

## 2020-04-06 NOTE — Assessment & Plan Note (Signed)
Discussed goal of <130/90 and benicar 20 mg for kidney protection. She will restart the medication.

## 2020-04-06 NOTE — Patient Instructions (Addendum)
#   Blood pressure - Take the Benicar medication - goal blood pressure <130/90 - this will help protect the kidneys as well   #Wrist pain/finger pain - can try voltaren gel (topical anti-inflammatory)  - Thumb Spica splint -- but don't a reversible one, make sure it comes at least 6 inches or so past the wrist - wear at night to begin with - can wear during the day too  - call if finger pain or grip strength not improving and can try therapy referral   Dr. Lorelei Pont - sports medicine  Diabetes - stop trulicity after you finish the next month - return in 4 months

## 2020-04-06 NOTE — Assessment & Plan Note (Signed)
Suspect wrist pain 2/2 to this. Advise thumb spica brace and voltaren gel. She will follow-up if grip strength not improving and will plan PT referral

## 2020-04-09 ENCOUNTER — Telehealth: Payer: Self-pay

## 2020-04-09 ENCOUNTER — Ambulatory Visit: Payer: Federal, State, Local not specified - PPO | Admitting: Sports Medicine

## 2020-04-09 ENCOUNTER — Encounter: Payer: Self-pay | Admitting: Sports Medicine

## 2020-04-09 ENCOUNTER — Other Ambulatory Visit: Payer: Self-pay

## 2020-04-09 DIAGNOSIS — I10 Essential (primary) hypertension: Secondary | ICD-10-CM

## 2020-04-09 DIAGNOSIS — B359 Dermatophytosis, unspecified: Secondary | ICD-10-CM

## 2020-04-09 DIAGNOSIS — E119 Type 2 diabetes mellitus without complications: Secondary | ICD-10-CM | POA: Diagnosis not present

## 2020-04-09 DIAGNOSIS — L853 Xerosis cutis: Secondary | ICD-10-CM

## 2020-04-09 MED ORDER — LOSARTAN POTASSIUM 50 MG PO TABS: 50.0000 mg | ORAL_TABLET | Freq: Every day | ORAL | 3 refills | Status: DC

## 2020-04-09 NOTE — Telephone Encounter (Signed)
Pt lvm stating the Benicar is causing itching all over and abd pain.  Says she took a Zyrtec.  Pt is asking if there is something else she can try.  Plz advise pt at [phone].  Ok to lvm.

## 2020-04-09 NOTE — Progress Notes (Signed)
Subjective: Vicki [name] is a 60 y.o. female patient who presents to office for follow-up evaluation of tinea to both feet.  Patient is currently using clotrimazole solution in between toes and Mycolog cream to the plantar surfaces and reports that her skin appears to be getting better and her nails with the solution seem like they are growing healthy.  Patient denies any medication reactions or adverse events with use.  No other pedal complaints noted.  Patient Active Problem List   Diagnosis Date Noted  . Radial styloid tenosynovitis (de quervain) 04/06/2020  . Essential hypertension 02/18/2020  . Vitamin B12 deficiency 02/18/2020  . Blurred vision 02/18/2020  . Other fatigue 11/06/2019  . Hair loss 08/29/2019  . Normocytic anemia 08/15/2019  . Cellulitis 05/16/2019  . Cardiac murmur 05/08/2019  . Blood glucose elevated 04/23/2019  . Boil of groin 04/23/2019  . 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 Insomnia 02/26/2019  . Acquired pancytopenia ([place]) 11/01/2018  . Liver cirrhosis secondary to NASH ([place]) 04/25/2018  . 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 Spinal stenosis of lumbar region 01/17/2018  . Palpitations 04/15/2015  . Controlled type 2 diabetes mellitus without complication ([place]) 79/04/4095  . Elevated liver enzymes 03/31/2014  . Tinea pedis 07/31/2013  . Foot and toe(s), blister, infected 07/31/2013  . 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 Pain in joint, ankle and foot 08/31/2012    Current Outpatient Medications on File Prior to Visit  Medication Sig Dispense Refill  . acetaminophen (TYLENOL) 325 MG tablet Take 650 mg by mouth every 6 (six) hours as needed.    . Alum Hydroxide-Mag Carbonate (GAVISCON EXTRA STRENGTH PO) Take 1 tablet by mouth 4 (four) times daily as needed.    . Ascorbic Acid (VITAMIN C) 500 MG CAPS Take by mouth daily.    . cholecalciferol (VITAMIN D3) 25 MCG (1000 UT) tablet Take 1,000 Units by mouth  daily.    . clotrimazole (LOTRIMIN) 1 % external solution Apply 1 application topically at bedtime. In between toes and to toenails at bedtime 60 mL 5  . dextromethorphan-guaiFENesin (MUCINEX DM) 30-600 MG 12hr tablet Take 2 tablets by mouth 2 (two) times daily as needed for cough.    . fexofenadine (ALLEGRA) 180 MG tablet Take 180 mg by mouth daily as needed for allergies or rhinitis.    . folic acid (FOLVITE) 1 MG tablet Take 2 tablets (2 mg total) by mouth daily. 60 tablet 12  . glucose blood test strip Use as instructed 100 each 12  . Homeopathic Products (LIVER SUPPORT SL) Take by mouth in the morning and at bedtime.    [name] MAGNESIUM GLYCINATE PO Take 200 mg by mouth daily.    . mometasone (ELOCON) 0.1 % cream APPLY  CREAM EXTERNALLY ONCE DAILY 45 g 0  . Multiple Vitamins-Minerals (MULTI FOR HER 50+) TABS Take 1 tablet by mouth daily.    [name] nystatin-triamcinolone ointment (MYCOLOG) Apply 1 application topically 2 (two) times daily. 30 g 0  . potassium chloride (KLOR-CON) 10 MEQ tablet TAKE 1  BY MOUTH ONCE DAILY 30 tablet 0  . senna-docusate (SENOKOT-S) 8.6-50 MG tablet Take 2 tablets by mouth 2 (two) times daily. 20 tablet 0  . sitaGLIPtin (JANUVIA) 100 MG tablet Take 1 tablet (100 mg total) by mouth daily. 90 tablet 3  . traMADol (ULTRAM) 50 MG tablet Take by mouth every 6 (six) hours as needed.     [name] ZINC PICOLINATE PO Take 30 mg by mouth daily.  No current facility-administered medications on file prior to visit.    Allergies  Allergen Reactions  . Metformin Other (See Comments)  . Other Other (See Comments)  . Metformin And Related Itching    Heart palpitation  . Sulfa Antibiotics Itching  . Sulfasalazine Itching    Objective:  General: Alert and oriented x3 in no acute distress  Dermatology: Improving dry scaling plantar surfaces and interspaces on left, melanin spots bilateral. No webspace macerations, no ecchymosis bilateral, all nails x 10 are well manicured with  thickening of bilateral hallux and left 5th toenail.  Vascular: Dorsalis Pedis and Posterior Tibial pedal pulses 2/4, Capillary Fill Time 3 seconds, + pedal hair growth bilateral, no edema bilateral lower extremities, Temperature gradient within normal limits.  Neurology: Johney Maine sensation intact via light touch bilateral.  Musculoskeletal: No pain to palpation bilateral. + Pes planus and minimal hammertoe deformity noted.  Assessment and Plan: Problem List Items Addressed This Visit      Endocrine   Controlled type 2 diabetes mellitus without complication (HCC) (Chronic)    Other Visit Diagnoses    Tinea    -  Primary   Dry skin          -Complete examination performed -Re-Discussed treatment options for slowly improving tinea  -Continue with Mycolog -Continue with Clotrimazole solution  -Advisedgood hygiene habits -Patient to return to office  if fails to improve.   Landis Martins, DPM

## 2020-04-09 NOTE — Telephone Encounter (Signed)
Spoke to pt and relayed Dr. Verda Cumins message. Pt stated understanding and will start losartan when itching has resolved.

## 2020-04-09 NOTE — Telephone Encounter (Signed)
Would recommend stopping Benicar  Make sure itching resolves. If the itching does not improve it may not have been the medication. She had been on this in the past.   Once itching resolves  Start Losartan - sent to pharmacy

## 2020-04-09 NOTE — Addendum Note (Signed)
Addended by: Lesleigh Noe on: 04/09/2020 04:16 PM   Modules accepted: Orders

## 2020-04-27 ENCOUNTER — Other Ambulatory Visit: Payer: Self-pay | Admitting: Hematology & Oncology

## 2020-04-27 DIAGNOSIS — M3119 Other thrombotic microangiopathy: Secondary | ICD-10-CM

## 2020-05-13 ENCOUNTER — Ambulatory Visit: Payer: Federal, State, Local not specified - PPO | Admitting: Podiatry

## 2020-05-14 ENCOUNTER — Ambulatory Visit (HOSPITAL_COMMUNITY)
Admission: RE | Admit: 2020-05-14 | Discharge: 2020-05-14 | Disposition: A | Discharge status: Home / Self Care | Payer: Federal, State, Local not specified - PPO | Source: Ambulatory Visit | Attending: Family Medicine | Admitting: Family Medicine

## 2020-05-14 ENCOUNTER — Other Ambulatory Visit: Payer: Self-pay

## 2020-05-14 ENCOUNTER — Encounter: Payer: Self-pay | Admitting: Family Medicine

## 2020-05-14 ENCOUNTER — Ambulatory Visit: Payer: Federal, State, Local not specified - PPO | Admitting: Family Medicine

## 2020-05-14 VITALS — BP 124/62 | HR 88 | Temp 98.3°F | Ht 61.0 in | Wt 177.8 lb

## 2020-05-14 DIAGNOSIS — M7989 Other specified soft tissue disorders: Secondary | ICD-10-CM

## 2020-05-14 DIAGNOSIS — E782 Mixed hyperlipidemia: Secondary | ICD-10-CM

## 2020-05-14 DIAGNOSIS — E8809 Other disorders of plasma-protein metabolism, not elsewhere classified: Secondary | ICD-10-CM | POA: Diagnosis not present

## 2020-05-14 NOTE — Progress Notes (Addendum)
Subjective:     Vicki [name] is a 60 y.o. female presenting for Leg Swelling (L thigh area and down leg since February )     HPI  #Left leg swelling - feels like the left thigh is larger than the other - symptoms started 1 month ago - ankle, and foot also swollen - did get some sob with jumping rope yesterday - has not tried this better - some pain the back of the calf and foot -   Lab Results  Component Value Date   CHOL 166 09/26/2018   HDL 39 09/26/2018   LDLCALC 115 09/26/2018   TRIG 109 03/03/2019     Review of Systems   Social History   Tobacco Use  Smoking Status Never Smoker  Smokeless Tobacco Never Used        Objective:    BP Readings from Last 3 Encounters:  05/21/20 120/72  05/15/20 122/70  05/14/20 124/62   Wt Readings from Last 3 Encounters:  05/21/20 178 lb (80.7 kg)  05/15/20 179 lb (81.2 kg)  05/14/20 177 lb 12 oz (80.6 kg)    BP 124/62   Pulse 88   Temp 98.3 F (36.8 C) (Temporal)   Ht 5' 1"  (1.549 m)   Wt 177 lb 12 oz (80.6 kg)   LMP 04/30/2012   SpO2 98%   BMI 33.59 kg/m    Physical Exam Constitutional:      General: She is not in acute distress.    Appearance: She is well-developed. She is not diaphoretic.  HENT:     Right Ear: External ear normal.     Left Ear: External ear normal.  Eyes:     Conjunctiva/sclera: Conjunctivae normal.  Cardiovascular:     Rate and Rhythm: Normal rate.  Pulmonary:     Effort: Pulmonary effort is normal.  Musculoskeletal:     Cervical back: Neck supple.     Right lower leg: Edema (trace) present.     Left lower leg: Edema (1+ to knee) present.     Comments: Calf and thigh measured on the left compared to right. Left is 2-4 cm larger in calf and thigh then right  Skin:    General: Skin is warm and dry.     Capillary Refill: Capillary refill takes less than 2 seconds.  Neurological:     Mental Status: She is alert. Mental status is at baseline.  Psychiatric:         Mood and Affect: Mood normal.        Behavior: Behavior normal.           Assessment & Plan:   Problem List Items Addressed This Visit      Hematopoietic and Hemostatic   ADAMTS13 deficiency    Pt with hx of deficiency and TTP with increase risk for blood clots as a result. High risk with unilateral leg swelling today for possible blood clot. Most recent labs in normal range.         Other   Left leg swelling - Primary    Significant difference in LE edema Left > Right in patient with history of TTP. High risk patient with potential for risk for PE if [place] positive. Will get [place] to r/o DVT. Will also discuss with hematologist if possible for treatment guideline. Appreciate Dr. Antonieta Pert support.       Relevant Orders   VAS [place] LOWER EXTREMITY VENOUS (DVT) (Completed)   Hyperlipidemia    Discussed HLD  and DM and need for statin. Pt declined starting statin today.           Return if symptoms worsen or fail to improve.  Lesleigh Noe, MD  This visit occurred during the SARS-CoV-2 public health emergency.  Safety protocols were in place, including screening questions prior to the visit, additional usage of staff PPE, and extensive cleaning of exam room while observing appropriate contact time as indicated for disinfecting solutions.

## 2020-05-14 NOTE — Progress Notes (Signed)
Left lower extremity venous duplex has been completed. Preliminary results can be found in CV Proc through chart review.  Results were given to Dr. Einar Pheasant.  05/14/20 1:16 PM Vicki [name] RVT

## 2020-05-14 NOTE — Assessment & Plan Note (Addendum)
Significant difference in LE edema Left > Right in patient with history of TTP. High risk patient with potential for risk for PE if [place] positive. Will get [place] to r/o DVT. Will also discuss with hematologist if possible for treatment guideline. Appreciate Dr. Antonieta Pert support.

## 2020-05-15 ENCOUNTER — Ambulatory Visit: Payer: Federal, State, Local not specified - PPO | Admitting: Cardiovascular Disease

## 2020-05-15 ENCOUNTER — Encounter: Payer: Self-pay | Admitting: Cardiovascular Disease

## 2020-05-15 VITALS — BP 122/70 | HR 79 | Ht 61.0 in | Wt 179.0 lb

## 2020-05-15 DIAGNOSIS — E119 Type 2 diabetes mellitus without complications: Secondary | ICD-10-CM

## 2020-05-15 DIAGNOSIS — E785 Hyperlipidemia, unspecified: Secondary | ICD-10-CM | POA: Insufficient documentation

## 2020-05-15 DIAGNOSIS — I10 Essential (primary) hypertension: Secondary | ICD-10-CM | POA: Diagnosis not present

## 2020-05-15 DIAGNOSIS — E782 Mixed hyperlipidemia: Secondary | ICD-10-CM | POA: Diagnosis not present

## 2020-05-15 NOTE — Assessment & Plan Note (Signed)
History of palpitations with event monitor performed 01/30/2017 revealing only sinus rhythm/sinus tachycardia without evidence of any arrhythmias.  These have become less frequent and noticeable since limiting her caffeine and alcohol intake.

## 2020-05-15 NOTE — Progress Notes (Signed)
05/15/2020 Vicki [name]   [birth year]  [id number]  Primary Physician Lesleigh Noe, MD Primary Cardiologist: Lorretta Harp MD Vicki [name], [place]  HPI:  Vicki [name] is a 60 y.o.  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 Maudie Mercury for cardiac evaluation because of new onset palpitations. I last saw her in the office  05/08/2019.. Factors include treated hypertension and diabetes. Her mother did have a myocardial infarction at age 64. She has never had a heart attack or stroke and denies chest pain or shortness of breath. She is in her perimenopause time window is followed by Dr. Dellis Filbert. She drinks 2 cups of caffeinated beverages a day as well as alcohol on the weekends. She is not under a lot of stress recently. A 2-D echocardiogram was normal and an event monitor showed sinus rhythm sinus/sinus tachycardia and frequent PVCs. Shehad decreased her caffeine intake which improved her symptoms a year ago. However recently, she's noticed increased heart rate and palpitations are somewhat different quality than her previous symptoms. 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  She is drinking less caffeine and alcohol since I saw her last her palpitations are much less frequent and noticeable.  She had some atypical chest pain when they removed her indwelling catheter but none since.  Since I saw her a year ago she is done well.  Her palpitations are less noticeable and and or frequent.  She gets occasional atypical chest pain.  She has noticed some swelling in her left leg and had a negative venous Doppler study performed yesterday.    Current Meds  Medication Sig  . acetaminophen (TYLENOL) 325 MG tablet Take 650 mg by mouth every 6 (six) hours as needed.  . Alum Hydroxide-Mag Carbonate (GAVISCON EXTRA STRENGTH PO) Take 1 tablet by mouth 4  (four) times daily as needed.  . Ascorbic Acid (VITAMIN C) 500 MG CAPS Take by mouth daily.  . cholecalciferol (VITAMIN D3) 25 MCG (1000 UT) tablet Take 1,000 Units by mouth daily.  . clotrimazole (LOTRIMIN) 1 % external solution Apply 1 application topically at bedtime. In between toes and to toenails at bedtime  . dextromethorphan-guaiFENesin (MUCINEX DM) 30-600 MG 12hr tablet Take 2 tablets by mouth 2 (two) times daily as needed for cough.  . fexofenadine (ALLEGRA) 180 MG tablet Take 180 mg by mouth daily as needed for allergies or rhinitis.  . folic acid (FOLVITE) 1 MG tablet Take 2 tablets by mouth once daily  . glucose blood test strip Use as instructed  . Homeopathic Products (LIVER SUPPORT SL) Take by mouth in the morning and at bedtime.  [name] losartan (COZAAR) 50 MG tablet Take 1 tablet (50 mg total) by mouth daily.  [name] MAGNESIUM GLYCINATE PO Take 200 mg by mouth daily.  . mometasone (ELOCON) 0.1 % cream APPLY  CREAM EXTERNALLY ONCE DAILY  . Multiple Vitamins-Minerals (MULTI FOR HER 50+) TABS Take 1 tablet by mouth daily.  [name] nystatin-triamcinolone ointment (MYCOLOG) Apply 1 application topically 2 (two) times daily.  . potassium chloride (KLOR-CON) 10 MEQ tablet TAKE 1  BY MOUTH ONCE DAILY  . senna-docusate (SENOKOT-S) 8.6-50 MG tablet Take 2 tablets by mouth 2 (two) times daily.  . sitaGLIPtin (JANUVIA) 100 MG tablet Take 1 tablet (100 mg total) by mouth daily.  . traMADol (ULTRAM) 50 MG tablet Take by mouth every 6 (  six) hours as needed.   [name] ZINC PICOLINATE PO Take 30 mg by mouth daily.     Allergies  Allergen Reactions  . Metformin Other (See Comments)  . Other Other (See Comments)  . Metformin And Related Itching    Heart palpitation  . Sulfa Antibiotics Itching  . Sulfasalazine Itching    Social History   Socioeconomic History  . Marital status: Single    Spouse name: Not on file  . Number of children: 1  . Years of education: Not on file  . Highest education level:  Some college, no degree  Occupational History    Employer: [employer]  Tobacco Use  . Smoking status: Never Smoker  . Smokeless tobacco: Never Used  Vaping Use  . Vaping Use: Never used  Substance and Sexual Activity  . Alcohol use: Yes    Alcohol/week: 3.0 standard drinks    Types: 3 Standard drinks or equivalent per week    Comment: trying to reduce  . Drug use: No  . Sexual activity: Not Currently    Birth control/protection: Condom  Other Topics Concern  . 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      Family: Daughter - Hospital doctor - good relationship      Enjoys: watch TV and read      Exercise: dancing, on her own   Diet: does not follow diabetic diet, not eating as much      Safety   Seat belts: Yes    Guns: No   Safe in relationships: Yes        Social Determinants of Radio broadcast assistant Strain: Not on file  Food Insecurity: Not on file  Transportation Needs: Not on file  Physical Activity: Not on file  Stress: Not on file  Social Connections: Not on file  Intimate Partner Violence: Not on file     Review of Systems: General: negative for chills, fever, night sweats or weight changes.  Cardiovascular: negative for chest pain, dyspnea on exertion, edema, orthopnea, palpitations, paroxysmal nocturnal dyspnea or shortness of breath Dermatological: negative for rash Respiratory: negative for cough or wheezing Urologic: negative for hematuria Abdominal: negative for nausea, vomiting, diarrhea, bright red blood per rectum, melena, or hematemesis Neurologic: negative for visual changes, syncope, or dizziness All other systems reviewed and are otherwise negative except as noted above.    Blood pressure 122/70, pulse 79, height 5' 1"  (1.549 m), weight 179 lb (81.2 kg), last menstrual period 04/30/2012.  General appearance: alert and no  distress Neck: no adenopathy, no carotid bruit, no JVD, supple, symmetrical, trachea midline and thyroid not enlarged, symmetric, no tenderness/mass/nodules Lungs: clear to auscultation bilaterally Heart: regular rate and rhythm, S1, S2 normal, no murmur, click, rub or gallop Extremities: extremities normal, atraumatic, no cyanosis or edema Pulses: 2+ and symmetric Skin: Skin color, texture, turgor normal. No rashes or lesions Neurologic: Alert and oriented X 3, normal strength and tone. Normal symmetric reflexes. Normal coordination and gait  EKG sinus rhythm at 79 with left axis deviation and poor R wave progression.  I personally reviewed this EKG.  ASSESSMENT AND PLAN:   Palpitations History of palpitations with event monitor performed 01/30/2017 revealing only sinus rhythm/sinus tachycardia without evidence of any arrhythmias.  These have become less frequent and noticeable since limiting her caffeine and alcohol  intake.  Hyperlipidemia History of hyperlipidemia with lipid profile performed 09/26/2018 revealing total cholesterol 166, LDL of 122 and HDL 57.  Am going to get a coronary calcium score to help determine how aggressive to be with lipid management.      Lorretta Harp MD FACP,FACC,FAHA, [facility] 05/15/2020 3:39 PM

## 2020-05-15 NOTE — Patient Instructions (Signed)
Medication Instructions:  Your physician recommends that you continue on your current medications as directed. Please refer to the Current Medication list given to you today.  *If you need a refill on your cardiac medications before your next appointment, please call your pharmacy*  Testing/Procedures: Dr. Gwenlyn Found has ordered a CT coronary calcium score. This test is done at [address]. This is $99 out of pocket.   Coronary CalciumScan A coronary calcium scan is an imaging test used to look for deposits of calcium and other fatty materials (plaques) in the inner lining of the blood vessels of the heart (coronary arteries). These deposits of calcium and plaques can partly clog and narrow the coronary arteries without producing any symptoms or warning signs. This puts a person at risk for a heart attack. This test can detect these deposits before symptoms develop. Tell a health care provider about:  Any allergies you have.  All medicines you are taking, including vitamins, herbs, eye drops, creams, and over-the-counter medicines.  Any problems you or family members have had with anesthetic medicines.  Any blood disorders you have.  Any surgeries you have had.  Any medical conditions you have.  Whether you are pregnant or may be pregnant. What are the risks? Generally, this is a safe procedure. However, problems may occur, including:  Harm to a pregnant woman and her unborn baby. This test involves the use of radiation. Radiation exposure can be dangerous to a pregnant woman and her unborn baby. If you are pregnant, you generally should not have this procedure done.  Slight increase in the risk of cancer. This is because of the radiation involved in the test. What happens before the procedure? No preparation is needed for this procedure. What happens during the procedure?  You will undress and remove any jewelry around your neck or chest.  You will put on a  hospital gown.  Sticky electrodes will be placed on your chest. The electrodes will be connected to an electrocardiogram (ECG) machine to record a tracing of the electrical activity of your heart.  A CT scanner will take pictures of your heart. During this time, you will be asked to lie still and hold your breath for 2-3 seconds while a picture of your heart is being taken. The procedure may vary among health care providers and hospitals. What happens after the procedure?  You can get dressed.  You can return to your normal activities.  It is up to you to get the results of your test. Ask your health care provider, or the department that is doing the test, when your results will be ready. Summary  A coronary calcium scan is an imaging test used to look for deposits of calcium and other fatty materials (plaques) in the inner lining of the blood vessels of the heart (coronary arteries).  Generally, this is a safe procedure. Tell your health care provider if you are pregnant or may be pregnant.  No preparation is needed for this procedure.  A CT scanner will take pictures of your heart.  You can return to your normal activities after the scan is done. This information is not intended to replace advice given to you by your health care provider. Make sure you discuss any questions you have with your health care provider. Document Released: 08/20/2007 Document Revised: 01/11/2016 Document Reviewed: 01/11/2016 Elsevier Interactive Patient Education  2017 [employer].     Follow-Up: At [facility], you and your health needs are  our priority.  As part of our continuing mission to provide you with exceptional heart care, we have created designated Provider Care Teams.  These Care Teams include your primary Cardiologist (physician) and Advanced Practice Providers (APPs -  Physician Assistants and Nurse Practitioners) who all work together to provide you with the care you need, when you need  it.  We recommend signing up for the patient portal called "MyChart".  Sign up information is provided on this After Visit Summary.  MyChart is used to connect with patients for Virtual Visits (Telemedicine).  Patients are able to view lab/test results, encounter notes, upcoming appointments, etc.  Non-urgent messages can be sent to your provider as well.   To learn more about what you can do with MyChart, go to [email or web address].    Your next appointment:   12 month(s)  The format for your next appointment:   In Person  Provider:   Quay Burow, MD

## 2020-05-15 NOTE — Assessment & Plan Note (Signed)
History of hyperlipidemia with lipid profile performed 09/26/2018 revealing total cholesterol 166, LDL of 122 and HDL 57.  Am going to get a coronary calcium score to help determine how aggressive to be with lipid management.

## 2020-05-19 ENCOUNTER — Telehealth: Payer: Self-pay

## 2020-05-19 NOTE — Telephone Encounter (Signed)
Pt left v/m that she has a boil or cyst in vaginal area and request abx or does pt need appt.  I spoke with pt and the boil or cyst is white and not draining and is very painful. Started 3 days ago. Pt said she will call dermatology who saw her for this type thing before to see if they can see pt on 05/20/20. Pt will cb if needed and UC & ED precautions given.sending note to DR Einar Pheasant.

## 2020-05-20 NOTE — Telephone Encounter (Signed)
Agree with Dermatology and f/u here if needed

## 2020-05-21 ENCOUNTER — Telehealth: Payer: Self-pay

## 2020-05-21 ENCOUNTER — Other Ambulatory Visit: Payer: Self-pay

## 2020-05-21 ENCOUNTER — Encounter: Payer: Self-pay | Admitting: Nurse Practitioner

## 2020-05-21 ENCOUNTER — Ambulatory Visit: Payer: Federal, State, Local not specified - PPO | Admitting: Nurse Practitioner

## 2020-05-21 VITALS — BP 120/72 | HR 68 | Temp 98.1°F | Resp 16 | Wt 178.0 lb

## 2020-05-21 DIAGNOSIS — R350 Frequency of micturition: Secondary | ICD-10-CM | POA: Diagnosis not present

## 2020-05-21 DIAGNOSIS — L02224 Furuncle of groin: Secondary | ICD-10-CM

## 2020-05-21 DIAGNOSIS — N952 Postmenopausal atrophic vaginitis: Secondary | ICD-10-CM | POA: Diagnosis not present

## 2020-05-21 LAB — URINALYSIS, COMPLETE W/RFL CULTURE
Bacteria, UA: NONE SEEN /HPF
Bilirubin Urine: NEGATIVE
Hgb urine dipstick: NEGATIVE
Hyaline Cast: NONE SEEN /LPF
Ketones, ur: NEGATIVE
Leukocyte Esterase: NEGATIVE
Nitrites, Initial: NEGATIVE
Protein, ur: NEGATIVE
RBC / HPF: NONE SEEN /HPF (ref 0–2)
Specific Gravity, Urine: 1.02 (ref 1.001–1.03)
WBC, UA: NONE SEEN /HPF (ref 0–5)
pH: 7 (ref 5.0–8.0)

## 2020-05-21 LAB — WET PREP FOR TRICH, YEAST, CLUE

## 2020-05-21 LAB — NO CULTURE INDICATED

## 2020-05-21 MED ORDER — CEPHALEXIN 500 MG PO CAPS
ORAL_CAPSULE | ORAL | 0 refills | Status: DC
Start: 2020-05-21 — End: 2020-06-17

## 2020-05-21 MED ORDER — ESTRADIOL 0.1 MG/GM VA CREA: TOPICAL_CREAM | VAGINAL | 6 refills | Status: DC

## 2020-05-21 MED ORDER — CEPHALEXIN 500 MG PO CAPS: 500.0000 mg | ORAL_CAPSULE | Freq: Two times a day (BID) | ORAL | 0 refills | Status: DC

## 2020-05-21 NOTE — Addendum Note (Signed)
Addended by: Donivan Scull on: 05/21/2020 04:57 PM   Modules accepted: Orders

## 2020-05-21 NOTE — Progress Notes (Signed)
GYNECOLOGY  VISIT  CC:   Vaginal irritation and vulvar abscess  HPI: 60 y.o. G1P0011 Single Black or African American female here for vaginal burning, odor, urinary frequency & vaginal bump.  Has two separate issues today:  1. Has noticed vaginal burning x 2 weeks, intermittent. Hurts worse with washing. Tried vagisil and that burns. Not sexually active x 1 year with boyfriend because has pain.  Urinary frequency and burning (usually, but) has increased in intensity since vaginal irritation started.  2. Two days ago noticed a skin abscess, painful. It ruptured today and drained pus and feels a little better since it ruptured.   GYNECOLOGIC HISTORY: Patient's last menstrual period was 04/30/2012. Contraception: post menopausal Menopausal hormone therapy: none  Patient Active Problem List   Diagnosis Date Noted  . Hyperlipidemia 05/15/2020  . Left leg swelling 05/14/2020  . Radial styloid tenosynovitis (de quervain) 04/06/2020  . Essential hypertension 02/18/2020  . Vitamin B12 deficiency 02/18/2020  . Blurred vision 02/18/2020  . Other fatigue 11/06/2019  . Hair loss 08/29/2019  . Normocytic anemia 08/15/2019  . Cellulitis 05/16/2019  . Cardiac murmur 05/08/2019  . Blood glucose elevated 04/23/2019  . Boil of groin 04/23/2019  . 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 Insomnia 02/26/2019  . Acquired pancytopenia ([place]) 11/01/2018  . Liver cirrhosis secondary to NASH ([place]) 04/25/2018  . 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 Sinus drainage 01/23/2018  . Spinal stenosis of lumbar region 01/17/2018  . Nonspecific pain in the lumbar region 12/21/2017  . Allergic conjunctivitis 06/05/2017  . 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 Palpitations 04/15/2015  . Controlled type 2 diabetes mellitus without complication ([place]) 63/33/5456  . Elevated liver enzymes 03/31/2014  . Tinea pedis 07/31/2013  . Foot and toe(s), blister, infected  07/31/2013  . 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 Pain in joint, ankle and foot 08/31/2012    Past Medical History:  Diagnosis Date  . Allergy   . Arthritis   . Boil   . Cancer ([place])    throbotic thrombocytopenic purpura  . 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 Diabetes ([place])    type 2  . Elevated liver enzymes   . Fatty liver   . Hidradenitis   . Hyperlipemia   . Hypertension   . 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 PVC's (premature ventricular contractions)   . 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 BREAST CYST ASPIRATION Left   . COLONOSCOPY  2014  . ECTOPIC PREGNANCY SURGERY  1990  . IR FLUORO GUIDE CV LINE RIGHT  03/05/2019  . IR FLUORO GUIDE CV LINE RIGHT  03/15/2019  . IR FLUORO GUIDE CV LINE RIGHT  05/17/2019  . IR FLUORO GUIDE CV LINE RIGHT  05/23/2019  . IR REMOVAL TUN CV CATH W/O FL  04/12/2019  . IR REMOVAL TUN CV CATH W/O FL  08/06/2019  . IR US GUIDE VASC ACCESS RIGHT  03/05/2019  . IR US GUIDE VASC ACCESS RIGHT  03/15/2019  . IR US GUIDE VASC ACCESS RIGHT  05/17/2019  . TONSILLECTOMY  1980  . UPPER GI ENDOSCOPY  08/2017   [facility]    MEDS:   Current Outpatient Medications on File Prior to Visit  Medication Sig Dispense Refill  . acetaminophen (TYLENOL) 325 MG tablet Take 650 mg by mouth every 6 (six) hours as needed.    . Alum Hydroxide-Mag Carbonate (  GAVISCON EXTRA STRENGTH PO) Take 1 tablet by mouth 4 (four) times daily as needed.    . Ascorbic Acid (VITAMIN C) 500 MG CAPS Take by mouth daily.    . cholecalciferol (VITAMIN D3) 25 MCG (1000 UT) tablet Take 1,000 Units by mouth daily.    . fexofenadine (ALLEGRA) 180 MG tablet Take 180 mg by mouth daily as needed for allergies or rhinitis.    . folic acid (FOLVITE) 1 MG tablet Take 2 tablets by mouth once daily 60 tablet 0  . glucose blood test strip Use as instructed 100 each 12  .  MAGNESIUM GLYCINATE PO Take 200 mg by mouth daily.    . Multiple Vitamins-Minerals (MULTI FOR HER 50+) TABS Take 1 tablet by mouth daily.    [name] nystatin-triamcinolone ointment (MYCOLOG) Apply 1 application topically 2 (two) times daily. 30 g 0  . potassium chloride (KLOR-CON) 10 MEQ tablet TAKE 1  BY MOUTH ONCE DAILY 30 tablet 0  . senna-docusate (SENOKOT-S) 8.6-50 MG tablet Take 2 tablets by mouth 2 (two) times daily. 20 tablet 0  . sitaGLIPtin (JANUVIA) 100 MG tablet Take 1 tablet (100 mg total) by mouth daily. 90 tablet 3  . ZINC PICOLINATE PO Take 30 mg by mouth daily.     No current facility-administered medications on file prior to visit.    ALLERGIES: Metformin, Other, Metformin and related, Sulfa antibiotics, and Sulfasalazine  Family History  Problem Relation Age of Onset  . Hypertension Mother   . Diabetes Mother   . Heart attack Mother 80  . Asthma Mother        Childhood  . Arthritis Mother   . Heart disease Mother   . Hyperlipidemia Mother   . Hypertension Sister   . Diabetes Sister   . Asthma Sister 40       asthma attack cause of death  . Diabetes Brother   . Hypertension Brother   . Diabetes Brother   . Hypertension Brother   . Hypertension Maternal Grandmother   . Heart attack Maternal Grandfather 60  . Colon cancer Neg Hx      Review of Systems  Constitutional: Negative.   HENT: Negative.   Eyes: Negative.   Respiratory: Negative.   Cardiovascular: Negative.   Gastrointestinal: Negative.   Endocrine: Negative.   Genitourinary: Positive for frequency.       Vaginal burning,odor, vaginal bump  Musculoskeletal: Negative.   Skin: Negative.   Allergic/Immunologic: Negative.   Neurological: Negative.   Hematological: Negative.   Psychiatric/Behavioral: Negative.     PHYSICAL EXAMINATION:    BP 120/72   Pulse 68   Temp 98.1 F (36.7 C) (Oral)   Resp 16   Wt 178 lb (80.7 kg)   LMP 04/30/2012   BMI 33.63 kg/m     General appearance: alert,  cooperative, no acute distress  Pelvic: External genitalia: boil noted, deep indurated tissue the size of a dime, very firm, pustule              Urethra:  normal appearing urethra with no masses, tenderness or lesions              Bartholins and Skenes: normal                 Vagina: thin, fragile, friable, red tissue, minimal discharge              Cervix: no lesions   Wet mount: neg trich, neg yeast, neg clue, neg whiff, few bacterial,  few WBC, ph 5.5              Urinalysis: WNL  Chaperone, Joy, CMA, was present for exam.  Assessment/Plan:  Urinary frequency - Plan: Urinalysis,Complete w/RFL Culture  Boil of groin - Plan: cephALEXin (KEFLEX) 500 MG capsule  Atrophic vaginitis - Plan: WET PREP FOR TRICH, YEAST, CLUE, estradiol (ESTRACE) 0.1 MG/GM vaginal cream  30 min spent: reviewing chart, listening to history, physical exam, counseling and education and documentation.

## 2020-05-21 NOTE — Telephone Encounter (Signed)
Pharmacy called regarding Kelfex 500 mg Rx just received.  There are two sets of directions. She said It reads to "take one caps twice daily" but the next sentence said "Take QID for 7 days".  I am happy to correct Rx in the system and resend it. Please advise correct directions. Thanks

## 2020-05-21 NOTE — Patient Instructions (Signed)
Genitourinary Syndrome of Menopause  Genitourinary Syndrome of menopause is a term that describes the spectrum of changes caused by the lack of estrogen in menopause. Common Signs and Symptoms Include: Vaginal dryness, irritation/burning/itching. Abnormal discharge, vaginal pressure, pain with sex, decreased sexual arousal/desire, difficulty achieving orgasm, pain with urination, urgency, incontinence of urine, recurrent bladder infections, urethral prolapse and others. Diagnosis can usually be made with history and pelvic exam. Other testing may be considered to rule out other potential abnormalities. Symptoms can be progressive and chronic. The goal of treatment is symptom relief.  There are several different approaches to improving symptoms:  Vaginal Moisturizers and lubricants: Glycerin Moisturizer (Replens), Silicone based products (Uberlube), water-based products (Restore Moisturizing Gel by [employer]), Hyaluronic Acid vaginal products (such as HYALO GYN), and natural oils such as Olive Oil, Almond Oil and Coconut Oil (Since coconut oil comes in solid preparations, you can form them into suppositories and insert into the vagina as needed). These products are Over-the-Counter (OTC) at [employer] and retail stores and DO NOT contain hormones.  How to use vaginal and vulvar moisturizers . Many vaginal moisturizers come with an applicator. You will need fill the applicator with the moisturizer and then insert it carefully into your vagina. You can put lubricant on the tip of the applicator to make it easier to insert into your vagina. . You can also use vaginal moisturizers on your vulvar tissues, including your inner and outer labia (the folds of skin around your vagina). To put these moisturizers on your vulva, put a small amount (pea or grape size) of moisturizer on your finger. Then, massage the moisturizer into your vaginal opening and onto your labia. . If you recently finished cancer  treatment, or are going through sudden menopause, you may need to use the moisturizers 3 to 5 times a week to relieve your symptoms. . Vaginal and vulvar moisturizers should be used before you go to bed, so the product can be fully absorbed.  Lifestyle modifications: smoking cessation, pelvic floor physical therapy, Kegel's exercises, vaginal dilators  Non-hormonal therapies: Lidocaine (topical anesthetic) to decrease sensitivity, Oral Ospemifeme (requires prescription), Laser therapy (Pros and Cons should be discussed with provider)  Hormones Therapies: For moderate to severe GSM, vaginal estrogen is considered the most effective treatment. It can be used in combination with vaginal moisturizers and lubricants. Estrogen is delivered in small doses in the vagina with various preparations available including creams, rings, and tablets. Estrogen therapy may be contraindicated with certain hormone sensitive cancers. Vaginal DHEA and Testosterone have shown effectiveness in some cases to help with relieving vaginal atrophy and have shown mixed results with libido. Risks and benefits should be discussed prior to starting therapy.

## 2020-05-21 NOTE — Telephone Encounter (Signed)
Ugh!  Keflex always gets me. The saved default is QID. I changed this rx a couple times. I will never rx QID.Marland KitchenMarland Kitchen I just need to make sure I look more closely at the directions. I have corrected this one. Thanks

## 2020-05-28 DIAGNOSIS — E8809 Other disorders of plasma-protein metabolism, not elsewhere classified: Secondary | ICD-10-CM | POA: Insufficient documentation

## 2020-05-28 NOTE — Assessment & Plan Note (Signed)
Discussed HLD and DM and need for statin. Pt declined starting statin today.

## 2020-05-28 NOTE — Assessment & Plan Note (Addendum)
Pt with hx of deficiency and TTP with increase risk for blood clots as a result. High risk with unilateral leg swelling today for possible blood clot. Most recent labs in normal range.

## 2020-05-29 ENCOUNTER — Encounter: Payer: Self-pay | Admitting: Family Medicine

## 2020-06-01 ENCOUNTER — Other Ambulatory Visit: Payer: Self-pay | Admitting: Hematology & Oncology

## 2020-06-01 DIAGNOSIS — M3119 Other thrombotic microangiopathy: Secondary | ICD-10-CM

## 2020-06-05 ENCOUNTER — Ambulatory Visit (HOSPITAL_COMMUNITY)
Admission: EM | Admit: 2020-06-05 | Discharge: 2020-06-05 | Disposition: A | Discharge status: Home / Self Care | Payer: Federal, State, Local not specified - PPO | Attending: Emergency Medicine | Admitting: Emergency Medicine

## 2020-06-05 ENCOUNTER — Other Ambulatory Visit: Payer: Self-pay

## 2020-06-05 ENCOUNTER — Encounter (HOSPITAL_COMMUNITY): Payer: Self-pay

## 2020-06-05 DIAGNOSIS — R0981 Nasal congestion: Secondary | ICD-10-CM | POA: Diagnosis not present

## 2020-06-05 DIAGNOSIS — B349 Viral infection, unspecified: Secondary | ICD-10-CM | POA: Diagnosis not present

## 2020-06-05 DIAGNOSIS — R509 Fever, unspecified: Secondary | ICD-10-CM | POA: Diagnosis not present

## 2020-06-05 DIAGNOSIS — R300 Dysuria: Secondary | ICD-10-CM

## 2020-06-05 LAB — BASIC METABOLIC PANEL
Anion gap: 5 (ref 5–15)
BUN: 6 mg/dL (ref 6–20)
CO2: 28 mmol/L (ref 22–32)
Calcium: 9.9 mg/dL (ref 8.9–10.3)
Chloride: 102 mmol/L (ref 98–111)
Creatinine, Ser: 0.94 mg/dL (ref 0.44–1.00)
GFR, Estimated: >60 mL/min (ref >60)
Glucose, Bld: 116 mg/dL — ABNORMAL HIGH (ref 70–99)
Potassium: 4.1 mmol/L (ref 3.5–5.1)
Sodium: 135 mmol/L (ref 135–145)

## 2020-06-05 LAB — POCT URINALYSIS DIPSTICK, ED / UC
Bilirubin Urine: NEGATIVE
Glucose, UA: NEGATIVE mg/dL
Hgb urine dipstick: NEGATIVE
Ketones, ur: NEGATIVE mg/dL
Leukocytes,Ua: NEGATIVE
Nitrite: NEGATIVE
Protein, ur: NEGATIVE mg/dL
Specific Gravity, Urine: 1.015 (ref 1.005–1.030)
Urobilinogen, UA: 0.2 mg/dL (ref 0.0–1.0)
pH: 7.5 (ref 5.0–8.0)

## 2020-06-05 LAB — CBC
HCT: 38.6 % (ref 36.0–46.0)
Hemoglobin: 13.1 g/dL (ref 12.0–15.0)
MCH: 31 pg (ref 26.0–34.0)
MCHC: 33.9 g/dL (ref 30.0–36.0)
MCV: 91.3 fL (ref 80.0–100.0)
Platelets: 112 10*3/uL — ABNORMAL LOW (ref 150–400)
RBC: 4.23 MIL/uL (ref 3.87–5.11)
RDW: 13.2 % (ref 11.5–15.5)
WBC: 4.7 10*3/uL (ref 4.0–10.5)
nRBC: 0 % (ref 0.0–0.2)

## 2020-06-05 NOTE — Discharge Instructions (Signed)
You most likely have viral illness   Continue taking your Allegra daily.  I would recommend taking Flonase 2 sprays daily.  You may also use Ibuprofen as needed for fever reduction and pain relief.   Drink lots of fluids and rest.   Return or go to the Emergency Department if symptoms worsen or do not improve in the next few days.

## 2020-06-05 NOTE — ED Provider Notes (Signed)
[place]    CSN: [id number] Arrival date & time: 06/05/20  1238      History   Chief Complaint Chief Complaint  Patient presents with  . Nasal Congestion    Fever    . Fever    HPI Vicki [name] is a 60 y.o. female.   Patient is here with chief complaint nasal congestion, dysuria, and fever for 2 days.  Reports taking OTC medications which helped with fever.  Has history of low blood counts.  Reports history of seasonal allergies for which she takes Allegra daily. Denies any chest pain, shortness of breath, N/V/D, numbness, tingling, weakness, abdominal pain, or headaches.   ROS: As per HPI, all other pertinent ROS negative   The history is provided by the patient.  Fever   Past Medical History:  Diagnosis Date  . Allergy   . Arthritis   . Boil   . 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 Diabetes ([place])    type 2  . Elevated liver enzymes   . Fatty liver   . Hidradenitis   . Hyperlipemia   . Hypertension   . 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 PVC's (premature ventricular contractions)   . 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 Hyperlipidemia 05/15/2020  . Left leg swelling 05/14/2020  . Radial styloid tenosynovitis (de quervain) 04/06/2020  . Essential hypertension 02/18/2020  . Vitamin B12 deficiency 02/18/2020  . Blurred vision 02/18/2020  . Other fatigue 11/06/2019  . Hair loss 08/29/2019  . Normocytic anemia 08/15/2019  . Cellulitis 05/16/2019  . Cardiac murmur 05/08/2019  . Blood glucose elevated 04/23/2019  . Boil of groin 04/23/2019  . 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 Insomnia 02/26/2019  . Acquired pancytopenia ([place]) 11/01/2018  . Liver cirrhosis secondary to NASH ([place]) 04/25/2018  . Classical migraine with  intractable migraine 04/09/2018  . Sinus drainage 01/23/2018  . Spinal stenosis of lumbar region 01/17/2018  . Nonspecific pain in the lumbar region 12/21/2017  . Allergic conjunctivitis 06/05/2017  . 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 Palpitations 04/15/2015  . Controlled type 2 diabetes mellitus without complication ([place]) 91/63/8466  . Elevated liver enzymes 03/31/2014  . Tinea pedis 07/31/2013  . Foot and toe(s), blister, infected 07/31/2013  . 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 Pain in joint, ankle and foot 08/31/2012    Past Surgical History:  Procedure Laterality Date  . BREAST CYST ASPIRATION Left   . COLONOSCOPY  2014  . ECTOPIC PREGNANCY SURGERY  1990  . IR FLUORO GUIDE CV LINE RIGHT  03/05/2019  . IR FLUORO GUIDE CV LINE RIGHT  03/15/2019  . IR FLUORO GUIDE CV LINE RIGHT  05/17/2019  . IR FLUORO GUIDE CV LINE RIGHT  05/23/2019  . IR REMOVAL TUN CV CATH W/O FL  04/12/2019  . IR REMOVAL TUN CV CATH W/O FL  08/06/2019  . IR US GUIDE VASC ACCESS RIGHT  03/05/2019  . IR US GUIDE VASC ACCESS RIGHT  03/15/2019  . IR US GUIDE VASC ACCESS RIGHT  05/17/2019  . TONSILLECTOMY  1980  . UPPER GI ENDOSCOPY  08/2017   [facility]    OB History    Gravida  1   Para  0   Term  0   Preterm  0  AB  1   Living  1     SAB  0   IAB  0   Ectopic  1   Multiple  0   Live Births  0            Home Medications    Prior to Admission medications   Medication Sig Start Date End Date Taking? Authorizing Provider  acetaminophen (TYLENOL) 325 MG tablet Take 650 mg by mouth every 6 (six) hours as needed.    [provider]  Alum Hydroxide-Mag Carbonate (GAVISCON EXTRA STRENGTH PO) Take 1 tablet by mouth 4 (four) times daily as needed. 03/26/19   [provider]  Ascorbic Acid (VITAMIN C) 500 MG CAPS Take by mouth daily.    [provider]  cephALEXin (KEFLEX) 500 MG capsule Take bid x 7 days 05/21/20   Karma Ganja, NP   cholecalciferol (VITAMIN D3) 25 MCG (1000 UT) tablet Take 1,000 Units by mouth daily.    [provider]  estradiol (ESTRACE) 0.1 MG/GM vaginal cream 1 gram vaginally twice weekly 05/21/20   Karma Ganja, NP  fexofenadine (ALLEGRA) 180 MG tablet Take 180 mg by mouth daily as needed for allergies or rhinitis.    [provider]  folic acid (FOLVITE) 1 MG tablet Take 2 tablets by mouth once daily 06/01/20   Volanda Napoleon, MD  glucose blood test strip Use as instructed 01/17/20   Elby Beck, FNP  MAGNESIUM GLYCINATE PO Take 200 mg by mouth daily.    [provider]  Multiple Vitamins-Minerals (MULTI FOR HER 50+) TABS Take 1 tablet by mouth daily.    [provider]  nystatin-triamcinolone ointment (MYCOLOG) Apply 1 application topically 2 (two) times daily. 01/02/20   Landis Martins, DPM  potassium chloride (KLOR-CON) 10 MEQ tablet TAKE 1  BY MOUTH ONCE DAILY 06/01/20   Volanda Napoleon, MD  senna-docusate (SENOKOT-S) 8.6-50 MG tablet Take 2 tablets by mouth 2 (two) times daily. 03/15/19   Swayze, Ava, DO  sitaGLIPtin (JANUVIA) 100 MG tablet Take 1 tablet (100 mg total) by mouth daily. 08/15/19   Lesleigh Noe, MD  ZINC PICOLINATE PO Take 30 mg by mouth daily.    [provider]    Family History Family History  Problem Relation Age of Onset  . Hypertension Mother   . Diabetes Mother   . Heart attack Mother 99  . Asthma Mother        Childhood  . Arthritis Mother   . Heart disease Mother   . Hyperlipidemia Mother   . Hypertension Sister   . Diabetes Sister   . Asthma Sister 40       asthma attack cause of death  . Diabetes Brother   . Hypertension Brother   . Diabetes Brother   . Hypertension Brother   . Hypertension Maternal Grandmother   . Heart attack Maternal Grandfather 60  . Colon cancer Neg Hx     Social History Social History   Tobacco Use  . Smoking status: Never Smoker  . Smokeless tobacco: Never Used  Vaping  Use  . Vaping Use: Never used  Substance Use Topics  . Alcohol use: Yes    Comment: 2 a month  . Drug use: No     Allergies   Metformin, Other, Metformin and related, Sulfa antibiotics, and Sulfasalazine   Review of Systems Review of Systems  Constitutional: Positive for fever.     Physical Exam Triage Vital Signs ED  Triage Vitals  Enc Vitals Group     BP 06/05/20 1425 (!) 161/89     Pulse Rate 06/05/20 1425 88     Resp 06/05/20 1425 18     Temp 06/05/20 1425 98.6 F (37 C)     Temp Source 06/05/20 1425 Oral     SpO2 06/05/20 1425 97 %     Weight --      Height --      Head Circumference --      Peak Flow --      Pain Score 06/05/20 1423 0     Pain Loc --      Pain Edu? --      Excl. in [place]? --    No data found.  Updated Vital Signs BP (!) 161/89 (BP Location: Left Arm)   Pulse 88   Temp 98.6 F (37 C) (Oral)   Resp 18   LMP 04/30/2012   SpO2 97%   Visual Acuity Right Eye Distance:   Left Eye Distance:   Bilateral Distance:    Right Eye Near:   Left Eye Near:    Bilateral Near:     Physical Exam Vitals and nursing note reviewed.  Constitutional:      General: She is not in acute distress.    Appearance: Normal appearance. She is not ill-appearing, toxic-appearing or diaphoretic.  HENT:     Head: Normocephalic and atraumatic.  Eyes:     Conjunctiva/sclera: Conjunctivae normal.  Cardiovascular:     Rate and Rhythm: Normal rate and regular rhythm.     Pulses: Normal pulses.     Heart sounds: Normal heart sounds.  Pulmonary:     Effort: Pulmonary effort is normal.     Breath sounds: Normal breath sounds.  Abdominal:     General: Abdomen is flat.  Musculoskeletal:        General: Normal range of motion.     Cervical back: Normal range of motion.  Skin:    General: Skin is warm and dry.  Neurological:     General: No focal deficit present.     Mental Status: She is alert and oriented to person, place, and time.  Psychiatric:        Mood  and Affect: Mood normal.      UC Treatments / Results  Labs (all labs ordered are listed, but only abnormal results are displayed) Labs Reviewed  CBC  BASIC METABOLIC PANEL  POCT URINALYSIS DIPSTICK, ED / UC    EKG   Radiology No results found.  Procedures Procedures (including critical care time)  Medications Ordered in UC Medications - No data to display  Initial Impression / Assessment and Plan / UC Course  I have reviewed the triage vital signs and the nursing notes.  Pertinent labs & imaging results that were available during my care of the patient were reviewed by me and considered in my medical decision making (see chart for details).     Viral Illness, Dysuria, Fever Patient reports being concerned about fever due to history of low blood counts.  Will check CBC and BMP. Urinalysis negative for signs of infection. Continue taking Allegra for allergies.  Consider adding in Flonase daily. Ibuprofen as needed for fever reduction and pain relief.  Rest and drink lots of fluids. Follow-up with PCP as needed  Final Clinical Impressions(s) / UC Diagnoses   Final diagnoses:  Viral illness  Dysuria  Fever, unspecified fever cause     Discharge Instructions  You most likely have viral illness   Continue taking your Allegra daily.  I would recommend taking Flonase 2 sprays daily.  You may also use Ibuprofen as needed for fever reduction and pain relief.   Drink lots of fluids and rest.   Return or go to the Emergency Department if symptoms worsen or do not improve in the next few days.      ED Prescriptions    None     PDMP not reviewed this encounter.   Pearson Forster, NP 06/05/20 1506

## 2020-06-05 NOTE — ED Triage Notes (Signed)
Pt presents with nasal congestion and fever x 3 days. Tylenol gives relief, last dose yesterday.   Pt reports increased urinary frequency x 2 weeks.   Pt states her platelets and white cells counts are low due to thrombotic thrombocytopenic purpura.

## 2020-06-10 ENCOUNTER — Ambulatory Visit: Payer: Federal, State, Local not specified - PPO | Admitting: Physician Assistant

## 2020-06-12 ENCOUNTER — Ambulatory Visit (INDEPENDENT_AMBULATORY_CARE_PROVIDER_SITE_OTHER)
Admission: RE | Admit: 2020-06-12 | Discharge: 2020-06-12 | Disposition: A | Discharge status: Home / Self Care | Payer: Self-pay | Source: Ambulatory Visit | Attending: Cardiovascular Disease | Admitting: Cardiovascular Disease

## 2020-06-12 ENCOUNTER — Other Ambulatory Visit: Payer: Self-pay

## 2020-06-12 DIAGNOSIS — I10 Essential (primary) hypertension: Secondary | ICD-10-CM

## 2020-06-17 ENCOUNTER — Other Ambulatory Visit: Payer: Self-pay

## 2020-06-17 ENCOUNTER — Encounter: Payer: Self-pay | Admitting: Family Medicine

## 2020-06-17 ENCOUNTER — Ambulatory Visit (INDEPENDENT_AMBULATORY_CARE_PROVIDER_SITE_OTHER)
Admission: RE | Admit: 2020-06-17 | Discharge: 2020-06-17 | Disposition: A | Discharge status: Home / Self Care | Payer: Federal, State, Local not specified - PPO | Source: Ambulatory Visit | Attending: Family Medicine | Admitting: Family Medicine

## 2020-06-17 ENCOUNTER — Ambulatory Visit (INDEPENDENT_AMBULATORY_CARE_PROVIDER_SITE_OTHER): Payer: Federal, State, Local not specified - PPO | Admitting: Family Medicine

## 2020-06-17 VITALS — BP 118/68 | HR 86 | Temp 98.3°F | Ht 61.5 in | Wt 177.2 lb

## 2020-06-17 DIAGNOSIS — M7989 Other specified soft tissue disorders: Secondary | ICD-10-CM | POA: Diagnosis not present

## 2020-06-17 DIAGNOSIS — E782 Mixed hyperlipidemia: Secondary | ICD-10-CM | POA: Diagnosis not present

## 2020-06-17 DIAGNOSIS — M25531 Pain in right wrist: Secondary | ICD-10-CM | POA: Insufficient documentation

## 2020-06-17 DIAGNOSIS — Z0000 Encounter for general adult medical examination without abnormal findings: Secondary | ICD-10-CM

## 2020-06-17 DIAGNOSIS — M79641 Pain in right hand: Secondary | ICD-10-CM | POA: Insufficient documentation

## 2020-06-17 DIAGNOSIS — E119 Type 2 diabetes mellitus without complications: Secondary | ICD-10-CM | POA: Diagnosis not present

## 2020-06-17 LAB — LIPID PANEL
Cholesterol: 166 mg/dL (ref 0–200)
HDL: 67 mg/dL (ref >39.00)
LDL Cholesterol: 84 mg/dL (ref 0–99)
NonHDL: 99.08
Total CHOL/HDL Ratio: 2
Triglycerides: 75 mg/dL (ref 0.0–149.0)
VLDL: 15 mg/dL (ref 0.0–40.0)

## 2020-06-17 LAB — HEMOGLOBIN A1C: Hgb A1c MFr Bld: 6.9 % — ABNORMAL HIGH (ref 4.6–6.5)

## 2020-06-17 NOTE — Progress Notes (Signed)
Annual Exam   Chief Complaint:  Chief Complaint  Patient presents with  . Annual Exam    History of Present Illness:  [name] is a 60 y.o. G1P0011 who LMP was Patient's last menstrual period was 04/30/2012., presents today for her annual examination.    #Finger/Hand pain - cannot bend this all the way - using the brace    Nutrition She does get adequate calcium and Vitamin D in her diet. Diet: trying to follow diabetic diet Exercise: not currently   Safety The patient wears seatbelts: yes.     The patient feels safe at home and in their relationships: yes.   Menstrual:  Symptoms of menopause: vaginal dryness   GYN She is not sexually active.  .   Cervical Cancer Screening (21-65):   Last Pap:   July 2021 Results were: no abnormalities / HPV not done  Breast Cancer Screening (Age 62-74):  There is no FH of breast cancer. There is no FH of ovarian cancer. BRCA screening Not Indicated.  Last Mammogram: 10/2019 The patient does want a mammogram this year.    Colon Cancer Screening:  Age 54-75 yo - benefits outweigh the risk. Adults 34-85 yo who have never been screened benefit.  Benefits: 134000 people in 2016 will be diagnosed and 49,000 will die - early detection helps [place]: Complications 2/2 to colonoscopy High Risk (Colonoscopy): genetic disorder (Lynch syndrome or familial adenomatous polyposis), personal hx of IBD, previous adenomatous polyp, or previous colorectal cancer, FamHx start 10 years before the age at diagnosis, increased in males and black race  Options:  FIT - looks for hemoglobin (blood in the stool) - specific and fairly sensitive - must be done annually Cologuard - looks for DNA and blood - more sensitive - therefore can have more false positives, every 3 years Colonoscopy - every 10 years if normal - sedation, bowl prep, must have someone drive you  Shared decision making and the patient had decided to do colonoscopy - due  2024.   Social History   Tobacco Use  Smoking Status Never Smoker  Smokeless Tobacco Never Used    Lung Cancer Screening (Ages 10-93): not applicable   Weight Wt Readings from Last 3 Encounters:  06/17/20 177 lb 4 oz (80.4 kg)  05/21/20 178 lb (80.7 kg)  05/15/20 179 lb (81.2 kg)   Patient has high BMI  BMI Readings from Last 1 Encounters:  06/17/20 32.95 kg/m     Chronic disease screening Blood pressure monitoring:  BP Readings from Last 3 Encounters:  06/17/20 118/68  06/05/20 (!) 161/89  05/21/20 120/72    Lipid Monitoring: Indication for screening: age >20, obesity, diabetes, family hx, CV risk factors.  Lipid screening: Yes  Lab Results  Component Value Date   CHOL 166 09/26/2018   HDL 39 09/26/2018   LDLCALC 115 09/26/2018   TRIG 109 03/03/2019     Diabetes Screening: age >65, overweight, family hx, PCOS, hx of gestational diabetes, at risk ethnicity Diabetes Screening screening: Yes  Lab Results  Component Value Date   HGBA1C 6.4 (A) 02/18/2020     Past Medical History:  Diagnosis Date  . Allergy   . Arthritis   . Boil   . 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 Diabetes ([place])    type 2  . Elevated liver enzymes   . Fatty liver   . Hidradenitis   . Hyperlipemia   .  Hypertension   . 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 PVC's (premature ventricular contractions)   . 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 BREAST CYST ASPIRATION Left   . COLONOSCOPY  2014  . ECTOPIC PREGNANCY SURGERY  1990  . IR FLUORO GUIDE CV LINE RIGHT  03/05/2019  . IR FLUORO GUIDE CV LINE RIGHT  03/15/2019  . IR FLUORO GUIDE CV LINE RIGHT  05/17/2019  . IR FLUORO GUIDE CV LINE RIGHT  05/23/2019  . IR REMOVAL TUN CV CATH W/O FL  04/12/2019  . IR REMOVAL TUN CV CATH W/O FL  08/06/2019  . IR US GUIDE VASC ACCESS  RIGHT  03/05/2019  . IR US GUIDE VASC ACCESS RIGHT  03/15/2019  . IR US GUIDE VASC ACCESS RIGHT  05/17/2019  . TONSILLECTOMY  1980  . UPPER GI ENDOSCOPY  08/2017   [facility]    Prior to Admission medications   Medication Sig Start Date End Date Taking? Authorizing Provider  acetaminophen (TYLENOL) 325 MG tablet Take 650 mg by mouth every 6 (six) hours as needed.   Yes [provider]  Alum Hydroxide-Mag Carbonate (GAVISCON EXTRA STRENGTH PO) Take 1 tablet by mouth 4 (four) times daily as needed. 03/26/19  Yes [provider]  Ascorbic Acid (VITAMIN C) 500 MG CAPS Take by mouth daily.   Yes [provider]  cholecalciferol (VITAMIN D3) 25 MCG (1000 UT) tablet Take 1,000 Units by mouth daily.   Yes [provider]  estradiol (ESTRACE) 0.1 MG/GM vaginal cream 1 gram vaginally twice weekly 05/21/20  Yes Karma Ganja, NP  fexofenadine (ALLEGRA) 180 MG tablet Take 180 mg by mouth daily as needed for allergies or rhinitis.   Yes [provider]  folic acid (FOLVITE) 1 MG tablet Take 2 tablets by mouth once daily 06/01/20  Yes Ennever, Rudell Cobb, MD  glucose blood test strip Use as instructed 01/17/20  Yes Elby Beck, FNP  MAGNESIUM GLYCINATE PO Take 200 mg by mouth daily.   Yes [provider]  Multiple Vitamins-Minerals (MULTI FOR HER 50+) TABS Take 1 tablet by mouth daily.   Yes [provider]  nystatin-triamcinolone ointment (MYCOLOG) Apply 1 application topically 2 (two) times daily. 01/02/20  Yes Stover, Titorya, DPM  potassium chloride (KLOR-CON) 10 MEQ tablet TAKE 1  BY MOUTH ONCE DAILY 06/01/20  Yes Ennever, Rudell Cobb, MD  senna-docusate (SENOKOT-S) 8.6-50 MG tablet Take 2 tablets by mouth 2 (two) times daily. 03/15/19  Yes Swayze, Ava, DO  sitaGLIPtin (JANUVIA) 100 MG tablet Take 1 tablet (100 mg total) by mouth daily. 08/15/19  Yes Lesleigh Noe, MD  ZINC PICOLINATE PO Take 30 mg by mouth daily.   Yes [provider]    Allergies  Allergen Reactions  . Metformin Other (See Comments)  . Other Other (See Comments)  . Metformin And Related Itching    Heart palpitation  . Sulfa Antibiotics Itching  . Sulfasalazine Itching    Gynecologic History: Patient's last menstrual period was 04/30/2012.  Obstetric History: G1P0011  Social History   Socioeconomic History  . Marital status: Married    Spouse name: Not on file  . Number of children: 1  . Years of education: Not on file  . Highest education level: Some college, no degree  Occupational History    Employer: [employer]  Tobacco Use  . Smoking status: Never Smoker  .  Smokeless tobacco: Never Used  Vaping Use  . Vaping Use: Never used  Substance and Sexual Activity  . Alcohol use: Yes    Comment: 2 a month  . Drug use: No  . Sexual activity: Not Currently    Birth control/protection: Post-menopausal  Other Topics Concern  . 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 Hypertension Mother   . Diabetes Mother   . Heart attack Mother 56  . Asthma Mother        Childhood  . Arthritis Mother   . Heart disease Mother   . Hyperlipidemia Mother   . Hypertension Sister   . Diabetes Sister   . Asthma Sister 40       asthma attack  cause of death  . Diabetes Brother   . Hypertension Brother   . Diabetes Brother   . Hypertension Brother   . Hypertension Maternal Grandmother   . Heart attack Maternal Grandfather 60  . Colon cancer Neg Hx     Review of Systems  Constitutional: Negative for chills and fever.  HENT: Negative for congestion and sore throat.   Eyes: Negative for blurred vision and double vision.  Respiratory: Negative for shortness of breath.   Cardiovascular: Negative for chest pain.  Gastrointestinal: Negative for heartburn, nausea and vomiting.  Genitourinary: Negative.   Musculoskeletal: Positive for joint pain. Negative for myalgias.  Skin: Negative for rash.  Neurological: Negative for dizziness and headaches.  Endo/Heme/Allergies: Does not bruise/bleed easily.  Psychiatric/Behavioral: Negative for depression. The patient is not nervous/anxious.      Physical Exam BP 118/68   Pulse 86   Temp 98.3 F (36.8 C) (Temporal)   Ht 5' 1.5" (1.562 m)   Wt 177 lb 4 oz (80.4 kg)   LMP 04/30/2012   SpO2 98%   BMI 32.95 kg/m    BP Readings from Last 3 Encounters:  06/17/20 118/68  06/05/20 (!) 161/89  05/21/20 120/72      Physical Exam Constitutional:      General: She is not in acute distress.    Appearance: She is well-developed. She is not diaphoretic.  HENT:     Head: Normocephalic and atraumatic.     Right Ear: External ear normal.     Left Ear: External ear normal.     Nose: Nose normal.  Eyes:     General: No scleral icterus.    Conjunctiva/sclera: Conjunctivae normal.  Cardiovascular:     Rate and Rhythm: Normal rate and regular rhythm.     Heart sounds: No murmur heard.  Pulmonary:     Effort: Pulmonary effort is normal. No respiratory distress.     Breath sounds: Normal breath sounds. No wheezing.  Abdominal:     General: Bowel sounds are normal. There is no distension.     Palpations: Abdomen is soft. There is no mass.     Tenderness: There is no abdominal  tenderness. There is no guarding or rebound.  Musculoskeletal:        General: Normal range of motion.     Cervical back: Neck supple.     Comments: Right hand  Inspection: swelling along the dorsal surface of the wrist Palpation: ttp along the 4th digit ROM: pain with full flexion of the 4th digit, otherwise normal Strength: grip slightly diminished compared to the left   Lymphadenopathy:     Cervical: No cervical adenopathy.  Skin:    General: Skin is warm and dry.     Capillary Refill: Capillary refill takes less than 2 seconds.  Neurological:     Mental Status: She is alert and oriented to person, place, and time.     Deep Tendon Reflexes: Reflexes normal.  Psychiatric:        Behavior: Behavior normal.     Results:  PHQ-9:  [place] Visit from 06/17/2020 in [place] at [place]  PHQ-9 Total Score 8      Depression screen [facility] 2/9 06/17/2020 06/06/2019 02/26/2019  Decreased Interest 1 0 0  Down, Depressed, Hopeless 0 0 0  PHQ - 2 Score 1 0 0  Altered sleeping 3 - -  Tired, decreased energy 1 - -  Change in appetite 1 - -  Feeling bad or failure about yourself  0 - -  Trouble concentrating 1 - -  Moving slowly or fidgety/restless 1 - -  Suicidal thoughts 0 - -  PHQ-9 Score 8 - -  Difficult doing work/chores Somewhat difficult - -  Some recent data might be hidden      Assessment: 60 y.o. G15P0011 female here for routine annual physical examination.  Plan: Problem List Items Addressed This Visit      Endocrine   Controlled type 2 diabetes mellitus without complication (HCC) (Chronic)   Relevant Orders   Hemoglobin A1c     Other   Hyperlipidemia   Relevant Orders   Lipid panel   Right wrist pain    Soft tissue swelling on exam. XR Reassuring w/o fracture. Suspect tenosynovitis. Cont brace. Referral to PT for continued support.       Relevant Orders   DG Hand Complete Right   DG Wrist Complete Right   Ambulatory referral to  Physical Therapy   Right hand pain   Relevant Orders   Ambulatory referral to Physical Therapy    Other Visit Diagnoses    Annual physical exam    -  Primary      Screening: -- Blood pressure screen normal -- cholesterol screening: will obtain -- Weight screening: overweight: continue to monitor -- Diabetes Screening: will obtain -- Nutrition: Encouraged healthy diet  The 10-year ASCVD risk score Mikey Bussing DC Jr., et al., 2013) is: 9.9%*   Values used to calculate the score:     Age: 41 years     Sex: Female     Is Non-Hispanic African American: Yes     Diabetic: Yes     Tobacco smoker: No     Systolic Blood Pressure: 102 mmHg     Is BP treated: No  HDL Cholesterol: 39 mg/dL*     Total Cholesterol: 166 mg/dL*     * - Cholesterol units were assumed for this score calculation  -- Statin therapy for Age 54-75 with CVD risk >7.5%  Psych -- Depression screening (PHQ-9):  [place] Visit from 06/17/2020 in [place] at [facility]  PHQ-9 Total Score 8       Safety -- tobacco screening: not using -- alcohol screening:  low-risk usage. -- no evidence of domestic violence or intimate partner violence.   Cancer Screening -- pap smear not collected per ASCCP guidelines -- family history of breast cancer screening: done. not at high risk. -- Mammogram - up to date -- Colon cancer (age 26+)-- up to date  Immunizations Immunization History  Administered Date(s) Administered  . Td 08/04/2011    -- flu vaccine not in season -- TDAP q10 years up to date -- Shingles (age >70) unknown, record requested -- Covid-19 Vaccine declined   Encouraged healthy diet and exercise. Encouraged regular vision and dental care.    Lesleigh Noe, MD

## 2020-06-17 NOTE — Assessment & Plan Note (Signed)
Soft tissue swelling on exam. XR Reassuring w/o fracture. Suspect tenosynovitis. Cont brace. Referral to PT for continued support.

## 2020-06-17 NOTE — Patient Instructions (Addendum)
Bone health - Would recommend the following:   1) 800 units of Vitamin D daily 2) Get 1200 mg of elemental calcium --- this is best from your diet. Try to track how much calcium you get on a typical day. You could find ways to add more (dairy products, leafy greens). Take a supplement for whatever you don't typically get so you reach 1200 mg of calcium.  3) Physical activity (ideally weight bearing) - like walking briskly 30 minutes 5 days a week.     X-Ray of your wrist and hand today - will call with results  #Referral I have placed a referral to a specialist for you. You should receive a phone call from the specialty office. Make sure your voicemail is not full and that if you are able to answer your phone to unknown or new numbers.   It may take up to 2 weeks to hear about the referral. If you do not hear anything in 2 weeks, please call our office and ask to speak with the referral coordinator.

## 2020-06-24 DIAGNOSIS — K7581 Nonalcoholic steatohepatitis (NASH): Secondary | ICD-10-CM | POA: Diagnosis not present

## 2020-06-24 DIAGNOSIS — K7460 Unspecified cirrhosis of liver: Secondary | ICD-10-CM | POA: Diagnosis not present

## 2020-06-29 ENCOUNTER — Other Ambulatory Visit: Payer: Self-pay | Admitting: Hematology & Oncology

## 2020-06-29 ENCOUNTER — Ambulatory Visit: Payer: Federal, State, Local not specified - PPO | Admitting: Physical Therapy

## 2020-06-29 DIAGNOSIS — M3119 Other thrombotic microangiopathy: Secondary | ICD-10-CM

## 2020-07-02 ENCOUNTER — Other Ambulatory Visit: Payer: Self-pay | Admitting: Family Medicine

## 2020-07-02 DIAGNOSIS — E119 Type 2 diabetes mellitus without complications: Secondary | ICD-10-CM

## 2020-07-07 DIAGNOSIS — K080 Exfoliation of teeth due to systemic causes: Secondary | ICD-10-CM | POA: Diagnosis not present

## 2020-07-16 ENCOUNTER — Encounter: Payer: Self-pay | Admitting: Emergency Medicine

## 2020-07-16 ENCOUNTER — Ambulatory Visit
Admission: EM | Admit: 2020-07-16 | Discharge: 2020-07-16 | Disposition: A | Discharge status: Home / Self Care | Payer: Federal, State, Local not specified - PPO | Attending: Family Medicine | Admitting: Family Medicine

## 2020-07-16 ENCOUNTER — Telehealth: Payer: Self-pay

## 2020-07-16 ENCOUNTER — Other Ambulatory Visit: Payer: Self-pay

## 2020-07-16 DIAGNOSIS — Z7984 Long term (current) use of oral hypoglycemic drugs: Secondary | ICD-10-CM | POA: Diagnosis not present

## 2020-07-16 DIAGNOSIS — E0865 Diabetes mellitus due to underlying condition with hyperglycemia: Secondary | ICD-10-CM

## 2020-07-16 DIAGNOSIS — R350 Frequency of micturition: Secondary | ICD-10-CM | POA: Diagnosis not present

## 2020-07-16 DIAGNOSIS — N393 Stress incontinence (female) (male): Secondary | ICD-10-CM | POA: Diagnosis not present

## 2020-07-16 LAB — POCT URINALYSIS DIP (MANUAL ENTRY)
Bilirubin, UA: NEGATIVE
Blood, UA: NEGATIVE
Glucose, UA: NEGATIVE mg/dL
Ketones, POC UA: NEGATIVE mg/dL
Leukocytes, UA: NEGATIVE
Nitrite, UA: NEGATIVE
Protein Ur, POC: NEGATIVE mg/dL
Spec Grav, UA: <=1.005 — AB (ref 1.010–1.025)
Urobilinogen, UA: 0.2 E.U./dL
pH, UA: 5.5 (ref 5.0–8.0)

## 2020-07-16 LAB — POCT FASTING CBG KUC MANUAL ENTRY: POCT Glucose (KUC): 184 mg/dL — AB (ref 70–99)

## 2020-07-16 MED ORDER — OXYBUTYNIN CHLORIDE ER 10 MG PO TB24: 10.0000 mg | ORAL_TABLET | Freq: Every day | ORAL | 0 refills | Status: DC

## 2020-07-16 NOTE — ED Triage Notes (Signed)
Pt here for dysuria and frequency x 2 days

## 2020-07-16 NOTE — Telephone Encounter (Signed)
Agree with urgent evaluation given fever.

## 2020-07-16 NOTE — Telephone Encounter (Signed)
Patient called and stated that she has been experiencing urinary frequency, burning with urination, urinary urgency and fever for one week. Patient denied other acute symptoms. Advised patient that she should be seen as soon as possible. Patient agreed to go to UC. Sending to Dr. Einar Pheasant as Juluis Rainier.

## 2020-07-21 NOTE — ED Provider Notes (Signed)
[place]    ASSESSMENT & PLAN:  1. Urinary frequency   2. Diabetes mellitus due to underlying condition with hyperglycemia, without long-term current use of insulin (HCC)   3. Stress incontinence     Labs Reviewed  POCT URINALYSIS DIP (MANUAL ENTRY) - Abnormal; Notable for the following components:      Result Value   Spec Grav, UA <=1.005 (*)    All other components within normal limits  POCT FASTING CBG KUC MANUAL ENTRY - Abnormal; Notable for the following components:   POCT Glucose (KUC) 184 (*)    All other components within normal limits    Begin trial of: Meds ordered this encounter  Medications  . oxybutynin (DITROPAN XL) 10 MG 24 hr tablet    Sig: Take 1 tablet (10 mg total) by mouth at bedtime.    Dispense:  14 tablet    Refill:  0   Will monitor blood sugars at home. Reports taking medications as prescribed. No sign of UTI.  Begin above medication. Limit caffeine.  Recommend:  Follow-up Information    Schedule an appointment as soon as possible for a visit  with Lesleigh Noe, MD.   Specialty: [facility] Contact information: [place] [phone]              Will follow up with her PCP or here if not showing improvement over the next 48 hours, sooner if needed.  Outlined signs and symptoms indicating need for more acute intervention. Patient verbalized understanding. After Visit Summary given.  SUBJECTIVE:  Vicki [name] is a 60 y.o. female who reports urinary frequency and occasional dysuria. Past two days. Afebrile. Also reports incontinence with certain activities. No hematuria. Blood sugars have been slightly elevated. No freq thirst. Normal appetite without n/v/d. No specific aggravating or alleviating factors regarding current symptoms reported. Without specific abdominal or pelvic pain. Ambulatory without difficulty. No tx PTA.  LMP: Patient's last menstrual period was  04/30/2012.   OBJECTIVE:  Vitals:   07/16/20 1856  BP: (!) 159/80  Pulse: 90  Resp: 18  Temp: 99.2 F (37.3 C)  TempSrc: Oral  SpO2: 95%   General appearance: alert; no distress HENT: oropharynx: moist Lungs: unlabored respirations Abdomen: soft, non-tender Back: no CVA tenderness Extremities: no edema; symmetrical with no gross deformities Skin: warm and dry Neurologic: normal gait Psychological: alert and cooperative; normal mood and affect  Labs Reviewed  POCT URINALYSIS DIP (MANUAL ENTRY) - Abnormal; Notable for the following components:      Result Value   Spec Grav, UA <=1.005 (*)    All other components within normal limits  POCT FASTING CBG KUC MANUAL ENTRY - Abnormal; Notable for the following components:   POCT Glucose (KUC) 184 (*)    All other components within normal limits    Allergies  Allergen Reactions  . Metformin Other (See Comments)  . Other Other (See Comments)  . Metformin And Related Itching    Heart palpitation  . Sulfa Antibiotics Itching  . Sulfasalazine Itching    Past Medical History:  Diagnosis Date  . Allergy   . Arthritis   . Boil   . 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 Diabetes ([place])    type 2  . Elevated liver enzymes   . Fatty liver   . Hidradenitis   . Hyperlipemia   . Hypertension   .  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 PVC's (premature ventricular contractions)   . 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 Marital status: Married    Spouse name: Not on file  . Number of children: 1  . Years of education: Not on file  . Highest education level: Some college, no degree  Occupational History    Employer: [employer]  Tobacco Use  . Smoking status: Never Smoker  . Smokeless tobacco: Never Used  Vaping Use  . Vaping Use: Never used  Substance and Sexual Activity  . Alcohol  use: Yes    Comment: 2 a month  . Drug use: No  . Sexual activity: Not Currently    Birth control/protection: Post-menopausal  Other Topics Concern  . 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 Hypertension Mother   . Diabetes Mother   . Heart attack Mother 40  . Asthma Mother        Childhood  . Arthritis Mother   . Heart disease Mother   . Hyperlipidemia Mother   . Hypertension Sister   . Diabetes Sister   . Asthma Sister 40       asthma attack cause of death  . Diabetes Brother   . Hypertension Brother   . Diabetes Brother   . Hypertension Brother   . Hypertension Maternal Grandmother   . Heart attack Maternal Grandfather 60  . Colon cancer Neg Hx        Vanessa Kick, MD 07/21/20 1024

## 2020-07-22 ENCOUNTER — Telehealth: Payer: Self-pay

## 2020-07-22 NOTE — Telephone Encounter (Signed)
Pt said she has been taking mucinex for 2 wks but still has prod cough with thick phlegm that has some brown and red spots in it. No CP, SOB or difficulty breathing;not a lot of blood seen. Pt also said her BS was running 100 or over 100. UC & ED precautions given and pt voiced understanding. Pt scheduled video visit on 07/23/20 at 11 AM with Dr Einar Pheasant. Sending note to Dr Einar Pheasant.

## 2020-07-23 ENCOUNTER — Telehealth (INDEPENDENT_AMBULATORY_CARE_PROVIDER_SITE_OTHER): Payer: Federal, State, Local not specified - PPO | Admitting: Family Medicine

## 2020-07-23 ENCOUNTER — Encounter: Payer: Self-pay | Admitting: Family Medicine

## 2020-07-23 DIAGNOSIS — L02224 Furuncle of groin: Secondary | ICD-10-CM

## 2020-07-23 DIAGNOSIS — J0100 Acute maxillary sinusitis, unspecified: Secondary | ICD-10-CM | POA: Diagnosis not present

## 2020-07-23 DIAGNOSIS — E119 Type 2 diabetes mellitus without complications: Secondary | ICD-10-CM

## 2020-07-23 MED ORDER — AMOXICILLIN-POT CLAVULANATE 875-125 MG PO TABS: 1.0000 | ORAL_TABLET | Freq: Two times a day (BID) | ORAL | 0 refills | Status: AC

## 2020-07-23 NOTE — Telephone Encounter (Signed)
See noted from today

## 2020-07-23 NOTE — Assessment & Plan Note (Signed)
Endorses sugars up. Advised continued monitoring. Suspect 2/2 to infection. Cont [place] and keep appt in June

## 2020-07-23 NOTE — Progress Notes (Signed)
I connected with Vicki [name] on 07/23/20 at 11:00 AM EDT by video and verified that I am speaking with the correct person using two identifiers.   I discussed the limitations, risks, security and privacy concerns of performing an evaluation and management service by video and the availability of in person appointments. I also discussed with the patient that there may be a patient responsible charge related to this service. The patient expressed understanding and agreed to proceed.  Patient location: riding in a car Provider Location: [place] Participants: Lesleigh Noe and Trezure Holley Bouche   Subjective:     Vicki [name] is a 60 y.o. female presenting for Cough (Productive with thick mucous x 3 weeks /Negative covid test 07/14/20)     Cough This is a new problem. The current episode started 1 to 4 weeks ago. The problem has been unchanged. The problem occurs constantly. The cough is productive of sputum. Associated symptoms include chest pain, hemoptysis, nasal congestion, a sore throat (resolved) and wheezing. Pertinent negatives include no chills, ear congestion, ear pain, fever, headaches or shortness of breath.   Sick contact: did have this a while ago and it improved and then her daughter got sick and passed it back   Boils have returned on legs  Sugars have been up for diabetes  Review of Systems  Constitutional: Negative for chills and fever.  HENT: Positive for sore throat (resolved). Negative for ear pain.        Jaw pain  Eyes: Negative for itching.  Respiratory: Positive for cough, hemoptysis and wheezing. Negative for shortness of breath.   Cardiovascular: Positive for chest pain.  Gastrointestinal: Negative for abdominal pain, diarrhea, nausea and vomiting.  Neurological: Negative for headaches.     Social History   Tobacco Use  Smoking Status Never Smoker  Smokeless Tobacco Never Used        Objective:   BP Readings  from Last 3 Encounters:  07/16/20 (!) 159/80  06/17/20 118/68  06/05/20 (!) 161/89   Wt Readings from Last 3 Encounters:  06/17/20 177 lb 4 oz (80.4 kg)  05/21/20 178 lb (80.7 kg)  05/15/20 179 lb (81.2 kg)    LMP 04/30/2012    Physical Exam Constitutional:      Appearance: Normal appearance. She is not ill-appearing.  HENT:     Head: Normocephalic and atraumatic.     Right Ear: External ear normal.     Left Ear: External ear normal.  Eyes:     Conjunctiva/sclera: Conjunctivae normal.  Pulmonary:     Effort: Pulmonary effort is normal. No respiratory distress.  Neurological:     Mental Status: She is alert. Mental status is at baseline.  Psychiatric:        Mood and Affect: Mood normal.        Behavior: Behavior normal.        Thought Content: Thought content normal.        Judgment: Judgment normal.            Assessment & Plan:   Problem List Items Addressed This Visit      Endocrine   Controlled type 2 diabetes mellitus without complication (HCC) (Chronic)    Endorses sugars up. Advised continued monitoring. Suspect 2/2 to infection. Cont [place] and keep appt in June        Musculoskeletal and Integument   Boil of groin    Given infection - will use oral abx to help  cover. Return precautions if worsening discussed. Warm compresses.        Other Visit Diagnoses    Acute non-recurrent maxillary sinusitis    -  Primary   Relevant Medications   amoxicillin-clavulanate (AUGMENTIN) 875-125 MG tablet     Given cough, jaw pain, sinus congestion and duration of symptoms will treat with antibiotics. Pt mentioned recurrent boils being present - not examined due to virtual so will use augmentin to try to cover both.      Return if symptoms worsen or fail to improve.  Lesleigh Noe, MD

## 2020-07-23 NOTE — Assessment & Plan Note (Signed)
Given infection - will use oral abx to help cover. Return precautions if worsening discussed. Warm compresses.

## 2020-07-30 ENCOUNTER — Inpatient Hospital Stay: Payer: Federal, State, Local not specified - PPO | Attending: Hematology & Oncology

## 2020-07-30 ENCOUNTER — Inpatient Hospital Stay: Payer: Federal, State, Local not specified - PPO | Admitting: Hematology & Oncology

## 2020-07-30 ENCOUNTER — Other Ambulatory Visit: Payer: Self-pay

## 2020-07-30 ENCOUNTER — Telehealth: Payer: Self-pay

## 2020-07-30 VITALS — BP 149/77 | HR 77 | Temp 98.0°F | Resp 18 | Ht 61.0 in | Wt 176.4 lb

## 2020-07-30 DIAGNOSIS — K7581 Nonalcoholic steatohepatitis (NASH): Secondary | ICD-10-CM | POA: Insufficient documentation

## 2020-07-30 DIAGNOSIS — M3119 Other thrombotic microangiopathy: Secondary | ICD-10-CM | POA: Diagnosis not present

## 2020-07-30 DIAGNOSIS — Z79899 Other long term (current) drug therapy: Secondary | ICD-10-CM | POA: Diagnosis not present

## 2020-07-30 DIAGNOSIS — K7460 Unspecified cirrhosis of liver: Secondary | ICD-10-CM | POA: Insufficient documentation

## 2020-07-30 DIAGNOSIS — E119 Type 2 diabetes mellitus without complications: Secondary | ICD-10-CM | POA: Diagnosis not present

## 2020-07-30 LAB — CBC WITH DIFFERENTIAL (CANCER CENTER ONLY)
Abs Immature Granulocytes: 0.01 10*3/uL (ref 0.00–0.07)
Basophils Absolute: 0 10*3/uL (ref 0.0–0.1)
Basophils Relative: 1 %
Eosinophils Absolute: 0.3 10*3/uL (ref 0.0–0.5)
Eosinophils Relative: 6 %
HCT: 36.6 % (ref 36.0–46.0)
Hemoglobin: 12.5 g/dL (ref 12.0–15.0)
Immature Granulocytes: 0 %
Lymphocytes Relative: 42 %
Lymphs Abs: 1.8 10*3/uL (ref 0.7–4.0)
MCH: 30.1 pg (ref 26.0–34.0)
MCHC: 34.2 g/dL (ref 30.0–36.0)
MCV: 88.2 fL (ref 80.0–100.0)
Monocytes Absolute: 0.3 10*3/uL (ref 0.1–1.0)
Monocytes Relative: 7 %
Neutro Abs: 1.9 10*3/uL (ref 1.7–7.7)
Neutrophils Relative %: 44 %
Platelet Count: 135 10*3/uL — ABNORMAL LOW (ref 150–400)
RBC: 4.15 MIL/uL (ref 3.87–5.11)
RDW: 12.6 % (ref 11.5–15.5)
WBC Count: 4.2 10*3/uL (ref 4.0–10.5)
nRBC: 0 % (ref 0.0–0.2)

## 2020-07-30 LAB — CMP (CANCER CENTER ONLY)
ALT: 32 U/L (ref 0–44)
AST: 41 U/L (ref 15–41)
Albumin: 4.1 g/dL (ref 3.5–5.0)
Alkaline Phosphatase: 102 U/L (ref 38–126)
Anion gap: 5 (ref 5–15)
BUN: 9 mg/dL (ref 6–20)
CO2: 29 mmol/L (ref 22–32)
Calcium: 10.7 mg/dL — ABNORMAL HIGH (ref 8.9–10.3)
Chloride: 101 mmol/L (ref 98–111)
Creatinine: 0.89 mg/dL (ref 0.44–1.00)
GFR, Estimated: >60 mL/min (ref >60)
Glucose, Bld: 211 mg/dL — ABNORMAL HIGH (ref 70–99)
Potassium: 4 mmol/L (ref 3.5–5.1)
Sodium: 135 mmol/L (ref 135–145)
Total Bilirubin: 0.6 mg/dL (ref 0.3–1.2)
Total Protein: 7.5 g/dL (ref 6.5–8.1)

## 2020-07-30 LAB — LACTATE DEHYDROGENASE: LDH: 140 U/L (ref 98–192)

## 2020-07-30 LAB — SAVE SMEAR(SSMR), FOR PROVIDER SLIDE REVIEW

## 2020-07-30 LAB — RETICULOCYTES
Immature Retic Fract: 10.9 % (ref 2.3–15.9)
RBC.: 4.13 MIL/uL (ref 3.87–5.11)
Retic Count, Absolute: 57 10*3/uL (ref 19.0–186.0)
Retic Ct Pct: 1.4 % (ref 0.4–3.1)

## 2020-07-30 NOTE — Telephone Encounter (Signed)
appts made per 07/30/20 los and pt will gain sch at checkout and through [employer]

## 2020-07-30 NOTE — Progress Notes (Signed)
Hematology and Oncology Follow Up Visit  Shacora Zynda [id number] 1960/06/11 60 y.o. 07/30/2020   Principle Diagnosis:   TTP - acquired -- relapsed  NASH -- leukopenia/thrombocytopenia  Current Therapy:    Plasma Exchange - M-Th -- d/c on 08/01/2019  Rituxan 375 mg/m2 IV weekly -- s/p cycle #4 - completed on 06/04/2019     Interim History:  Ms. Gerlich is back for her follow-up.  I cannot believe that we saw her back in January.  Since then, she has been doing quite well.  I think she says she was at [facility] in April.  She has had that the hepatologist felt that she was doing fairly well.  When we last saw her in January, the  ADAMTS-13 was over 100.  This is truly a good sign for [place].  She has had no problems with nausea or vomiting.  She has had no cough or shortness of breath.  There is been no issues with COVID.  She had COVID back in September but seems to have gone through all of this.  She has had no change in bowel or bladder habits.  She has had no bleeding.  There is been no bruising.  Overall, I would have to say her performance status is ECOG 1.   Medications:  Current Outpatient Medications:  .  acetaminophen (TYLENOL) 325 MG tablet, Take 650 mg by mouth every 6 (six) hours as needed., Disp: , Rfl:  .  Alum Hydroxide-Mag Carbonate (GAVISCON EXTRA STRENGTH PO), Take 1 tablet by mouth 4 (four) times daily as needed., Disp: , Rfl:  .  Ascorbic Acid (VITAMIN C) 500 MG CAPS, Take by mouth daily., Disp: , Rfl:  .  cholecalciferol (VITAMIN D3) 25 MCG (1000 UT) tablet, Take 1,000 Units by mouth daily., Disp: , Rfl:  .  estradiol (ESTRACE) 0.1 MG/GM vaginal cream, 1 gram vaginally twice weekly, Disp: 42.5 g, Rfl: 6 .  fexofenadine (ALLEGRA) 180 MG tablet, Take 180 mg by mouth daily as needed for allergies or rhinitis., Disp: , Rfl:  .  folic acid (FOLVITE) 1 MG tablet, Take 2 tablets by mouth once daily, Disp: 60 tablet, Rfl: 0 .  glucose blood test strip, Use as  instructed, Disp: 100 each, Rfl: 12 .  MAGNESIUM GLYCINATE PO, Take 200 mg by mouth daily., Disp: , Rfl:  .  Multiple Vitamins-Minerals (MULTI FOR HER 50+) TABS, Take 1 tablet by mouth daily., Disp: , Rfl:  .  nystatin-triamcinolone ointment (MYCOLOG), Apply 1 application topically 2 (two) times daily., Disp: 30 g, Rfl: 0 .  oxybutynin (DITROPAN XL) 10 MG 24 hr tablet, Take 1 tablet (10 mg total) by mouth at bedtime., Disp: 14 tablet, Rfl: 0 .  potassium chloride (KLOR-CON) 10 MEQ tablet, TAKE 1  BY MOUTH ONCE DAILY, Disp: 30 tablet, Rfl: 0 .  senna-docusate (SENOKOT-S) 8.6-50 MG tablet, Take 2 tablets by mouth 2 (two) times daily., Disp: 20 tablet, Rfl: 0 .  sitaGLIPtin (JANUVIA) 100 MG tablet, Take 1 tablet (100 mg total) by mouth daily., Disp: 90 tablet, Rfl: 3 .  ZINC PICOLINATE PO, Take 30 mg by mouth daily., Disp: , Rfl:   Allergies:  Allergies  Allergen Reactions  . Metformin Other (See Comments)  . Other Other (See Comments)  . Metformin And Related Itching    Heart palpitation  . Sulfa Antibiotics Itching  . Sulfasalazine Itching    Past Medical History, Surgical history, Social history, and Family History were reviewed and updated.  Review of  Systems: Review of Systems  Constitutional: Negative.   HENT:  Negative.   Eyes: Negative.   Respiratory: Negative.   Cardiovascular: Positive for leg swelling.  Gastrointestinal: Negative.   Endocrine: Negative.   Genitourinary: Negative.    Musculoskeletal: Positive for arthralgias and myalgias.  Skin: Negative.   Neurological: Negative.   Hematological: Negative.   Psychiatric/Behavioral: Negative.     Physical Exam:  height is 5' 1"  (1.549 m) and weight is 176 lb 6.4 oz (80 kg). Her oral temperature is 98 F (36.7 C). Her blood pressure is 149/77 (abnormal) and her pulse is 77. Her respiration is 18 and oxygen saturation is 96%.   Wt Readings from Last 3 Encounters:  07/30/20 176 lb 6.4 oz (80 kg)  06/17/20 177 lb 4 oz  (80.4 kg)  05/21/20 178 lb (80.7 kg)    Physical Exam Vitals reviewed.  HENT:     Head: Normocephalic and atraumatic.  Eyes:     Pupils: Pupils are equal, round, and reactive to light.  Cardiovascular:     Rate and Rhythm: Normal rate and regular rhythm.     Heart sounds: Normal heart sounds.  Pulmonary:     Effort: Pulmonary effort is normal.     Breath sounds: Normal breath sounds.  Abdominal:     General: Bowel sounds are normal.     Palpations: Abdomen is soft.  Musculoskeletal:        General: No tenderness or deformity. Normal range of motion.     Cervical back: Normal range of motion.     Comments: Extremities shows some 1+ edema in her lower legs.  She has good range of motion of her joints.  She has good strength in upper and lower extremities.  Lymphadenopathy:     Cervical: No cervical adenopathy.  Skin:    General: Skin is warm and dry.     Findings: No erythema or rash.  Neurological:     Mental Status: She is alert and oriented to person, place, and time.  Psychiatric:        Behavior: Behavior normal.        Thought Content: Thought content normal.        Judgment: Judgment normal.      Lab Results  Component Value Date   WBC 4.2 07/30/2020   HGB 12.5 07/30/2020   HCT 36.6 07/30/2020   MCV 88.2 07/30/2020   PLT 135 (L) 07/30/2020     Chemistry      Component Value Date/Time   NA 135 07/30/2020 1450   K 4.0 07/30/2020 1450   CL 101 07/30/2020 1450   CO2 29 07/30/2020 1450   BUN 9 07/30/2020 1450   CREATININE 0.89 07/30/2020 1450   CREATININE 0.70 09/25/2015 1504      Component Value Date/Time   CALCIUM 10.7 (H) 07/30/2020 1450   ALKPHOS 102 07/30/2020 1450   AST 41 07/30/2020 1450   ALT 32 07/30/2020 1450   BILITOT 0.6 07/30/2020 1450      Impression and Plan: Ms. Beeck is a 60 year old African-American female.  She has acquired TTP.  She had relapsed.  She responded well to treatment with plasma exchange and Rituxan.  She is now has  been off treatment now for over 1 year.  We will have to see what the ADAMTS-13 is.  I believe that this is going be fine by her platelet count.  I looked at  her blood smear.  I do not see anything under the  blood smear that look like TTP activation.  I do not see any schistocytes.  There is no nucleated red blood cells.  Platelets were well granulated.  I still think that her prognosis will be based on her cirrhosis and her NASH and diabetes.  We will plan to get her back in another 4 months or so.  We will definitely get her through the summertime now.     Volanda Napoleon, MD 5/26/20224:08 PM

## 2020-08-05 LAB — ADAMTS13 ACTIVITY: Adamts 13 Activity: >100 % (ref >66.8)

## 2020-08-05 LAB — ADAMTS13 ACTIVITY REFLEX

## 2020-08-06 ENCOUNTER — Other Ambulatory Visit: Payer: Self-pay

## 2020-08-06 ENCOUNTER — Encounter: Payer: Self-pay | Admitting: Family Medicine

## 2020-08-06 ENCOUNTER — Ambulatory Visit: Payer: Federal, State, Local not specified - PPO | Admitting: Family Medicine

## 2020-08-06 VITALS — BP 126/64 | HR 82 | Temp 98.6°F | Ht 61.0 in | Wt 179.0 lb

## 2020-08-06 DIAGNOSIS — B353 Tinea pedis: Secondary | ICD-10-CM

## 2020-08-06 DIAGNOSIS — R195 Other fecal abnormalities: Secondary | ICD-10-CM | POA: Diagnosis not present

## 2020-08-06 DIAGNOSIS — E119 Type 2 diabetes mellitus without complications: Secondary | ICD-10-CM

## 2020-08-06 MED ORDER — OZEMPIC (0.25 OR 0.5 MG/DOSE) 2 MG/1.5ML ~~LOC~~ SOPN: 0.2500 mg | PEN_INJECTOR | SUBCUTANEOUS | 1 refills | Status: DC

## 2020-08-06 NOTE — Assessment & Plan Note (Signed)
Worsening CBG at home. Will start ozempic for weight loss benefit and glucose control. Cont [place]. Return in 4 months. Update monthly for dose increase.

## 2020-08-06 NOTE — Patient Instructions (Addendum)
How to Use Hibiclens Use your normal shampoo to wash your hair. Rinse your head well. Use your normal soap to wash your face and genital area. Rinse your body well with warm water. Open the Hibiclens bottle. Pour some solution into your hand or a clean washcloth. Don't dilute (mix) the Hibiclens with water before doing this. Move away from the shower stream to avoid rinsing off the Hibiclens too soon. Rub the Hibiclens gently over your body from your neck to your feet. Don't put the Hibiclens on: Your head or face (including your eyes, ears, and mouth). Your genital area. Wounds or scrapes that are deeper than the top layer of skin. If you have a wound and aren't sure if you should use Hibiclens, ask your healthcare provider. Move back into the shower stream to rinse off the Hibiclens with warm water. Rinse your body well. Dry yourself off with a clean towel after your shower.  #Diabetes - start ozempic 0.25 mg - Call the clinic in 3-4 weeks and update if tolating - update once a month for dose increase - return 4 months for diabetes  #Athletes foot - try over the counter medication for this

## 2020-08-06 NOTE — Progress Notes (Signed)
Subjective:     Vicki [name] is a 60 y.o. female presenting for Diabetes (F/u )     HPI  #Diabetes Currently taking januvia Using medications without difficulties: Yes Hypoglycemic episodes:No  Hyperglycemic episodes:Yes  Feet problems:Yes  Blood Sugars averaging: has been having higher sugars since stopping trulicity Last BWIO0B:  Lab Results  Component Value Date   HGBA1C 6.9 (H) 06/17/2020    Diabetes Health Maintenance Due:    Diabetes Health Maintenance Due  Topic Date Due  . URINE MICROALBUMIN  02/26/2020  . FOOT EXAM  08/14/2020  . HEMOGLOBIN A1C  12/17/2020  . OPHTHALMOLOGY EXAM  01/13/2021    #Stool changes - dark stools - no tarry or sticky - just darker color - no blood in the stool  - no weight loss - endorses recent gastritis - last month but this has resolved   Review of Systems   Social History   Tobacco Use  Smoking Status Never Smoker  Smokeless Tobacco Never Used        Objective:    BP Readings from Last 3 Encounters:  08/06/20 126/64  07/30/20 (!) 149/77  07/16/20 (!) 159/80   Wt Readings from Last 3 Encounters:  08/06/20 179 lb (81.2 kg)  07/30/20 176 lb 6.4 oz (80 kg)  06/17/20 177 lb 4 oz (80.4 kg)    BP 126/64   Pulse 82   Temp 98.6 F (37 C) (Temporal)   Ht 5' 1"  (1.549 m)   Wt 179 lb (81.2 kg)   LMP 04/30/2012   SpO2 96%   BMI 33.82 kg/m    Physical Exam Constitutional:      General: She is not in acute distress.    Appearance: She is well-developed. She is not diaphoretic.  HENT:     Right Ear: External ear normal.     Left Ear: External ear normal.     Nose: Nose normal.  Eyes:     Conjunctiva/sclera: Conjunctivae normal.  Cardiovascular:     Rate and Rhythm: Normal rate and regular rhythm.     Heart sounds: No murmur heard.   Pulmonary:     Effort: Pulmonary effort is normal. No respiratory distress.     Breath sounds: Normal breath sounds. No wheezing.  Musculoskeletal:      Cervical back: Neck supple.  Skin:    General: Skin is warm and dry.     Capillary Refill: Capillary refill takes less than 2 seconds.     Comments: Left foot with flaky skin on the plantar surface. Nails painted but some appear thickened  Neurological:     Mental Status: She is alert. Mental status is at baseline.  Psychiatric:        Mood and Affect: Mood normal.        Behavior: Behavior normal.           Assessment & Plan:   Problem List Items Addressed This Visit      Endocrine   Controlled type 2 diabetes mellitus without complication (HCC) (Chronic)    Worsening CBG at home. Will start ozempic for weight loss benefit and glucose control. Cont [place]. Return in 4 months. Update monthly for dose increase.       Relevant Medications   Semaglutide,0.25 or 0.5MG/DOS, (OZEMPIC, 0.25 OR 0.5 MG/DOSE,) 2 MG/1.5ML SOPN   Other Relevant Orders   Microalbumin / creatinine urine ratio     Musculoskeletal and Integument   Tinea pedis    Trial of OTC  treatment. If not improved call.         Other   Dark stools - Primary    Will check FOBT to look for blood and if present refer to GI. Last colonoscopy 2014 good for 10 years. Notes recent gastritis so this could be contributing. Recent cbc w/o anemia.       Relevant Orders   Fecal occult blood, imunochemical       Return in about 4 months (around 12/06/2020).  Lesleigh Noe, MD  This visit occurred during the SARS-CoV-2 public health emergency.  Safety protocols were in place, including screening questions prior to the visit, additional usage of staff PPE, and extensive cleaning of exam room while observing appropriate contact time as indicated for disinfecting solutions.

## 2020-08-06 NOTE — Assessment & Plan Note (Signed)
Will check FOBT to look for blood and if present refer to GI. Last colonoscopy 2014 good for 10 years. Notes recent gastritis so this could be contributing. Recent cbc w/o anemia.

## 2020-08-06 NOTE — Assessment & Plan Note (Signed)
Trial of OTC treatment. If not improved call.

## 2020-08-10 LAB — MICROALBUMIN / CREATININE URINE RATIO
Creatinine,U: 116.4 mg/dL
Microalb Creat Ratio: 0.6 mg/g (ref 0.0–30.0)
Microalb, Ur: <0.7 mg/dL (ref 0.0–1.9)

## 2020-08-11 ENCOUNTER — Other Ambulatory Visit: Payer: Self-pay | Admitting: Hematology & Oncology

## 2020-08-11 DIAGNOSIS — M3119 Other thrombotic microangiopathy: Secondary | ICD-10-CM

## 2020-08-21 ENCOUNTER — Telehealth: Payer: Self-pay | Admitting: *Deleted

## 2020-08-21 NOTE — Telephone Encounter (Signed)
Sent disability papers to [facility].

## 2020-08-24 ENCOUNTER — Other Ambulatory Visit (INDEPENDENT_AMBULATORY_CARE_PROVIDER_SITE_OTHER): Payer: Federal, State, Local not specified - PPO

## 2020-08-24 DIAGNOSIS — R195 Other fecal abnormalities: Secondary | ICD-10-CM | POA: Diagnosis not present

## 2020-08-24 LAB — FECAL OCCULT BLOOD, IMMUNOCHEMICAL: Fecal Occult Bld: NEGATIVE

## 2020-08-26 ENCOUNTER — Other Ambulatory Visit: Payer: Self-pay | Admitting: Sports Medicine

## 2020-08-26 NOTE — Telephone Encounter (Signed)
Please advise 

## 2020-08-31 ENCOUNTER — Telehealth: Payer: Self-pay

## 2020-08-31 NOTE — Telephone Encounter (Signed)
Pt called and left VM to give an update on how she feels taking Ozempic. Pt states that she has more stomach aches, feeling full, bloated, and having heartburn. Also, having headaches and blurry vision. Pt states that she doesn't care for the ozempic.

## 2020-09-01 ENCOUNTER — Telehealth: Payer: Self-pay | Admitting: Cardiovascular Disease

## 2020-09-01 NOTE — Telephone Encounter (Signed)
Please thank her for the update, we are sorry to hear that she is experiencing these symptoms which are known side effects of Ozempic.  My recommendation would be to stop the Ozempic and await Dr. Verda Cumins instructions upon her return.

## 2020-09-01 NOTE — Telephone Encounter (Signed)
Patient c/o Palpitations:  High priority if patient c/o lightheadedness, shortness of breath, or chest pain  How long have you had palpitations/irregular HR/ Afib? Are you having the symptoms now? Since Sunday 08/30/20   Are you currently experiencing lightheadedness, SOB or CP? Chest Discomfort since heartburn Sunday by no heartburn now. No SOB  Do you have a history of afib (atrial fibrillation) or irregular heart rhythm?  Yes  Have you checked your BP or HR? (document readings if available): yesterday 127/74 HR 82  Are you experiencing any other symptoms?  Chest Discomfort not pain because it doesn't hurt but it's in the middle left. No palpitations right now

## 2020-09-01 NOTE — Telephone Encounter (Signed)
Contacted patient, she states that she has had some coming and going chest discomfort. It is not painful, not sharp, just discomfort. She has no new swelling, and no SOB. She states BP was up a little and the highest it has been is 145/82 HR 76, but she states it normally stays around 129/75 HR 82. She also noticed that her PCP gave her a BP medication Losartan 50 mg back in February but she has not taken it. She is wanting to ask Dr.Berry if she should start this, and if he recommends a BP medication at all and if so which one. Patient advised that I would send a message over to Dr.Berry to review, but she states she will try OTC acid medication to see if it helps with the discomfort as she has been having issues with heartburn. She states she will continue to monitor BP/HR.  Will route to MD to advise.  Thanks!

## 2020-09-02 NOTE — Telephone Encounter (Signed)
FYI for Dr. Einar Pheasant:  Spoke to pt and relayed Kate's message for pt to stop Ozempic at this time. Pt will stop. Made pt aware of Dr. Verda Cumins return date and that we will update her at that time. Pt stated understanding.

## 2020-09-03 NOTE — Telephone Encounter (Signed)
I attempted to contact patient to advise it was okay to start Losartan.  Unable to reach patient, will try again.

## 2020-09-03 NOTE — Telephone Encounter (Signed)
Called patient, LVM with message below.   Do you suggest that she start Losartan from PCP or hold?   Thanks!

## 2020-09-04 NOTE — Telephone Encounter (Signed)
Called patient, advised okay to start losartan.  Advised to call back with any concerns/questions

## 2020-09-08 NOTE — Telephone Encounter (Signed)
Agree with stopping.   Would recommend she schedule f/u visit after 09/16/2020 to recheck hemoglobin a1c and we can discuss alternative medications if needed   Continue diabetic diet and exercise

## 2020-09-08 NOTE — Telephone Encounter (Signed)
Left detailed VM (DPR) relaying message, per Dr. Einar Pheasant.

## 2020-09-09 ENCOUNTER — Other Ambulatory Visit: Payer: Self-pay | Admitting: Hematology & Oncology

## 2020-09-09 DIAGNOSIS — M3119 Other thrombotic microangiopathy: Secondary | ICD-10-CM

## 2020-09-15 ENCOUNTER — Other Ambulatory Visit: Payer: Self-pay | Admitting: Hematology & Oncology

## 2020-09-15 ENCOUNTER — Other Ambulatory Visit: Payer: Self-pay | Admitting: Family Medicine

## 2020-09-15 DIAGNOSIS — E119 Type 2 diabetes mellitus without complications: Secondary | ICD-10-CM

## 2020-09-25 DIAGNOSIS — K7581 Nonalcoholic steatohepatitis (NASH): Secondary | ICD-10-CM | POA: Diagnosis not present

## 2020-09-25 DIAGNOSIS — K7460 Unspecified cirrhosis of liver: Secondary | ICD-10-CM | POA: Diagnosis not present

## 2020-10-01 ENCOUNTER — Other Ambulatory Visit: Payer: Self-pay | Admitting: Hematology & Oncology

## 2020-10-01 DIAGNOSIS — M3119 Other thrombotic microangiopathy: Secondary | ICD-10-CM

## 2020-10-05 ENCOUNTER — Other Ambulatory Visit: Payer: Self-pay | Admitting: Obstetrics & Gynecology

## 2020-10-05 DIAGNOSIS — Z1231 Encounter for screening mammogram for malignant neoplasm of breast: Secondary | ICD-10-CM

## 2020-10-06 ENCOUNTER — Other Ambulatory Visit: Payer: Self-pay | Admitting: Hematology & Oncology

## 2020-10-06 ENCOUNTER — Other Ambulatory Visit: Payer: Self-pay

## 2020-10-06 ENCOUNTER — Ambulatory Visit: Payer: Federal, State, Local not specified - PPO | Admitting: Family Medicine

## 2020-10-06 ENCOUNTER — Other Ambulatory Visit: Payer: Self-pay | Admitting: Sports Medicine

## 2020-10-06 ENCOUNTER — Encounter: Payer: Self-pay | Admitting: Family Medicine

## 2020-10-06 VITALS — BP 124/70 | HR 85 | Temp 97.9°F | Ht 61.0 in | Wt 181.0 lb

## 2020-10-06 DIAGNOSIS — L0293 Carbuncle, unspecified: Secondary | ICD-10-CM | POA: Diagnosis not present

## 2020-10-06 DIAGNOSIS — E119 Type 2 diabetes mellitus without complications: Secondary | ICD-10-CM

## 2020-10-06 DIAGNOSIS — K068 Other specified disorders of gingiva and edentulous alveolar ridge: Secondary | ICD-10-CM | POA: Diagnosis not present

## 2020-10-06 DIAGNOSIS — L0291 Cutaneous abscess, unspecified: Secondary | ICD-10-CM

## 2020-10-06 LAB — POCT GLYCOSYLATED HEMOGLOBIN (HGB A1C): Hemoglobin A1C: 7.2 % — AB (ref 4.0–5.6)

## 2020-10-06 MED ORDER — DOXYCYCLINE HYCLATE 100 MG PO TABS: 100.0000 mg | ORAL_TABLET | Freq: Two times a day (BID) | ORAL | 0 refills | Status: AC

## 2020-10-06 NOTE — Progress Notes (Signed)
Subjective:     Vicki [name] is a 60 y.o. female presenting for Diabetes     HPI  #Diabetes - could not take ozempic  Currently taking januvia  Using medications without difficulties: yes Hypoglycemic episodes:No  Hyperglycemic episodes:No  Feet problems:No  Blood Sugars averaging: 130-180 Last HgbA1c:  Lab Results  Component Value Date   HGBA1C 7.2 (A) 10/06/2020    Diabetes Health Maintenance Due:    Diabetes Health Maintenance Due  Topic Date Due   FOOT EXAM  08/14/2020   OPHTHALMOLOGY EXAM  01/13/2021   HEMOGLOBIN A1C  04/08/2021   URINE MICROALBUMIN  08/06/2021    #gum bleeding - within the last month - not sure when - lasted for a couple of days - noticed some bruising - no additional bleeding since then   #HTN - has noticed some high bp at home - does sit down - but does not rest prior to this  #Boil - has 5 boils currently  - in the groin area - have not drained - treatment - iodine, cortisone cream, polysporin and tylenol for pain, warm compresses   Review of Systems   Social History   Tobacco Use  Smoking Status Never  Smokeless Tobacco Never        Objective:    BP Readings from Last 3 Encounters:  10/06/20 124/70  08/06/20 126/64  07/30/20 (!) 149/77   Wt Readings from Last 3 Encounters:  10/06/20 181 lb (82.1 kg)  08/06/20 179 lb (81.2 kg)  07/30/20 176 lb 6.4 oz (80 kg)    BP 124/70   Pulse 85   Temp 97.9 F (36.6 C) (Temporal)   Ht 5' 1"  (1.549 m)   Wt 181 lb (82.1 kg)   LMP 04/30/2012   SpO2 97%   BMI 34.20 kg/m    Physical Exam Constitutional:      General: She is not in acute distress.    Appearance: She is well-developed. She is not diaphoretic.  HENT:     Right Ear: External ear normal.     Left Ear: External ear normal.     Nose: Nose normal.  Eyes:     Conjunctiva/sclera: Conjunctivae normal.  Cardiovascular:     Rate and Rhythm: Normal rate and regular rhythm.  Pulmonary:     Effort:  Pulmonary effort is normal.     Breath sounds: Normal breath sounds.  Musculoskeletal:     Cervical back: Neck supple.  Skin:    General: Skin is warm and dry.     Capillary Refill: Capillary refill takes less than 2 seconds.     Comments: Left inner thigh - large area of induration with overlying small fluctuant lesion which is ttp and erythematous.  Right labia majora - small fluctuant lesion with underlying induration Right thigh - non-fluctuant lesion with red ulcer  Neurological:     Mental Status: She is alert. Mental status is at baseline.  Psychiatric:        Mood and Affect: Mood normal.        Behavior: Behavior normal.          Assessment & Plan:   Problem List Items Addressed This Visit       Endocrine   Controlled type 2 diabetes mellitus without complication ([place]) - Primary (Chronic)    Lab Results  Component Value Date   HGBA1C 7.2 (A) 10/06/2020  Slightly worse, but difficulty tolerating medications. Cont [place]. Cont diabetic diet. Return 3 months  Relevant Orders   POCT glycosylated hemoglobin (Hb A1C) (Completed)     Other   Recurrent boils    Did see dermatology in the past with concern for Hidradenitis suppurativa. Will drain today for comfort and treat with abx to help cover. Referral back to dermatology as recurrent lesion which was previously drained and suspect she may benefit from alternative treatment plan.        Relevant Medications   doxycycline (VIBRA-TABS) 100 MG tablet   Other Relevant Orders   Ambulatory referral to Dermatology   Gums, bleeding    Symptoms improved. Will check platelets as low in the past to assess.        Relevant Orders   CBC   Other Visit Diagnoses     Abscess          I&D x 2  Meds, vitals, and allergies reviewed.   Indication: suspect abscess  Pt complaints of: erythema, pain, swelling  Location: Left inner thigh, Right labia majora  Size: left thigh - 1 cm with 4 cm of induration,  right labia - 5 mm  Informed consent obtained.  Pt aware of risks not limited to but including infection, bleeding, damage to near by organs.  Prep: Chloroprep  Anesthesia: 1%lidocaine with epi, good effect  Left thigh: Thin layer of skin with evacuation of purulent drainage with just anesthesia needle  Right labia: Incision made with #11 blade with drainage of purulent drainage   Tolerated well  Given small size, pt advised to use warm compress and wounds not packed.   Due to Diabetes and high risk course of antibiotics prescribed   Return in about 3 months (around 01/06/2021) for diabetes.  Lesleigh Noe, MD  This visit occurred during the SARS-CoV-2 public health emergency.  Safety protocols were in place, including screening questions prior to the visit, additional usage of staff PPE, and extensive cleaning of exam room while observing appropriate contact time as indicated for disinfecting solutions.

## 2020-10-06 NOTE — Assessment & Plan Note (Signed)
Did see dermatology in the past with concern for Hidradenitis suppurativa. Will drain today for comfort and treat with abx to help cover. Referral back to dermatology as recurrent lesion which was previously drained and suspect she may benefit from alternative treatment plan.

## 2020-10-06 NOTE — Telephone Encounter (Signed)
Please advise 

## 2020-10-06 NOTE — Assessment & Plan Note (Signed)
Symptoms improved. Will check platelets as low in the past to assess.

## 2020-10-06 NOTE — Assessment & Plan Note (Signed)
Lab Results  Component Value Date   HGBA1C 7.2 (A) 10/06/2020   Slightly worse, but difficulty tolerating medications. Cont [place]. Cont diabetic diet. Return 3 months

## 2020-10-06 NOTE — Patient Instructions (Addendum)
#  Diabetes - continue to work on diet - continue Januvia  #Boils - referral to Dermatology - antibiotics - call if worsening and can work to expidite the dermatology referral - warm compresses

## 2020-10-07 ENCOUNTER — Telehealth: Payer: Self-pay | Admitting: Family Medicine

## 2020-10-07 ENCOUNTER — Encounter: Payer: Self-pay | Admitting: Hematology & Oncology

## 2020-10-07 LAB — CBC
HCT: 38.9 % (ref 36.0–46.0)
Hemoglobin: 13 g/dL (ref 12.0–15.0)
MCHC: 33.3 g/dL (ref 30.0–36.0)
MCV: 91.1 fl (ref 78.0–100.0)
Platelets: 121 10*3/uL — ABNORMAL LOW (ref 150.0–400.0)
RBC: 4.27 Mil/uL (ref 3.87–5.11)
RDW: 13.3 % (ref 11.5–15.5)
WBC: 5.8 10*3/uL (ref 4.0–10.5)

## 2020-10-07 NOTE — Telephone Encounter (Signed)
Patient returned call . Would like a call back [phone]

## 2020-10-08 NOTE — Telephone Encounter (Signed)
Patient returned call . Stated detailed message can be left on voice mail

## 2020-10-08 NOTE — Telephone Encounter (Signed)
Completed in result note.

## 2020-10-20 ENCOUNTER — Ambulatory Visit: Payer: Federal, State, Local not specified - PPO | Admitting: Nurse Practitioner

## 2020-10-28 ENCOUNTER — Other Ambulatory Visit: Payer: Self-pay | Admitting: Hematology & Oncology

## 2020-10-28 DIAGNOSIS — M3119 Other thrombotic microangiopathy: Secondary | ICD-10-CM

## 2020-11-12 ENCOUNTER — Telehealth: Payer: Self-pay | Admitting: Family Medicine

## 2020-11-12 NOTE — Telephone Encounter (Signed)
Left message to return call to our office.  

## 2020-11-12 NOTE — Telephone Encounter (Signed)
Patient called stated her blood sugar has been up in the 200 and had medication concern . Patient transfer to Triage nurse

## 2020-11-12 NOTE — Telephone Encounter (Signed)
Let her know that Dr. Einar Pheasant is out of the office.  Will need some additional information regarding her blood sugars.  Please have her provide [place] with blood sugar readings over the last few weeks.  Also make sure that postmeal blood sugar readings are at least 2 hours after eating.  Also, did she have problems with Trulicity previously?

## 2020-11-12 NOTE — Telephone Encounter (Signed)
Sending note to Dr Einar Pheasant who is out of office and Allie Bossier NP who is in office and [place] CMA. Will also send teams to [facility].

## 2020-11-12 NOTE — Telephone Encounter (Signed)
[facility] - Client TELEPHONE ADVICE RECORD [facility] Patient Name: [facility] Shea DEN ISE GOOLSB Y Gender: Female DOB: [birth year] Age: 60 Y 2 M 4 D Return Phone Number: [phone] (Primary), [phone] (Secondary) Address: City/ State/ Zip: [place] [address] Client [facility] - Client Client Site [place] - Day Physician [name]- MD Contact Type Call Who Is Calling Patient / Member / Family / Caregiver Call Type Triage / Clinical Relationship To Patient Self Return Phone Number [phone] (Secondary) Chief Complaint Blood Sugar High Reason for Call Symptomatic / Request for Health Information Initial Comment Caller states blood sugar is 211 at 1:30 today. There is not appointments today and they are closed tomorrow. She is looking for some advice. Requesting to start again on low dose of Trulicity. Translation No Nurse Assessment Nurse: Doyle Askew, RN, Beth Date/Time (Eastern Time): 11/12/2020 2:13:46 PM Confirm and document reason for call. If symptomatic, describe symptoms. ---Caller states blood sugar is 211 at 1:30 today. There is not appointments today and they are closed tomorrow. Caller is looking for some advice. Requesting to start again on low dose of Trulicity. Does the patient have any new or worsening symptoms? ---Yes Will a triage be completed? ---Yes Related visit to physician within the last 2 weeks? ---No Does the PT have any chronic conditions? (i.e. diabetes, asthma, this includes High risk factors for pregnancy, etc.) ---Yes List chronic conditions. ---diabetes, TTP, NASH Is this a behavioral health or substance abuse call? ---No Guidelines Guideline Title Affirmed Question Affirmed Notes Nurse Date/Time (Eastern Time) Diabetes - High Blood Sugar Blood glucose 70-240 mg/dL (3.9 -13.3 mmol/L) Doyle Askew, RN, [facility] 11/12/2020 2:14:53 PM Disp. Time  Eilene Ghazi Time) Disposition Final User 11/12/2020 2:20:22 PM Home Care Yes Doyle Askew, RN, Beth PLEASE NOTE: All timestamps contained within this report are represented as [place] Standard Time. CONFIDENTIALTY NOTICE: This fax transmission is intended only for the addressee. It contains information that is legally privileged, confidential or otherwise protected from use or disclosure. If you are not the intended recipient, you are strictly prohibited from reviewing, disclosing, copying using or disseminating any of this information or taking any action in reliance on or regarding this information. If you have received this fax in error, please notify us immediately by telephone so that we can arrange for its return to [place]. Phone: [phone], Toll-Free: [phone], Fax: [phone] Page: 2 of 2 Call Id: [id number] Caller Disagree/Comply Comply Caller Understands Yes PreDisposition Call Doctor Care Advice Given Per Guideline HOME CARE: * You should be able to treat this at home. GENERAL DIABETES ADVICE: * Medical check-ups: See your doctor (or NP/PA) regularly. * Record-keeping: Keep a daily log of how you are feeling and the results of your tests. * Pre-prandial (before meal): 80-130 mg/dL (4.4-7.2 mmol/L) * Post-prandial (1-2 hours after a meal): Less than 180 mg/dL (10 mmol/L) DAILY BLOOD GLUCOSE GOALS: EXPECTED COURSE: * You should call back in 3 to 5 days if any of the following happen. * Your blood sugar continues to get above 240 mg/dL (13.3 mmol/L). * Your blood sugar continues to be higher than your daily glucose goals set by you and your doctor (or NP/PA). * It has been longer than 6 months since you had a Hemoglobin A1C test. CALL BACK IF: * Contributing factors: Not taking medicines as prescribed, eating a high calorie or high sugar diet, taking steroid medicines, and infection. HIGH BLOOD SUGAR (HYPERGLYCEMIA):

## 2020-11-13 ENCOUNTER — Other Ambulatory Visit: Payer: Self-pay | Admitting: Family Medicine

## 2020-11-13 DIAGNOSIS — E119 Type 2 diabetes mellitus without complications: Secondary | ICD-10-CM

## 2020-11-16 NOTE — Telephone Encounter (Signed)
Please f/u with pt regarding sugars and Kate's question from previous note

## 2020-11-16 NOTE — Telephone Encounter (Signed)
Patient called in to schedule appointment for blood sugars concerns

## 2020-11-17 DIAGNOSIS — L02224 Furuncle of groin: Secondary | ICD-10-CM | POA: Diagnosis not present

## 2020-11-17 DIAGNOSIS — B9689 Other specified bacterial agents as the cause of diseases classified elsewhere: Secondary | ICD-10-CM | POA: Diagnosis not present

## 2020-11-18 NOTE — Telephone Encounter (Signed)
Pt scheduled for 11/19/20

## 2020-11-19 ENCOUNTER — Other Ambulatory Visit: Payer: Self-pay

## 2020-11-19 ENCOUNTER — Ambulatory Visit (INDEPENDENT_AMBULATORY_CARE_PROVIDER_SITE_OTHER): Payer: Federal, State, Local not specified - PPO | Admitting: Family Medicine

## 2020-11-19 VITALS — BP 142/78 | HR 79 | Temp 97.0°F | Wt 180.2 lb

## 2020-11-19 DIAGNOSIS — K7581 Nonalcoholic steatohepatitis (NASH): Secondary | ICD-10-CM | POA: Diagnosis not present

## 2020-11-19 DIAGNOSIS — K7460 Unspecified cirrhosis of liver: Secondary | ICD-10-CM

## 2020-11-19 DIAGNOSIS — L0293 Carbuncle, unspecified: Secondary | ICD-10-CM | POA: Diagnosis not present

## 2020-11-19 DIAGNOSIS — E119 Type 2 diabetes mellitus without complications: Secondary | ICD-10-CM

## 2020-11-19 LAB — COMPREHENSIVE METABOLIC PANEL
ALT: 20 U/L (ref 0–35)
AST: 28 U/L (ref 0–37)
Albumin: 4 g/dL (ref 3.5–5.2)
Alkaline Phosphatase: 122 U/L — ABNORMAL HIGH (ref 39–117)
BUN: 11 mg/dL (ref 6–23)
CO2: 29 mEq/L (ref 19–32)
Calcium: 10.1 mg/dL (ref 8.4–10.5)
Chloride: 102 mEq/L (ref 96–112)
Creatinine, Ser: 0.85 mg/dL (ref 0.40–1.20)
GFR: 74.06 mL/min (ref >60.00)
Glucose, Bld: 261 mg/dL — ABNORMAL HIGH (ref 70–99)
Potassium: 3.9 mEq/L (ref 3.5–5.1)
Sodium: 137 mEq/L (ref 135–145)
Total Bilirubin: 1 mg/dL (ref 0.2–1.2)
Total Protein: 7.3 g/dL (ref 6.0–8.3)

## 2020-11-19 MED ORDER — TRULICITY 0.75 MG/0.5ML ~~LOC~~ SOAJ: 0.7500 mg | SUBCUTANEOUS | 1 refills | Status: DC

## 2020-11-19 NOTE — Assessment & Plan Note (Signed)
Repeat LFTs today. She is starting minocycline with derm and would like labs check due to liver risk.

## 2020-11-19 NOTE — Patient Instructions (Signed)
Stop Januvia Start Trulicity Check sugars Update with fasting sugars in 2 weeks  If greater than >140 will need to add Jardiance

## 2020-11-19 NOTE — Assessment & Plan Note (Signed)
Elevated CBG at home. Discussed no benefit from adding Trulicity to Januvia but could add Jardiance - pt concerned about risk of UTI/yeast infection. She will switch to Trulicity and work on diet. Update in 2 weeks. If no improvement will add Jardiance for improved control.

## 2020-11-19 NOTE — Assessment & Plan Note (Signed)
Following with dermatology. Discussed that the boils may be 2/2 to high sugars. Work on glucose control. Check labs and anticipate starting antibiotics as prescribed. Appreciate derm support

## 2020-11-19 NOTE — Progress Notes (Signed)
Subjective:     Vicki [name] is a 60 y.o. female presenting for Follow-up (Blood sugars elevated )     HPI  #Diabetes - home monitoring 150-220, generally in the 180 - Side effects: Metformin and ozempic  #Recurrent boils - saw dermatology - is on minocycline - waiting to check liver levels  Review of Systems   11/12/2020: elevated blood sugar to 211 08/31/2020: stop ozempic due to side effects 08/06/2020: Clinic - start ozempic 10/06/2020: Clinic - Hgba1c 7.2 - Januvia, difficulty tolerating medications - work on diet   Social History   Tobacco Use  Smoking Status Never  Smokeless Tobacco Never        Objective:    BP Readings from Last 3 Encounters:  11/19/20 (!) 142/78  10/06/20 124/70  08/06/20 126/64   Wt Readings from Last 3 Encounters:  11/19/20 180 lb 4 oz (81.8 kg)  10/06/20 181 lb (82.1 kg)  08/06/20 179 lb (81.2 kg)    BP (!) 142/78   Pulse 79   Temp (!) 97 F (36.1 C) (Temporal)   Wt 180 lb 4 oz (81.8 kg)   LMP 04/30/2012   SpO2 97%   BMI 34.06 kg/m    Physical Exam Constitutional:      General: She is not in acute distress.    Appearance: She is well-developed. She is not diaphoretic.  HENT:     Right Ear: External ear normal.     Left Ear: External ear normal.  Eyes:     Conjunctiva/sclera: Conjunctivae normal.  Cardiovascular:     Rate and Rhythm: Normal rate and regular rhythm.     Heart sounds: No murmur heard. Pulmonary:     Effort: Pulmonary effort is normal. No respiratory distress.     Breath sounds: Normal breath sounds. No wheezing.  Musculoskeletal:     Cervical back: Neck supple.  Skin:    General: Skin is warm and dry.     Capillary Refill: Capillary refill takes less than 2 seconds.  Neurological:     Mental Status: She is alert. Mental status is at baseline.  Psychiatric:        Mood and Affect: Mood normal.        Behavior: Behavior normal.          Assessment & Plan:   Problem List Items  Addressed This Visit       Digestive   Liver cirrhosis secondary to NASH ([place]) - Primary (Chronic)    Repeat LFTs today. She is starting minocycline with derm and would like labs check due to liver risk.         Endocrine   Controlled type 2 diabetes mellitus without complication ([facility]) (Chronic)    Elevated CBG at home. Discussed no benefit from adding Trulicity to Januvia but could add Jardiance - pt concerned about risk of UTI/yeast infection. She will switch to Trulicity and work on diet. Update in 2 weeks. If no improvement will add Jardiance for improved control.       Relevant Medications   Dulaglutide (TRULICITY) 0.17 PZ/0.2HE SOPN   Other Relevant Orders   Comprehensive metabolic panel     Other   Recurrent boils    Following with dermatology. Discussed that the boils may be 2/2 to high sugars. Work on glucose control. Check labs and anticipate starting antibiotics as prescribed. Appreciate derm support        Return in about 7 weeks (around 01/06/2021) for diabetes f/u.  Janett Billow  Karie Soda, MD  This visit occurred during the SARS-CoV-2 public health emergency.  Safety protocols were in place, including screening questions prior to the visit, additional usage of staff PPE, and extensive cleaning of exam room while observing appropriate contact time as indicated for disinfecting solutions.

## 2020-11-25 ENCOUNTER — Ambulatory Visit
Admission: RE | Admit: 2020-11-25 | Discharge: 2020-11-25 | Disposition: A | Discharge status: Home / Self Care | Payer: Federal, State, Local not specified - PPO | Source: Ambulatory Visit | Attending: Obstetrics & Gynecology | Admitting: Obstetrics & Gynecology

## 2020-11-25 ENCOUNTER — Other Ambulatory Visit: Payer: Self-pay

## 2020-11-25 DIAGNOSIS — Z1231 Encounter for screening mammogram for malignant neoplasm of breast: Secondary | ICD-10-CM | POA: Diagnosis not present

## 2020-11-30 ENCOUNTER — Other Ambulatory Visit: Payer: Self-pay

## 2020-11-30 ENCOUNTER — Encounter: Payer: Self-pay | Admitting: Hematology & Oncology

## 2020-11-30 ENCOUNTER — Inpatient Hospital Stay: Payer: Federal, State, Local not specified - PPO | Admitting: Hematology & Oncology

## 2020-11-30 ENCOUNTER — Inpatient Hospital Stay: Payer: Federal, State, Local not specified - PPO | Attending: Hematology & Oncology

## 2020-11-30 VITALS — BP 141/67 | HR 82 | Temp 98.3°F | Resp 18 | Ht 61.0 in | Wt 182.4 lb

## 2020-11-30 DIAGNOSIS — M3119 Other thrombotic microangiopathy: Secondary | ICD-10-CM | POA: Diagnosis not present

## 2020-11-30 DIAGNOSIS — E119 Type 2 diabetes mellitus without complications: Secondary | ICD-10-CM | POA: Diagnosis not present

## 2020-11-30 LAB — CMP (CANCER CENTER ONLY)
ALT: 28 U/L (ref 0–44)
AST: 39 U/L (ref 15–41)
Albumin: 4 g/dL (ref 3.5–5.0)
Alkaline Phosphatase: 122 U/L (ref 38–126)
Anion gap: 6 (ref 5–15)
BUN: 6 mg/dL — ABNORMAL LOW (ref 8–23)
CO2: 29 mmol/L (ref 22–32)
Calcium: 10 mg/dL (ref 8.9–10.3)
Chloride: 101 mmol/L (ref 98–111)
Creatinine: 0.84 mg/dL (ref 0.44–1.00)
GFR, Estimated: >60 mL/min (ref >60)
Glucose, Bld: 226 mg/dL — ABNORMAL HIGH (ref 70–99)
Potassium: 3.9 mmol/L (ref 3.5–5.1)
Sodium: 136 mmol/L (ref 135–145)
Total Bilirubin: 0.8 mg/dL (ref 0.3–1.2)
Total Protein: 7.3 g/dL (ref 6.5–8.1)

## 2020-11-30 LAB — CBC WITH DIFFERENTIAL (CANCER CENTER ONLY)
Abs Immature Granulocytes: 0 10*3/uL (ref 0.00–0.07)
Basophils Absolute: 0 10*3/uL (ref 0.0–0.1)
Basophils Relative: 1 %
Eosinophils Absolute: 0.2 10*3/uL (ref 0.0–0.5)
Eosinophils Relative: 5 %
HCT: 37.2 % (ref 36.0–46.0)
Hemoglobin: 12.7 g/dL (ref 12.0–15.0)
Immature Granulocytes: 0 %
Lymphocytes Relative: 41 %
Lymphs Abs: 1.6 10*3/uL (ref 0.7–4.0)
MCH: 30.3 pg (ref 26.0–34.0)
MCHC: 34.1 g/dL (ref 30.0–36.0)
MCV: 88.8 fL (ref 80.0–100.0)
Monocytes Absolute: 0.3 10*3/uL (ref 0.1–1.0)
Monocytes Relative: 6 %
Neutro Abs: 1.9 10*3/uL (ref 1.7–7.7)
Neutrophils Relative %: 47 %
Platelet Count: 125 10*3/uL — ABNORMAL LOW (ref 150–400)
RBC: 4.19 MIL/uL (ref 3.87–5.11)
RDW: 12.6 % (ref 11.5–15.5)
WBC Count: 4 10*3/uL (ref 4.0–10.5)
nRBC: 0 % (ref 0.0–0.2)

## 2020-11-30 LAB — SAVE SMEAR(SSMR), FOR PROVIDER SLIDE REVIEW

## 2020-11-30 LAB — LACTATE DEHYDROGENASE: LDH: 136 U/L (ref 98–192)

## 2020-11-30 NOTE — Progress Notes (Signed)
Hematology and Oncology Follow Up Visit  Vicki [name] [id number] 1960-06-23 60 y.o. 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  We last saw her back in May.  She has had a fairly quiet summer.  She really has had no problems since we last saw her.  She is gaining weight.  She has retired.  She wants to think about a part-time job to try to be little more active.  She said that she sees her hepatologist at [facility] next month.  She has had no problems with respect to relapse of the TTP.  She has had no issues with bleeding.  There is no change in bowel or bladder habits.  She has had no leg swelling.  Her last ADAMTS-13 back in May was greater than 100%.  Overall, I would have to say that her performance status is probably ECOG 1.    Medications:  Current Outpatient Medications:    acetaminophen (TYLENOL) 325 MG tablet, Take 650 mg by mouth every 6 (six) hours as needed., Disp: , Rfl:    Alum Hydroxide-Mag Carbonate (GAVISCON EXTRA STRENGTH PO), Take 1 tablet by mouth 4 (four) times daily as needed., Disp: , Rfl:    Ascorbic Acid (VITAMIN C) 500 MG CAPS, Take by mouth daily., Disp: , Rfl:    cholecalciferol (VITAMIN D3) 25 MCG (1000 UT) tablet, Take 1,000 Units by mouth daily., Disp: , Rfl:    Dulaglutide (TRULICITY) 1.65 VV/7.4MO SOPN, Inject 0.75 mg into the skin once a week., Disp: 6 mL, Rfl: 1   estradiol (ESTRACE) 0.1 MG/GM vaginal cream, 1 gram vaginally twice weekly, Disp: 42.5 g, Rfl: 6   fexofenadine (ALLEGRA) 180 MG tablet, Take 180 mg by mouth daily as needed for allergies or rhinitis., Disp: , Rfl:    folic acid (FOLVITE) 1 MG tablet, Take 2 tablets by mouth once daily, Disp: 60 tablet, Rfl: 0   furosemide (LASIX) 40  MG tablet, Take 1 tablet by mouth once daily, Disp: 30 tablet, Rfl: 0   glucose blood test strip, Use as instructed, Disp: 100 each, Rfl: 12   MAGNESIUM GLYCINATE PO, Take 200 mg by mouth daily., Disp: , Rfl:    mometasone (ELOCON) 0.1 % cream, APPLY  CREAM TOPICALLY ONCE DAILY, Disp: 45 g, Rfl: 0   Multiple Vitamins-Minerals (MULTI FOR HER 50+) TABS, Take 1 tablet by mouth daily., Disp: , Rfl:    potassium chloride (KLOR-CON) 10 MEQ tablet, TAKE 1  BY MOUTH ONCE DAILY, Disp: 30 tablet, Rfl: 0   senna-docusate (SENOKOT-S) 8.6-50 MG tablet, Take 2 tablets by mouth 2 (two) times daily., Disp: 20 tablet, Rfl: 0   vitamin B-12 (CYANOCOBALAMIN) 500 MCG tablet, Take 500 mcg by mouth every other day., Disp: , Rfl:    ZINC PICOLINATE PO, Take 30 mg by mouth daily., Disp: , Rfl:   Allergies:  Allergies  Allergen Reactions   Metformin Other (See Comments)   Other Other (See Comments)   Metformin And Related Itching    Heart palpitation   Sulfa Antibiotics Itching   Sulfasalazine Itching    Past Medical History, Surgical history, Social history, and Family History were reviewed and updated.  Review of Systems: Review of Systems  Constitutional: Negative.  HENT:  Negative.    Eyes: Negative.   Respiratory: Negative.    Cardiovascular:  Positive for leg swelling.  Gastrointestinal: Negative.   Endocrine: Negative.   Genitourinary: Negative.    Musculoskeletal:  Positive for arthralgias and myalgias.  Skin: Negative.   Neurological: Negative.   Hematological: Negative.   Psychiatric/Behavioral: Negative.     Physical Exam:  height is 5' 1"  (1.549 m) and weight is 182 lb 6.4 oz (82.7 kg). Her oral temperature is 98.3 F (36.8 C). Her blood pressure is 141/67 (abnormal) and her pulse is 82. Her respiration is 18 and oxygen saturation is 99%.   Wt Readings from Last 3 Encounters:  11/30/20 182 lb 6.4 oz (82.7 kg)  11/19/20 180 lb 4 oz (81.8 kg)  10/06/20 181 lb (82.1 kg)    Physical  Exam Vitals reviewed.  HENT:     Head: Normocephalic and atraumatic.  Eyes:     Pupils: Pupils are equal, round, and reactive to light.  Cardiovascular:     Rate and Rhythm: Normal rate and regular rhythm.     Heart sounds: Normal heart sounds.  Pulmonary:     Effort: Pulmonary effort is normal.     Breath sounds: Normal breath sounds.  Abdominal:     General: Bowel sounds are normal.     Palpations: Abdomen is soft.  Musculoskeletal:        General: No tenderness or deformity. Normal range of motion.     Cervical back: Normal range of motion.     Comments: Extremities shows some 1+ edema in her lower legs.  She has good range of motion of her joints.  She has good strength in upper and lower extremities.  Lymphadenopathy:     Cervical: No cervical adenopathy.  Skin:    General: Skin is warm and dry.     Findings: No erythema or rash.  Neurological:     Mental Status: She is alert and oriented to person, place, and time.  Psychiatric:        Behavior: Behavior normal.        Thought Content: Thought content normal.        Judgment: Judgment normal.     Lab Results  Component Value Date   WBC 4.0 11/30/2020   HGB 12.7 11/30/2020   HCT 37.2 11/30/2020   MCV 88.8 11/30/2020   PLT 125 (L) 11/30/2020     Chemistry      Component Value Date/Time   NA 136 11/30/2020 1323   K 3.9 11/30/2020 1323   CL 101 11/30/2020 1323   CO2 29 11/30/2020 1323   BUN 6 (L) 11/30/2020 1323   CREATININE 0.84 11/30/2020 1323   CREATININE 0.70 09/25/2015 1504      Component Value Date/Time   CALCIUM 10.0 11/30/2020 1323   ALKPHOS 122 11/30/2020 1323   AST 39 11/30/2020 1323   ALT 28 11/30/2020 1323   BILITOT 0.8 11/30/2020 1323      Impression and Plan: Vicki [name] is a 60 year old African-American female.  She has acquired TTP.  She had relapsed.  She responded well to treatment with plasma exchange and Rituxan.  She is now has been off treatment now for over 1 year.  Have to  believe that she is still in remission.  Clearly, her biggest problem is going be her diabetes.  I think this will continue to be an issue if she does not get it under better control.  I was at her blood  smear under the microscope.  I do not see any schistocytes.  I do not see any nucleated red blood cells.  She has adequate platelets and white blood cells.  I think we can plan to get her back after the holiday season now.  Hopefully, she will not have any problems with respect to the TTP.  Hopefully, she will lose little bit of weight and get her blood sugars under better control.   Volanda Napoleon, MD 9/26/20222:10 PM

## 2020-12-01 ENCOUNTER — Telehealth: Payer: Self-pay | Admitting: Hematology & Oncology

## 2020-12-01 LAB — ADAMTS13 ACTIVITY REFLEX

## 2020-12-01 LAB — ADAMTS13 ACTIVITY: Adamts 13 Activity: >100 % (ref >66.8)

## 2020-12-01 NOTE — Telephone Encounter (Signed)
Scheduled appt per 9/26 los - mailed letter with appt date and time for 4 month follow up

## 2020-12-03 ENCOUNTER — Telehealth: Payer: Self-pay | Admitting: *Deleted

## 2020-12-03 ENCOUNTER — Telehealth: Payer: Self-pay | Admitting: Family Medicine

## 2020-12-03 NOTE — Telephone Encounter (Signed)
[name] - Client TELEPHONE ADVICE RECORD [facility] Patient Name: Vicki [name] Gender: Female DOB: 20-Feb-1961 Age: 60 [name] 2 M 25 D Return Phone Number: [phone] (Primary), [phone] (Secondary) Address: City/ State/ Zip: [place] [address] Client Vicki [name] - Client Client Site [name] Physician [name]- MD Contact Type Call Who Is Calling Patient / Member / Family / Caregiver Call Type Triage / Clinical Relationship To Patient Self Return Phone Number [phone] (Secondary) Chief Complaint Abdominal Pain Reason for Call Symptomatic / Request for [place] states started on Trulicity. She states she has abdominal pain, diarrhea, and vomiting. Translation No Nurse Assessment Nurse: Fredderick Phenix, RN, Lelan Pons Date/Time Eilene Ghazi Time): 12/03/2020 11:58:36 AM Confirm and document reason for call. If symptomatic, describe symptoms. ---Caller states started on Trulicity for two weeks. She states she has occasional abdominal pain, diarrhea, and vomiting. It isn't happening every, just now and then. She feels fine otherwise. She just wanted to report these s/s to the doctor. Doesn't need triage or advice at this time. Does the patient have any new or worsening symptoms? ---No Disp. Time Eilene Ghazi Time) Disposition Final User 12/03/2020 12:02:25 PM Clinical Call Yes Fredderick Phenix, RN, Lelan Pons

## 2020-12-03 NOTE — Telephone Encounter (Signed)
Pt called stating that Dr Einar Pheasant would like to know her blood sugar levels.Marland KitchenMarland Kitchen9/19-156, 9/20-146, 9/21-137, 9/22-166, 9/23-130, 9/24-148, 9/25-124, 9/26-154, 9/27-170, 9/28-132, 9/29-135

## 2020-12-03 NOTE — Telephone Encounter (Signed)
Noted, mychart to patient.

## 2020-12-03 NOTE — Telephone Encounter (Signed)
Call received from patient wanting to know if she should take Methyl-folate instead of Folic Acid.  Dr. Marin Olp notified.  Call placed back to patient and message left to notify pt per order of Dr. Marin Olp to continue Folic Acid as prescribed.  Instructed pt to call office back with any questions or concerns.

## 2020-12-04 ENCOUNTER — Other Ambulatory Visit: Payer: Self-pay | Admitting: Hematology & Oncology

## 2020-12-04 DIAGNOSIS — M3119 Other thrombotic microangiopathy: Secondary | ICD-10-CM

## 2020-12-05 ENCOUNTER — Encounter: Payer: Self-pay | Admitting: Hematology & Oncology

## 2020-12-08 DIAGNOSIS — Z20822 Contact with and (suspected) exposure to covid-19: Secondary | ICD-10-CM | POA: Diagnosis not present

## 2020-12-30 DIAGNOSIS — K7460 Unspecified cirrhosis of liver: Secondary | ICD-10-CM | POA: Diagnosis not present

## 2020-12-30 DIAGNOSIS — K7581 Nonalcoholic steatohepatitis (NASH): Secondary | ICD-10-CM | POA: Diagnosis not present

## 2021-01-04 ENCOUNTER — Other Ambulatory Visit: Payer: Self-pay | Admitting: Hematology & Oncology

## 2021-01-04 DIAGNOSIS — M3119 Other thrombotic microangiopathy: Secondary | ICD-10-CM

## 2021-01-19 ENCOUNTER — Telehealth (INDEPENDENT_AMBULATORY_CARE_PROVIDER_SITE_OTHER): Payer: Federal, State, Local not specified - PPO | Admitting: Primary Care

## 2021-01-19 ENCOUNTER — Other Ambulatory Visit: Payer: Self-pay

## 2021-01-19 VITALS — Ht 61.0 in | Wt 182.0 lb

## 2021-01-19 DIAGNOSIS — H1033 Unspecified acute conjunctivitis, bilateral: Secondary | ICD-10-CM | POA: Diagnosis not present

## 2021-01-19 MED ORDER — POLYMYXIN B-TRIMETHOPRIM 10000-0.1 UNIT/ML-% OP SOLN: 1.0000 [drp] | Freq: Four times a day (QID) | OPHTHALMIC | 0 refills | Status: DC

## 2021-01-19 NOTE — Patient Instructions (Signed)
Start trimethoprim polymixin B drops. Instill 1 drop into each eye twice daily for 5-7 days. Use for an additional two days after symptoms resolve.   Please come see [place] if you do not notice improvement.  It was a pleasure to see you today!

## 2021-01-19 NOTE — Assessment & Plan Note (Signed)
Suspect bacterial or viral cause given HPI and roommate with same symptoms.   Rx for trimethoprim polymixin B drops sent to pharmacy. Discussed to continue for 2 days after symptom resolve.   Return precautions provided.

## 2021-01-19 NOTE — Progress Notes (Signed)
Patient ID: Vicki [name], female    DOB: [birth year], 60 y.o.   MRN: [id number]  Virtual visit completed through [employer], a video enabled telemedicine application. Due to national recommendations of social distancing due to COVID-19, a virtual visit is felt to be most appropriate for this patient at this time. Reviewed limitations, risks, security and privacy concerns of performing a virtual visit and the availability of in person appointments. I also reviewed that there may be a patient responsible charge related to this service. The patient agreed to proceed.   Patient location: home Provider location: [place] at [facility], office Persons participating in this virtual visit: patient, provider   If any vitals were documented, they were collected by patient at home unless specified below.    Ht 5' 1"  (1.549 m)   Wt 182 lb (82.6 kg)   LMP 04/30/2012   BMI 34.39 kg/m    CC: Eye drainage Subjective:   HPI: Vicki [name] is a 60 y.o. female patient of Dr. Einar Pheasant with a history of migraines, hypertension, type 2 diabetes, TTP, presenting on 01/19/2021 to discuss eye drainage.   Two days ago she developed drainage, redness, whitish "puss into the corners", left eye worse than right. Eyes are matted shut in the morning, whitish crust with a "film over my eye".   One week ago she developed cough, nasal congestion, scratchy throat. Symptoms have improved. She has not tested for Covid-19. Her roommate has had similar symptoms with her eyes, is now on a "eye drop".   She denies itching to the eyes, She's been using "pink eye" treatment OTC for a few days which has not helped.   She does not wear contact lenses.       Relevant past medical, surgical, family and social history reviewed and updated as indicated. Interim medical history since our last visit reviewed. Allergies and medications reviewed and updated. Outpatient Medications Prior to Visit  Medication Sig Dispense  Refill   acetaminophen (TYLENOL) 325 MG tablet Take 650 mg by mouth every 6 (six) hours as needed.     Alum Hydroxide-Mag Carbonate (GAVISCON EXTRA STRENGTH PO) Take 1 tablet by mouth 4 (four) times daily as needed.     Ascorbic Acid (VITAMIN C) 500 MG CAPS Take by mouth daily.     cholecalciferol (VITAMIN D3) 25 MCG (1000 UT) tablet Take 1,000 Units by mouth daily.     Dulaglutide (TRULICITY) 8.41 YS/0.6TK SOPN Inject 0.75 mg into the skin once a week. 6 mL 1   estradiol (ESTRACE) 0.1 MG/GM vaginal cream 1 gram vaginally twice weekly 42.5 g 6   fexofenadine (ALLEGRA) 180 MG tablet Take 180 mg by mouth daily as needed for allergies or rhinitis.     folic acid (FOLVITE) 1 MG tablet Take 2 tablets by mouth once daily 60 tablet 0   furosemide (LASIX) 40 MG tablet Take 1 tablet by mouth once daily 30 tablet 0   glucose blood test strip Use as instructed 100 each 12   MAGNESIUM GLYCINATE PO Take 200 mg by mouth daily.     mometasone (ELOCON) 0.1 % cream APPLY  CREAM TOPICALLY ONCE DAILY 45 g 0   Multiple Vitamins-Minerals (MULTI FOR HER 50+) TABS Take 1 tablet by mouth daily.     potassium chloride (KLOR-CON) 10 MEQ tablet TAKE 1  BY MOUTH ONCE DAILY 30 tablet 0   vitamin B-12 (CYANOCOBALAMIN) 500 MCG tablet Take 500 mcg by mouth every other day.  ZINC PICOLINATE PO Take 30 mg by mouth daily.     senna-docusate (SENOKOT-S) 8.6-50 MG tablet Take 2 tablets by mouth 2 (two) times daily. (Patient not taking: Reported on 01/19/2021) 20 tablet 0   No facility-administered medications prior to visit.     Per HPI unless specifically indicated in ROS section below Review of Systems  Constitutional:  Negative for fever.  HENT:  Positive for congestion.   Eyes:  Positive for discharge and redness. Negative for visual disturbance.       Film over eyes  Respiratory:  Positive for cough.   Objective:  Ht 5' 1"  (1.549 m)   Wt 182 lb (82.6 kg)   LMP 04/30/2012   BMI 34.39 kg/m   Wt Readings from  Last 3 Encounters:  01/19/21 182 lb (82.6 kg)  11/30/20 182 lb 6.4 oz (82.7 kg)  11/19/20 180 lb 4 oz (81.8 kg)       Physical exam: General: Alert and oriented x 3, no distress, does not appear sickly  Pulmonary: Speaks in complete sentences without increased work of breathing, dry cough noted once during exam.  Psychiatric: Normal mood, thought content, and behavior.  Eyes: Difficult to assess as her camera was mildly blurred. I did notice drainage to left eye, evidence of matting to left upper eyelid.      Results for orders placed or performed in visit on 11/30/20  CBC with Differential (Cancer Center Only)  Result Value Ref Range   WBC Count 4.0 4.0 - 10.5 K/uL   RBC 4.19 3.87 - 5.11 MIL/uL   Hemoglobin 12.7 12.0 - 15.0 g/dL   HCT 37.2 36.0 - 46.0 %   MCV 88.8 80.0 - 100.0 fL   MCH 30.3 26.0 - 34.0 pg   MCHC 34.1 30.0 - 36.0 g/dL   RDW 12.6 11.5 - 15.5 %   Platelet Count 125 (L) 150 - 400 K/uL   nRBC 0.0 0.0 - 0.2 %   Neutrophils Relative % 47 %   Neutro Abs 1.9 1.7 - 7.7 K/uL   Lymphocytes Relative 41 %   Lymphs Abs 1.6 0.7 - 4.0 K/uL   Monocytes Relative 6 %   Monocytes Absolute 0.3 0.1 - 1.0 K/uL   Eosinophils Relative 5 %   Eosinophils Absolute 0.2 0.0 - 0.5 K/uL   Basophils Relative 1 %   Basophils Absolute 0.0 0.0 - 0.1 K/uL   Immature Granulocytes 0 %   Abs Immature Granulocytes 0.00 0.00 - 0.07 K/uL  ADAMTS13 Activity  Result Value Ref Range   Adamts 13 Activity >100.0 >66.8 %  Lactate dehydrogenase  Result Value Ref Range   LDH 136 98 - 192 U/L  [facility])  Result Value Ref Range   Smear Review SMEAR STAINED AND AVAILABLE FOR REVIEW   CMP (Cancer Center only)  Result Value Ref Range   Sodium 136 135 - 145 mmol/L   Potassium 3.9 3.5 - 5.1 mmol/L   Chloride 101 98 - 111 mmol/L   CO2 29 22 - 32 mmol/L   Glucose, Bld 226 (H) 70 - 99 mg/dL   BUN 6 (L) 8 - 23 mg/dL   Creatinine 0.84 0.44 - 1.00 mg/dL   Calcium 10.0 8.9 - 10.3 mg/dL   Total  Protein 7.3 6.5 - 8.1 g/dL   Albumin 4.0 3.5 - 5.0 g/dL   AST 39 15 - 41 U/L   ALT 28 0 - 44 U/L   Alkaline Phosphatase 122 38 - 126 U/L  Total Bilirubin 0.8 0.3 - 1.2 mg/dL   GFR, Estimated >60 >60 mL/min   Anion gap 6 5 - 15  ADAMTS13 Activity reflex  Result Value Ref Range   ADAMTS13 Activity comment Comment    Assessment & Plan:   Problem List Items Addressed This Visit       Other   Acute conjunctivitis of both eyes - Primary    Suspect bacterial or viral cause given HPI and roommate with same symptoms.   Rx for trimethoprim polymixin B drops sent to pharmacy. Discussed to continue for 2 days after symptom resolve.   Return precautions provided.       Relevant Medications   trimethoprim-polymyxin b (POLYTRIM) ophthalmic solution     Meds ordered this encounter  Medications   trimethoprim-polymyxin b (POLYTRIM) ophthalmic solution    Sig: Place 1 drop into both eyes 4 (four) times daily. For 5 to 7 days.    Dispense:  10 mL    Refill:  0    Order Specific Question:   Supervising Provider    Answer:   BEDSOLE, AMY E [[id number]]   No orders of the defined types were placed in this encounter.   I discussed the assessment and treatment plan with the patient. The patient was provided an opportunity to ask questions and all were answered. The patient agreed with the plan and demonstrated an understanding of the instructions. The patient was advised to call back or seek an in-person evaluation if the symptoms worsen or if the condition fails to improve as anticipated.  Follow up plan:  Start trimethoprim polymixin B drops. Instill 1 drop into each eye twice daily for 5-7 days. Use for an additional two days after symptoms resolve.   Please come see [place] if you do not notice improvement.  It was a pleasure to see you today!   Pleas Koch, NP

## 2021-01-22 ENCOUNTER — Ambulatory Visit: Payer: Federal, State, Local not specified - PPO | Admitting: Obstetrics & Gynecology

## 2021-02-01 ENCOUNTER — Other Ambulatory Visit: Payer: Self-pay | Admitting: Hematology & Oncology

## 2021-02-02 IMAGING — XA IR FLUORO GUIDE CV LINE*R*
1 series · 1 of 1 positions shown · non-contrast
Comparison: none

INDICATION: 58-year-old with TTP needs a tunneled dialysis catheter for
plasmapheresis.

[Series 1: fl (-) angio · 1 of 1 slices shown]
[im 1/1]
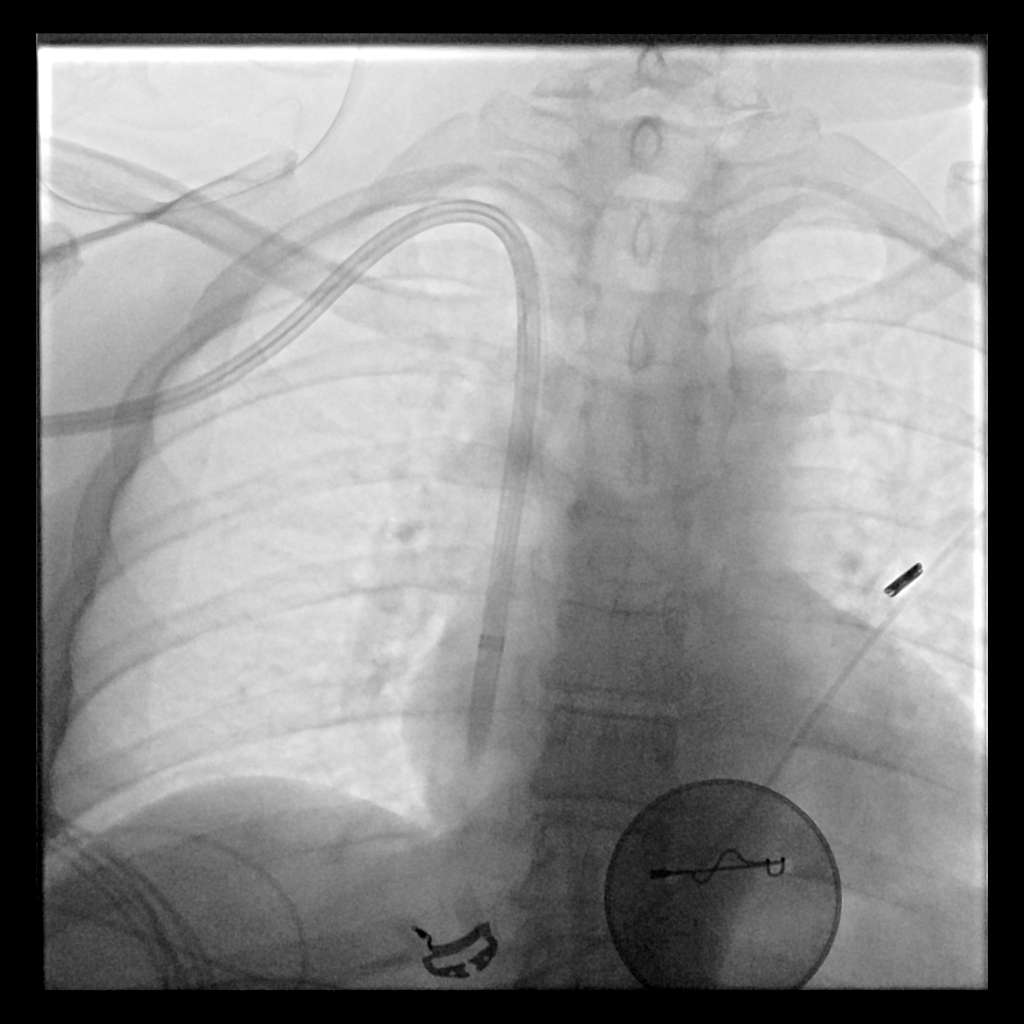

[1 of 1 positions shown; findings below may reference images not displayed]

EXAM:
FLUOROSCOPIC AND ULTRASOUND GUIDED PLACEMENT OF A TUNNELED DIALYSIS
CATHETER

MEDICATIONS:
Ancef 2 g; The antibiotic was administered within an appropriate
time interval prior to skin puncture.

ANESTHESIA/SEDATION:
Versed 1.0 mg IV; Fentanyl 50 mcg IV;

Moderate Sedation Time:  29 minutes

The patient was continuously monitored during the procedure by the
interventional radiology nurse under my direct supervision.

FLUOROSCOPY TIME:  Fluoroscopy Time: 18 seconds, 1 mGy

COMPLICATIONS:
None immediate.

PROCEDURE:
The procedure was explained to the patient. The risks and benefits
of the procedure were discussed and the patient's questions were
addressed. Informed consent was obtained from the patient. The
patient was placed supine on the interventional table. The existing
non tunneled right jugular catheter was removed with manual
compression. Ultrasound confirmed a patent right internal jugular
vein. Ultrasound images were obtained for documentation. The right
neck and chest was prepped and draped in a sterile fashion. The
right neck was anesthetized with 1% lidocaine. Maximal barrier
sterile technique was utilized including caps, mask, sterile gowns,
sterile gloves, sterile drape, hand hygiene and skin antiseptic. A
small incision was made with #11 blade scalpel. A 21 gauge needle
directed into the right internal jugular vein vein with ultrasound
guidance. A micropuncture dilator set was placed. A 19 cm tip to
cuff Palindrome catheter was selected. The skin below the right
clavicle was anesthetized and a small incision was made with an #11
blade scalpel. A subcutaneous tunnel was formed to the vein
dermatotomy site. The catheter was brought through the tunnel. The
vein dermatotomy site was dilated to accommodate a peel-away sheath.
The catheter was placed through the peel-away sheath and directed
into the central venous structures. The tip of the catheter was
placed in the right atrium with fluoroscopy. Fluoroscopic images
were obtained for documentation. Both lumens were found to aspirate
and flush well. The proper amount of heparin was flushed in both
lumens. The vein dermatotomy site was closed using a single layer of
absorbable suture and Dermabond. Gel-Foam was placed in the
subcutaneous tract. The catheter was secured to the skin using
Prolene suture.
IMPRESSION: Successful placement of a right jugular tunneled dialysis catheter
using ultrasound and fluoroscopic guidance.

## 2021-02-03 ENCOUNTER — Telehealth: Payer: Self-pay | Admitting: Hematology & Oncology

## 2021-02-03 DIAGNOSIS — M3119 Other thrombotic microangiopathy: Secondary | ICD-10-CM

## 2021-02-05 IMAGING — DX DG CHEST 2V
2 series · 2 of 2 positions shown · non-contrast
Comparison: 03/03/2019

CLINICAL DATA: Fever

EXAM:
CHEST - 2 VIEW

[chest pa]
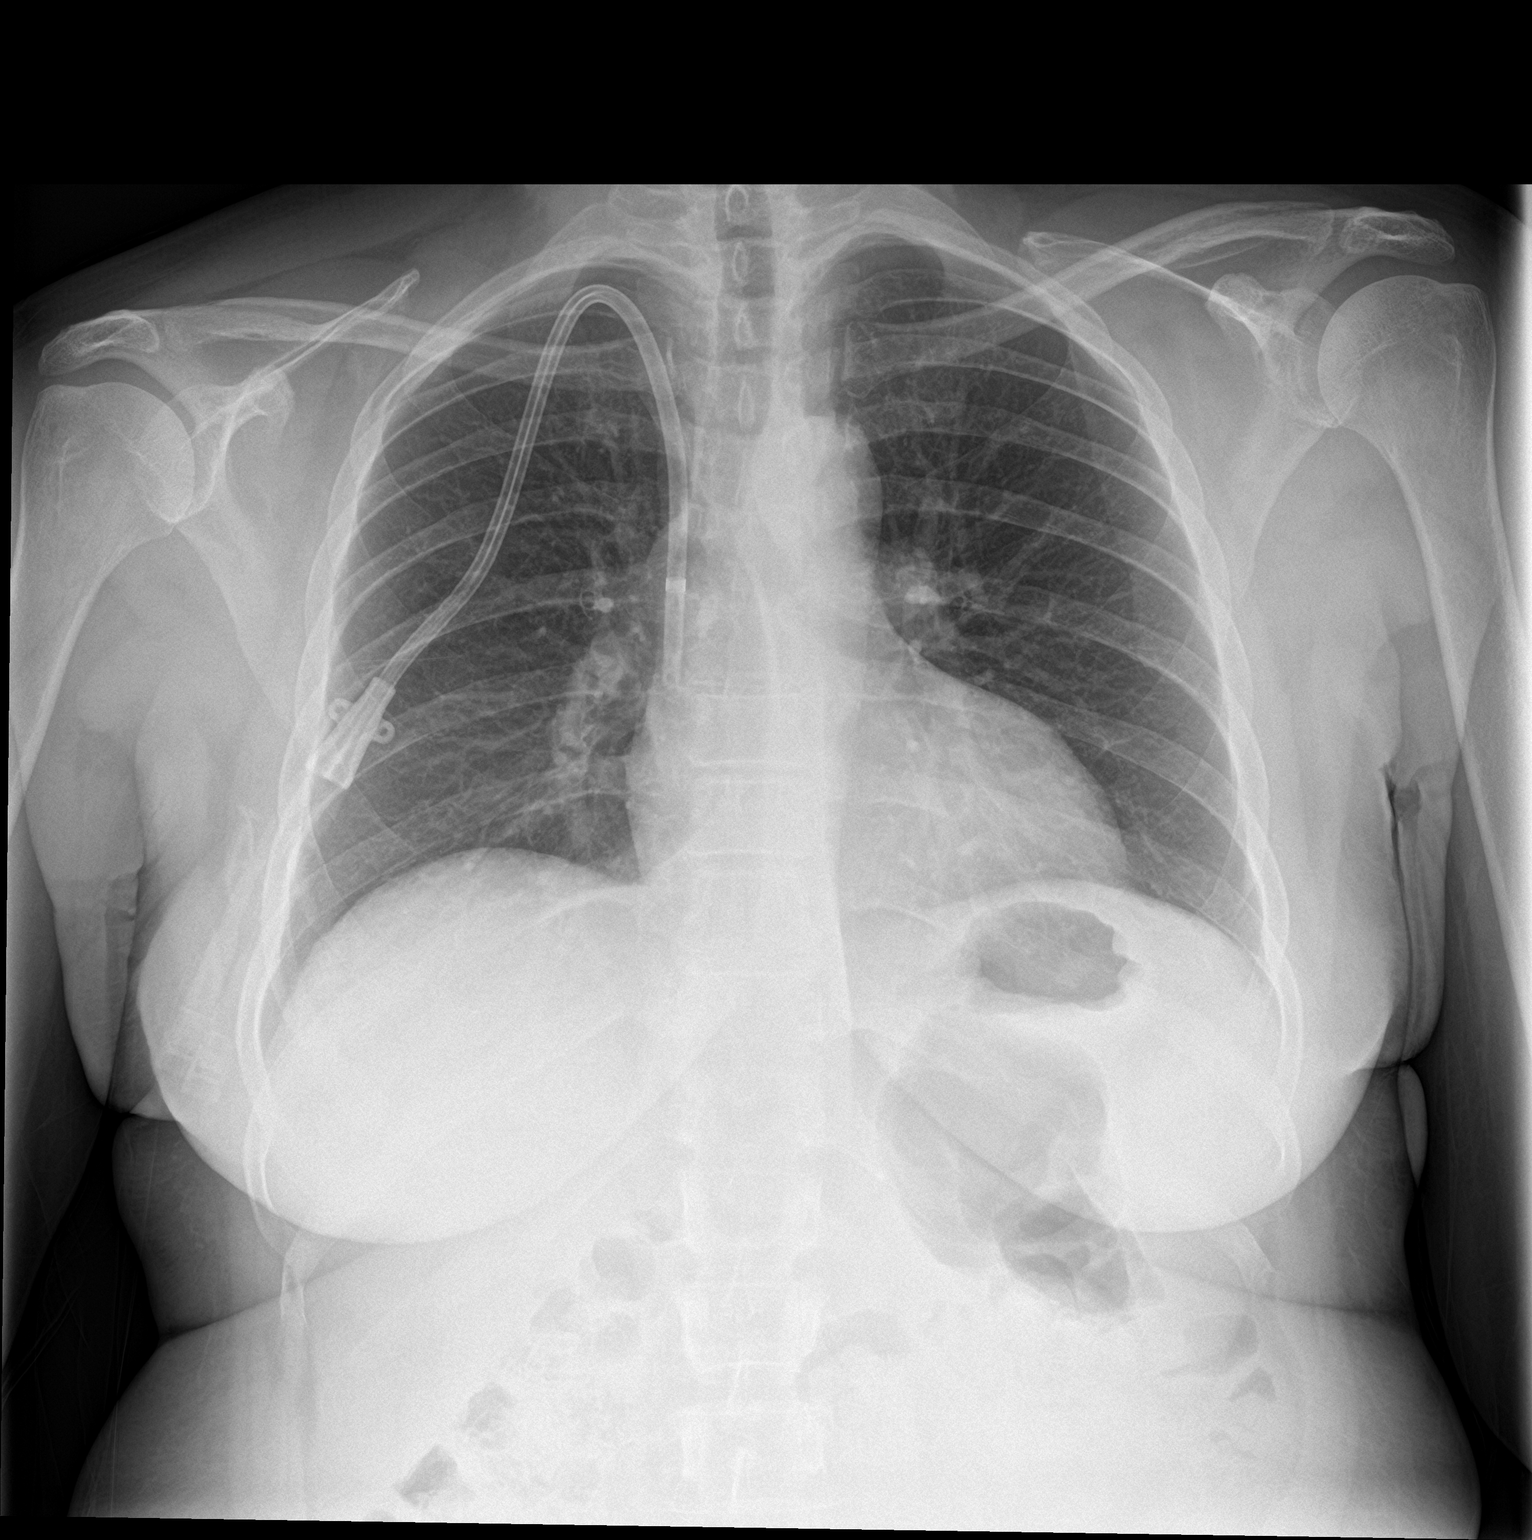

[chest lat]
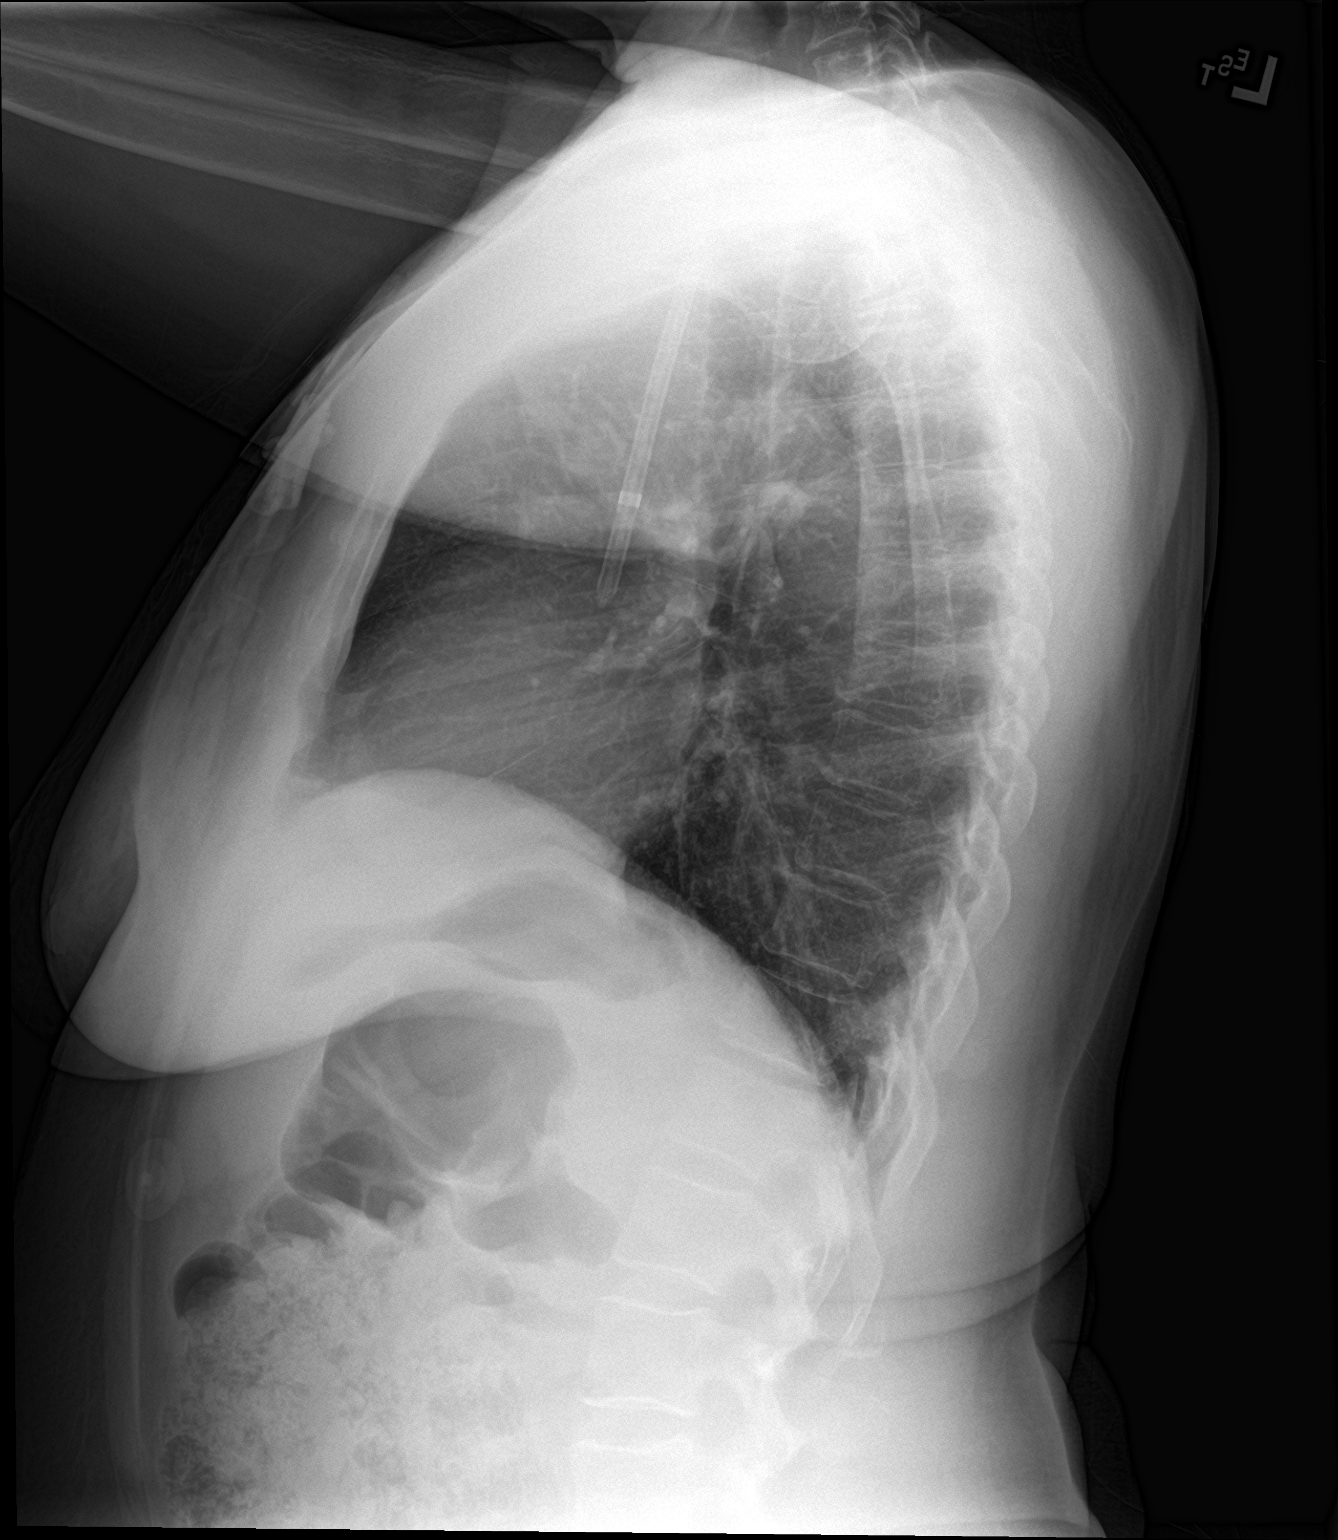

[2 of 2 positions shown; findings below may reference images not displayed]

FINDINGS: Interval placement of dual lumen right internal jugular approach
central venous catheter with distal tip terminating at the level of
the distal SVC. The heart size and mediastinal contours are within
normal limits. Both lungs are clear. The visualized skeletal
structures are unremarkable.
IMPRESSION: 1. No active cardiopulmonary disease.
2. Interval placement of right-sided hemodialysis catheter. No
pneumothorax.

## 2021-02-08 MED ORDER — GLUCOSE BLOOD VI STRP: ORAL_STRIP | 1 refills | Status: DC

## 2021-02-08 NOTE — Telephone Encounter (Signed)
Name of Medication: onetouch ultra blue test strips Name of Pharmacy: [facility] Date and Quantity: # 100 X 12 on 01/17/2020 Last Office Visit and Type: 01/19/21 acute visit and 11/19/20 FU Next Office Visit and Type: 04/13/2021  Refilled one touch ultra blue test strips # 100 x 1 to [facility]. Pt said BS doing better. 02/08/21 FBS was 135; pt is presently taking Trulicity.

## 2021-02-08 NOTE — Telephone Encounter (Signed)
  Encourage patient to contact the pharmacy for refills or they can request refills through [place]:  Please schedule appointment if longer than 1 year  NEXT APPOINTMENT DATE:  MEDICATION:glucose blood test strips  Is the patient out of medication? yes  PHARMACY:[facility]  Let patient know to contact pharmacy at the end of the day to make sure medication is ready.  Please notify patient to allow 48-72 hours to process  CLINICAL FILLS OUT ALL BELOW:   LAST REFILL:  QTY:  REFILL DATE:    OTHER COMMENTS:    Okay for refill?  Please advise

## 2021-02-23 ENCOUNTER — Encounter: Payer: Self-pay | Admitting: Family Medicine

## 2021-03-01 ENCOUNTER — Other Ambulatory Visit: Payer: Self-pay | Admitting: Hematology & Oncology

## 2021-03-01 DIAGNOSIS — M3119 Other thrombotic microangiopathy: Secondary | ICD-10-CM

## 2021-03-02 ENCOUNTER — Encounter: Payer: Self-pay | Admitting: Hematology & Oncology

## 2021-03-03 ENCOUNTER — Other Ambulatory Visit: Payer: Self-pay | Admitting: Hematology & Oncology

## 2021-03-03 DIAGNOSIS — M3119 Other thrombotic microangiopathy: Secondary | ICD-10-CM

## 2021-03-15 ENCOUNTER — Other Ambulatory Visit: Payer: Self-pay | Admitting: Hematology & Oncology

## 2021-03-15 ENCOUNTER — Telehealth: Payer: Self-pay | Admitting: Hematology & Oncology

## 2021-03-18 ENCOUNTER — Ambulatory Visit: Payer: Federal, State, Local not specified - PPO | Admitting: Family

## 2021-03-18 ENCOUNTER — Encounter: Payer: Self-pay | Admitting: Family

## 2021-03-18 ENCOUNTER — Other Ambulatory Visit: Payer: Self-pay

## 2021-03-18 VITALS — BP 134/76 | HR 86 | Temp 97.2°F | Ht 61.0 in | Wt 185.0 lb

## 2021-03-18 DIAGNOSIS — R82998 Other abnormal findings in urine: Secondary | ICD-10-CM | POA: Diagnosis not present

## 2021-03-18 DIAGNOSIS — E559 Vitamin D deficiency, unspecified: Secondary | ICD-10-CM | POA: Diagnosis not present

## 2021-03-18 DIAGNOSIS — K7581 Nonalcoholic steatohepatitis (NASH): Secondary | ICD-10-CM | POA: Diagnosis not present

## 2021-03-18 DIAGNOSIS — R350 Frequency of micturition: Secondary | ICD-10-CM

## 2021-03-18 DIAGNOSIS — M3119 Other thrombotic microangiopathy: Secondary | ICD-10-CM

## 2021-03-18 DIAGNOSIS — E119 Type 2 diabetes mellitus without complications: Secondary | ICD-10-CM

## 2021-03-18 DIAGNOSIS — E538 Deficiency of other specified B group vitamins: Secondary | ICD-10-CM | POA: Diagnosis not present

## 2021-03-18 DIAGNOSIS — K7460 Unspecified cirrhosis of liver: Secondary | ICD-10-CM

## 2021-03-18 DIAGNOSIS — R0602 Shortness of breath: Secondary | ICD-10-CM

## 2021-03-18 NOTE — Progress Notes (Signed)
Established Patient Office Visit  Subjective:  Patient ID: Vicki [name], female    DOB: [birth year]  Age: 61 y.o. MRN: [id number]  CC: No chief complaint on file.   HPI Vicki [name] is here today for follow up appt.    DM2: trulicity 0.10 mg once weekly, compliant. Does have some urinary frequency and urgency, unable to hold her urine at times, has been noticing this over the last one month. Has appt with gyn next month. She doesn't really exercise. She does try to check her feet daily to make sure no infection. Last eye exam was over one year ago. Does state eating was a little 'off' but trying to pay attention to making sure she doesn't overeat carbs and sugars.   Poct in office 6.8, today 03/18/2021  Lab Results  Component Value Date   HGBA1C 7.2 (A) 10/06/2020   Patient followed closely by hematology and has scheduled follow-up appointment with him.  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 She does have some increase sob in the last few weeks, and more fatigue as well. She does has nasal congestion, but no more than usual. She denies cough. No chest pain.   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    Past Surgical History:  Procedure Laterality Date   BREAST CYST ASPIRATION Left    COLONOSCOPY  2014   ECTOPIC PREGNANCY SURGERY  1990   IR FLUORO GUIDE CV LINE RIGHT  03/05/2019   IR FLUORO  GUIDE CV LINE RIGHT  03/15/2019   IR FLUORO GUIDE CV LINE RIGHT  05/17/2019   IR FLUORO GUIDE CV LINE RIGHT  05/23/2019   IR REMOVAL TUN CV CATH W/O FL  04/12/2019   IR REMOVAL TUN CV CATH W/O FL  08/06/2019   IR US GUIDE VASC ACCESS RIGHT  03/05/2019   IR US GUIDE VASC ACCESS RIGHT  03/15/2019   IR US GUIDE VASC ACCESS RIGHT  05/17/2019   TONSILLECTOMY  1980   UPPER GI ENDOSCOPY  08/2017   [facility]    Family History  Problem Relation Age of Onset   Hypertension Mother    Diabetes Mother    Heart attack Mother 89   Asthma Mother        Childhood   Arthritis Mother    Heart disease Mother    Hyperlipidemia Mother    Hypertension Sister    Diabetes Sister    Asthma Sister 56       asthma attack cause of death   Diabetes Brother    Hypertension Brother    Diabetes Brother    Hypertension Brother    Hypertension Maternal Grandmother    Heart attack Maternal Grandfather 76   Colon cancer Neg Hx     Social  History   Socioeconomic History   Marital status: Single    Spouse name: Not on file   Number of children: 1   Years of education: Not on file   Highest education level: Some college, no degree  Occupational History    Employer: [employer]  Tobacco Use   Smoking status: Never   Smokeless tobacco: Never  Vaping Use   Vaping Use: Never used  Substance and Sexual Activity   Alcohol use: Yes    Comment: 2 a month   Drug use: No   Sexual activity: Not Currently    Birth control/protection: Post-menopausal  Other Topics Concern   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      Family: Daughter - Hospital doctor - good relationship      Enjoys: watch TV and read      Exercise: dancing, on her own   Diet: does not follow diabetic diet, not eating as much      Safety   Seat belts: Yes    Guns: No   Safe in relationships: Yes        Social  Determinants of Radio broadcast assistant Strain: Not on file  Food Insecurity: Not on file  Transportation Needs: Not on file  Physical Activity: Not on file  Stress: Not on file  Social Connections: Not on file  Intimate Partner Violence: Not on file    Outpatient Medications Prior to Visit  Medication Sig Dispense Refill   acetaminophen (TYLENOL) 325 MG tablet Take 650 mg by mouth every 6 (six) hours as needed.     Alum Hydroxide-Mag Carbonate (GAVISCON EXTRA STRENGTH PO) Take 1 tablet by mouth 4 (four) times daily as needed.     Ascorbic Acid (VITAMIN C) 500 MG CAPS Take by mouth daily.     cholecalciferol (VITAMIN D3) 25 MCG (1000 UT) tablet Take 1,000 Units by mouth daily.     Dulaglutide (TRULICITY) 8.31 DV/7.6HY SOPN Inject 0.75 mg into the skin once a week. 6 mL 1   estradiol (ESTRACE) 0.1 MG/GM vaginal cream 1 gram vaginally twice weekly 42.5 g 6   fexofenadine (ALLEGRA) 180 MG tablet Take 180 mg by mouth daily as needed for allergies or rhinitis.     folic acid (FOLVITE) 1 MG tablet Take 2 tablets by mouth once daily 60 tablet 0   furosemide (LASIX) 40 MG tablet Take 1 tablet by mouth once daily 30 tablet 0   Glucose Blood (ONETOUCH ULTRA BLUE VI) Check blood sugar as directed; once or twice daily.     glucose blood test strip Use as instructed by checking blood sugar 1 - 2 times daily Dx E11.9 100 each 1   MAGNESIUM GLYCINATE PO Take 200 mg by mouth daily.     mometasone (ELOCON) 0.1 % cream APPLY  CREAM TOPICALLY ONCE DAILY 45 g 0   Multiple Vitamins-Minerals (MULTI FOR HER 50+) TABS Take 1 tablet by mouth daily.     potassium chloride (KLOR-CON) 10 MEQ tablet TAKE 1  BY MOUTH ONCE DAILY 30 tablet 0   trimethoprim-polymyxin b (POLYTRIM) ophthalmic solution Place 1 drop into both eyes 4 (four) times daily. For 5 to 7 days. 10 mL 0   vitamin B-12 (CYANOCOBALAMIN) 500 MCG tablet Take 500 mcg by mouth daily.     Cyanocobalamin (VITAMIN B-12)  5000 MCG TBDP Take 5,000 mcg by  mouth every other day.     senna-docusate (SENOKOT-S) 8.6-50 MG tablet Take 2 tablets by mouth 2 (two) times daily. 20 tablet 0   ZINC PICOLINATE PO Take 30 mg by mouth daily.     No facility-administered medications prior to visit.    Allergies  Allergen Reactions   Metformin Other (See Comments)   Other Other (See Comments)   Metformin And Related Itching    Heart palpitation   Sulfa Antibiotics Itching   Sulfasalazine Itching    ROS Review of Systems  Constitutional:  Positive for fatigue.  HENT:  Positive for congestion and postnasal drip. Negative for sinus pressure, sinus pain and sore throat.   Respiratory:  Negative for cough, shortness of breath and wheezing.   Cardiovascular:  Positive for leg swelling (mild, not taking lasix as directed). Negative for chest pain and palpitations.  Endocrine: Negative for polydipsia, polyphagia and polyuria.  Genitourinary:  Positive for frequency and urgency. Negative for dysuria and pelvic pain.     Objective:    Physical Exam Constitutional:      General: She is not in acute distress.    Appearance: Normal appearance. She is obese. She is not ill-appearing, toxic-appearing or diaphoretic.  HENT:     Head: Normocephalic.     Right Ear: Hearing and ear canal normal. A middle ear effusion is present. Right ear foreign body: clear effusion.     Left Ear: Hearing, tympanic membrane and ear canal normal.     Nose: Nose normal.  Cardiovascular:     Comments: Nonpitting edema  Musculoskeletal:     Right lower leg: 1+ Edema present.     Left lower leg: 1+ Edema present.  Neurological:     Mental Status: She is alert.    BP 134/76    Pulse 86    Temp (!) 97.2 F (36.2 C) (Temporal)    Ht 5\' 1"  (1.549 m)    Wt 185 lb (83.9 kg)    LMP 04/30/2012    SpO2 98%    BMI 34.96 kg/m  Wt Readings from Last 3 Encounters:  03/18/21 185 lb (83.9 kg)  01/19/21 182 lb (82.6 kg)  11/30/20 182 lb 6.4 oz (82.7 kg)     Health Maintenance Due   Topic Date Due   Pneumococcal Vaccine 16-2 Years old (1 - PCV) Never done   Zoster Vaccines- Shingrix (1 of 2) Never done   FOOT EXAM  08/14/2020   OPHTHALMOLOGY EXAM  01/13/2021    There are no preventive care reminders to display for this patient.  Lab Results  Component Value Date   TSH 0.72 08/29/2019   Lab Results  Component Value Date   WBC 4.0 11/30/2020   HGB 12.7 11/30/2020   HCT 37.2 11/30/2020   MCV 88.8 11/30/2020   PLT 125 (L) 11/30/2020   Lab Results  Component Value Date   NA 136 11/30/2020   K 3.9 11/30/2020   CO2 29 11/30/2020   GLUCOSE 226 (H) 11/30/2020   BUN 6 (L) 11/30/2020   CREATININE 0.84 11/30/2020   BILITOT 0.8 11/30/2020   ALKPHOS 122 11/30/2020   AST 39 11/30/2020   ALT 28 11/30/2020   PROT 7.3 11/30/2020   ALBUMIN 4.0 11/30/2020   CALCIUM 10.0 11/30/2020   ANIONGAP 6 11/30/2020   GFR 74.06 11/19/2020   Lab Results  Component Value Date   HGBA1C 7.2 (A) 10/06/2020      Assessment &  Plan:   Problem List Items Addressed This Visit       Cardiovascular and Mediastinum   TTP (thrombotic thrombocytopenic purpura) (HCC) (Chronic)   Relevant Medications   vitamin B-12 (CYANOCOBALAMIN) 500 MCG tablet   Other Relevant Orders   CBC w/Diff     Digestive   Liver cirrhosis secondary to NASH [facility]) (Chronic)    Patient continue follow-up with hepatologist as scheduled.  Stable      Relevant Orders   Comprehensive metabolic panel     Endocrine   Controlled type 2 diabetes mellitus without complication (HCC) (Chronic)    A1c in the office is acceptable we will continue with Trulicity 9.32 mg once weekly [place].  Patient to follow-up with [place] in 3 months and will reevaluate.  Work on diabetic diet and exercise as tolerated.      Relevant Orders   Ambulatory referral to Ophthalmology   POCT glycosylated hemoglobin (Hb A1C)     Other   Vitamin B12 deficiency    Patient with history of vitamin B12 deficiency function supplementing daily  with B12 5000 mcg.  Advised patient that her last B12 was very elevated so I have recommended the patient go down to 500 mcg once daily.  We will recheck B12 today pending results for further treatment      Shortness of breath    Patient states feeling more tired than usual with increasing shortness of breath however mild.  She states it is mainly on exertion.  We will check CBC today to ensure that anemia is stable.  Advised if any increasing shortness of the patient to go to the ER [facility].      B12 deficiency   Relevant Orders   B12 and Folate Panel   Urinary frequency    Your complaints of urinary frequency urinalysis and culture ordered for today pending results, will pend results to decide to treat or not.      Relevant Orders   Urine Culture   Urinalysis, Routine w reflex microscopic   Vitamin D deficiency    Patient with history of vitamin D deficiency currently supplementing daily.  Will order vitamin D today pending results.      Relevant Orders   VITAMIN D 25 Hydroxy (Vit-D Deficiency, Fractures)   Hypomagnesuria - Primary    Patient currently supplementing with magnesium daily and has a history of hypomagnesemia which has been resolved for a few years.  We will check magnesium today per patient request as well.      Relevant Orders   Magnesium    No orders of the defined types were placed in this encounter.   Follow-up: No follow-ups on file.    Eugenia Pancoast, FNP

## 2021-03-18 NOTE — Patient Instructions (Addendum)
Stop by the lab prior to leaving today. I will notify you of your results once received.   Stop by the pharmacy and get otc b12 , I would suggest a lower dose of maybe 500 mcg once daily for you to take instead of 5000.

## 2021-03-19 ENCOUNTER — Encounter: Payer: Self-pay | Admitting: Hematology & Oncology

## 2021-03-19 LAB — COMPREHENSIVE METABOLIC PANEL
ALT: 18 U/L (ref 0–35)
AST: 27 U/L (ref 0–37)
Albumin: 4.3 g/dL (ref 3.5–5.2)
Alkaline Phosphatase: 131 U/L — ABNORMAL HIGH (ref 39–117)
BUN: 7 mg/dL (ref 6–23)
CO2: 30 mEq/L (ref 19–32)
Calcium: 10.1 mg/dL (ref 8.4–10.5)
Chloride: 103 mEq/L (ref 96–112)
Creatinine, Ser: 0.88 mg/dL (ref 0.40–1.20)
GFR: 70.88 mL/min (ref >60.00)
Glucose, Bld: 103 mg/dL — ABNORMAL HIGH (ref 70–99)
Potassium: 3.9 mEq/L (ref 3.5–5.1)
Sodium: 139 mEq/L (ref 135–145)
Total Bilirubin: 0.7 mg/dL (ref 0.2–1.2)
Total Protein: 7.6 g/dL (ref 6.0–8.3)

## 2021-03-19 LAB — CBC WITH DIFFERENTIAL/PLATELET
Basophils Absolute: 0 10*3/uL (ref 0.0–0.1)
Basophils Relative: 1 % (ref 0.0–3.0)
Eosinophils Absolute: 0.2 10*3/uL (ref 0.0–0.7)
Eosinophils Relative: 3.9 % (ref 0.0–5.0)
HCT: 40.9 % (ref 36.0–46.0)
Hemoglobin: 13.5 g/dL (ref 12.0–15.0)
Lymphocytes Relative: 36 % (ref 12.0–46.0)
Lymphs Abs: 1.6 10*3/uL (ref 0.7–4.0)
MCHC: 32.9 g/dL (ref 30.0–36.0)
MCV: 90 fl (ref 78.0–100.0)
Monocytes Absolute: 0.3 10*3/uL (ref 0.1–1.0)
Monocytes Relative: 7.3 % (ref 3.0–12.0)
Neutro Abs: 2.3 10*3/uL (ref 1.4–7.7)
Neutrophils Relative %: 51.8 % (ref 43.0–77.0)
Platelets: 139 10*3/uL — ABNORMAL LOW (ref 150.0–400.0)
RBC: 4.54 Mil/uL (ref 3.87–5.11)
RDW: 13.1 % (ref 11.5–15.5)
WBC: 4.4 10*3/uL (ref 4.0–10.5)

## 2021-03-19 LAB — POCT GLYCOSYLATED HEMOGLOBIN (HGB A1C): Hemoglobin A1C: 6.8 % — AB (ref 4.0–5.6)

## 2021-03-19 LAB — URINE CULTURE
MICRO NUMBER:: 12863123
SPECIMEN QUALITY:: ADEQUATE

## 2021-03-19 LAB — URINALYSIS, ROUTINE W REFLEX MICROSCOPIC
Bilirubin Urine: NEGATIVE
Hgb urine dipstick: NEGATIVE
Ketones, ur: NEGATIVE
Leukocytes,Ua: NEGATIVE
Nitrite: NEGATIVE
RBC / HPF: NONE SEEN (ref 0–?)
Specific Gravity, Urine: <=1.005 — AB (ref 1.000–1.030)
Total Protein, Urine: NEGATIVE
Urine Glucose: NEGATIVE
Urobilinogen, UA: 0.2 (ref 0.0–1.0)
WBC, UA: NONE SEEN (ref 0–?)
pH: 7 (ref 5.0–8.0)

## 2021-03-19 LAB — B12 AND FOLATE PANEL
Folate: >24.2 ng/mL (ref >5.9)
Vitamin B-12: >1550 pg/mL — ABNORMAL HIGH (ref 211–911)

## 2021-03-19 LAB — VITAMIN D 25 HYDROXY (VIT D DEFICIENCY, FRACTURES): VITD: 42.6 ng/mL (ref 30.00–100.00)

## 2021-03-19 LAB — MAGNESIUM: Magnesium: 2.1 mg/dL (ref 1.5–2.5)

## 2021-03-19 NOTE — Assessment & Plan Note (Signed)
Patient currently supplementing with magnesium daily and has a history of hypomagnesemia which has been resolved for a few years.  We will check magnesium today per patient request as well.

## 2021-03-19 NOTE — Assessment & Plan Note (Signed)
Patient continue follow-up with hepatologist as scheduled.  Stable

## 2021-03-19 NOTE — Assessment & Plan Note (Signed)
A1c in the office is acceptable we will continue with Trulicity 3.22 mg once weekly [place].  Patient to follow-up with [place] in 3 months and will reevaluate.  Work on diabetic diet and exercise as tolerated.

## 2021-03-19 NOTE — Assessment & Plan Note (Addendum)
Patient states feeling more tired than usual with increasing shortness of breath however mild.  She states it is mainly on exertion.  We will check CBC today to ensure that anemia is stable.  Advised if any increasing shortness of the patient to go to the ER [facility].

## 2021-03-19 NOTE — Assessment & Plan Note (Signed)
Patient with history of vitamin D deficiency currently supplementing daily.  Will order vitamin D today pending results.

## 2021-03-19 NOTE — Assessment & Plan Note (Signed)
Your complaints of urinary frequency urinalysis and culture ordered for today pending results, will pend results to decide to treat or not.

## 2021-03-19 NOTE — Assessment & Plan Note (Signed)
Patient with history of vitamin B12 deficiency function supplementing daily with B12 5000 mcg.  Advised patient that her last B12 was very elevated so I have recommended the patient go down to 500 mcg once daily.  We will recheck B12 today pending results for further treatment

## 2021-03-20 ENCOUNTER — Encounter: Payer: Self-pay | Admitting: Family

## 2021-03-23 ENCOUNTER — Ambulatory Visit: Payer: Federal, State, Local not specified - PPO | Admitting: Nurse Practitioner

## 2021-03-23 ENCOUNTER — Inpatient Hospital Stay: Payer: Federal, State, Local not specified - PPO

## 2021-03-31 ENCOUNTER — Other Ambulatory Visit: Payer: Self-pay | Admitting: Family Medicine

## 2021-03-31 DIAGNOSIS — E119 Type 2 diabetes mellitus without complications: Secondary | ICD-10-CM

## 2021-04-01 ENCOUNTER — Ambulatory Visit: Payer: Federal, State, Local not specified - PPO | Admitting: Hematology & Oncology

## 2021-04-01 ENCOUNTER — Other Ambulatory Visit: Payer: Federal, State, Local not specified - PPO

## 2021-04-01 ENCOUNTER — Ambulatory Visit: Payer: Federal, State, Local not specified - PPO | Admitting: Obstetrics & Gynecology

## 2021-04-01 DIAGNOSIS — K7581 Nonalcoholic steatohepatitis (NASH): Secondary | ICD-10-CM | POA: Diagnosis not present

## 2021-04-01 DIAGNOSIS — K7460 Unspecified cirrhosis of liver: Secondary | ICD-10-CM | POA: Diagnosis not present

## 2021-04-05 IMAGING — DX DG CHEST 1V PORT
1 series · 1 of 1 positions shown · non-contrast
Comparison: 03/18/2019

CLINICAL DATA: Fever, chemo infusion yesterday

EXAM:
PORTABLE CHEST 1 VIEW

[chest ap]
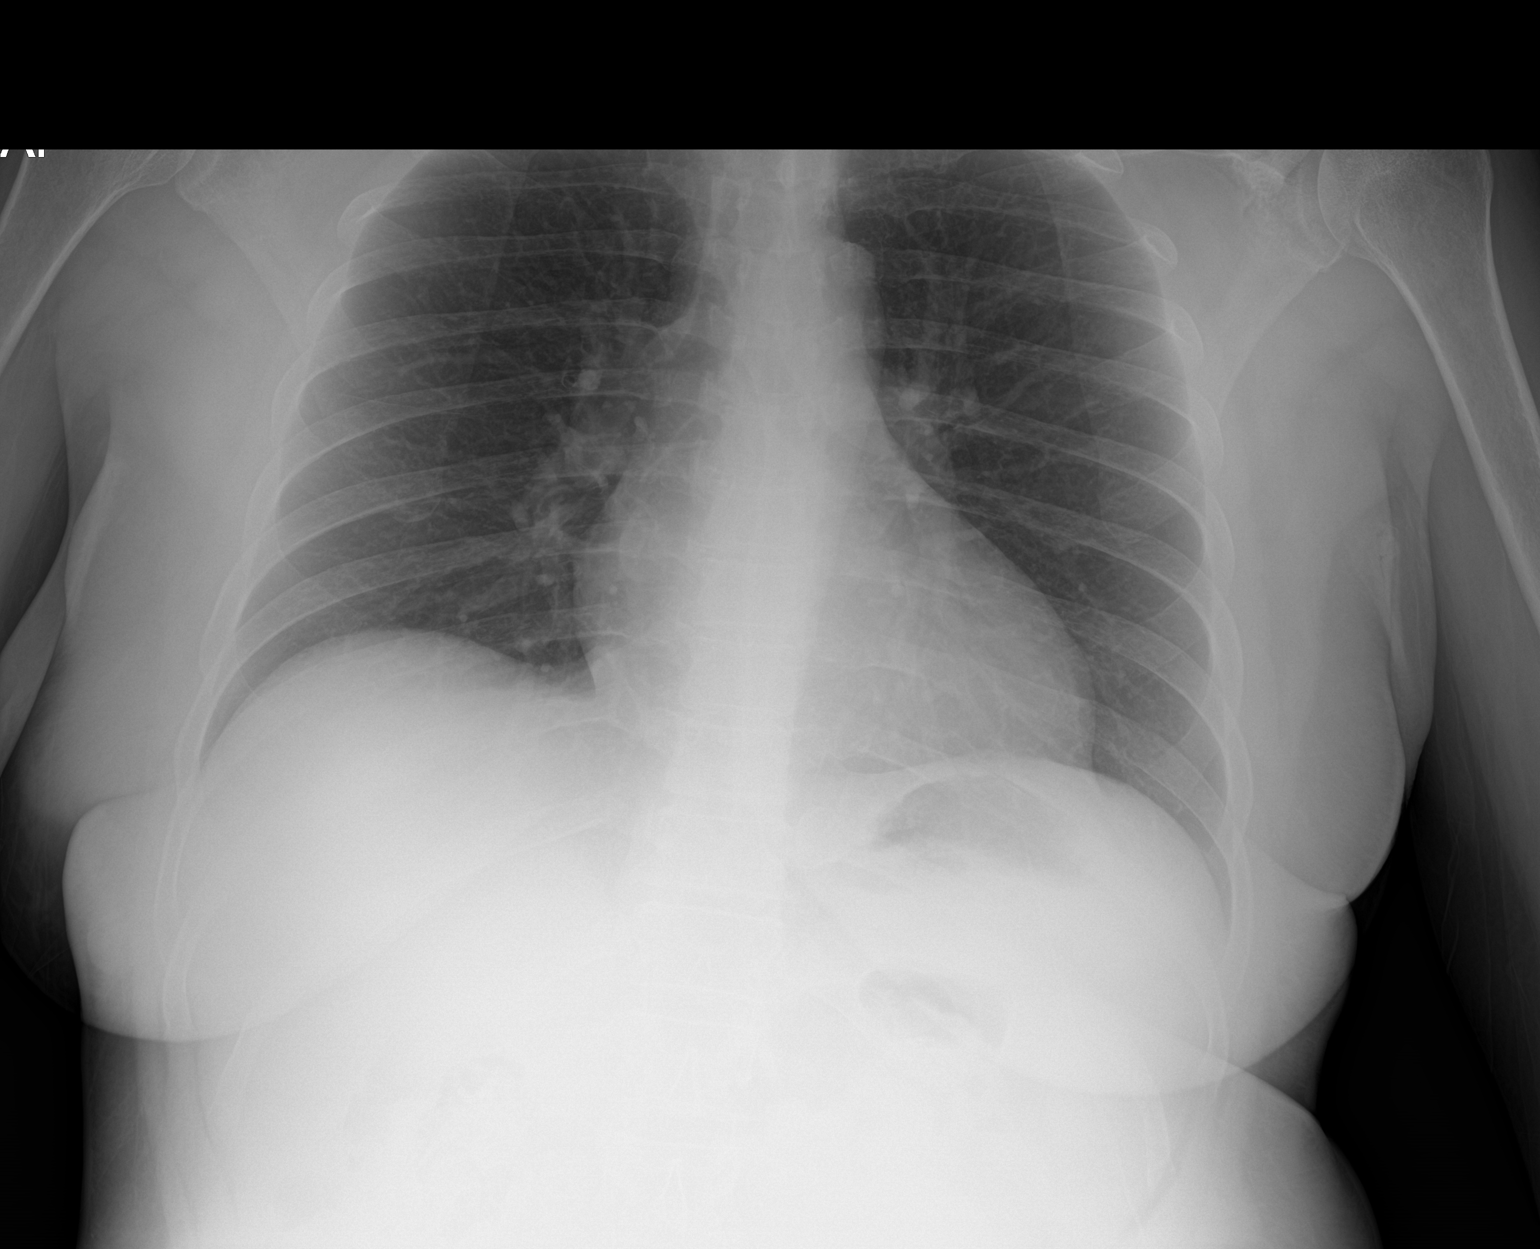

[1 of 1 positions shown; findings below may reference images not displayed]

FINDINGS: Cardiomediastinal contours and hilar structures are normal. Lungs
are clear. No signs of pleural effusion.

Visualized skeletal structures are unremarkable.
IMPRESSION: No active disease.

## 2021-04-13 ENCOUNTER — Ambulatory Visit: Payer: Federal, State, Local not specified - PPO | Admitting: Family Medicine

## 2021-04-26 ENCOUNTER — Encounter: Payer: Self-pay | Admitting: Hematology & Oncology

## 2021-04-26 ENCOUNTER — Inpatient Hospital Stay: Payer: Federal, State, Local not specified - PPO | Attending: Hematology & Oncology

## 2021-04-26 ENCOUNTER — Inpatient Hospital Stay: Payer: Federal, State, Local not specified - PPO | Admitting: Hematology & Oncology

## 2021-04-26 ENCOUNTER — Other Ambulatory Visit: Payer: Self-pay

## 2021-04-26 VITALS — BP 128/66 | HR 83 | Temp 98.8°F | Resp 18 | Wt 185.0 lb

## 2021-04-26 DIAGNOSIS — K7581 Nonalcoholic steatohepatitis (NASH): Secondary | ICD-10-CM | POA: Insufficient documentation

## 2021-04-26 DIAGNOSIS — E119 Type 2 diabetes mellitus without complications: Secondary | ICD-10-CM | POA: Insufficient documentation

## 2021-04-26 DIAGNOSIS — D72819 Decreased white blood cell count, unspecified: Secondary | ICD-10-CM | POA: Insufficient documentation

## 2021-04-26 DIAGNOSIS — M3119 Other thrombotic microangiopathy: Secondary | ICD-10-CM | POA: Insufficient documentation

## 2021-04-26 LAB — CBC WITH DIFFERENTIAL (CANCER CENTER ONLY)
Abs Immature Granulocytes: 0.01 10*3/uL (ref 0.00–0.07)
Basophils Absolute: 0 10*3/uL (ref 0.0–0.1)
Basophils Relative: 1 %
Eosinophils Absolute: 0.2 10*3/uL (ref 0.0–0.5)
Eosinophils Relative: 5 %
HCT: 38 % (ref 36.0–46.0)
Hemoglobin: 13 g/dL (ref 12.0–15.0)
Immature Granulocytes: 0 %
Lymphocytes Relative: 33 %
Lymphs Abs: 1.2 10*3/uL (ref 0.7–4.0)
MCH: 30.7 pg (ref 26.0–34.0)
MCHC: 34.2 g/dL (ref 30.0–36.0)
MCV: 89.6 fL (ref 80.0–100.0)
Monocytes Absolute: 0.2 10*3/uL (ref 0.1–1.0)
Monocytes Relative: 6 %
Neutro Abs: 2.1 10*3/uL (ref 1.7–7.7)
Neutrophils Relative %: 55 %
Platelet Count: 119 10*3/uL — ABNORMAL LOW (ref 150–400)
RBC: 4.24 MIL/uL (ref 3.87–5.11)
RDW: 12.8 % (ref 11.5–15.5)
WBC Count: 3.8 10*3/uL — ABNORMAL LOW (ref 4.0–10.5)
nRBC: 0 % (ref 0.0–0.2)

## 2021-04-26 LAB — RETICULOCYTES
Immature Retic Fract: 12.1 % (ref 2.3–15.9)
RBC.: 4.3 MIL/uL (ref 3.87–5.11)
Retic Count, Absolute: 65.8 10*3/uL (ref 19.0–186.0)
Retic Ct Pct: 1.5 % (ref 0.4–3.1)

## 2021-04-26 LAB — CMP (CANCER CENTER ONLY)
ALT: 18 U/L (ref 0–44)
AST: 26 U/L (ref 15–41)
Albumin: 3.9 g/dL (ref 3.5–5.0)
Alkaline Phosphatase: 135 U/L — ABNORMAL HIGH (ref 38–126)
Anion gap: 6 (ref 5–15)
BUN: 8 mg/dL (ref 8–23)
CO2: 28 mmol/L (ref 22–32)
Calcium: 9.7 mg/dL (ref 8.9–10.3)
Chloride: 102 mmol/L (ref 98–111)
Creatinine: 0.83 mg/dL (ref 0.44–1.00)
GFR, Estimated: >60 mL/min (ref >60)
Glucose, Bld: 227 mg/dL — ABNORMAL HIGH (ref 70–99)
Potassium: 3.8 mmol/L (ref 3.5–5.1)
Sodium: 136 mmol/L (ref 135–145)
Total Bilirubin: 0.9 mg/dL (ref 0.3–1.2)
Total Protein: 7.2 g/dL (ref 6.5–8.1)

## 2021-04-26 LAB — LACTATE DEHYDROGENASE: LDH: 132 U/L (ref 98–192)

## 2021-04-26 LAB — SAVE SMEAR(SSMR), FOR PROVIDER SLIDE REVIEW

## 2021-04-26 MED ORDER — FOLIC ACID 1 MG PO TABS: 1.0000 mg | ORAL_TABLET | Freq: Every day | ORAL | 0 refills | Status: DC

## 2021-04-26 NOTE — Progress Notes (Signed)
Hematology and Oncology Follow Up Visit  Vicki [name] [id number] 11/18/1960 61 y.o. 04/26/2021   Principle Diagnosis:  TTP - acquired -- relapsed NASH -- leukopenia/thrombocytopenia  Current Therapy:   Plasma Exchange - M-Th -- d/c on 08/01/2019 Rituxan 375 mg/m2 IV weekly -- s/p cycle #4 - completed on 06/04/2019     Interim History:  Vicki [name] is back for her follow-up.  We last saw her back in September.  Since then, she has been doing okay.  She is gained a little bit of weight.  Her blood sugars still are on the high side.  She has had no problems with bleeding.  She has had issues with the diabetes.  Today, her blood sugar is 227.  Her  ADAMTS-13 is still quite good.  Back in September it was over 100%.  Of note, I told her to decrease the folic acid to 1 mg a day.  She has had no problems with fever.  There is no headache.  She has had some tingling in the toes, which I suspect is probably from the diabetes.  She has had no problems with cough or shortness of breath.  There is been no issues with COVID.  She saw the hepatologist in October last year.  The hepatologist seem to be satisfied with how well she is doing.  She does not need to be on any priority list for a liver transplant.  Her MELD score was only 7 which is certainly encouraging.  Currently, I would say performance status is probably ECOG 1.    Medications:  Current Outpatient Medications:    Zinc 25 MG TABS, Take by mouth., Disp: , Rfl:    acetaminophen (TYLENOL) 325 MG tablet, Take 650 mg by mouth every 6 (six) hours as needed., Disp: , Rfl:    Alum Hydroxide-Mag Carbonate (GAVISCON EXTRA STRENGTH PO), Take 1 tablet by mouth 4 (four) times daily as needed., Disp: , Rfl:    Ascorbic Acid (VITAMIN C) 500 MG CAPS, Take by mouth daily., Disp: , Rfl:    cholecalciferol (VITAMIN D3) 25 MCG (1000 UT) tablet, Take 1,000 Units by mouth daily., Disp: , Rfl:    estradiol (ESTRACE) 0.1 MG/GM vaginal cream, 1  gram vaginally twice weekly, Disp: 42.5 g, Rfl: 6   fexofenadine (ALLEGRA) 180 MG tablet, Take 180 mg by mouth daily as needed for allergies or rhinitis., Disp: , Rfl:    folic acid (FOLVITE) 1 MG tablet, Take 2 tablets by mouth once daily, Disp: 60 tablet, Rfl: 0   furosemide (LASIX) 40 MG tablet, Take 1 tablet by mouth once daily, Disp: 30 tablet, Rfl: 0   Glucose Blood (ONETOUCH ULTRA BLUE VI), Check blood sugar as directed; once or twice daily., Disp: , Rfl:    glucose blood test strip, Use as instructed by checking blood sugar 1 - 2 times daily Dx E11.9, Disp: 100 each, Rfl: 1   MAGNESIUM GLYCINATE PO, Take 200 mg by mouth daily., Disp: , Rfl:    MILK THISTLE PO, Take 400 mg by mouth daily., Disp: , Rfl:    mometasone (ELOCON) 0.1 % cream, APPLY  CREAM TOPICALLY ONCE DAILY, Disp: 45 g, Rfl: 0   potassium chloride (KLOR-CON) 10 MEQ tablet, TAKE 1  BY MOUTH ONCE DAILY, Disp: 30 tablet, Rfl: 0   TRULICITY 2.54 YH/0.6CB SOPN, INJECT 0.75 MG INTO THE SKIN ONCE A WEEK, Disp: 12 mL, Rfl: 0   vitamin B-12 (CYANOCOBALAMIN) 500 MCG tablet, Take 500 mcg by mouth  daily., Disp: , Rfl:   Allergies:  Allergies  Allergen Reactions   Metformin Other (See Comments)   Other Other (See Comments)   Metformin And Related Itching    Heart palpitation   Sulfa Antibiotics Itching   Sulfasalazine Itching    Past Medical History, Surgical history, Social history, and Family History were reviewed and updated.  Review of Systems: Review of Systems  Constitutional: Negative.   HENT:  Negative.    Eyes: Negative.   Respiratory: Negative.    Cardiovascular:  Positive for leg swelling.  Gastrointestinal: Negative.   Endocrine: Negative.   Genitourinary: Negative.    Musculoskeletal:  Positive for arthralgias and myalgias.  Skin: Negative.   Neurological: Negative.   Hematological: Negative.   Psychiatric/Behavioral: Negative.     Physical Exam:  weight is 185 lb (83.9 kg). Her oral temperature is 98.8  F (37.1 C). Her blood pressure is 128/66 and her pulse is 83. Her respiration is 18 and oxygen saturation is 98%.   Wt Readings from Last 3 Encounters:  04/26/21 185 lb (83.9 kg)  03/18/21 185 lb (83.9 kg)  01/19/21 182 lb (82.6 kg)    Physical Exam Vitals reviewed.  HENT:     Head: Normocephalic and atraumatic.  Eyes:     Pupils: Pupils are equal, round, and reactive to light.  Cardiovascular:     Rate and Rhythm: Normal rate and regular rhythm.     Heart sounds: Normal heart sounds.  Pulmonary:     Effort: Pulmonary effort is normal.     Breath sounds: Normal breath sounds.  Abdominal:     General: Bowel sounds are normal.     Palpations: Abdomen is soft.  Musculoskeletal:        General: No tenderness or deformity. Normal range of motion.     Cervical back: Normal range of motion.     Comments: Extremities shows some 1+ edema in her lower legs.  She has good range of motion of her joints.  She has good strength in upper and lower extremities.  Lymphadenopathy:     Cervical: No cervical adenopathy.  Skin:    General: Skin is warm and dry.     Findings: No erythema or rash.  Neurological:     Mental Status: She is alert and oriented to person, place, and time.  Psychiatric:        Behavior: Behavior normal.        Thought Content: Thought content normal.        Judgment: Judgment normal.     Lab Results  Component Value Date   WBC 3.8 (L) 04/26/2021   HGB 13.0 04/26/2021   HCT 38.0 04/26/2021   MCV 89.6 04/26/2021   PLT 119 (L) 04/26/2021     Chemistry      Component Value Date/Time   NA 136 04/26/2021 1457   K 3.8 04/26/2021 1457   CL 102 04/26/2021 1457   CO2 28 04/26/2021 1457   BUN 8 04/26/2021 1457   CREATININE 0.83 04/26/2021 1457   CREATININE 0.70 09/25/2015 1504      Component Value Date/Time   CALCIUM 9.7 04/26/2021 1457   ALKPHOS 135 (H) 04/26/2021 1457   AST 26 04/26/2021 1457   ALT 18 04/26/2021 1457   BILITOT 0.9 04/26/2021 1457       Impression and Plan: Vicki [name] is a 61 year old African-American female.  She has acquired TTP.  She had relapsed.  She responded well to treatment with plasma exchange  and Rituxan.  She is now has been off treatment now for close to 2 years.  I have to believe that she is still in remission.  Clearly, her biggest problem is going be her diabetes.  I think this will continue to be an issue if she does not get it under better control.  I looked at her blood smear under the microscope.  I do not see any schistocytes.  I do not see any nucleated red blood cells.  She has adequate platelets and white blood cells.  I think we can plan to have her follow-up in 6 more months.  I think this is a appropriate time for follow-up.  Again, her prognosis clearly is tied to her diabetes.   Volanda Napoleon, MD 2/20/20233:54 PM

## 2021-04-27 ENCOUNTER — Other Ambulatory Visit: Payer: Self-pay | Admitting: Hematology & Oncology

## 2021-04-27 DIAGNOSIS — M3119 Other thrombotic microangiopathy: Secondary | ICD-10-CM

## 2021-04-28 LAB — ADAMTS13 ACTIVITY REFLEX

## 2021-04-28 LAB — ADAMTS13 ACTIVITY: Adamts 13 Activity: >100 % (ref >66.8)

## 2021-04-29 ENCOUNTER — Other Ambulatory Visit: Payer: Self-pay | Admitting: *Deleted

## 2021-04-29 DIAGNOSIS — M3119 Other thrombotic microangiopathy: Secondary | ICD-10-CM

## 2021-04-29 MED ORDER — FOLIC ACID 1 MG PO TABS: ORAL_TABLET | ORAL | 3 refills | Status: DC

## 2021-05-06 ENCOUNTER — Other Ambulatory Visit: Payer: Self-pay | Admitting: *Deleted

## 2021-05-06 MED ORDER — POTASSIUM CHLORIDE ER 10 MEQ PO TBCR: EXTENDED_RELEASE_TABLET | ORAL | 3 refills | Status: DC

## 2021-05-20 ENCOUNTER — Ambulatory Visit: Payer: Federal, State, Local not specified - PPO | Admitting: Family Medicine

## 2021-05-28 ENCOUNTER — Ambulatory Visit: Payer: Federal, State, Local not specified - PPO | Admitting: Obstetrics & Gynecology

## 2021-05-31 ENCOUNTER — Other Ambulatory Visit: Payer: Self-pay | Admitting: Hematology & Oncology

## 2021-06-03 ENCOUNTER — Other Ambulatory Visit: Payer: Self-pay

## 2021-06-03 MED ORDER — GLUCOSE BLOOD VI STRP: ORAL_STRIP | 1 refills | Status: DC

## 2021-06-04 ENCOUNTER — Other Ambulatory Visit: Payer: Self-pay | Admitting: Hematology & Oncology

## 2021-06-08 ENCOUNTER — Ambulatory Visit (INDEPENDENT_AMBULATORY_CARE_PROVIDER_SITE_OTHER): Payer: Federal, State, Local not specified - PPO | Admitting: Family Medicine

## 2021-06-09 NOTE — Progress Notes (Signed)
error 

## 2021-06-18 ENCOUNTER — Encounter: Payer: Self-pay | Admitting: Family Medicine

## 2021-06-18 ENCOUNTER — Ambulatory Visit: Payer: Federal, State, Local not specified - PPO | Admitting: Family Medicine

## 2021-06-18 VITALS — BP 130/70 | HR 75 | Temp 98.3°F | Ht 61.0 in | Wt 187.2 lb

## 2021-06-18 DIAGNOSIS — Z0000 Encounter for general adult medical examination without abnormal findings: Secondary | ICD-10-CM | POA: Diagnosis not present

## 2021-06-18 DIAGNOSIS — K7581 Nonalcoholic steatohepatitis (NASH): Secondary | ICD-10-CM | POA: Diagnosis not present

## 2021-06-18 DIAGNOSIS — K7460 Unspecified cirrhosis of liver: Secondary | ICD-10-CM

## 2021-06-18 DIAGNOSIS — E119 Type 2 diabetes mellitus without complications: Secondary | ICD-10-CM | POA: Diagnosis not present

## 2021-06-18 DIAGNOSIS — D649 Anemia, unspecified: Secondary | ICD-10-CM

## 2021-06-18 DIAGNOSIS — R350 Frequency of micturition: Secondary | ICD-10-CM

## 2021-06-18 DIAGNOSIS — M48061 Spinal stenosis, lumbar region without neurogenic claudication: Secondary | ICD-10-CM

## 2021-06-18 LAB — CBC
HCT: 39.5 % (ref 36.0–46.0)
Hemoglobin: 13.3 g/dL (ref 12.0–15.0)
MCHC: 33.7 g/dL (ref 30.0–36.0)
MCV: 90.6 fl (ref 78.0–100.0)
Platelets: 123 10*3/uL — ABNORMAL LOW (ref 150.0–400.0)
RBC: 4.36 Mil/uL (ref 3.87–5.11)
RDW: 13.4 % (ref 11.5–15.5)
WBC: 3.7 10*3/uL — ABNORMAL LOW (ref 4.0–10.5)

## 2021-06-18 LAB — PROTIME-INR
INR: 1.2 ratio — ABNORMAL HIGH (ref 0.8–1.0)
Prothrombin Time: 13.2 s — ABNORMAL HIGH (ref 9.6–13.1)

## 2021-06-18 LAB — LIPID PANEL
Cholesterol: 207 mg/dL — ABNORMAL HIGH (ref 0–200)
HDL: 80.9 mg/dL (ref >39.00)
LDL Cholesterol: 115 mg/dL — ABNORMAL HIGH (ref 0–99)
NonHDL: 125.83
Total CHOL/HDL Ratio: 3
Triglycerides: 56 mg/dL (ref 0.0–149.0)
VLDL: 11.2 mg/dL (ref 0.0–40.0)

## 2021-06-18 LAB — POCT GLYCOSYLATED HEMOGLOBIN (HGB A1C): Hemoglobin A1C: 7 % — AB (ref 4.0–5.6)

## 2021-06-18 MED ORDER — TRULICITY 0.75 MG/0.5ML ~~LOC~~ SOAJ: 0.7500 mg | SUBCUTANEOUS | 0 refills | Status: DC

## 2021-06-18 NOTE — Progress Notes (Signed)
Annual Exam  ? ?Chief Complaint:  ?Chief Complaint  ?Patient presents with  ? Follow-up  ?  3 mo DM   ? ? ?History of Present Illness:  ?[name] is a 61 y.o. G1P0011 who LMP was Patient's last menstrual period was 04/30/2012., presents today for her annual examination.   ? ? ?Nutrition ?She does get adequate calcium and Vitamin D in her diet. ?Diet: diabetic  ?Exercise: walking ? ? ? ?Social History  ? ?Tobacco Use  ?Smoking Status Never  ?Smokeless Tobacco Never  ? ?Social History  ? ?Substance and Sexual Activity  ?Alcohol Use Yes  ? Comment: 2 a month  ? ?Social History  ? ?Substance and Sexual Activity  ?Drug Use No  ? ? ? ?General Health ?Dentist in the last year: No ?Eye doctor: no - in June ? ?Safety ?The patient wears seatbelts: yes.     ?The patient feels safe at home and in their relationships: yes. ? ? ?Menstrual:  ?Symptoms of menopause: vaginal dryness, on estradiol ? ? ?GYN ?She is single partner, contraception - post menopausal status.  ? ? ?Cervical Cancer Screening (21-65):   ?Last Pap:   July 2021 Results were: no abnormalities /neg HPV DNA  ? ?Breast Cancer Screening (Age 33-74):  ?There is no FH of breast cancer. There is no FH of ovarian cancer. BRCA screening Not Indicated.  ?Last Mammogram: 11/2020 ?The patient does want a mammogram this year.  ? ? ?Colon Cancer Screening:  ?Age 90-75 yo - benefits outweigh the risk. Adults 90-85 yo who have never been screened benefit.  ?Benefits: 134000 people in 2016 will be diagnosed and 49,000 will die - early detection helps ?Harms: Complications 2/2 to colonoscopy ?High Risk (Colonoscopy): genetic disorder (Lynch syndrome or familial adenomatous polyposis), personal hx of IBD, previous adenomatous polyp, or previous colorectal cancer, FamHx start 10 years before the age at diagnosis, increased in males and black race ? ?Options:  ?FIT - looks for hemoglobin (blood in the stool) - specific and fairly sensitive - must be done  annually ?Cologuard - looks for DNA and blood - more sensitive - therefore can have more false positives, every 3 years ?Colonoscopy - every 10 years if normal - sedation, bowl prep, must have someone drive you ? ?Shared decision making and the patient had decided to do colonoscopy 2024. ? ? ?Social History  ? ?Tobacco Use  ?Smoking Status Never  ?Smokeless Tobacco Never  ? ? ?Lung Cancer Screening (Ages 70-96): not applicable ? ? ?Weight ?Wt Readings from Last 3 Encounters:  ?06/18/21 187 lb 4 oz (84.9 kg)  ?04/26/21 185 lb (83.9 kg)  ?03/18/21 185 lb (83.9 kg)  ? ?Patient has high BMI  ?BMI Readings from Last 1 Encounters:  ?06/18/21 35.38 kg/m?  ? ? ? ?Chronic disease screening ?Blood pressure monitoring:  ?BP Readings from Last 3 Encounters:  ?06/18/21 130/70  ?04/26/21 128/66  ?03/18/21 134/76  ? ? ?Lipid Monitoring: Indication for screening: age >29, obesity, diabetes, family hx, CV risk factors.  ?Lipid screening: Yes ? ?Lab Results  ?Component Value Date  ? CHOL 166 06/17/2020  ? HDL 67.00 06/17/2020  ? Placer 84 06/17/2020  ? TRIG 75.0 06/17/2020  ? CHOLHDL 2 06/17/2020  ? ? ? ?Diabetes Screening: age >81, overweight, family hx, PCOS, hx of gestational diabetes, at risk ethnicity ?Diabetes Screening screening: Yes ? ?Lab Results  ?Component Value Date  ? HGBA1C 7.0 (A) 06/18/2021  ? ? ? ?Past Medical History:  ?  Diagnosis Date  ? Allergy   ? Arthritis   ? Boil   ? Cancer [facility])   ? throbotic thrombocytopenic purpura  ? 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 Diabetes ([place])   ? type 2  ? Elevated liver enzymes   ? Fatty liver   ? Hidradenitis   ? Hyperlipemia   ? Hypertension   ? Palpitations   ? frequent PVCs on event monitor  ? PVC's (premature ventricular contractions)   ? 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 ? ?Past Surgical History:  ?Procedure Laterality Date  ? BREAST CYST ASPIRATION Left   ? COLONOSCOPY  2014  ? ECTOPIC PREGNANCY SURGERY  1990  ? IR FLUORO  GUIDE CV LINE RIGHT  03/05/2019  ? IR FLUORO GUIDE CV LINE RIGHT  03/15/2019  ? IR FLUORO GUIDE CV LINE RIGHT  05/17/2019  ? IR FLUORO GUIDE CV LINE RIGHT  05/23/2019  ? IR REMOVAL TUN CV CATH W/O FL  04/12/2019  ? IR REMOVAL TUN CV CATH W/O FL  08/06/2019  ? IR US GUIDE VASC ACCESS RIGHT  03/05/2019  ? IR US GUIDE VASC ACCESS RIGHT  03/15/2019  ? IR US GUIDE VASC ACCESS RIGHT  05/17/2019  ? TONSILLECTOMY  1980  ? UPPER GI ENDOSCOPY  08/2017  ? [facility]  ? ? ?Prior to Admission medications   ?Medication Sig Start Date End Date Taking? Authorizing Provider  ?acetaminophen (TYLENOL) 325 MG tablet Take 650 mg by mouth every 6 (six) hours as needed.   Yes [provider]  ?Alum Hydroxide-Mag Carbonate (GAVISCON EXTRA STRENGTH PO) Take 1 tablet by mouth 4 (four) times daily as needed. 03/26/19  Yes [provider]  ?Ascorbic Acid (VITAMIN C) 500 MG CAPS Take by mouth daily.   Yes [provider]  ?cholecalciferol (VITAMIN D3) 25 MCG (1000 UT) tablet Take 1,000 Units by mouth daily.   Yes [provider]  ?estradiol (ESTRACE) 0.1 MG/GM vaginal cream 1 gram vaginally twice weekly 05/21/20  Yes Karma Ganja, NP  ?fexofenadine (ALLEGRA) 180 MG tablet Take 180 mg by mouth daily as needed for allergies or rhinitis.   Yes [provider]  ?folic acid (FOLVITE) 1 MG tablet Take two tablets by mouth once daily 04/29/21  Yes Ennever, Rudell Cobb, MD  ?furosemide (LASIX) 40 MG tablet Take 1 tablet by mouth once daily 06/04/21  Yes Ennever, Rudell Cobb, MD  ?glucose blood test strip Use as instructed by checking blood sugar 1 - 2 times daily Dx E11.9 06/03/21  Yes Lesleigh Noe, MD  ?MAGNESIUM GLYCINATE PO Take 200 mg by mouth daily.   Yes [provider]  ?mometasone (ELOCON) 0.1 % cream APPLY  CREAM TOPICALLY ONCE DAILY 05/31/21  Yes Ennever, Rudell Cobb, MD  ?potassium chloride (KLOR-CON) 10 MEQ tablet TAKE 1  BY MOUTH ONCE DAILY 05/06/21  Yes Ennever, Rudell Cobb, MD  ?TRULICITY 1.61 WR/6.0AV  SOPN INJECT 0.75 MG INTO THE SKIN ONCE A WEEK 04/01/21  Yes Lesleigh Noe, MD  ?vitamin B-12 (CYANOCOBALAMIN) 500 MCG tablet Take 500 mcg by mouth daily.   Yes [provider]  ? ? ?Allergies  ?Allergen Reactions  ? Metformin Other (See Comments)  ? Other Other (See Comments)  ? Metformin And Related Itching  ?  Heart palpitation  ? Sulfa Antibiotics Itching  ? Sulfasalazine Itching  ? ? ?Gynecologic History: Patient's last menstrual period was 04/30/2012. ? ?Obstetric History: G1P0011 ? ?Social History  ? ?Socioeconomic  History  ? Marital status: Single  ?  Spouse name: Not on file  ? Number of children: 1  ? Years of education: Not on file  ? Highest education level: Some college, no degree  ?Occupational History  ?  Employer: [employer]  ?Tobacco Use  ? Smoking status: Never  ? Smokeless tobacco: Never  ?Vaping Use  ? Vaping Use: Never used  ?Substance and Sexual Activity  ? Alcohol use: Yes  ?  Comment: 2 a month  ? Drug use: No  ? Sexual activity: Not Currently  ?  Birth control/protection: Post-menopausal  ?Other Topics Concern  ? Not on file  ?Social History Narrative  ? 02/26/19  ? From: the area  ? Living: lives with roommate - they get along  ? 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   ? Family: Daughter - Marco Collie - good relationship  ?   ? Enjoys: watch TV and read  ?   ? Exercise: dancing, on her own  ? Diet: does not follow diabetic diet, not eating as much  ?   ? Safety  ? Seat belts: Yes   ? Guns: No  ? Safe in relationships: Yes   ?    ? ?Social Determinants of Health  ? ?Financial Resource Strain: Not on file  ?Food Insecurity: Not on file  ?Transportation Needs: Not on file  ?Physical Activity: Not on file  ?Stress: Not on file  ?Social Connections: Not on file  ?Intimate Partner Violence: Not on file  ? ? ?Family History  ?Problem Relation Age of Onset  ? Hypertension Mother   ? Diabetes Mother   ? Heart attack Mother 57  ? Asthma Mother   ?     Childhood  ? Arthritis Mother   ? Heart  disease Mother   ? Hyperlipidemia Mother   ? Hypertension Sister   ? Diabetes Sister   ? Asthma Sister 70  ?     asthma attack cause of death  ? Diabetes Brother   ? Hypertension Brother   ? Diabetes Brother   ? Hyp

## 2021-06-18 NOTE — Assessment & Plan Note (Signed)
She is having some urinary frequency and does have this history and notes some back pain.  She is going to call and reach out to her orthopedic.  Her exam is otherwise reassuring and she is seeing OB/GYN next week. ?

## 2021-06-18 NOTE — Assessment & Plan Note (Signed)
Endoscopy is scheduled coming up ordering INR for preprocedure ?

## 2021-06-18 NOTE — Patient Instructions (Addendum)
Check with insurance about the mail order option ? ?Urine symptoms ?- discuss with GYN ?- if they cannot do bladder scan or treat - let me know can refer to urology ? ?Back ?- reach out to ortho ?

## 2021-06-18 NOTE — Assessment & Plan Note (Addendum)
Hemoglobin A1c has increased.  Discussed increasing Trulicity, but at this time she would like to continue the current dose and work on diet and exercise.  Return in 3 months for recheck. ?

## 2021-06-18 NOTE — Assessment & Plan Note (Signed)
Complicated by intermittent incontinence.  I wonder about possible retention and overflow triggering the incontinence.  She does have some back pain, but exam otherwise with neuro neurological findings.  She will see the OB/GYN next week I am hoping that they will be able to do a bladder scan to see if she does have retention.  If not we will plan to refer her to urology she will follow-up after her OB/GYN appointment if this is needed. ?

## 2021-06-29 ENCOUNTER — Ambulatory Visit (INDEPENDENT_AMBULATORY_CARE_PROVIDER_SITE_OTHER): Payer: Federal, State, Local not specified - PPO | Admitting: Obstetrics & Gynecology

## 2021-06-29 ENCOUNTER — Encounter: Payer: Self-pay | Admitting: Obstetrics & Gynecology

## 2021-06-29 VITALS — BP 124/84 | HR 76 | Resp 16 | Ht 60.25 in | Wt 187.0 lb

## 2021-06-29 DIAGNOSIS — R350 Frequency of micturition: Secondary | ICD-10-CM | POA: Diagnosis not present

## 2021-06-29 DIAGNOSIS — M8589 Other specified disorders of bone density and structure, multiple sites: Secondary | ICD-10-CM | POA: Diagnosis not present

## 2021-06-29 DIAGNOSIS — Z01419 Encounter for gynecological examination (general) (routine) without abnormal findings: Secondary | ICD-10-CM | POA: Diagnosis not present

## 2021-06-29 DIAGNOSIS — Z780 Asymptomatic menopausal state: Secondary | ICD-10-CM | POA: Diagnosis not present

## 2021-06-29 DIAGNOSIS — N952 Postmenopausal atrophic vaginitis: Secondary | ICD-10-CM

## 2021-06-29 LAB — URINALYSIS, COMPLETE W/RFL CULTURE
Bacteria, UA: NONE SEEN /HPF
Bilirubin Urine: NEGATIVE
Casts: NONE SEEN /LPF
Crystals: NONE SEEN /HPF
Hgb urine dipstick: NEGATIVE
Hyaline Cast: NONE SEEN /LPF
Ketones, ur: NEGATIVE
Leukocyte Esterase: NEGATIVE
Nitrites, Initial: NEGATIVE
Protein, ur: NEGATIVE
RBC / HPF: NONE SEEN /HPF (ref 0–2)
Specific Gravity, Urine: 1.01 (ref 1.001–1.035)
WBC, UA: NONE SEEN /HPF (ref 0–5)
Yeast: NONE SEEN /HPF
pH: 6 (ref 5.0–8.0)

## 2021-06-29 LAB — NO CULTURE INDICATED

## 2021-06-29 MED ORDER — ESTRADIOL 0.1 MG/GM VA CREA: TOPICAL_CREAM | VAGINAL | 6 refills | Status: DC

## 2021-06-29 NOTE — Progress Notes (Signed)
? ? ?Vicki [name] 16-Jun-1960 [id number] ? ? ?History:    61 y.o. G2P1A1L1 ?  ?RP:  Established patient presenting for annual gyn exam  ?  ?HPI: Postmenopausal, well on no hormone replacement therapy.  No postmenopausal bleeding.  No pelvic pain.  No pain with intercourse.  Pap Neg 09/2019.  No h/o abnormal Pap.  Will repeat Pap at 3 years.  Using Estradiol cream vaginally on-off.  Breasts normal.  Mammo Neg 11/2020.  Urinary frequency and occasional mild SUI. Bowel movements normal. Body mass index increased to 36.22.  Bone Density Osteopenia 10/2019.  Repeat BD 11/2021.  Vit D, Ca++, weight bearing activities.  Health labs with family physician.  Colonoscopy in 2014, repeat at 9 yrs. ? ? ?Past medical history,surgical history, family history and social history were all reviewed and documented in the EPIC chart. ? ?Gynecologic History ?Patient's last menstrual period was 04/30/2012. ? ?Obstetric History ?OB History  ?Gravida Para Term Preterm AB Living  ?1 0 0 0 1 1  ?SAB IAB Ectopic Multiple Live Births  ?0 0 1 0 1  ?  ?# Outcome Date GA Lbr Len/2nd Weight Sex Delivery Anes PTL Lv  ?1 Ectopic           ? ? ? ?ROS: A ROS was performed and pertinent positives and negatives are included in the history. ? GENERAL: No fevers or chills. HEENT: No change in vision, no earache, sore throat or sinus congestion. NECK: No pain or stiffness. CARDIOVASCULAR: No chest pain or pressure. No palpitations. PULMONARY: No shortness of breath, cough or wheeze. GASTROINTESTINAL: No abdominal pain, nausea, vomiting or diarrhea, melena or bright red blood per rectum. GENITOURINARY: No urinary frequency, urgency, hesitancy or dysuria. MUSCULOSKELETAL: No joint or muscle pain, no back pain, no recent trauma. DERMATOLOGIC: No rash, no itching, no lesions. ENDOCRINE: No polyuria, polydipsia, no heat or cold intolerance. No recent change in weight. HEMATOLOGICAL: No anemia or easy bruising or bleeding. NEUROLOGIC: No headache, seizures,  numbness, tingling or weakness. PSYCHIATRIC: No depression, no loss of interest in normal activity or change in sleep pattern.  ?  ? ?Exam: ? ? ?BP 124/84   Pulse 76   Resp 16   Ht 5' 0.25" (1.53 m)   Wt 187 lb (84.8 kg)   LMP 04/30/2012   BMI 36.22 kg/m?  ? ?Body mass index is 36.22 kg/m?. ? ?General appearance : Well developed well nourished female. No acute distress ?HEENT: Eyes: no retinal hemorrhage or exudates,  Neck supple, trachea midline, no carotid bruits, no thyroidmegaly ?Lungs: Clear to auscultation, no rhonchi or wheezes, or rib retractions  ?Heart: Regular rate and rhythm, no murmurs or gallops ?Breast:Examined in sitting and supine position were symmetrical in appearance, no palpable masses or tenderness,  no skin retraction, no nipple inversion, no nipple discharge, no skin discoloration, no axillary or supraclavicular lymphadenopathy ?Abdomen: no palpable masses or tenderness, no rebound or guarding ?Extremities: no edema or skin discoloration or tenderness ? ?Pelvic: Vulva: Normal ?            Vagina: No gross lesions or discharge ? Cervix: No gross lesions or discharge ? Uterus  AV, normal size, shape and consistency, non-tender and mobile ? Adnexa  Without masses or tenderness ? Anus: Normal ? ?U/A Negative ? ? ?Assessment/Plan:  61 y.o. female for annual exam  ? ?1. Well female exam with routine gynecological exam ?Postmenopausal, well on no hormone replacement therapy.  No postmenopausal bleeding.  No pelvic pain.  No  pain with intercourse.  Pap Neg 09/2019.  No h/o abnormal Pap.  Will repeat Pap at 3 years.  Using Estradiol cream vaginally on-off.  Breasts normal.  Mammo Neg 11/2020.  Urinary frequency and occasional mild SUI. Bowel movements normal. Body mass index increased to 36.22. Bone Density Osteopenia 10/2019.  Repeat BD 11/2021.  Vit D, Ca++, weight bearing activities.  Health labs with family physician.  Colonoscopy in 2014, repeat at 9 yrs. ? ?2. Postmenopause ?Postmenopausal,  well on no hormone replacement therapy.  No postmenopausal bleeding.  No pelvic pain.  No pain with intercourse.  ? ?3. Postmenopausal atrophic vaginitis ?Using Estradiol cream vaginally on-off. Recommend to use twice a week.  Prescription sent to pharmacy. ? ?4. Osteopenia of multiple sites ?Continue Vit D supplement, Ca++ total 1.5 g/d, weight bearing physical activities.  Repeat BD in 11/2021. ?- DG Bone Density; Future ? ?5. Urinary frequency ?U/A Negative.  Reassured.  Some SUI.  Will start Kegel exercises.  Instruction given on how to do [facility]. ?- Urinalysis,Complete w/RFL Culture ? ?Other orders ?- Multiple Vitamin (MULTIVITAMIN PO); Take by mouth. ?- DANDELION ROOT PO; Take by mouth. tea ?- REFLEXIVE URINE CULTURE  ? ?Princess Bruins MD, 2:25 PM 06/29/2021 ? ?  ?

## 2021-06-30 ENCOUNTER — Encounter: Payer: Self-pay | Admitting: Family Medicine

## 2021-07-01 ENCOUNTER — Encounter: Payer: Self-pay | Admitting: Hematology & Oncology

## 2021-07-01 ENCOUNTER — Encounter: Payer: Self-pay | Admitting: Cardiovascular Disease

## 2021-07-01 ENCOUNTER — Ambulatory Visit: Payer: Federal, State, Local not specified - PPO | Admitting: Cardiovascular Disease

## 2021-07-01 DIAGNOSIS — R0789 Other chest pain: Secondary | ICD-10-CM | POA: Diagnosis not present

## 2021-07-01 DIAGNOSIS — R002 Palpitations: Secondary | ICD-10-CM

## 2021-07-01 DIAGNOSIS — I10 Essential (primary) hypertension: Secondary | ICD-10-CM | POA: Diagnosis not present

## 2021-07-01 NOTE — Assessment & Plan Note (Addendum)
History of essential hypertension with blood pressure initially measured at 174/86 falling to 152/84 by the end of the visit.  She is not on antihypertensive medications. ?

## 2021-07-01 NOTE — Patient Instructions (Signed)
Medication Instructions:  ?Your physician recommends that you continue on your current medications as directed. Please refer to the Current Medication list given to you today.  ?*If you need a refill on your cardiac medications before your next appointment, please call your pharmacy* ? ? ?Lab Work: ?None ?If you have labs (blood work) drawn today and your tests are completely normal, you will receive your results only by: ?MyChart Message (if you have MyChart) OR ?A paper copy in the mail ?If you have any lab test that is abnormal or we need to change your treatment, we will call you to review the results. ? ? ?Testing/Procedures: ?None ? ? ?Follow-Up: ?At [facility], you and your health needs are our priority.  As part of our continuing mission to provide you with exceptional heart care, we have created designated Provider Care Teams.  These Care Teams include your primary Cardiologist (physician) and Advanced Practice Providers (APPs -  Physician Assistants and Nurse Practitioners) who all work together to provide you with the care you need, when you need it. ? ?We recommend signing up for the patient portal called "MyChart".  Sign up information is provided on this After Visit Summary.  MyChart is used to connect with patients for Virtual Visits (Telemedicine).  Patients are able to view lab/test results, encounter notes, upcoming appointments, etc.  Non-urgent messages can be sent to your provider as well.   ?To learn more about what you can do with MyChart, go to [email or web address].   ? ?Your next appointment:   ?1 year(s) ? ?The format for your next appointment:   ?In Person ? ?Provider:   ?Quay Burow, MD   ? ? ?Other Instructions ? ? ?Important Information About Sugar ? ? ? ? ?  ?

## 2021-07-01 NOTE — Progress Notes (Signed)
? ? ? ?07/01/2021 ?[place]   ?12-30-1960  ?[id number] ? ?Primary Physician Lesleigh Noe, MD ?Primary Cardiologist: Lorretta Harp MD Vicki [name], [place] ? ?HPI:  Vicki [name] is a 61 y.o.  moderately overweight single African-American female mother of one child who was a Scientist, research (medical) (retired in 2020) and was referred by Dr. Maudie Mercury for cardiac evaluation because of new onset palpitations. I last saw her in the office   05/15/2020. Factors include treated hypertension and diabetes. Her mother did have a myocardial infarction at age 80. She has never had a heart attack or stroke and denies chest pain or shortness of breath. She is in her perimenopause time window is followed by Dr. Dellis Filbert. She drinks 2 cups of caffeinated beverages a day as well as alcohol on the weekends. She is not under a lot of stress recently. A 2-D echocardiogram was normal and an event monitor showed sinus rhythm sinus/sinus tachycardia and frequent PVCs. She had decreased her caffeine intake which improved her symptoms a year ago. However recently, she's noticed increased heart rate and palpitations are somewhat different quality than her previous symptoms. She also was diagnosed with NASH. ?  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  She is drinking less caffeine and alcohol since I saw her last her palpitations are much less frequent and noticeable.  She had some atypical chest pain when they removed her indwelling catheter but none since. ?  ?Since I saw her a year ago she is done well.  Her palpitations are less noticeable and and or frequent.  She was getting occasional atypical chest pain.  She has noticed some swelling in her left leg and had a negative venous Doppler study performed yesterday.  I did a coronary calcium score on her/8/22 which was 0. ?  ? ? ?Current Meds  ?Medication Sig  ? acetaminophen (TYLENOL) 325 MG tablet Take 650 mg by mouth every 6 (six) hours as needed.  ? Alum  Hydroxide-Mag Carbonate (GAVISCON EXTRA STRENGTH PO) Take 1 tablet by mouth 4 (four) times daily as needed.  ? Ascorbic Acid (VITAMIN C) 500 MG CAPS Take by mouth daily.  ? cholecalciferol (VITAMIN D3) 25 MCG (1000 UT) tablet Take 1,000 Units by mouth daily.  ? DANDELION ROOT PO Take by mouth. tea  ? Dulaglutide (TRULICITY) 3.64 WO/0.3OZ SOPN Inject 0.75 mg into the skin once a week.  ? estradiol (ESTRACE) 0.1 MG/GM vaginal cream 1 gram vaginally twice weekly  ? fexofenadine (ALLEGRA) 180 MG tablet Take 180 mg by mouth daily as needed for allergies or rhinitis.  ? folic acid (FOLVITE) 1 MG tablet Take two tablets by mouth once daily  ? furosemide (LASIX) 40 MG tablet Take 1 tablet by mouth once daily  ? glucose blood test strip Use as instructed by checking blood sugar 1 - 2 times daily Dx E11.9  ? MAGNESIUM GLYCINATE PO Take 200 mg by mouth daily.  ? mometasone (ELOCON) 0.1 % cream APPLY  CREAM TOPICALLY ONCE DAILY  ? Multiple Vitamin (MULTIVITAMIN PO) Take by mouth.  ? potassium chloride (KLOR-CON) 10 MEQ tablet TAKE 1  BY MOUTH ONCE DAILY  ? vitamin B-12 (CYANOCOBALAMIN) 500 MCG tablet Take 500 mcg by mouth daily.  ?  ? ?Allergies  ?Allergen Reactions  ? Other Other (See Comments)  ? Metformin And Related Itching  ?  Heart palpitation  ? Sulfa Antibiotics Itching  ? ? ?Social History  ? ?Socioeconomic History  ?  Marital status: Single  ?  Spouse name: Not on file  ? Number of children: 1  ? Years of education: Not on file  ? Highest education level: Some college, no degree  ?Occupational History  ?  Employer: [employer]  ?Tobacco Use  ? Smoking status: Never  ? Smokeless tobacco: Never  ?Vaping Use  ? Vaping Use: Never used  ?Substance and Sexual Activity  ? Alcohol use: Yes  ?  Comment: 2 a month  ? Drug use: No  ? Sexual activity: Yes  ?  Partners: Male  ?  Birth control/protection: Post-menopausal  ?Other Topics Concern  ? Not on file  ?Social History Narrative  ? 02/26/19  ? From: the area  ? Living: lives with  roommate - they get along  ? 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   ? Family: Daughter - Marco Collie - good relationship  ?   ? Enjoys: watch TV and read  ?   ? Exercise: dancing, on her own  ? Diet: does not follow diabetic diet, not eating as much  ?   ? Safety  ? Seat belts: Yes   ? Guns: No  ? Safe in relationships: Yes   ?    ? ?Social Determinants of Health  ? ?Financial Resource Strain: Not on file  ?Food Insecurity: Not on file  ?Transportation Needs: Not on file  ?Physical Activity: Not on file  ?Stress: Not on file  ?Social Connections: Not on file  ?Intimate Partner Violence: Not on file  ?  ? ?Review of Systems: ?General: negative for chills, fever, night sweats or weight changes.  ?Cardiovascular: negative for chest pain, dyspnea on exertion, edema, orthopnea, palpitations, paroxysmal nocturnal dyspnea or shortness of breath ?Dermatological: negative for rash ?Respiratory: negative for cough or wheezing ?Urologic: negative for hematuria ?Abdominal: negative for nausea, vomiting, diarrhea, bright red blood per rectum, melena, or hematemesis ?Neurologic: negative for visual changes, syncope, or dizziness ?All other systems reviewed and are otherwise negative except as noted above. ? ? ? ?Blood pressure (!) 152/84, pulse 78, height 5' (1.524 m), weight 189 lb 3.2 oz (85.8 kg), last menstrual period 04/30/2012, SpO2 97 %.  ?General appearance: alert and no distress ?Neck: no adenopathy, no carotid bruit, no JVD, supple, symmetrical, trachea midline, and thyroid not enlarged, symmetric, no tenderness/mass/nodules ?Lungs: clear to auscultation bilaterally ?Heart: regular rate and rhythm, S1, S2 normal, no murmur, click, rub or gallop ?Extremities: extremities normal, atraumatic, no cyanosis or edema ?Pulses: 2+ and symmetric ?Skin: Skin color, texture, turgor normal. No rashes or lesions ?Neurologic: Grossly normal ? ?EKG sinus rhythm at 78 with left axis deviation.  I personally reviewed this  EKG. ? ?ASSESSMENT AND PLAN:  ? ?Palpitations ?History of palpitations with event monitoring in the past showing PVCs.  She does drink caffeine.  I offered her low-dose beta-blockade but she declined. ? ?Essential hypertension ?History of essential hypertension with blood pressure initially measured at 174/86 falling to 152/84 by the end of the visit.  She is not on antihypertensive medications. ? ?Atypical chest pain ?History of atypical chest pain in the past with recent coronary calcium score measured 06/12/2020 of 0. ? ? ? ? ?Lorretta Harp MD FACP,FACC,FAHA, FSCAI ?07/01/2021 ?3:50 PM ?

## 2021-07-01 NOTE — Assessment & Plan Note (Signed)
History of atypical chest pain in the past with recent coronary calcium score measured 06/12/2020 of 0. ?

## 2021-07-01 NOTE — Assessment & Plan Note (Signed)
" >>  ASSESSMENT AND PLAN FOR ESSENTIAL HYPERTENSION WRITTEN ON 07/01/2021  3:46 PM BY Kieu Quiggle J, MD  History of essential hypertension with blood pressure initially measured at 174/86 falling to 152/84 by the end of the visit.  She is not on antihypertensive medications. "

## 2021-07-01 NOTE — Assessment & Plan Note (Signed)
History of palpitations with event monitoring in the past showing PVCs.  She does drink caffeine.  I offered her low-dose beta-blockade but she declined. ?

## 2021-07-20 ENCOUNTER — Telehealth: Payer: Self-pay | Admitting: Cardiovascular Disease

## 2021-07-20 ENCOUNTER — Telehealth: Payer: Self-pay | Admitting: *Deleted

## 2021-07-20 DIAGNOSIS — M5451 Vertebrogenic low back pain: Secondary | ICD-10-CM | POA: Diagnosis not present

## 2021-07-20 DIAGNOSIS — M5136 Other intervertebral disc degeneration, lumbar region: Secondary | ICD-10-CM | POA: Diagnosis not present

## 2021-07-20 DIAGNOSIS — M48061 Spinal stenosis, lumbar region without neurogenic claudication: Secondary | ICD-10-CM | POA: Diagnosis not present

## 2021-07-20 NOTE — Telephone Encounter (Signed)
Returned call to pt,unable to reach. LMOVM for pt to call office and clarify pts question as her prior vm was difficult to understand. ?

## 2021-07-20 NOTE — Telephone Encounter (Signed)
LMOM - okay to take prednisone, just be aware that it could cause temporary increase in BP or blood sugar. ?

## 2021-07-20 NOTE — Telephone Encounter (Signed)
Patient walked-was prescribed prednisone pack and wanted to confirm this is ok to take with current medications.   ? ?Routed to pharmD ? ?Request call back to [phone], ok to leave VM ?

## 2021-07-21 ENCOUNTER — Telehealth: Payer: Self-pay | Admitting: *Deleted

## 2021-07-21 NOTE — Telephone Encounter (Signed)
Patient returning call and req call back. States that her power went out yesterday and any v/m left was unclear.  ? ?

## 2021-07-21 NOTE — Telephone Encounter (Signed)
Received a call from patient inquiring whether ok to take Prednisone pack for pinched nerved as well as receive a Cortisone shot by Dr Tonita Cong.  Dr Marin Olp approves and said this is fine.  LMAM as patient instructed.   ?

## 2021-07-23 ENCOUNTER — Telehealth: Payer: Self-pay | Admitting: Cardiovascular Disease

## 2021-07-23 NOTE — Telephone Encounter (Signed)
Pt returning call from nurse that was placed on 5/16. Pt states that her power went out so therefore she missed the call. Please advise

## 2021-07-23 NOTE — Telephone Encounter (Signed)
Called patient, advised of note from [facility] on 05/16- okay to take prednisone.  Patient verbalized understanding.

## 2021-08-05 ENCOUNTER — Ambulatory Visit: Payer: Federal, State, Local not specified - PPO | Admitting: Family Medicine

## 2021-08-05 VITALS — BP 128/78 | HR 79 | Temp 97.4°F | Ht 61.0 in | Wt 184.1 lb

## 2021-08-05 DIAGNOSIS — D61818 Other pancytopenia: Secondary | ICD-10-CM

## 2021-08-05 DIAGNOSIS — M3119 Other thrombotic microangiopathy: Secondary | ICD-10-CM | POA: Diagnosis not present

## 2021-08-05 DIAGNOSIS — E119 Type 2 diabetes mellitus without complications: Secondary | ICD-10-CM

## 2021-08-05 DIAGNOSIS — K7581 Nonalcoholic steatohepatitis (NASH): Secondary | ICD-10-CM

## 2021-08-05 DIAGNOSIS — K7460 Unspecified cirrhosis of liver: Secondary | ICD-10-CM

## 2021-08-05 LAB — CBC WITH DIFFERENTIAL/PLATELET
Basophils Absolute: 0 10*3/uL (ref 0.0–0.1)
Basophils Relative: 0.7 % (ref 0.0–3.0)
Eosinophils Absolute: 0.4 10*3/uL (ref 0.0–0.7)
Eosinophils Relative: 8 % — ABNORMAL HIGH (ref 0.0–5.0)
HCT: 40 % (ref 36.0–46.0)
Hemoglobin: 13.4 g/dL (ref 12.0–15.0)
Lymphocytes Relative: 29.6 % (ref 12.0–46.0)
Lymphs Abs: 1.3 10*3/uL (ref 0.7–4.0)
MCHC: 33.5 g/dL (ref 30.0–36.0)
MCV: 91.2 fl (ref 78.0–100.0)
Monocytes Absolute: 0.3 10*3/uL (ref 0.1–1.0)
Monocytes Relative: 6.9 % (ref 3.0–12.0)
Neutro Abs: 2.4 10*3/uL (ref 1.4–7.7)
Neutrophils Relative %: 54.8 % (ref 43.0–77.0)
Platelets: 149 10*3/uL — ABNORMAL LOW (ref 150.0–400.0)
RBC: 4.39 Mil/uL (ref 3.87–5.11)
RDW: 13.4 % (ref 11.5–15.5)
WBC: 4.4 10*3/uL (ref 4.0–10.5)

## 2021-08-05 LAB — COMPREHENSIVE METABOLIC PANEL
ALT: 24 U/L (ref 0–35)
AST: 30 U/L (ref 0–37)
Albumin: 4.2 g/dL (ref 3.5–5.2)
Alkaline Phosphatase: 119 U/L — ABNORMAL HIGH (ref 39–117)
BUN: 8 mg/dL (ref 6–23)
CO2: 25 mEq/L (ref 19–32)
Calcium: 10 mg/dL (ref 8.4–10.5)
Chloride: 104 mEq/L (ref 96–112)
Creatinine, Ser: 0.74 mg/dL (ref 0.40–1.20)
GFR: 87.02 mL/min (ref >60.00)
Glucose, Bld: 161 mg/dL — ABNORMAL HIGH (ref 70–99)
Potassium: 3.8 mEq/L (ref 3.5–5.1)
Sodium: 137 mEq/L (ref 135–145)
Total Bilirubin: 1.4 mg/dL — ABNORMAL HIGH (ref 0.2–1.2)
Total Protein: 7.4 g/dL (ref 6.0–8.3)

## 2021-08-05 LAB — PROTIME-INR
INR: 1.2 ratio — ABNORMAL HIGH (ref 0.8–1.0)
Prothrombin Time: 13.1 s (ref 9.6–13.1)

## 2021-08-05 NOTE — Progress Notes (Signed)
Subjective:     Vicki [name] is a 61 y.o. female presenting for Pre-op Exam     HPI  #EGD - scheduled for this in June - anticipating getting a phone call and wants to get updated labs   #Acquired pancytopenia No bleeding - does have dark stool - no blood in the stool - no bruising that she has noticed  #cough - took a cough pill and this resolved   #Diabetes Currently taking trulicity 5.03 mg  Using medications without difficulties: No Hypoglycemic episodes:No  Hyperglycemic episodes:Yes  Feet problems:No  Blood Sugars averaging: elevated >200 Last HgbA1c:  Lab Results  Component Value Date   HGBA1C 7.0 (A) 06/18/2021   Sugars have been elevated since getting steroids for her back  Was over 300 on the steroids Today 173 fasting  Diabetes Health Maintenance Due:    Diabetes Health Maintenance Due  Topic Date Due   FOOT EXAM  08/14/2020   OPHTHALMOLOGY EXAM  01/13/2021   URINE MICROALBUMIN  08/06/2021   HEMOGLOBIN A1C  12/18/2021       Review of Systems  Constitutional:  Negative for chills and fever.  HENT:  Positive for congestion and postnasal drip.   Respiratory:  Positive for cough. Negative for chest tightness and shortness of breath.   Cardiovascular:  Negative for chest pain.  Gastrointestinal:  Negative for blood in stool, diarrhea, nausea and vomiting.    Social History   Tobacco Use  Smoking Status Never  Smokeless Tobacco Never        Objective:    BP Readings from Last 3 Encounters:  08/05/21 128/78  07/01/21 (!) 152/84  06/29/21 124/84   Wt Readings from Last 3 Encounters:  08/05/21 184 lb 2 oz (83.5 kg)  07/01/21 189 lb 3.2 oz (85.8 kg)  06/29/21 187 lb (84.8 kg)    BP 128/78   Pulse 79   Temp (!) 97.4 F (36.3 C) (Temporal)   Ht 5' 1"  (1.549 m)   Wt 184 lb 2 oz (83.5 kg)   LMP 04/30/2012   SpO2 98%   BMI 34.79 kg/m    Physical Exam Constitutional:      General: She is not in acute distress.     Appearance: She is well-developed. She is not diaphoretic.  HENT:     Right Ear: Tympanic membrane and external ear normal.     Left Ear: Tympanic membrane and external ear normal.     Nose: Nose normal. No rhinorrhea.     Mouth/Throat:     Mouth: Mucous membranes are moist.     Pharynx: No posterior oropharyngeal erythema.  Eyes:     Extraocular Movements: Extraocular movements intact.     Conjunctiva/sclera: Conjunctivae normal.     Pupils: Pupils are equal, round, and reactive to light.  Cardiovascular:     Rate and Rhythm: Normal rate and regular rhythm.  Pulmonary:     Effort: Pulmonary effort is normal. No respiratory distress.     Breath sounds: Normal breath sounds. No wheezing.  Musculoskeletal:     Cervical back: Neck supple.  Skin:    General: Skin is warm and dry.     Capillary Refill: Capillary refill takes less than 2 seconds.  Neurological:     Mental Status: She is alert. Mental status is at baseline.  Psychiatric:        Mood and Affect: Mood normal.        Behavior: Behavior normal.  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  Last platelets were improving.  We will recheck platelets and coagulation.       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  At this time it is safe for her to proceed with this procedure.  We will get labs to evaluate history of TTP.       Relevant Orders   Protime-INR   Comprehensive metabolic panel     Endocrine   Controlled type 2 diabetes mellitus without complication (HCC) (Chronic)    CBG elevated in setting of steroids but last hgb a1c was 7. Increase Trulicity 3.38>2.5 mg. Return 2 months for recheck. Update in 3-4 weeks for dose  increase.          Hematopoietic and Hemostatic   Acquired pancytopenia ([place])   Relevant Orders   CBC with Differential     Return in about 2 months (around 10/05/2021).  Lesleigh Noe, MD

## 2021-08-05 NOTE — Assessment & Plan Note (Signed)
CBG elevated in setting of steroids but last hgb a1c was 7. Increase Trulicity 5.91>6.3 mg. Return 2 months for recheck. Update in 3-4 weeks for dose increase.

## 2021-08-05 NOTE — Patient Instructions (Addendum)
Diabetes - Start taking 2 injections of the 0.75 mg weekly (dose 1.48m) - update after 3 weeks if tolerating can either increase or just send in the 1.5 mg dose  Sinus drainage - can try flonase and allergy medication

## 2021-08-05 NOTE — Assessment & Plan Note (Signed)
She is EGD for monitoring.  At this time it is safe for her to proceed with this procedure.  We will get labs to evaluate history of TTP.

## 2021-08-05 NOTE — Assessment & Plan Note (Signed)
She has been stable off of treatment.  Last platelets were improving.  We will recheck platelets and coagulation.

## 2021-08-12 ENCOUNTER — Telehealth: Payer: Self-pay | Admitting: Family Medicine

## 2021-08-12 ENCOUNTER — Ambulatory Visit: Payer: Federal, State, Local not specified - PPO | Admitting: Physician Assistant

## 2021-08-12 ENCOUNTER — Encounter: Payer: Self-pay | Admitting: Physician Assistant

## 2021-08-12 DIAGNOSIS — L700 Acne vulgaris: Secondary | ICD-10-CM

## 2021-08-12 DIAGNOSIS — Z79899 Other long term (current) drug therapy: Secondary | ICD-10-CM

## 2021-08-12 DIAGNOSIS — L732 Hidradenitis suppurativa: Secondary | ICD-10-CM

## 2021-08-12 MED ORDER — TRETINOIN 0.05 % EX CREA: TOPICAL_CREAM | Freq: Every day | CUTANEOUS | 3 refills | Status: DC

## 2021-08-12 NOTE — Telephone Encounter (Signed)
Pt called back and I made her aware of message below:  Your platelets are improving. Your clotting factors are stable. One of your liver tests is slightly elevated. You may want to check in with your gastroenterologist. I'm not too worried, but it hasn't been high in the past.    Lesleigh Noe  Pt aware, she said she will let her gastroenterologist. She didn't have any further questions

## 2021-08-12 NOTE — Progress Notes (Signed)
   Follow-Up Visit   Subjective  Vicki [name] is a 61 y.o. female who presents for the following: Skin Cancer (Patient here today to have a mole check on her right posterior thigh x 1 year that's getting larger and harder. Per patient no bleeding no pain. No personal history or family history of atypical moles, melanoma or non mole skin cancer.  Per patient she's dealing with a breakout on her right and left cheek x 6 months that comes and goes no current treatment. ). She has been diagnosed with hidradenitis x years. It all started when she was a teenager. The cysts are very painful and drain daily. She gets them mostly in her inguinal and suprapubic area. She also gets them under her breasts, both axillae and buttocks. She has been given a prescription for antibiotics (keflex) that hasn't really helped much. She is miserable and would like to explore treatment options.    The following portions of the chart were reviewed this encounter and updated as appropriate:  Tobacco  Allergies  Meds  Problems  Med Hx  Surg Hx  Fam Hx      Objective  Well appearing patient in no apparent distress; mood and affect are within normal limits.  A full examination was performed including scalp, head, eyes, ears, nose, lips, neck, chest, axillae, abdomen, back, buttocks, bilateral upper extremities, bilateral lower extremities, hands, feet, fingers, toes, fingernails, and toenails. All findings within normal limits unless otherwise noted below.  Head - Anterior (Face) Small open comedomes  Gluteal Crease, Left Axilla, Left Inframammary Fold, Pubic, Right Axilla, Right Inframammary Fold Active, draining cysts with deep inflammation and tunneling.    Assessment & Plan  Acne vulgaris Head - Anterior (Face)  tretinoin (RETIN-A) 0.05 % cream - Head - Anterior (Face) Apply topically at bedtime.  Hidradenitis suppurativa Pubic; Left Inframammary Fold; Right Inframammary Fold; Left Axilla; Right  Axilla; Gluteal Crease  Humira New start   Hepatitis B surface antibody,quantitative - Gluteal Crease, Left Axilla, Left Inframammary Fold, Pubic, Right Axilla, Right Inframammary Fold  Hepatitis B surface antigen - Gluteal Crease, Left Axilla, Left Inframammary Fold, Pubic, Right Axilla, Right Inframammary Fold  Hepatitis B core antibody, total - Gluteal Crease, Left Axilla, Left Inframammary Fold, Pubic, Right Axilla, Right Inframammary Fold  Hepatitis C antibody - Gluteal Crease, Left Axilla, Left Inframammary Fold, Pubic, Right Axilla, Right Inframammary Fold  QuantiFERON-TB Gold Plus - Gluteal Crease, Left Axilla, Left Inframammary Fold, Pubic, Right Axilla, Right Inframammary Fold  ANA - Gluteal Crease, Left Axilla, Left Inframammary Fold, Pubic, Right Axilla, Right Inframammary Fold  Encounter for long-term (current) use of medications  Related Procedures Hepatitis B surface antibody,quantitative Hepatitis B surface antigen Hepatitis B core antibody, total Hepatitis C antibody QuantiFERON-TB Gold Plus ANA    I, Anyeli Hockenbury, PA-C, have reviewed all documentation's for this visit.  The documentation on 08/12/21 for the exam, diagnosis, procedures and orders are all accurate and complete.

## 2021-08-16 ENCOUNTER — Encounter: Payer: Self-pay | Admitting: Family Medicine

## 2021-08-17 DIAGNOSIS — Z6830 Body mass index (BMI) 30.0-30.9, adult: Secondary | ICD-10-CM | POA: Diagnosis not present

## 2021-08-17 DIAGNOSIS — E785 Hyperlipidemia, unspecified: Secondary | ICD-10-CM | POA: Diagnosis not present

## 2021-08-17 DIAGNOSIS — Z7951 Long term (current) use of inhaled steroids: Secondary | ICD-10-CM | POA: Diagnosis not present

## 2021-08-17 DIAGNOSIS — D696 Thrombocytopenia, unspecified: Secondary | ICD-10-CM | POA: Diagnosis not present

## 2021-08-17 DIAGNOSIS — K2950 Unspecified chronic gastritis without bleeding: Secondary | ICD-10-CM | POA: Diagnosis not present

## 2021-08-17 DIAGNOSIS — E669 Obesity, unspecified: Secondary | ICD-10-CM | POA: Diagnosis not present

## 2021-08-17 DIAGNOSIS — K7581 Nonalcoholic steatohepatitis (NASH): Secondary | ICD-10-CM | POA: Diagnosis not present

## 2021-08-17 DIAGNOSIS — K449 Diaphragmatic hernia without obstruction or gangrene: Secondary | ICD-10-CM | POA: Diagnosis not present

## 2021-08-17 DIAGNOSIS — K7460 Unspecified cirrhosis of liver: Secondary | ICD-10-CM | POA: Diagnosis not present

## 2021-08-17 DIAGNOSIS — K219 Gastro-esophageal reflux disease without esophagitis: Secondary | ICD-10-CM | POA: Diagnosis not present

## 2021-08-17 DIAGNOSIS — R1013 Epigastric pain: Secondary | ICD-10-CM | POA: Diagnosis not present

## 2021-08-17 DIAGNOSIS — E119 Type 2 diabetes mellitus without complications: Secondary | ICD-10-CM | POA: Diagnosis not present

## 2021-08-17 DIAGNOSIS — I10 Essential (primary) hypertension: Secondary | ICD-10-CM | POA: Diagnosis not present

## 2021-08-26 ENCOUNTER — Ambulatory Visit: Payer: Federal, State, Local not specified - PPO | Admitting: Family Medicine

## 2021-09-01 DIAGNOSIS — E119 Type 2 diabetes mellitus without complications: Secondary | ICD-10-CM | POA: Diagnosis not present

## 2021-09-01 DIAGNOSIS — K219 Gastro-esophageal reflux disease without esophagitis: Secondary | ICD-10-CM | POA: Diagnosis not present

## 2021-09-01 DIAGNOSIS — E785 Hyperlipidemia, unspecified: Secondary | ICD-10-CM | POA: Diagnosis not present

## 2021-09-01 DIAGNOSIS — R600 Localized edema: Secondary | ICD-10-CM | POA: Diagnosis not present

## 2021-09-01 DIAGNOSIS — Z79899 Other long term (current) drug therapy: Secondary | ICD-10-CM | POA: Diagnosis not present

## 2021-09-01 DIAGNOSIS — I10 Essential (primary) hypertension: Secondary | ICD-10-CM | POA: Diagnosis not present

## 2021-09-01 DIAGNOSIS — R635 Abnormal weight gain: Secondary | ICD-10-CM | POA: Diagnosis not present

## 2021-09-01 DIAGNOSIS — K7460 Unspecified cirrhosis of liver: Secondary | ICD-10-CM | POA: Diagnosis not present

## 2021-09-01 DIAGNOSIS — Z6833 Body mass index (BMI) 33.0-33.9, adult: Secondary | ICD-10-CM | POA: Diagnosis not present

## 2021-09-01 DIAGNOSIS — K7581 Nonalcoholic steatohepatitis (NASH): Secondary | ICD-10-CM | POA: Diagnosis not present

## 2021-09-03 ENCOUNTER — Other Ambulatory Visit: Payer: Self-pay | Admitting: Hematology & Oncology

## 2021-09-03 ENCOUNTER — Telehealth: Payer: Self-pay

## 2021-09-03 NOTE — Telephone Encounter (Signed)
Pt called inquiring if she needed to continue taking potassium and lasix. Dr.Ennever would like pt to follow up with her pcp regarding these medications and if they want her to continue. Called and lm with patient regarding this.

## 2021-09-04 ENCOUNTER — Other Ambulatory Visit: Payer: Self-pay | Admitting: Hematology & Oncology

## 2021-09-09 ENCOUNTER — Telehealth: Payer: Self-pay | Admitting: Cardiovascular Disease

## 2021-09-09 ENCOUNTER — Other Ambulatory Visit: Payer: Self-pay | Admitting: Family Medicine

## 2021-09-09 DIAGNOSIS — E119 Type 2 diabetes mellitus without complications: Secondary | ICD-10-CM

## 2021-09-09 NOTE — Telephone Encounter (Signed)
Pt c/o of Chest Pain: STAT if CP now or developed within 24 hours  1. Are you having CP right now? Yes  2. Are you experiencing any other symptoms (ex. SOB, nausea, vomiting, sweating)? No  3. How long have you been experiencing CP? Few days  4. Is your CP continuous or coming and going? Come and go  5. Have you taken Nitroglycerin? No ?

## 2021-09-09 NOTE — Telephone Encounter (Signed)
Spoke to patient. She states she having little  discomfort in her chest  "squeezing" off/on  lasting about  1 minute or so. No radiation no shortness of breath. It occurs at different times a the day for 2 -3 days.   At present time she is helping her mother today - at doctor appointment.   No active discomfort now.   If become worse seek assistance with ER. Appointment schedule tomorrow with Dr Gwenlyn Found  at 3:30 pm 09/10/21. Patient verbalized understanding.

## 2021-09-10 ENCOUNTER — Encounter: Payer: Self-pay | Admitting: Cardiovascular Disease

## 2021-09-10 ENCOUNTER — Ambulatory Visit: Payer: Federal, State, Local not specified - PPO | Admitting: Cardiovascular Disease

## 2021-09-10 VITALS — BP 126/78 | HR 78 | Ht 61.0 in | Wt 181.0 lb

## 2021-09-10 DIAGNOSIS — I10 Essential (primary) hypertension: Secondary | ICD-10-CM | POA: Diagnosis not present

## 2021-09-10 DIAGNOSIS — R0789 Other chest pain: Secondary | ICD-10-CM | POA: Diagnosis not present

## 2021-09-10 DIAGNOSIS — R011 Cardiac murmur, unspecified: Secondary | ICD-10-CM | POA: Diagnosis not present

## 2021-09-10 DIAGNOSIS — R072 Precordial pain: Secondary | ICD-10-CM | POA: Diagnosis not present

## 2021-09-10 MED ORDER — METOPROLOL TARTRATE 100 MG PO TABS: 100.0000 mg | ORAL_TABLET | Freq: Once | ORAL | 0 refills | Status: DC

## 2021-09-10 NOTE — Progress Notes (Signed)
09/10/2021 Vicki [name]   11-27-1960  [id number]  Primary Physician Lesleigh Noe, MD Primary Cardiologist: Lorretta Harp MD Vicki [name], [place]  HPI:  Vicki [name] is a 61 y.o.  moderately overweight single African-American female mother of one child who was a Scientist, research (medical) (retired in 2020) and was referred by Dr. Maudie Mercury for cardiac evaluation because of new onset palpitations. I last saw her in the office   07/01/2021. Factors include treated hypertension and diabetes. Her mother did have a myocardial infarction at age 51. She has never had a heart attack or stroke and denies chest pain or shortness of breath. She is in her perimenopause time window is followed by Dr. Dellis Filbert. She drinks 2 cups of caffeinated beverages a day as well as alcohol on the weekends. She is not under a lot of stress recently. A 2-D echocardiogram was normal and an event monitor showed sinus rhythm sinus/sinus tachycardia and frequent PVCs. She had decreased her caffeine intake which improved her symptoms a year ago. However recently, she's noticed increased heart rate and palpitations are somewhat different quality than her previous symptoms. She also was diagnosed with NASH.   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  She is drinking less caffeine and alcohol since I saw her last her palpitations are much less frequent and noticeable.  She had some atypical chest pain when they removed her indwelling catheter but none since.   Since I saw her in the office 3 months ago she continues to do well.  She does have atypical chest pain that occurs almost on a daily basis.  She had endoscopy performed 08/17/2021 that showed a hiatal hernia with some gastritis.  She did have a coronary calcium score performed 06/12/2020 that was 0.   Current Meds  Medication Sig   acetaminophen (TYLENOL) 325 MG tablet Take 650 mg by mouth every 6 (six) hours as needed.   Alum Hydroxide-Mag Carbonate  (GAVISCON EXTRA STRENGTH PO) Take 1 tablet by mouth 4 (four) times daily as needed.   Ascorbic Acid (VITAMIN C) 500 MG CAPS Take by mouth daily.   cholecalciferol (VITAMIN D3) 25 MCG (1000 UT) tablet Take 1,000 Units by mouth daily.   fexofenadine (ALLEGRA) 180 MG tablet Take 180 mg by mouth daily as needed for allergies or rhinitis.   folic acid (FOLVITE) 1 MG tablet Take two tablets by mouth once daily   glucose blood test strip Use as instructed by checking blood sugar 1 - 2 times daily Dx E11.9   MAGNESIUM GLYCINATE PO Take 200 mg by mouth daily.   mometasone (ELOCON) 0.1 % cream APPLY  CREAM TOPICALLY ONCE DAILY   Multiple Vitamin (MULTIVITAMIN PO) Take by mouth.   TRULICITY 0.99 IP/3.8SN SOPN INJECT 0.75 MG SUBCUTANEOUSLY ONCE A WEEK (Patient taking differently: 1.5 mg.)   vitamin B-12 (CYANOCOBALAMIN) 500 MCG tablet Take 500 mcg by mouth daily.   [DISCONTINUED] DANDELION ROOT PO Take by mouth. tea   [DISCONTINUED] estradiol (ESTRACE) 0.1 MG/GM vaginal cream 1 gram vaginally twice weekly   [DISCONTINUED] furosemide (LASIX) 40 MG tablet Take 1 tablet by mouth once daily   [DISCONTINUED] potassium chloride (KLOR-CON) 10 MEQ tablet TAKE 1  BY MOUTH ONCE DAILY   [DISCONTINUED] tretinoin (RETIN-A) 0.05 % cream Apply topically at bedtime.     Allergies  Allergen Reactions   Other Other (See Comments)   Metformin And Related Itching    Heart palpitation   Sulfa  Antibiotics Itching    Social History   Socioeconomic History   Marital status: Single    Spouse name: Not on file   Number of children: 1   Years of education: Not on file   Highest education level: Some college, no degree  Occupational History    Employer: [employer]  Tobacco Use   Smoking status: Never   Smokeless tobacco: Never  Vaping Use   Vaping Use: Never used  Substance and Sexual Activity   Alcohol use: Yes    Comment: 2 a month   Drug use: No   Sexual activity: Yes    Partners: Male    Birth  control/protection: Post-menopausal  Other Topics Concern   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      Family: Daughter - Hospital doctor - good relationship      Enjoys: watch TV and read      Exercise: dancing, on her own   Diet: does not follow diabetic diet, not eating as much      Safety   Seat belts: Yes    Guns: No   Safe in relationships: Yes        Social Determinants of Radio broadcast assistant Strain: Not on file  Food Insecurity: Not on file  Transportation Needs: Not on file  Physical Activity: Not on file  Stress: Not on file  Social Connections: Not on file  Intimate Partner Violence: Not on file     Review of Systems: General: negative for chills, fever, night sweats or weight changes.  Cardiovascular: negative for chest pain, dyspnea on exertion, edema, orthopnea, palpitations, paroxysmal nocturnal dyspnea or shortness of breath Dermatological: negative for rash Respiratory: negative for cough or wheezing Urologic: negative for hematuria Abdominal: negative for nausea, vomiting, diarrhea, bright red blood per rectum, melena, or hematemesis Neurologic: negative for visual changes, syncope, or dizziness All other systems reviewed and are otherwise negative except as noted above.    Blood pressure 126/78, pulse 78, height 5' 1"  (1.549 m), weight 181 lb (82.1 kg), last menstrual period 04/30/2012.  General appearance: alert and no distress Neck: no adenopathy, no carotid bruit, no JVD, supple, symmetrical, trachea midline, and thyroid not enlarged, symmetric, no tenderness/mass/nodules Lungs: clear to auscultation bilaterally Heart: regular rate and rhythm, S1, S2 normal, no murmur, click, rub or gallop Extremities: extremities normal, atraumatic, no cyanosis or edema Pulses: 2+ and symmetric Skin: Skin color, texture, turgor normal.  No rashes or lesions Neurologic: Grossly normal  EKG sinus rhythm at 78 with low limb voltage and left axis deviation.  She had poor R wave progression.  Personally reviewed this EKG.  ASSESSMENT AND PLAN:   Cardiac murmur History of cardiac murmur with an echo performed 06/10/2019 that showed no valvular abnormalities.  I do not hear a murmur on exam.  Essential hypertension History of essential hypertension a blood pressure measured today at 126/78.  She is not on antihypertensive medications.  Hyperlipidemia History of hyperlipidemia not on statin therapy with lipid profile performed 06/18/2021 revealing a total cholesterol 207, LDL 115 HDL of 80.  Her LDL has increased from 84-1 15 over the last year probably as result of dietary indiscretion.  Atypical chest pain History of atypical chest pain which occurs on a daily basis.  She did have an EGD performed 08/17/2021 that  showed a hiatal hernia.  She also had a coronary calcium score performed 06/12/2020 that was 0.  I am going to get a coronary CTA to further evaluate.     Lorretta Harp MD FACP,FACC,FAHA, [facility] 09/10/2021 3:42 PM

## 2021-09-10 NOTE — Assessment & Plan Note (Signed)
" >>  ASSESSMENT AND PLAN FOR ESSENTIAL HYPERTENSION WRITTEN ON 09/10/2021  3:41 PM BY Ermel Verne J, MD  History of essential hypertension a blood pressure measured today at 126/78.  She is not on antihypertensive medications. "

## 2021-09-10 NOTE — Assessment & Plan Note (Signed)
History of essential hypertension a blood pressure measured today at 126/78.  She is not on antihypertensive medications.

## 2021-09-10 NOTE — Assessment & Plan Note (Signed)
History of atypical chest pain which occurs on a daily basis.  She did have an EGD performed 08/17/2021 that showed a hiatal hernia.  She also had a coronary calcium score performed 06/12/2020 that was 0.  I am going to get a coronary CTA to further evaluate.

## 2021-09-10 NOTE — Assessment & Plan Note (Signed)
History of cardiac murmur with an echo performed 06/10/2019 that showed no valvular abnormalities.  I do not hear a murmur on exam.

## 2021-09-10 NOTE — Patient Instructions (Addendum)
Medication Instructions:  Your physician recommends that you continue on your current medications as directed. Please refer to the Current Medication list given to you today.  *If you need a refill on your cardiac medications before your next appointment, please call your pharmacy*   Lab Work: Your physician recommends that you have labs drawn today: BMET  Your physician recommends that you return for lab work in: 3 months FASTING lipid/liver panel  If you have labs (blood work) drawn today and your tests are completely normal, you will receive your results only by: [place] (if you have [place]) OR A paper copy in the mail If you have any lab test that is abnormal or we need to change your treatment, we will call you to review the results.   Testing/Procedures: See below   Follow-Up: At [facility], you and your health needs are our priority.  As part of our continuing mission to provide you with exceptional heart care, we have created designated Provider Care Teams.  These Care Teams include your primary Cardiologist (physician) and Advanced Practice Providers (APPs -  Physician Assistants and Nurse Practitioners) who all work together to provide you with the care you need, when you need it.  We recommend signing up for the patient portal called "MyChart".  Sign up information is provided on this After Visit Summary.  MyChart is used to connect with patients for Virtual Visits (Telemedicine).  Patients are able to view lab/test results, encounter notes, upcoming appointments, etc.  Non-urgent messages can be sent to your provider as well.   To learn more about what you can do with MyChart, go to [email or web address].    Your next appointment:   3 month(s)  The format for your next appointment:   In Person  Provider:   Fabian Sharp, PA-C, Sande Rives, PA-C, Caron Presume, PA-C, Jory Sims, DNP, ANP, Almyra Deforest, PA-C, or Diona Browner, NP       Then,  Quay Burow, MD will plan to see you again in 12 month(s).   Other Instructions   Your cardiac CT will be scheduled at one of the below locations:   [facility] [address] [place], [place] [address] [phone]   If scheduled at [facility], please arrive at the [facility] Children's Entrance (Entrance C2) of [facility] 30 minutes prior to test start time. You can use the FREE valet parking offered at entrance C (encouraged to control the heart rate for the test)  Proceed to the [facility] Radiology Department (first floor) to check-in and test prep.  All radiology patients and guests should use entrance C2 at [facility], accessed from [facility], even though the hospital's physical address listed is [address].     Please follow these instructions carefully (unless otherwise directed):   On the Night Before the Test: Be sure to Drink plenty of water. Do not consume any caffeinated/decaffeinated beverages or chocolate 12 hours prior to your test. Do not take any antihistamines 12 hours prior to your test.  On the Day of the Test: Drink plenty of water until 1 hour prior to the test. Do not eat any food 4 hours prior to the test. You may take your regular medications prior to the test.  Take metoprolol (Lopressor) two hours prior to test. HOLD Furosemide/Hydrochlorothiazide morning of the test. FEMALES- please wear underwire-free bra if available, avoid dresses & tight clothing       After  the Test: Drink plenty of water. After receiving IV contrast, you may experience a mild flushed feeling. This is normal. On occasion, you may experience a mild rash up to 24 hours after the test. This is not dangerous. If this occurs, you can take Benadryl 25 mg and increase your fluid intake. If you experience trouble breathing, this can be serious. If it is severe call 911 IMMEDIATELY. If it is mild, please call  our office. If you take any of these medications: Glipizide/Metformin, Avandament, Glucavance, please do not take 48 hours after completing test unless otherwise instructed.  We will call to schedule your test 2-4 weeks out understanding that some insurance companies will need an authorization prior to the service being performed.   For non-scheduling related questions, please contact the cardiac imaging nurse navigator should you have any questions/concerns: Marchia Bond, Cardiac Imaging Nurse Navigator Gordy Clement, Cardiac Imaging Nurse Navigator [facility] Direct Office Dial: [phone]   For scheduling needs, including cancellations and rescheduling, please call [place], [phone].  Heart-Healthy Eating Plan Heart-healthy meal planning includes: Eating less unhealthy fats. Eating more healthy fats. Making other changes in your diet. What are tips for following this plan? Cooking Avoid frying your food. Try to bake, boil, grill, or broil it instead. You can also reduce fat by: Removing the skin from poultry. Removing all visible fats from meats. Steaming vegetables in water or broth. Meal planning  At meals, divide your plate into four equal parts: Fill one-half of your plate with vegetables and green salads. Fill one-fourth of your plate with whole grains. Fill one-fourth of your plate with lean protein foods. Eat 4-5 servings of vegetables per day. A serving of vegetables is: 1 cup of raw or cooked vegetables. 2 cups of raw leafy greens. Eat 4-5 servings of fruit per day. A serving of fruit is: 1 medium whole fruit.  cup of dried fruit.  cup of fresh, frozen, or canned fruit.  cup of 100% fruit juice. Eat more foods that have soluble fiber. These are apples, broccoli, carrots, beans, peas, and barley. Try to get 20-30 g of fiber per day. Eat 4-5 servings of nuts, legumes, and seeds per week: 1 serving of dried beans or legumes  equals  cup after being cooked. 1 serving of nuts is  cup. 1 serving of seeds equals 1 tablespoon. General information Eat more home-cooked food. Eat less restaurant, buffet, and fast food. Limit or avoid alcohol. Limit foods that are high in starch and sugar. Avoid fried foods. Lose weight if you are overweight. Keep track of how much salt (sodium) you eat. This is important if you have high blood pressure. Ask your doctor to tell you more about this. Try to add vegetarian meals each week. Fats Choose healthy fats. These include olive oil and canola oil, flaxseeds, walnuts, almonds, and seeds. Eat more omega-3 fats. These include salmon, mackerel, sardines, tuna, flaxseed oil, and ground flaxseeds. Try to eat fish at least 2 times each week. Check food labels. Avoid foods with trans fats or high amounts of saturated fat. Limit saturated fats. These are often found in animal products, such as meats, butter, and cream. These are also found in plant foods, such as palm oil, palm kernel oil, and coconut oil. Avoid foods with partially hydrogenated oils in them. These have trans fats. Examples are stick margarine, some tub margarines, cookies, crackers, and other baked goods. What foods can I eat? Fruits All fresh, canned (in natural  juice), or frozen fruits. Vegetables Fresh or frozen vegetables (raw, steamed, roasted, or grilled). Green salads. Grains Most grains. Choose whole wheat and whole grains most of the time. Rice and pasta, including brown rice and pastas made with whole wheat. Meats and other proteins Lean, well-trimmed beef, veal, pork, and lamb. Chicken and [place] without skin. All fish and shellfish. Wild duck, rabbit, pheasant, and venison. Egg whites or low-cholesterol egg substitutes. Dried beans, peas, lentils, and tofu. Seeds and most nuts. Dairy Low-fat or nonfat cheeses, including ricotta and mozzarella. Skim or 1% milk that is liquid, powdered, or evaporated.  Buttermilk that is made with low-fat milk. Nonfat or low-fat yogurt. Fats and oils Non-hydrogenated (trans-free) margarines. Vegetable oils, including soybean, sesame, sunflower, olive, peanut, safflower, corn, canola, and cottonseed. Salad dressings or mayonnaise made with a vegetable oil. Beverages Mineral water. Coffee and tea. Diet carbonated beverages. Sweets and desserts Sherbet, gelatin, and fruit ice. Small amounts of dark chocolate. Limit all sweets and desserts. Seasonings and condiments All seasonings and condiments. The items listed above may not be a complete list of foods and drinks you can eat. Contact a dietitian for more options. What foods should I avoid? Fruits Canned fruit in heavy syrup. Fruit in cream or butter sauce. Fried fruit. Limit coconut. Vegetables Vegetables cooked in cheese, cream, or butter sauce. Fried vegetables. Grains Breads that are made with saturated or trans fats, oils, or whole milk. Croissants. Sweet rolls. Donuts. High-fat crackers, such as cheese crackers. Meats and other proteins Fatty meats, such as hot dogs, ribs, sausage, bacon, rib-eye roast or steak. High-fat deli meats, such as salami and bologna. Caviar. Domestic duck and goose. Organ meats, such as liver. Dairy Cream, sour cream, cream cheese, and creamed cottage cheese. Whole-milk cheeses. Whole or 2% milk that is liquid, evaporated, or condensed. Whole buttermilk. Cream sauce or high-fat cheese sauce. Yogurt that is made from whole milk. Fats and oils Meat fat, or shortening. Cocoa butter, hydrogenated oils, palm oil, coconut oil, palm kernel oil. Solid fats and shortenings, including bacon fat, salt pork, lard, and butter. Nondairy cream substitutes. Salad dressings with cheese or sour cream. Beverages Regular sodas and juice drinks with added sugar. Sweets and desserts Frosting. Pudding. Cookies. Cakes. Pies. Milk chocolate or white chocolate. Buttered syrups. Full-fat ice cream or  ice cream drinks. The items listed above may not be a complete list of foods and drinks to avoid. Contact a dietitian for more information. Summary Heart-healthy meal planning includes eating less unhealthy fats, eating more healthy fats, and making other changes in your diet. Eat a balanced diet. This includes fruits and vegetables, low-fat or nonfat dairy, lean protein, nuts and legumes, whole grains, and heart-healthy oils and fats. This information is not intended to replace advice given to you by your health care provider. Make sure you discuss any questions you have with your health care provider. Document Revised: 07/02/2020 Document Reviewed: 07/02/2020 Elsevier Patient Education  2022 [employer].

## 2021-09-10 NOTE — Assessment & Plan Note (Signed)
History of hyperlipidemia not on statin therapy with lipid profile performed 06/18/2021 revealing a total cholesterol 207, LDL 115 HDL of 80.  Her LDL has increased from 84-1 15 over the last year probably as result of dietary indiscretion.

## 2021-09-11 ENCOUNTER — Encounter: Payer: Self-pay | Admitting: Hematology & Oncology

## 2021-09-11 LAB — BASIC METABOLIC PANEL
BUN/Creatinine Ratio: 13 (ref 12–28)
BUN: 9 mg/dL (ref 8–27)
CO2: 24 mmol/L (ref 20–29)
Calcium: 10.2 mg/dL (ref 8.7–10.3)
Chloride: 105 mmol/L (ref 96–106)
Creatinine, Ser: 0.72 mg/dL (ref 0.57–1.00)
Glucose: 114 mg/dL — ABNORMAL HIGH (ref 70–99)
Potassium: 4.2 mmol/L (ref 3.5–5.2)
Sodium: 143 mmol/L (ref 134–144)
eGFR: 94 mL/min/{1.73_m2} (ref >59)

## 2021-09-20 ENCOUNTER — Ambulatory Visit: Payer: Federal, State, Local not specified - PPO | Admitting: Family Medicine

## 2021-09-20 VITALS — BP 130/80 | HR 72 | Temp 97.5°F | Ht 61.0 in | Wt 183.2 lb

## 2021-09-20 DIAGNOSIS — M3119 Other thrombotic microangiopathy: Secondary | ICD-10-CM | POA: Diagnosis not present

## 2021-09-20 DIAGNOSIS — R5383 Other fatigue: Secondary | ICD-10-CM | POA: Diagnosis not present

## 2021-09-20 DIAGNOSIS — E119 Type 2 diabetes mellitus without complications: Secondary | ICD-10-CM | POA: Diagnosis not present

## 2021-09-20 LAB — POCT GLYCOSYLATED HEMOGLOBIN (HGB A1C): Hemoglobin A1C: 7 % — AB (ref 4.0–5.6)

## 2021-09-20 MED ORDER — TRULICITY 1.5 MG/0.5ML ~~LOC~~ SOAJ: 1.5000 mg | SUBCUTANEOUS | 0 refills | Status: DC

## 2021-09-20 NOTE — Assessment & Plan Note (Signed)
Lab Results  Component Value Date   HGBA1C 7.0 (A) 09/20/2021  Controlled, however, patient just got a refill of the 0.75 mg which she had been doing double dose of Trulicity.  Discussed that she could just take 0.75 mg and monitor blood glucose at home plan to increase back to the 1.5 mg if blood glucose is elevated.  Also discussed that she is not following a diabetic diet, and she will try to make some lifestyle changes to do so.

## 2021-09-20 NOTE — Progress Notes (Signed)
Subjective:     Luverna Denielle Bayard is a 61 y.o. female presenting for Follow-up (27moDM ) and Fatigue     HPI  #Diabetes Currently taking trulicity 1.5 mg  Using medications without difficulties: Yes Hypoglycemic episodes:No  Hyperglycemic episodes:No  Feet problems:No  Blood Sugars averaging: 119-120s Last HgbA1c:  Lab Results  Component Value Date   HGBA1C 7.0 (A) 09/20/2021    Diabetes Health Maintenance Due:    Diabetes Health Maintenance Due  Topic Date Due   FOOT EXAM  08/14/2020   OPHTHALMOLOGY EXAM  01/13/2021   URINE MICROALBUMIN  08/06/2021   HEMOGLOBIN A1C  03/23/2022   #ttp - was taken off lasix and potassium - stopped this July 1 - fatigue was going on before this - has noticed some foot swelling but this was present before stopping  Is planning to get an angiogram to evaluate some heart pain  #Fatigue - low energy - for 2 months - falling asleep after eating - waking up feeling tired - sleep is poor - wakes up - not sure if she snores  - not sure if stops breathing - no blood loss - no changes in hair, skin, nails - no new medications - has gained ~10 lbs since last year - drinking V8 juice, beet juice - eating - does not 3 meals, mostly fried foods  Review of Systems   Social History   Tobacco Use  Smoking Status Never  Smokeless Tobacco Never        Objective:    BP Readings from Last 3 Encounters:  09/20/21 130/80  09/10/21 126/78  08/05/21 128/78   Wt Readings from Last 3 Encounters:  09/20/21 183 lb 4 oz (83.1 kg)  09/10/21 181 lb (82.1 kg)  08/05/21 184 lb 2 oz (83.5 kg)    BP 130/80   Pulse 72   Temp (!) 97.5 F (36.4 C) (Temporal)   Ht 5' 1"  (1.549 m)   Wt 183 lb 4 oz (83.1 kg)   LMP 04/30/2012   SpO2 99%   BMI 34.62 kg/m    Physical Exam Constitutional:      General: She is not in acute distress.    Appearance: She is well-developed. She is not diaphoretic.  HENT:     Right Ear: External ear  normal.     Left Ear: External ear normal.     Nose: Nose normal.  Eyes:     General: No scleral icterus.    Conjunctiva/sclera: Conjunctivae normal.  Neck:     Thyroid: No thyroid mass, thyromegaly or thyroid tenderness.  Cardiovascular:     Rate and Rhythm: Normal rate and regular rhythm.     Heart sounds: No murmur heard. Pulmonary:     Effort: Pulmonary effort is normal. No respiratory distress.     Breath sounds: Normal breath sounds. No wheezing.  Musculoskeletal:     Cervical back: Neck supple.  Skin:    General: Skin is warm and dry.     Capillary Refill: Capillary refill takes less than 2 seconds.  Neurological:     Mental Status: She is alert. Mental status is at baseline.  Psychiatric:        Mood and Affect: Mood normal.        Behavior: Behavior normal.           Assessment & Plan:   Problem List Items Addressed This Visit       Cardiovascular and Mediastinum   TTP (thrombotic thrombocytopenic  purpura) (HCC) (Chronic)   Relevant Orders   CBC     Endocrine   Controlled type 2 diabetes mellitus without complication ([place]) - Primary (Chronic)    Lab Results  Component Value Date   HGBA1C 7.0 (A) 09/20/2021  Controlled, however, patient just got a refill of the 0.75 mg which she had been doing double dose of Trulicity.  Discussed that she could just take 0.75 mg and monitor blood glucose at home plan to increase back to the 1.5 mg if blood glucose is elevated.  Also discussed that she is not following a diabetic diet, and she will try to make some lifestyle changes to do so.       Relevant Medications   Dulaglutide (TRULICITY) 1.5 MW/1.0UV SOPN   Other Relevant Orders   POCT glycosylated hemoglobin (Hb A1C) (Completed)     Other   Other fatigue    Patient notes 2 months of fatigue.  No other new symptoms.  She is eating mostly fried food and not exercising.  Notes she stopped exercising a couple of months ago discussed getting back into regular  exercise and trying to eat a diabetic healthy diet.  Her vitamin D and B12 were normal on last check and she continues her supplements.  Her last blood counts were normal, but we will repeat these.  We will recheck thyroid as it has been several years for this.  She notes some sleepiness but Epworth was only 5, if blood work is normal and no response to healthy diet and exercise will consider pulmonology referral for sleep apnea evaluation.      Relevant Orders   TSH     Return in about 3 months (around 12/21/2021) for diabetes.  Lesleigh Noe, MD

## 2021-09-20 NOTE — Assessment & Plan Note (Signed)
Patient notes 2 months of fatigue.  No other new symptoms.  She is eating mostly fried food and not exercising.  Notes she stopped exercising a couple of months ago discussed getting back into regular exercise and trying to eat a diabetic healthy diet.  Her vitamin D and B12 were normal on last check and she continues her supplements.  Her last blood counts were normal, but we will repeat these.  We will recheck thyroid as it has been several years for this.  She notes some sleepiness but Epworth was only 5, if blood work is normal and no response to healthy diet and exercise will consider pulmonology referral for sleep apnea evaluation.

## 2021-09-20 NOTE — Patient Instructions (Addendum)
Labs today Work on [employer] and getting back into exercise  If no improvement in 2 weeks let me know and we can refer for sleep apnea  Ok to decrease to 0.75 mg Let me know if I need to send the 1.5 mg to a different pharmacy  If sugars increasing - go back to 1.5 mg dose

## 2021-09-21 ENCOUNTER — Telehealth: Payer: Self-pay

## 2021-09-21 LAB — CBC
HCT: 39.1 % (ref 36.0–46.0)
Hemoglobin: 13.2 g/dL (ref 12.0–15.0)
MCHC: 33.7 g/dL (ref 30.0–36.0)
MCV: 90.2 fl (ref 78.0–100.0)
Platelets: 128 10*3/uL — ABNORMAL LOW (ref 150.0–400.0)
RBC: 4.34 Mil/uL (ref 3.87–5.11)
RDW: 13.7 % (ref 11.5–15.5)
WBC: 4.2 10*3/uL (ref 4.0–10.5)

## 2021-09-21 LAB — TSH: TSH: 1.33 u[IU]/mL (ref 0.35–5.50)

## 2021-09-21 NOTE — Telephone Encounter (Signed)
As of today patient still hasn't went to have her labs drawn to start Humira.

## 2021-10-06 ENCOUNTER — Other Ambulatory Visit (HOSPITAL_COMMUNITY): Payer: Federal, State, Local not specified - PPO

## 2021-10-18 ENCOUNTER — Encounter (HOSPITAL_COMMUNITY): Payer: Self-pay

## 2021-10-18 ENCOUNTER — Telehealth (HOSPITAL_COMMUNITY): Payer: Self-pay | Admitting: *Deleted

## 2021-10-18 NOTE — Telephone Encounter (Signed)
Attempted to call patient regarding upcoming cardiac CT appointment. °Left message on voicemail with name and callback number ° °Barnabas Henriques RN Navigator Cardiac Imaging °[facility] °[phone] Office °[phone] Cell ° °

## 2021-10-19 ENCOUNTER — Telehealth (HOSPITAL_COMMUNITY): Payer: Self-pay | Admitting: *Deleted

## 2021-10-19 NOTE — Telephone Encounter (Signed)
Patient returning call regarding upcoming cardiac imaging study; pt verbalizes understanding of appt date/time, parking situation and where to check in, pre-test NPO status and medications ordered, and verified current allergies; name and call back number provided for further questions should they arise  Gordy Clement RN Navigator Cardiac [place] [facility] [phone] office [phone] cell  Patient to take 118m metoprolol tartrate two hours prior to her cardiac CT scan. She is aware to arrive at 11am.

## 2021-10-20 ENCOUNTER — Ambulatory Visit (HOSPITAL_COMMUNITY)
Admission: RE | Admit: 2021-10-20 | Discharge: 2021-10-20 | Disposition: A | Discharge status: Home / Self Care | Payer: Federal, State, Local not specified - PPO | Source: Ambulatory Visit | Attending: Cardiovascular Disease | Admitting: Cardiovascular Disease

## 2021-10-20 DIAGNOSIS — R072 Precordial pain: Secondary | ICD-10-CM | POA: Diagnosis not present

## 2021-10-20 MED ORDER — NITROGLYCERIN 0.4 MG SL SUBL
0.8000 mg | SUBLINGUAL_TABLET | Freq: Once | SUBLINGUAL | Status: AC
  Administered 2021-10-20: 0.8 mg via SUBLINGUAL

## 2021-10-20 MED ORDER — IOHEXOL 350 MG/ML SOLN
100.0000 mL | Freq: Once | INTRAVENOUS | Status: AC | PRN
  Administered 2021-10-20: 100 mL via INTRAVENOUS

## 2021-10-20 MED ORDER — NITROGLYCERIN 0.4 MG SL SUBL
SUBLINGUAL_TABLET | SUBLINGUAL | Status: AC
  Filled 2021-10-20: qty 2

## 2021-10-27 ENCOUNTER — Inpatient Hospital Stay: Payer: Federal, State, Local not specified - PPO | Admitting: Hematology & Oncology

## 2021-10-27 ENCOUNTER — Encounter: Payer: Self-pay | Admitting: Hematology & Oncology

## 2021-10-27 ENCOUNTER — Telehealth: Payer: Self-pay | Admitting: Family Medicine

## 2021-10-27 ENCOUNTER — Inpatient Hospital Stay: Payer: Federal, State, Local not specified - PPO | Attending: Nurse Practitioner

## 2021-10-27 VITALS — BP 159/84 | HR 72 | Temp 98.0°F | Resp 18 | Wt 188.0 lb

## 2021-10-27 DIAGNOSIS — M3119 Other thrombotic microangiopathy: Secondary | ICD-10-CM

## 2021-10-27 DIAGNOSIS — D72819 Decreased white blood cell count, unspecified: Secondary | ICD-10-CM | POA: Diagnosis not present

## 2021-10-27 DIAGNOSIS — Z7985 Long-term (current) use of injectable non-insulin antidiabetic drugs: Secondary | ICD-10-CM | POA: Insufficient documentation

## 2021-10-27 DIAGNOSIS — K7581 Nonalcoholic steatohepatitis (NASH): Secondary | ICD-10-CM | POA: Insufficient documentation

## 2021-10-27 DIAGNOSIS — Z7951 Long term (current) use of inhaled steroids: Secondary | ICD-10-CM | POA: Insufficient documentation

## 2021-10-27 DIAGNOSIS — Z79899 Other long term (current) drug therapy: Secondary | ICD-10-CM | POA: Diagnosis not present

## 2021-10-27 DIAGNOSIS — E119 Type 2 diabetes mellitus without complications: Secondary | ICD-10-CM

## 2021-10-27 LAB — CMP (CANCER CENTER ONLY)
ALT: 21 U/L (ref 0–44)
AST: 28 U/L (ref 15–41)
Albumin: 4.4 g/dL (ref 3.5–5.0)
Alkaline Phosphatase: 116 U/L (ref 38–126)
Anion gap: 6 (ref 5–15)
BUN: 9 mg/dL (ref 8–23)
CO2: 30 mmol/L (ref 22–32)
Calcium: 11 mg/dL — ABNORMAL HIGH (ref 8.9–10.3)
Chloride: 104 mmol/L (ref 98–111)
Creatinine: 0.92 mg/dL (ref 0.44–1.00)
GFR, Estimated: >60 mL/min (ref >60)
Glucose, Bld: 97 mg/dL (ref 70–99)
Potassium: 4 mmol/L (ref 3.5–5.1)
Sodium: 140 mmol/L (ref 135–145)
Total Bilirubin: 0.7 mg/dL (ref 0.3–1.2)
Total Protein: 7.7 g/dL (ref 6.5–8.1)

## 2021-10-27 LAB — CBC WITH DIFFERENTIAL (CANCER CENTER ONLY)
Abs Immature Granulocytes: 0.01 10*3/uL (ref 0.00–0.07)
Basophils Absolute: 0 10*3/uL (ref 0.0–0.1)
Basophils Relative: 1 %
Eosinophils Absolute: 0.2 10*3/uL (ref 0.0–0.5)
Eosinophils Relative: 4 %
HCT: 39.5 % (ref 36.0–46.0)
Hemoglobin: 12.9 g/dL (ref 12.0–15.0)
Immature Granulocytes: 0 %
Lymphocytes Relative: 37 %
Lymphs Abs: 1.6 10*3/uL (ref 0.7–4.0)
MCH: 30 pg (ref 26.0–34.0)
MCHC: 32.7 g/dL (ref 30.0–36.0)
MCV: 91.9 fL (ref 80.0–100.0)
Monocytes Absolute: 0.3 10*3/uL (ref 0.1–1.0)
Monocytes Relative: 8 %
Neutro Abs: 2.1 10*3/uL (ref 1.7–7.7)
Neutrophils Relative %: 50 %
Platelet Count: 141 10*3/uL — ABNORMAL LOW (ref 150–400)
RBC: 4.3 MIL/uL (ref 3.87–5.11)
RDW: 13.2 % (ref 11.5–15.5)
WBC Count: 4.2 10*3/uL (ref 4.0–10.5)
nRBC: 0 % (ref 0.0–0.2)

## 2021-10-27 LAB — RETICULOCYTES
Immature Retic Fract: 10.7 % (ref 2.3–15.9)
RBC.: 4.22 MIL/uL (ref 3.87–5.11)
Retic Count, Absolute: 69.2 10*3/uL (ref 19.0–186.0)
Retic Ct Pct: 1.6 % (ref 0.4–3.1)

## 2021-10-27 LAB — LACTATE DEHYDROGENASE: LDH: 130 U/L (ref 98–192)

## 2021-10-27 LAB — SAVE SMEAR(SSMR), FOR PROVIDER SLIDE REVIEW

## 2021-10-27 NOTE — Progress Notes (Signed)
Hematology and Oncology Follow Up Visit  Vicki [name] [id number] 04-23-1960 61 y.o. 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  We last saw her back in February.  At that time, she had a normal  ADAMTS-13 level of greater than 100%.  She feels okay.  She is trying to look for a new job.  She wants a part-time job.  She is worried about the weight gain.  Her weight is up 3 pounds since we last saw her.  Hopefully, with her working, she will lose a little more weight.  She has had no bleeding.  There has been no change in bowel or bladder habits.  She has had no nausea or vomiting.  There is no cough or shortness of breath.  She has had no leg swelling.  Her last mammogram was done back in September 2022.  Her performance status right now is ECOG 0.     Medications:  Current Outpatient Medications:    acetaminophen (TYLENOL) 325 MG tablet, Take 650 mg by mouth every 6 (six) hours as needed., Disp: , Rfl:    Alum Hydroxide-Mag Carbonate (GAVISCON EXTRA STRENGTH PO), Take 1 tablet by mouth 4 (four) times daily as needed., Disp: , Rfl:    Ascorbic Acid (VITAMIN C) 500 MG CAPS, Take by mouth daily., Disp: , Rfl:    cholecalciferol (VITAMIN D3) 25 MCG (1000 UT) tablet, Take 1,000 Units by mouth daily., Disp: , Rfl:    Dulaglutide (TRULICITY) 1.5 WU/9.8JX SOPN, Inject 1.5 mg into the skin once a week., Disp: 6 mL, Rfl: 0   fexofenadine (ALLEGRA) 180 MG tablet, Take 180 mg by mouth daily as needed for allergies or rhinitis., Disp: , Rfl:    folic acid (FOLVITE) 1 MG tablet, Take two tablets by mouth once daily, Disp: 60 tablet, Rfl: 3   glucose blood test strip, Use as instructed by checking blood sugar 1 - 2 times daily Dx E11.9, Disp:  100 each, Rfl: 1   MAGNESIUM GLYCINATE PO, Take 200 mg by mouth daily., Disp: , Rfl:    metoprolol tartrate (LOPRESSOR) 100 MG tablet, Take 1 tablet (100 mg total) by mouth once for 1 dose. 2 hours prior to your procedure., Disp: 1 tablet, Rfl: 0   mometasone (ELOCON) 0.1 % cream, APPLY  CREAM TOPICALLY ONCE DAILY, Disp: 45 g, Rfl: 0   Multiple Vitamin (MULTIVITAMIN PO), Take by mouth., Disp: , Rfl:    vitamin B-12 (CYANOCOBALAMIN) 500 MCG tablet, Take 500 mcg by mouth daily., Disp: , Rfl:   Allergies:  Allergies  Allergen Reactions   Other Other (See Comments)   Metformin And Related Itching    Heart palpitation   Sulfa Antibiotics Itching    Past Medical History, Surgical history, Social history, and Family History were reviewed and updated.  Review of Systems: Review of Systems  Constitutional: Negative.   HENT:  Negative.    Eyes: Negative.   Respiratory: Negative.    Cardiovascular:  Positive for leg swelling.  Gastrointestinal: Negative.   Endocrine: Negative.   Genitourinary: Negative.    Musculoskeletal:  Positive for arthralgias and myalgias.  Skin: Negative.   Neurological: Negative.   Hematological: Negative.   Psychiatric/Behavioral: Negative.  Physical Exam:  weight is 188 lb (85.3 kg). Her oral temperature is 98 F (36.7 C). Her blood pressure is 159/84 (abnormal) and her pulse is 72. Her respiration is 18 and oxygen saturation is 100%.   Wt Readings from Last 3 Encounters:  10/27/21 188 lb (85.3 kg)  09/20/21 183 lb 4 oz (83.1 kg)  09/10/21 181 lb (82.1 kg)    Physical Exam Vitals reviewed.  HENT:     Head: Normocephalic and atraumatic.  Eyes:     Pupils: Pupils are equal, round, and reactive to light.  Cardiovascular:     Rate and Rhythm: Normal rate and regular rhythm.     Heart sounds: Normal heart sounds.  Pulmonary:     Effort: Pulmonary effort is normal.     Breath sounds: Normal breath sounds.  Abdominal:     General: Bowel sounds  are normal.     Palpations: Abdomen is soft.  Musculoskeletal:        General: No tenderness or deformity. Normal range of motion.     Cervical back: Normal range of motion.     Comments: Extremities shows some 1+ edema in her lower legs.  She has good range of motion of her joints.  She has good strength in upper and lower extremities.  Lymphadenopathy:     Cervical: No cervical adenopathy.  Skin:    General: Skin is warm and dry.     Findings: No erythema or rash.  Neurological:     Mental Status: She is alert and oriented to person, place, and time.  Psychiatric:        Behavior: Behavior normal.        Thought Content: Thought content normal.        Judgment: Judgment normal.      Lab Results  Component Value Date   WBC 4.2 10/27/2021   HGB 12.9 10/27/2021   HCT 39.5 10/27/2021   MCV 91.9 10/27/2021   PLT 141 (L) 10/27/2021     Chemistry      Component Value Date/Time   NA 140 10/27/2021 1513   NA 143 09/10/2021 1601   K 4.0 10/27/2021 1513   CL 104 10/27/2021 1513   CO2 30 10/27/2021 1513   BUN 9 10/27/2021 1513   BUN 9 09/10/2021 1601   CREATININE 0.92 10/27/2021 1513   CREATININE 0.70 09/25/2015 1504      Component Value Date/Time   CALCIUM 11.0 (H) 10/27/2021 1513   ALKPHOS 116 10/27/2021 1513   AST 28 10/27/2021 1513   ALT 21 10/27/2021 1513   BILITOT 0.7 10/27/2021 1513      Impression and Plan: Vicki [name] is a 61 year old African-American female.  She has acquired TTP.  She had relapsed.  She responded well to treatment with plasma exchange and Rituxan.  She is now has been off treatment now for over 2 years.  I looked at her blood smear under the microscope.  I do not see anything that look like active TTP.  I really do not see any schistocytes.  Her platelets looked normal with well granulated platelets.  There is no nucleated red blood cells.  White blood cells appeared mature.  We will still follow her along every 6 months.  I am very happy that  her blood sugars are doing much better.  I still think this is going to be low we will determine her long-term prognosis.   Volanda Napoleon, MD 8/23/20233:58 PM

## 2021-10-28 DIAGNOSIS — E119 Type 2 diabetes mellitus without complications: Secondary | ICD-10-CM

## 2021-10-28 NOTE — Telephone Encounter (Signed)
Called pt back and got her VM. Left VM telling her I would send her a mychart message. Message sent relaying Dr. Verda Cumins message.

## 2021-10-28 NOTE — Telephone Encounter (Signed)
Please call pt  1) I'm not sure why the Trulicity was denied. At our last visit we discussed decreasing to 0.75 mg. Please verify the dose she would like and send in refill.   2) Elevated Calcium I would recommend avoiding the following:  A high-calcium diet (>1000 mg/day) ?Calcium supplements ?Vitamin D supplements in excess of 800 international units/day ?Multivitamins containing calcium  I do not think she needs Vitamin K2

## 2021-10-28 NOTE — Telephone Encounter (Signed)
Patient called and said pharmacy said Dr Einar Pheasant denied refill on Trulicity and she wasn't sure why. Patient also wanted to know if she can take Vitamin K2 because her labs showed that her calcium level was elevated when she had labs drawn by her hematologist yesterday. Call back is [phone]

## 2021-10-29 MED ORDER — TRULICITY 0.75 MG/0.5ML ~~LOC~~ SOAJ: 0.7500 mg | SUBCUTANEOUS | 0 refills | Status: DC

## 2021-10-29 NOTE — Telephone Encounter (Signed)
Pt replied to [employer].

## 2021-10-30 LAB — ADAMTS13 ACTIVITY: Adamts 13 Activity: >100 % (ref >66.8)

## 2021-10-30 LAB — ADAMTS13 ACTIVITY REFLEX

## 2021-11-01 ENCOUNTER — Other Ambulatory Visit: Payer: Self-pay | Admitting: Hematology & Oncology

## 2021-11-01 DIAGNOSIS — M3119 Other thrombotic microangiopathy: Secondary | ICD-10-CM

## 2021-11-03 ENCOUNTER — Ambulatory Visit: Payer: Federal, State, Local not specified - PPO | Admitting: Podiatry

## 2021-11-19 DIAGNOSIS — K7460 Unspecified cirrhosis of liver: Secondary | ICD-10-CM | POA: Diagnosis not present

## 2021-11-19 DIAGNOSIS — K7581 Nonalcoholic steatohepatitis (NASH): Secondary | ICD-10-CM | POA: Diagnosis not present

## 2021-11-24 ENCOUNTER — Other Ambulatory Visit: Payer: Self-pay | Admitting: Hematology & Oncology

## 2021-11-24 DIAGNOSIS — M3119 Other thrombotic microangiopathy: Secondary | ICD-10-CM

## 2021-11-26 ENCOUNTER — Encounter: Payer: Self-pay | Admitting: Podiatry

## 2021-11-26 ENCOUNTER — Ambulatory Visit (INDEPENDENT_AMBULATORY_CARE_PROVIDER_SITE_OTHER): Payer: Federal, State, Local not specified - PPO | Admitting: Podiatry

## 2021-11-26 DIAGNOSIS — E119 Type 2 diabetes mellitus without complications: Secondary | ICD-10-CM | POA: Diagnosis not present

## 2021-11-26 DIAGNOSIS — M79675 Pain in left toe(s): Secondary | ICD-10-CM | POA: Diagnosis not present

## 2021-11-26 DIAGNOSIS — M79674 Pain in right toe(s): Secondary | ICD-10-CM | POA: Diagnosis not present

## 2021-11-26 DIAGNOSIS — B359 Dermatophytosis, unspecified: Secondary | ICD-10-CM

## 2021-11-26 DIAGNOSIS — B351 Tinea unguium: Secondary | ICD-10-CM | POA: Diagnosis not present

## 2021-11-26 NOTE — Patient Instructions (Addendum)
Spray Lotrimin AF Powder between affected toes once daily. Call office if condition worsens. Oral antifungals contraindicated due to liver issues.  Athlete's Foot Athlete's foot (tinea pedis) is a fungal infection of the skin on your feet. It often occurs on the skin that is between or underneath the toes. It can also occur on the soles of your feet. The infection can spread from person to person (is contagious). It can also spread when a person's bare feet come in contact with the fungus on shower floors or on items such as shoes. What are the causes? This condition is caused by a fungus that grows in warm, moist places. You can get athlete's foot by sharing shoes, shower stalls, towels, and wet floors with someone who is infected. Not washing your feet or changing your socks often enough can also lead to athlete's foot. What increases the risk? This condition is more likely to develop in: Men. People who have a weak body defense system (immune system). People who have diabetes. People who use public showers, such as at a gym. People who wear heavy-duty shoes, such as Environmental manager. Seasons with warm, humid weather. What are the signs or symptoms? Symptoms of this condition include: Itchy areas between your toes or on the soles of your feet. White, flaky, or scaly areas between your toes or on the soles of your feet. Very itchy small blisters between your toes or on the soles of your feet. Small cuts in your skin. These cuts can become infected. Thick or discolored toenails. How is this diagnosed? This condition may be diagnosed with a physical exam and a review of your medical history. Your health care provider may also take a skin or toenail sample to examine under a microscope. How is this treated? This condition is treated with antifungal medicines. These may be applied as powders, ointments, or creams. In severe cases, an oral antifungal medicine may be given. Follow  these instructions at home: Medicines Apply or take over-the-counter and prescription medicines only as told by your health care provider. Apply your antifungal medicine as told by your health care provider. Do not stop using the antifungal even if your condition improves. Foot care Do not scratch your feet. Keep your feet dry: Wear cotton or wool socks. Change your socks every day or if they become wet. Wear shoes that allow air to flow, such as sandals or canvas tennis shoes. Wash and dry your feet, including the area between your toes. Also, wash and dry your feet: Every day or as told by your health care provider. After exercising. General instructions Do not let others use towels, shoes, nail clippers, or other personal items that touch your feet. Protect your feet by wearing sandals in wet areas, such as locker rooms and shared showers. Keep all follow-up visits. This is important. If you have diabetes, keep your blood sugar under control. Contact a health care provider if: You have a fever. You have swelling, soreness, warmth, or redness in your foot. Your feet are not getting better with treatment. Your symptoms get worse. You have new symptoms. You have severe pain. Summary Athlete's foot (tinea pedis) is a fungal infection of the skin on your feet. It often occurs on skin that is between or underneath the toes. This condition is caused by a fungus that grows in warm, moist places. Symptoms include white, flaky, or scaly areas between your toes or on the soles of your feet. This condition is treated with  antifungal medicines. Keep your feet clean. Always dry them thoroughly. This information is not intended to replace advice given to you by your health care provider. Make sure you discuss any questions you have with your health care provider. Document Revised: 06/14/2020 Document Reviewed: 06/14/2020 Elsevier Patient Education  [place].

## 2021-12-01 DIAGNOSIS — J3489 Other specified disorders of nose and nasal sinuses: Secondary | ICD-10-CM | POA: Diagnosis not present

## 2021-12-03 NOTE — Progress Notes (Signed)
  Subjective:  Patient ID: Vicki [name], female    DOB: [birth year],  MRN: [id number]  [name] presents to clinic today for preventative diabetic foot care and painful elongated mycotic toenails 1-5 bilaterally which are tender when wearing enclosed shoe gear. Pain is relieved with periodic professional debridement.  Patient states blood glucose was 120 mg/dl today.  Last known  HgA1c was 7.0%.    She is a former patient of Dr. Leeanne [name]. Patient states she has been applying clotrimazole solution between toes for moisture control  PCP is Vicki Noe, MD , and last visit was  September 20, 2021.  Allergies  Allergen Reactions   Other Other (See Comments)   Metformin And Related Itching    Heart palpitation   Sulfa Antibiotics Itching    Review of Systems: Negative except as noted in the HPI.  Objective: No changes noted in today's physical examination.  Vicki [name] is a pleasant 61 y.o. female WD, WN in NAD. AAO x 3.  Vascular Examination: Capillary refill time immediate b/l.Vascular status intact b/l with palpable pedal pulses. Pedal hair present b/l. No edema. No pain with calf compression b/l. Skin temperature gradient WNL b/l. No cyanosis or clubbing noted b/l LE.  Neurological Examination: Sensation grossly intact b/l with 10 gram monofilament. Vibratory sensation intact b/l.   Dermatological Examination: Pedal skin with normal turgor, texture and tone b/l. Toenails b/l great toes and left 5th digit thick, discolored, elongated with subungual debris and pain on dorsal palpation. No open wounds b/l LE. Interdigital maceration noted left 4th  webspace(s). No blistering, no weeping, no open wounds.  Musculoskeletal Examination: Muscle strength 5/5 to all lower extremity muscle groups bilaterally. Hammertoe deformity noted 2-5 b/l. Pes planus deformity noted bilateral LE.  Radiographs: None  Last A1c:      Latest Ref Rng & Units 09/20/2021    3:57 PM  06/18/2021   12:36 PM 03/19/2021    9:07 AM  Hemoglobin A1C  Hemoglobin-A1c 4.0 - 5.6 % 7.0  7.0  6.8     Assessment/Plan: 1. Pain due to onychomycosis of toenails of both feet   2. Tinea   3. Controlled type 2 diabetes mellitus without complication, without long-term current use of insulin ([place])     No orders of the defined types were placed in this encounter.  -Patient was evaluated and treated. All patient's and/or POA's questions/concerns answered on today's visit. -Patient was advised to discontinue Clotrimazole Solution between toes. Start Lotrimin antifungal spray powder to counteract interdigital maceration. -Toenails bilateral great toes and L 5th toe debrided in length and girth without iatrogenic bleeding with sterile nail nipper and dremel.  -Patient/POA to call should there be question/concern in the interim.   Return in about 3 months (around 02/25/2022).  Vicki [name], DPM

## 2021-12-09 ENCOUNTER — Other Ambulatory Visit: Payer: Self-pay

## 2021-12-09 DIAGNOSIS — I10 Essential (primary) hypertension: Secondary | ICD-10-CM | POA: Diagnosis not present

## 2021-12-09 DIAGNOSIS — R011 Cardiac murmur, unspecified: Secondary | ICD-10-CM | POA: Diagnosis not present

## 2021-12-09 DIAGNOSIS — R072 Precordial pain: Secondary | ICD-10-CM | POA: Diagnosis not present

## 2021-12-09 MED ORDER — ONETOUCH ULTRASOFT LANCETS MISC: 1.0000 | Freq: Every day | 1 refills | Status: DC

## 2021-12-10 LAB — HEPATIC FUNCTION PANEL
ALT: 18 IU/L (ref 0–32)
AST: 26 IU/L (ref 0–40)
Albumin: 4.5 g/dL (ref 3.9–4.9)
Alkaline Phosphatase: 135 IU/L — ABNORMAL HIGH (ref 44–121)
Bilirubin Total: 0.6 mg/dL (ref 0.0–1.2)
Bilirubin, Direct: 0.21 mg/dL (ref 0.00–0.40)
Total Protein: 7.2 g/dL (ref 6.0–8.5)

## 2021-12-10 LAB — LIPID PANEL
Chol/HDL Ratio: 2.4 ratio (ref 0.0–4.4)
Cholesterol, Total: 219 mg/dL — ABNORMAL HIGH (ref 100–199)
HDL: 90 mg/dL (ref >39)
LDL Chol Calc (NIH): 121 mg/dL — ABNORMAL HIGH (ref 0–99)
Triglycerides: 48 mg/dL (ref 0–149)
VLDL Cholesterol Cal: 8 mg/dL (ref 5–40)

## 2021-12-13 ENCOUNTER — Ambulatory Visit: Payer: Federal, State, Local not specified - PPO | Attending: Physician Assistant | Admitting: Physician Assistant

## 2021-12-13 VITALS — BP 136/84 | HR 78 | Ht 62.0 in | Wt 185.0 lb

## 2021-12-13 DIAGNOSIS — I10 Essential (primary) hypertension: Secondary | ICD-10-CM | POA: Diagnosis not present

## 2021-12-13 DIAGNOSIS — E119 Type 2 diabetes mellitus without complications: Secondary | ICD-10-CM

## 2021-12-13 DIAGNOSIS — R0789 Other chest pain: Secondary | ICD-10-CM

## 2021-12-13 DIAGNOSIS — E785 Hyperlipidemia, unspecified: Secondary | ICD-10-CM

## 2021-12-13 DIAGNOSIS — M3119 Other thrombotic microangiopathy: Secondary | ICD-10-CM

## 2021-12-13 NOTE — Patient Instructions (Signed)
Medication Instructions:  Your physician recommends that you continue on your current medications as directed. Please refer to the Current Medication list given to you today.  *If you need a refill on your cardiac medications before your next appointment, please call your pharmacy*  Lab Work: Your physician recommends that you return for lab work in 6 months:  Fasting Lipid Panel-DO NOT eat or drink past midnight. Okay to have water and/or coffee to drink.  If you have labs (blood work) drawn today and your tests are completely normal, you will receive your results only by: [place] (if you have MyChart) OR A paper copy in the mail If you have any lab test that is abnormal or we need to change your treatment, we will call you to review the results.  Testing/Procedures: NONE ordered at this time of appointment   Follow-Up: At [facility], you and your health needs are our priority.  As part of our continuing mission to provide you with exceptional heart care, we have created designated Provider Care Teams.  These Care Teams include your primary Cardiologist (physician) and Advanced Practice Providers (APPs -  Physician Assistants and Nurse Practitioners) who all work together to provide you with the care you need, when you need it.   Your next appointment:   1 year(s)  The format for your next appointment:   In Person  Provider:   Quay Burow, MD     Other Instructions  Important Information About Sugar

## 2021-12-13 NOTE — Progress Notes (Unsigned)
Cardiology Office Note:    Date:  12/13/2021   ID:  Vicki [name], DOB 1960-06-05, MRN [id number]  PCP:  Lesleigh Noe, [place] Providers Cardiologist:  Quay Burow, MD { Click to update primary MD,subspecialty MD or APP then REFRESH:1}    Referring MD: Lesleigh Noe, MD   No chief complaint on file. ***  History of Present Illness:    Vicki [name] is a 61 y.o. female with a hx of hypertension, hyperlipidemia, DM 2 and the palpitation.  Patient was initially referred to cardiology service for evaluation of palpitation.  Her mother had MI at age 43.  She herself has never had a heart attack or stroke.  2D echo was normal.  Echocardiogram showed sinus rhythm with occasional sinus tachycardia and frequent PVCs.  She decreased her caffeine intake which improved her symptoms.  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  And diascopy performed in June 2023 showed hiatal hernia with gastritis.  Calcium score performed in 2022 was 0.  Patient was last seen by Dr. Alvester Chou in July 2023 at which time she complained of some atypical chest pain.  Subsequent coronary CT revealed coronary calcium score of 0, mild nonobstructive CAD.  Since last visit, patient's chest pain has completely resolved.  I did not start her on aspirin given TTP.  Recent blood work showed elevated total cholesterol and LDL, however her HDL was really high at 90.  If she is trying to increase activity level as well.  I recommended diet and exercise and recheck fasting lipid panel in 6 months.  Otherwise, as long she is doing well, she can follow-up in 1 year.   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    Past Surgical History:  Procedure Laterality Date   BREAST CYST ASPIRATION Left    COLONOSCOPY  2014   ECTOPIC PREGNANCY SURGERY  1990   IR FLUORO GUIDE CV LINE RIGHT  03/05/2019   IR FLUORO GUIDE CV LINE RIGHT  03/15/2019   IR FLUORO GUIDE CV LINE RIGHT  05/17/2019   IR FLUORO GUIDE CV LINE RIGHT  05/23/2019   IR REMOVAL TUN CV CATH W/O FL  04/12/2019   IR REMOVAL TUN CV CATH W/O FL  08/06/2019   IR US GUIDE VASC ACCESS RIGHT  03/05/2019   IR US GUIDE VASC ACCESS RIGHT  03/15/2019   IR US GUIDE VASC ACCESS RIGHT  05/17/2019   TONSILLECTOMY  1980   UPPER GI ENDOSCOPY  08/2017   [facility]    Current Medications: Current Meds  Medication Sig   acetaminophen (TYLENOL) 325 MG tablet Take 650 mg by mouth every 6 (six) hours as needed.   Alum Hydroxide-Mag Carbonate (GAVISCON EXTRA STRENGTH PO) Take 1 tablet by mouth 4 (four) times daily as needed.   Ascorbic Acid (VITAMIN C) 500 MG CAPS Take by mouth daily.   cholecalciferol (VITAMIN D3) 25 MCG (1000  UT) tablet Take 1,000 Units by mouth daily.   Dulaglutide (TRULICITY) 7.07 EM/7.5QG SOPN Inject 0.75 mg into the skin once a week.   fexofenadine (ALLEGRA) 180 MG tablet Take 180 mg by mouth daily as needed for allergies or rhinitis.   folic acid (FOLVITE) 1 MG tablet Take 2 tablets by mouth once daily   glucose blood test strip Use as instructed by checking blood sugar 1 - 2 times daily Dx E11.9   Lancets (ONETOUCH ULTRASOFT) lancets 1 each by Other route daily at 12 noon. Use to check Glucose once daily as directed.   MAGNESIUM GLYCINATE PO Take 200 mg by mouth daily.   mometasone (ELOCON) 0.1 % cream APPLY  CREAM TOPICALLY ONCE DAILY   Multiple Vitamin (MULTIVITAMIN PO) Take by mouth.   vitamin B-12 (CYANOCOBALAMIN) 500 MCG tablet Take 500 mcg by mouth daily.      Allergies:   Other, Metformin and related, and Sulfa antibiotics   Social History   Socioeconomic History   Marital status: Single    Spouse name: Not on file   Number of children: 1   Years of education: Not on file   Highest education level: Some college, no degree  Occupational History    Employer: [employer]  Tobacco Use   Smoking status: Never   Smokeless tobacco: Never  Vaping Use   Vaping Use: Never used  Substance and Sexual Activity   Alcohol use: Yes    Comment: 2 a month   Drug use: No   Sexual activity: Yes    Partners: Male    Birth control/protection: Post-menopausal  Other Topics Concern   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      Family: Daughter - Hospital doctor - good relationship      Enjoys: watch TV and read      Exercise: dancing, on her own   Diet: does not follow diabetic diet, not eating as much      Safety   Seat belts: Yes    Guns: No   Safe in relationships: Yes        Social Determinants of Radio broadcast assistant Strain: Not on file  Food Insecurity: Not on file  Transportation Needs: Not on file  Physical Activity: Not on file  Stress: Not on file  Social Connections: Not on file     Family History: The patient's ***family history includes Arthritis in her mother; Asthma in her mother; Asthma (age of onset: 42) in her sister; Diabetes in her brother, brother, mother, and sister; Heart attack (age of onset: 41) in her maternal grandfather; Heart attack (age of onset: 59) in her mother; Heart disease in her mother; Hyperlipidemia in her mother; Hypertension in her brother, brother, maternal grandmother, mother, and sister. There is no history of Colon cancer.  ROS:   Please see the history of present illness.    *** All other systems reviewed and are negative.  EKGs/Labs/Other Studies Reviewed:    The  following studies were reviewed today: ***  EKG:  EKG is *** ordered today.  The ekg ordered today demonstrates ***  Recent Labs: 03/18/2021: Magnesium 2.1 09/20/2021: TSH 1.33 10/27/2021: BUN 9; Creatinine 0.92; Hemoglobin 12.9; Platelet Count 141; Potassium 4.0; Sodium 140 12/09/2021: ALT 18  Recent Lipid Panel    Component Value Date/Time  CHOL 219 (H) 12/09/2021 1409   TRIG 48 12/09/2021 1409   HDL 90 12/09/2021 1409   CHOLHDL 2.4 12/09/2021 1409   CHOLHDL 3 06/18/2021 1317   VLDL 11.2 06/18/2021 1317   LDLCALC 121 (H) 12/09/2021 1409     Risk Assessment/Calculations:   {Does this patient have ATRIAL FIBRILLATION?:[phone]}       Physical Exam:    VS:  BP 136/84   Pulse 78   Ht 5' 2"  (1.575 m)   Wt 185 lb (83.9 kg)   LMP 04/30/2012   SpO2 97%   BMI 33.84 kg/m         Wt Readings from Last 3 Encounters:  12/13/21 185 lb (83.9 kg)  10/27/21 188 lb (85.3 kg)  09/20/21 183 lb 4 oz (83.1 kg)     GEN: *** Well nourished, well developed in no acute distress HEENT: Normal NECK: No JVD; No carotid bruits LYMPHATICS: No lymphadenopathy CARDIAC: ***RRR, no murmurs, rubs, gallops RESPIRATORY:  Clear to auscultation without rales, wheezing or rhonchi  ABDOMEN: Soft, non-tender, non-distended MUSCULOSKELETAL:  No edema; No deformity  SKIN: Warm and dry NEUROLOGIC:  Alert and oriented x 3 PSYCHIATRIC:  Normal affect   ASSESSMENT:    No diagnosis found. PLAN:    In order of problems listed above:  ***      {Are you ordering a CV Procedure (e.g. stress test, cath, DCCV, TEE, etc)?   Press F2        :[id number]}    Medication Adjustments/Labs and Tests Ordered: Current medicines are reviewed at length with the patient today.  Concerns regarding medicines are outlined above.  No orders of the defined types were placed in this encounter.  No orders of the defined types were placed in this encounter.   There are no Patient Instructions on file for this  visit.   Hilbert Corrigan, [place]  12/13/2021 1:47 PM    [facility]

## 2021-12-14 ENCOUNTER — Encounter: Payer: Self-pay | Admitting: Physician Assistant

## 2021-12-21 ENCOUNTER — Encounter: Payer: Self-pay | Admitting: Family

## 2021-12-21 ENCOUNTER — Encounter: Payer: Self-pay | Admitting: *Deleted

## 2021-12-21 ENCOUNTER — Ambulatory Visit: Payer: Federal, State, Local not specified - PPO | Admitting: Family

## 2021-12-21 VITALS — BP 130/74 | HR 76 | Temp 98.6°F | Ht 62.0 in | Wt 182.0 lb

## 2021-12-21 DIAGNOSIS — K7581 Nonalcoholic steatohepatitis (NASH): Secondary | ICD-10-CM

## 2021-12-21 DIAGNOSIS — E8809 Other disorders of plasma-protein metabolism, not elsewhere classified: Secondary | ICD-10-CM

## 2021-12-21 DIAGNOSIS — E119 Type 2 diabetes mellitus without complications: Secondary | ICD-10-CM

## 2021-12-21 DIAGNOSIS — L0292 Furuncle, unspecified: Secondary | ICD-10-CM

## 2021-12-21 DIAGNOSIS — R1013 Epigastric pain: Secondary | ICD-10-CM | POA: Diagnosis not present

## 2021-12-21 DIAGNOSIS — R748 Abnormal levels of other serum enzymes: Secondary | ICD-10-CM | POA: Diagnosis not present

## 2021-12-21 DIAGNOSIS — R1011 Right upper quadrant pain: Secondary | ICD-10-CM

## 2021-12-21 DIAGNOSIS — M3119 Other thrombotic microangiopathy: Secondary | ICD-10-CM

## 2021-12-21 DIAGNOSIS — K7460 Unspecified cirrhosis of liver: Secondary | ICD-10-CM

## 2021-12-21 NOTE — Patient Instructions (Addendum)
  Stop by the lab prior to leaving today. I will notify you of your results once received.   ------------------------------------  Your imaging for abdominal ultrasound Has been ordered at the following location.  Please call to schedule a time and date that would work for you.   [address], [place] Phone [phone],  8-430 pm   Welcome to our clinic, I am happy to have you as my new patient. I am excited to continue on this healthcare journey with you.  Stop by the lab prior to leaving today. I will notify you of your results once received.   ------------------------------------  Please keep in mind Any my chart messages you send have up to a three business day turnaround for a response.  Phone calls may take up to a one full business day turnaround for a  response.   If you need a medication refill I recommend you request it through the pharmacy as this is easiest for [place] rather than sending a message and or phone call.   Due to recent changes in healthcare laws, you may see results of your imaging and/or laboratory studies on MyChart before I have had a chance to review them.  I understand that in some cases there may be results that are confusing or concerning to you. Please understand that not all results are received at the same time and often I may need to interpret multiple results in order to provide you with the best plan of care or course of treatment. Therefore, I ask that you please give me 2 business days to thoroughly review all your results before contacting my office for clarification. Should we see a critical lab result, you will be contacted sooner.   It was a pleasure seeing you today! Please do not hesitate to reach out with any questions and or concerns.  Regards,   Eugenia Pancoast FNP-C

## 2021-12-21 NOTE — Progress Notes (Unsigned)
Established Patient Office Visit  Subjective:  Patient ID: Vicki [name], female    DOB: 1961/02/13  Age: 61 y.o. MRN: [id number]  CC:  Chief Complaint  Patient presents with   Establish Care    TOC from [facility] needs lab work     HPI Vicki [name] is here for a transition of care visit.  Prior provider was: [name]  Pt is without acute concerns.   chronic concerns:  DM2: currently taking 0.75 mg once weekly. Average fasting reading on her glucose is around 120-130.  She is on trulicity 7.19 mg qweek, tolerating ok but does experience bloating in her stomach.  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    Past Surgical History:  Procedure Laterality Date   BREAST CYST ASPIRATION Left    COLONOSCOPY  2014   ECTOPIC PREGNANCY SURGERY  1990   IR FLUORO GUIDE CV LINE RIGHT  03/05/2019   IR FLUORO GUIDE CV LINE RIGHT  03/15/2019   IR FLUORO GUIDE CV LINE RIGHT  05/17/2019   IR FLUORO GUIDE CV LINE RIGHT  05/23/2019   IR REMOVAL TUN CV CATH W/O FL  04/12/2019   IR REMOVAL TUN CV CATH W/O FL  08/06/2019   IR US GUIDE VASC ACCESS RIGHT  03/05/2019   IR US GUIDE VASC ACCESS RIGHT  03/15/2019   IR US GUIDE VASC ACCESS RIGHT  05/17/2019   TONSILLECTOMY  1980   UPPER GI ENDOSCOPY  08/2017   [facility]    Family History  Problem Relation Age of Onset   Hypertension Mother    Diabetes Mother    Heart attack Mother 81   Asthma Mother         Childhood   Arthritis Mother    Heart disease Mother    Hyperlipidemia Mother    Hypertension Sister    Diabetes Sister    Asthma Sister 29       asthma attack cause of death   Diabetes Brother    Hypertension Brother    Diabetes Brother    Hypertension Brother    Hypertension Maternal Grandmother    Heart attack Maternal Grandfather 61   Colon cancer Neg Hx     Social History   Socioeconomic History   Marital status: Single    Spouse name: Not on file   Number of children: 1   Years of education: Not on file   Highest education level: Some college, no degree  Occupational History    Employer: [employer]  Tobacco Use   Smoking status: Never   Smokeless tobacco: Never  Vaping Use   Vaping Use: Never used  Substance and Sexual Activity   Alcohol use: Yes    Comment: 2 a month   Drug use: No   Sexual activity: Yes    Partners: Male  Birth control/protection: Post-menopausal  Other Topics Concern   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      Family: Daughter - Hospital doctor - good relationship      Enjoys: watch TV and read      Exercise: dancing, on her own   Diet: does not follow diabetic diet, not eating as much      Safety   Seat belts: Yes    Guns: No   Safe in relationships: Yes        Social Determinants of Radio broadcast assistant Strain: Not on file  Food Insecurity: Not on file  Transportation Needs: Not on file  Physical Activity: Not on file  Stress: Not on file  Social Connections: Not on file  Intimate Partner Violence: Not on file    Outpatient Medications Prior to Visit  Medication Sig Dispense Refill   acetaminophen (TYLENOL) 325 MG tablet Take 650 mg by mouth every 6 (six) hours as needed.     Alum Hydroxide-Mag Carbonate (GAVISCON EXTRA STRENGTH PO) Take 1 tablet by mouth 4 (four) times daily as needed.      Ascorbic Acid (VITAMIN C) 500 MG CAPS Take by mouth daily.     cholecalciferol (VITAMIN D3) 25 MCG (1000 UT) tablet Take 1,000 Units by mouth daily.     Dulaglutide (TRULICITY) 6.44 IH/4.7QQ SOPN Inject 0.75 mg into the skin once a week. 6 mL 0   fexofenadine (ALLEGRA) 180 MG tablet Take 180 mg by mouth daily as needed for allergies or rhinitis.     folic acid (FOLVITE) 1 MG tablet Take 2 tablets by mouth once daily 60 tablet 0   glucose blood test strip Use as instructed by checking blood sugar 1 - 2 times daily Dx E11.9 100 each 1   Lancets (ONETOUCH ULTRASOFT) lancets 1 each by Other route daily at 12 noon. Use to check Glucose once daily as directed. 100 each 1   MAGNESIUM GLYCINATE PO Take 200 mg by mouth daily.     Milk Thistle 1000 MG CAPS Take 1 capsule by mouth daily.     mometasone (ELOCON) 0.1 % cream APPLY  CREAM TOPICALLY ONCE DAILY 45 g 0   Multiple Vitamin (MULTIVITAMIN PO) Take by mouth.     vitamin B-12 (CYANOCOBALAMIN) 500 MCG tablet Take 500 mcg by mouth daily.     Dulaglutide (TRULICITY) 1.5 VZ/5.6LO SOPN Inject 1.5 mg into the skin once a week. (Patient not taking: Reported on 12/13/2021) 6 mL 0   No facility-administered medications prior to visit.    Allergies  Allergen Reactions   Other Other (See Comments)   Metformin And Related Itching    Heart palpitation   Sulfa Antibiotics Itching and Other (See Comments)    ROS Review of Systems  Review of Systems  Respiratory:  Negative for shortness of breath.   Cardiovascular:  Negative for chest pain and palpitations.  Gastrointestinal:  Negative for constipation and diarrhea.  Genitourinary:  Negative for dysuria, frequency and urgency.  Musculoskeletal:  Negative for myalgias.  Psychiatric/Behavioral:  Negative for depression and suicidal ideas.   All other systems reviewed and are negative.    Objective:    Physical Exam  Gen: NAD, resting comfortably CV: RRR with no murmurs appreciated Pulm: NWOB, CTAB  with no crackles, wheezes, or rhonchi Skin:  warm, dry Psych: Normal affect and thought content  BP 130/74   Pulse 76   Temp 98.6 F (37 C) (Oral)   Ht 5' 2"  (1.575 m)   Wt 182 lb (82.6 kg)   LMP 04/30/2012   SpO2 98%   BMI 33.29 kg/m  Wt Readings from Last 3 Encounters:  12/21/21 182 lb (82.6 kg)  12/13/21 185 lb (83.9 kg)  10/27/21 188 lb (85.3 kg)     Health Maintenance Due  Topic Date Due   OPHTHALMOLOGY EXAM  01/13/2021   TETANUS/TDAP  08/03/2021   Diabetic kidney evaluation - Urine ACR  08/06/2021    There are no preventive care reminders to display for this patient.  Lab Results  Component Value Date   TSH 1.33 09/20/2021   Lab Results  Component Value Date   WBC 4.2 10/27/2021   HGB 12.9 10/27/2021   HCT 39.5 10/27/2021   MCV 91.9 10/27/2021   PLT 141 (L) 10/27/2021   Lab Results  Component Value Date   NA 140 10/27/2021   K 4.0 10/27/2021   CO2 30 10/27/2021   GLUCOSE 97 10/27/2021   BUN 9 10/27/2021   CREATININE 0.92 10/27/2021   BILITOT 0.6 12/09/2021   ALKPHOS 135 (H) 12/09/2021   AST 26 12/09/2021   ALT 18 12/09/2021   PROT 7.2 12/09/2021   ALBUMIN 4.5 12/09/2021   CALCIUM 11.0 (H) 10/27/2021   ANIONGAP 6 10/27/2021   EGFR 94 09/10/2021   GFR 87.02 08/05/2021   Lab Results  Component Value Date   CHOL 219 (H) 12/09/2021   Lab Results  Component Value Date   HDL 90 12/09/2021   Lab Results  Component Value Date   LDLCALC 121 (H) 12/09/2021   Lab Results  Component Value Date   TRIG 48 12/09/2021   Lab Results  Component Value Date   CHOLHDL 2.4 12/09/2021   Lab Results  Component Value Date   HGBA1C 7.0 (A) 09/20/2021      Assessment & Plan:   Problem List Items Addressed This Visit   None   No orders of the defined types were placed in this encounter.   Follow-up: No follow-ups on file.    Eugenia Pancoast, FNP

## 2021-12-22 ENCOUNTER — Other Ambulatory Visit (INDEPENDENT_AMBULATORY_CARE_PROVIDER_SITE_OTHER): Payer: Federal, State, Local not specified - PPO

## 2021-12-22 ENCOUNTER — Encounter: Payer: Self-pay | Admitting: Hematology & Oncology

## 2021-12-22 DIAGNOSIS — E119 Type 2 diabetes mellitus without complications: Secondary | ICD-10-CM

## 2021-12-22 DIAGNOSIS — R1013 Epigastric pain: Secondary | ICD-10-CM | POA: Insufficient documentation

## 2021-12-22 LAB — VITAMIN D 25 HYDROXY (VIT D DEFICIENCY, FRACTURES): VITD: 60.2 ng/mL (ref 30.00–100.00)

## 2021-12-22 LAB — CBC WITH DIFFERENTIAL/PLATELET
Basophils Absolute: 0 K/uL (ref 0.0–0.1)
Basophils Relative: 1.2 % (ref 0.0–3.0)
Eosinophils Absolute: 0.1 K/uL (ref 0.0–0.7)
Eosinophils Relative: 3.1 % (ref 0.0–5.0)
HCT: 41.3 % (ref 36.0–46.0)
Hemoglobin: 14 g/dL (ref 12.0–15.0)
Lymphocytes Relative: 37.5 % (ref 12.0–46.0)
Lymphs Abs: 1.4 K/uL (ref 0.7–4.0)
MCHC: 34 g/dL (ref 30.0–36.0)
MCV: 89.7 fl (ref 78.0–100.0)
Monocytes Absolute: 0.2 K/uL (ref 0.1–1.0)
Monocytes Relative: 6.5 % (ref 3.0–12.0)
Neutro Abs: 1.9 K/uL (ref 1.4–7.7)
Neutrophils Relative %: 51.7 % (ref 43.0–77.0)
Platelets: 132 K/uL — ABNORMAL LOW (ref 150.0–400.0)
RBC: 4.6 Mil/uL (ref 3.87–5.11)
RDW: 12.8 % (ref 11.5–15.5)
WBC: 3.7 K/uL — ABNORMAL LOW (ref 4.0–10.5)

## 2021-12-22 LAB — COMPREHENSIVE METABOLIC PANEL
ALT: 21 U/L (ref 0–35)
AST: 28 U/L (ref 0–37)
Albumin: 4.5 g/dL (ref 3.5–5.2)
Alkaline Phosphatase: 107 U/L (ref 39–117)
BUN: 8 mg/dL (ref 6–23)
CO2: 29 mEq/L (ref 19–32)
Calcium: 10 mg/dL (ref 8.4–10.5)
Chloride: 103 mEq/L (ref 96–112)
Creatinine, Ser: 0.77 mg/dL (ref 0.40–1.20)
GFR: 82.75 mL/min (ref >60.00)
Glucose, Bld: 105 mg/dL — ABNORMAL HIGH (ref 70–99)
Potassium: 4 mEq/L (ref 3.5–5.1)
Sodium: 138 mEq/L (ref 135–145)
Total Bilirubin: 1.4 mg/dL — ABNORMAL HIGH (ref 0.2–1.2)
Total Protein: 7.6 g/dL (ref 6.0–8.3)

## 2021-12-22 LAB — HEMOGLOBIN A1C: Hgb A1c MFr Bld: 6.7 % — ABNORMAL HIGH (ref 4.6–6.5)

## 2021-12-22 LAB — LIPASE: Lipase: 19 U/L (ref 11.0–59.0)

## 2021-12-22 LAB — AMYLASE: Amylase: 74 U/L (ref 27–131)

## 2021-12-22 NOTE — Assessment & Plan Note (Signed)
Start omeprazole 20 mg for chronic gastritis Try to decrease and or avoid spicy foods, fried fatty foods, and also caffeine and chocolate as these can increase heartburn symptoms.   U/s ordered to r/o other etiologies. Also pending results of cmp amylase and lipase.

## 2021-12-22 NOTE — Assessment & Plan Note (Signed)
Continue trulicity 7.01 mg qweek Ordered hga1c today pending results. Work on diabetic diet and exercise as tolerated. Yearly foot exam, and annual eye exam.

## 2021-12-22 NOTE — Assessment & Plan Note (Signed)
Follows with hematology, pt requests we redraw cbc for piece of mind, so we will order today. Did advise pt that was most recently drawn, but will draw today again for pt reassurance. Pending results

## 2021-12-22 NOTE — Assessment & Plan Note (Signed)
continue ongoing f/u as scheduled with GI.

## 2021-12-30 ENCOUNTER — Ambulatory Visit
Admission: RE | Admit: 2021-12-30 | Discharge: 2021-12-30 | Disposition: A | Discharge status: Home / Self Care | Payer: Federal, State, Local not specified - PPO | Source: Ambulatory Visit | Attending: Family | Admitting: Family

## 2021-12-30 DIAGNOSIS — R1011 Right upper quadrant pain: Secondary | ICD-10-CM | POA: Diagnosis not present

## 2021-12-30 DIAGNOSIS — R1013 Epigastric pain: Secondary | ICD-10-CM

## 2021-12-30 DIAGNOSIS — K760 Fatty (change of) liver, not elsewhere classified: Secondary | ICD-10-CM | POA: Diagnosis not present

## 2022-01-04 ENCOUNTER — Other Ambulatory Visit: Payer: Self-pay | Admitting: Hematology & Oncology

## 2022-01-04 DIAGNOSIS — M3119 Other thrombotic microangiopathy: Secondary | ICD-10-CM

## 2022-01-11 DIAGNOSIS — Z0289 Encounter for other administrative examinations: Secondary | ICD-10-CM

## 2022-01-19 ENCOUNTER — Other Ambulatory Visit (HOSPITAL_COMMUNITY): Payer: Self-pay

## 2022-01-19 ENCOUNTER — Telehealth: Payer: Self-pay | Admitting: Family

## 2022-01-19 ENCOUNTER — Encounter: Payer: Self-pay | Admitting: Hematology & Oncology

## 2022-01-19 DIAGNOSIS — E119 Type 2 diabetes mellitus without complications: Secondary | ICD-10-CM

## 2022-01-19 NOTE — Telephone Encounter (Signed)
Patient called and stated that the medication Dulaglutide (TRULICITY) 8.86 LR/3.7VG SOPN  the [facility] [address], [place] Phone: [phone]  Fax: [phone]     Is waiting on paperwork back from the provider. Call back number [phone].

## 2022-01-19 NOTE — Telephone Encounter (Signed)
Per [facility], pt is requesting rx for Trulicity 0.52 BL/1.0AI which needs refills. They have a prescription there for her for Trulicity 9.0SM/8.4CA, but they aren't sure which strength the patient is meant to be on. Neither require a PA, but if pt is on Trulicity 9.86JE/8.3GN, a new rx will need to be sent.

## 2022-01-19 NOTE — Telephone Encounter (Signed)
Have we received a prior Auth or see anything in the system in regards to paperwork on Trulicity?

## 2022-01-20 NOTE — Telephone Encounter (Signed)
Sent refill. Pt to continue on 0.75

## 2022-01-21 ENCOUNTER — Other Ambulatory Visit: Payer: Self-pay

## 2022-01-25 MED ORDER — TRULICITY 0.75 MG/0.5ML ~~LOC~~ SOAJ
0.7500 mg | SUBCUTANEOUS | 0 refills | Status: DC
Start: 2022-01-25 — End: 2022-03-23

## 2022-02-01 ENCOUNTER — Other Ambulatory Visit: Payer: Self-pay

## 2022-02-01 MED ORDER — GLUCOSE BLOOD VI STRP: ORAL_STRIP | 1 refills | Status: DC

## 2022-02-02 ENCOUNTER — Other Ambulatory Visit: Payer: Self-pay

## 2022-02-02 ENCOUNTER — Telehealth: Payer: Self-pay

## 2022-02-02 DIAGNOSIS — M3119 Other thrombotic microangiopathy: Secondary | ICD-10-CM

## 2022-02-02 NOTE — Telephone Encounter (Signed)
Pt called stating she has been having some bruises come up periodically all over her body and inquiring if she needs to come in or go to pcp. Informed MD. Pt to come in for labwork only tomorrow. Pt informed and knows we will call her with results.

## 2022-02-03 ENCOUNTER — Telehealth: Payer: Self-pay | Admitting: *Deleted

## 2022-02-03 ENCOUNTER — Inpatient Hospital Stay: Payer: Federal, State, Local not specified - PPO | Attending: Nurse Practitioner

## 2022-02-03 DIAGNOSIS — M3119 Other thrombotic microangiopathy: Secondary | ICD-10-CM | POA: Insufficient documentation

## 2022-02-03 LAB — CBC WITH DIFFERENTIAL (CANCER CENTER ONLY)
Abs Immature Granulocytes: 0 10*3/uL (ref 0.00–0.07)
Basophils Absolute: 0 10*3/uL (ref 0.0–0.1)
Basophils Relative: 1 %
Eosinophils Absolute: 0.1 10*3/uL (ref 0.0–0.5)
Eosinophils Relative: 3 %
HCT: 38.9 % (ref 36.0–46.0)
Hemoglobin: 12.9 g/dL (ref 12.0–15.0)
Immature Granulocytes: 0 %
Lymphocytes Relative: 33 %
Lymphs Abs: 1.3 10*3/uL (ref 0.7–4.0)
MCH: 30 pg (ref 26.0–34.0)
MCHC: 33.2 g/dL (ref 30.0–36.0)
MCV: 90.5 fL (ref 80.0–100.0)
Monocytes Absolute: 0.2 10*3/uL (ref 0.1–1.0)
Monocytes Relative: 6 %
Neutro Abs: 2.2 10*3/uL (ref 1.7–7.7)
Neutrophils Relative %: 57 %
Platelet Count: 131 10*3/uL — ABNORMAL LOW (ref 150–400)
RBC: 4.3 MIL/uL (ref 3.87–5.11)
RDW: 13 % (ref 11.5–15.5)
WBC Count: 3.8 10*3/uL — ABNORMAL LOW (ref 4.0–10.5)
nRBC: 0 % (ref 0.0–0.2)

## 2022-02-03 LAB — CMP (CANCER CENTER ONLY)
ALT: 17 U/L (ref 0–44)
AST: 23 U/L (ref 15–41)
Albumin: 4.3 g/dL (ref 3.5–5.0)
Alkaline Phosphatase: 113 U/L (ref 38–126)
Anion gap: 7 (ref 5–15)
BUN: 10 mg/dL (ref 8–23)
CO2: 29 mmol/L (ref 22–32)
Calcium: 10 mg/dL (ref 8.9–10.3)
Chloride: 103 mmol/L (ref 98–111)
Creatinine: 0.98 mg/dL (ref 0.44–1.00)
GFR, Estimated: >60 mL/min (ref >60)
Glucose, Bld: 297 mg/dL — ABNORMAL HIGH (ref 70–99)
Potassium: 4.5 mmol/L (ref 3.5–5.1)
Sodium: 139 mmol/L (ref 135–145)
Total Bilirubin: 1 mg/dL (ref 0.3–1.2)
Total Protein: 6.7 g/dL (ref 6.5–8.1)

## 2022-02-03 LAB — RETICULOCYTES
Immature Retic Fract: 10.1 % (ref 2.3–15.9)
RBC.: 4.25 MIL/uL (ref 3.87–5.11)
Retic Count, Absolute: 74.8 10*3/uL (ref 19.0–186.0)
Retic Ct Pct: 1.8 % (ref 0.4–3.1)

## 2022-02-03 LAB — SAVE SMEAR(SSMR), FOR PROVIDER SLIDE REVIEW

## 2022-02-03 LAB — LACTATE DEHYDROGENASE: LDH: 114 U/L (ref 98–192)

## 2022-02-03 NOTE — Telephone Encounter (Signed)
Unable to reach pt. LMOVM. MD reviewed lab work, platelets are fine however blood sugar is elevated and this is concerning. Request pt to call office with concerns.

## 2022-02-06 LAB — ADAMTS13 ACTIVITY: Adamts 13 Activity: 89.7 % (ref >66.8)

## 2022-02-06 LAB — ADAMTS13 ACTIVITY REFLEX

## 2022-02-08 ENCOUNTER — Encounter: Payer: Self-pay | Admitting: Hematology & Oncology

## 2022-02-09 ENCOUNTER — Telehealth: Payer: Self-pay | Admitting: *Deleted

## 2022-02-09 NOTE — Telephone Encounter (Signed)
Returned call to pt in reply to [employer] 12/5. Per Dr. Marin Olp he recommends pt speaks with her Liver Specialist about the supplements she is wanting to take.  Relayed this to pt who verbalized understanding.

## 2022-03-09 ENCOUNTER — Encounter: Payer: Self-pay | Admitting: Family Medicine

## 2022-03-09 ENCOUNTER — Ambulatory Visit: Payer: Federal, State, Local not specified - PPO | Admitting: Family Medicine

## 2022-03-09 VITALS — BP 126/82 | HR 79 | Temp 98.2°F | Ht 62.0 in | Wt 186.4 lb

## 2022-03-09 DIAGNOSIS — L732 Hidradenitis suppurativa: Secondary | ICD-10-CM

## 2022-03-09 MED ORDER — DOXYCYCLINE HYCLATE 100 MG PO TABS: 100.0000 mg | ORAL_TABLET | Freq: Two times a day (BID) | ORAL | 0 refills | Status: DC

## 2022-03-09 NOTE — Patient Instructions (Addendum)
It was a pleasure meeting you today. Thank you for allowing me to take part in your health care.  Our goals for today as we discussed include:  Take Doxycycline 100 mg two times a day for 10 days Start Probiotics daily wile on antibiotics and continue for 2 weeks after completion of treatment Warm compresses 4 times a day or as frequently as possible If starts to drain that is ok just cover with mild non stick dressing when needed  This may recur frequently and will need to be treated with antibiotics or can have drained at the office.  Follow up with PCP as needed or if no improvement in symptoms   If you have any questions or concerns, please do not hesitate to call the office at [phone].  I look forward to our next visit and until then take care and stay safe.  Regards,   Carollee Leitz, MD   [facility]

## 2022-03-09 NOTE — Progress Notes (Signed)
   SUBJECTIVE:   Chief Complaint  Patient presents with   Acute Visit    Boils   HPI Patient presents to clinic with concern for boil on her lower abdomen.  Symptoms started few days ago.  Tender to touch.  Busted open and had some drainage.  Has since stopped drainage but remains open.  Denies any fevers.  Endorses tenderness and small lump in the area.  PERTINENT PMH / PSH: Hydradenitis suppurativa  OBJECTIVE:  BP 126/82   Pulse 79   Temp 98.2 F (36.8 C)   Ht '5\' 2"'$  (1.575 m)   Wt 186 lb 6.4 oz (84.6 kg)   LMP 04/30/2012   SpO2 98%   BMI 34.09 kg/m    Physical Exam Vitals reviewed. Exam conducted with a chaperone present.  Skin:    Findings: Abscess (small open area without drainage and underlying firm nonfluctuating mobile tender area noted to right lower abdomen, bikini line) present.     ASSESSMENT/PLAN:  Hidradenitis suppurativa Assessment & Plan: Chronic.  Has had similar symptoms in the past and axillary area that self resolved.  Area remains open however not draining and remains tender to touch and firm. Will treat with doxycycline 100 mg twice daily x 10 days Warm compresses frequently Discussed with patient that may recur as this is a chronic condition. Follow-up with PCP if no improvement in symptoms.  Orders: -     Doxycycline Hyclate; Take 1 tablet (100 mg total) by mouth 2 (two) times daily for 10 days.  Dispense: 20 tablet; Refill: 0   PDMP reviewed  Return if symptoms worsen or fail to improve.  Carollee Leitz, MD

## 2022-03-17 ENCOUNTER — Other Ambulatory Visit: Payer: Self-pay | Admitting: Hematology & Oncology

## 2022-03-17 DIAGNOSIS — M3119 Other thrombotic microangiopathy: Secondary | ICD-10-CM

## 2022-03-20 ENCOUNTER — Encounter: Payer: Self-pay | Admitting: Family Medicine

## 2022-03-20 NOTE — Assessment & Plan Note (Addendum)
Chronic.  Has had similar symptoms in the past and axillary area that self resolved.  Area remains open however not draining and remains tender to touch and firm. Will treat with doxycycline 100 mg twice daily x 10 days Warm compresses frequently Discussed with patient that may recur as this is a chronic condition. Follow-up with PCP if no improvement in symptoms.

## 2022-03-23 ENCOUNTER — Other Ambulatory Visit: Payer: Self-pay | Admitting: Family

## 2022-03-23 ENCOUNTER — Ambulatory Visit: Payer: Federal, State, Local not specified - PPO | Admitting: Podiatry

## 2022-03-23 VITALS — BP 153/70

## 2022-03-23 DIAGNOSIS — B351 Tinea unguium: Secondary | ICD-10-CM | POA: Diagnosis not present

## 2022-03-23 DIAGNOSIS — M79675 Pain in left toe(s): Secondary | ICD-10-CM | POA: Diagnosis not present

## 2022-03-23 DIAGNOSIS — B353 Tinea pedis: Secondary | ICD-10-CM

## 2022-03-23 DIAGNOSIS — M79674 Pain in right toe(s): Secondary | ICD-10-CM

## 2022-03-23 DIAGNOSIS — E119 Type 2 diabetes mellitus without complications: Secondary | ICD-10-CM

## 2022-03-23 DIAGNOSIS — D72819 Decreased white blood cell count, unspecified: Secondary | ICD-10-CM | POA: Insufficient documentation

## 2022-03-23 DIAGNOSIS — K7581 Nonalcoholic steatohepatitis (NASH): Secondary | ICD-10-CM | POA: Insufficient documentation

## 2022-03-23 DIAGNOSIS — D696 Thrombocytopenia, unspecified: Secondary | ICD-10-CM | POA: Insufficient documentation

## 2022-03-23 MED ORDER — KETOCONAZOLE 2 % EX CREA: TOPICAL_CREAM | CUTANEOUS | 1 refills | Status: DC

## 2022-03-23 NOTE — Progress Notes (Signed)
  Subjective:  Patient ID: Vicki [name], female    DOB: [birth year],  MRN: [id number]  [place] presents to clinic today for preventative diabetic foot care and painful thick toenails that are difficult to trim. Pain interferes with ambulation. Aggravating factors include wearing enclosed shoe gear. Pain is relieved with periodic professional debridement.  Chief Complaint  Patient presents with   Nail Problem    DFC BS-130 A1C-6.2 PCP-Dugal, Tabitha PCP VST-01/2022   New problem(s): None.   PCP is Eugenia Pancoast, FNP.  Allergies  Allergen Reactions   Other Other (See Comments)   Metformin And Related Itching    Heart palpitation   Sulfa Antibiotics Itching and Other (See Comments)    Review of Systems: Negative except as noted in the HPI.  Objective: No changes noted in today's physical examination. Vitals:   03/23/22 1526  BP: (!) 153/70   Vicki [name] is a pleasant 62 y.o. female in NAD. AAO x 3.  Vascular Examination: Capillary refill time immediate b/l.Vascular status intact b/l with palpable pedal pulses. Pedal hair present b/l. No edema. No pain with calf compression b/l. Skin temperature gradient WNL b/l. No cyanosis or clubbing noted b/l LE.  Neurological Examination: Sensation grossly intact b/l with 10 gram monofilament. Vibratory sensation intact b/l.   Dermatological Examination: Pedal skin with normal turgor, texture and tone b/l. Toenails b/l great toes and left 5th digit thick, discolored, elongated with subungual debris and pain on dorsal palpation. No open wounds b/l LE.   Mild scaling noted b/l feet. Interdigital maceration resolved.  Musculoskeletal Examination: Muscle strength 5/5 to all lower extremity muscle groups bilaterally. Hammertoe deformity noted 2-5 b/l. Pes planus deformity noted bilateral LE.  Radiographs: None  Assessment/Plan: 1. Pain due to onychomycosis of toenails of both feet   2. Tinea pedis of both  feet   3. Controlled type 2 diabetes mellitus without complication, without long-term current use of insulin ([place])     -Consent given for treatment as described below: -Examined patient. -Mycotic toenails bilateral great toes and left fifth digit were debrided in length and girth with sterile nail nippers and dremel without iatrogenic bleeding. -For tinea pedis, Rx sent to pharmacy for Ketoconazole Cream 2% to be applied once daily for six weeks. -Patient/POA to call should there be question/concern in the interim.   Return in about 3 months (around 06/22/2022).  Marzetta Board, DPM

## 2022-03-27 ENCOUNTER — Encounter: Payer: Self-pay | Admitting: Podiatry

## 2022-03-28 DIAGNOSIS — M545 Low back pain, unspecified: Secondary | ICD-10-CM | POA: Diagnosis not present

## 2022-03-29 DIAGNOSIS — E119 Type 2 diabetes mellitus without complications: Secondary | ICD-10-CM | POA: Diagnosis not present

## 2022-03-29 DIAGNOSIS — K7581 Nonalcoholic steatohepatitis (NASH): Secondary | ICD-10-CM | POA: Diagnosis not present

## 2022-03-29 DIAGNOSIS — K7460 Unspecified cirrhosis of liver: Secondary | ICD-10-CM | POA: Diagnosis not present

## 2022-03-29 DIAGNOSIS — Z79899 Other long term (current) drug therapy: Secondary | ICD-10-CM | POA: Diagnosis not present

## 2022-04-07 DIAGNOSIS — E119 Type 2 diabetes mellitus without complications: Secondary | ICD-10-CM | POA: Diagnosis not present

## 2022-04-07 DIAGNOSIS — H524 Presbyopia: Secondary | ICD-10-CM | POA: Diagnosis not present

## 2022-04-07 DIAGNOSIS — H0102B Squamous blepharitis left eye, upper and lower eyelids: Secondary | ICD-10-CM | POA: Diagnosis not present

## 2022-04-07 DIAGNOSIS — H0102A Squamous blepharitis right eye, upper and lower eyelids: Secondary | ICD-10-CM | POA: Diagnosis not present

## 2022-04-07 DIAGNOSIS — H40013 Open angle with borderline findings, low risk, bilateral: Secondary | ICD-10-CM | POA: Diagnosis not present

## 2022-04-07 LAB — HM DIABETES EYE EXAM

## 2022-04-20 ENCOUNTER — Telehealth: Payer: Self-pay | Admitting: Family

## 2022-04-20 DIAGNOSIS — E119 Type 2 diabetes mellitus without complications: Secondary | ICD-10-CM

## 2022-04-20 NOTE — Telephone Encounter (Signed)
Prescription Request  04/20/2022  Is this a "Controlled Substance" medicine? No  LOV: 12/21/2021  What is the name of the medication or equipment? TRULICITY [id number] [id number] SOPN   Have you contacted your pharmacy to request a refill? Yes   Which pharmacy would you like this sent to?  [facility] [address] [place] [address] Phone: [phone] Fax: [phone]    Patient notified that their request is being sent to the clinical staff for review and that they should receive a response within 2 business days.   Please advise at Mobile [phone] (mobile)

## 2022-04-21 MED ORDER — TRULICITY 0.75 MG/0.5ML ~~LOC~~ SOAJ: 0.7500 mg | SUBCUTANEOUS | 1 refills | Status: DC

## 2022-04-21 NOTE — Addendum Note (Signed)
Addended by: Eugenia Pancoast on: 04/21/2022 09:04 AM   Modules accepted: Orders

## 2022-04-28 ENCOUNTER — Telehealth: Payer: Self-pay | Admitting: Family

## 2022-04-28 NOTE — Telephone Encounter (Signed)
Patient would to have a MD. She would like to transfer from [place] to Dr Volanda Napoleon in [place]. Is this ok with both of you?

## 2022-04-28 NOTE — Telephone Encounter (Signed)
This is ok with me  

## 2022-04-29 ENCOUNTER — Encounter: Payer: Self-pay | Admitting: Hematology & Oncology

## 2022-04-29 ENCOUNTER — Inpatient Hospital Stay: Payer: Federal, State, Local not specified - PPO | Attending: Hematology & Oncology

## 2022-04-29 ENCOUNTER — Inpatient Hospital Stay: Payer: Federal, State, Local not specified - PPO | Admitting: Hematology & Oncology

## 2022-04-29 VITALS — BP 158/74 | HR 80 | Temp 98.7°F | Resp 17 | Wt 185.0 lb

## 2022-04-29 DIAGNOSIS — M3119 Other thrombotic microangiopathy: Secondary | ICD-10-CM

## 2022-04-29 DIAGNOSIS — Z79899 Other long term (current) drug therapy: Secondary | ICD-10-CM | POA: Insufficient documentation

## 2022-04-29 DIAGNOSIS — E118 Type 2 diabetes mellitus with unspecified complications: Secondary | ICD-10-CM | POA: Insufficient documentation

## 2022-04-29 LAB — CMP (CANCER CENTER ONLY)
ALT: 14 U/L (ref 0–44)
AST: 22 U/L (ref 15–41)
Albumin: 4.5 g/dL (ref 3.5–5.0)
Alkaline Phosphatase: 97 U/L (ref 38–126)
Anion gap: 8 (ref 5–15)
BUN: 8 mg/dL (ref 8–23)
CO2: 28 mmol/L (ref 22–32)
Calcium: 10.6 mg/dL — ABNORMAL HIGH (ref 8.9–10.3)
Chloride: 101 mmol/L (ref 98–111)
Creatinine: 0.91 mg/dL (ref 0.44–1.00)
GFR, Estimated: >60 mL/min (ref >60)
Glucose, Bld: 193 mg/dL — ABNORMAL HIGH (ref 70–99)
Potassium: 4.1 mmol/L (ref 3.5–5.1)
Sodium: 137 mmol/L (ref 135–145)
Total Bilirubin: 1 mg/dL (ref 0.3–1.2)
Total Protein: 7.6 g/dL (ref 6.5–8.1)

## 2022-04-29 LAB — CBC WITH DIFFERENTIAL (CANCER CENTER ONLY)
Abs Immature Granulocytes: 0.04 10*3/uL (ref 0.00–0.07)
Basophils Absolute: 0 10*3/uL (ref 0.0–0.1)
Basophils Relative: 1 %
Eosinophils Absolute: 0.1 10*3/uL (ref 0.0–0.5)
Eosinophils Relative: 3 %
HCT: 40.7 % (ref 36.0–46.0)
Hemoglobin: 13.7 g/dL (ref 12.0–15.0)
Immature Granulocytes: 1 %
Lymphocytes Relative: 36 %
Lymphs Abs: 1.4 10*3/uL (ref 0.7–4.0)
MCH: 30.1 pg (ref 26.0–34.0)
MCHC: 33.7 g/dL (ref 30.0–36.0)
MCV: 89.5 fL (ref 80.0–100.0)
Monocytes Absolute: 0.2 10*3/uL (ref 0.1–1.0)
Monocytes Relative: 6 %
Neutro Abs: 2.1 10*3/uL (ref 1.7–7.7)
Neutrophils Relative %: 53 %
Platelet Count: 139 10*3/uL — ABNORMAL LOW (ref 150–400)
RBC: 4.55 MIL/uL (ref 3.87–5.11)
RDW: 12.7 % (ref 11.5–15.5)
WBC Count: 3.9 10*3/uL — ABNORMAL LOW (ref 4.0–10.5)
nRBC: 0 % (ref 0.0–0.2)

## 2022-04-29 LAB — SAVE SMEAR(SSMR), FOR PROVIDER SLIDE REVIEW

## 2022-04-29 LAB — LACTATE DEHYDROGENASE: LDH: 130 U/L (ref 98–192)

## 2022-04-29 LAB — RETICULOCYTES
Immature Retic Fract: 12.4 % (ref 2.3–15.9)
RBC.: 4.55 MIL/uL (ref 3.87–5.11)
Retic Count, Absolute: 74.6 10*3/uL (ref 19.0–186.0)
Retic Ct Pct: 1.6 % (ref 0.4–3.1)

## 2022-04-29 MED ORDER — POTASSIUM CHLORIDE CRYS ER 20 MEQ PO TBCR: 20.0000 meq | EXTENDED_RELEASE_TABLET | Freq: Every day | ORAL | 5 refills | Status: DC

## 2022-04-29 MED ORDER — METOPROLOL TARTRATE 50 MG PO TABS: 50.0000 mg | ORAL_TABLET | Freq: Two times a day (BID) | ORAL | 6 refills | Status: DC

## 2022-04-29 MED ORDER — FUROSEMIDE 40 MG PO TABS: 40.0000 mg | ORAL_TABLET | Freq: Every day | ORAL | 5 refills | Status: DC

## 2022-04-29 NOTE — Progress Notes (Signed)
Hematology and Oncology Follow Up Visit  Vicki [name] [id number] [birth year] 62 y.o. 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 is back for her follow-up.  We last saw her 6 months ago.  She has been doing fairly well.  Unfortunately, she has lost her family doctor.  This is can be a huge problem for her.  She has a lot of other health issues.  Her biggest issue clearly is diabetes.  Her blood sugars just are not well-controlled.  Hopefully, she will be able to get a family doctor to help with her blood sugars and also with her blood pressure.  In the meantime, I will call in her Lopressor and Lasix and potassium.  I think she needs these.  When we last saw her back in August, her ADAMTS-13 level was over 100%.  We last saw her back in February.  At that time, she had a normal  ADAMTS-13 level of greater than 100%.  She is busy helping to take care of her mom.  Her mom is at the 65 years old.  There is been no problems with fever.  She has had no issues with COVID.  She is due for mammogram this year.  She also needs to have a colonoscopy.  Currently, I would have to say that her performance status is probably ECOG 1.    Medications:  Current Outpatient Medications:    acetaminophen (TYLENOL) 325 MG tablet, Take 650 mg by mouth every 6 (six) hours as needed., Disp: , Rfl:    Alum Hydroxide-Mag Carbonate (GAVISCON EXTRA STRENGTH PO), Take 1 tablet by mouth 4 (four) times daily as needed., Disp: , Rfl:    Ascorbic Acid (VITAMIN C) 500 MG CAPS, Take by mouth daily., Disp: , Rfl:    cholecalciferol (VITAMIN D3) 25 MCG (1000 UT) tablet, Take 1,000 Units by mouth daily., Disp: , Rfl:    Dulaglutide (TRULICITY) [id number] [id number] SOPN, Inject 0.75 mg into the skin  once a week., Disp: 6 mL, Rfl: 1   fexofenadine (ALLEGRA) 180 MG tablet, Take 180 mg by mouth daily as needed for allergies or rhinitis., Disp: , Rfl:    folic acid (FOLVITE) 1 MG tablet, Take 2 tablets by mouth once daily, Disp: 60 tablet, Rfl: 0   glucose blood test strip, Use as instructed by checking blood sugar 1 - 2 times daily Dx E11.9, Disp: 100 each, Rfl: 1   ketoconazole (NIZORAL) 2 % cream, Apply to both feet and between toes once daily for 6 weeks., Disp: 60 g, Rfl: 1   Lancets (ONETOUCH ULTRASOFT) lancets, 1 each by Other route daily at 12 noon. Use to check Glucose once daily as directed., Disp: 100 each, Rfl: 1   MAGNESIUM GLYCINATE PO, Take 200 mg by mouth daily., Disp: , Rfl:    Milk Thistle 1000 MG CAPS, Take 1 capsule by mouth daily., Disp: , Rfl:    mometasone (ELOCON) 0.1 % cream, APPLY  CREAM TOPICALLY ONCE DAILY, Disp: 45 g, Rfl: 0   Multiple Vitamin (MULTIVITAMIN PO), Take by mouth., Disp: , Rfl:    vitamin B-12 (CYANOCOBALAMIN) 500 MCG tablet, Take 500 mcg by mouth daily., Disp: , Rfl:    furosemide (LASIX) 40 MG tablet, Take 1 tablet by mouth  once daily (Patient not taking: Reported on 04/29/2022), Disp: 30 tablet, Rfl: 0  Allergies:  Allergies  Allergen Reactions   Other Other (See Comments)   Metformin And Related Itching    Heart palpitation   Sulfa Antibiotics Itching and Other (See Comments)    Past Medical History, Surgical history, Social history, and Family History were reviewed and updated.  Review of Systems: Review of Systems  Constitutional: Negative.   HENT:  Negative.    Eyes: Negative.   Respiratory: Negative.    Cardiovascular:  Positive for leg swelling.  Gastrointestinal: Negative.   Endocrine: Negative.   Genitourinary: Negative.    Musculoskeletal:  Positive for arthralgias and myalgias.  Skin: Negative.   Neurological: Negative.   Hematological: Negative.   Psychiatric/Behavioral: Negative.      Physical Exam:  weight is 185 lb  (83.9 kg). Her oral temperature is 98.7 F (37.1 C). Her blood pressure is 158/74 (abnormal) and her pulse is 80. Her respiration is 17 and oxygen saturation is 99%.   Wt Readings from Last 3 Encounters:  04/29/22 185 lb (83.9 kg)  03/09/22 186 lb 6.4 oz (84.6 kg)  12/21/21 182 lb (82.6 kg)    Physical Exam Vitals reviewed.  HENT:     Head: Normocephalic and atraumatic.  Eyes:     Pupils: Pupils are equal, round, and reactive to light.  Cardiovascular:     Rate and Rhythm: Normal rate and regular rhythm.     Heart sounds: Normal heart sounds.  Pulmonary:     Effort: Pulmonary effort is normal.     Breath sounds: Normal breath sounds.  Abdominal:     General: Bowel sounds are normal.     Palpations: Abdomen is soft.  Musculoskeletal:        General: No tenderness or deformity. Normal range of motion.     Cervical back: Normal range of motion.     Comments: Extremities shows some 1+ edema in her lower legs.  She has good range of motion of her joints.  She has good strength in upper and lower extremities.  Lymphadenopathy:     Cervical: No cervical adenopathy.  Skin:    General: Skin is warm and dry.     Findings: No erythema or rash.  Neurological:     Mental Status: She is alert and oriented to person, place, and time.  Psychiatric:        Behavior: Behavior normal.        Thought Content: Thought content normal.        Judgment: Judgment normal.      Lab Results  Component Value Date   WBC 3.9 (L) 04/29/2022   HGB 13.7 04/29/2022   HCT 40.7 04/29/2022   MCV 89.5 04/29/2022   PLT 139 (L) 04/29/2022     Chemistry      Component Value Date/Time   NA 139 02/03/2022 1030   NA 143 09/10/2021 1601   K 4.5 02/03/2022 1030   CL 103 02/03/2022 1030   CO2 29 02/03/2022 1030   BUN 10 02/03/2022 1030   BUN 9 09/10/2021 1601   CREATININE 0.98 02/03/2022 1030   CREATININE 0.70 09/25/2015 1504      Component Value Date/Time   CALCIUM 10.0 02/03/2022 1030   ALKPHOS  113 02/03/2022 1030   AST 23 02/03/2022 1030   ALT 17 02/03/2022 1030   BILITOT 1.0 02/03/2022 1030      Impression and Plan: Ms. Helvey is a 62 year old African-American female.  She has acquired TTP.  She had relapsed.  She responded well to treatment with plasma exchange and Rituxan.  She is now has been off treatment now for close to 3 years.    Again, I do not see any problems with respect to the TTP.  I do believe that her problems are certainly from her diabetes.  Again hopefully a family doctor will be able to see her and help.  We will still plan to get her back in about 4 months now.  I think we may have to get her back a little bit sooner given the fact that she will has no other doctor and that we need to make sure that she does not run due to other health problems.     Volanda Napoleon, MD 2/23/20243:07 PM

## 2022-05-01 LAB — ADAMTS13 ACTIVITY: Adamts 13 Activity: 94.7 % (ref >66.8)

## 2022-05-01 LAB — ADAMTS13 ACTIVITY REFLEX

## 2022-05-12 DIAGNOSIS — K7581 Nonalcoholic steatohepatitis (NASH): Secondary | ICD-10-CM | POA: Diagnosis not present

## 2022-05-12 DIAGNOSIS — K7460 Unspecified cirrhosis of liver: Secondary | ICD-10-CM | POA: Diagnosis not present

## 2022-05-23 ENCOUNTER — Other Ambulatory Visit: Payer: Self-pay | Admitting: Family

## 2022-05-23 ENCOUNTER — Other Ambulatory Visit: Payer: Self-pay | Admitting: Hematology & Oncology

## 2022-05-23 DIAGNOSIS — M3119 Other thrombotic microangiopathy: Secondary | ICD-10-CM

## 2022-05-30 ENCOUNTER — Ambulatory Visit (INDEPENDENT_AMBULATORY_CARE_PROVIDER_SITE_OTHER): Payer: Federal, State, Local not specified - PPO | Admitting: Obstetrics & Gynecology

## 2022-05-30 ENCOUNTER — Encounter: Payer: Self-pay | Admitting: Obstetrics & Gynecology

## 2022-05-30 VITALS — BP 124/80 | HR 77

## 2022-05-30 DIAGNOSIS — N6320 Unspecified lump in the left breast, unspecified quadrant: Secondary | ICD-10-CM | POA: Diagnosis not present

## 2022-05-30 NOTE — Progress Notes (Signed)
    Vicki [name] 10-Feb-1961 [id number]        62 y.o.  G1P0011   RP: Left breast lump felt last week  HPI: Left breast lump felt last week.  Rechecked 2 days ago and didn't feel it anymore.  No change in skin, no nipple d/c, no pain.  Rt breast normal.  Last Mammo Neg 11/25/2020, overdue.  No Fam h/o Breast Ca.   OB History  Gravida Para Term Preterm AB Living  1 0 0 0 1 1  SAB IAB Ectopic Multiple Live Births  0 0 1 0 1    # Outcome Date GA Lbr Len/2nd Weight Sex Delivery Anes PTL Lv  1 Ectopic             Past medical history,surgical history, problem list, medications, allergies, family history and social history were all reviewed and documented in the EPIC chart.   Directed ROS with pertinent positives and negatives documented in the history of present illness/assessment and plan.  Exam:  Vitals:   05/30/22 1618  BP: 124/80  Pulse: 77  SpO2: 98%   General appearance:  Normal  Breast exam:  Rt breast normal.  No Rt axillary node felt.                        Lt breast:  Increased density at 3-4 O'Clock and 9 O'Clock close to the nipple.  Mildly tender at those locations.  No change in skin.  No nipple d/c.  No Lt axillary node felt.  Assessment/Plan:  62 y.o. G1P0011   1. Left breast lumps at 3-4 O'Clock and 9 O'Clock Left breast lump felt last week.  Rechecked 2 days ago and didn't feel it anymore.  No change in skin, no nipple d/c, no pain.  Rt breast normal.  Last Mammo Neg 11/25/2020, overdue.  No Fam h/o Breast Ca.   Rt breast normal.  No Rt axillary node felt.                        Lt breast:  Increased density at 3-4 O'Clock and 9 O'Clock close to the nipple.  Mildly tender at those locations.  No change in skin.  No nipple d/c.  No Lt axillary node felt.  Will schedule a Left Dx mammo/US and Rt screening mammo.  Other orders - Magnesium Gluconate 550 MG TABS; Take by mouth.   Princess Bruins MD, 4:20 PM 05/30/2022

## 2022-05-31 ENCOUNTER — Telehealth: Payer: Self-pay

## 2022-05-31 DIAGNOSIS — N644 Mastodynia: Secondary | ICD-10-CM

## 2022-05-31 DIAGNOSIS — R9230 Dense breasts, unspecified: Secondary | ICD-10-CM

## 2022-05-31 NOTE — Telephone Encounter (Signed)
Diagnostic breast imaging orders placed.  Spoke with Vicki [name] and scheduled patient for 06/09/22 at 9:00am at [facility]. Patient will check in at 8:45 am.

## 2022-05-31 NOTE — Telephone Encounter (Signed)
Schedule Lt Dx mammo/US and Rt screening mammo Received: Julien Girt, MD  [facility] Triage Left breast lump felt last week.  Rechecked 2 days ago and didn't feel it anymore.  No change in skin, no nipple d/c, no pain.  Rt breast normal.  Last Mammo Neg 11/25/2020, overdue.  No Fam h/o Breast Ca.   Rt breast normal.  No Rt axillary node felt.                        Lt breast:  Increased density at 3-4 O'Clock and 9 O'Clock close to the nipple.  Mildly tender at those locations.  No change in skin.  No nipple d/c.  No Lt axillary node felt.  Will schedule a Left Dx mammo/US and Rt screening mammo.

## 2022-05-31 NOTE — Telephone Encounter (Signed)
Patient called back. Patient was informed of date/time and instructions.

## 2022-05-31 NOTE — Telephone Encounter (Signed)
Called patient and left message to call -

## 2022-06-09 ENCOUNTER — Ambulatory Visit
Admission: RE | Admit: 2022-06-09 | Discharge: 2022-06-09 | Disposition: A | Discharge status: Home / Self Care | Payer: Federal, State, Local not specified - PPO | Source: Ambulatory Visit | Attending: Obstetrics & Gynecology | Admitting: Obstetrics & Gynecology

## 2022-06-09 DIAGNOSIS — N644 Mastodynia: Secondary | ICD-10-CM

## 2022-06-09 DIAGNOSIS — R9230 Dense breasts, unspecified: Secondary | ICD-10-CM

## 2022-06-09 DIAGNOSIS — R922 Inconclusive mammogram: Secondary | ICD-10-CM | POA: Diagnosis not present

## 2022-06-23 ENCOUNTER — Ambulatory Visit: Payer: Federal, State, Local not specified - PPO | Admitting: Internal Medicine

## 2022-06-23 ENCOUNTER — Encounter: Payer: Self-pay | Admitting: Internal Medicine

## 2022-06-23 VITALS — BP 136/78 | HR 85 | Temp 98.0°F | Ht 62.0 in | Wt 186.0 lb

## 2022-06-23 DIAGNOSIS — G629 Polyneuropathy, unspecified: Secondary | ICD-10-CM | POA: Diagnosis not present

## 2022-06-23 LAB — RENAL FUNCTION PANEL
Albumin: 4.4 g/dL (ref 3.5–5.2)
BUN: 8 mg/dL (ref 6–23)
CO2: 28 mEq/L (ref 19–32)
Calcium: 10 mg/dL (ref 8.4–10.5)
Chloride: 104 mEq/L (ref 96–112)
Creatinine, Ser: 0.77 mg/dL (ref 0.40–1.20)
GFR: 82.46 mL/min (ref >60.00)
Glucose, Bld: 168 mg/dL — ABNORMAL HIGH (ref 70–99)
Phosphorus: 2.1 mg/dL — ABNORMAL LOW (ref 2.3–4.6)
Potassium: 3.9 mEq/L (ref 3.5–5.1)
Sodium: 139 mEq/L (ref 135–145)

## 2022-06-23 LAB — CBC
HCT: 40.5 % (ref 36.0–46.0)
Hemoglobin: 13.7 g/dL (ref 12.0–15.0)
MCHC: 33.8 g/dL (ref 30.0–36.0)
MCV: 90.1 fl (ref 78.0–100.0)
Platelets: 130 10*3/uL — ABNORMAL LOW (ref 150.0–400.0)
RBC: 4.49 Mil/uL (ref 3.87–5.11)
RDW: 13.5 % (ref 11.5–15.5)
WBC: 3.7 10*3/uL — ABNORMAL LOW (ref 4.0–10.5)

## 2022-06-23 LAB — VITAMIN B12: Vitamin B-12: >1500 pg/mL — ABNORMAL HIGH (ref 211–911)

## 2022-06-23 LAB — HEMOGLOBIN A1C: Hgb A1c MFr Bld: 6.8 % — ABNORMAL HIGH (ref 4.6–6.5)

## 2022-06-23 LAB — HEPATIC FUNCTION PANEL
ALT: 20 U/L (ref 0–35)
AST: 28 U/L (ref 0–37)
Albumin: 4.4 g/dL (ref 3.5–5.2)
Alkaline Phosphatase: 127 U/L — ABNORMAL HIGH (ref 39–117)
Bilirubin, Direct: 0.2 mg/dL (ref 0.0–0.3)
Total Bilirubin: 0.7 mg/dL (ref 0.2–1.2)
Total Protein: 7.3 g/dL (ref 6.0–8.3)

## 2022-06-23 LAB — TSH: TSH: 1.28 u[IU]/mL (ref 0.35–5.50)

## 2022-06-23 MED ORDER — GABAPENTIN 100 MG PO CAPS: 100.0000 mg | ORAL_CAPSULE | Freq: Every evening | ORAL | 1 refills | Status: DC | PRN

## 2022-06-23 NOTE — Progress Notes (Signed)
Subjective:    Patient ID: Vicki [name], female    DOB: Jul 17, 1960, 62 y.o.   MRN: [id number]  HPI Here due to burning in feet and up ankles and calves  Both legs Goes back for a while--mostly tingling over a few months Burning is recent Seems some better---?related to taking her trulicity dose  Some achy feeling in hands--but not burning Some pins and needles if lies on her arms  Sugars running up in the last few months Up to 157 fasting  Current Outpatient Medications on File Prior to Visit  Medication Sig Dispense Refill   acetaminophen (TYLENOL) 325 MG tablet Take 650 mg by mouth every 6 (six) hours as needed.     Alum Hydroxide-Mag Carbonate (GAVISCON EXTRA STRENGTH PO) Take 1 tablet by mouth 4 (four) times daily as needed.     Ascorbic Acid (VITAMIN C) 500 MG CAPS Take by mouth daily.     cholecalciferol (VITAMIN D3) 25 MCG (1000 UT) tablet Take 1,000 Units by mouth daily.     Dulaglutide (TRULICITY) 0.75 MG/0.5ML SOPN Inject 0.75 mg into the skin once a week. 6 mL 1   fexofenadine (ALLEGRA) 180 MG tablet Take 180 mg by mouth daily as needed for allergies or rhinitis.     folic acid (FOLVITE) 1 MG tablet Take 2 tablets by mouth once daily 60 tablet 0   Lancets (ONETOUCH ULTRASOFT) lancets 1 each by Other route daily at 12 noon. Use to check Glucose once daily as directed. 100 each 1   Magnesium Gluconate 550 MG TABS Take by mouth.     Milk Thistle 1000 MG CAPS Take 1 capsule by mouth daily.     mometasone (ELOCON) 0.1 % cream APPLY  CREAM TOPICALLY ONCE DAILY 45 g 0   Multiple Vitamin (MULTIVITAMIN PO) Take by mouth.     ONETOUCH ULTRA test strip USE AS INSTRUCTED BY CHECKING BLOOD SUGAR 1-2 TIMES DAILY 100 each 0   vitamin B-12 (CYANOCOBALAMIN) 500 MCG tablet Take 500 mcg by mouth daily.     No current facility-administered medications on file prior to visit.    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    Past Surgical History:  Procedure Laterality Date   BREAST CYST ASPIRATION Left    COLONOSCOPY  2014   ECTOPIC PREGNANCY SURGERY  1990   IR FLUORO GUIDE CV LINE RIGHT  03/05/2019   IR FLUORO GUIDE CV LINE RIGHT  03/15/2019   IR FLUORO GUIDE CV LINE RIGHT  05/17/2019   IR FLUORO GUIDE CV LINE RIGHT  05/23/2019   IR REMOVAL TUN CV CATH W/O FL  04/12/2019   IR REMOVAL TUN CV CATH W/O FL  08/06/2019   IR US GUIDE VASC ACCESS RIGHT  03/05/2019   IR US GUIDE VASC ACCESS RIGHT  03/15/2019   IR US GUIDE VASC ACCESS RIGHT  05/17/2019   TONSILLECTOMY  1980   UPPER GI ENDOSCOPY  08/2017   [facility]    Family History  Problem Relation Age of Onset   Hypertension Mother    Diabetes Mother    Heart attack Mother 45   Asthma Mother        Childhood   Arthritis Mother    Heart disease Mother    Hyperlipidemia Mother    Hypertension Sister    Diabetes Sister    Asthma Sister 40       asthma attack cause of death   Diabetes Brother    Hypertension Brother    Diabetes Brother    Hypertension Brother    Hypertension Maternal Grandmother    Heart attack Maternal Grandfather 98   Colon cancer Neg Hx     Social History   Socioeconomic History   Marital status: Single    Spouse name: Not on file   Number of children: 1   Years of education: Not on file   Highest education level: Some college, no degree  Occupational History    Employer: [employer]  Tobacco Use    Smoking status: Never   Smokeless tobacco: Never  Vaping Use   Vaping Use: Never used  Substance and Sexual Activity   Alcohol use: Yes    Comment: 2 a month   Drug use: No   Sexual activity: Not Currently    Partners: Male    Birth control/protection: Post-menopausal  Other Topics Concern   Not on file  Social History Narrative   02/26/19   From: the area   Living: lives with roommate - they get along   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      Family: Daughter - Higher education careers adviser - good relationship currently age 84       Enjoys: watch TV and read      Exercise: dancing, on her own   Diet: does not follow diabetic diet, not eating as much      Safety   Seat belts: Yes    Guns: No   Safe in relationships: Yes        Social Determinants of Corporate investment banker Strain: Not on file  Food Insecurity: Not on file  Transportation Needs: Not on file  Physical Activity: Not on file  Stress: Not on file  Social Connections: Not on file  Intimate Partner Violence: Not on file   Review of Systems More bruising of late--wonders about her TTP No longer on Rx (plasma exchange)    Objective:   Physical Exam Constitutional:      Appearance: Normal appearance.  Cardiovascular:     Rate and Rhythm: Normal rate and regular rhythm.     Pulses: Normal pulses.     Heart sounds: No murmur heard.    No gallop.  Pulmonary:     Effort: Pulmonary effort is normal.     Breath sounds: Normal breath sounds. No wheezing or rales.  Musculoskeletal:     Cervical back: Neck supple.  Lymphadenopathy:     Cervical: No cervical adenopathy.  Skin:    Comments: No foot lesions  Neurological:     Mental Status: She is alert.     Comments: Decreased fine touch sensation in feet            Assessment & Plan:

## 2022-06-23 NOTE — Assessment & Plan Note (Signed)
Almost certainly from the diabetes--but will check other labs Rx gabapentin 100---to try prn at night (start with 100 and increase as needed)

## 2022-06-27 LAB — PROTEIN ELECTROPHORESIS, SERUM, WITH REFLEX
Albumin ELP: 4.4 g/dL (ref 3.8–4.8)
Alpha 1: 0.2 g/dL (ref 0.2–0.3)
Alpha 2: 0.7 g/dL (ref 0.5–0.9)
Beta 2: 0.5 g/dL (ref 0.2–0.5)
Beta Globulin: 0.4 g/dL (ref 0.4–0.6)
Gamma Globulin: 1.2 g/dL (ref 0.8–1.7)
Total Protein: 7.4 g/dL (ref 6.1–8.1)

## 2022-07-08 ENCOUNTER — Other Ambulatory Visit: Payer: Self-pay

## 2022-07-08 DIAGNOSIS — E785 Hyperlipidemia, unspecified: Secondary | ICD-10-CM

## 2022-07-09 LAB — LIPID PANEL
Chol/HDL Ratio: 2.3 ratio (ref 0.0–4.4)
Cholesterol, Total: 200 mg/dL — ABNORMAL HIGH (ref 100–199)
HDL: 86 mg/dL (ref >39)
LDL Chol Calc (NIH): 105 mg/dL — ABNORMAL HIGH (ref 0–99)
Triglycerides: 48 mg/dL (ref 0–149)
VLDL Cholesterol Cal: 9 mg/dL (ref 5–40)

## 2022-07-13 ENCOUNTER — Other Ambulatory Visit: Payer: Self-pay

## 2022-07-13 DIAGNOSIS — E785 Hyperlipidemia, unspecified: Secondary | ICD-10-CM

## 2022-07-13 MED ORDER — ATORVASTATIN CALCIUM 20 MG PO TABS
20.0000 mg | ORAL_TABLET | Freq: Every day | ORAL | 3 refills | Status: DC
Start: 2022-07-13 — End: 2022-10-10

## 2022-07-18 DIAGNOSIS — M545 Low back pain, unspecified: Secondary | ICD-10-CM | POA: Diagnosis not present

## 2022-07-20 ENCOUNTER — Other Ambulatory Visit: Payer: Self-pay | Admitting: Hematology & Oncology

## 2022-07-20 DIAGNOSIS — M3119 Other thrombotic microangiopathy: Secondary | ICD-10-CM

## 2022-07-27 ENCOUNTER — Encounter: Payer: Federal, State, Local not specified - PPO | Admitting: Family Medicine

## 2022-07-28 DIAGNOSIS — M5416 Radiculopathy, lumbar region: Secondary | ICD-10-CM | POA: Diagnosis not present

## 2022-07-29 ENCOUNTER — Encounter: Payer: Self-pay | Admitting: Obstetrics & Gynecology

## 2022-07-29 ENCOUNTER — Other Ambulatory Visit (HOSPITAL_COMMUNITY)
Admission: RE | Admit: 2022-07-29 | Discharge: 2022-07-29 | Disposition: A | Discharge status: Home / Self Care | Payer: Federal, State, Local not specified - PPO | Source: Ambulatory Visit | Attending: Obstetrics & Gynecology | Admitting: Obstetrics & Gynecology

## 2022-07-29 ENCOUNTER — Ambulatory Visit (INDEPENDENT_AMBULATORY_CARE_PROVIDER_SITE_OTHER): Payer: Federal, State, Local not specified - PPO | Admitting: Obstetrics & Gynecology

## 2022-07-29 VITALS — BP 120/80 | HR 80 | Ht 61.25 in | Wt 184.0 lb

## 2022-07-29 DIAGNOSIS — M8589 Other specified disorders of bone density and structure, multiple sites: Secondary | ICD-10-CM

## 2022-07-29 DIAGNOSIS — Z01419 Encounter for gynecological examination (general) (routine) without abnormal findings: Secondary | ICD-10-CM | POA: Diagnosis not present

## 2022-07-29 DIAGNOSIS — R350 Frequency of micturition: Secondary | ICD-10-CM | POA: Diagnosis not present

## 2022-07-29 DIAGNOSIS — Z780 Asymptomatic menopausal state: Secondary | ICD-10-CM

## 2022-07-29 LAB — URINALYSIS, COMPLETE W/RFL CULTURE
Bacteria, UA: NONE SEEN /HPF
Bilirubin Urine: NEGATIVE
Casts: NONE SEEN /LPF
Crystals: NONE SEEN /HPF
Glucose, UA: NEGATIVE
Hgb urine dipstick: NEGATIVE
Hyaline Cast: NONE SEEN /LPF
Ketones, ur: NEGATIVE
Leukocyte Esterase: NEGATIVE
Nitrites, Initial: NEGATIVE
Protein, ur: NEGATIVE
RBC / HPF: NONE SEEN /HPF (ref 0–2)
Specific Gravity, Urine: 1.01 (ref 1.001–1.035)
WBC, UA: NONE SEEN /HPF (ref 0–5)
Yeast: NONE SEEN /HPF
pH: 7.5 (ref 5.0–8.0)

## 2022-07-29 LAB — NO CULTURE INDICATED

## 2022-07-29 NOTE — Progress Notes (Signed)
Vicki [name] Apr 27, 1960 [id number]   History:    62 y.o. G2P1A1L1 Boyfriend   RP:  Established patient presenting for annual gyn exam    HPI: Postmenopausal, well on no hormone replacement therapy.  No postmenopausal bleeding.  No pelvic pain.  No pain with intercourse.  Pap Neg 09/2019.  No h/o abnormal Pap.  Pap reflex today.  Breasts normal. Dx Mammo Rt and Lt Neg with Lt breast US 06/2022.  Urinary frequency and occasional mild SUI. Bowel movements normal. Body mass index improved to 34.48. Bone Density Osteopenia -2.1 in 10/2019.  [facility].  Vit D, Ca++, weight bearing activities.  Health labs with family physician. Colonoscopy in 06/2012, repeat this year.   Past medical history,surgical history, family history and social history were all reviewed and documented in the EPIC chart.  Gynecologic History Patient's last menstrual period was 04/30/2012.  Obstetric History OB History  Gravida Para Term Preterm AB Living  1 0 0 0 1 1  SAB IAB Ectopic Multiple Live Births  0 0 1 0 1    # Outcome Date GA Lbr Len/2nd Weight Sex Delivery Anes PTL Lv  1 Ectopic              ROS: A ROS was performed and pertinent positives and negatives are included in the history. GENERAL: No fevers or chills. HEENT: No change in vision, no earache, sore throat or sinus congestion. NECK: No pain or stiffness. CARDIOVASCULAR: No chest pain or pressure. No palpitations. PULMONARY: No shortness of breath, cough or wheeze. GASTROINTESTINAL: No abdominal pain, nausea, vomiting or diarrhea, melena or bright red blood per rectum. GENITOURINARY: No urinary frequency, urgency, hesitancy or dysuria. MUSCULOSKELETAL: No joint or muscle pain, no back pain, no recent trauma. DERMATOLOGIC: No rash, no itching, no lesions. ENDOCRINE: No polyuria, polydipsia, no heat or cold intolerance. No recent change in weight. HEMATOLOGICAL: No anemia or easy bruising or bleeding. NEUROLOGIC: No headache,  seizures, numbness, tingling or weakness. PSYCHIATRIC: No depression, no loss of interest in normal activity or change in sleep pattern.     Exam:   BP 120/80   Pulse 80   Ht 5' 1.25" (1.556 m)   Wt 184 lb (83.5 kg)   LMP 04/30/2012 Comment: not sexually active  SpO2 99%   BMI 34.48 kg/m   Body mass index is 34.48 kg/m.  General appearance : Well developed well nourished female. No acute distress HEENT: Eyes: no retinal hemorrhage or exudates,  Neck supple, trachea midline, no carotid bruits, no thyroidmegaly Lungs: Clear to auscultation, no rhonchi or wheezes, or rib retractions  Heart: Regular rate and rhythm, no murmurs or gallops Breast:Examined in sitting and supine position were symmetrical in appearance, no palpable masses or tenderness,  no skin retraction, no nipple inversion, no nipple discharge, no skin discoloration, no axillary or supraclavicular lymphadenopathy Abdomen: no palpable masses or tenderness, no rebound or guarding Extremities: no edema or skin discoloration or tenderness  Pelvic: Vulva: Normal             Vagina: No gross lesions or discharge  Cervix: No gross lesions or discharge.  Pap reflex done.  Uterus  AV, normal size, shape and consistency, non-tender and mobile  Adnexa  Without masses or tenderness  Anus: Normal  U/A Negative   Assessment/Plan:  62 y.o. female for annual exam   1. Encounter for routine gynecological examination with Papanicolaou smear of cervix Postmenopausal, well on no hormone replacement therapy.  No postmenopausal bleeding.  No pelvic pain.  No pain with intercourse.  Pap Neg 09/2019.  No h/o abnormal Pap.  Pap reflex today.  Breasts normal. Dx Mammo Rt and Lt Neg with Lt breast US 06/2022.  Urinary frequency and occasional mild SUI. Bowel movements normal. Body mass index improved to 34.48. Bone Density Osteopenia -2.1 in 10/2019.  [facility], [facility].  Vit D, Ca++, weight bearing activities.  Health labs with  family physician. Colonoscopy in 06/2012, repeat this year. - Cytology - PAP( [facility])  2. Postmenopause Postmenopausal, well on no hormone replacement therapy.  No postmenopausal bleeding.  No pelvic pain.  No pain with intercourse.  3. Osteopenia of multiple sites Bone Density Osteopenia -2.1 in 10/2019.  Repeat BD now, at [facility].  Vit D, Ca++, weight bearing activities.   - DG Bone Density; Future  4. Urinary frequency U/A completely Neg, reassured. - Urinalysis,Complete w/RFL Culture  Other orders - Multiple Vitamins-Minerals (ZINC PO); Take by mouth. - REFLEXIVE URINE CULTURE   Genia Del MD, 2:44 PM

## 2022-08-04 LAB — CYTOLOGY - PAP
Chlamydia: NEGATIVE
Comment: NEGATIVE
Comment: NEGATIVE
Comment: NORMAL
Diagnosis: NEGATIVE
High risk HPV: NEGATIVE
Neisseria Gonorrhea: NEGATIVE

## 2022-08-05 ENCOUNTER — Ambulatory Visit (HOSPITAL_COMMUNITY)
Admission: EM | Admit: 2022-08-05 | Discharge: 2022-08-05 | Disposition: A | Discharge status: Home / Self Care | Payer: Federal, State, Local not specified - PPO | Attending: Emergency Medicine | Admitting: Emergency Medicine

## 2022-08-05 ENCOUNTER — Encounter (HOSPITAL_COMMUNITY): Payer: Self-pay | Admitting: Emergency Medicine

## 2022-08-05 DIAGNOSIS — L732 Hidradenitis suppurativa: Secondary | ICD-10-CM | POA: Diagnosis not present

## 2022-08-05 DIAGNOSIS — L02214 Cutaneous abscess of groin: Secondary | ICD-10-CM | POA: Diagnosis not present

## 2022-08-05 MED ORDER — DOXYCYCLINE HYCLATE 100 MG PO CAPS: 100.0000 mg | ORAL_CAPSULE | Freq: Two times a day (BID) | ORAL | 0 refills | Status: DC

## 2022-08-05 NOTE — ED Triage Notes (Signed)
Pt has boils in genital area for over a week. Got large one and now some smaller ones. Some drain. All painful.

## 2022-08-05 NOTE — Discharge Instructions (Addendum)
You have an abscess to your left groin /labial area.  Please use warm compresses with a antibacterial solution like Hibiclens, as well as taking the doxycycline.  You can take this with food to help prevent gastrointestinal upset.  I would also suggest washing with Hibiclens in the shower to help prevent bacterial outbreaks.  Please use cotton underwear and keep the area clean and dry.   Please return to clinic for any new or concerning symptoms, or if no improvement over the next 72 hours for wound recheck.

## 2022-08-05 NOTE — ED Provider Notes (Signed)
[facility]    CSN: [id number] Arrival date & time: 08/05/22  1629      History   Chief Complaint Chief Complaint  Patient presents with   Abscess    HPI Vicki [name] is a 62 y.o. female.   Patient presents to clinic for complaints of multiple abscesses and boils to her groin. Abscesses have been present for the past week now, she shaved prior to going to the gynecologist which was last Friday.  Denies fevers or drainage.  Area to the left groin / mons pubis is the most painful.  Scattered lesions throughout her groin area.  She does have a history of at HS.   The history is provided by the patient and medical records.  Abscess Associated symptoms: no fever     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    Patient Active Problem List   Diagnosis Date Noted   Peripheral neuropathy 06/23/2022   Leukopenia 03/23/2022   Nonalcoholic steatohepatitis (NASH) 03/23/2022   Thrombocytopenia (HCC) 03/23/2022   Hidradenitis suppurativa 03/09/2022   Epigastric pain 12/22/2021   Elevated alkaline phosphatase level 12/21/2021   B12 deficiency 03/18/2021   Vitamin D deficiency 03/18/2021   Hypomagnesuria 03/18/2021   Recurrent boils 10/06/2020   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   Hyperlipidemia 05/15/2020   Essential hypertension 02/18/2020   Vitamin B12 deficiency 02/18/2020   Normocytic anemia 08/15/2019   Cardiac murmur 05/08/2019   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   Insomnia 02/26/2019   Acquired pancytopenia (HCC)  11/01/2018   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/25/2018   Classical migraine with intractable migraine 04/09/2018   Spinal stenosis of lumbar region 01/17/2018   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   Controlled type 2 diabetes mellitus without complication (HCC) 03/31/2014   Elevated liver enzymes 03/31/2014   Tinea pedis 07/31/2013   Plantar fascial fibromatosis 08/31/2012    Past Surgical History:  Procedure Laterality Date   BREAST CYST ASPIRATION Left    COLONOSCOPY  2014   ECTOPIC PREGNANCY SURGERY  1990   IR FLUORO GUIDE CV LINE RIGHT  03/05/2019   IR FLUORO GUIDE CV LINE RIGHT  03/15/2019   IR FLUORO GUIDE CV LINE RIGHT  05/17/2019   IR FLUORO GUIDE CV LINE RIGHT  05/23/2019   IR REMOVAL TUN CV CATH W/O FL  04/12/2019   IR REMOVAL TUN CV CATH W/O FL  08/06/2019   IR US GUIDE VASC ACCESS RIGHT  03/05/2019   IR US GUIDE VASC ACCESS RIGHT  03/15/2019   IR US GUIDE VASC ACCESS RIGHT  05/17/2019   TONSILLECTOMY  1980   UPPER GI ENDOSCOPY  08/2017   [facility]    OB History     Gravida  1   Para  0   Term  0   Preterm  0   AB  1   Living  1      SAB  0   IAB  0  Ectopic  1   Multiple  0   Live Births  1            Home Medications    Prior to Admission medications   Medication Sig Start Date End Date Taking? Authorizing Provider  doxycycline (VIBRAMYCIN) 100 MG capsule Take 1 capsule (100 mg total) by mouth 2 (two) times daily. 08/05/22  Yes Rinaldo Ratel, [place] N, FNP  acetaminophen (TYLENOL) 325 MG tablet Take 650 mg by mouth every 6 (six) hours as needed.    [provider]  Alum Hydroxide-Mag Carbonate (GAVISCON EXTRA STRENGTH PO) Take 1 tablet by mouth 4 (four) times daily as needed. 03/26/19   [provider]  Ascorbic Acid (VITAMIN C) 500 MG CAPS Take by mouth.    [provider]  atorvastatin (LIPITOR) 20 MG tablet Take 1 tablet (20 mg total) by mouth daily. Patient not taking: Reported on  07/29/2022 07/13/22 10/11/22  Azalee Course, PA  cholecalciferol (VITAMIN D3) 25 MCG (1000 UT) tablet Take 1,000 Units by mouth daily.    [provider]  Dulaglutide (TRULICITY) 0.75 MG/0.5ML SOPN Inject 0.75 mg into the skin once a week. 04/21/22 10/18/22  Mort Sawyers, FNP  fexofenadine (ALLEGRA) 180 MG tablet Take 180 mg by mouth daily as needed for allergies or rhinitis.    [provider]  folic acid (FOLVITE) 1 MG tablet Take 2 tablets by mouth once daily 07/20/22   Josph Macho, MD  gabapentin (NEURONTIN) 100 MG capsule Take 1-3 capsules (100-300 mg total) by mouth at bedtime as needed. 06/23/22   Karie Schwalbe, MD  Lancets [facility] ULTRASOFT) lancets 1 each by Other route daily at 12 noon. Use to check Glucose once daily as directed. 12/09/21   Gweneth Dimitri, MD  Magnesium Gluconate 550 MG TABS Take by mouth.    [provider]  Milk Thistle 1000 MG CAPS Take 1 capsule by mouth daily.    [provider]  mometasone (ELOCON) 0.1 % cream APPLY  CREAM TOPICALLY ONCE DAILY 03/17/22   Josph Macho, MD  Multiple Vitamin (MULTIVITAMIN PO) Take by mouth.    [provider]  Multiple Vitamins-Minerals (ZINC PO) Take by mouth.    [provider]  [facility] ULTRA test strip USE AS INSTRUCTED BY CHECKING BLOOD SUGAR 1-2 TIMES DAILY 05/24/22   Mort Sawyers, FNP  vitamin B-12 (CYANOCOBALAMIN) 500 MCG tablet Take 500 mcg by mouth daily.    [provider]    Family History Family History  Problem Relation Age of Onset   Hypertension Mother    Diabetes Mother    Heart attack Mother 40   Asthma Mother        Childhood   Arthritis Mother    Heart disease Mother    Hyperlipidemia Mother    Hypertension Sister    Diabetes Sister    Asthma Sister 40       asthma attack cause of death   Diabetes Brother    Hypertension Brother    Diabetes Brother    Hypertension Brother    Hypertension Maternal Grandmother    Heart attack Maternal  Grandfather 84   Colon cancer Neg Hx     Social History Social History   Tobacco Use   Smoking status: Never   Smokeless tobacco: Never  Vaping Use   Vaping Use: Never used  Substance Use Topics   Alcohol use: Yes    Comment: 2 a month   Drug use: No  Allergies   Metformin and related and Sulfa antibiotics   Review of Systems Review of Systems  Constitutional:  Negative for fever.     Physical Exam Triage Vital Signs ED Triage Vitals  Enc Vitals Group     BP 08/05/22 1702 (!) 164/88     Pulse Rate 08/05/22 1702 83     Resp 08/05/22 1702 18     Temp 08/05/22 1702 99.6 F (37.6 C)     Temp Source 08/05/22 1702 Oral     SpO2 08/05/22 1702 97 %     Weight --      Height --      Head Circumference --      Peak Flow --      Pain Score 08/05/22 1701 4     Pain Loc --      Pain Edu? --      Excl. in GC? --    No data found.  Updated Vital Signs BP (!) 164/88 (BP Location: Left Arm)   Pulse 83   Temp 99.6 F (37.6 C) (Oral)   Resp 18   LMP 04/30/2012 Comment: not sexually active  SpO2 97%   Visual Acuity Right Eye Distance:   Left Eye Distance:   Bilateral Distance:    Right Eye Near:   Left Eye Near:    Bilateral Near:     Physical Exam Vitals and nursing note reviewed.  Constitutional:      Appearance: Normal appearance.  HENT:     Head: Normocephalic and atraumatic.     Right Ear: External ear normal.     Left Ear: External ear normal.     Nose: Nose normal.     Mouth/Throat:     Mouth: Mucous membranes are moist.  Eyes:     Conjunctiva/sclera: Conjunctivae normal.  Cardiovascular:     Rate and Rhythm: Normal rate.  Pulmonary:     Effort: Pulmonary effort is normal. No respiratory distress.  Genitourinary:    General: Normal vulva.       Comments: Scattered small abscesses to groin and perineal area.  Musculoskeletal:        General: Normal range of motion.  Skin:    General: Skin is warm and dry.  Neurological:     General:  No focal deficit present.     Mental Status: She is alert and oriented to person, place, and time.  Psychiatric:        Mood and Affect: Mood normal.        Behavior: Behavior normal. Behavior is cooperative.      UC Treatments / Results  Labs (all labs ordered are listed, but only abnormal results are displayed) Labs Reviewed - No data to display  EKG   Radiology No results found.  Procedures Procedures (including critical care time)  Medications Ordered in UC Medications - No data to display  Initial Impression / Assessment and Plan / UC Course  I have reviewed the triage vital signs and the nursing notes.  Pertinent labs & imaging results that were available during my care of the patient were reviewed by me and considered in my medical decision making (see chart for details).  Vitals and triage reviewed, patient is hemodynamically stable.  Larger lesion to left side of mons pubis that is tender and indurated w/o fluctuance.  Deferred I&D at this point, placed on doxycycline and advised to wash with chlorhexidine and do warm compresses, wound recheck in 72 hours if needed.  Plan  of care, follow-up care and return precautions given, no questions at this time.     Final Clinical Impressions(s) / UC Diagnoses   Final diagnoses:  Abscess of left groin  Hidradenitis suppurativa     Discharge Instructions      You have an abscess to your left groin /labial area.  Please use warm compresses with a antibacterial solution like Hibiclens, as well as taking the doxycycline.  You can take this with food to help prevent gastrointestinal upset.  I would also suggest washing with Hibiclens in the shower to help prevent bacterial outbreaks.  Please use cotton underwear and keep the area clean and dry.   Please return to clinic for any new or concerning symptoms, or if no improvement over the next 72 hours for wound recheck.      ED Prescriptions     Medication Sig Dispense  Auth. Provider   doxycycline (VIBRAMYCIN) 100 MG capsule Take 1 capsule (100 mg total) by mouth 2 (two) times daily. 20 capsule Mitzie Marlar, [place], [place]      PDMP not reviewed this encounter.   Salote Weidmann, [place] N, [place] 08/05/22 1726

## 2022-08-17 ENCOUNTER — Other Ambulatory Visit: Payer: Self-pay | Admitting: Family

## 2022-08-17 ENCOUNTER — Other Ambulatory Visit: Payer: Self-pay | Admitting: Hematology & Oncology

## 2022-08-17 DIAGNOSIS — M3119 Other thrombotic microangiopathy: Secondary | ICD-10-CM

## 2022-08-17 DIAGNOSIS — E119 Type 2 diabetes mellitus without complications: Secondary | ICD-10-CM

## 2022-08-19 DIAGNOSIS — M5136 Other intervertebral disc degeneration, lumbar region: Secondary | ICD-10-CM | POA: Diagnosis not present

## 2022-08-19 DIAGNOSIS — M5451 Vertebrogenic low back pain: Secondary | ICD-10-CM | POA: Diagnosis not present

## 2022-08-19 DIAGNOSIS — M48061 Spinal stenosis, lumbar region without neurogenic claudication: Secondary | ICD-10-CM | POA: Diagnosis not present

## 2022-08-20 ENCOUNTER — Other Ambulatory Visit: Payer: Self-pay | Admitting: Internal Medicine

## 2022-08-22 ENCOUNTER — Encounter: Payer: Self-pay | Admitting: Family

## 2022-08-22 NOTE — Telephone Encounter (Signed)
Sent message to patient to let know sending for review and to get more information.

## 2022-08-22 NOTE — Telephone Encounter (Signed)
Would need office as we will likely need to try to obtain culture of the discharge.

## 2022-09-27 ENCOUNTER — Ambulatory Visit: Payer: Federal, State, Local not specified - PPO | Admitting: Family Medicine

## 2022-09-27 ENCOUNTER — Encounter: Payer: Self-pay | Admitting: Family Medicine

## 2022-09-27 VITALS — BP 130/78 | HR 79 | Temp 98.2°F | Ht 61.2 in | Wt 187.2 lb

## 2022-09-27 DIAGNOSIS — E1169 Type 2 diabetes mellitus with other specified complication: Secondary | ICD-10-CM | POA: Diagnosis not present

## 2022-09-27 DIAGNOSIS — Z1211 Encounter for screening for malignant neoplasm of colon: Secondary | ICD-10-CM

## 2022-09-27 DIAGNOSIS — Z7985 Long-term (current) use of injectable non-insulin antidiabetic drugs: Secondary | ICD-10-CM

## 2022-09-27 DIAGNOSIS — R399 Unspecified symptoms and signs involving the genitourinary system: Secondary | ICD-10-CM | POA: Diagnosis not present

## 2022-09-27 DIAGNOSIS — I152 Hypertension secondary to endocrine disorders: Secondary | ICD-10-CM

## 2022-09-27 DIAGNOSIS — K7460 Unspecified cirrhosis of liver: Secondary | ICD-10-CM

## 2022-09-27 DIAGNOSIS — E1159 Type 2 diabetes mellitus with other circulatory complications: Secondary | ICD-10-CM

## 2022-09-27 DIAGNOSIS — E538 Deficiency of other specified B group vitamins: Secondary | ICD-10-CM

## 2022-09-27 DIAGNOSIS — M3119 Other thrombotic microangiopathy: Secondary | ICD-10-CM

## 2022-09-27 DIAGNOSIS — E559 Vitamin D deficiency, unspecified: Secondary | ICD-10-CM

## 2022-09-27 DIAGNOSIS — E118 Type 2 diabetes mellitus with unspecified complications: Secondary | ICD-10-CM | POA: Diagnosis not present

## 2022-09-27 DIAGNOSIS — D696 Thrombocytopenia, unspecified: Secondary | ICD-10-CM

## 2022-09-27 DIAGNOSIS — E785 Hyperlipidemia, unspecified: Secondary | ICD-10-CM

## 2022-09-27 LAB — POCT URINALYSIS DIPSTICK (MANUAL)
Leukocytes, UA: NEGATIVE
Nitrite, UA: NEGATIVE
Poct Bilirubin: NEGATIVE
Poct Blood: NEGATIVE
Poct Glucose: NORMAL mg/dL
Poct Ketones: NEGATIVE
Poct Protein: NEGATIVE mg/dL
Poct Urobilinogen: NORMAL mg/dL
Spec Grav, UA: 1.02 (ref 1.010–1.025)
pH, UA: 7.5 (ref 5.0–8.0)

## 2022-09-27 NOTE — Progress Notes (Signed)
SUBJECTIVE:   Chief Complaint  Patient presents with   Transitions Of Care   HPI Presents to clinic to transfer care  Concern for foul smelling urine and urinary frequency.  Symptoms started yesterday.  Denies any fevers, abdominal pain, dysuria, vaginal bleeding, hematuria.  Endorses some mild back pain.    Hyperlipidemia Prescribed Lipitor 20 mg daily.  Has not been taking medication as she felt was not needed due to having lost weight.   Peripheral Neuropathy Prescribed Gabapentin 100-300 mg at bedtime.  Patient reports this has not been helping and self discontinued medication.    DM Type 2 Asymptomatic.  Last A1c 6.8.  Currently on Trulicity 075 mg weekly.  Would like to change medication.  Does not like injectable. Not on statin, ACEi/ARB.    PERTINENT PMH / PSH: DM Type 2 HLD Hepatic Steatosis ADAMTS13 deficiency TTP  OBJECTIVE:  BP 130/78 (BP Location: Left Arm, Patient Position: Sitting, Cuff Size: Normal)   Pulse 79   Temp 98.2 F (36.8 C) (Oral)   Ht 5' 1.2" (1.554 m)   Wt 187 lb 3.2 oz (84.9 kg)   LMP 04/30/2012 Comment: not sexually active  SpO2 97%   BMI 35.14 kg/m    Physical Exam Vitals reviewed.  Constitutional:      General: She is not in acute distress.    Appearance: She is obese. She is not ill-appearing.  HENT:     Head: Normocephalic.     Right Ear: Tympanic membrane, ear canal and external ear normal.     Left Ear: Tympanic membrane, ear canal and external ear normal.  Eyes:     Conjunctiva/sclera: Conjunctivae normal.  Neck:     Thyroid: No thyromegaly or thyroid tenderness.  Cardiovascular:     Rate and Rhythm: Normal rate and regular rhythm.     Pulses: Normal pulses.  Pulmonary:     Effort: Pulmonary effort is normal.     Breath sounds: Normal breath sounds.  Abdominal:     General: Bowel sounds are normal.  Neurological:     Mental Status: She is alert. Mental status is at baseline.  Psychiatric:        Mood and Affect:  Mood normal.        Behavior: Behavior normal.        Thought Content: Thought content normal.        Judgment: Judgment normal.        09/27/2022    3:11 PM 03/09/2022    3:16 PM 12/21/2021    2:28 PM 06/18/2021    1:02 PM 06/17/2020   11:25 AM  Depression screen PHQ 2/9  Decreased Interest 1 1 0 0 1  Down, Depressed, Hopeless 1 0 0 0 0  PHQ - 2 Score 2 1 0 0 1  Altered sleeping 1 2 0  3  Tired, decreased energy 1 1 0  1  Change in appetite 2 0 0  1  Feeling bad or failure about yourself  1 0 0  0  Trouble concentrating 0 0 0  1  Moving slowly or fidgety/restless 0 0 0  1  Suicidal thoughts 0 0 0  0  PHQ-9 Score 7 4 0  8  Difficult doing work/chores Not difficult at all Not difficult at all Not difficult at all  Somewhat difficult      09/27/2022    3:11 PM 03/09/2022    3:17 PM  GAD 7 : Generalized Anxiety Score  Nervous, Anxious,  on Edge 1 1  Control/stop worrying 0 0  Worry too much - different things 1 0  Trouble relaxing 0 0  Restless 0 0  Easily annoyed or irritable 1 1  Afraid - awful might happen 0 1  Total GAD 7 Score 3 3  Anxiety Difficulty Not difficult at all Not difficult at all    ASSESSMENT/PLAN:  Type 2 diabetes mellitus with complications [facility] Assessment & Plan: Chronic.  Asymptomatic Recent A1c 6.8 Continue Trulicity 0.75 mg weekly Consider switching to Jardiance in future if not previously tried given that patient would like to stop injectable. 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  She would like labs checked first and will decide once results reviewed Recommend Crestor 10 mg daily, if remains elevated Check fasting lipids  Orders: -     Lipid panel; Future  Hypertension associated with diabetes [facility]) Assessment & Plan: Chronic Well controlled.  Not currently on medications.   Continue to monitor.  If needing antihypertensive would recommend ARB in future Check Cmet  Orders: -     Comprehensive metabolic panel; Future  Morbid obesity (HCC) Assessment & Plan: BMI elevated with DM T2, HTN, HLD Check labs Encouraged healthy lifestyle and increase in activity  Orders: -     CBC with Differential/Platelet; Future -     TSH; Future  UTI symptoms Assessment & Plan: Urinary frequency and foul smelling urine POC urine negative Increase H2O intake Likely symptom of DM, will check A1c and serum glucose  Orders: -     Urinalysis, Routine w reflex microscopic; Future -     Urine Culture; Future -     POCT Urinalysis Dip Manual  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  Dana Allan, MD

## 2022-09-27 NOTE — Patient Instructions (Addendum)
It was a pleasure meeting you today. Thank you for allowing me to take part in your health care.  Our goals for today as we discussed include:  Schedule lab appointment.  Fast for 10 hours  Continue current medications for now and will make changes as discussed once labs reviewed.  Follow up in 2-3 weeks.  Will call to schedule appointment  Colonoscopy referral sent  Recommend Tetanus Vaccination.  This is given every 10 years.    If you have any questions or concerns, please do not hesitate to call the office at [phone].  I look forward to our next visit and until then take care and stay safe.  Regards,   Dana Allan, MD   [facility]

## 2022-09-28 LAB — URINALYSIS, ROUTINE W REFLEX MICROSCOPIC
Bilirubin Urine: NEGATIVE
Hgb urine dipstick: NEGATIVE
Ketones, ur: NEGATIVE
Leukocytes,Ua: NEGATIVE
Nitrite: NEGATIVE
RBC / HPF: NONE SEEN (ref 0–?)
Specific Gravity, Urine: 1.015 (ref 1.000–1.030)
Total Protein, Urine: NEGATIVE
Urine Glucose: NEGATIVE
Urobilinogen, UA: 0.2 (ref 0.0–1.0)
WBC, UA: NONE SEEN (ref 0–?)
pH: 7.5 (ref 5.0–8.0)

## 2022-09-28 LAB — URINE CULTURE
MICRO NUMBER:: 15235458
SPECIMEN QUALITY:: ADEQUATE

## 2022-09-30 ENCOUNTER — Other Ambulatory Visit: Payer: Federal, State, Local not specified - PPO

## 2022-09-30 DIAGNOSIS — E118 Type 2 diabetes mellitus with unspecified complications: Secondary | ICD-10-CM | POA: Diagnosis not present

## 2022-09-30 DIAGNOSIS — D696 Thrombocytopenia, unspecified: Secondary | ICD-10-CM

## 2022-09-30 DIAGNOSIS — E559 Vitamin D deficiency, unspecified: Secondary | ICD-10-CM | POA: Diagnosis not present

## 2022-09-30 DIAGNOSIS — E1169 Type 2 diabetes mellitus with other specified complication: Secondary | ICD-10-CM | POA: Diagnosis not present

## 2022-09-30 DIAGNOSIS — E785 Hyperlipidemia, unspecified: Secondary | ICD-10-CM

## 2022-09-30 DIAGNOSIS — E538 Deficiency of other specified B group vitamins: Secondary | ICD-10-CM | POA: Diagnosis not present

## 2022-09-30 DIAGNOSIS — I152 Hypertension secondary to endocrine disorders: Secondary | ICD-10-CM

## 2022-09-30 DIAGNOSIS — E1159 Type 2 diabetes mellitus with other circulatory complications: Secondary | ICD-10-CM | POA: Diagnosis not present

## 2022-09-30 LAB — CBC WITH DIFFERENTIAL/PLATELET
Basophils Absolute: 0 10*3/uL (ref 0.0–0.1)
Basophils Relative: 0.6 % (ref 0.0–3.0)
Eosinophils Absolute: 0.2 10*3/uL (ref 0.0–0.7)
Eosinophils Relative: 4.8 % (ref 0.0–5.0)
HCT: 40.4 % (ref 36.0–46.0)
Hemoglobin: 13.4 g/dL (ref 12.0–15.0)
Lymphocytes Relative: 33.7 % (ref 12.0–46.0)
Lymphs Abs: 1.2 10*3/uL (ref 0.7–4.0)
MCHC: 33.1 g/dL (ref 30.0–36.0)
MCV: 90.5 fl (ref 78.0–100.0)
Monocytes Absolute: 0.3 10*3/uL (ref 0.1–1.0)
Monocytes Relative: 7.9 % (ref 3.0–12.0)
Neutro Abs: 1.9 10*3/uL (ref 1.4–7.7)
Neutrophils Relative %: 53 % (ref 43.0–77.0)
Platelets: 135 10*3/uL — ABNORMAL LOW (ref 150.0–400.0)
RBC: 4.46 Mil/uL (ref 3.87–5.11)
RDW: 13.2 % (ref 11.5–15.5)
WBC: 3.6 10*3/uL — ABNORMAL LOW (ref 4.0–10.5)

## 2022-09-30 LAB — HEMOGLOBIN A1C: Hgb A1c MFr Bld: 6.7 % — ABNORMAL HIGH (ref 4.6–6.5)

## 2022-09-30 LAB — COMPREHENSIVE METABOLIC PANEL
ALT: 14 U/L (ref 0–35)
AST: 21 U/L (ref 0–37)
Albumin: 4.2 g/dL (ref 3.5–5.2)
Alkaline Phosphatase: 103 U/L (ref 39–117)
BUN: 9 mg/dL (ref 6–23)
CO2: 28 mEq/L (ref 19–32)
Calcium: 10.1 mg/dL (ref 8.4–10.5)
Chloride: 105 mEq/L (ref 96–112)
Creatinine, Ser: 0.82 mg/dL (ref 0.40–1.20)
GFR: 76.32 mL/min (ref >60.00)
Glucose, Bld: 145 mg/dL — ABNORMAL HIGH (ref 70–99)
Potassium: 4.1 mEq/L (ref 3.5–5.1)
Sodium: 141 mEq/L (ref 135–145)
Total Bilirubin: 0.9 mg/dL (ref 0.2–1.2)
Total Protein: 7.1 g/dL (ref 6.0–8.3)

## 2022-09-30 LAB — VITAMIN D 25 HYDROXY (VIT D DEFICIENCY, FRACTURES): VITD: 47.73 ng/mL (ref 30.00–100.00)

## 2022-09-30 LAB — TSH: TSH: 1.87 u[IU]/mL (ref 0.35–5.50)

## 2022-09-30 LAB — LIPID PANEL
Cholesterol: 221 mg/dL — ABNORMAL HIGH (ref 0–200)
HDL: 78.2 mg/dL (ref >39.00)
LDL Cholesterol: 130 mg/dL — ABNORMAL HIGH (ref 0–99)
NonHDL: 142.45
Total CHOL/HDL Ratio: 3
Triglycerides: 61 mg/dL (ref 0.0–149.0)
VLDL: 12.2 mg/dL (ref 0.0–40.0)

## 2022-09-30 LAB — VITAMIN B12: Vitamin B-12: >1501 pg/mL — ABNORMAL HIGH (ref 211–911)

## 2022-10-04 ENCOUNTER — Other Ambulatory Visit: Payer: Self-pay | Admitting: Family

## 2022-10-04 DIAGNOSIS — E119 Type 2 diabetes mellitus without complications: Secondary | ICD-10-CM

## 2022-10-05 ENCOUNTER — Other Ambulatory Visit (HOSPITAL_COMMUNITY): Payer: Self-pay

## 2022-10-10 ENCOUNTER — Encounter: Payer: Self-pay | Admitting: Family Medicine

## 2022-10-10 DIAGNOSIS — E118 Type 2 diabetes mellitus with unspecified complications: Secondary | ICD-10-CM | POA: Insufficient documentation

## 2022-10-10 DIAGNOSIS — R399 Unspecified symptoms and signs involving the genitourinary system: Secondary | ICD-10-CM | POA: Insufficient documentation

## 2022-10-10 DIAGNOSIS — E1169 Type 2 diabetes mellitus with other specified complication: Secondary | ICD-10-CM | POA: Insufficient documentation

## 2022-10-10 DIAGNOSIS — I152 Hypertension secondary to endocrine disorders: Secondary | ICD-10-CM | POA: Insufficient documentation

## 2022-10-10 DIAGNOSIS — Z1211 Encounter for screening for malignant neoplasm of colon: Secondary | ICD-10-CM | POA: Insufficient documentation

## 2022-10-10 NOTE — Assessment & Plan Note (Signed)
Chronic Not taking prescribed statin Discussed that given her history of DM T2, recommendations to continue statin therapy.  She would like labs checked first and will decide once results reviewed Recommend Crestor 10 mg daily, if remains elevated Check fasting lipids

## 2022-10-10 NOTE — Assessment & Plan Note (Signed)
BMI elevated with DM T2, HTN, HLD Check labs Encouraged healthy lifestyle and increase in activity

## 2022-10-10 NOTE — Assessment & Plan Note (Signed)
Chronic.  Asymptomatic Recent A1c 6.8 Continue Trulicity 0.75 mg weekly Consider switching to Jardiance in future if not previously tried given that patient would like to stop injectable. Recommend statin, ARB in future Recommend annual eye exam Foot exam at next visit UACR, A1c, Cmet today Follow up in 2-4 weeks

## 2022-10-10 NOTE — Assessment & Plan Note (Signed)
Chronic Check CBC Follows with Oncology, Dr Trey Sailors

## 2022-10-10 NOTE — Assessment & Plan Note (Signed)
Chronic Well controlled.  Not currently on medications.   Continue to monitor.  If needing antihypertensive would recommend ARB in future Check Cmet

## 2022-10-10 NOTE — Assessment & Plan Note (Signed)
Urinary frequency and foul smelling urine POC urine negative Increase H2O intake Likely symptom of DM, will check A1c and serum glucose

## 2022-10-12 ENCOUNTER — Other Ambulatory Visit: Payer: Self-pay | Admitting: Family

## 2022-10-12 DIAGNOSIS — E119 Type 2 diabetes mellitus without complications: Secondary | ICD-10-CM

## 2022-10-13 ENCOUNTER — Other Ambulatory Visit: Payer: Self-pay | Admitting: Family Medicine

## 2022-10-13 ENCOUNTER — Telehealth: Payer: Self-pay

## 2022-10-13 DIAGNOSIS — L732 Hidradenitis suppurativa: Secondary | ICD-10-CM

## 2022-10-13 MED ORDER — DOXYCYCLINE HYCLATE 100 MG PO TABS
100.0000 mg | ORAL_TABLET | Freq: Two times a day (BID) | ORAL | 0 refills | Status: AC
Start: 2022-10-13 — End: 2022-10-23

## 2022-10-13 NOTE — Telephone Encounter (Signed)
Prescription for Doxycycline sent for HS Patient will need appointment for future flare up  Please advise patient to take Probiotics daily and continue for 14 days after treatment.  Dana Allan, MD

## 2022-10-13 NOTE — Telephone Encounter (Signed)
Patient states she had another boil on her left upper inner thigh.  Patient states it has burst and left an open wound.  Patient states bloody puss came out of it but it is not on-going. Patient states she put some polysporin ointment on it and covered it with a bandaid.  Patient states she would like to know if she can have an antibiotic.  Patient states this is her third round with the boil, so she would like to know if Dr. Dana Allan can give her an antibiotic or whatever she thinks is best for it without having another office visit.  Patient states her preferred pharmacy is [facility] on [address] [place].

## 2022-10-14 NOTE — Telephone Encounter (Signed)
Left message to return call to our office.  

## 2022-10-14 NOTE — Telephone Encounter (Signed)
Patient returned office call and note from Dr Clent Ridges was read to patient.

## 2022-10-24 ENCOUNTER — Ambulatory Visit: Payer: Federal, State, Local not specified - PPO | Admitting: Family Medicine

## 2022-10-24 VITALS — BP 138/74 | HR 80 | Temp 97.8°F | Resp 16 | Ht 61.2 in | Wt 184.4 lb

## 2022-10-24 DIAGNOSIS — M3119 Other thrombotic microangiopathy: Secondary | ICD-10-CM | POA: Diagnosis not present

## 2022-10-24 DIAGNOSIS — E1169 Type 2 diabetes mellitus with other specified complication: Secondary | ICD-10-CM

## 2022-10-24 DIAGNOSIS — Z7985 Long-term (current) use of injectable non-insulin antidiabetic drugs: Secondary | ICD-10-CM

## 2022-10-24 DIAGNOSIS — E785 Hyperlipidemia, unspecified: Secondary | ICD-10-CM

## 2022-10-24 DIAGNOSIS — E1159 Type 2 diabetes mellitus with other circulatory complications: Secondary | ICD-10-CM

## 2022-10-24 DIAGNOSIS — E118 Type 2 diabetes mellitus with unspecified complications: Secondary | ICD-10-CM

## 2022-10-24 DIAGNOSIS — I152 Hypertension secondary to endocrine disorders: Secondary | ICD-10-CM

## 2022-10-24 NOTE — Assessment & Plan Note (Signed)
Chronic.  Asymptomatic Recent A1c 6.7 Continue Trulicity 0.75 mg weekly Declined statin, ARB in future Recommend annual eye exam Foot exam at next visit

## 2022-10-24 NOTE — Patient Instructions (Signed)
It was a pleasure meeting you today. Thank you for allowing me to take part in your health care.  Our goals for today as we discussed include:  Continue current dose of Trulicity  Recommend statin for cholesterol Can try Fish oil daily and increase activity Limit sodium intake  Use compression stockings  Stop Vitamin B supplements   Follow up with Hematology for low platelets  Follow up in 6 months or as needed   If you have any questions or concerns, please do not hesitate to call the office at [phone].  I look forward to our next visit and until then take care and stay safe.  Regards,   Dana Allan, MD   [facility]

## 2022-10-24 NOTE — Assessment & Plan Note (Signed)
Chronic. Stable Follows with Oncology, Dr Trey Sailors

## 2022-10-24 NOTE — Assessment & Plan Note (Signed)
Chronic. Asymptomatic.  Goal less than 140/90 per JNC 8 guidelines.  Not currently on medications.   Continue to monitor.  If needing antihypertensive would recommend ARB in future

## 2022-10-24 NOTE — Assessment & Plan Note (Addendum)
Chronic.  LDL not at goal <70 Declined statin Plans to increase activity, monitor diet and start Fish oil tablets

## 2022-10-24 NOTE — Progress Notes (Signed)
SUBJECTIVE:   Chief Complaint  Patient presents with   Abnormal Lab    Wants to talk about lab results.   HPI Presents to clinic to transfer care  Concern for foul smelling urine and urinary frequency.  Symptoms started yesterday.  Denies any fevers, abdominal pain, dysuria, vaginal bleeding, hematuria.  Endorses some mild back pain.    Hyperlipidemia Previously prescribed Lipitor 20 mg daily but self discontinued.  Recent LDL 130.  Not interested in restarting medication at this time and wants to try to manage with diet and increasing activity. Wants to start taking Fish oil daily. Endorses eating mostly fried foods,.  DM Type 2 Asymptomatic.  Last A1c 6.8.  Currently on Trulicity 075 mg weekly.  Initially wanted to change to different medication due to not wanting to self inject however does not want to take daily pills.  Ok with continuing current medication and wants to stay at same dose. Not on statin, ACEi/ARB.     TTP  Recent CBC and Platelets low. Asymptomatic.  Has follow up with hematology in near future.  PERTINENT PMH / PSH: DM Type 2 HLD Hepatic Steatosis ADAMTS13 deficiency TTP  OBJECTIVE:  BP 138/74   Pulse 80   Temp 97.8 F (36.6 C)   Resp 16   Ht 5' 1.2" (1.554 m)   Wt 184 lb 6 oz (83.6 kg)   LMP 04/30/2012 Comment: not sexually active  SpO2 97%   BMI 34.61 kg/m    Physical Exam Constitutional:      General: She is not in acute distress.    Appearance: She is normal weight. She is not ill-appearing.  HENT:     Head: Normocephalic.     Right Ear: Tympanic membrane, ear canal and external ear normal.     Left Ear: Tympanic membrane, ear canal and external ear normal.  Eyes:     Conjunctiva/sclera: Conjunctivae normal.  Neck:     Thyroid: No thyromegaly or thyroid tenderness.  Cardiovascular:     Rate and Rhythm: Normal rate and regular rhythm.     Pulses: Normal pulses.     Heart sounds: Normal heart sounds.  Pulmonary:     Effort:  Pulmonary effort is normal.  Abdominal:     General: Bowel sounds are normal.     Palpations: Abdomen is soft.  Neurological:     Mental Status: She is alert. Mental status is at baseline.  Psychiatric:        Mood and Affect: Mood normal.        Behavior: Behavior normal.        Thought Content: Thought content normal.        Judgment: Judgment normal.        10/24/2022    8:55 AM 09/27/2022    3:11 PM 03/09/2022    3:16 PM 12/21/2021    2:28 PM 06/18/2021    1:02 PM  Depression screen PHQ 2/9  Decreased Interest 0 1 1 0 0  Down, Depressed, Hopeless 0 1 0 0 0  PHQ - 2 Score 0 2 1 0 0  Altered sleeping 2 1 2  0   Tired, decreased energy 3 1 1  0   Change in appetite 0 2 0 0   Feeling bad or failure about yourself  0 1 0 0   Trouble concentrating 0 0 0 0   Moving slowly or fidgety/restless 0 0 0 0   Suicidal thoughts 0 0 0 0   PHQ-9 Score  5 7 4  0   Difficult doing work/chores Somewhat difficult Not difficult at all Not difficult at all Not difficult at all       10/24/2022    8:55 AM 09/27/2022    3:11 PM 03/09/2022    3:17 PM  GAD 7 : Generalized Anxiety Score  Nervous, Anxious, on Edge 1 1 1   Control/stop worrying 0 0 0  Worry too much - different things 1 1 0  Trouble relaxing 0 0 0  Restless 0 0 0  Easily annoyed or irritable 1 1 1   Afraid - awful might happen 1 0 1  Total GAD 7 Score 4 3 3   Anxiety Difficulty Not difficult at all Not difficult at all Not difficult at all    ASSESSMENT/PLAN:  Type 2 diabetes mellitus with complications [facility]) Assessment & Plan: Chronic.  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 Stable Follows with Oncology, Dr Trey Sailors   Hypertension associated with diabetes [facility]) Assessment & Plan: Chronic.  Asymptomatic.  Goal less than 140/90 per JNC 8 guidelines.  Not currently on medications.   Continue to monitor.  If needing antihypertensive would recommend ARB in future    Hyperlipidemia associated with type 2 diabetes mellitus (HCC) Assessment & Plan: Chronic.  LDL not at goal <70 Declined statin Plans to increase activity, monitor diet and start Fish oil tablets     PDMP reviewed  Return in about 6 months (around 04/26/2023) for PCP.  Dana Allan, MD

## 2022-10-25 ENCOUNTER — Encounter (HOSPITAL_COMMUNITY): Payer: Self-pay

## 2022-10-25 ENCOUNTER — Telehealth (HOSPITAL_COMMUNITY): Payer: Self-pay | Admitting: *Deleted

## 2022-10-25 ENCOUNTER — Ambulatory Visit (INDEPENDENT_AMBULATORY_CARE_PROVIDER_SITE_OTHER): Payer: Federal, State, Local not specified - PPO

## 2022-10-25 ENCOUNTER — Ambulatory Visit (HOSPITAL_COMMUNITY)
Admission: EM | Admit: 2022-10-25 | Discharge: 2022-10-25 | Disposition: A | Discharge status: Home / Self Care | Payer: Federal, State, Local not specified - PPO | Attending: Emergency Medicine | Admitting: Emergency Medicine

## 2022-10-25 ENCOUNTER — Encounter: Payer: Self-pay | Admitting: Family Medicine

## 2022-10-25 DIAGNOSIS — Z20822 Contact with and (suspected) exposure to covid-19: Secondary | ICD-10-CM

## 2022-10-25 DIAGNOSIS — R509 Fever, unspecified: Secondary | ICD-10-CM | POA: Diagnosis not present

## 2022-10-25 DIAGNOSIS — R0600 Dyspnea, unspecified: Secondary | ICD-10-CM | POA: Diagnosis not present

## 2022-10-25 DIAGNOSIS — J0140 Acute pansinusitis, unspecified: Secondary | ICD-10-CM | POA: Diagnosis not present

## 2022-10-25 LAB — POCT URINALYSIS DIP (MANUAL ENTRY)
Bilirubin, UA: NEGATIVE
Blood, UA: NEGATIVE
Glucose, UA: NEGATIVE mg/dL
Ketones, POC UA: NEGATIVE mg/dL
Leukocytes, UA: NEGATIVE
Nitrite, UA: NEGATIVE
Protein Ur, POC: NEGATIVE mg/dL
Spec Grav, UA: 1.01 (ref 1.010–1.025)
Urobilinogen, UA: 0.2 E.U./dL
pH, UA: 7 (ref 5.0–8.0)

## 2022-10-25 LAB — MICROALBUMIN / CREATININE URINE RATIO
Creatinine,U: 153.1 mg/dL
Microalb Creat Ratio: 0.6 mg/g (ref 0.0–30.0)
Microalb, Ur: 0.9 mg/dL (ref 0.0–1.9)

## 2022-10-25 MED ORDER — ACETAMINOPHEN 325 MG PO TABS
975.0000 mg | ORAL_TABLET | Freq: Once | ORAL | Status: AC
  Administered 2022-10-25: 975 mg via ORAL

## 2022-10-25 MED ORDER — AMOXICILLIN-POT CLAVULANATE 875-125 MG PO TABS: 1.0000 | ORAL_TABLET | Freq: Two times a day (BID) | ORAL | 0 refills | Status: DC

## 2022-10-25 MED ORDER — ACETAMINOPHEN 325 MG PO TABS
ORAL_TABLET | ORAL | Status: AC
  Filled 2022-10-25: qty 3

## 2022-10-25 NOTE — Discharge Instructions (Addendum)
I have sent off a COVID test.  It will be back in 6 to 24 hours.  If COVID is positive, you qualify for Paxlovid if you want it.  You are at increased risk for progression to severe illness with  diabetes, hypertension, and your unvaccinated status.  Your x-ray is normal, your EKG does not show any signs of ischemia, your urinalysis is negative for UTI.  COVID is negative, I suspect that she may have a sinusitis causing your symptoms.  I am going to send you home with Augmentin for 7 days.  Start this only if your COVID is negative.  Saline nasal irrigation with a Lloyd Huger Med rinse and distilled water as often as you want, Mucinex.  You can take Tylenol 1000 mg 3 times a day for fever.

## 2022-10-25 NOTE — ED Triage Notes (Signed)
Pt started with fever 102 and sob this afternoon. Pt reports some palpation and chest pain that started this afternoon.Pt has not taken anything to help with her fever.

## 2022-10-25 NOTE — ED Provider Notes (Signed)
HPI  SUBJECTIVE:  Vicki [name] is a 62 y.o. female who presents with fevers Tmax 102.1, chills, headaches, nasal congestion, sinus pain and pressure, rhinorrhea, cough productive of phlegm starting this morning.  She reports intermittent, seconds long low midline abdominal pain described as an ache with cloudy and odorous urine.  No body aches, facial swelling, upper dental pain, sore throat, postnasal drip, wheezing, shortness of breath, chest pain, dyspnea on exertion.  No dysuria, change in her baseline urgency, frequency, hematuria.  She finished 10 days of doxycycline yesterday for boils in her groin which have largely healed.  No vaginal odor, bleeding, discharge.  She has not been sexually active in 5 months.  No known COVID exposure.  She did not get the COVID-vaccine.  She has not tried anything for symptoms.  No aggravating or alleviating factors.  She has a past medical history of thrombocytopenia, diabetes, hypertension and NASH.  No history of UTI, pyelonephritis, nephrolithiasis, PID, STDs, chronic kidney disease.  PCP: [facility].    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    Past Surgical History:  Procedure Laterality Date   BREAST CYST ASPIRATION Left    COLONOSCOPY  2014   ECTOPIC PREGNANCY SURGERY  1990   IR FLUORO GUIDE CV LINE RIGHT  03/05/2019   IR FLUORO GUIDE CV LINE RIGHT  03/15/2019   IR FLUORO GUIDE CV LINE RIGHT  05/17/2019   IR FLUORO GUIDE CV LINE RIGHT  05/23/2019   IR REMOVAL TUN CV CATH W/O FL  04/12/2019   IR REMOVAL TUN CV CATH  W/O FL  08/06/2019   IR US GUIDE VASC ACCESS RIGHT  03/05/2019   IR US GUIDE VASC ACCESS RIGHT  03/15/2019   IR US GUIDE VASC ACCESS RIGHT  05/17/2019   TONSILLECTOMY  1980   UPPER GI ENDOSCOPY  08/2017   [facility]    Family History  Problem Relation Age of Onset   Hypertension Mother    Diabetes Mother    Heart attack Mother 46   Asthma Mother        Childhood   Arthritis Mother    Heart disease Mother    Hyperlipidemia Mother    Hypertension Sister    Diabetes Sister    Asthma Sister 40       asthma attack cause of death   Diabetes Brother    Hypertension Brother    Diabetes Brother    Hypertension Brother    Hypertension Maternal Grandmother    Heart attack Maternal Grandfather 75   Colon cancer Neg Hx     Social History   Tobacco Use   Smoking status: Never   Smokeless tobacco: Never  Vaping Use   Vaping status: Never Used  Substance Use Topics   Alcohol use: Yes    Comment: 2 a month   Drug use: No    No current facility-administered medications for this encounter.  Current Outpatient Medications:    acetaminophen (TYLENOL) 325  MG tablet, Take 650 mg by mouth every 6 (six) hours as needed., Disp: , Rfl:    Alum Hydroxide-Mag Carbonate (GAVISCON EXTRA STRENGTH PO), Take 1 tablet by mouth 4 (four) times daily as needed., Disp: , Rfl:    Ascorbic Acid (VITAMIN C) 500 MG CAPS, Take by mouth., Disp: , Rfl:    cholecalciferol (VITAMIN D3) 25 MCG (1000 UT) tablet, Take 1,000 Units by mouth daily., Disp: , Rfl:    fexofenadine (ALLEGRA) 180 MG tablet, Take 180 mg by mouth daily as needed for allergies or rhinitis., Disp: , Rfl:    folic acid (FOLVITE) 1 MG tablet, Take 2 tablets by mouth once daily, Disp: 60 tablet, Rfl: 0   gabapentin (NEURONTIN) 100 MG capsule, Take 1-3 capsules (100-300 mg total) by mouth at bedtime as needed. (Patient not taking: Reported on 10/24/2022), Disp: 90 capsule, Rfl: 1   Lancets (ONETOUCH ULTRASOFT) lancets, 1 each by Other  route daily at 12 noon. Use to check Glucose once daily as directed., Disp: 100 each, Rfl: 1   Milk Thistle 1000 MG CAPS, Take 1 capsule by mouth daily., Disp: , Rfl:    mometasone (ELOCON) 0.1 % cream, APPLY  CREAM TOPICALLY ONCE DAILY, Disp: 45 g, Rfl: 0   Multiple Vitamin (MULTIVITAMIN PO), Take by mouth., Disp: , Rfl:    Multiple Vitamins-Minerals (ZINC PO), Take by mouth., Disp: , Rfl:    ONETOUCH ULTRA test strip, USE AS INSTRUCTED BY CHECKING BLOOD SUGAR 1-2 TIMES DAILY, Disp: 100 each, Rfl: 0   TRULICITY 0.75 MG/0.5ML SOPN, INJECT 0.75 MG SUBCUTANEOUSLY ONCE A WEEK, Disp: 12 mL, Rfl: 0   vitamin B-12 (CYANOCOBALAMIN) 500 MCG tablet, Take 500 mcg by mouth daily., Disp: , Rfl:   Allergies  Allergen Reactions   Metformin And Related Itching    Heart palpitation   Sulfa Antibiotics Itching and Other (See Comments)     ROS  As noted in HPI.   Physical Exam  BP (!) 156/87   Pulse (!) 109   Temp (!) 101.4 F (38.6 C) (Oral)   Resp 16   LMP 04/30/2012 Comment: not sexually active  SpO2 96%   Constitutional: Well developed, well nourished, no acute distress Eyes:  EOMI, conjunctiva normal bilaterally HENT: Normocephalic, atraumatic,mucus membranes moist.  Positive nasal congestion.  Normal turbinates.  No maxillary, frontal sinus tenderness.  Tonsils surgically absent.  Normal oropharynx. Respiratory: Normal inspiratory effort, lungs clear bilaterally Cardiovascular: Regular tachycardia, no murmurs rubs or gallops GI: nondistended, soft.  Positive suprapubic and right flank tenderness.  No guarding, rebound, active bowel sounds.  Negative Murphy, negative McBurney.  No other abdominal tenderness. Neck: No CVAT skin: Healed boils in groin.[name]  No erythema, induration, expressible purulent drainage Musculoskeletal: no deformities Neurologic: Alert & oriented x 3, no focal neuro deficits Psychiatric: Speech and behavior appropriate   ED Course   Medications  acetaminophen  (TYLENOL) tablet 975 mg (975 mg Oral Given 10/25/22 2044)    Orders Placed This Encounter  Procedures   SARS CORONAVIRUS 2 (TAT 6-24 HRS) Anterior Nasal Swab    Standing Status:   Standing    Number of Occurrences:   1   DG Chest 2 View    Standing Status:   Standing    Number of Occurrences:   1    Order Specific Question:   Reason for Exam (SYMPTOM  OR DIAGNOSIS REQUIRED)    Answer:   fever SOB r/o PNA   POC urinalysis dipstick    Standing  Status:   Standing    Number of Occurrences:   1   EKG 12-Lead    Standing Status:   Standing    Number of Occurrences:   1   ED EKG    Standing Status:   Standing    Number of Occurrences:   1    Order Specific Question:   Reason for Exam    Answer:   Chest Pain    Order Specific Question:   Release to patient    Answer:   Immediate    Results for orders placed or performed during the hospital encounter of 10/25/22 (from the past 24 hour(s))  POC urinalysis dipstick     Status: Abnormal   Collection Time: 10/25/22  9:17 PM  Result Value Ref Range   Color, UA light yellow (A) yellow   Clarity, UA clear clear   Glucose, UA negative negative mg/dL   Bilirubin, UA negative negative   Ketones, POC UA negative negative mg/dL   Spec Grav, UA 1.610 9.604 - 1.025   Blood, UA negative negative   pH, UA 7.0 5.0 - 8.0   Protein Ur, POC negative negative mg/dL   Urobilinogen, UA 0.2 0.2 or 1.0 E.U./dL   Nitrite, UA Negative Negative   Leukocytes, UA Negative Negative   DG Chest 2 View  Result Date: 10/25/2022 CLINICAL DATA:  Dyspnea EXAM: CHEST - 2 VIEW COMPARISON:  None Available. FINDINGS: The heart size and mediastinal contours are within normal limits. Both lungs are clear. The visualized skeletal structures are unremarkable. IMPRESSION: No active cardiopulmonary disease. Electronically Signed   By: Helyn Numbers M.D.   On: 10/25/2022 21:18    EKG: Sinus tachycardia, rate 103.  Left axis deviation.  Normal intervals.  No hypertropohy.  No  new ST-T wave changes compared to EKG from 09/2021.  ED Clinical Impression  1. Fever, unspecified fever cause   2. Acute non-recurrent pansinusitis   3. Encounter for laboratory testing for COVID-19 virus      ED Assessment/Plan    Patient presents with acute illness with systemic symptoms of fever and tachycardia.    Patient to me denies chest pain, shortness of breath.  She reports to me some nasal congestion, sinus pain and pressure, rhinorrhea, intermittent seconds long low abdominal pain with cloudy and odorous urine.  UA negative for urinary tract infection.  No hematuria.   Reviewed imaging independently.  Normal chest x-ray.  See radiology report for full details.  EKG: Sinus tachycardia, rate 103.  Left axis deviation.  Normal intervals.  No hypertrophy.  No ST-T wave changes compared to EKG from 09/2021.  GFR from labs done on 09/30/2022 76.32 mL/minute  Repeat vitals, fever and heart rate trending down.  Heart rate 95, temperature 101.  She appears nontoxic.  Patient's abdomen is benign.  I suspect that she has COVID.  However, with a nasal congestion, sinus pain and pressure, fever of 102, she could have a bacterial sinus infection.  Will send home with Augmentin for 7 days.  Saline nasal irrigation, Mucinex.  Tylenol 1000 mg 3 times daily.  No ibuprofen because of the thrombocytopenia.  She will qualify for Paxlovid if COVID is positive.  Will have her not start the Augmentin if COVID is positive and have her take Paxlovid instead.  She has a follow-up with her primary care provider.  Strict ER return precautions given.  Discussed labs, imaging, MDM, treatment plan, and plan for follow-up with patient. Discussed sn/sx that should  prompt return to the ED. patient agrees with plan.   Meds ordered this encounter  Medications   acetaminophen (TYLENOL) tablet 975 mg      *This clinic note was created using Scientist, clinical (histocompatibility and immunogenetics). Therefore, there may be occasional  mistakes despite careful proofreading.  ?    Domenick Gong, MD 10/25/22 2149

## 2022-10-26 LAB — SARS CORONAVIRUS 2 (TAT 6-24 HRS): SARS Coronavirus 2: NEGATIVE

## 2022-10-28 ENCOUNTER — Telehealth: Payer: Self-pay | Admitting: Family Medicine

## 2022-10-28 ENCOUNTER — Ambulatory Visit: Payer: Federal, State, Local not specified - PPO | Admitting: Hematology & Oncology

## 2022-10-28 ENCOUNTER — Inpatient Hospital Stay: Payer: Federal, State, Local not specified - PPO

## 2022-10-28 NOTE — Telephone Encounter (Signed)
Called Patient and let her know to take Ibuprofen or Tylenol for her fever. Patient states she has a fever of 99.4 and I advised that is not a fever. A fever is 100.3 or higher and I also advised that if the fever is not breaking with just Tylenol then she could rotate Tylenol and Ibuprofen by taking one of them then 4 hours later to take the other one. Patient asked if I was a Charity fundraiser and I stated no I am a CMA. Patient stated the front told her a RN would call her and I said well CMA's assist the providers. I also advised if her symptoms are not better on Monday to call her PCP.

## 2022-10-28 NOTE — Telephone Encounter (Signed)
Urgent care note reviewed.  It looks like they prescribed Augmentin for her to start on.  Has she started this?  There is not a prescription medicine that we send in to help with fever.  This is treated with over-the-counter ibuprofen and Tylenol.  She can take ibuprofen 100 mg every 8 hours alternating with Tylenol 1000 mg every 8 hours.  She should take the ibuprofen with food given risk of stomach irritation.  If not starting to improve by Monday she needs to follow-up with her PCP.  If worsening over the weekend she needs to be evaluated at urgent care again.

## 2022-10-28 NOTE — Telephone Encounter (Signed)
Pt called in stating that she went to the ED on 10/25/22 and she has an upper respiratory fever, and a fever of 101. And she called in asking if Dr. Clent Ridges can prescribed some med for her fever that she still having. ?

## 2022-10-28 NOTE — Telephone Encounter (Signed)
Left message to call the office back to give Dr. Purvis Sheffield recommendations below.

## 2022-10-31 NOTE — Telephone Encounter (Signed)
Lvm to give office a call back also mychart message sent with recommendations

## 2022-11-03 ENCOUNTER — Encounter: Payer: Self-pay | Admitting: Family Medicine

## 2022-11-03 ENCOUNTER — Inpatient Hospital Stay: Payer: Federal, State, Local not specified - PPO | Attending: Hematology & Oncology

## 2022-11-03 ENCOUNTER — Encounter: Payer: Self-pay | Admitting: Hematology & Oncology

## 2022-11-03 ENCOUNTER — Inpatient Hospital Stay (HOSPITAL_BASED_OUTPATIENT_CLINIC_OR_DEPARTMENT_OTHER): Payer: Federal, State, Local not specified - PPO | Admitting: Hematology & Oncology

## 2022-11-03 VITALS — BP 165/73 | HR 71 | Temp 98.8°F | Resp 18 | Wt 184.8 lb

## 2022-11-03 DIAGNOSIS — E119 Type 2 diabetes mellitus without complications: Secondary | ICD-10-CM | POA: Diagnosis not present

## 2022-11-03 DIAGNOSIS — K7581 Nonalcoholic steatohepatitis (NASH): Secondary | ICD-10-CM | POA: Diagnosis not present

## 2022-11-03 DIAGNOSIS — M3119 Other thrombotic microangiopathy: Secondary | ICD-10-CM | POA: Insufficient documentation

## 2022-11-03 LAB — CBC WITH DIFFERENTIAL (CANCER CENTER ONLY)
Abs Immature Granulocytes: 0.04 10*3/uL (ref 0.00–0.07)
Basophils Absolute: 0 10*3/uL (ref 0.0–0.1)
Basophils Relative: 0 %
Eosinophils Absolute: 0.2 10*3/uL (ref 0.0–0.5)
Eosinophils Relative: 4 %
HCT: 39.3 % (ref 36.0–46.0)
Hemoglobin: 13.2 g/dL (ref 12.0–15.0)
Immature Granulocytes: 1 %
Lymphocytes Relative: 35 %
Lymphs Abs: 1.5 10*3/uL (ref 0.7–4.0)
MCH: 29.9 pg (ref 26.0–34.0)
MCHC: 33.6 g/dL (ref 30.0–36.0)
MCV: 89.1 fL (ref 80.0–100.0)
Monocytes Absolute: 0.3 10*3/uL (ref 0.1–1.0)
Monocytes Relative: 8 %
Neutro Abs: 2.2 10*3/uL (ref 1.7–7.7)
Neutrophils Relative %: 52 %
Platelet Count: 148 10*3/uL — ABNORMAL LOW (ref 150–400)
RBC: 4.41 MIL/uL (ref 3.87–5.11)
RDW: 12.4 % (ref 11.5–15.5)
WBC Count: 4.2 10*3/uL (ref 4.0–10.5)
nRBC: 0 % (ref 0.0–0.2)

## 2022-11-03 LAB — CMP (CANCER CENTER ONLY)
ALT: 14 U/L (ref 0–44)
AST: 21 U/L (ref 15–41)
Albumin: 4.4 g/dL (ref 3.5–5.0)
Alkaline Phosphatase: 86 U/L (ref 38–126)
Anion gap: 6 (ref 5–15)
BUN: 8 mg/dL (ref 8–23)
CO2: 27 mmol/L (ref 22–32)
Calcium: 10.2 mg/dL (ref 8.9–10.3)
Chloride: 103 mmol/L (ref 98–111)
Creatinine: 0.72 mg/dL (ref 0.44–1.00)
GFR, Estimated: >60 mL/min (ref >60)
Glucose, Bld: 128 mg/dL — ABNORMAL HIGH (ref 70–99)
Potassium: 3.8 mmol/L (ref 3.5–5.1)
Sodium: 136 mmol/L (ref 135–145)
Total Bilirubin: 0.9 mg/dL (ref 0.3–1.2)
Total Protein: 7.7 g/dL (ref 6.5–8.1)

## 2022-11-03 LAB — RETICULOCYTES
Immature Retic Fract: 14.2 % (ref 2.3–15.9)
RBC.: 4.42 MIL/uL (ref 3.87–5.11)
Retic Count, Absolute: 71.2 10*3/uL (ref 19.0–186.0)
Retic Ct Pct: 1.6 % (ref 0.4–3.1)

## 2022-11-03 LAB — LACTATE DEHYDROGENASE: LDH: 143 U/L (ref 98–192)

## 2022-11-03 LAB — HEMOGLOBIN A1C
Hgb A1c MFr Bld: 6.7 % — ABNORMAL HIGH (ref 4.8–5.6)
Mean Plasma Glucose: 145.59 mg/dL

## 2022-11-03 LAB — FERRITIN: Ferritin: 119 ng/mL (ref 11–307)

## 2022-11-03 NOTE — Progress Notes (Signed)
Hematology and Oncology Follow Up Visit  Vicki [name] [id number] Nov 17, 1960 62 y.o. 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  We last saw her 6 months ago.  Since then, she been doing pretty well.  She has been helping take care of her mom.  Her mom is in her 16s.  She has had no problems with nausea or vomiting.  Her blood sugars are doing much better now..  She has had no bleeding.  There is been no change in bowel or bladder habits.  Of note, her last ADAMTS-13 was 95%.  She has had no problems with rashes.  There is been no leg swelling.  She has had no cough or shortness of breath.  There has been no problems with COVID.  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  Thankfully, this is not been a problem.  She does have NASH.  Currently, I would say that her performance status is probably ECOG 1.   Medications:  Current Outpatient Medications:    acetaminophen (TYLENOL) 325 MG tablet, Take 650 mg by mouth every 6 (six) hours as needed., Disp: , Rfl:    Alum Hydroxide-Mag Carbonate (GAVISCON EXTRA STRENGTH PO), Take 1 tablet by mouth 4 (four) times daily as needed., Disp: , Rfl:    Ascorbic Acid (VITAMIN C) 500 MG CAPS, Take by mouth., Disp: , Rfl:    cholecalciferol (VITAMIN D3) 25 MCG (1000 UT) tablet, Take 1,000 Units by mouth daily., Disp: , Rfl:    folic acid (FOLVITE) 1 MG tablet, Take 2 tablets by mouth once daily, Disp: 60 tablet, Rfl: 0   Lancets (ONETOUCH ULTRASOFT) lancets, 1 each by Other route daily at 12 noon. Use to check Glucose once daily as directed., Disp: 100 each, Rfl: 1   Milk Thistle 1000 MG CAPS, Take 1 capsule by mouth daily., Disp: , Rfl:    mometasone (ELOCON) 0.1 % cream, APPLY   CREAM TOPICALLY ONCE DAILY, Disp: 45 g, Rfl: 0   Multiple Vitamin (MULTIVITAMIN PO), Take by mouth., Disp: , Rfl:    Multiple Vitamins-Minerals (ZINC PO), Take by mouth., Disp: , Rfl:    ONETOUCH ULTRA test strip, USE AS INSTRUCTED BY CHECKING BLOOD SUGAR 1-2 TIMES DAILY, Disp: 100 each, Rfl: 0   TRULICITY 0.75 MG/0.5ML SOPN, INJECT 0.75 MG SUBCUTANEOUSLY ONCE A WEEK, Disp: 12 mL, Rfl: 0   vitamin B-12 (CYANOCOBALAMIN) 500 MCG tablet, Take 500 mcg by mouth daily., Disp: , Rfl:    amoxicillin-clavulanate (AUGMENTIN) 875-125 MG tablet, Take 1 tablet by mouth every 12 (twelve) hours. (Patient not taking: Reported on 11/03/2022), Disp: 14 tablet, Rfl: 0   gabapentin (NEURONTIN) 100 MG capsule, Take 1-3 capsules (100-300 mg total) by mouth at bedtime as needed. (Patient not taking: Reported on 10/24/2022), Disp: 90 capsule, Rfl: 1  Allergies:  Allergies  Allergen Reactions   Metformin And Related Itching    Heart palpitation   Sulfa Antibiotics Itching and Other (See Comments)    Past Medical History, Surgical history, Social history, and Family History were reviewed and updated.  Review of Systems: Review of Systems  Constitutional: Negative.   HENT:  Negative.    Eyes: Negative.   Respiratory: Negative.    Cardiovascular:  Positive for leg swelling.  Gastrointestinal: Negative.   Endocrine: Negative.   Genitourinary: Negative.    Musculoskeletal:  Positive for arthralgias and myalgias.  Skin: Negative.   Neurological: Negative.   Hematological: Negative.   Psychiatric/Behavioral: Negative.      Physical Exam:  weight is 184 lb 12.8 oz (83.8 kg). Her oral temperature is 98.8 F (37.1 C). Her blood pressure is 165/73 (abnormal) and her pulse is 71. Her respiration is 18 and oxygen saturation is 100%.   Wt Readings from Last 3 Encounters:  11/03/22 184 lb 12.8 oz (83.8 kg)  10/24/22 184 lb 6 oz (83.6 kg)  09/27/22 187 lb 3.2 oz (84.9 kg)    Physical Exam Vitals reviewed.   HENT:     Head: Normocephalic and atraumatic.  Eyes:     Pupils: Pupils are equal, round, and reactive to light.  Cardiovascular:     Rate and Rhythm: Normal rate and regular rhythm.     Heart sounds: Normal heart sounds.  Pulmonary:     Effort: Pulmonary effort is normal.     Breath sounds: Normal breath sounds.  Abdominal:     General: Bowel sounds are normal.     Palpations: Abdomen is soft.  Musculoskeletal:        General: No tenderness or deformity. Normal range of motion.     Cervical back: Normal range of motion.     Comments: Extremities shows some 1+ edema in her lower legs.  She has good range of motion of her joints.  She has good strength in upper and lower extremities.  Lymphadenopathy:     Cervical: No cervical adenopathy.  Skin:    General: Skin is warm and dry.     Findings: No erythema or rash.  Neurological:     Mental Status: She is alert and oriented to person, place, and time.  Psychiatric:        Behavior: Behavior normal.        Thought Content: Thought content normal.        Judgment: Judgment normal.      Lab Results  Component Value Date   WBC 4.2 11/03/2022   HGB 13.2 11/03/2022   HCT 39.3 11/03/2022   MCV 89.1 11/03/2022   PLT 148 (L) 11/03/2022     Chemistry      Component Value Date/Time   NA 136 11/03/2022 1338   NA 143 09/10/2021 1601   K 3.8 11/03/2022 1338   CL 103 11/03/2022 1338   CO2 27 11/03/2022 1338   BUN 8 11/03/2022 1338   BUN 9 09/10/2021 1601   CREATININE 0.72 11/03/2022 1338   CREATININE 0.70 09/25/2015 1504      Component Value Date/Time   CALCIUM 10.2 11/03/2022 1338   ALKPHOS 86 11/03/2022 1338   AST 21 11/03/2022 1338   ALT 14 11/03/2022 1338   BILITOT 0.9 11/03/2022 1338      Impression and Plan: Vicki [name] is a 63 year old African-American female.  She has acquired TTP.  She had relapsed.  She responded well to treatment with plasma exchange and Rituxan.  She is now has been off treatment now for 3  years.    Again, I do not see any problems with respect to the TTP.  I did get her blood smear.  I do not see anything that looks like active EDB.  I really do not see any schistocytes.  I am glad her blood sugars are doing much better.  I voiced  out that in the long-term, it is her diabetes that will cause her problems.  For right now, we will plan to get her back in another 6 months.  We will plan to get her through the Holiday season.    Josph Macho, MD 8/29/20243:07 PM

## 2022-11-04 LAB — IRON AND IRON BINDING CAPACITY (CC-WL,HP ONLY)
Iron: 143 ug/dL (ref 28–170)
Saturation Ratios: 45 % — ABNORMAL HIGH (ref 10.4–31.8)
TIBC: 316 ug/dL (ref 250–450)
UIBC: 173 ug/dL

## 2022-11-06 LAB — ADAMTS13 ACTIVITY REFLEX

## 2022-11-06 LAB — ADAMTS13 ACTIVITY: Adamts 13 Activity: 92 % (ref >66.8)

## 2022-11-08 ENCOUNTER — Encounter: Payer: Self-pay | Admitting: *Deleted

## 2022-11-09 NOTE — Telephone Encounter (Signed)
Lvm for pt to give our office a call back for clarification in regards to [employer].

## 2022-11-09 NOTE — Telephone Encounter (Signed)
Pt returned Vicki [name] CMA call. Unable to transfer. Pt stated she can call me tomorrow.

## 2022-11-11 NOTE — Telephone Encounter (Signed)
Spoke to pt see previous note 

## 2022-11-16 ENCOUNTER — Other Ambulatory Visit: Payer: Self-pay | Admitting: Hematology & Oncology

## 2022-11-16 DIAGNOSIS — M3119 Other thrombotic microangiopathy: Secondary | ICD-10-CM

## 2022-11-17 ENCOUNTER — Other Ambulatory Visit: Payer: Self-pay | Admitting: Family

## 2022-11-22 DIAGNOSIS — Z7985 Long-term (current) use of injectable non-insulin antidiabetic drugs: Secondary | ICD-10-CM | POA: Diagnosis not present

## 2022-11-22 DIAGNOSIS — K219 Gastro-esophageal reflux disease without esophagitis: Secondary | ICD-10-CM | POA: Diagnosis not present

## 2022-11-22 DIAGNOSIS — Z79899 Other long term (current) drug therapy: Secondary | ICD-10-CM | POA: Diagnosis not present

## 2022-11-22 DIAGNOSIS — E785 Hyperlipidemia, unspecified: Secondary | ICD-10-CM | POA: Diagnosis not present

## 2022-11-22 DIAGNOSIS — D696 Thrombocytopenia, unspecified: Secondary | ICD-10-CM | POA: Diagnosis not present

## 2022-11-22 DIAGNOSIS — Z713 Dietary counseling and surveillance: Secondary | ICD-10-CM | POA: Diagnosis not present

## 2022-11-22 DIAGNOSIS — K7581 Nonalcoholic steatohepatitis (NASH): Secondary | ICD-10-CM | POA: Diagnosis not present

## 2022-11-22 DIAGNOSIS — I10 Essential (primary) hypertension: Secondary | ICD-10-CM | POA: Diagnosis not present

## 2022-11-22 DIAGNOSIS — K7460 Unspecified cirrhosis of liver: Secondary | ICD-10-CM | POA: Diagnosis not present

## 2022-11-22 DIAGNOSIS — E119 Type 2 diabetes mellitus without complications: Secondary | ICD-10-CM | POA: Diagnosis not present

## 2022-11-22 DIAGNOSIS — Z6834 Body mass index (BMI) 34.0-34.9, adult: Secondary | ICD-10-CM | POA: Diagnosis not present

## 2022-11-22 DIAGNOSIS — E8889 Other specified metabolic disorders: Secondary | ICD-10-CM | POA: Diagnosis not present

## 2022-11-22 DIAGNOSIS — E669 Obesity, unspecified: Secondary | ICD-10-CM | POA: Diagnosis not present

## 2022-12-06 ENCOUNTER — Ambulatory Visit: Payer: Federal, State, Local not specified - PPO | Admitting: Family Medicine

## 2022-12-06 ENCOUNTER — Encounter: Payer: Self-pay | Admitting: Family Medicine

## 2022-12-06 VITALS — BP 132/70 | HR 83 | Temp 97.9°F | Resp 16 | Ht 61.2 in | Wt 183.1 lb

## 2022-12-06 DIAGNOSIS — N3281 Overactive bladder: Secondary | ICD-10-CM

## 2022-12-06 DIAGNOSIS — E118 Type 2 diabetes mellitus with unspecified complications: Secondary | ICD-10-CM | POA: Diagnosis not present

## 2022-12-06 DIAGNOSIS — L732 Hidradenitis suppurativa: Secondary | ICD-10-CM | POA: Diagnosis not present

## 2022-12-06 MED ORDER — BENZOYL PEROXIDE WASH 5 % EX LIQD: Freq: Two times a day (BID) | CUTANEOUS | 12 refills | Status: DC

## 2022-12-06 MED ORDER — DOXYCYCLINE HYCLATE 100 MG PO TABS
100.0000 mg | ORAL_TABLET | Freq: Two times a day (BID) | ORAL | 0 refills | Status: AC
Start: 2022-12-06 — End: 2022-12-20

## 2022-12-06 NOTE — Patient Instructions (Addendum)
It was a pleasure meeting you today. Thank you for allowing me to take part in your health care.  Our goals for today as we discussed include:  Start Doxycycline 100 mg two times a day Use Bezyol peroxide two times a day.  This can be purchased over the counter.  Please search [email or web address]  2 closest Dermatologists   Dr. Moshe Cipro, MD FA[facility] Distance: [phone] mi [phone]  [facility], [address], [place], [place] [address]  Dr. Patrick North, MD [facility] Distance: [phone] mi [phone] [facility], [address] [place], [place], [place] [address]   Follow up if no improvement  If you have any questions or concerns, please do not hesitate to call the office at [phone].  I look forward to our next visit and until then take care and stay safe.  Regards,   Dana Allan, MD   [facility]

## 2022-12-06 NOTE — Progress Notes (Signed)
SUBJECTIVE:   Chief Complaint  Patient presents with   Rash    Underwear line down to the thigh X 2.5 weeks. Pt states it does not itch just burns a little.   HPI Presents to clinic for acute visit   Discussed the use of AI scribe software for clinical note transcription with the patient, who gave verbal consent to proceed.  History of Present Illness The patient, with a history of hidradenitis suppurativa, diabetes, and liver disease, presents with a persistent rash in the groin area. She reports a history of recurrent boils, which she attempted to self-treat with castor oil approximately two weeks ago. Following the application of castor oil, she noticed an increase in the number of bumps in the area, some of which have developed into whiteheads. The rash is not itchy but some of the lesions are painful.  The patient has been using polysporin and cortisone creams in an attempt to clear the rash, but without success. She also reports using Dove sensitive skin soap and Dial antibacterial soap for cleansing the area. She has tried soaking in Epsom salt, which provided some relief.  The patient also reports urinary incontinence and uses pads to manage this. She has tried both scented and organic pads. She wears mostly shorts and notes that her underwear seems to cut into the affected area.  The patient's diabetes is reportedly well-controlled, with recent blood glucose readings of 120 and 111. She has not noticed any fever but reports that the skin in the affected area has darkened.  The patient has previously seen a dermatologist for her skin condition, but her usual clinic has closed and she has been unable to secure an appointment with a new dermatologist until next year. She expresses a willingness to travel for specialist care.    PERTINENT PMH / PSH: As Above  OBJECTIVE:  BP 132/70   Pulse 83   Temp 97.9 F (36.6 C)   Resp 16   Ht 5' 1.2" (1.554 m)   Wt 183 lb 2 oz (83.1 kg)    LMP 04/30/2012 Comment: not sexually active  SpO2 97%   BMI 34.38 kg/m    Physical Exam Exam conducted with a chaperone present.  Genitourinary:    Exam position: Supine.  Skin:    Findings: Rash present. Rash is nodular.     Comments: Scaring along bilateral gluteal fold and bilateral inguinal area. Some with yellow pustular lesions, one that popped on exam        12/06/2022    2:33 PM 10/24/2022    8:55 AM 09/27/2022    3:11 PM 03/09/2022    3:16 PM 12/21/2021    2:28 PM  Depression screen PHQ 2/9  Decreased Interest 0 0 1 1 0  Down, Depressed, Hopeless 1 0 1 0 0  PHQ - 2 Score 1 0 2 1 0  Altered sleeping 2 2 1 2  0  Tired, decreased energy 1 3 1 1  0  Change in appetite 0 0 2 0 0  Feeling bad or failure about yourself  0 0 1 0 0  Trouble concentrating 0 0 0 0 0  Moving slowly or fidgety/restless 0 0 0 0 0  Suicidal thoughts 0 0 0 0 0  PHQ-9 Score 4 5 7 4  0  Difficult doing work/chores Not difficult at all Somewhat difficult Not difficult at all Not difficult at all Not difficult at all      12/06/2022    2:33 PM 10/24/2022  8:55 AM 09/27/2022    3:11 PM 03/09/2022    3:17 PM  GAD 7 : Generalized Anxiety Score  Nervous, Anxious, on Edge 1 1 1 1   Control/stop worrying 0 0 0 0  Worry too much - different things 1 1 1  0  Trouble relaxing 0 0 0 0  Restless 0 0 0 0  Easily annoyed or irritable 1 1 1 1   Afraid - awful might happen 1 1 0 1  Total GAD 7 Score 4 4 3 3   Anxiety Difficulty Not difficult at all Not difficult at all Not difficult at all Not difficult at all    ASSESSMENT/PLAN:  Hidradenitis suppurativa Assessment & Plan: Recurrent boils in the groin and buttock area, exacerbated by use of castor oil and various ointments. No relief with Polysporin or cortisone. Some lesions have ruptured. -Start Doxycycline for 14 days. -Avoid sun exposure while on Doxycycline to prevent rash. Use sunscreen or cover up if sun exposure is unavoidable.  -Continue warm water  soaks and avoid use of various soaps and lotions in the area. -Refer to dermatologist (Dr. Benancio Deeds) for further management.  Orders: -     Benzoyl Peroxide Wash; Apply topically 2 (two) times daily.  Dispense: 142 g; Refill: 12 -     Doxycycline Hyclate; Take 1 tablet (100 mg total) by mouth 2 (two) times daily for 14 days.  Dispense: 28 tablet; Refill: 0  Type 2 diabetes mellitus with complications (HCC) Assessment & Plan: Recent blood sugars in the range of 111-120. -Continue current management.   Overactive bladder Assessment & Plan: Use of pads to manage urinary leakage. -Continue current management. -Recommend Kegel exercises and pelvic therapy -Consider UroGyn referral in future if no improvement in symptoms     PDMP reviewed  Return if symptoms worsen or fail to improve, for PCP.  Dana Allan, MD

## 2022-12-14 ENCOUNTER — Other Ambulatory Visit: Payer: Self-pay | Admitting: Podiatry

## 2022-12-14 ENCOUNTER — Encounter: Payer: Self-pay | Admitting: Family Medicine

## 2022-12-14 DIAGNOSIS — B353 Tinea pedis: Secondary | ICD-10-CM

## 2022-12-14 DIAGNOSIS — N3281 Overactive bladder: Secondary | ICD-10-CM | POA: Insufficient documentation

## 2022-12-14 NOTE — Assessment & Plan Note (Addendum)
Recurrent boils in the groin and buttock area, exacerbated by use of castor oil and various ointments. No relief with Polysporin or cortisone. Some lesions have ruptured. -Start Doxycycline for 14 days. -Avoid sun exposure while on Doxycycline to prevent rash. Use sunscreen or cover up if sun exposure is unavoidable.  -Continue warm water soaks and avoid use of various soaps and lotions in the area. -Refer to dermatologist (Dr. Benancio Deeds) for further management.

## 2022-12-14 NOTE — Assessment & Plan Note (Signed)
Use of pads to manage urinary leakage. -Continue current management. -Recommend Kegel exercises and pelvic therapy -Consider UroGyn referral in future if no improvement in symptoms

## 2022-12-14 NOTE — Assessment & Plan Note (Signed)
Recent blood sugars in the range of 111-120. -Continue current management.

## 2022-12-22 DIAGNOSIS — K7460 Unspecified cirrhosis of liver: Secondary | ICD-10-CM | POA: Diagnosis not present

## 2022-12-22 DIAGNOSIS — K838 Other specified diseases of biliary tract: Secondary | ICD-10-CM | POA: Diagnosis not present

## 2022-12-22 DIAGNOSIS — K7581 Nonalcoholic steatohepatitis (NASH): Secondary | ICD-10-CM | POA: Diagnosis not present

## 2022-12-27 ENCOUNTER — Other Ambulatory Visit (HOSPITAL_COMMUNITY): Payer: Self-pay

## 2023-01-04 ENCOUNTER — Other Ambulatory Visit: Payer: Self-pay | Admitting: Family

## 2023-01-16 ENCOUNTER — Ambulatory Visit: Payer: Federal, State, Local not specified - PPO | Admitting: Family Medicine

## 2023-01-26 ENCOUNTER — Ambulatory Visit: Payer: Federal, State, Local not specified - PPO | Admitting: Family Medicine

## 2023-01-30 ENCOUNTER — Ambulatory Visit: Payer: Federal, State, Local not specified - PPO | Admitting: Family Medicine

## 2023-01-31 ENCOUNTER — Encounter: Payer: Self-pay | Admitting: Family

## 2023-01-31 ENCOUNTER — Ambulatory Visit: Payer: Federal, State, Local not specified - PPO | Admitting: Family

## 2023-01-31 VITALS — BP 136/76 | HR 78 | Temp 98.2°F | Ht 62.0 in | Wt 183.0 lb

## 2023-01-31 DIAGNOSIS — M3119 Other thrombotic microangiopathy: Secondary | ICD-10-CM

## 2023-01-31 DIAGNOSIS — R4589 Other symptoms and signs involving emotional state: Secondary | ICD-10-CM | POA: Insufficient documentation

## 2023-01-31 DIAGNOSIS — I10 Essential (primary) hypertension: Secondary | ICD-10-CM | POA: Diagnosis not present

## 2023-01-31 DIAGNOSIS — E118 Type 2 diabetes mellitus with unspecified complications: Secondary | ICD-10-CM | POA: Diagnosis not present

## 2023-01-31 DIAGNOSIS — E119 Type 2 diabetes mellitus without complications: Secondary | ICD-10-CM

## 2023-01-31 LAB — CBC WITH DIFFERENTIAL/PLATELET
Basophils Absolute: 0 10*3/uL (ref 0.0–0.1)
Basophils Relative: 0.8 % (ref 0.0–3.0)
Eosinophils Absolute: 0.2 10*3/uL (ref 0.0–0.7)
Eosinophils Relative: 5.7 % — ABNORMAL HIGH (ref 0.0–5.0)
HCT: 41.5 % (ref 36.0–46.0)
Hemoglobin: 13.7 g/dL (ref 12.0–15.0)
Lymphocytes Relative: 38.6 % (ref 12.0–46.0)
Lymphs Abs: 1.4 10*3/uL (ref 0.7–4.0)
MCHC: 33 g/dL (ref 30.0–36.0)
MCV: 90.8 fL (ref 78.0–100.0)
Monocytes Absolute: 0.3 10*3/uL (ref 0.1–1.0)
Monocytes Relative: 7.2 % (ref 3.0–12.0)
Neutro Abs: 1.8 10*3/uL (ref 1.4–7.7)
Neutrophils Relative %: 47.7 % (ref 43.0–77.0)
Platelets: 135 10*3/uL — ABNORMAL LOW (ref 150.0–400.0)
RBC: 4.57 Mil/uL (ref 3.87–5.11)
RDW: 13.4 % (ref 11.5–15.5)
WBC: 3.7 10*3/uL — ABNORMAL LOW (ref 4.0–10.5)

## 2023-01-31 LAB — COMPREHENSIVE METABOLIC PANEL
ALT: 16 U/L (ref 0–35)
AST: 22 U/L (ref 0–37)
Albumin: 4.4 g/dL (ref 3.5–5.2)
Alkaline Phosphatase: 107 U/L (ref 39–117)
BUN: 8 mg/dL (ref 6–23)
CO2: 29 meq/L (ref 19–32)
Calcium: 9.7 mg/dL (ref 8.4–10.5)
Chloride: 105 meq/L (ref 96–112)
Creatinine, Ser: 0.73 mg/dL (ref 0.40–1.20)
GFR: 87.54 mL/min (ref >60.00)
Glucose, Bld: 132 mg/dL — ABNORMAL HIGH (ref 70–99)
Potassium: 3.9 meq/L (ref 3.5–5.1)
Sodium: 138 meq/L (ref 135–145)
Total Bilirubin: 1 mg/dL (ref 0.2–1.2)
Total Protein: 7.1 g/dL (ref 6.0–8.3)

## 2023-01-31 LAB — HEMOGLOBIN A1C: Hgb A1c MFr Bld: 6.7 % — ABNORMAL HIGH (ref 4.6–6.5)

## 2023-01-31 NOTE — Assessment & Plan Note (Signed)
PHQ-9 improved from prior.  Discussed anxiety in regards to health.  Patient very politely declines medication or counseling therapy at this time.  Advised her to let us know how she is doing

## 2023-01-31 NOTE — Assessment & Plan Note (Signed)
Pending A1c.  Continue Trulicity 0.75mg  weekly.

## 2023-01-31 NOTE — Progress Notes (Signed)
Assessment & Plan:  Essential hypertension  TTP (thrombotic thrombocytopenic purpura) (HCC) -     CBC with Differential/Platelet  Type 2 diabetes mellitus with complications (HCC) -     Hemoglobin A1c -     Comprehensive metabolic panel  Controlled type 2 diabetes mellitus without complication, without long-term current use of insulin (HCC) Assessment & Plan: Pending A1c.  Continue Trulicity 0.75mg  weekly.    Anxiety about health Assessment & Plan: PHQ-9 improved from prior.  Discussed anxiety in regards to health.  Patient very politely declines medication or counseling therapy at this time.  Advised her to let us know how she is doing      Return precautions given.   Risks, benefits, and alternatives of the medications and treatment plan prescribed today were discussed, and patient expressed understanding.   Education regarding symptom management and diagnosis given to patient on AVS either electronically or printed.  No follow-ups on file.  Rennie Plowman, FNP  Subjective:    Patient ID: Vicki [name], female    DOB: Aug 12, 1960, 62 y.o.   MRN: [id number]  CC: Vicki [name] is a 62 y.o. female who presents today for follow up.   HPI: Overall feels well today.  No new complaints.  She does note some increased anxiety as she worries about her health.  She is close with her family.  She politely declines medication or counseling at this time. She is compliant with Trulicity 0.75 mg.  She is tolerating medication  Previously declined statin therapy.       Allergies: Metformin and related and Sulfa antibiotics Current Outpatient Medications on File Prior to Visit  Medication Sig Dispense Refill   acetaminophen (TYLENOL) 325 MG tablet Take 650 mg by mouth every 6 (six) hours as needed.     Alum Hydroxide-Mag Carbonate (GAVISCON EXTRA STRENGTH PO) Take 1 tablet by mouth 4 (four) times daily as needed.     Ascorbic Acid (VITAMIN C) 500 MG CAPS Take by  mouth.     benzoyl peroxide 5 % external liquid Apply topically 2 (two) times daily. 142 g 12   cholecalciferol (VITAMIN D3) 25 MCG (1000 UT) tablet Take 1,000 Units by mouth daily.     folic acid (FOLVITE) 1 MG tablet Take 2 tablets by mouth once daily 60 tablet 0   Lancets (ONETOUCH ULTRASOFT) lancets 1 each by Other route daily at 12 noon. Use to check Glucose once daily as directed. 100 each 1   Milk Thistle 1000 MG CAPS Take 1 capsule by mouth daily.     mometasone (ELOCON) 0.1 % cream APPLY  CREAM TOPICALLY ONCE DAILY 45 g 0   Multiple Vitamin (MULTIVITAMIN PO) Take by mouth.     Multiple Vitamins-Minerals (ZINC PO) Take by mouth.     ONETOUCH ULTRA test strip USE  STRIP TO CHECK GLUCOSE ONCE TO TWICE DAILY AS DIRECTED 100 each 0   TRULICITY 0.75 MG/0.5ML SOPN INJECT 0.75 MG SUBCUTANEOUSLY ONCE A WEEK 12 mL 0   vitamin B-12 (CYANOCOBALAMIN) 500 MCG tablet Take 500 mcg by mouth daily.     No current facility-administered medications on file prior to visit.    Review of Systems  Constitutional:  Negative for chills and fever.  Respiratory:  Negative for cough.   Cardiovascular:  Negative for chest pain and palpitations.  Gastrointestinal:  Negative for nausea and vomiting.  Psychiatric/Behavioral:  Negative for suicidal ideas. The patient is nervous/anxious.       Objective:  BP 136/76   Pulse 78   Temp 98.2 F (36.8 C) (Oral)   Ht 5\' 2"  (1.575 m)   Wt 183 lb (83 kg)   LMP 04/30/2012 Comment: not sexually active  SpO2 97%   BMI 33.47 kg/m  BP Readings from Last 3 Encounters:  01/31/23 136/76  12/06/22 132/70  11/03/22 (!) 165/73   Wt Readings from Last 3 Encounters:  01/31/23 183 lb (83 kg)  12/06/22 183 lb 2 oz (83.1 kg)  11/03/22 184 lb 12.8 oz (83.8 kg)      12/06/2022    2:33 PM 10/24/2022    8:55 AM 09/27/2022    3:11 PM  Depression screen PHQ 2/9  Decreased Interest 0 0 1  Down, Depressed, Hopeless 1 0 1  PHQ - 2 Score 1 0 2  Altered sleeping 2 2 1    Tired, decreased energy 1 3 1   Change in appetite 0 0 2  Feeling bad or failure about yourself  0 0 1  Trouble concentrating 0 0 0  Moving slowly or fidgety/restless 0 0 0  Suicidal thoughts 0 0 0  PHQ-9 Score 4 5 7   Difficult doing work/chores Not difficult at all Somewhat difficult Not difficult at all     Physical Exam Vitals reviewed.  Constitutional:      Appearance: She is well-developed.  Eyes:     Conjunctiva/sclera: Conjunctivae normal.  Cardiovascular:     Rate and Rhythm: Normal rate and regular rhythm.     Pulses: Normal pulses.     Heart sounds: Normal heart sounds.  Pulmonary:     Effort: Pulmonary effort is normal.     Breath sounds: Normal breath sounds. No wheezing, rhonchi or rales.  Skin:    General: Skin is warm and dry.  Neurological:     Mental Status: She is alert.  Psychiatric:        Speech: Speech normal.        Behavior: Behavior normal.        Thought Content: Thought content normal.

## 2023-02-01 ENCOUNTER — Encounter: Payer: Self-pay | Admitting: Family

## 2023-02-01 ENCOUNTER — Ambulatory Visit: Payer: Federal, State, Local not specified - PPO | Admitting: Family Medicine

## 2023-02-01 DIAGNOSIS — R898 Other abnormal findings in specimens from other organs, systems and tissues: Secondary | ICD-10-CM

## 2023-02-10 DIAGNOSIS — K838 Other specified diseases of biliary tract: Secondary | ICD-10-CM | POA: Diagnosis not present

## 2023-02-10 DIAGNOSIS — K7460 Unspecified cirrhosis of liver: Secondary | ICD-10-CM | POA: Diagnosis not present

## 2023-02-13 ENCOUNTER — Ambulatory Visit: Payer: Federal, State, Local not specified - PPO | Admitting: Family Medicine

## 2023-02-20 ENCOUNTER — Other Ambulatory Visit: Payer: Self-pay | Admitting: Family Medicine

## 2023-02-23 NOTE — Telephone Encounter (Signed)
Left a voice message asking patient to give the office a call back. Will also send a MyChart message to the patient asking for to call office to set up an appointment(s) for labs and an appointment to follow up about her blood pressure.   Place the lab order for CBC with Diff as future (4-6 weeks)

## 2023-02-24 ENCOUNTER — Ambulatory Visit: Payer: Federal, State, Local not specified - PPO | Admitting: Family Medicine

## 2023-02-24 NOTE — Addendum Note (Signed)
Addended by: Warden Fillers on: 02/24/2023 10:56 AM   Modules accepted: Orders

## 2023-03-02 ENCOUNTER — Telehealth: Payer: Self-pay

## 2023-03-02 ENCOUNTER — Other Ambulatory Visit: Payer: Federal, State, Local not specified - PPO

## 2023-03-02 NOTE — Telephone Encounter (Signed)
Copied from CRM [id number]. Topic: Appointments - Appointment Scheduling >> Mar 02, 2023  4:35 PM Tiffany H wrote: Patient/patient representative is calling to schedule an appointment. Refer to attachments for appointment information.

## 2023-03-02 NOTE — Telephone Encounter (Signed)
Noted  

## 2023-03-03 ENCOUNTER — Other Ambulatory Visit (INDEPENDENT_AMBULATORY_CARE_PROVIDER_SITE_OTHER): Payer: Federal, State, Local not specified - PPO

## 2023-03-03 DIAGNOSIS — R898 Other abnormal findings in specimens from other organs, systems and tissues: Secondary | ICD-10-CM | POA: Diagnosis not present

## 2023-03-03 LAB — CBC WITH DIFFERENTIAL/PLATELET
Absolute Lymphocytes: 1526 {cells}/uL (ref 850–3900)
Absolute Monocytes: 389 {cells}/uL (ref 200–950)
Basophils Absolute: 19 {cells}/uL (ref 0–200)
Basophils Relative: 0.4 %
Eosinophils Absolute: 187 {cells}/uL (ref 15–500)
Eosinophils Relative: 3.9 %
HCT: 38.8 % (ref 35.0–45.0)
Hemoglobin: 12.8 g/dL (ref 11.7–15.5)
MCH: 30 pg (ref 27.0–33.0)
MCHC: 33 g/dL (ref 32.0–36.0)
MCV: 90.9 fL (ref 80.0–100.0)
MPV: 11.3 fL (ref 7.5–12.5)
Monocytes Relative: 8.1 %
Neutro Abs: 2678 {cells}/uL (ref 1500–7800)
Neutrophils Relative %: 55.8 %
Platelets: 137 10*3/uL — ABNORMAL LOW (ref 140–400)
RBC: 4.27 10*6/uL (ref 3.80–5.10)
RDW: 12.2 % (ref 11.0–15.0)
Total Lymphocyte: 31.8 %
WBC: 4.8 10*3/uL (ref 3.8–10.8)

## 2023-03-05 ENCOUNTER — Encounter: Payer: Self-pay | Admitting: Family Medicine

## 2023-03-06 ENCOUNTER — Other Ambulatory Visit: Payer: Self-pay | Admitting: Hematology & Oncology

## 2023-03-06 DIAGNOSIS — M3119 Other thrombotic microangiopathy: Secondary | ICD-10-CM

## 2023-03-22 ENCOUNTER — Other Ambulatory Visit (HOSPITAL_COMMUNITY): Payer: Self-pay

## 2023-03-23 ENCOUNTER — Other Ambulatory Visit: Payer: Self-pay | Admitting: Family Medicine

## 2023-03-23 DIAGNOSIS — E119 Type 2 diabetes mellitus without complications: Secondary | ICD-10-CM

## 2023-03-23 NOTE — Telephone Encounter (Signed)
Copied from CRM [id number]. Topic: Clinical - Medication Refill >> Mar 23, 2023 12:09 PM Prudencio Pair wrote: Most Recent Primary Care Visit:  Provider: [facility]  Department: [facility]  Visit Type: LAB VISIT  Date: 03/03/2023  Medication: ***  Has the patient contacted their pharmacy?  (Agent: If no, request that the patient contact the pharmacy for the refill. If patient does not wish to contact the pharmacy document the reason why and proceed with request.) (Agent: If yes, when and what did the pharmacy advise?)  Is this the correct pharmacy for this prescription?  If no, delete pharmacy and type the correct one.  This is the patient's preferred pharmacy:  [facility] [address], [place] - [address] [place] [address] Phone: [phone] Fax: [phone]   Has the prescription been filled recently?   Is the patient out of the medication?   Has the patient been seen for an appointment in the last year OR does the patient have an upcoming appointment?   Can we respond through MyChart?   Agent: Please be advised that Rx refills may take up to 3 business days. We ask that you follow-up with your pharmacy.

## 2023-03-23 NOTE — Telephone Encounter (Signed)
Copied from CRM [id number]. Topic: Clinical - Medication Refill >> Mar 23, 2023 12:09 PM Prudencio Pair wrote: Most Recent Primary Care Visit:  Provider: [facility]  Department: [facility]  Visit Type: LAB VISIT  Date: 03/03/2023  Medication: TRULICITY 0.75 MG/0.5ML SOPN  Has the patient contacted their pharmacy? Yes, pharamcy stated they sent over a request for a PA on January 2nd & 7th & have not heard back. (Agent: If no, request that the patient contact the pharmacy for the refill. If patient does not wish to contact the pharmacy document the reason why and proceed with request.) (Agent: If yes, when and what did the pharmacy advise?)  Is this the correct pharmacy for this prescription? Yes If no, delete pharmacy and type the correct one.  This is the patient's preferred pharmacy:  [facility] [address] [place] - [address] [place] [place] [address] Phone: [phone] Fax: [phone]   Has the prescription been filled recently? Yes  Is the patient out of the medication? Yes  Has the patient been seen for an appointment in the last year OR does the patient have an upcoming appointment? Yes  Can we respond through MyChart? Yes  Agent: Please be advised that Rx refills may take up to 3 business days. We ask that you follow-up with your pharmacy.

## 2023-03-24 ENCOUNTER — Telehealth: Payer: Self-pay

## 2023-03-24 MED ORDER — TRULICITY 0.75 MG/0.5ML ~~LOC~~ SOAJ
0.7500 mg | SUBCUTANEOUS | 1 refills | Status: DC
Start: 2023-03-24 — End: 2023-09-27

## 2023-03-24 NOTE — Telephone Encounter (Signed)
PT needs a PA for Trulicity.

## 2023-03-28 ENCOUNTER — Other Ambulatory Visit (HOSPITAL_COMMUNITY): Payer: Self-pay

## 2023-03-28 ENCOUNTER — Telehealth: Payer: Self-pay

## 2023-03-28 NOTE — Telephone Encounter (Signed)
Pharmacy Patient Advocate Encounter   Received notification from Pt Calls Messages that prior authorization for Trulicity 0.75MG /0.5ML auto-injectors is required/requested.   Insurance verification completed.   The patient is insured through [facility] .   Per test claim: PA required; PA submitted to above mentioned insurance via CoverMyMeds Key/confirmation #/EOC [id number] Status is pending

## 2023-03-28 NOTE — Telephone Encounter (Signed)
Noted  

## 2023-03-29 ENCOUNTER — Other Ambulatory Visit (HOSPITAL_COMMUNITY): Payer: Self-pay

## 2023-03-29 NOTE — Telephone Encounter (Signed)
Pharmacy Patient Advocate Encounter  Received notification from [facility] that Prior Authorization for Trulicity 0.75MG /0.5ML auto-injectors  has been APPROVED from 03/28/23 to 03/27/24. Unable to obtain price due to refill too soon rejection, last fill date 03/29/23 next available fill date02/12/25   PA #/Case ID/Reference #: [id number]

## 2023-03-30 ENCOUNTER — Telehealth: Payer: Self-pay

## 2023-03-30 NOTE — Telephone Encounter (Signed)
Left message to return call to our office.  Need to know which pharmacy pt uses to receive her Trulicity.

## 2023-04-20 ENCOUNTER — Encounter: Payer: Self-pay | Admitting: Nurse Practitioner

## 2023-04-20 ENCOUNTER — Telehealth: Payer: Self-pay

## 2023-04-20 ENCOUNTER — Ambulatory Visit
Admission: RE | Admit: 2023-04-20 | Discharge: 2023-04-20 | Disposition: A | Discharge status: Home / Self Care | Payer: Federal, State, Local not specified - PPO | Source: Ambulatory Visit | Attending: Nurse Practitioner | Admitting: Nurse Practitioner

## 2023-04-20 ENCOUNTER — Ambulatory Visit
Admission: RE | Admit: 2023-04-20 | Discharge: 2023-04-20 | Disposition: A | Discharge status: Home / Self Care | Payer: Federal, State, Local not specified - PPO | Attending: Nurse Practitioner | Admitting: Nurse Practitioner

## 2023-04-20 ENCOUNTER — Ambulatory Visit: Payer: Federal, State, Local not specified - PPO | Admitting: Nurse Practitioner

## 2023-04-20 VITALS — BP 128/76 | HR 80 | Temp 98.8°F | Ht 62.0 in | Wt 182.0 lb

## 2023-04-20 DIAGNOSIS — R059 Cough, unspecified: Secondary | ICD-10-CM

## 2023-04-20 DIAGNOSIS — J22 Unspecified acute lower respiratory infection: Secondary | ICD-10-CM | POA: Diagnosis not present

## 2023-04-20 DIAGNOSIS — R509 Fever, unspecified: Secondary | ICD-10-CM

## 2023-04-20 DIAGNOSIS — R5383 Other fatigue: Secondary | ICD-10-CM | POA: Diagnosis not present

## 2023-04-20 DIAGNOSIS — R52 Pain, unspecified: Secondary | ICD-10-CM

## 2023-04-20 DIAGNOSIS — R062 Wheezing: Secondary | ICD-10-CM | POA: Diagnosis not present

## 2023-04-20 LAB — POCT INFLUENZA A/B
Influenza A, POC: NEGATIVE
Influenza B, POC: NEGATIVE

## 2023-04-20 MED ORDER — BENZONATATE 100 MG PO CAPS: 100.0000 mg | ORAL_CAPSULE | Freq: Three times a day (TID) | ORAL | 0 refills | Status: DC | PRN

## 2023-04-20 MED ORDER — AMOXICILLIN-POT CLAVULANATE 875-125 MG PO TABS
1.0000 | ORAL_TABLET | Freq: Two times a day (BID) | ORAL | 0 refills | Status: DC
Start: 2023-04-20 — End: 2023-05-29

## 2023-04-20 MED ORDER — PREDNISONE 20 MG PO TABS: 40.0000 mg | ORAL_TABLET | Freq: Every day | ORAL | 0 refills | Status: AC

## 2023-04-20 NOTE — Telephone Encounter (Signed)
-----   Message from Kara Dies sent at 04/20/2023  4:15 PM EST ----- Please call pt: The chest x-ray is clear.  No infection or mass seen on the x-ray.

## 2023-04-20 NOTE — Progress Notes (Signed)
Established Patient Office Visit  Subjective:  Patient ID: Vicki [name], female    DOB: 17-Oct-1960  Age: 63 y.o. MRN: [id number]  CC:  Chief Complaint  Patient presents with   Cough    Cough, cold sweat & sore abdomen   Discussed the use of a AI scribe software for clinical note transcription with the patient, who gave verbal consent to proceed.  HPI  Vicki [name] presents for  who presents with cough, wheezing, and sweating.   Symptoms began on Monday with a cold, fever, and productive cough with significant mucus production. She has been using Tylenol, dual action Tylenol, Advil, and Mucinex DM to manage these symptoms. Wheezing is present, and she experiences soreness in her back and abdomen, likely due to coughing.  She has a decreased appetite and has been primarily consuming soup. She reports a sore and scratchy throat but no significant abdominal pain. Home tests for flu and COVID-19 were negative.  She notes a change in bowel movements, with constipation yesterday followed by looser stools this morning. No significant ear congestion, but she feels 'stopped up' on the left side.  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    Past Surgical History:  Procedure Laterality Date   BREAST CYST ASPIRATION Left    COLONOSCOPY  2014   ECTOPIC PREGNANCY SURGERY  1990   IR FLUORO GUIDE CV LINE RIGHT  03/05/2019   IR FLUORO GUIDE CV LINE RIGHT  03/15/2019   IR FLUORO GUIDE CV LINE RIGHT  05/17/2019   IR FLUORO GUIDE CV LINE RIGHT  05/23/2019   IR  REMOVAL TUN CV CATH W/O FL  04/12/2019   IR REMOVAL TUN CV CATH W/O FL  08/06/2019   IR US GUIDE VASC ACCESS RIGHT  03/05/2019   IR US GUIDE VASC ACCESS RIGHT  03/15/2019   IR US GUIDE VASC ACCESS RIGHT  05/17/2019   TONSILLECTOMY  1980   UPPER GI ENDOSCOPY  08/2017   [facility]    Family History  Problem Relation Age of Onset   Hypertension Mother    Diabetes Mother    Heart attack Mother 33   Asthma Mother        Childhood   Arthritis Mother    Heart disease Mother    Hyperlipidemia Mother    Hypertension Sister    Diabetes Sister    Asthma Sister 40       asthma attack cause of death   Diabetes Brother    Hypertension Brother    Diabetes Brother    Hypertension Brother    Hypertension Maternal Grandmother    Heart attack Maternal Grandfather 73   Colon cancer Neg Hx    Thyroid cancer Neg Hx     Social History   Socioeconomic History   Marital status: Single    Spouse name: Not on file   Number of children: 1   Years of education: Not on file   Highest  education level: Some college, no degree  Occupational History    Employer: [employer]  Tobacco Use   Smoking status: Never   Smokeless tobacco: Never  Vaping Use   Vaping status: Never Used  Substance and Sexual Activity   Alcohol use: Yes    Comment: 2 a month   Drug use: No   Sexual activity: Not Currently    Partners: Male    Birth control/protection: Post-menopausal  Other Topics Concern   Not on file  Social History Narrative   02/26/19   From: the area   Living: lives with roommate - they get along   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      Family: Daughter - Higher education careers adviser - good relationship currently age 73       Enjoys: watch TV and read      Exercise: dancing, on her own   Diet: does not follow diabetic diet, not eating as much      Safety   Seat belts: Yes    Guns: No   Safe in relationships: Yes        Social Drivers of Corporate investment banker Strain: Low Risk   (10/24/2022)   Overall Financial Resource Strain (CARDIA)    Difficulty of Paying Living Expenses: Not very hard  Food Insecurity: No Food Insecurity (10/24/2022)   Hunger Vital Sign    Worried About Running Out of Food in the Last Year: Never true    Ran Out of Food in the Last Year: Never true  Transportation Needs: No Transportation Needs (10/24/2022)   PRAPARE - Administrator, Civil Service (Medical): No    Lack of Transportation (Non-Medical): No  Physical Activity: Unknown (10/24/2022)   Exercise Vital Sign    Days of Exercise per Week: 0 days    Minutes of Exercise per Session: Not on file  Stress: No Stress Concern Present (10/24/2022)   [employer] of Occupational Health - Occupational Stress Questionnaire    Feeling of Stress : Only a little  Social Connections: Unknown (10/24/2022)   Social Connection and Isolation Panel [NHANES]    Frequency of Communication with Friends and Family: More than three times a week    Frequency of Social Gatherings with Friends and Family: More than three times a week    Attends Religious Services: More than 4 times per year    Active Member of [employer] or Organizations: No    Attends Engineer, structural: Not on file    Marital Status: Patient declined  Intimate Partner Violence: Not on file     Outpatient Medications Prior to Visit  Medication Sig Dispense Refill   acetaminophen (TYLENOL) 325 MG tablet Take 650 mg by mouth every 6 (six) hours as needed.     Alum Hydroxide-Mag Carbonate (GAVISCON EXTRA STRENGTH PO) Take 1 tablet by mouth 4 (four) times daily as needed.     Ascorbic Acid (VITAMIN C) 500 MG CAPS Take by mouth.     benzoyl peroxide 5 % external liquid Apply topically 2 (two) times daily. 142 g 12   cholecalciferol (VITAMIN D3) 25 MCG (1000 UT) tablet Take 1,000 Units by mouth daily.     Dulaglutide (TRULICITY) 0.75 MG/0.5ML SOAJ Inject 0.75 mg into the skin once a week. 12 mL 1   folic acid (FOLVITE) 1 MG  tablet Take 2 tablets by mouth once daily 60 tablet 0   Lancets (ONETOUCH ULTRASOFT) lancets 1 each by Other route  daily at 12 noon. Use to check Glucose once daily as directed. 100 each 1   Milk Thistle 1000 MG CAPS Take 1 capsule by mouth daily.     mometasone (ELOCON) 0.1 % cream APPLY  CREAM TOPICALLY ONCE DAILY 45 g 0   Multiple Vitamin (MULTIVITAMIN PO) Take by mouth.     Multiple Vitamins-Minerals (ZINC PO) Take by mouth.     ONETOUCH ULTRA test strip USE  STRIP TO CHECK GLUCOSE ONCE TO TWICE DAILY AS DIRECTED 100 each 0   vitamin B-12 (CYANOCOBALAMIN) 500 MCG tablet Take 500 mcg by mouth daily.     No facility-administered medications prior to visit.    Allergies  Allergen Reactions   Metformin And Related Itching    Heart palpitation   Sulfa Antibiotics Itching and Other (See Comments)    ROS Review of Systems Negative unless indicated in HPI.    Objective:    Physical Exam Constitutional:      Appearance: Normal appearance.  HENT:     Right Ear: Tympanic membrane normal. Tympanic membrane is not erythematous.     Left Ear: Tympanic membrane normal. Tympanic membrane is not erythematous.     Nose:     Right Turbinates: Not enlarged.     Left Turbinates: Not enlarged.     Right Sinus: No maxillary sinus tenderness or frontal sinus tenderness.     Left Sinus: No maxillary sinus tenderness or frontal sinus tenderness.     Mouth/Throat:     Mouth: Mucous membranes are moist.     Pharynx: No pharyngeal swelling, oropharyngeal exudate or posterior oropharyngeal erythema.     Tonsils: No tonsillar exudate.  Cardiovascular:     Rate and Rhythm: Normal rate and regular rhythm.  Pulmonary:     Effort: Pulmonary effort is normal.     Breath sounds: No stridor. Wheezing present.  Neurological:     General: No focal deficit present.     Mental Status: She is alert and oriented to person, place, and time. Mental status is at baseline.  Psychiatric:        Mood and Affect:  Mood normal.        Behavior: Behavior normal.        Thought Content: Thought content normal.        Judgment: Judgment normal.     BP 128/76   Pulse 80   Temp 98.8 F (37.1 C)   Ht 5\' 2"  (1.575 m)   Wt 182 lb (82.6 kg)   LMP 04/30/2012 Comment: not sexually active  SpO2 97%   BMI 33.29 kg/m  Wt Readings from Last 3 Encounters:  04/20/23 182 lb (82.6 kg)  01/31/23 183 lb (83 kg)  12/06/22 183 lb 2 oz (83.1 kg)     Health Maintenance  Topic Date Due   Pneumococcal Vaccine 32-81 Years old (1 of 2 - PCV) Never done   DTaP/Tdap/Td (2 - Tdap) 08/03/2021   Colonoscopy  06/16/2022   OPHTHALMOLOGY EXAM  04/08/2023   INFLUENZA VACCINE  06/05/2023 (Originally 10/06/2022)   HEMOGLOBIN A1C  07/31/2023   Diabetic kidney evaluation - Urine ACR  10/24/2023   Diabetic kidney evaluation - eGFR measurement  01/31/2024   FOOT EXAM  01/31/2024   MAMMOGRAM  06/08/2024   Cervical Cancer Screening (HPV/Pap Cotest)  07/29/2027   Hepatitis C Screening  Completed   HIV Screening  Completed   HPV VACCINES  Aged Out   COVID-19 Vaccine  Discontinued   Zoster Vaccines- Shingrix  Discontinued    There are no preventive care reminders to display for this patient.  Lab Results  Component Value Date   TSH 1.87 09/30/2022   Lab Results  Component Value Date   WBC 4.8 03/03/2023   HGB 12.8 03/03/2023   HCT 38.8 03/03/2023   MCV 90.9 03/03/2023   PLT 137 (L) 03/03/2023   Lab Results  Component Value Date   NA 138 01/31/2023   K 3.9 01/31/2023   CO2 29 01/31/2023   GLUCOSE 132 (H) 01/31/2023   BUN 8 01/31/2023   CREATININE 0.73 01/31/2023   BILITOT 1.0 01/31/2023   ALKPHOS 107 01/31/2023   AST 22 01/31/2023   ALT 16 01/31/2023   PROT 7.1 01/31/2023   ALBUMIN 4.4 01/31/2023   CALCIUM 9.7 01/31/2023   ANIONGAP 6 11/03/2022   EGFR 94 09/10/2021   GFR 87.54 01/31/2023   Lab Results  Component Value Date   CHOL 221 (H) 09/30/2022   Lab Results  Component Value Date   HDL 78.20  09/30/2022   Lab Results  Component Value Date   LDLCALC 130 (H) 09/30/2022   Lab Results  Component Value Date   TRIG 61.0 09/30/2022   Lab Results  Component Value Date   CHOLHDL 3 09/30/2022   Lab Results  Component Value Date   HGBA1C 6.7 (H) 01/31/2023      Assessment & Plan:  Lower respiratory infection Assessment & Plan: Symptoms of cough, wheezing, phlegm,fatigue and fever. Negative flu and home COVID test.  Chest x-ray ordered to rule out pneumonia or any other underlying condition.  Will treat with Augmentin and prednisone.  Advised patient to probiotics with antibiotic.    Fever, unspecified fever cause -     POCT Influenza A/B -     DG Chest 2 View; Future  Cough, unspecified type -     POCT Influenza A/B -     DG Chest 2 View; Future -     Benzonatate; Take 1 capsule (100 mg total) by mouth 3 (three) times daily as needed.  Dispense: 30 capsule; Refill: 0  Other orders -     Amoxicillin-Pot Clavulanate; Take 1 tablet by mouth 2 (two) times daily.  Dispense: 20 tablet; Refill: 0 -     predniSONE; Take 2 tablets (40 mg total) by mouth daily with breakfast for 5 days.  Dispense: 10 tablet; Refill: 0    Follow-up: No follow-ups on file.   Kara Dies, NP

## 2023-04-20 NOTE — Telephone Encounter (Signed)
Left message to call the office back regarding the chest xray results. Okay to give the chest xray results.

## 2023-04-20 NOTE — Patient Instructions (Signed)
Please go to [facility] [address], [place], [place] [address] [phone] for X-ray.  Please  take plain Mucinex OTC for congestion. Incresae fluid intake and rest.

## 2023-04-20 NOTE — Progress Notes (Signed)
Please call pt: The chest x-ray is clear.  No infection or mass seen on the x-ray.

## 2023-04-21 ENCOUNTER — Telehealth: Payer: Self-pay | Admitting: *Deleted

## 2023-04-21 ENCOUNTER — Telehealth: Payer: Self-pay

## 2023-04-21 ENCOUNTER — Encounter: Payer: Self-pay | Admitting: Hematology & Oncology

## 2023-04-21 NOTE — Telephone Encounter (Signed)
Message received from patient wanting to know if it is ok per Dr. Myna Hidalgo if she takes Prednisone 20 mg once a day for 5 days for wheezing.  Dr. Myna Hidalgo notified.  Call placed back to patient and pt notified per order of Dr. Myna Hidalgo that he is ok with pt taking Prednisone 20 mg daily for five days. Pt is appreciative of call back and has no questions at this time.

## 2023-04-21 NOTE — Telephone Encounter (Signed)
Sent question about medication to [facility] awaiting a response.

## 2023-04-21 NOTE — Telephone Encounter (Signed)
Copied from CRM [id number]. Topic: Clinical - Medication Question >> Apr 21, 2023 10:23 AM Kathryne Eriksson wrote: Patient called in wanting a follow up about a medication she recently had a question about. Patient's call back number is [phone]

## 2023-04-25 ENCOUNTER — Telehealth: Payer: Self-pay | Admitting: Family Medicine

## 2023-04-25 NOTE — Telephone Encounter (Signed)
Do to winter weather, appointment on 04/25/2022 will be a virtual with Dr Clent Ridges. If unable to do a virtual please  reschedule to another day.  Thank you.

## 2023-04-25 NOTE — Telephone Encounter (Signed)
Tried calling pt to get her scheduled for the early slots so Dr. Clent Ridges could be done by the afternoon. So if the pt call back just give the pt a sooner appt  on 04/26/23 thanks.

## 2023-04-26 ENCOUNTER — Ambulatory Visit: Payer: Federal, State, Local not specified - PPO | Admitting: Family Medicine

## 2023-04-26 DIAGNOSIS — J22 Unspecified acute lower respiratory infection: Secondary | ICD-10-CM | POA: Insufficient documentation

## 2023-04-26 NOTE — Telephone Encounter (Signed)
Pt was rescheduled to 05/29/23

## 2023-04-27 ENCOUNTER — Other Ambulatory Visit: Payer: Self-pay

## 2023-04-27 ENCOUNTER — Inpatient Hospital Stay (HOSPITAL_BASED_OUTPATIENT_CLINIC_OR_DEPARTMENT_OTHER): Payer: Federal, State, Local not specified - PPO | Admitting: Hematology & Oncology

## 2023-04-27 ENCOUNTER — Inpatient Hospital Stay: Payer: Federal, State, Local not specified - PPO | Attending: Hematology & Oncology

## 2023-04-27 ENCOUNTER — Encounter: Payer: Self-pay | Admitting: Hematology & Oncology

## 2023-04-27 VITALS — BP 124/62 | HR 70 | Temp 98.7°F | Resp 18 | Ht 62.0 in | Wt 181.0 lb

## 2023-04-27 DIAGNOSIS — M3119 Other thrombotic microangiopathy: Secondary | ICD-10-CM | POA: Insufficient documentation

## 2023-04-27 DIAGNOSIS — D72819 Decreased white blood cell count, unspecified: Secondary | ICD-10-CM | POA: Insufficient documentation

## 2023-04-27 DIAGNOSIS — E119 Type 2 diabetes mellitus without complications: Secondary | ICD-10-CM | POA: Diagnosis not present

## 2023-04-27 DIAGNOSIS — K7581 Nonalcoholic steatohepatitis (NASH): Secondary | ICD-10-CM | POA: Diagnosis not present

## 2023-04-27 LAB — CMP (CANCER CENTER ONLY)
ALT: 16 U/L (ref 0–44)
AST: 19 U/L (ref 15–41)
Albumin: 4.1 g/dL (ref 3.5–5.0)
Alkaline Phosphatase: 73 U/L (ref 38–126)
Anion gap: 7 (ref 5–15)
BUN: 10 mg/dL (ref 8–23)
CO2: 28 mmol/L (ref 22–32)
Calcium: 10.5 mg/dL — ABNORMAL HIGH (ref 8.9–10.3)
Chloride: 100 mmol/L (ref 98–111)
Creatinine: 0.91 mg/dL (ref 0.44–1.00)
GFR, Estimated: >60 mL/min (ref >60)
Glucose, Bld: 209 mg/dL — ABNORMAL HIGH (ref 70–99)
Potassium: 3.5 mmol/L (ref 3.5–5.1)
Sodium: 135 mmol/L (ref 135–145)
Total Bilirubin: 0.9 mg/dL (ref 0.0–1.2)
Total Protein: 7 g/dL (ref 6.5–8.1)

## 2023-04-27 LAB — RETICULOCYTES
Immature Retic Fract: 15.4 % (ref 2.3–15.9)
RBC.: 4.26 MIL/uL (ref 3.87–5.11)
Retic Count, Absolute: 74.6 10*3/uL (ref 19.0–186.0)
Retic Ct Pct: 1.8 % (ref 0.4–3.1)

## 2023-04-27 LAB — CBC WITH DIFFERENTIAL (CANCER CENTER ONLY)
Abs Immature Granulocytes: 0.04 10*3/uL (ref 0.00–0.07)
Basophils Absolute: 0 10*3/uL (ref 0.0–0.1)
Basophils Relative: 0 %
Eosinophils Absolute: 0.1 10*3/uL (ref 0.0–0.5)
Eosinophils Relative: 1 %
HCT: 37.5 % (ref 36.0–46.0)
Hemoglobin: 13 g/dL (ref 12.0–15.0)
Immature Granulocytes: 1 %
Lymphocytes Relative: 42 %
Lymphs Abs: 2.7 10*3/uL (ref 0.7–4.0)
MCH: 30.5 pg (ref 26.0–34.0)
MCHC: 34.7 g/dL (ref 30.0–36.0)
MCV: 88 fL (ref 80.0–100.0)
Monocytes Absolute: 0.6 10*3/uL (ref 0.1–1.0)
Monocytes Relative: 10 %
Neutro Abs: 3.1 10*3/uL (ref 1.7–7.7)
Neutrophils Relative %: 46 %
Platelet Count: 172 10*3/uL (ref 150–400)
RBC: 4.26 MIL/uL (ref 3.87–5.11)
RDW: 12.6 % (ref 11.5–15.5)
WBC Count: 6.5 10*3/uL (ref 4.0–10.5)
nRBC: 0 % (ref 0.0–0.2)

## 2023-04-27 LAB — SAVE SMEAR(SSMR), FOR PROVIDER SLIDE REVIEW

## 2023-04-27 LAB — LACTATE DEHYDROGENASE: LDH: 145 U/L (ref 98–192)

## 2023-04-27 NOTE — Assessment & Plan Note (Signed)
Symptoms of cough, wheezing, phlegm,fatigue and fever. Negative flu and home COVID test.  Chest x-ray ordered to rule out pneumonia or any other underlying condition.  Will treat with Augmentin and prednisone.  Advised patient to probiotics with antibiotic.

## 2023-04-27 NOTE — Progress Notes (Signed)
Hematology and Oncology Follow Up Visit  Vicki [name] [id number] November 03, 1960 63 y.o. 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  We last saw her 6 months ago.  As always, main problem has been her diabetes.  Now, she seems to be some urinary incontinence.  I think that she is  To see her gynecologist.  She says that she lost her gynecologist when she moved.  However, I assured her that the group that her gynecologist was with can get her seen by another 1.  As far as the TTP is concerned, this really is not a problem.  Everything looks fine with TTP.  Again, her blood sugars are the main issue.  She has had no issues with fever.  She has had no problems with nausea or vomiting.  There is been no change in bowel or bladder habits although she does have the urinary incontinence.  There is no rashes.  She has had no tingling in the hands or feet.  Overall, I would have to say that her performance status is probably ECOG 1.    Medications:  Current Outpatient Medications:    Methylcobalamin 5000 MCG SUBL, Place 5,000 mcg under the tongue daily., Disp: , Rfl:    acetaminophen (TYLENOL) 325 MG tablet, Take 650 mg by mouth every 6 (six) hours as needed., Disp: , Rfl:    Alum Hydroxide-Mag Carbonate (GAVISCON EXTRA STRENGTH PO), Take 1 tablet by mouth 4 (four) times daily as needed., Disp: , Rfl:    amoxicillin-clavulanate (AUGMENTIN) 875-125 MG tablet, Take 1 tablet by mouth 2 (two) times daily., Disp: 20 tablet, Rfl: 0   Ascorbic Acid (VITAMIN C) 500 MG CAPS, Take by mouth., Disp: , Rfl:    benzonatate (TESSALON PERLES) 100 MG capsule, Take 1 capsule (100 mg total) by mouth 3 (three) times daily as needed., Disp: 30 capsule, Rfl: 0    benzoyl peroxide 5 % external liquid, Apply topically 2 (two) times daily., Disp: 142 g, Rfl: 12   cholecalciferol (VITAMIN D3) 25 MCG (1000 UT) tablet, Take 1,000 Units by mouth daily., Disp: , Rfl:    Dulaglutide (TRULICITY) 0.75 MG/0.5ML SOAJ, Inject 0.75 mg into the skin once a week., Disp: 12 mL, Rfl: 1   folic acid (FOLVITE) 1 MG tablet, Take 2 tablets by mouth once daily, Disp: 60 tablet, Rfl: 0   Lancets (ONETOUCH ULTRASOFT) lancets, 1 each by Other route daily at 12 noon. Use to check Glucose once daily as directed., Disp: 100 each, Rfl: 1   Milk Thistle 1000 MG CAPS, Take 1 capsule by mouth daily., Disp: , Rfl:    mometasone (ELOCON) 0.1 % cream, APPLY  CREAM TOPICALLY ONCE DAILY, Disp: 45 g, Rfl: 0   Multiple Vitamin (MULTIVITAMIN PO), Take by mouth., Disp: , Rfl:    Multiple Vitamins-Minerals (ZINC PO), Take by mouth., Disp: , Rfl:    ONETOUCH ULTRA test strip, USE  STRIP TO CHECK GLUCOSE ONCE TO TWICE DAILY AS DIRECTED, Disp: 100 each, Rfl: 0  Allergies:  Allergies  Allergen Reactions   Metformin And Related Itching    Heart palpitation   Sulfa Antibiotics Itching and Other (See Comments)    Past Medical History, Surgical history, Social history, and Family  History were reviewed and updated.  Review of Systems: Review of Systems  Constitutional: Negative.   HENT:  Negative.    Eyes: Negative.   Respiratory: Negative.    Cardiovascular:  Positive for leg swelling.  Gastrointestinal: Negative.   Endocrine: Negative.   Genitourinary: Negative.    Musculoskeletal:  Positive for arthralgias and myalgias.  Skin: Negative.   Neurological: Negative.   Hematological: Negative.   Psychiatric/Behavioral: Negative.      Physical Exam:  height is 5\' 2"  (1.575 m) and weight is 181 lb (82.1 kg). Her oral temperature is 98.7 F (37.1 C). Her blood pressure is 124/62 and her pulse is 70. Her respiration is 18 and oxygen saturation is 97%.   Wt Readings from Last 3 Encounters:   04/27/23 181 lb (82.1 kg)  04/20/23 182 lb (82.6 kg)  01/31/23 183 lb (83 kg)    Physical Exam Vitals reviewed.  HENT:     Head: Normocephalic and atraumatic.  Eyes:     Pupils: Pupils are equal, round, and reactive to light.  Cardiovascular:     Rate and Rhythm: Normal rate and regular rhythm.     Heart sounds: Normal heart sounds.  Pulmonary:     Effort: Pulmonary effort is normal.     Breath sounds: Normal breath sounds.  Abdominal:     General: Bowel sounds are normal.     Palpations: Abdomen is soft.  Musculoskeletal:        General: No tenderness or deformity. Normal range of motion.     Cervical back: Normal range of motion.     Comments: Extremities shows some 1+ edema in her lower legs.  She has good range of motion of her joints.  She has good strength in upper and lower extremities.  Lymphadenopathy:     Cervical: No cervical adenopathy.  Skin:    General: Skin is warm and dry.     Findings: No erythema or rash.  Neurological:     Mental Status: She is alert and oriented to person, place, and time.  Psychiatric:        Behavior: Behavior normal.        Thought Content: Thought content normal.        Judgment: Judgment normal.      Lab Results  Component Value Date   WBC 6.5 04/27/2023   HGB 13.0 04/27/2023   HCT 37.5 04/27/2023   MCV 88.0 04/27/2023   PLT 172 04/27/2023     Chemistry      Component Value Date/Time   NA 138 01/31/2023 1203   NA 143 09/10/2021 1601   K 3.9 01/31/2023 1203   CL 105 01/31/2023 1203   CO2 29 01/31/2023 1203   BUN 8 01/31/2023 1203   BUN 9 09/10/2021 1601   CREATININE 0.73 01/31/2023 1203   CREATININE 0.72 11/03/2022 1338   CREATININE 0.70 09/25/2015 1504      Component Value Date/Time   CALCIUM 9.7 01/31/2023 1203   ALKPHOS 107 01/31/2023 1203   AST 22 01/31/2023 1203   AST 21 11/03/2022 1338   ALT 16 01/31/2023 1203   ALT 14 11/03/2022 1338   BILITOT 1.0 01/31/2023 1203   BILITOT 0.9 11/03/2022 1338       Impression and Plan: Vicki [name] is a 63 year old African-American female.  She has acquired TTP.  She had relapsed.  She responded well to treatment with plasma exchange and Rituxan.  She is now has been off treatment now for 4 years.  Again, I do not see any problems with respect to the TTP.  I did get her blood smear.  I do not see anything that looks like active TTP.  I really do not see any schistocytes.  Hopefully, the gynecologist will be able to help with her urinary issue.  It sounds like she may have a drop bladder.  We will still plan to get her back in 6 months.  I think this is very reasonable.     Josph Macho, MD 2/20/20252:20 PM

## 2023-04-29 LAB — ADAMTS13 ACTIVITY: Adamts 13 Activity: >100 % (ref >66.8)

## 2023-04-29 LAB — ADAMTS13 ACTIVITY REFLEX

## 2023-05-29 ENCOUNTER — Encounter: Payer: Self-pay | Admitting: Family Medicine

## 2023-05-29 ENCOUNTER — Ambulatory Visit: Payer: Federal, State, Local not specified - PPO | Admitting: Family Medicine

## 2023-05-29 VITALS — BP 136/82 | HR 80 | Temp 98.0°F | Resp 20 | Ht 62.0 in | Wt 181.5 lb

## 2023-05-29 DIAGNOSIS — Z1211 Encounter for screening for malignant neoplasm of colon: Secondary | ICD-10-CM

## 2023-05-29 DIAGNOSIS — E1169 Type 2 diabetes mellitus with other specified complication: Secondary | ICD-10-CM

## 2023-05-29 DIAGNOSIS — I152 Hypertension secondary to endocrine disorders: Secondary | ICD-10-CM

## 2023-05-29 DIAGNOSIS — E118 Type 2 diabetes mellitus with unspecified complications: Secondary | ICD-10-CM | POA: Diagnosis not present

## 2023-05-29 DIAGNOSIS — E559 Vitamin D deficiency, unspecified: Secondary | ICD-10-CM | POA: Diagnosis not present

## 2023-05-29 DIAGNOSIS — M3119 Other thrombotic microangiopathy: Secondary | ICD-10-CM

## 2023-05-29 DIAGNOSIS — Z7985 Long-term (current) use of injectable non-insulin antidiabetic drugs: Secondary | ICD-10-CM

## 2023-05-29 DIAGNOSIS — E1159 Type 2 diabetes mellitus with other circulatory complications: Secondary | ICD-10-CM | POA: Diagnosis not present

## 2023-05-29 DIAGNOSIS — E538 Deficiency of other specified B group vitamins: Secondary | ICD-10-CM

## 2023-05-29 DIAGNOSIS — E785 Hyperlipidemia, unspecified: Secondary | ICD-10-CM

## 2023-05-29 NOTE — Patient Instructions (Signed)
 It was a pleasure meeting you today. Thank you for allowing me to take part in your health care.  Our goals for today as we discussed include:  Schedule fasting lab appointment  Follow up in 3 months    This is a list of the screening recommended for you and due dates:  Health Maintenance  Topic Date Due   DTaP/Tdap/Td vaccine (6 - Tdap) 08/03/2021   Colon Cancer Screening  06/16/2022   Eye exam for diabetics  04/08/2023   Flu Shot  06/05/2023*   Pneumococcal Vaccination (1 of 2 - PCV) 05/28/2024*   Hemoglobin A1C  07/31/2023   Yearly kidney health urinalysis for diabetes  10/24/2023   Complete foot exam   01/31/2024   Yearly kidney function blood test for diabetes  04/26/2024   Mammogram  06/08/2024   Pap with HPV screening  07/29/2027   Hepatitis C Screening  Completed   HIV Screening  Completed   HPV Vaccine  Aged Out   COVID-19 Vaccine  Discontinued   Zoster (Shingles) Vaccine  Discontinued  *Topic was postponed. The date shown is not the original due date.      If you have any questions or concerns, please do not hesitate to call the office at [phone].  I look forward to our next visit and until then take care and stay safe.  Regards,   Dana Allan, MD   [facility]

## 2023-05-29 NOTE — Progress Notes (Signed)
 SUBJECTIVE:   Chief Complaint  Patient presents with   Medical Management of Chronic Issues    6 month follow up   HPI Presents to clinic for follow up chronic disease management  Discussed the use of AI scribe software for clinical note transcription with the patient, who gave verbal consent to proceed.  History of Present Illness Vicki [name] "Vicki [name]" is a 63 year old female with type 2 diabetes mellitus who presents for a follow-up visit.  Her hemoglobin A1c has been stable at 6.7% since November. She takes Trulicity 0.75 mg every other week due to stomach discomfort. She experiences stomach pain with this medication and has not tried Ozempic. She does not follow a diet or exercise regimen.  She aims to maintain her blood pressure below 140/90 mmHg. She is not on antihypertensive medications.  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 Her last glucose level was elevated, but she did not fast before the test.  She experiences occasional constipation, with a recent episode over the past weekend. Her diet includes fruits like apples, oranges, and bananas, and she drinks fruit juices, including orange and beet juice.  She takes magnesium supplements for energy and cramps and is concerned about the dosage. She experiences occasional palpitations and consumes caffeine through green tea and coffee.    PERTINENT PMH / PSH: As above  OBJECTIVE:  BP 136/82   Pulse 80   Temp 98 F (36.7 C)   Resp 20   Ht 5\' 2"  (1.575 m)   Wt 181 lb 8 oz (82.3 kg)   LMP 04/30/2012 Comment: not sexually active  SpO2 98%   BMI 33.20 kg/m    Physical Exam Vitals reviewed.  Constitutional:      General: She is not in acute distress.    Appearance: Normal appearance. She is obese. She is not ill-appearing, toxic-appearing or diaphoretic.  Eyes:     General:        Right eye: No discharge.        Left eye: No discharge.      Conjunctiva/sclera: Conjunctivae normal.  Cardiovascular:     Rate and Rhythm: Normal rate and regular rhythm.     Heart sounds: Normal heart sounds.  Pulmonary:     Effort: Pulmonary effort is normal.     Breath sounds: Normal breath sounds.  Abdominal:     General: Bowel sounds are normal.  Musculoskeletal:        General: Normal range of motion.  Skin:    General: Skin is warm and dry.  Neurological:     General: No focal deficit present.     Mental Status: She is alert and oriented to person, place, and time. Mental status is at baseline.  Psychiatric:        Mood and Affect: Mood normal.        Behavior: Behavior normal.        Thought Content: Thought content normal.        Judgment: Judgment normal.           05/29/2023   11:27 AM 04/20/2023   12:03 PM 12/06/2022    2:33 PM 10/24/2022    8:55 AM 09/27/2022    3:11 PM  Depression screen PHQ 2/9  Decreased Interest 0 0 0 0 1  Down, Depressed, Hopeless 0 0 1 0 1  PHQ - 2 Score 0 0 1 0 2  Altered sleeping 2 0 2 2  1  Tired, decreased energy 2 0 1 3 1   Change in appetite 1 0 0 0 2  Feeling bad or failure about yourself  0 0 0 0 1  Trouble concentrating 0 0 0 0 0  Moving slowly or fidgety/restless 0 0 0 0 0  Suicidal thoughts 0 0 0 0 0  PHQ-9 Score 5 0 4 5 7   Difficult doing work/chores Somewhat difficult Not difficult at all Not difficult at all Somewhat difficult Not difficult at all      05/29/2023   11:27 AM 04/20/2023   12:03 PM 12/06/2022    2:33 PM 10/24/2022    8:55 AM  GAD 7 : Generalized Anxiety Score  Nervous, Anxious, on Edge 0 0 1 1  Control/stop worrying 0 0 0 0  Worry too much - different things 0 0 1 1  Trouble relaxing 0 0 0 0  Restless 0 0 0 0  Easily annoyed or irritable 1 0 1 1  Afraid - awful might happen 0 0 1 1  Total GAD 7 Score 1 0 4 4  Anxiety Difficulty Not difficult at all Not difficult at all Not difficult at all Not difficult at all    ASSESSMENT/PLAN:  Type 2 diabetes mellitus  with complications (HCC) Assessment & Plan: Type 2 Diabetes Mellitus with well-managed HbA1c of 6.7%. She experiences gastrointestinal discomfort with Trulicity, leading to non-compliance. Discussed Rybelsus as an oral alternative. Emphasized diet and exercise. Highlighted risks of uncontrolled diabetes. - Continue Trulicity 0.75 mg weekly as tolerated. - Emphasize diet and exercise. - Provide list of low glycemic index foods. - Consider Rybelsus if switching from Trulicity. Patient prefers to stay on current dose of Trulicity   Hypertension associated with diabetes [facility]) Assessment & Plan: Blood pressure managed without medication, targeting <140/90 mmHg.  Future treatment with ARB considered if blood pressure increases. - Monitor blood pressure. - Consider antihypertensive medication if blood pressure increases.  Orders: -     Comprehensive metabolic panel with GFR; Future  Hyperlipidemia associated with type 2 diabetes mellitus (HCC) Assessment & Plan: Chronic.   Declined statin Continues to want to work on lifestyle  Orders: -     Lipid panel; Future  Vitamin D deficiency Assessment & Plan: Check Vitamin D level  Orders: -     VITAMIN D 25 Hydroxy (Vit-D Deficiency, Fractures); Future  Vitamin B12 deficiency Assessment & Plan: Check Vitamin B 12 level  Orders: -     Vitamin B12; Future  Hypomagnesemia Assessment & Plan: Patient requesting magnesium levels as she has been taking supplements.  Orders: -     Magnesium; Future  Colon cancer screening -     Cologuard  TTP (thrombotic thrombocytopenic purpura) (HCC) Assessment & Plan: Under hematologist care with biannual follow-ups. No acute issues addressed this visit. Follows with Dr Trey Sailors    PDMP reviewed  Return in about 3 months (around 08/29/2023) for PCP.  Dana Allan, MD

## 2023-05-30 ENCOUNTER — Other Ambulatory Visit (INDEPENDENT_AMBULATORY_CARE_PROVIDER_SITE_OTHER)

## 2023-05-30 DIAGNOSIS — E1169 Type 2 diabetes mellitus with other specified complication: Secondary | ICD-10-CM | POA: Diagnosis not present

## 2023-05-30 DIAGNOSIS — E785 Hyperlipidemia, unspecified: Secondary | ICD-10-CM

## 2023-05-30 DIAGNOSIS — E538 Deficiency of other specified B group vitamins: Secondary | ICD-10-CM | POA: Diagnosis not present

## 2023-05-30 DIAGNOSIS — E559 Vitamin D deficiency, unspecified: Secondary | ICD-10-CM | POA: Diagnosis not present

## 2023-05-30 DIAGNOSIS — E1159 Type 2 diabetes mellitus with other circulatory complications: Secondary | ICD-10-CM | POA: Diagnosis not present

## 2023-05-30 DIAGNOSIS — I152 Hypertension secondary to endocrine disorders: Secondary | ICD-10-CM

## 2023-05-30 LAB — LIPID PANEL
Cholesterol: 208 mg/dL — ABNORMAL HIGH (ref 0–200)
HDL: 81.2 mg/dL (ref >39.00)
LDL Cholesterol: 117 mg/dL — ABNORMAL HIGH (ref 0–99)
NonHDL: 127.19
Total CHOL/HDL Ratio: 3
Triglycerides: 50 mg/dL (ref 0.0–149.0)
VLDL: 10 mg/dL (ref 0.0–40.0)

## 2023-05-30 LAB — VITAMIN D 25 HYDROXY (VIT D DEFICIENCY, FRACTURES): VITD: 47.33 ng/mL (ref 30.00–100.00)

## 2023-05-30 LAB — MAGNESIUM: Magnesium: 1.9 mg/dL (ref 1.5–2.5)

## 2023-05-30 LAB — COMPREHENSIVE METABOLIC PANEL
ALT: 13 U/L (ref 0–35)
AST: 19 U/L (ref 0–37)
Albumin: 4.2 g/dL (ref 3.5–5.2)
Alkaline Phosphatase: 103 U/L (ref 39–117)
BUN: 10 mg/dL (ref 6–23)
CO2: 30 meq/L (ref 19–32)
Calcium: 9.8 mg/dL (ref 8.4–10.5)
Chloride: 106 meq/L (ref 96–112)
Creatinine, Ser: 0.75 mg/dL (ref 0.40–1.20)
GFR: 84.55 mL/min (ref >60.00)
Glucose, Bld: 170 mg/dL — ABNORMAL HIGH (ref 70–99)
Potassium: 3.8 meq/L (ref 3.5–5.1)
Sodium: 141 meq/L (ref 135–145)
Total Bilirubin: 0.7 mg/dL (ref 0.2–1.2)
Total Protein: 7.3 g/dL (ref 6.0–8.3)

## 2023-05-30 LAB — VITAMIN B12: Vitamin B-12: 1325 pg/mL — ABNORMAL HIGH (ref 211–911)

## 2023-06-04 ENCOUNTER — Encounter: Payer: Self-pay | Admitting: Family Medicine

## 2023-06-04 NOTE — Assessment & Plan Note (Signed)
 Type 2 Diabetes Mellitus with well-managed HbA1c of 6.7%. She experiences gastrointestinal discomfort with Trulicity, leading to non-compliance. Discussed Rybelsus as an oral alternative. Emphasized diet and exercise. Highlighted risks of uncontrolled diabetes. - Continue Trulicity 0.75 mg weekly as tolerated. - Emphasize diet and exercise. - Provide list of low glycemic index foods. - Consider Rybelsus if switching from Trulicity. Patient prefers to stay on current dose of Trulicity

## 2023-06-04 NOTE — Assessment & Plan Note (Signed)
 Patient requesting magnesium levels as she has been taking supplements.

## 2023-06-04 NOTE — Assessment & Plan Note (Signed)
 Check Vitamin D level

## 2023-06-04 NOTE — Assessment & Plan Note (Signed)
 Chronic.   Declined statin Continues to want to work on lifestyle

## 2023-06-04 NOTE — Assessment & Plan Note (Signed)
 Under hematologist care with biannual follow-ups. No acute issues addressed this visit. Follows with Dr Trey Sailors

## 2023-06-04 NOTE — Assessment & Plan Note (Signed)
 Check Vitamin B 12 level

## 2023-06-04 NOTE — Assessment & Plan Note (Signed)
 Blood pressure managed without medication, targeting <140/90 mmHg.  Future treatment with ARB considered if blood pressure increases. - Monitor blood pressure. - Consider antihypertensive medication if blood pressure increases.

## 2023-06-05 ENCOUNTER — Telehealth: Payer: Self-pay

## 2023-06-05 DIAGNOSIS — M5451 Vertebrogenic low back pain: Secondary | ICD-10-CM | POA: Diagnosis not present

## 2023-06-05 DIAGNOSIS — M48061 Spinal stenosis, lumbar region without neurogenic claudication: Secondary | ICD-10-CM | POA: Diagnosis not present

## 2023-06-05 NOTE — Telephone Encounter (Signed)
 Copied from CRM [id number]. Topic: Clinical - Lab/Test Results >> Jun 05, 2023  9:46 AM [place] J wrote: Reason for CRM: Patient would like to know what her A1C level is and how that looks like. She would also like to know if it's safe to take prednisone that she received from her orthopedic provider and if it will raise her glucose levels.

## 2023-06-05 NOTE — Telephone Encounter (Signed)
 I do not see where an A1C was done on pt.

## 2023-06-06 ENCOUNTER — Telehealth: Payer: Self-pay

## 2023-06-06 DIAGNOSIS — K7460 Unspecified cirrhosis of liver: Secondary | ICD-10-CM | POA: Diagnosis not present

## 2023-06-06 DIAGNOSIS — K766 Portal hypertension: Secondary | ICD-10-CM | POA: Diagnosis not present

## 2023-06-06 DIAGNOSIS — K7581 Nonalcoholic steatohepatitis (NASH): Secondary | ICD-10-CM | POA: Diagnosis not present

## 2023-06-06 DIAGNOSIS — D61818 Other pancytopenia: Secondary | ICD-10-CM | POA: Diagnosis not present

## 2023-06-06 DIAGNOSIS — E119 Type 2 diabetes mellitus without complications: Secondary | ICD-10-CM | POA: Diagnosis not present

## 2023-06-06 LAB — HEMOGLOBIN A1C: Hemoglobin A1C: 6.9

## 2023-06-06 NOTE — Telephone Encounter (Signed)
 Left message to return call to our office.  Okay to relay results to pt. Please document when pt is spoke to.

## 2023-06-06 NOTE — Telephone Encounter (Signed)
 Copied from CRM [id number]. Topic: Clinical - Lab/Test Results >> Jun 06, 2023 10:02 AM Almira Coaster wrote: Reason for CRM: Patient is returning a call she received to go over lab results, advised patient of Dr.Walsh message, patient stated she already viewed results on MyChart. She was wondering why her Hemoglobin A1C wasn't tested during her last visit and would like to know if Dr.Walsh can order the test for her.

## 2023-06-06 NOTE — Telephone Encounter (Signed)
-----   Message from Dana Allan sent at 06/04/2023  3:45 PM EDT ----- Total cholesterol was high at 208 (nml is <200) and LDL (bad) cholesterol was 161 which is also elevated (nml is <99).   Both have slightly improved.  You can decrease these numbers by: -limiting your intake of processed foods and foods high in saturated fats.  -increasing your physical activity -increasing your intake of vegetables, if not allergic, healthy fish (bluefin tuna, wild salmon, and sardines), whole grains and fiber rich foods .   -You can also use extra virgin olive oil instead of butter. -If you smoke, quitting can decrease LDL cholesterol and raise HDL cholesterol.  The 10-year ASCVD risk score (Arnett DK, et al., 2019) is: 16%  High risk of possible heart attack or stroke in 10 years.  Recommendation to start statin therapy.  If agreeable will send in Crestor 10 mg daily.   Vitamin B 12 high.  Recommend decreasing B 12 supplements to 1-2 times a week.  All other blood work acceptable.

## 2023-06-08 ENCOUNTER — Other Ambulatory Visit: Payer: Self-pay | Admitting: Hematology & Oncology

## 2023-06-08 DIAGNOSIS — M3119 Other thrombotic microangiopathy: Secondary | ICD-10-CM

## 2023-06-14 ENCOUNTER — Encounter: Payer: Self-pay | Admitting: Hematology & Oncology

## 2023-06-14 LAB — COLOGUARD: COLOGUARD: NEGATIVE

## 2023-06-28 ENCOUNTER — Ambulatory Visit: Admitting: Cardiovascular Disease

## 2023-07-03 ENCOUNTER — Encounter: Payer: Self-pay | Admitting: Cardiovascular Disease

## 2023-07-03 ENCOUNTER — Ambulatory Visit: Attending: Cardiovascular Disease | Admitting: Cardiovascular Disease

## 2023-07-03 VITALS — BP 140/80 | HR 73 | Ht 62.0 in | Wt 180.0 lb

## 2023-07-03 DIAGNOSIS — I10 Essential (primary) hypertension: Secondary | ICD-10-CM

## 2023-07-03 DIAGNOSIS — R011 Cardiac murmur, unspecified: Secondary | ICD-10-CM | POA: Diagnosis not present

## 2023-07-03 DIAGNOSIS — E782 Mixed hyperlipidemia: Secondary | ICD-10-CM

## 2023-07-03 NOTE — Progress Notes (Signed)
 07/03/2023 Vicki [name]   1960-03-12  [id number]  Primary Physician Valli Gaw, MD Primary Cardiologist: Avanell Leigh MD Bennye Bravo, [place]  HPI:  Vicki [name] is a 63 y.o.   moderately overweight single African-American female mother of one child who was a Research scientist (physical sciences) (retired in 2020) and was referred by Dr. Burdette Carolin for cardiac evaluation because of new onset palpitations. I last saw her in the office   09/10/2021. Factors include treated hypertension and diabetes. Her mother did have a myocardial infarction at age 7. She has never had a heart attack or stroke and denies chest pain or shortness of breath. She is in her perimenopause time window is followed by Dr. Lavoie. She drinks 2 cups of caffeinated beverages a day as well as alcohol on the weekends. She is not under a lot of stress recently. A 2-D echocardiogram was normal and an event monitor showed sinus rhythm sinus/sinus tachycardia and frequent PVCs. She had decreased her caffeine intake which improved her symptoms a year ago. However recently, she's noticed increased heart rate and palpitations are somewhat different quality than her previous symptoms. She also was diagnosed with NASH.   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  She is drinking less caffeine and alcohol since I saw her last her palpitations are much less frequent and noticeable.  She had some atypical chest pain when they removed her indwelling catheter but none since.   Since I saw her in the office 2 years ago she has remained stable.  Her palpitations are relatively few and far Curtis Uriarte tween since cutting back on her caffeine and alcohol.  She denies chest pain or shortness of breath.  Her coronary CTA performed 10/20/2021 revealed a coronary calcium  score 0 with mild nonobstructive CAD.   Current Meds  Medication Sig   Alum Hydroxide-Mag Carbonate (GAVISCON EXTRA STRENGTH PO) Take 1 tablet by mouth 4 (four) times  daily as needed.   benzoyl peroxide  5 % external liquid Apply topically 2 (two) times daily.   cholecalciferol (VITAMIN D3) 25 MCG (1000 UT) tablet Take 1,000 Units by mouth daily.   Dulaglutide  (TRULICITY ) 0.75 MG/0.5ML SOAJ Inject 0.75 mg into the skin once a week.   folic acid  (FOLVITE ) 1 MG tablet Take 2 tablets by mouth once daily   Lancets (ONETOUCH ULTRASOFT) lancets 1 each by Other route daily at 12 noon. Use to check Glucose once daily as directed.   Methylcobalamin 5000 MCG SUBL Place 5,000 mcg under the tongue daily.   Milk Thistle 1000 MG CAPS Take 1 capsule by mouth daily.   mometasone  (ELOCON ) 0.1 % cream APPLY  CREAM TOPICALLY ONCE DAILY   Multiple Vitamin (MULTIVITAMIN PO) Take by mouth.   ONETOUCH ULTRA test strip USE  STRIP TO CHECK GLUCOSE ONCE TO TWICE DAILY AS DIRECTED     Allergies  Allergen Reactions   Metformin And Related Itching    Heart palpitation   Sulfa Antibiotics Itching and Other (See Comments)    Social History   Socioeconomic History   Marital status: Single    Spouse name: Not on file   Number of children: 1   Years of education: Not on file   Highest education level: Some college, no degree  Occupational History    Employer: [employer]  Tobacco Use   Smoking status: Never   Smokeless tobacco: Never  Vaping Use   Vaping status: Never Used  Substance and Sexual Activity  Alcohol use: Yes    Comment: 2 a month   Drug use: No   Sexual activity: Not Currently    Partners: Male    Birth control/protection: Post-menopausal  Other Topics Concern   Not on file  Social History Narrative   02/26/19   From: the area   Living: lives with roommate - they get along   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      Family: Daughter - Higher education careers adviser - good relationship currently age 33       Enjoys: watch TV and read      Exercise: dancing, on her own   Diet: does not follow diabetic diet, not eating as much      Safety   Seat belts: Yes    Guns:  No   Safe in relationships: Yes        Social Drivers of Corporate investment banker Strain: Low Risk  (05/22/2023)   Overall Financial Resource Strain (CARDIA)    Difficulty of Paying Living Expenses: Not very hard  Food Insecurity: No Food Insecurity (05/22/2023)   Hunger Vital Sign    Worried About Running Out of Food in the Last Year: Never true    Ran Out of Food in the Last Year: Never true  Transportation Needs: No Transportation Needs (05/22/2023)   PRAPARE - Administrator, Civil Service (Medical): No    Lack of Transportation (Non-Medical): No  Physical Activity: Insufficiently Active (05/22/2023)   Exercise Vital Sign    Days of Exercise per Week: 1 day    Minutes of Exercise per Session: 30 min  Stress: No Stress Concern Present (05/22/2023)   [employer] of Occupational Health - Occupational Stress Questionnaire    Feeling of Stress : Only a little  Social Connections: Moderately Isolated (05/22/2023)   Social Connection and Isolation Panel [NHANES]    Frequency of Communication with Friends and Family: More than three times a week    Frequency of Social Gatherings with Friends and Family: More than three times a week    Attends Religious Services: More than 4 times per year    Active Member of [employer] or Organizations: No    Attends Engineer, structural: Not on file    Marital Status: Never married  Intimate Partner Violence: Not on file     Review of Systems: General: negative for chills, fever, night sweats or weight changes.  Cardiovascular: negative for chest pain, dyspnea on exertion, edema, orthopnea, palpitations, paroxysmal nocturnal dyspnea or shortness of breath Dermatological: negative for rash Respiratory: negative for cough or wheezing Urologic: negative for hematuria Abdominal: negative for nausea, vomiting, diarrhea, bright red blood per rectum, melena, or hematemesis Neurologic: negative for visual changes, syncope, or  dizziness All other systems reviewed and are otherwise negative except as noted above.    Blood pressure (!) 158/72, pulse 73, height 5\' 2"  (1.575 m), weight 180 lb (81.6 kg), last menstrual period 04/30/2012, SpO2 98%.  General appearance: alert and no distress Neck: no adenopathy, no carotid bruit, no JVD, supple, symmetrical, trachea midline, and thyroid  not enlarged, symmetric, no tenderness/mass/nodules Lungs: Soft outflow tract murmur Heart: Soft outflow tract murmur Extremities: extremities normal, atraumatic, no cyanosis or edema Pulses: 2+ and symmetric Skin: Skin color, texture, turgor normal. No rashes or lesions Neurologic: Grossly normal  EKG EKG Interpretation Date/Time:  Monday July 03 2023 16:09:08 EDT Ventricular Rate:  73 PR Interval:  150 QRS Duration:  70 QT  Interval:  392 QTC Calculation: 431 R Axis:   -34  Text Interpretation: Normal sinus rhythm Left axis deviation Low voltage QRS Possible Inferior infarct , age undetermined Cannot rule out Anterior infarct (cited on or before 25-Oct-2022) When compared with ECG of 25-Oct-2022 20:13, No significant change was found Confirmed by Lauro Portal [phone]) on 07/03/2023 4:10:43 PM    ASSESSMENT AND PLAN:   Cardiac murmur 2D echo was normal  Essential hypertension History of essential hypertension with blood pressure measured today 138/72.  She is not on any hypertensive medications.  Hyperlipidemia History of hyperlipidemia not on statin therapy with lipid profile performed 05/30/2023 revealing a total cholesterol of 208, LDL 117 HDL 81.  She does not wish to be on medications.  We talked about lifestyle modifications.  I am going to provide her with a heart healthy diet and refer her to [facility] wellness.  Morbid obesity (HCC) BMI 32.  Referral to [facility] diet wellness center.     Avanell Leigh MD FACP,FACC,FAHA, [facility] 07/03/2023 4:19 PM

## 2023-07-03 NOTE — Assessment & Plan Note (Signed)
 History of hyperlipidemia not on statin therapy with lipid profile performed 05/30/2023 revealing a total cholesterol of 208, LDL 117 HDL 81.  She does not wish to be on medications.  We talked about lifestyle modifications.  I am going to provide her with a heart healthy diet and refer her to [facility] wellness.

## 2023-07-03 NOTE — Patient Instructions (Addendum)
 Medication Instructions:  Your physician recommends that you continue on your current medications as directed. Please refer to the Current Medication list given to you today.  *If you need a refill on your cardiac medications before your next appointment, please call your pharmacy*  Follow-Up: At [facility], you and your health needs are our priority.  As part of our continuing mission to provide you with exceptional heart care, our providers are all part of one team.  This team includes your primary Cardiologist (physician) and Advanced Practice Providers or APPs (Physician Assistants and Nurse Practitioners) who all work together to provide you with the care you need, when you need it.  Your next appointment:   12 month(s)  Provider:   Lauro Portal, MD    We recommend signing up for the patient portal called "MyChart".  Sign up information is provided on this After Visit Summary.  MyChart is used to connect with patients for Virtual Visits (Telemedicine).  Patients are able to view lab/test results, encounter notes, upcoming appointments, etc.  Non-urgent messages can be sent to your provider as well.   To learn more about what you can do with MyChart, go to [email or web address].   Other Instructions Heart-Healthy Eating Plan Eating a healthy diet is important for the health of your heart. A heart-healthy eating plan includes: Eating less unhealthy fats. Eating more healthy fats. Eating less salt in your food. Salt is also called sodium. Making other changes in your diet. Cooking Avoid frying your food. Try to bake, boil, grill, or broil it instead. You can also reduce fat by: Removing the skin from poultry. Removing all visible fats from meats. Steaming vegetables in water or broth. Meal planning  At meals, divide your plate into four equal parts: Fill one-half of your plate with vegetables and green salads. Fill one-fourth of your plate with whole grains. Fill  one-fourth of your plate with lean protein foods. Eat 2-4 cups of vegetables per day. One cup of vegetables is: 1 cup (91 g) broccoli or cauliflower florets. 2 medium carrots. 1 large bell pepper. 1 large sweet potato. 1 large tomato. 1 medium white potato. 2 cups (150 g) raw leafy greens. Eat 1-2 cups of fruit per day. One cup of fruit is: 1 small apple 1 large banana 1 cup (237 g) mixed fruit, 1 large orange,  cup (82 g) dried fruit, 1 cup (240 mL) 100% fruit juice. Eat more foods that have soluble fiber. These are apples, broccoli, carrots, beans, peas, and barley. Try to get 20-30 g of fiber per day. Eat 4-5 servings of nuts, legumes, and seeds per week: 1 serving of dried beans or legumes equals  cup (90 g) cooked. 1 serving of nuts is  oz (12 almonds, 24 pistachios, or 7 walnut halves). 1 serving of seeds equals  oz (8 g). General information Eat more home-cooked food. Eat less restaurant, buffet, and fast food. Limit or avoid alcohol. Limit foods that are high in starch and sugar. Avoid fried foods. Lose weight if you are overweight. Keep track of how much salt (sodium) you eat. This is important if you have high blood pressure. Ask your doctor to tell you more about this. Try to add vegetarian meals each week. Fats Choose healthy fats. These include olive oil and canola oil, flaxseeds, walnuts, almonds, and seeds. Eat more omega-3 fats. These include salmon, mackerel, sardines, tuna, flaxseed oil, and ground flaxseeds. Try to eat fish at least 2 times each week. Check food  labels. Avoid foods with trans fats or high amounts of saturated fat. Limit saturated fats. These are often found in animal products, such as meats, butter, and cream. These are also found in plant foods, such as palm oil, palm kernel oil, and coconut oil. Avoid foods with partially hydrogenated oils in them. These have trans fats. Examples are stick margarine, some tub margarines, cookies,  crackers, and other baked goods. What foods should I eat? Fruits All fresh, canned (in natural juice), or frozen fruits. Vegetables Fresh or frozen vegetables (raw, steamed, roasted, or grilled). Green salads. Grains Most grains. Choose whole wheat and whole grains most of the time. Rice and pasta, including brown rice and pastas made with whole wheat. Meats and other proteins Lean, well-trimmed beef, veal, pork, and lamb. Chicken and [place] without skin. All fish and shellfish. Wild duck, rabbit, pheasant, and venison. Egg whites or low-cholesterol egg substitutes. Dried beans, peas, lentils, and tofu. Seeds and most nuts. Dairy Low-fat or nonfat cheeses, including ricotta and mozzarella. Skim or 1% milk that is liquid, powdered, or evaporated. Buttermilk that is made with low-fat milk. Nonfat or low-fat yogurt. Fats and oils Non-hydrogenated (trans-free) margarines. Vegetable oils, including soybean, sesame, sunflower, olive, peanut, safflower, corn, canola, and cottonseed. Salad dressings or mayonnaise made with a vegetable oil. Beverages Mineral water. Coffee and tea. Diet carbonated beverages. Sweets and desserts Sherbet, gelatin, and fruit ice. Small amounts of dark chocolate. Limit all sweets and desserts. Seasonings and condiments All seasonings and condiments. The items listed above may not be a complete list of foods and drinks you can eat. Contact a dietitian for more options. What foods should I avoid? Fruits Canned fruit in heavy syrup. Fruit in cream or butter sauce. Fried fruit. Limit coconut. Vegetables Vegetables cooked in cheese, cream, or butter sauce. Fried vegetables. Grains Breads that are made with saturated or trans fats, oils, or whole milk. Croissants. Sweet rolls. Donuts. High-fat crackers, such as cheese crackers. Meats and other proteins Fatty meats, such as hot dogs, ribs, sausage, bacon, rib-eye roast or steak. High-fat deli meats, such as salami and  bologna. Caviar. Domestic duck and goose. Organ meats, such as liver. Dairy Cream, sour cream, cream cheese, and creamed cottage cheese. Whole-milk cheeses. Whole or 2% milk that is liquid, evaporated, or condensed. Whole buttermilk. Cream sauce or high-fat cheese sauce. Yogurt that is made from whole milk. Fats and oils Meat fat, or shortening. Cocoa butter, hydrogenated oils, palm oil, coconut oil, palm kernel oil. Solid fats and shortenings, including bacon fat, salt pork, lard, and butter. Nondairy cream substitutes. Salad dressings with cheese or sour cream. Beverages Regular sodas and juice drinks with added sugar. Sweets and desserts Frosting. Pudding. Cookies. Cakes. Pies. Milk chocolate or white chocolate. Buttered syrups. Full-fat ice cream or ice cream drinks. The items listed above may not be a complete list of foods and drinks to avoid. Contact a dietitian for more information. Summary Heart-healthy meal planning includes eating less unhealthy fats, eating more healthy fats, and making other changes in your diet. Eat a balanced diet. This includes fruits and vegetables, low-fat or nonfat dairy, lean protein, nuts and legumes, whole grains, and heart-healthy oils and fats. This information is not intended to replace advice given to you by your health care provider. Make sure you discuss any questions you have with your health care provider. Document Revised: 03/29/2021 Document Reviewed: 03/29/2021 Elsevier Patient Education  2024 [employer].

## 2023-07-03 NOTE — Assessment & Plan Note (Signed)
 History of essential hypertension with blood pressure measured today 138/72.  She is not on any hypertensive medications.

## 2023-07-03 NOTE — Assessment & Plan Note (Signed)
 BMI 32.  Referral to [facility] diet wellness center.

## 2023-07-03 NOTE — Assessment & Plan Note (Signed)
2D echo was normal

## 2023-07-03 NOTE — Assessment & Plan Note (Signed)
" >>  ASSESSMENT AND PLAN FOR ESSENTIAL HYPERTENSION WRITTEN ON 07/03/2023  4:18 PM BY Melvena Vink J, MD  History of essential hypertension with blood pressure measured today 138/72.  She is not on any hypertensive medications. "

## 2023-07-04 ENCOUNTER — Encounter (INDEPENDENT_AMBULATORY_CARE_PROVIDER_SITE_OTHER): Payer: Self-pay

## 2023-07-07 DIAGNOSIS — Z23 Encounter for immunization: Secondary | ICD-10-CM | POA: Diagnosis not present

## 2023-07-07 DIAGNOSIS — K7581 Nonalcoholic steatohepatitis (NASH): Secondary | ICD-10-CM | POA: Diagnosis not present

## 2023-07-07 DIAGNOSIS — D61818 Other pancytopenia: Secondary | ICD-10-CM | POA: Diagnosis not present

## 2023-07-07 DIAGNOSIS — K7460 Unspecified cirrhosis of liver: Secondary | ICD-10-CM | POA: Diagnosis not present

## 2023-07-17 ENCOUNTER — Other Ambulatory Visit: Payer: Self-pay | Admitting: Hematology & Oncology

## 2023-07-17 DIAGNOSIS — M3119 Other thrombotic microangiopathy: Secondary | ICD-10-CM

## 2023-07-19 ENCOUNTER — Other Ambulatory Visit: Payer: Self-pay | Admitting: Family Medicine

## 2023-07-19 DIAGNOSIS — Z1231 Encounter for screening mammogram for malignant neoplasm of breast: Secondary | ICD-10-CM

## 2023-07-27 ENCOUNTER — Ambulatory Visit (INDEPENDENT_AMBULATORY_CARE_PROVIDER_SITE_OTHER): Admitting: Family Medicine

## 2023-07-27 ENCOUNTER — Encounter: Payer: Self-pay | Admitting: Family Medicine

## 2023-07-27 VITALS — BP 116/74 | HR 73 | Temp 98.1°F | Resp 20 | Ht 62.0 in | Wt 178.4 lb

## 2023-07-27 DIAGNOSIS — E1159 Type 2 diabetes mellitus with other circulatory complications: Secondary | ICD-10-CM | POA: Diagnosis not present

## 2023-07-27 DIAGNOSIS — E118 Type 2 diabetes mellitus with unspecified complications: Secondary | ICD-10-CM | POA: Diagnosis not present

## 2023-07-27 DIAGNOSIS — M3119 Other thrombotic microangiopathy: Secondary | ICD-10-CM | POA: Diagnosis not present

## 2023-07-27 DIAGNOSIS — I152 Hypertension secondary to endocrine disorders: Secondary | ICD-10-CM

## 2023-07-27 DIAGNOSIS — E785 Hyperlipidemia, unspecified: Secondary | ICD-10-CM

## 2023-07-27 DIAGNOSIS — Z7985 Long-term (current) use of injectable non-insulin antidiabetic drugs: Secondary | ICD-10-CM

## 2023-07-27 DIAGNOSIS — E1169 Type 2 diabetes mellitus with other specified complication: Secondary | ICD-10-CM

## 2023-07-27 NOTE — Progress Notes (Unsigned)
 SUBJECTIVE:   Chief Complaint  Patient presents with   Bleeding/Bruising    On legs gums bleed in 1 spot    HPI Presents to clinic for acute visit  Discussed the use of AI scribe software for clinical note transcription with the patient, who gave verbal consent to proceed.  History of Present Illness Vicki [name] "Tyra Galley" is a 63 year old female who presents with bleeding gums and bruising.  She has been experiencing bleeding gums, initially noticed last week in one specific spot. The bleeding recurred this morning while brushing her teeth, affecting the same area. The bleeding starts after brushing but stops after rinsing her mouth. No recent dental work and no usual history of bleeding gums.  She also reports bruising, although specific details about the bruising were not discussed. She is awaiting blood work to check her platelet count.  Her diabetes is managed with Trulicity , and her last A1c check in April showed stable results. She monitors her blood glucose levels regularly, noting a reading of 129 mg/dL this morning. There have been no recent changes to her diabetes medication regimen.  She occasionally takes milk thistle for liver support and has recently started taking omega-3 supplements.  No bleeding from other areas and no recent dental work.     PERTINENT PMH / PSH: As above  OBJECTIVE:  BP 116/74   Pulse 73   Temp 98.1 F (36.7 C)   Resp 20   Ht 5\' 2"  (1.575 m)   Wt 178 lb 6 oz (80.9 kg)   LMP 04/30/2012 Comment: not sexually active  SpO2 98%   BMI 32.63 kg/m    Physical Exam Vitals reviewed.  Constitutional:      General: She is not in acute distress.    Appearance: Normal appearance. She is normal weight. She is not ill-appearing, toxic-appearing or diaphoretic.  HENT:     Mouth/Throat:     Mouth: Mucous membranes are moist.  Eyes:     General:        Right eye: No discharge.        Left eye: No discharge.     Conjunctiva/sclera:  Conjunctivae normal.  Cardiovascular:     Rate and Rhythm: Normal rate.  Pulmonary:     Effort: Pulmonary effort is normal.  Musculoskeletal:        General: Normal range of motion.  Skin:    General: Skin is warm.     Findings: Bruising present.  Neurological:     General: No focal deficit present.     Mental Status: She is alert and oriented to person, place, and time. Mental status is at baseline.  Psychiatric:        Mood and Affect: Mood normal.        Behavior: Behavior normal.        Thought Content: Thought content normal.        Judgment: Judgment normal.    {Perform Simple Foot Exam  Perform Detailed exam:1} {Insert foot Exam (Optional):30965}      07/27/2023    1:33 PM 05/29/2023   11:27 AM 04/20/2023   12:03 PM 12/06/2022    2:33 PM 10/24/2022    8:55 AM  Depression screen PHQ 2/9  Decreased Interest 0 0 0 0 0  Down, Depressed, Hopeless 0 0 0 1 0  PHQ - 2 Score 0 0 0 1 0  Altered sleeping 3 2 0 2 2  Tired, decreased energy 1 2 0 1 3  Change in appetite 1 1 0 0 0  Feeling bad or failure about yourself  0 0 0 0 0  Trouble concentrating 0 0 0 0 0  Moving slowly or fidgety/restless 0 0 0 0 0  Suicidal thoughts 0 0 0 0 0  PHQ-9 Score 5 5 0 4 5  Difficult doing work/chores Not difficult at all Somewhat difficult Not difficult at all Not difficult at all Somewhat difficult      07/27/2023    1:33 PM 05/29/2023   11:27 AM 04/20/2023   12:03 PM 12/06/2022    2:33 PM  GAD 7 : Generalized Anxiety Score  Nervous, Anxious, on Edge 1 0 0 1  Control/stop worrying 0 0 0 0  Worry too much - different things 0 0 0 1  Trouble relaxing 0 0 0 0  Restless 0 0 0 0  Easily annoyed or irritable 1 1 0 1  Afraid - awful might happen 1 0 0 1  Total GAD 7 Score 3 1 0 4  Anxiety Difficulty Not difficult at all Not difficult at all Not difficult at all Not difficult at all    ASSESSMENT/PLAN:  TTP (thrombotic thrombocytopenic purpura) (HCC) -     CBC with  Differential/Platelet    Assessment and Plan Assessment & Plan Bleeding gums and bruising Intermittent bleeding gums and bruising suggest possible thrombocytopenia. - Order CBC to evaluate platelet count. - Consider oncology referral if platelet count is low.  Type 2 diabetes mellitus Type 2 diabetes is well-controlled with stable blood glucose readings and target A1c. - Continue current diabetes management with Trulicity .       PDMP reviewed***  No follow-ups on file.  Valli Gaw, MD

## 2023-07-27 NOTE — Patient Instructions (Addendum)
 It was a pleasure meeting you today. Thank you for allowing me to take part in your health care.  Our goals for today as we discussed include:  Will check CBC today  Follow up with Oncology as scheduled    This is a list of the screening recommended for you and due dates:  Health Maintenance  Topic Date Due   DTaP/Tdap/Td vaccine (6 - Tdap) 08/03/2021   Eye exam for diabetics  04/08/2023   Pneumococcal Vaccination (1 of 2 - PCV) 05/28/2024*   Flu Shot  10/06/2023   Yearly kidney health urinalysis for diabetes  10/24/2023   Hemoglobin A1C  12/06/2023   Complete foot exam   01/31/2024   Yearly kidney function blood test for diabetes  05/29/2024   Mammogram  06/08/2024   Cologuard (Stool DNA test)  06/07/2026   Pap with HPV screening  07/29/2027   Hepatitis C Screening  Completed   HIV Screening  Completed   HPV Vaccine  Aged Out   Meningitis B Vaccine  Aged Out   Colon Cancer Screening  Discontinued   COVID-19 Vaccine  Discontinued   Zoster (Shingles) Vaccine  Discontinued  *Topic was postponed. The date shown is not the original due date.      If you have any questions or concerns, please do not hesitate to call the office at [phone].  I look forward to our next visit and until then take care and stay safe.  Regards,   Valli Gaw, MD   [facility]

## 2023-07-28 ENCOUNTER — Ambulatory Visit: Payer: Self-pay | Admitting: Family Medicine

## 2023-07-28 ENCOUNTER — Telehealth: Payer: Self-pay | Admitting: *Deleted

## 2023-07-28 LAB — CBC WITH DIFFERENTIAL/PLATELET
Basophils Absolute: 0 10*3/uL (ref 0.0–0.1)
Basophils Relative: 1.3 % (ref 0.0–3.0)
Eosinophils Absolute: 0.1 10*3/uL (ref 0.0–0.7)
Eosinophils Relative: 2.6 % (ref 0.0–5.0)
HCT: 40.3 % (ref 36.0–46.0)
Hemoglobin: 13.4 g/dL (ref 12.0–15.0)
Lymphocytes Relative: 37.1 % (ref 12.0–46.0)
Lymphs Abs: 1.2 10*3/uL (ref 0.7–4.0)
MCHC: 33.3 g/dL (ref 30.0–36.0)
MCV: 89.9 fl (ref 78.0–100.0)
Monocytes Absolute: 0.3 10*3/uL (ref 0.1–1.0)
Monocytes Relative: 9 % (ref 3.0–12.0)
Neutro Abs: 1.7 10*3/uL (ref 1.4–7.7)
Neutrophils Relative %: 50 % (ref 43.0–77.0)
Platelets: 136 10*3/uL — ABNORMAL LOW (ref 150.0–400.0)
RBC: 4.49 Mil/uL (ref 3.87–5.11)
RDW: 13.3 % (ref 11.5–15.5)
WBC: 3.3 10*3/uL — ABNORMAL LOW (ref 4.0–10.5)

## 2023-07-28 NOTE — Telephone Encounter (Signed)
 Message received from patient stating that her platelet count was 136 with her PCP yesterday and wants to make sure that result is ok with Dr. Maria Shiner. Dr. Maria Shiner notified. Call placed back to patient and patient notified that Dr Maria Shiner is ok with platelet count of 136 and that he will recheck her platelet count at her next appt in August.  Pt is appreciative of call back and has no further questions or concerns at this time.

## 2023-08-02 ENCOUNTER — Encounter: Payer: Self-pay | Admitting: Family Medicine

## 2023-08-02 NOTE — Assessment & Plan Note (Signed)
 Declined statin Continues to want to work on lifestyle

## 2023-08-02 NOTE — Assessment & Plan Note (Signed)
 One episode bleeding gums lasting seconds that self resolved.  Small areas of bruising on lower legs without trauma or injury. - Order CBC to evaluate platelet count. - Consider oncology referral if platelet count is low.

## 2023-08-02 NOTE — Assessment & Plan Note (Signed)
 Well controlled without antihypertensives - Monitor blood pressure. - Consider antihypertensive medication if blood pressure increases.

## 2023-08-02 NOTE — Assessment & Plan Note (Signed)
 Controlled - Continue Trulicity  0.75 mg weekly as tolerated. - Emphasize diet and exercise. - Provide list of low glycemic index foods.

## 2023-08-21 ENCOUNTER — Encounter: Payer: Self-pay | Admitting: Family Medicine

## 2023-08-21 ENCOUNTER — Ambulatory Visit: Admitting: Family Medicine

## 2023-08-21 VITALS — BP 136/84 | HR 71 | Temp 98.2°F | Resp 20 | Ht 62.0 in | Wt 182.0 lb

## 2023-08-21 DIAGNOSIS — E118 Type 2 diabetes mellitus with unspecified complications: Secondary | ICD-10-CM

## 2023-08-21 DIAGNOSIS — E559 Vitamin D deficiency, unspecified: Secondary | ICD-10-CM

## 2023-08-21 DIAGNOSIS — G629 Polyneuropathy, unspecified: Secondary | ICD-10-CM

## 2023-08-21 DIAGNOSIS — I152 Hypertension secondary to endocrine disorders: Secondary | ICD-10-CM

## 2023-08-21 DIAGNOSIS — E1169 Type 2 diabetes mellitus with other specified complication: Secondary | ICD-10-CM

## 2023-08-21 DIAGNOSIS — M3119 Other thrombotic microangiopathy: Secondary | ICD-10-CM

## 2023-08-21 DIAGNOSIS — E1159 Type 2 diabetes mellitus with other circulatory complications: Secondary | ICD-10-CM

## 2023-08-21 DIAGNOSIS — E538 Deficiency of other specified B group vitamins: Secondary | ICD-10-CM

## 2023-08-21 DIAGNOSIS — Z1231 Encounter for screening mammogram for malignant neoplasm of breast: Secondary | ICD-10-CM

## 2023-08-21 DIAGNOSIS — R82998 Other abnormal findings in urine: Secondary | ICD-10-CM

## 2023-08-21 DIAGNOSIS — G6289 Other specified polyneuropathies: Secondary | ICD-10-CM

## 2023-08-21 DIAGNOSIS — E785 Hyperlipidemia, unspecified: Secondary | ICD-10-CM

## 2023-08-21 NOTE — Progress Notes (Unsigned)
 SUBJECTIVE:   Chief Complaint  Patient presents with   Diabetes    3 month follow up   HPI Presents for follow up chronic disease management  Discussed the use of AI scribe software for clinical note transcription with the patient, who gave verbal consent to proceed.  History of Present Illness Vicki [name] is a 63 year old female with diabetes who presents for follow-up of her A1c levels.  Her A1c levels were last checked a month ago and recorded at 6.9. Her blood sugar was 173 this morning.  She manages her diabetes with Trulicity , which she is not taking consistently due to a dislike of injections. She has been experiencing symptoms of neuropathy, such as tingling in her toes. She has previously discussed medication adjustments, including the possibility of switching to Ozempic , but insurance issues have been a barrier.  She is concerned about her vitamin levels, specifically vitamin D , B12, B1, and B6, as she takes a multivitamin and wants to ensure she is not deficient. She has been experiencing fatigue and occasional constipation, but no significant weight loss or changes in bowel habits.  She has a history of lower platelet counts and is followed by oncology for this issue. She has been experiencing acid reflux and took Gaviscon recently. She is also managing her cholesterol levels, which have improved since her last cardiology visit, and she is not currently on statins.  She does not consume alcohol but drinks a lot of tea.    PERTINENT PMH / PSH: As above  OBJECTIVE:  BP 136/84   Pulse 71   Temp 98.2 F (36.8 C)   Resp 20   Ht 5' 2 (1.575 m)   Wt 182 lb (82.6 kg)   LMP 04/30/2012 Comment: not sexually active  SpO2 98%   BMI 33.29 kg/m    Physical Exam Vitals reviewed.  Constitutional:      General: She is not in acute distress.    Appearance: Normal appearance. She is obese. She is not ill-appearing, toxic-appearing or diaphoretic.    Eyes:     General:        Right eye: No discharge.        Left eye: No discharge.     Conjunctiva/sclera: Conjunctivae normal.    Cardiovascular:     Rate and Rhythm: Normal rate and regular rhythm.     Heart sounds: Normal heart sounds.  Pulmonary:     Effort: Pulmonary effort is normal.     Breath sounds: Normal breath sounds.  Abdominal:     General: Bowel sounds are normal.   Musculoskeletal:        General: Normal range of motion.   Skin:    General: Skin is warm and dry.   Neurological:     General: No focal deficit present.     Mental Status: She is alert and oriented to person, place, and time. Mental status is at baseline.   Psychiatric:        Mood and Affect: Mood normal.        Behavior: Behavior normal.        Thought Content: Thought content normal.        Judgment: Judgment normal.           08/21/2023   10:04 AM 07/27/2023    1:33 PM 05/29/2023   11:27 AM 04/20/2023   12:03 PM 12/06/2022    2:33 PM  Depression screen PHQ 2/9  Decreased Interest 0 0  0 0 0  Down, Depressed, Hopeless 0 0 0 0 1  PHQ - 2 Score 0 0 0 0 1  Altered sleeping 3 3 2  0 2  Tired, decreased energy 1 1 2  0 1  Change in appetite 0 1 1 0 0  Feeling bad or failure about yourself  0 0 0 0 0  Trouble concentrating 0 0 0 0 0  Moving slowly or fidgety/restless 0 0 0 0 0  Suicidal thoughts 0 0 0 0 0  PHQ-9 Score 4 5 5  0 4  Difficult doing work/chores Not difficult at all Not difficult at all Somewhat difficult Not difficult at all Not difficult at all      08/21/2023   10:04 AM 07/27/2023    1:33 PM 05/29/2023   11:27 AM 04/20/2023   12:03 PM  GAD 7 : Generalized Anxiety Score  Nervous, Anxious, on Edge 1 1 0 0  Control/stop worrying 0 0 0 0  Worry too much - different things 1 0 0 0  Trouble relaxing 0 0 0 0  Restless 0 0 0 0  Easily annoyed or irritable 1 1 1  0  Afraid - awful might happen 0 1 0 0  Total GAD 7 Score 3 3 1  0  Anxiety Difficulty Somewhat difficult Not  difficult at all Not difficult at all Not difficult at all    ASSESSMENT/PLAN:  B12 deficiency -     Vitamin B12  Vitamin D  deficiency Assessment & Plan: Check Vitamin D  level  Orders: -     VITAMIN D  25 Hydroxy (Vit-D Deficiency, Fractures)  Hypomagnesuria  Type 2 diabetes mellitus with complications (HCC) Assessment & Plan: A1c at 6.9% indicates suboptimal control. Prefers oral medication over injections. Discussed switching to Rybelsus for better compliance and glycemic control. - Refer to pharmacy for diabetes management and potential switch to Rybelsus. - Follow up with pharmacy  in three months. - Follow up with new PCP in six months. - Declined statin, ARB - BP borderline.  Goal <140/90.  Currently asymptomatic - Foot exam up to date - Recommend annual eye exam  Orders: -     AMB Referral VBCI Care Management  Neuropathy -     Vitamin B6 -     Vitamin B1 -     Iron, TIBC and Ferritin Panel -     Vitamin B1; Future -     Vitamin B6; Future  Breast cancer screening by mammogram -     3D Screening Mammogram, Left and Right  TTP (thrombotic thrombocytopenic purpura) (HCC) Assessment & Plan: Continue to follow with Oncology     Hypertension associated with diabetes (HCC) Assessment & Plan: Well controlled without antihypertensives - Monitor blood pressure. - Consider antihypertensive medication if blood pressure increases.   Hyperlipidemia associated with type 2 diabetes mellitus (HCC) Assessment & Plan: Not on statins. Discussed benefits and importance of cholesterol management in diabetes. - Review cholesterol levels and consider starting statin therapy.   Other polyneuropathy Assessment & Plan: Neuropathy likely due to suboptimal glycemic control. Discussed gabapentin  or neurologist referral. - Consider referral to a neurologist for neuropathy management. - Consider trial of gabapentin  if neuropathy symptoms persist. - Patient prefers to hold off  on medications and referrals at this time - She is requesting to have Vitamin B1, B6, B12, iron panel and Magnesium  levels checked today   Vitamin B12 deficiency Assessment & Plan: Check Vitamin B 12 level   Hypomagnesemia Assessment & Plan: Patient requesting magnesium   levels as she has been taking supplements. Recent Magnesium  level 1.9 <1 month ago. No indication to recheck today     PDMP reviewed  Return in about 6 months (around 02/20/2024), or if symptoms worsen or fail to improve, for PCP.  Valli Gaw, MD

## 2023-08-21 NOTE — Patient Instructions (Addendum)
 It was a pleasure meeting you today. Thank you for allowing me to take part in your health care.  Our goals for today as we discussed include:  We will get some labs today.  If they are abnormal or we need to do something about them, I will call you.  If they are normal, I will send you a message on MyChart (if it is active) or a letter in the mail.  If you don't hear from us  in 2 weeks, please call the office at the number below.   Recent A1c 6.9 Recent Magnesium  1.9  Last bad cholesterol 117.  Goal <70.  Recommend cholesterol medication.  Crestor  Referral sent to Pharmacy to follow up with management of diabetes until you see NP Arnette in December   Eye exam is due.  Please schedule appointment and have results sent to clinic  Referral sent for Mammogram. Please call to schedule appointment. [facility] [address] [place], [place] [address] [phone]     This is a list of the screening recommended for you and due dates:  Health Maintenance  Topic Date Due   DTaP/Tdap/Td vaccine (6 - Tdap) 08/03/2021   Eye exam for diabetics  04/08/2023   Mammogram  06/09/2023   Pneumococcal Vaccination (1 of 2 - PCV) 05/28/2024*   Flu Shot  10/06/2023   Yearly kidney health urinalysis for diabetes  10/24/2023   Hemoglobin A1C  12/06/2023   Complete foot exam   01/31/2024   Yearly kidney function blood test for diabetes  05/29/2024   Cologuard (Stool DNA test)  06/07/2026   Pap with HPV screening  07/29/2027   Hepatitis C Screening  Completed   HIV Screening  Completed   HPV Vaccine  Aged Out   Meningitis B Vaccine  Aged Out   Colon Cancer Screening  Discontinued   COVID-19 Vaccine  Discontinued   Zoster (Shingles) Vaccine  Discontinued  *Topic was postponed. The date shown is not the original due date.     If you have any questions or concerns, please do not hesitate to call the office at [phone].  I look forward to our next visit and until then take  care and stay safe.  Regards,   Valli Gaw, MD   [facility]

## 2023-08-22 ENCOUNTER — Encounter: Payer: Self-pay | Admitting: Family Medicine

## 2023-08-22 LAB — IRON,TIBC AND FERRITIN PANEL
Ferritin: 140 ng/mL (ref 15–150)
Iron Saturation: 57 % — ABNORMAL HIGH (ref 15–55)
Iron: 152 ug/dL — ABNORMAL HIGH (ref 27–139)
Total Iron Binding Capacity: 268 ug/dL (ref 250–450)
UIBC: 116 ug/dL — ABNORMAL LOW (ref 118–369)

## 2023-08-22 LAB — VITAMIN D 25 HYDROXY (VIT D DEFICIENCY, FRACTURES): Vit D, 25-Hydroxy: 31.7 ng/mL (ref 30.0–100.0)

## 2023-08-22 LAB — VITAMIN B6

## 2023-08-22 LAB — VITAMIN B1

## 2023-08-22 LAB — VITAMIN B12: Vitamin B-12: 1722 pg/mL — ABNORMAL HIGH (ref 232–1245)

## 2023-08-23 ENCOUNTER — Other Ambulatory Visit

## 2023-08-23 DIAGNOSIS — G629 Polyneuropathy, unspecified: Secondary | ICD-10-CM | POA: Diagnosis not present

## 2023-08-24 ENCOUNTER — Encounter: Payer: Self-pay | Admitting: Family Medicine

## 2023-08-24 ENCOUNTER — Ambulatory Visit: Payer: Self-pay | Admitting: Family Medicine

## 2023-08-24 NOTE — Assessment & Plan Note (Signed)
 Check Vitamin B 12 level

## 2023-08-24 NOTE — Assessment & Plan Note (Signed)
 Check Vitamin D level

## 2023-08-24 NOTE — Assessment & Plan Note (Signed)
 Well controlled without antihypertensives - Monitor blood pressure. - Consider antihypertensive medication if blood pressure increases.

## 2023-08-24 NOTE — Assessment & Plan Note (Signed)
 Not on statins. Discussed benefits and importance of cholesterol management in diabetes. - Review cholesterol levels and consider starting statin therapy.

## 2023-08-24 NOTE — Assessment & Plan Note (Signed)
 A1c at 6.9% indicates suboptimal control. Prefers oral medication over injections. Discussed switching to Rybelsus for better compliance and glycemic control. - Refer to pharmacy for diabetes management and potential switch to Rybelsus. - Follow up with pharmacy  in three months. - Follow up with new PCP in six months. - Declined statin, ARB - BP borderline.  Goal <140/90.  Currently asymptomatic - Foot exam up to date - Recommend annual eye exam

## 2023-08-24 NOTE — Assessment & Plan Note (Addendum)
 Neuropathy likely due to suboptimal glycemic control. Discussed gabapentin  or neurologist referral. - Consider referral to a neurologist for neuropathy management. - Consider trial of gabapentin  if neuropathy symptoms persist. - Patient prefers to hold off on medications and referrals at this time - She is requesting to have Vitamin B1, B6, B12, iron panel and Magnesium  levels checked today

## 2023-08-24 NOTE — Assessment & Plan Note (Signed)
 Continue to follow with Oncology

## 2023-08-24 NOTE — Assessment & Plan Note (Signed)
 Patient requesting magnesium  levels as she has been taking supplements. Recent Magnesium  level 1.9 <1 month ago. No indication to recheck today

## 2023-08-28 LAB — VITAMIN B1: Thiamine: 93.8 nmol/L (ref 66.5–200.0)

## 2023-08-28 LAB — VITAMIN B6: Vitamin B6: 11.2 ug/L (ref 3.4–65.2)

## 2023-08-31 ENCOUNTER — Ambulatory Visit: Admitting: Radiology

## 2023-09-05 ENCOUNTER — Telehealth: Payer: Self-pay

## 2023-09-05 DIAGNOSIS — K7460 Unspecified cirrhosis of liver: Secondary | ICD-10-CM | POA: Diagnosis not present

## 2023-09-05 DIAGNOSIS — K7581 Nonalcoholic steatohepatitis (NASH): Secondary | ICD-10-CM | POA: Diagnosis not present

## 2023-09-05 DIAGNOSIS — K838 Other specified diseases of biliary tract: Secondary | ICD-10-CM | POA: Diagnosis not present

## 2023-09-05 DIAGNOSIS — D61818 Other pancytopenia: Secondary | ICD-10-CM | POA: Diagnosis not present

## 2023-09-05 NOTE — Progress Notes (Signed)
 Care Guide Pharmacy Note  09/05/2023 Name: Vicki [name] MRN: [id number] DOB: 1960/03/17  Referred By: Hope Merle, MD Reason for referral: Complex Care Management (Outreach to schedule with Pharm d )   Vicki [name] is a 63 y.o. year old female who is a primary care patient of Hope Merle, MD.  Vicki [name] was referred to the pharmacist for assistance related to: DMII  An unsuccessful telephone outreach was attempted today to contact the patient who was referred to the pharmacy team for assistance with medication management. Additional attempts will be made to contact the patient.  Jeoffrey Buffalo , RMA     [facility]  [facility], [facility] Guide  Direct Dial: [phone]  Website: [email or web address]

## 2023-09-06 NOTE — Progress Notes (Signed)
 63 y.o. G4P1001 female with osteopenia, HS here for annual exam. Single.  Patient's last menstrual period was 04/30/2012.   She reports urinary urge incontience; wear pantiliner daily. Nocturia- 3x. No SUI. Not doing kegels.  Some vaginal burning - possibly from using benzoyl peroxide  in vaginal area for HS. Previously managed by dermatology but retired. Uses antibiotics as needed for flares.  Used PV estrace  for GSM a few years ago but stopped (no reason).  Abnormal bleeding: none Pelvic discharge or pain: none Breast mass, nipple discharge or skin changes : none  Sexually active: No Birth control: Postmenopause Last PAP:     Component Value Date/Time   DIAGPAP  07/29/2022 1508    - Negative for intraepithelial lesion or malignancy (NILM)   HPVHIGH Negative 07/29/2022 1508   ADEQPAP  07/29/2022 1508    Satisfactory for evaluation; transformation zone component PRESENT.   Last mammogram: 06/09/22 Bi-Rads 1, density c Last colonoscopy: 06/15/12 10 yr recall; colorguard 2025 - normal per pt DXA: 2021, osteopenia  Exercising: Not currently Smoker: No  Flowsheet Row Office Visit from 09[facility]13/2023 in [facility]  PHQ-2 Total Score 0    Flowsheet Row Office Visit from 08/21/2023 in [facility]  PHQ-9 Total Score 4    GYN HISTORY: No sig hx  OB History  Gravida Para Term Preterm AB Living  1 1 1  0 0 1  SAB IAB Ectopic Multiple Live Births  0 0 0 0 1    # Outcome Date GA Lbr Len/2nd Weight Sex Type Anes PTL Lv  1 Term      Vag-Spont   LIV   Past Medical History:  Diagnosis Date   Allergy    Arthritis    Boil    Cancer (HCC)    throbotic thrombocytopenic purpura   Classical migraine with intractable migraine 04/09/2018   Diabetes (HCC)    type 2   Elevated liver enzymes    Fatty liver    Hidradenitis    Hyperlipemia    Hypertension    Palpitations    frequent PVCs on event monitor   PVC's  (premature ventricular contractions)    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   Past Surgical History:  Procedure Laterality Date   BREAST CYST ASPIRATION Left    COLONOSCOPY  2014   ECTOPIC PREGNANCY SURGERY  1990   IR FLUORO GUIDE CV LINE RIGHT  03/05/2019   IR FLUORO GUIDE CV LINE RIGHT  03/15/2019   IR FLUORO GUIDE CV LINE RIGHT  05/17/2019   IR FLUORO GUIDE CV LINE RIGHT  05/23/2019   IR REMOVAL TUN CV CATH W/O FL  04/12/2019   IR REMOVAL TUN CV CATH W/O FL  08/06/2019   IR US  GUIDE VASC ACCESS RIGHT  03/05/2019   IR US  GUIDE VASC ACCESS RIGHT  03/15/2019   IR US  GUIDE VASC ACCESS RIGHT  05/17/2019   TONSILLECTOMY  1980   UPPER GI ENDOSCOPY  08/2017   [facility]   Current Outpatient Medications on File Prior to Visit  Medication Sig Dispense Refill   Alum Hydroxide-Mag Carbonate (GAVISCON EXTRA STRENGTH PO) Take 1 tablet by mouth 4 (four) times daily as needed.     B-COMPLEX-C PO Take 1 capsule by mouth every other day.     benzoyl peroxide  5 % external liquid Apply topically 2 (two) times daily. 142 g 12   cholecalciferol (VITAMIN D3) 25 MCG (1000 UT) tablet Take  1,000 Units by mouth daily.     Coenzyme Q10 (COQ10 PO) Take by mouth.     Dulaglutide  (TRULICITY ) 0.75 MG/0.5ML SOAJ Inject 0.75 mg into the skin once a week. 12 mL 1   folic acid  (FOLVITE ) 1 MG tablet Take 2 tablets by mouth once daily 60 tablet 0   Lancets (ONETOUCH ULTRASOFT) lancets 1 each by Other route daily at 12 noon. Use to check Glucose once daily as directed. 100 each 1   Methylcobalamin 5000 MCG SUBL Place 500 mcg under the tongue daily.     Milk Thistle 1000 MG CAPS Take 1 capsule by mouth daily.     mometasone  (ELOCON ) 0.1 % cream APPLY  CREAM TOPICALLY ONCE DAILY 45 g 0   Multiple Vitamin (MULTIVITAMIN PO) Take 1 tablet by mouth every other day.     Omega-3 Fatty Acids (OMEGA 3 FISH OIL PO) Take by mouth.     ONETOUCH ULTRA test strip USE  STRIP TO CHECK GLUCOSE ONCE TO TWICE  DAILY AS DIRECTED 100 each 0   No current facility-administered medications on file prior to visit.   Social History   Socioeconomic History   Marital status: Single    Spouse name: Not on file   Number of children: 1   Years of education: Not on file   Highest education level: Some college, no degree  Occupational History    Employer: [employer]  Tobacco Use   Smoking status: Never   Smokeless tobacco: Never  Vaping Use   Vaping status: Never Used  Substance and Sexual Activity   Alcohol use: Yes    Comment: 2 a month   Drug use: No   Sexual activity: Not Currently    Partners: Male    Birth control/protection: Post-menopausal  Other Topics Concern   Not on file  Social History Narrative   02/26/19   From: the area   Living: lives with roommate - they get along   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      Family: Daughter - Higher education careers adviser - good relationship currently age 17       Enjoys: watch TV and read      Exercise: dancing, on her own   Diet: does not follow diabetic diet, not eating as much      Safety   Seat belts: Yes    Guns: No   Safe in relationships: Yes        Social Drivers of Corporate investment banker Strain: Low Risk  (05/22/2023)   Overall Financial Resource Strain (CARDIA)    Difficulty of Paying Living Expenses: Not very hard  Food Insecurity: No Food Insecurity (05/22/2023)   Hunger Vital Sign    Worried About Running Out of Food in the Last Year: Never true    Ran Out of Food in the Last Year: Never true  Transportation Needs: No Transportation Needs (05/22/2023)   PRAPARE - Administrator, Civil Service (Medical): No    Lack of Transportation (Non-Medical): No  Physical Activity: Insufficiently Active (05/22/2023)   Exercise Vital Sign    Days of Exercise per Week: 1 day    Minutes of Exercise per Session: 30 min  Stress: No Stress Concern Present (05/22/2023)   [employer] of Occupational Health - Occupational Stress  Questionnaire    Feeling of Stress : Only a little  Social Connections: Moderately Isolated (05/22/2023)   Social Connection and Isolation Panel    Frequency of  Communication with Friends and Family: More than three times a week    Frequency of Social Gatherings with Friends and Family: More than three times a week    Attends Religious Services: More than 4 times per year    Active Member of [employer] or Organizations: No    Attends Engineer, structural: Not on file    Marital Status: Never married  Catering manager Violence: Not on file   Family History  Problem Relation Age of Onset   Hypertension Mother    Diabetes Mother    Heart attack Mother 93   Asthma Mother        Childhood   Arthritis Mother    Heart disease Mother    Hyperlipidemia Mother    Hypertension Sister    Diabetes Sister    Asthma Sister 40       asthma attack cause of death   Diabetes Brother    Hypertension Brother    Diabetes Brother    Hypertension Brother    Hypertension Maternal Grandmother    Heart attack Maternal Grandfather 14   Colon cancer Neg Hx    Thyroid  cancer Neg Hx    Allergies  Allergen Reactions   Metformin And Related Itching    Heart palpitation   Sulfa Antibiotics Itching and Other (See Comments)     PE Today's Vitals   09/13/23 1348 09/13/23 1422  BP: (!) 156/80 (!) 172/90  Pulse: 78   Temp: 98.2 F (36.8 C)   TempSrc: Oral   SpO2: 98%   Weight: 181 lb (82.1 kg)   Height: 5' 1.5 (1.562 m)    Body mass index is 33.65 kg/m.  Physical Exam Vitals reviewed. Exam conducted with a chaperone present.  Constitutional:      General: She is not in acute distress.    Appearance: Normal appearance.  HENT:     Head: Normocephalic and atraumatic.     Nose: Nose normal.  Eyes:     Extraocular Movements: Extraocular movements intact.     Conjunctiva/sclera: Conjunctivae normal.  Neck:     Thyroid : No thyroid  mass, thyromegaly or thyroid  tenderness.  Pulmonary:      Effort: Pulmonary effort is normal.  Chest:     Chest wall: No mass or tenderness.  Breasts:    Right: Normal. No swelling, mass, nipple discharge, skin change or tenderness.     Left: Normal. No swelling, mass, nipple discharge, skin change or tenderness.  Abdominal:     General: There is no distension.     Palpations: Abdomen is soft.     Tenderness: There is no abdominal tenderness.  Genitourinary:    General: Normal vulva.     Exam position: Lithotomy position.     Urethra: No prolapse.     Vagina: Normal. No vaginal discharge or bleeding.     Cervix: Normal. No lesion.     Uterus: Normal. Not enlarged and not tender.      Adnexa: Right adnexa normal and left adnexa normal.     Comments: Kegel 4/5 Musculoskeletal:        General: Normal range of motion.     Cervical back: Normal range of motion.  Lymphadenopathy:     Upper Body:     Right upper body: No axillary adenopathy.     Left upper body: No axillary adenopathy.     Lower Body: No right inguinal adenopathy. No left inguinal adenopathy.  Skin:    General: Skin is warm and  dry.  Neurological:     General: No focal deficit present.     Mental Status: She is alert.  Psychiatric:        Mood and Affect: Mood normal.        Behavior: Behavior normal.      Assessment and Plan:        Well woman exam with routine gynecological exam Assessment & Plan: Cervical cancer screening performed according to ASCCP guidelines. Encouraged annual mammogram screening, DUE Cologuard UTD DXA due Labs and immunizations with her primary Encouraged safe sexual practices as indicated Encouraged healthy lifestyle practices with diet and exercise For patients under 50-70yo, I recommend 1200mg  calcium  daily and 600IU of vitamin D  daily.  Urinary incontinence, unspecified type -     Urinalysis,Complete w/RFL Culture Patient with OAB symptoms. Education on cause provided. Discussed lifestyle modifications for management, including  avoidance of fluid prior to bed and bladder irritants. Also discussed PFPT and medical managements (including use of myrbetriq, gemtesa (okay with HTN), trospium, oxybutynin ). Patient desires kegel exercises.   Osteopenia of multiple sites -     DG Bone Density; Future  Negative depression screening   Vera LULLA Pa, MD

## 2023-09-12 NOTE — Progress Notes (Signed)
 Care Guide Pharmacy Note  09/12/2023 Name: Thandiwe Siragusa MRN: [id number] DOB: 17-May-1960  Referred By: Hope Merle, MD Reason for referral: Complex Care Management (Outreach to schedule with Pharm d )   Arnita Shirline Kendle is a 63 y.o. year old female who is a primary care patient of Hope Merle, MD.  Margret Rosezella Kronick was referred to the pharmacist for assistance related to: DMII  A second unsuccessful telephone outreach was attempted today to contact the patient who was referred to the pharmacy team for assistance with medication assistance. Additional attempts will be made to contact the patient.  Jeoffrey Buffalo , RMA     [facility]  [facility], [facility] Guide  Direct Dial: [phone]  Website: [email or web address]

## 2023-09-13 ENCOUNTER — Encounter: Payer: Self-pay | Admitting: Obstetrics and Gynecology

## 2023-09-13 ENCOUNTER — Other Ambulatory Visit: Payer: Self-pay | Admitting: Obstetrics and Gynecology

## 2023-09-13 ENCOUNTER — Other Ambulatory Visit: Payer: Self-pay | Admitting: Hematology & Oncology

## 2023-09-13 ENCOUNTER — Ambulatory Visit (INDEPENDENT_AMBULATORY_CARE_PROVIDER_SITE_OTHER): Admitting: Obstetrics and Gynecology

## 2023-09-13 VITALS — BP 172/90 | HR 78 | Temp 98.2°F | Ht 61.5 in | Wt 181.0 lb

## 2023-09-13 DIAGNOSIS — Z01419 Encounter for gynecological examination (general) (routine) without abnormal findings: Secondary | ICD-10-CM

## 2023-09-13 DIAGNOSIS — M8589 Other specified disorders of bone density and structure, multiple sites: Secondary | ICD-10-CM | POA: Insufficient documentation

## 2023-09-13 DIAGNOSIS — R32 Unspecified urinary incontinence: Secondary | ICD-10-CM | POA: Insufficient documentation

## 2023-09-13 DIAGNOSIS — M3119 Other thrombotic microangiopathy: Secondary | ICD-10-CM

## 2023-09-13 DIAGNOSIS — Z1331 Encounter for screening for depression: Secondary | ICD-10-CM

## 2023-09-13 DIAGNOSIS — Z1231 Encounter for screening mammogram for malignant neoplasm of breast: Secondary | ICD-10-CM

## 2023-09-13 HISTORY — DX: Encounter for gynecological examination (general) (routine) without abnormal findings: Z01.419

## 2023-09-13 LAB — URINALYSIS, COMPLETE W/RFL CULTURE
Bacteria, UA: NONE SEEN /HPF
Bilirubin Urine: NEGATIVE
Glucose, UA: NEGATIVE
Hgb urine dipstick: NEGATIVE
Hyaline Cast: NONE SEEN /LPF
Ketones, ur: NEGATIVE
Leukocyte Esterase: NEGATIVE
Nitrites, Initial: NEGATIVE
Protein, ur: NEGATIVE
RBC / HPF: NONE SEEN /HPF (ref 0–2)
Specific Gravity, Urine: 1.005 (ref 1.001–1.035)
WBC, UA: NONE SEEN /HPF (ref 0–5)
pH: 6.5 (ref 5.0–8.0)

## 2023-09-13 LAB — NO CULTURE INDICATED

## 2023-09-13 NOTE — Progress Notes (Signed)
 Care Guide Pharmacy Note  09/13/2023 Name: Vicki [name] MRN: [id number] DOB: Dec 29, 1960  Referred By: Hope Merle, MD Reason for referral: Complex Care Management (Outreach to schedule with Pharm d )   Vicki [name] is a 63 y.o. year old female who is a primary care patient of Hope Merle, MD.  Vicki [name] was referred to the pharmacist for assistance related to: DMII  A third unsuccessful telephone outreach was attempted today to contact the patient who was referred to the pharmacy team for assistance with medication management. The Population Health team is pleased to engage with this patient at any time in the future upon receipt of referral and should he/she be interested in assistance from the [employer] [facility] Health team.  Jeoffrey Buffalo , RMA     [facility]  [facility], [facility] Guide  Direct Dial: [phone]  Website: [email or web address]

## 2023-09-13 NOTE — Assessment & Plan Note (Signed)
 Cervical cancer screening performed according to ASCCP guidelines. Encouraged annual mammogram screening Cologuard UTD DXA due Labs and immunizations with her primary Encouraged safe sexual practices as indicated Encouraged healthy lifestyle practices with diet and exercise For patients under 50-64yo, I recommend 1200mg  calcium  daily and 600IU of vitamin D  daily.

## 2023-09-13 NOTE — Patient Instructions (Signed)

## 2023-09-14 ENCOUNTER — Encounter: Payer: Self-pay | Admitting: Hematology & Oncology

## 2023-09-26 ENCOUNTER — Inpatient Hospital Stay
Admission: RE | Admit: 2023-09-26 | Discharge: 2023-09-26 | Discharge status: Home / Self Care | Source: Ambulatory Visit | Attending: Obstetrics and Gynecology

## 2023-09-26 ENCOUNTER — Other Ambulatory Visit (INDEPENDENT_AMBULATORY_CARE_PROVIDER_SITE_OTHER): Admitting: Pharmacist

## 2023-09-26 DIAGNOSIS — Z1231 Encounter for screening mammogram for malignant neoplasm of breast: Secondary | ICD-10-CM

## 2023-09-26 DIAGNOSIS — E118 Type 2 diabetes mellitus with unspecified complications: Secondary | ICD-10-CM

## 2023-09-26 NOTE — Patient Instructions (Addendum)
 [name],   It was a pleasure to speak with you today! As we discussed:?   Continue Trulicity  0.75 mg once each week.  GLP1 medications such as Trulicity  are a great option for both diabetes as well as reducing risks related to other disease. They can prevent/slow development of kidney disease related to diabetes. They have also show benefit for your form of liver disease.   While on a GLP1 medication like Trulicity : Stomach side effects typically resolve after several weeks of medicine use. If doses are missed, we typically expect to see side effects continue.  Eating smaller meals, avoiding high-fat and high-sugar foods, and remaining upright after eating may reduce nausea.  Note, that nausea typically resolves over time and is often a temporary side effect as your body adjusts.    Please reach out prior to your next scheduled appointment should you have any questions or concerns.  You may respond directly to this message, or leave me a voicemail at [phone] and I will get back to you shortly.   Thank you!   Future Appointments  Date Time Provider Department Center  09/26/2023  3:20 PM [facility] 3 [facility]  10/25/2023  3:00 PM [facility] None  10/25/2023  3:15 PM Ennever, Maude SAUNDERS, MD [facility] None  10/31/2023 11:00 AM [facility] [place] PHARMACIST [facility]  04/29/2024  2:00 PM Bair, Luke, MD [facility]    Manuelita FABIENE Kobs, PharmD Clinical Pharmacist [facility] [phone]

## 2023-09-26 NOTE — Progress Notes (Signed)
 09/26/2023 Name: Vicki [name] MRN: [id number] DOB: November 29, 1960  Subjective  Chief Complaint  Patient presents with   Diabetes    Reason for visit: ?  Vicki [name] is a 63 y.o. female with a history of diabetes (type 2), who presents today for an initial diabetes pharmacotherapy visit.? Pertinent PMH also includes TTP, HTN, NASH, HLD, osteopenia, OAB.  Known DM Complications: peripheral neuropathy   Care Team: Primary Care Provider: No primary care provider on file.  PCP: [facility] ? TOC  ? Bair (Feb) Doctor of the day: Dr. Allena Hamilton   Date of Last Diabetes Related Visit: with PCP on 08/21/23  Recent Summary of Change: Not taking Trulicity  regularly. Declines Ryblesus at this time. PharmD referral to discuss options/adherence  Medication Access/Adherence: Prescription drug coverage: Payor: BLUE CROSS BLUE SHIELD / Plan: BCBS/[employer] PPO / Product Type: *No Product type* / .  - Reports that all medications are affordable through her insurance.  - Current Patient Assistance: None International aid/development worker program)  Since Last visit / History of Present Illness: ?  Patient reports doing well since last visit. Continues to report inconsistent use with Trulicity  in part due to not liking injections but also in part due to GI side effects. Reports GI system feeling slow/sluggish. Specifically Stomach bloating, fullness. Constipation.    Upon further discussion, she notes inconsistent Trulicity  use also related to c/f harm. She had been Googling GLP1 medications and started to harbor  concerns regarding long term effects of GLP1s (specifically pancreatitis/gallbladder issues).   Reported DM Regimen: ?  Trulicity  0.75 mg  (using about every other week)   DM medications tried in the past:?  metformin (itchy)  SMBG: glucometer ?  FBG:  7/11: 148 - Skipped Trulicity  7/12: 175 7/13: 173 7/14: 182 7/15: 161 7/16: 176 7/17: 178 7/18: 187 - Took Trulicity  7/19: 133 7/20:  133 7/21: 146 7/22: 145   Hypo/Hyperglycemia: ?  Symptoms of hypoglycemia since last visit:? no  Symptoms of hyperglycemia since last visit:? no   Exercise: No formal exercise. Feels she has been less active since retirement. Interested in possible looking into classes or activities at the [employer].   DM Prevention:  Statin: Not taking, patient preference?  History of chronic kidney disease? no History of albuminuria? No record of UACR lab - DUE ACE/ARB - Not taking; Urine MA/CR Ratio - never measured  Last eye exam: 04/07/2022; No retinopathy present - DUE Last foot exam: 01/31/2023 Tobacco Use: Never smoker Immunizations:? Flu: No Record - DUE; Pneumococcal: No Record - DUE; Shingrix: No Record - DUE; Tdap: DUE (last 2013)  Cardiovascular Risk Reduction History of clinical ASCVD? no The 10-year ASCVD risk score (Arnett DK, et al., 2019) is: 29.6% History of heart failure? no History of hyperlipidemia? yes Current BMI: 33.6 kg/m2 (Ht 61.5 in, Wt 82.1 kg) Taking statin? no; Patient preference Taking aspirin? not indicated; Not taking   Taking SGLT-2i? no Taking GLP- 1 RA? yes    _______________________________________________  Objective    Review of Systems:? Limited in the setting of virtual visit Constitutional:? No fever, chills or unintentional weight loss  GI:? +Fullness/bloating, +constipation. No vomiting, diarrhea, abdominal pain, dyspepsia, change in bowel habits  Endocrine:? No polyuria, polyphagia or blurred vision    Physical Examination:  Vitals:  Wt Readings from Last 3 Encounters:  09/13/23 181 lb (82.1 kg)  08/21/23 182 lb (82.6 kg)  07/27/23 178 lb 6 oz (80.9 kg)   BP Readings from Last 3 Encounters:  09/13/23 (!) 172/90  08/21/23 136/84  07/27/23 116/74   Pulse Readings from Last 3 Encounters:  09/13/23 78  08/21/23 71  07/27/23 73     Labs:?  Lab Results  Component Value Date   HGBA1C 6.9 06/06/2023   HGBA1C 6.7 (H) 01/31/2023    HGBA1C 6.7 (H) 11/03/2022   GLUCOSE 170 (H) 05/30/2023   CREATININE 0.75 05/30/2023   CREATININE 0.91 04/27/2023   CREATININE 0.73 01/31/2023   GFR 84.55 05/30/2023   GFR 87.54 01/31/2023   GFR 76.32 09/30/2022    Lab Results  Component Value Date   CHOL 208 (H) 05/30/2023   LDLCALC 117 (H) 05/30/2023   LDLCALC 130 (H) 09/30/2022   LDLCALC 105 (H) 07/08/2022   HDL 81.20 05/30/2023   TRIG 50.0 05/30/2023   TRIG 61.0 09/30/2022   TRIG 48 07/08/2022   ALT 13 05/30/2023   ALT 16 04/27/2023   AST 19 05/30/2023   AST 19 04/27/2023      Chemistry      Component Value Date/Time   NA 141 05/30/2023 1005   NA 143 09/10/2021 1601   K 3.8 05/30/2023 1005   CL 106 05/30/2023 1005   CO2 30 05/30/2023 1005   BUN 10 05/30/2023 1005   BUN 9 09/10/2021 1601   CREATININE 0.75 05/30/2023 1005   CREATININE 0.91 04/27/2023 1339   CREATININE 0.70 09/25/2015 1504      Component Value Date/Time   CALCIUM  9.8 05/30/2023 1005   ALKPHOS 103 05/30/2023 1005   AST 19 05/30/2023 1005   AST 19 04/27/2023 1339   ALT 13 05/30/2023 1005   ALT 16 04/27/2023 1339   BILITOT 0.7 05/30/2023 1005   BILITOT 0.9 04/27/2023 1339     The 10-year ASCVD risk score (Arnett DK, et al., 2019) is: 29.6%  Assessment and Plan:   1. Diabetes, type 2: controlled per last A1c of 6.9% (06/06/23), increased from previous 6.7%. Goal <7% without hypoglycemia. Worsened controlled per SMBG with FBG readings consistently elevated >130 mg/dL 2/2 inconsistent use of Trulicity . On weeks following skipped dose, FBG as high as 180s mg/dL. Compliance in part due to aversion to injections/side effects though more so patient concerns with risks seen online (pancreatitis).  Long discussion today of data surrounding GLP1 warnings. Also reviewed benefits of DM medications related to her relevant disease states.  Current Regimen: Trulicity  0.75 mg (on average every 2 weeks) Exercise: Goal to review classes offered at [facility].  Consider small walks each day (also expected to improve constipation).   HCM: Due for UACR, Eye Exam Continue medications Patient preference to resume Trulicity  at weekly interval as opposed to trying different medication. Feels reassured after discussion of pancreatitis risks/incidence. TG WNL, abstains from alcohol.  Reviewed benefits of GLP1 as a class including A1c reduction, weight loss, renal protection, benefits demonstrated in FLD. Reviewed while on a GLP1RA, eating smaller meals, avoiding high-fat foods, and remaining upright after eating may reduce nausea.  Discussed expectation of side effect resolution with consistent weekly use of GLP1. Expect ongoing side effects with missed doses/inconsistent use.   Future Consideration: GLP1-RA: Continue at current dose. Previous well-controlled on low-dose Trulicity  0.75 mg/wk. Worsened glycemic control is specific to compliance. Ozempic  low dose reasonable as needed, though pen needle may not be preferable given aversion to injection. Rybelsus remains future option though may negatively impact compliance. SGLT2i: Reasonable agent though with prior reports of urinary complaints. Compelling if UACR elevation on future labs.  Metformin: Previous intolerance per  patient report.   SU: Not unreasonable, though defer given alternative options with lower risk of hypoglycemia.  TZD: Avoiding due to possible weight gain/increase in fracture risk. Documented Hx osteopenia.    2. ASCVD (primary prevention): Uncontrolled on last lipid panel with LDL 117 mg/dL, TG 50 mg/dL (6/74/74). LDL goal <70 mg/dL (primary prevention, diabetes).  Key risk factors include: diabetes, hypertension, hyperlipidemia, BMI >30 kg/m2, and sedentary lifestyle The 10-year ASCVD risk score (Arnett DK, et al., 2019) is: 29.6% indicated patient is at High risk.  Continue medications today without changes per patient preference to avoid medication.  Re-visit discussion once  re-established on GLP1 with good compliance.  Statin preferred, patient declines Consider Ezetimibe 10 mg/d  3. Healthcare Maintenance:  Pneumococcal - Current status: No record, DUE  Shingles - Current status: No record, DUE  Influenza - Current status: No record, DUE fall 2025  Due to receive the following vaccines: Tdap, Influenza, Shingrix, and PCV20   Follow Up Follow up with clinical pharmacist via phone in 4-6 weeks Patient given direct line for questions regarding medication therapy PCP TOC scheduled February  Future Appointments  Date Time Provider Department Center  09/26/2023  3:20 PM [facility] MM 3 [facility]  10/25/2023  3:00 PM [facility] None  10/25/2023  3:15 PM Timmy Maude SAUNDERS, MD [facility] None  10/31/2023 11:00 AM [facility] PHARMACIST [facility]  04/29/2024  2:00 PM Bair, Luke, MD [facility]    Manuelita FABIENE Kobs, PharmD Clinical Pharmacist [facility] [phone]

## 2023-09-27 ENCOUNTER — Encounter: Payer: Self-pay | Admitting: Obstetrics and Gynecology

## 2023-09-27 ENCOUNTER — Other Ambulatory Visit: Payer: Self-pay

## 2023-09-27 DIAGNOSIS — E119 Type 2 diabetes mellitus without complications: Secondary | ICD-10-CM

## 2023-09-27 MED ORDER — TRULICITY 0.75 MG/0.5ML ~~LOC~~ SOAJ: 0.7500 mg | SUBCUTANEOUS | 1 refills | Status: DC

## 2023-10-02 ENCOUNTER — Ambulatory Visit: Payer: Self-pay | Admitting: Obstetrics and Gynecology

## 2023-10-18 ENCOUNTER — Other Ambulatory Visit: Payer: Self-pay | Admitting: Hematology & Oncology

## 2023-10-24 ENCOUNTER — Other Ambulatory Visit: Payer: Self-pay

## 2023-10-24 DIAGNOSIS — L732 Hidradenitis suppurativa: Secondary | ICD-10-CM

## 2023-10-24 NOTE — Telephone Encounter (Signed)
 Error

## 2023-10-25 ENCOUNTER — Inpatient Hospital Stay (HOSPITAL_BASED_OUTPATIENT_CLINIC_OR_DEPARTMENT_OTHER): Payer: Federal, State, Local not specified - PPO | Admitting: Hematology & Oncology

## 2023-10-25 ENCOUNTER — Inpatient Hospital Stay: Payer: Federal, State, Local not specified - PPO | Attending: Hematology & Oncology

## 2023-10-25 ENCOUNTER — Encounter: Payer: Self-pay | Admitting: Hematology & Oncology

## 2023-10-25 VITALS — BP 138/69 | HR 73 | Temp 98.5°F | Resp 20 | Ht 62.0 in | Wt 178.1 lb

## 2023-10-25 DIAGNOSIS — D72819 Decreased white blood cell count, unspecified: Secondary | ICD-10-CM | POA: Diagnosis not present

## 2023-10-25 DIAGNOSIS — M3119 Other thrombotic microangiopathy: Secondary | ICD-10-CM | POA: Diagnosis not present

## 2023-10-25 DIAGNOSIS — E119 Type 2 diabetes mellitus without complications: Secondary | ICD-10-CM | POA: Diagnosis not present

## 2023-10-25 DIAGNOSIS — K7581 Nonalcoholic steatohepatitis (NASH): Secondary | ICD-10-CM | POA: Diagnosis not present

## 2023-10-25 DIAGNOSIS — K7460 Unspecified cirrhosis of liver: Secondary | ICD-10-CM | POA: Diagnosis not present

## 2023-10-25 LAB — CMP (CANCER CENTER ONLY)
ALT: 17 U/L (ref 0–44)
AST: 26 U/L (ref 15–41)
Albumin: 4.7 g/dL (ref 3.5–5.0)
Alkaline Phosphatase: 96 U/L (ref 38–126)
Anion gap: 10 (ref 5–15)
BUN: 11 mg/dL (ref 8–23)
CO2: 27 mmol/L (ref 22–32)
Calcium: 10.7 mg/dL — ABNORMAL HIGH (ref 8.9–10.3)
Chloride: 104 mmol/L (ref 98–111)
Creatinine: 0.79 mg/dL (ref 0.44–1.00)
GFR, Estimated: >60 mL/min (ref >60)
Glucose, Bld: 100 mg/dL — ABNORMAL HIGH (ref 70–99)
Potassium: 4.2 mmol/L (ref 3.5–5.1)
Sodium: 141 mmol/L (ref 135–145)
Total Bilirubin: 1.1 mg/dL (ref 0.0–1.2)
Total Protein: 7.8 g/dL (ref 6.5–8.1)

## 2023-10-25 LAB — RETICULOCYTES
Immature Retic Fract: 12.2 % (ref 2.3–15.9)
RBC.: 4.54 MIL/uL (ref 3.87–5.11)
Retic Count, Absolute: 64.5 K/uL (ref 19.0–186.0)
Retic Ct Pct: 1.4 % (ref 0.4–3.1)

## 2023-10-25 LAB — CBC WITH DIFFERENTIAL (CANCER CENTER ONLY)
Abs Immature Granulocytes: 0.01 K/uL (ref 0.00–0.07)
Basophils Absolute: 0 K/uL (ref 0.0–0.1)
Basophils Relative: 1 %
Eosinophils Absolute: 0.1 K/uL (ref 0.0–0.5)
Eosinophils Relative: 3 %
HCT: 41.1 % (ref 36.0–46.0)
Hemoglobin: 14.1 g/dL (ref 12.0–15.0)
Immature Granulocytes: 0 %
Lymphocytes Relative: 37 %
Lymphs Abs: 1.4 K/uL (ref 0.7–4.0)
MCH: 30.6 pg (ref 26.0–34.0)
MCHC: 34.3 g/dL (ref 30.0–36.0)
MCV: 89.2 fL (ref 80.0–100.0)
Monocytes Absolute: 0.3 K/uL (ref 0.1–1.0)
Monocytes Relative: 8 %
Neutro Abs: 1.9 K/uL (ref 1.7–7.7)
Neutrophils Relative %: 51 %
Platelet Count: 135 K/uL — ABNORMAL LOW (ref 150–400)
RBC: 4.61 MIL/uL (ref 3.87–5.11)
RDW: 12.7 % (ref 11.5–15.5)
WBC Count: 3.8 K/uL — ABNORMAL LOW (ref 4.0–10.5)
nRBC: 0 % (ref 0.0–0.2)

## 2023-10-25 LAB — HEMOGLOBIN A1C
Hgb A1c MFr Bld: 6.4 % — ABNORMAL HIGH (ref 4.8–5.6)
Mean Plasma Glucose: 136.98 mg/dL

## 2023-10-25 LAB — LACTATE DEHYDROGENASE: LDH: 153 U/L (ref 98–192)

## 2023-10-25 LAB — SAVE SMEAR(SSMR), FOR PROVIDER SLIDE REVIEW

## 2023-10-25 NOTE — Progress Notes (Signed)
 Hematology and Oncology Follow Up Visit  Vicki [name] [id number] October 04, 1960 63 y.o. 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  We last saw Vicki 6 months ago.  She is busy taking care of Vicki [name].  I must give Vicki credit for being so dedicated to Vicki [name].  She has been feeling well.  We last saw Vicki back in February.  She did see Vicki liver specialist at [facility] and April.  She has had no problems with bleeding.  She said that there is been a little bit of bleeding from the gums.  She does note a couple extra bruises.  She has had no fever.  She has had no nausea or vomiting.  She has had no cough or shortness of breath.  There has been no rashes.  She has had no weight loss or weight gain.  She does have NASH.  She is trying to watch Vicki caloric intake.  When we last saw Vicki.  Vicki ADAMTS-13 was greater than 100.  Overall, Vicki performance status is ECOG 1.    Medications:  Current Outpatient Medications:    Alum Hydroxide-Mag Carbonate (GAVISCON EXTRA STRENGTH PO), Take 1 tablet by mouth 4 (four) times daily as needed., Disp: , Rfl:    B-COMPLEX-C PO, Take 1 capsule by mouth every other day., Disp: , Rfl:    benzoyl peroxide  5 % external liquid, Apply topically 2 (two) times daily., Disp: 142 g, Rfl: 12   cholecalciferol (VITAMIN D3) 25 MCG (1000 UT) tablet, Take 1,000 Units by mouth daily., Disp: , Rfl:    Coenzyme Q10 (COQ10 PO), Take by mouth. (Patient taking differently: Take by mouth once a week.), Disp: , Rfl:    Dulaglutide  (TRULICITY ) 0.75 MG/0.5ML SOAJ, Inject 0.75 mg into the skin once a week., Disp: 12 mL, Rfl: 1   folic acid  (FOLVITE ) 1 MG tablet, Take 2 tablets by mouth once daily (Patient taking differently: Take 1 mg  by mouth daily.), Disp: 60 tablet, Rfl: 0   Lancets (ONETOUCH ULTRASOFT) lancets, 1 each by Other route daily at 12 noon. Use to check Glucose once daily as directed., Disp: 100 each, Rfl: 1   Methylcobalamin 5000 MCG SUBL, Place 500 mcg under the tongue daily., Disp: , Rfl:    Milk Thistle 1000 MG CAPS, Take 1 capsule by mouth daily., Disp: , Rfl:    mometasone  (ELOCON ) 0.1 % cream, APPLY  CREAM TOPICALLY ONCE DAILY, Disp: 45 g, Rfl: 0   Multiple Vitamin (MULTIVITAMIN PO), Take 1 tablet by mouth every other day., Disp: , Rfl:    ONETOUCH ULTRA test strip, USE  STRIP TO CHECK GLUCOSE ONCE TO TWICE DAILY AS DIRECTED, Disp: 100 each, Rfl: 0   Omega-3 Fatty Acids (OMEGA 3 FISH OIL PO), Take by mouth. (Patient not taking: Reported on 10/25/2023), Disp: , Rfl:   Allergies:  Allergies  Allergen Reactions   Metformin And Related Itching    Heart palpitation   Sulfa Antibiotics Itching and Other (See Comments)    Past Medical History, Surgical history, Social history, and Family History were reviewed and updated.  Review of Systems: Review of Systems  Constitutional: Negative.   HENT:  Negative.    Eyes: Negative.  Respiratory: Negative.    Cardiovascular:  Positive for leg swelling.  Gastrointestinal: Negative.   Endocrine: Negative.   Genitourinary: Negative.    Musculoskeletal:  Positive for arthralgias and myalgias.  Skin: Negative.   Neurological: Negative.   Hematological: Negative.   Psychiatric/Behavioral: Negative.      Physical Exam:  height is 5' 2 (1.575 m) and weight is 178 lb 1.9 oz (80.8 kg). Vicki oral temperature is 98.5 F (36.9 C). Vicki blood pressure is 138/69 and Vicki pulse is 73. Vicki respiration is 20 and oxygen saturation is 99%.   Wt Readings from Last 3 Encounters:  10/25/23 178 lb 1.9 oz (80.8 kg)  09/13/23 181 lb (82.1 kg)  08/21/23 182 lb (82.6 kg)    Physical Exam Vitals reviewed.  HENT:     Head: Normocephalic and atraumatic.  Eyes:     Pupils:  Pupils are equal, round, and reactive to light.  Cardiovascular:     Rate and Rhythm: Normal rate and regular rhythm.     Heart sounds: Normal heart sounds.  Pulmonary:     Effort: Pulmonary effort is normal.     Breath sounds: Normal breath sounds.  Abdominal:     General: Bowel sounds are normal.     Palpations: Abdomen is soft.  Musculoskeletal:        General: No tenderness or deformity. Normal range of motion.     Cervical back: Normal range of motion.     Comments: Extremities shows some 1+ edema in Vicki lower legs.  She has good range of motion of Vicki joints.  She has good strength in upper and lower extremities.  Lymphadenopathy:     Cervical: No cervical adenopathy.  Skin:    General: Skin is warm and dry.     Findings: No erythema or rash.  Neurological:     Mental Status: She is alert and oriented to person, place, and time.  Psychiatric:        Behavior: Behavior normal.        Thought Content: Thought content normal.        Judgment: Judgment normal.      Lab Results  Component Value Date   WBC 3.3 (L) 07/27/2023   HGB 13.4 07/27/2023   HCT 40.3 07/27/2023   MCV 89.9 07/27/2023   PLT 136.0 (L) 07/27/2023     Chemistry      Component Value Date/Time   NA 141 05/30/2023 1005   NA 143 09/10/2021 1601   K 3.8 05/30/2023 1005   CL 106 05/30/2023 1005   CO2 30 05/30/2023 1005   BUN 10 05/30/2023 1005   BUN 9 09/10/2021 1601   CREATININE 0.75 05/30/2023 1005   CREATININE 0.91 04/27/2023 1339   CREATININE 0.70 09/25/2015 1504      Component Value Date/Time   CALCIUM  9.8 05/30/2023 1005   ALKPHOS 103 05/30/2023 1005   AST 19 05/30/2023 1005   AST 19 04/27/2023 1339   ALT 13 05/30/2023 1005   ALT 16 04/27/2023 1339   BILITOT 0.7 05/30/2023 1005   BILITOT 0.9 04/27/2023 1339      Impression and Plan: [name] is a 63 year old African-American female.  She has acquired TTP.  She had relapsed.  She responded well to treatment with plasma exchange and  Rituxan .  She is now has been off treatment now for 5 years.    Again, I do not see any problems with respect to the TTP.  I did get Vicki blood  smear.  I do not see anything that looks like active TTP.  I really do not see any schistocytes.  As always, we will see what Vicki ADAMTS-13 level is.  This will always tell us  how things are going.  I know that she is busy taking care of Vicki [name].  There is always sounds like a full-time job.  However, she would do it every time.  We will see Vicki back in 6 months.    Maude JONELLE Crease, MD 8/20/20253:38 PM

## 2023-10-26 LAB — ADAMTS13 ACTIVITY REFLEX

## 2023-10-26 LAB — ADAMTS13 ACTIVITY: Adamts 13 Activity: 94.7 % (ref >66.8)

## 2023-10-31 ENCOUNTER — Other Ambulatory Visit

## 2023-11-07 ENCOUNTER — Other Ambulatory Visit (INDEPENDENT_AMBULATORY_CARE_PROVIDER_SITE_OTHER): Admitting: Pharmacist

## 2023-11-07 ENCOUNTER — Encounter: Payer: Self-pay | Admitting: Pharmacist

## 2023-11-07 DIAGNOSIS — E119 Type 2 diabetes mellitus without complications: Secondary | ICD-10-CM

## 2023-11-07 NOTE — Progress Notes (Signed)
Attempted to contact patient for scheduled appointment for medication management. Left HIPAA compliant message for patient to return my call at their convenience.   

## 2023-11-07 NOTE — Progress Notes (Unsigned)
 11/08/2023 Name: Vicki [name] MRN: [id number] DOB: [birth year]  Subjective  Chief Complaint  Patient presents with   Diabetes    Reason for visit: ?  Vicki [name] is a 63 y.o. female with a history of diabetes (type 2), who presents today for an initial diabetes pharmacotherapy visit.? Pertinent PMH also includes TTP, HTN, NASH, HLD, osteopenia, OAB.  Known DM Complications: peripheral neuropathy   Care Team: Primary Care Provider: No primary care provider on file.  PCP: Wash ? TOC  ? Bair (Feb) Doctor of the day: Dr. Allena Hamilton   Date of Last Diabetes Related Visit: with PCP on 08/21/23, 09/26/23 with PharmD  Recent Summary of Change: 7/22: Glp1 variable compliance 2/2 pt c/f gallbladder/pancreatitis risk w long-term use. Risks discussed in detail, patient amenable to resume weekly injections.  6/16: Not taking Trulicity  regularly. Declines Ryblesus at this time. PharmD referral to discuss options/adherence  Medication Access/Adherence: Prescription drug coverage: Payor: BLUE CROSS BLUE SHIELD / Plan: BCBS/FEDERAL EMP PPO / Product Type: *No Product type* / .  - Reports that all medications are affordable through her insurance.  - Current Patient Assistance: None International aid/development worker program)  Since Last visit / History of Present Illness: ?  Patient reports doing well since last visit. Has continued using Trulicity  about every other week. Reports this is due to stomach upset/concerns.    Notably, oncology team ordered an A1c which resulted at 6.5% though patient continues to note fasting sugars ~180 mg/dL. She states her current meter is ~64 years old, possibly older. Is open to new glucometer to see if any difference in readings. Prefers [employer].   Reported DM Regimen: ?  Trulicity  0.75 mg weekly (using about every other week)   DM medications tried in the past:?  metformin (itchy)  SMBG: glucometer. Not checking as consistently.  FBG: This morning  = 180 mg/dL   Hypo/Hyperglycemia: ?  Symptoms of hypoglycemia since last visit:? no  Symptoms of hyperglycemia since last visit:? no   Exercise: No formal exercise. Feels she has been less active since retirement. Interested in possible looking into classes or activities at the [employer].   DM Prevention:  Statin: Not taking, patient preference?  History of chronic kidney disease? no History of albuminuria? No record of UACR lab - DUE ACE/ARB - Not taking; Urine MA/CR Ratio - never measured  Last eye exam: 04/07/2022; No retinopathy present - DUE Last foot exam: 01/31/2023 Tobacco Use: Never smoker Immunizations:? Flu: No Record - DUE; Pneumococcal: No Record - DUE; Shingrix: No Record - DUE; Tdap: DUE (last 2013)  Cardiovascular Risk Reduction History of clinical ASCVD? no The 10-year ASCVD risk score (Arnett DK, et al., 2019) is: 18.3% History of heart failure? no History of hyperlipidemia? yes Current BMI: 33.6 kg/m2 (Ht 61.5 in, Wt 82.1 kg) Taking statin? no; Patient preference Taking aspirin? not indicated; Not taking   Taking SGLT-2i? no Taking GLP- 1 RA? yes    _______________________________________________  Objective    Review of Systems:? Limited in the setting of virtual visit Constitutional:? No fever, chills or unintentional weight loss  GI:? +Fullness/bloating, +constipation. No vomiting, diarrhea, abdominal pain, dyspepsia, change in bowel habits  Endocrine:? No polyuria, polyphagia or blurred vision    Physical Examination:  Vitals:  Wt Readings from Last 3 Encounters:  10/25/23 178 lb 1.9 oz (80.8 kg)  09/13/23 181 lb (82.1 kg)  08/21/23 182 lb (82.6 kg)   BP Readings from Last 3 Encounters:  10/25/23  138/69  09/13/23 (!) 172/90  08/21/23 136/84   Pulse Readings from Last 3 Encounters:  10/25/23 73  09/13/23 78  08/21/23 71     Labs:?  Lab Results  Component Value Date   HGBA1C 6.4 (H) 10/25/2023   HGBA1C 6.9 06/06/2023   HGBA1C 6.7 (H)  01/31/2023   GLUCOSE 100 (H) 10/25/2023   CREATININE 0.79 10/25/2023   CREATININE 0.75 05/30/2023   CREATININE 0.91 04/27/2023   GFR 84.55 05/30/2023   GFR 87.54 01/31/2023   GFR 76.32 09/30/2022    Lab Results  Component Value Date   CHOL 208 (H) 05/30/2023   LDLCALC 117 (H) 05/30/2023   LDLCALC 130 (H) 09/30/2022   LDLCALC 105 (H) 07/08/2022   HDL 81.20 05/30/2023   TRIG 50.0 05/30/2023   TRIG 61.0 09/30/2022   TRIG 48 07/08/2022   ALT 17 10/25/2023   ALT 13 05/30/2023   AST 26 10/25/2023   AST 19 05/30/2023      Chemistry      Component Value Date/Time   NA 141 10/25/2023 1508   NA 143 09/10/2021 1601   K 4.2 10/25/2023 1508   CL 104 10/25/2023 1508   CO2 27 10/25/2023 1508   BUN 11 10/25/2023 1508   BUN 9 09/10/2021 1601   CREATININE 0.79 10/25/2023 1508   CREATININE 0.70 09/25/2015 1504      Component Value Date/Time   CALCIUM  10.7 (H) 10/25/2023 1508   ALKPHOS 96 10/25/2023 1508   AST 26 10/25/2023 1508   ALT 17 10/25/2023 1508   BILITOT 1.1 10/25/2023 1508     The 10-year ASCVD risk score (Arnett DK, et al., 2019) is: 18.3%  Assessment and Plan:   1. Diabetes, type 2: controlled per last A1c of 6.4% (10/25/23), improved from 6.9% (4/1). Goal <7% without hypoglycemia. Patient still noting FBG readings in the 180s indicating inaccuracy of either the glucometer or A1c result. Current meter >75 years old, will try for new Rx. Pancytopenia may impact A1c in some cases. RBC consistently WNL the past several years, reticulocyte count WNL on recent labs as well.  Current Regimen: Trulicity  0.75 mg weekly  Exercise: Goal to review classes offered at [facility]. Consider small walks each day (also expected to improve constipation).   HCM: Due for UACR, Eye Exam Continue medications New Glucometer Rx per c/f accuracy given significant discrepancy w A1c.   Future Consideration: GLP1-RA: Continue at current dose. Previous well-controlled on low-dose Trulicity  0.75  mg/wk. Worsened glycemic control is specific to compliance. Ozempic  low dose reasonable as needed, though pen needle may not be preferable given aversion to injection. Rybelsus remains future option though may negatively impact compliance. SGLT2i: Reasonable agent though defer with prior reports of urinary complaints given good glycemic control on current therapies. Compelling if UACR elevation on future labs.  Metformin: Previous intolerance per patient report.   SU: Not unreasonable, though defer given alternative options with lower risk of hypoglycemia.  TZD: Avoiding due to possible weight gain/increase in fracture risk. Documented Hx osteopenia.    2. ASCVD (primary prevention): Uncontrolled on last lipid panel with LDL 117 mg/dL, TG 50 mg/dL (6/74/74). LDL goal <70 mg/dL (primary prevention, diabetes).  Key risk factors include: diabetes, hypertension, hyperlipidemia, BMI >30 kg/m2, and sedentary lifestyle The 10-year ASCVD risk score (Arnett DK, et al., 2019) is: 18.3% indicated patient is at High risk.  Continue medications today without changes per patient preference to avoid medication.  Re-visit discussion once re-established on GLP1 with good  compliance.  Statin preferred, patient declines Consider Ezetimibe 10 mg/d  3. Healthcare Maintenance:  Pneumococcal - Current status: No record, DUE  Shingles - Current status: No record, DUE  Influenza - Current status: No record, DUE fall 2025  Due to receive the following vaccines: Tdap, Influenza, Shingrix, and PCV20   Follow Up Follow up with clinical pharmacist via phone 2-4 weeks to review new glucometer data Patient given direct line for questions regarding medication therapy PCP TOC scheduled February  Future Appointments  Date Time Provider Department Center  11/28/2023 11:30 AM [facility] PHARMACIST [facility] [address]  04/26/2024  2:45 PM [facility] LAB [facility] None  04/26/2024  3:00 PM Ennever, Maude SAUNDERS, MD [facility] None   04/29/2024  2:00 PM Bair, Luke, MD [facility] [address] FABIENE Geronimo, PharmD Clinical Pharmacist [facility] [phone]

## 2023-11-08 MED ORDER — BLOOD GLUCOSE MONITORING SUPPL DEVI: 0 refills | Status: AC

## 2023-11-20 ENCOUNTER — Other Ambulatory Visit: Payer: Self-pay | Admitting: Hematology & Oncology

## 2023-11-20 DIAGNOSIS — M3119 Other thrombotic microangiopathy: Secondary | ICD-10-CM

## 2023-11-28 ENCOUNTER — Other Ambulatory Visit (INDEPENDENT_AMBULATORY_CARE_PROVIDER_SITE_OTHER): Admitting: Pharmacist

## 2023-11-28 DIAGNOSIS — E118 Type 2 diabetes mellitus with unspecified complications: Secondary | ICD-10-CM

## 2023-11-28 MED ORDER — LANCETS MISC: 4 refills | Status: AC

## 2023-11-28 MED ORDER — BLOOD GLUCOSE TEST STRIPS 333 VI STRP: ORAL_STRIP | 4 refills | Status: AC

## 2023-11-28 NOTE — Progress Notes (Signed)
 11/28/2023 Name: Vicki [name] MRN: [id number] DOB: 1960/08/14  Subjective  Chief Complaint  Patient presents with   Diabetes    Reason for visit: ?  Vicki [name] is a 63 y.o. female with a history of diabetes (type 2), who presents today for an initial diabetes pharmacotherapy visit.? Pertinent PMH also includes TTP, HTN, NASH, HLD, osteopenia, OAB.  Known DM Complications: peripheral neuropathy   Care Team: Primary Care Provider: No primary care provider on file.  PCP: Wash ? TOC  ? Bair (Feb) Doctor of the day: Dr. Allena Hamilton   Date of Last Diabetes Related Visit: with PCP on 08/21/23, 09/26/23 with PharmD  Recent Summary of Change: 9/2: A1c 6.4%, though glucometer consistently elevated, 180 mg/dL fasting today. New glucometer Rx.  7/22: Glp1 variable compliance 2/2 pt c/f gallbladder/pancreatitis risk w long-term use. Risks discussed in detail, patient amenable to resume weekly injections.  6/16: Not taking Trulicity  regularly. Declines Ryblesus at this time. PharmD referral to discuss options/adherence  Medication Access/Adherence: Prescription drug coverage: Payor: BLUE CROSS BLUE SHIELD / Plan: BCBS/FEDERAL EMP PPO / Product Type: *No Product type* / .  - Reports that all medications are affordable through her insurance.  - Current Patient Assistance: None International aid/development worker program)  Since Last visit / History of Present Illness: ?  Patient reports doing well since last visit. Has continued using Trulicity  weekly to every other week.  Notably, last visit, patient reported consistent FBG ~180 mg/dL despite recent J8r 3.5%. Glucometer 30+ years old, Rx for new meter sent in to rule out possible error with older meter. Patient confirms obtaining new glucometer though the pharmacy supplied a new brand. Did not supply her with lancets or test strips. A small quantity did come in the starter kit though she has reverted to using her old glucometer in the meantime.    Reported DM Regimen: ?  Trulicity  0.75 mg weekly (using about every other week)   DM medications tried in the past:?  metformin (itchy)  SMBG: glucometer 9/18 (noon) 139 9/19 (2:45 am) 164, (11am) 143, (10pm) 118 9/21: (4:30 pm) 121 9/22: 4am (137), (11:45am) 163 9/23: (7am) 144, (10:30) 139   Hypo/Hyperglycemia: ?  Symptoms of hypoglycemia since last visit:? no  Symptoms of hyperglycemia since last visit:? no   Exercise: No formal exercise. Feels she has been less active since retirement. Interested in possibly looking into classes or activities at the [employer]. May start walking.   Diet: Reports goal to start cutting back on carbs. Reports her diet currently consists of a lot of carbs/potatoes.   DM Prevention:  Statin: Not taking, patient preference?  History of chronic kidney disease? no History of albuminuria? No record of UACR lab - DUE ACE/ARB - Not taking; Urine MA/CR Ratio - never measured  Last eye exam: 04/07/2022; No retinopathy present - DUE Last foot exam: 01/31/2023 Tobacco Use: Never smoker Immunizations:? Flu: No Record - DUE; Pneumococcal: No Record - DUE; Shingrix: No Record - DUE; Tdap: DUE (last 2013)  Cardiovascular Risk Reduction History of clinical ASCVD? no The 10-year ASCVD risk score (Arnett DK, et al., 2019) is: 18.3% History of heart failure? no History of hyperlipidemia? yes Current BMI: 33.6 kg/m2 (Ht 61.5 in, Wt 82.1 kg) Taking statin? no; Patient preference Taking aspirin? not indicated; Not taking   Taking SGLT-2i? no Taking GLP- 1 RA? yes    _______________________________________________  Objective    Review of Systems:? Limited in the setting of virtual visit Constitutional:? No  fever, chills or unintentional weight loss  Endocrine:? No polyuria, polyphagia or blurred vision    Physical Examination:  Vitals:  Wt Readings from Last 3 Encounters:  10/25/23 178 lb 1.9 oz (80.8 kg)  09/13/23 181 lb (82.1 kg)  08/21/23 182  lb (82.6 kg)   BP Readings from Last 3 Encounters:  10/25/23 138/69  09/13/23 (!) 172/90  08/21/23 136/84   Pulse Readings from Last 3 Encounters:  10/25/23 73  09/13/23 78  08/21/23 71     Labs:?  Lab Results  Component Value Date   HGBA1C 6.4 (H) 10/25/2023   HGBA1C 6.9 06/06/2023   HGBA1C 6.7 (H) 01/31/2023   GLUCOSE 100 (H) 10/25/2023   CREATININE 0.79 10/25/2023   CREATININE 0.75 05/30/2023   CREATININE 0.91 04/27/2023   GFR 84.55 05/30/2023   GFR 87.54 01/31/2023   GFR 76.32 09/30/2022    Lab Results  Component Value Date   CHOL 208 (H) 05/30/2023   LDLCALC 117 (H) 05/30/2023   LDLCALC 130 (H) 09/30/2022   LDLCALC 105 (H) 07/08/2022   HDL 81.20 05/30/2023   TRIG 50.0 05/30/2023   TRIG 61.0 09/30/2022   TRIG 48 07/08/2022   ALT 17 10/25/2023   ALT 13 05/30/2023   AST 26 10/25/2023   AST 19 05/30/2023      Chemistry      Component Value Date/Time   NA 141 10/25/2023 1508   NA 143 09/10/2021 1601   K 4.2 10/25/2023 1508   CL 104 10/25/2023 1508   CO2 27 10/25/2023 1508   BUN 11 10/25/2023 1508   BUN 9 09/10/2021 1601   CREATININE 0.79 10/25/2023 1508   CREATININE 0.70 09/25/2015 1504      Component Value Date/Time   CALCIUM  10.7 (H) 10/25/2023 1508   ALKPHOS 96 10/25/2023 1508   AST 26 10/25/2023 1508   ALT 17 10/25/2023 1508   BILITOT 1.1 10/25/2023 1508     The 10-year ASCVD risk score (Arnett DK, et al., 2019) is: 18.3%  Assessment and Plan:   1. Diabetes, type 2: controlled per last A1c of 6.4% (10/25/23), improved from 6.9% (4/1). Goal <7% without hypoglycemia. Previous visit, had reported FBG readings in the 180s consistently. New glucometer sent though needs additional testing supplies. Over past 2 weeks, with old glucometer, sugars have improved though remain higher than expected per A1c reading of 6.5%.  No clear reasons for A1c inaccuracies. Pancytopenia may impact A1c in some cases. RBC consistently WNL the past several years,  reticulocyte count WNL on recent labs as well.  Current Regimen: Trulicity  0.75 mg weekly  Exercise: Goal to review classes offered at [employer]. Consider small walks each day (also expected to improve constipation).   HCM: Due for UACR, Eye Exam Continue medications Rx refill for new brand of Lancets and test strips  Future Consideration: GLP1-RA: Continue at current dose. Previously well-controlled on low-dose Trulicity  0.75 mg/wk. Worsened glycemic control is specific to compliance. Ozempic  low dose reasonable as needed, though pen needle may not be preferable given aversion to injection. Rybelsus remains future option though may negatively impact compliance. SGLT2i: Reasonable agent though defer with prior reports of urinary complaints given good glycemic control on current therapies. Compelling if UACR elevation on future labs.  Metformin: Previous intolerance per patient report.   SU: Not unreasonable, though defer given alternative options with lower risk of hypoglycemia.  TZD: Avoiding due to possible weight gain/increase in fracture risk. Documented Hx osteopenia.    2. ASCVD (primary  prevention): Uncontrolled on last lipid panel with LDL 117 mg/dL, TG 50 mg/dL (6/74/74). LDL goal <70 mg/dL (primary prevention, diabetes).  Key risk factors include: diabetes, hypertension, hyperlipidemia, BMI >30 kg/m2, and sedentary lifestyle The 10-year ASCVD risk score (Arnett DK, et al., 2019) is: 18.3% indicated patient is at High risk.  Continue medications today without changes per patient preference to avoid medication.  Re-visit discussion once re-established on GLP1 with good compliance.  Statin preferred, patient declines Consider Ezetimibe 10 mg/d   3. Healthcare Maintenance:  Pneumococcal - Current status: No record, DUE  Shingles - Current status: No record, DUE  Influenza - Current status: No record, DUE fall 2025  Due to receive the following vaccines: Tdap, Influenza,  Shingrix, and PCV20   Follow Up Follow up with clinical pharmacist via phone 2 weeks to review new glucometer data Patient given direct line for questions regarding medication therapy PCP TOC scheduled February  Future Appointments  Date Time Provider Department Center  12/12/2023 11:30 AM [facility] PHARMACIST [facility] [address]  12/12/2023  3:00 PM [facility]  04/26/2024  2:45 PM [facility] None  04/26/2024  3:00 PM Ennever, Maude SAUNDERS, MD [facility] None  04/29/2024  2:00 PM Bair, Luke, MD [facility] [address] FABIENE Geronimo, PharmD Clinical Pharmacist [facility] [phone]

## 2023-12-05 ENCOUNTER — Other Ambulatory Visit: Payer: Self-pay | Admitting: Hematology & Oncology

## 2023-12-07 DIAGNOSIS — D61818 Other pancytopenia: Secondary | ICD-10-CM | POA: Diagnosis not present

## 2023-12-07 DIAGNOSIS — K7581 Nonalcoholic steatohepatitis (NASH): Secondary | ICD-10-CM | POA: Diagnosis not present

## 2023-12-07 DIAGNOSIS — K7469 Other cirrhosis of liver: Secondary | ICD-10-CM | POA: Diagnosis not present

## 2023-12-12 ENCOUNTER — Other Ambulatory Visit: Admitting: Pharmacist

## 2023-12-12 ENCOUNTER — Ambulatory Visit (HOSPITAL_BASED_OUTPATIENT_CLINIC_OR_DEPARTMENT_OTHER)
Admission: RE | Admit: 2023-12-12 | Discharge: 2023-12-12 | Disposition: A | Discharge status: Home / Self Care | Source: Ambulatory Visit | Attending: Obstetrics and Gynecology | Admitting: Obstetrics and Gynecology

## 2023-12-12 DIAGNOSIS — Z780 Asymptomatic menopausal state: Secondary | ICD-10-CM | POA: Diagnosis not present

## 2023-12-12 DIAGNOSIS — M810 Age-related osteoporosis without current pathological fracture: Secondary | ICD-10-CM | POA: Diagnosis not present

## 2023-12-12 DIAGNOSIS — M8589 Other specified disorders of bone density and structure, multiple sites: Secondary | ICD-10-CM | POA: Insufficient documentation

## 2023-12-12 DIAGNOSIS — E119 Type 2 diabetes mellitus without complications: Secondary | ICD-10-CM

## 2023-12-12 NOTE — Progress Notes (Signed)
 12/12/2023 Name: Vicki [name] MRN: [id number] DOB: 1960-09-17  Subjective  Chief Complaint  Patient presents with   Diabetes    Reason for visit: ?  Vicki [name] is a 63 y.o. female with a history of diabetes (type 2), who presents today for an initial diabetes pharmacotherapy visit.? Pertinent PMH also includes TTP, HTN, NASH, HLD, osteopenia, OAB.  Known DM Complications: peripheral neuropathy   Care Team: PCP: Wash ? TOC Bair (Feb) Doctor of the day: Dr. Allena Hamilton   Date of Last Diabetes Related Visit: with PCP on 08/21/23, 09/26/23 with PharmD  Recent Summary of Change: 9/2: A1c 6.4%, though glucometer consistently elevated, 180 mg/dL fasting today. New glucometer Rx sent to pharmacy.  7/22: Glp1 variable compliance 2/2 pt c/f gallbladder/pancreatitis risk w long-term use. Risks discussed in detail, patient amenable to resume weekly injections.  6/16: Not taking Trulicity  regularly. Declines Ryblesus at this time. PharmD referral to discuss options/adherence  Medication Access/Adherence: Prescription drug coverage: Payor: BLUE CROSS BLUE SHIELD / Plan: BCBS/[employer] PPO / Product Type: *No Product type* / .  - Reports that all medications are affordable through her insurance.  - Current Patient Assistance: None International aid/development worker program)  Since Last visit / History of Present Illness: ?  Patient reports doing well since last visit. Has continued using Trulicity  weekly to every other week due to bloating.   Notably, at recent visit, patient reported consistent FBG ~180 mg/dL despite recent J8r 3.5%. Glucometer 68+ years old, Rx for new meter sent in to rule out possible error with older meter. Last visit, patient had gotten new meter, though no test strips. Today, patient confirms she has all supplies and has been using her new glucometer without issues.  Reported DM Regimen: ?  Trulicity  0.75 mg weekly (using about every other week)   DM medications tried  in the past:?  metformin (itchy)  SMBG: glucometer 10/2 (11:30 am) 178 10/3 (12:05 am) 132 10/7 (3:30 am) 144, (3pm) 114 10/5 (4:15pm) 122 10/6 (1:00) 136 10/7 (10:30am): 138  Hypo/Hyperglycemia: ?  Symptoms of hypoglycemia since last visit:? no  Symptoms of hyperglycemia since last visit:? no   Exercise: No formal exercise. Feels she has been less active since retirement. Interested in possibly looking into classes or activities at the [employer]. May start walking. Reiterates this as a goal today.   Diet: Reports goal to start cutting back on carbs. Reports her diet currently consists of a lot of carbs/potatoes.   DM Prevention:  Statin: Not taking, patient preference?  History of chronic kidney disease? no History of albuminuria? No record of UACR lab - DUE ACE/ARB - Not taking; Urine MA/CR Ratio - never measured  Last eye exam: 04/07/2022; No retinopathy present - DUE Last foot exam: 01/31/2023 Tobacco Use: Never smoker Immunizations:? Flu: No Record - DUE; Pneumococcal: No Record - DUE; Shingrix: No Record - DUE; Tdap: DUE (last 2013)  Cardiovascular Risk Reduction History of clinical ASCVD? no The 10-year ASCVD risk score (Arnett DK, et al., 2019) is: 18.3% History of heart failure? no History of hyperlipidemia? yes Current BMI: 33.6 kg/m2 (Ht 61.5 in, Wt 82.1 kg) Taking statin? no; Patient preference Taking aspirin? not indicated; Not taking   Taking SGLT-2i? no Taking GLP- 1 RA? yes    _______________________________________________  Objective    Review of Systems:? Limited in the setting of virtual visit Constitutional:? No fever, chills or unintentional weight loss  Endocrine:? No polyuria, polyphagia or blurred vision  Physical Examination:  Vitals:  Wt Readings from Last 3 Encounters:  10/25/23 178 lb 1.9 oz (80.8 kg)  09/13/23 181 lb (82.1 kg)  08/21/23 182 lb (82.6 kg)   BP Readings from Last 3 Encounters:  10/25/23 138/69  09/13/23 (!) 172/90   08/21/23 136/84   Pulse Readings from Last 3 Encounters:  10/25/23 73  09/13/23 78  08/21/23 71     Labs:?  Lab Results  Component Value Date   HGBA1C 6.4 (H) 10/25/2023   HGBA1C 6.9 06/06/2023   HGBA1C 6.7 (H) 01/31/2023   GLUCOSE 100 (H) 10/25/2023   CREATININE 0.79 10/25/2023   CREATININE 0.75 05/30/2023   CREATININE 0.91 04/27/2023   GFR 84.55 05/30/2023   GFR 87.54 01/31/2023   GFR 76.32 09/30/2022   Lab Results  Component Value Date   CHOL 208 (H) 05/30/2023   LDLCALC 117 (H) 05/30/2023   LDLCALC 130 (H) 09/30/2022   LDLCALC 105 (H) 07/08/2022   HDL 81.20 05/30/2023   TRIG 50.0 05/30/2023   TRIG 61.0 09/30/2022   TRIG 48 07/08/2022   ALT 17 10/25/2023   ALT 13 05/30/2023   AST 26 10/25/2023   AST 19 05/30/2023      Chemistry      Component Value Date/Time   NA 141 10/25/2023 1508   NA 143 09/10/2021 1601   K 4.2 10/25/2023 1508   CL 104 10/25/2023 1508   CO2 27 10/25/2023 1508   BUN 11 10/25/2023 1508   BUN 9 09/10/2021 1601   CREATININE 0.79 10/25/2023 1508   CREATININE 0.70 09/25/2015 1504      Component Value Date/Time   CALCIUM  10.7 (H) 10/25/2023 1508   ALKPHOS 96 10/25/2023 1508   AST 26 10/25/2023 1508   ALT 17 10/25/2023 1508   BILITOT 1.1 10/25/2023 1508     The 10-year ASCVD risk score (Arnett DK, et al., 2019) is: 18.3%  Assessment and Plan:   1. Diabetes, type 2: controlled per last A1c of 6.4% (10/25/23), improved from 6.9% (4/1). Goal <7% without hypoglycemia. At recent visit, had reported FBG readings in the 180s consistently. New glucometer shows sugars in more reasonable range. Notably, times checked are random, though early morning tend to be 110s-130s.  Current Regimen: Trulicity  0.75 mg weekly  Exercise: Goal to review classes offered at [employer]. Consider small walks each day).   HCM: Due for UACR, Eye Exam Continue medications without change Continue SMBG. Report any concerns with changes in home readings or c/f  lows.   Future Consideration: GLP1-RA: Continue at current dose. Previously well-controlled on low-dose Trulicity  0.75 mg/wk. Ozempic  low dose reasonable as needed, though pen needle may not be preferable given aversion to injection. Rybelsus remains future option though may negatively impact compliance. SGLT2i: Reasonable agent though defer with prior reports of urinary complaints given good glycemic control on current therapies. Compelling if UACR elevation on future labs.  Metformin: Previous intolerance per patient report.   SU: Not unreasonable, though defer given alternative options with lower risk of hypoglycemia.  TZD: Avoiding due to possible weight gain/increase in fracture risk. Documented Hx osteopenia.    2. ASCVD (primary prevention): Uncontrolled on last lipid panel with LDL 117 mg/dL, TG 50 mg/dL (6/74/74). LDL goal <70 mg/dL (primary prevention, diabetes).  Key risk factors include: diabetes, hypertension, hyperlipidemia, BMI >30 kg/m2, and sedentary lifestyle The 10-year ASCVD risk score (Arnett DK, et al., 2019) is: 18.3% indicated patient is at High risk.  Continue medications today without changes per  patient preference to avoid medication.  Statin preferred, patient declines Consider Ezetimibe 10 mg/d   3. Healthcare Maintenance:  Pneumococcal - Current status: No record, DUE  Shingles - Current status: No record, DUE  Influenza - Current status: No record, DUE fall 2025  Due to receive the following vaccines: Tdap, Influenza, Shingrix, and PCV20   Follow Up Patient given direct line for questions regarding medication therapy PCP TOC scheduled February  Future Appointments  Date Time Provider Department Center  12/12/2023  3:00 PM [facility]  04/26/2024  2:45 PM [facility] None  04/26/2024  3:00 PM Ennever, Maude SAUNDERS, MD [facility] None  04/29/2024  2:00 PM Bair, Luke, MD [facility] [address] FABIENE Geronimo, PharmD Clinical  Pharmacist [facility] [phone]

## 2023-12-13 DIAGNOSIS — H04123 Dry eye syndrome of bilateral lacrimal glands: Secondary | ICD-10-CM | POA: Diagnosis not present

## 2023-12-13 DIAGNOSIS — H2513 Age-related nuclear cataract, bilateral: Secondary | ICD-10-CM | POA: Diagnosis not present

## 2023-12-13 DIAGNOSIS — H40013 Open angle with borderline findings, low risk, bilateral: Secondary | ICD-10-CM | POA: Diagnosis not present

## 2023-12-13 DIAGNOSIS — E119 Type 2 diabetes mellitus without complications: Secondary | ICD-10-CM | POA: Diagnosis not present

## 2023-12-19 DIAGNOSIS — M48061 Spinal stenosis, lumbar region without neurogenic claudication: Secondary | ICD-10-CM | POA: Diagnosis not present

## 2023-12-19 DIAGNOSIS — M5451 Vertebrogenic low back pain: Secondary | ICD-10-CM | POA: Diagnosis not present

## 2023-12-21 ENCOUNTER — Ambulatory Visit: Payer: Self-pay | Admitting: Obstetrics and Gynecology

## 2023-12-21 DIAGNOSIS — M816 Localized osteoporosis [Lequesne]: Secondary | ICD-10-CM

## 2024-01-03 DIAGNOSIS — R791 Abnormal coagulation profile: Secondary | ICD-10-CM | POA: Diagnosis not present

## 2024-01-03 DIAGNOSIS — Z2821 Immunization not carried out because of patient refusal: Secondary | ICD-10-CM | POA: Diagnosis not present

## 2024-01-03 DIAGNOSIS — K7460 Unspecified cirrhosis of liver: Secondary | ICD-10-CM | POA: Diagnosis not present

## 2024-01-03 DIAGNOSIS — K766 Portal hypertension: Secondary | ICD-10-CM | POA: Diagnosis not present

## 2024-01-03 DIAGNOSIS — D61818 Other pancytopenia: Secondary | ICD-10-CM | POA: Diagnosis not present

## 2024-01-03 DIAGNOSIS — E119 Type 2 diabetes mellitus without complications: Secondary | ICD-10-CM | POA: Diagnosis not present

## 2024-01-03 DIAGNOSIS — K7581 Nonalcoholic steatohepatitis (NASH): Secondary | ICD-10-CM | POA: Diagnosis not present

## 2024-01-03 DIAGNOSIS — Z23 Encounter for immunization: Secondary | ICD-10-CM | POA: Diagnosis not present

## 2024-01-08 DIAGNOSIS — K766 Portal hypertension: Secondary | ICD-10-CM | POA: Insufficient documentation

## 2024-01-09 ENCOUNTER — Telehealth: Admitting: Obstetrics and Gynecology

## 2024-01-24 ENCOUNTER — Encounter: Payer: Self-pay | Admitting: Hematology & Oncology

## 2024-01-25 ENCOUNTER — Ambulatory Visit: Admitting: Family

## 2024-01-29 ENCOUNTER — Ambulatory Visit: Admitting: Obstetrics and Gynecology

## 2024-01-29 ENCOUNTER — Other Ambulatory Visit (HOSPITAL_COMMUNITY): Payer: Self-pay

## 2024-02-07 DIAGNOSIS — K769 Liver disease, unspecified: Secondary | ICD-10-CM | POA: Diagnosis not present

## 2024-02-12 ENCOUNTER — Ambulatory Visit: Attending: Cardiovascular Disease | Admitting: Cardiovascular Disease

## 2024-02-12 ENCOUNTER — Encounter: Payer: Self-pay | Admitting: Cardiovascular Disease

## 2024-02-12 VITALS — BP 154/74 | HR 73 | Ht 62.0 in | Wt 180.0 lb

## 2024-02-12 DIAGNOSIS — E782 Mixed hyperlipidemia: Secondary | ICD-10-CM | POA: Diagnosis not present

## 2024-02-12 DIAGNOSIS — R002 Palpitations: Secondary | ICD-10-CM

## 2024-02-12 DIAGNOSIS — I10 Essential (primary) hypertension: Secondary | ICD-10-CM

## 2024-02-12 NOTE — Assessment & Plan Note (Signed)
 History of hyperlipidemia not on statin therapy with lipid profile performed 05/30/2023 revealing total cholesterol 208, LDL 117 and HDL of 81, near goal for primary prevention with given acute coronary calcium  score of 0.

## 2024-02-12 NOTE — Progress Notes (Signed)
 02/12/2024 Vicki [name]   1961-01-07  [id number]  Primary Physician No primary care provider on file. Primary Cardiologist: Dorn JINNY Lesches MD GENI CODY MADEIRA, FSCAI  HPI:  Vicki [name] is a 63 y.o.   moderately overweight single African-American female mother of one child who was a research scientist (physical sciences) (retired in 2020) and was referred by Dr. Luke for cardiac evaluation because of new onset palpitations. I last saw her in the office   07/03/23. Factors include treated hypertension and diabetes. Her mother did have a myocardial infarction at age 64. She has never had a heart attack or stroke and denies chest pain or shortness of breath. She is in her perimenopause time window is followed by Dr. Lavoie. She drinks 2 cups of caffeinated beverages a day as well as alcohol on the weekends. She is not under a lot of stress recently. A 2-D echocardiogram was normal and an event monitor showed sinus rhythm sinus/sinus tachycardia and frequent PVCs. She had decreased her caffeine intake which improved her symptoms a year ago. However recently, she's noticed increased heart rate and palpitations are somewhat different quality than her previous symptoms. She also was diagnosed with NASH.   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  She is drinking less caffeine and alcohol since I saw her last her palpitations are much less frequent and noticeable.  She had some atypical chest pain when they removed her indwelling catheter but none since.   She had a CTA performed 10/20/2021 revealed a coronary calcium  score 0 with mild nonobstructive CAD.  Since I saw her 8 months ago she remained stable.  She still complains of occasional palpitations.  She denies chest pain or shortness of breath.   Current Meds  Medication Sig   Alum Hydroxide-Mag Carbonate (GAVISCON EXTRA STRENGTH PO) Take 1 tablet by mouth 4 (four) times daily as needed.   B-COMPLEX-C PO Take 1 capsule by mouth  every other day.   benzoyl peroxide  5 % external liquid Apply topically 2 (two) times daily.   Blood Glucose Monitoring Suppl DEVI Use to check blood glucose as instructed. May substitute to any manufacturer covered by patient's insurance. OneTouch Ultra = current (replacement needed).   cholecalciferol (VITAMIN D3) 25 MCG (1000 UT) tablet Take 1,000 Units by mouth daily.   Dulaglutide  (TRULICITY ) 0.75 MG/0.5ML SOAJ Inject 0.75 mg into the skin once a week.   folic acid  (FOLVITE ) 1 MG tablet Take 2 tablets by mouth once daily   Glucose Blood (BLOOD GLUCOSE TEST STRIPS 333) STRP Use to check blood sugar up to 3 times daily (or frequency that is covered by insurance). Please dispense brand consistent with glucometer dispensed last month. E11.65.   Lancets MISC Use to check blood sugar up to 3 times daily (or frequency that is covered by insurance). Please dispense brand consistent with glucometer dispensed last month. E11.65.   Methylcobalamin 5000 MCG SUBL Place 500 mcg under the tongue daily.   Milk Thistle 1000 MG CAPS Take 1 capsule by mouth daily.   mometasone  (ELOCON ) 0.1 % cream APPLY 1 CREAM TOPICALLY TO AFFECTED AREA ONCE DAILY   Multiple Vitamin (MULTIVITAMIN PO) Take 1 tablet by mouth every other day.     Allergies  Allergen Reactions   Metformin And Related Itching    Heart palpitation   Sulfa Antibiotics Itching and Other (See Comments)    Social History   Socioeconomic History   Marital status: Single  Spouse name: Not on file   Number of children: 1   Years of education: Not on file   Highest education level: Some college, no degree  Occupational History    Employer: [employer]  Tobacco Use   Smoking status: Never   Smokeless tobacco: Never  Vaping Use   Vaping status: Never Used  Substance and Sexual Activity   Alcohol use: Yes    Comment: 2 a month   Drug use: No   Sexual activity: Not Currently    Partners: Male    Birth control/protection: Post-menopausal  Other  Topics Concern   Not on file  Social History Narrative   02/26/19   From: the area   Living: lives with roommate - they get along   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      Family: Daughter - Higher Education Careers Adviser - good relationship currently age 24       Enjoys: watch TV and read      Exercise: dancing, on her own   Diet: does not follow diabetic diet, not eating as much      Safety   Seat belts: Yes    Guns: No   Safe in relationships: Yes        Social Drivers of Corporate Investment Banker Strain: Low Risk  (05/22/2023)   Overall Financial Resource Strain (CARDIA)    Difficulty of Paying Living Expenses: Not very hard  Food Insecurity: No Food Insecurity (05/22/2023)   Hunger Vital Sign    Worried About Running Out of Food in the Last Year: Never true    Ran Out of Food in the Last Year: Never true  Transportation Needs: No Transportation Needs (05/22/2023)   PRAPARE - Administrator, Civil Service (Medical): No    Lack of Transportation (Non-Medical): No  Physical Activity: Insufficiently Active (05/22/2023)   Exercise Vital Sign    Days of Exercise per Week: 1 day    Minutes of Exercise per Session: 30 min  Stress: No Stress Concern Present (05/22/2023)   [employer] of Occupational Health - Occupational Stress Questionnaire    Feeling of Stress : Only a little  Social Connections: Moderately Isolated (05/22/2023)   Social Connection and Isolation Panel    Frequency of Communication with Friends and Family: More than three times a week    Frequency of Social Gatherings with Friends and Family: More than three times a week    Attends Religious Services: More than 4 times per year    Active Member of [employer] or Organizations: No    Attends Engineer, Structural: Not on file    Marital Status: Never married  Intimate Partner Violence: Not on file     Review of Systems: General: negative for chills, fever, night sweats or weight changes.   Cardiovascular: negative for chest pain, dyspnea on exertion, edema, orthopnea, palpitations, paroxysmal nocturnal dyspnea or shortness of breath Dermatological: negative for rash Respiratory: negative for cough or wheezing Urologic: negative for hematuria Abdominal: negative for nausea, vomiting, diarrhea, bright red blood per rectum, melena, or hematemesis Neurologic: negative for visual changes, syncope, or dizziness All other systems reviewed and are otherwise negative except as noted above.    Blood pressure (!) 154/74, pulse 73, height 5' 2 (1.575 m), weight 180 lb (81.6 kg), last menstrual period 04/30/2012, SpO2 97%.  General appearance: alert and no distress Neck: no adenopathy, no carotid bruit, no JVD, supple, symmetrical, trachea midline, and thyroid  not  enlarged, symmetric, no tenderness/mass/nodules Lungs: clear to auscultation bilaterally Heart: regular rate and rhythm, S1, S2 normal, no murmur, click, rub or gallop Extremities: extremities normal, atraumatic, no cyanosis or edema Pulses: 2+ and symmetric Skin: Skin color, texture, turgor normal. No rashes or lesions Neurologic: Grossly normal  EKG EKG Interpretation Date/Time:  Monday February 12 2024 09:46:40 EST Ventricular Rate:  73 PR Interval:  170 QRS Duration:  70 QT Interval:  396 QTC Calculation: 436 R Axis:   -38  Text Interpretation: Normal sinus rhythm Left axis deviation When compared with ECG of 03-Jul-2023 16:09, Minimal criteria for Anterior infarct are no longer Present No significant change was found Confirmed by Court Carrier [phone]) on 02/12/2024 9:58:49 AM    ASSESSMENT AND PLAN:   Essential hypertension History of essential hypertension blood pressure measured today at 154/74.  She currently is not taking any antihypertensive medications.  Hyperlipidemia History of hyperlipidemia not on statin therapy with lipid profile performed 05/30/2023 revealing total cholesterol 208, LDL 117 and HDL  of 81, near goal for primary prevention with given acute coronary calcium  score of 0.  Palpitations Patient complains of intermittent palpitations.  She has worn a monitor in the past that showed sinus tachycardia with frequent PVCs.  She has alcohol 4.diet is trying to decrease caffeine intake.  She says the palpitations have not changed in frequency or severity and do occur randomly.     Carrier DOROTHA Court MD FACP,FACC,FAHA, [facility] 02/12/2024 10:07 AM

## 2024-02-12 NOTE — Assessment & Plan Note (Signed)
 History of essential hypertension blood pressure measured today at 154/74.  She currently is not taking any antihypertensive medications.

## 2024-02-12 NOTE — Patient Instructions (Signed)
 Medication Instructions:  Your physician recommends that you continue on your current medications as directed. Please refer to the Current Medication list given to you today.  *If you need a refill on your cardiac medications before your next appointment, please call your pharmacy*  Follow-Up: At [facility], you and your health needs are our priority.  As part of our continuing mission to provide you with exceptional heart care, our providers are all part of one team.  This team includes your primary Cardiologist (physician) and Advanced Practice Providers or APPs (Physician Assistants and Nurse Practitioners) who all work together to provide you with the care you need, when you need it.  Your next appointment:   12 month(s)  Provider:   Lauro Portal, MD    We recommend signing up for the patient portal called "MyChart".  Sign up information is provided on this After Visit Summary.  MyChart is used to connect with patients for Virtual Visits (Telemedicine).  Patients are able to view lab/test results, encounter notes, upcoming appointments, etc.  Non-urgent messages can be sent to your provider as well.   To learn more about what you can do with MyChart, go to [email or web address].   Other Instructions Heart-Healthy Eating Plan Eating a healthy diet is important for the health of your heart. A heart-healthy eating plan includes: Eating less unhealthy fats. Eating more healthy fats. Eating less salt in your food. Salt is also called sodium. Making other changes in your diet. Cooking Avoid frying your food. Try to bake, boil, grill, or broil it instead. You can also reduce fat by: Removing the skin from poultry. Removing all visible fats from meats. Steaming vegetables in water or broth. Meal planning  At meals, divide your plate into four equal parts: Fill one-half of your plate with vegetables and green salads. Fill one-fourth of your plate with whole grains. Fill  one-fourth of your plate with lean protein foods. Eat 2-4 cups of vegetables per day. One cup of vegetables is: 1 cup (91 g) broccoli or cauliflower florets. 2 medium carrots. 1 large bell pepper. 1 large sweet potato. 1 large tomato. 1 medium white potato. 2 cups (150 g) raw leafy greens. Eat 1-2 cups of fruit per day. One cup of fruit is: 1 small apple 1 large banana 1 cup (237 g) mixed fruit, 1 large orange,  cup (82 g) dried fruit, 1 cup (240 mL) 100% fruit juice. Eat more foods that have soluble fiber. These are apples, broccoli, carrots, beans, peas, and barley. Try to get 20-30 g of fiber per day. Eat 4-5 servings of nuts, legumes, and seeds per week: 1 serving of dried beans or legumes equals  cup (90 g) cooked. 1 serving of nuts is  oz (12 almonds, 24 pistachios, or 7 walnut halves). 1 serving of seeds equals  oz (8 g). General information Eat more home-cooked food. Eat less restaurant, buffet, and fast food. Limit or avoid alcohol. Limit foods that are high in starch and sugar. Avoid fried foods. Lose weight if you are overweight. Keep track of how much salt (sodium) you eat. This is important if you have high blood pressure. Ask your doctor to tell you more about this. Try to add vegetarian meals each week. Fats Choose healthy fats. These include olive oil and canola oil, flaxseeds, walnuts, almonds, and seeds. Eat more omega-3 fats. These include salmon, mackerel, sardines, tuna, flaxseed oil, and ground flaxseeds. Try to eat fish at least 2 times each week. Check food  labels. Avoid foods with trans fats or high amounts of saturated fat. Limit saturated fats. These are often found in animal products, such as meats, butter, and cream. These are also found in plant foods, such as palm oil, palm kernel oil, and coconut oil. Avoid foods with partially hydrogenated oils in them. These have trans fats. Examples are stick margarine, some tub margarines, cookies,  crackers, and other baked goods. What foods should I eat? Fruits All fresh, canned (in natural juice), or frozen fruits. Vegetables Fresh or frozen vegetables (raw, steamed, roasted, or grilled). Green salads. Grains Most grains. Choose whole wheat and whole grains most of the time. Rice and pasta, including brown rice and pastas made with whole wheat. Meats and other proteins Lean, well-trimmed beef, veal, pork, and lamb. Chicken and [place] without skin. All fish and shellfish. Wild duck, rabbit, pheasant, and venison. Egg whites or low-cholesterol egg substitutes. Dried beans, peas, lentils, and tofu. Seeds and most nuts. Dairy Low-fat or nonfat cheeses, including ricotta and mozzarella. Skim or 1% milk that is liquid, powdered, or evaporated. Buttermilk that is made with low-fat milk. Nonfat or low-fat yogurt. Fats and oils Non-hydrogenated (trans-free) margarines. Vegetable oils, including soybean, sesame, sunflower, olive, peanut, safflower, corn, canola, and cottonseed. Salad dressings or mayonnaise made with a vegetable oil. Beverages Mineral water. Coffee and tea. Diet carbonated beverages. Sweets and desserts Sherbet, gelatin, and fruit ice. Small amounts of dark chocolate. Limit all sweets and desserts. Seasonings and condiments All seasonings and condiments. The items listed above may not be a complete list of foods and drinks you can eat. Contact a dietitian for more options. What foods should I avoid? Fruits Canned fruit in heavy syrup. Fruit in cream or butter sauce. Fried fruit. Limit coconut. Vegetables Vegetables cooked in cheese, cream, or butter sauce. Fried vegetables. Grains Breads that are made with saturated or trans fats, oils, or whole milk. Croissants. Sweet rolls. Donuts. High-fat crackers, such as cheese crackers. Meats and other proteins Fatty meats, such as hot dogs, ribs, sausage, bacon, rib-eye roast or steak. High-fat deli meats, such as salami and  bologna. Caviar. Domestic duck and goose. Organ meats, such as liver. Dairy Cream, sour cream, cream cheese, and creamed cottage cheese. Whole-milk cheeses. Whole or 2% milk that is liquid, evaporated, or condensed. Whole buttermilk. Cream sauce or high-fat cheese sauce. Yogurt that is made from whole milk. Fats and oils Meat fat, or shortening. Cocoa butter, hydrogenated oils, palm oil, coconut oil, palm kernel oil. Solid fats and shortenings, including bacon fat, salt pork, lard, and butter. Nondairy cream substitutes. Salad dressings with cheese or sour cream. Beverages Regular sodas and juice drinks with added sugar. Sweets and desserts Frosting. Pudding. Cookies. Cakes. Pies. Milk chocolate or white chocolate. Buttered syrups. Full-fat ice cream or ice cream drinks. The items listed above may not be a complete list of foods and drinks to avoid. Contact a dietitian for more information. Summary Heart-healthy meal planning includes eating less unhealthy fats, eating more healthy fats, and making other changes in your diet. Eat a balanced diet. This includes fruits and vegetables, low-fat or nonfat dairy, lean protein, nuts and legumes, whole grains, and heart-healthy oils and fats. This information is not intended to replace advice given to you by your health care provider. Make sure you discuss any questions you have with your health care provider. Document Revised: 03/29/2021 Document Reviewed: 03/29/2021 Elsevier Patient Education  2024 [employer].

## 2024-02-12 NOTE — Assessment & Plan Note (Signed)
 Patient complains of intermittent palpitations.  She has worn a monitor in the past that showed sinus tachycardia with frequent PVCs.  She has alcohol 4.diet is trying to decrease caffeine intake.  She says the palpitations have not changed in frequency or severity and do occur randomly.

## 2024-02-12 NOTE — Assessment & Plan Note (Signed)
" >>  ASSESSMENT AND PLAN FOR ESSENTIAL HYPERTENSION WRITTEN ON 02/12/2024 10:05 AM BY Remon Quinto J, MD  History of essential hypertension blood pressure measured today at 154/74.  She currently is not taking any antihypertensive medications. "

## 2024-02-19 ENCOUNTER — Ambulatory Visit: Admitting: Obstetrics and Gynecology

## 2024-02-19 ENCOUNTER — Encounter: Payer: Self-pay | Admitting: Obstetrics and Gynecology

## 2024-02-19 VITALS — BP 124/78 | HR 81 | Temp 98.7°F | Wt 181.0 lb

## 2024-02-19 DIAGNOSIS — M810 Age-related osteoporosis without current pathological fracture: Secondary | ICD-10-CM | POA: Insufficient documentation

## 2024-02-19 DIAGNOSIS — M816 Localized osteoporosis [Lequesne]: Secondary | ICD-10-CM | POA: Diagnosis not present

## 2024-02-19 NOTE — Progress Notes (Signed)
 63 y.o. G19P1001 female with severe osteoporosis here for result discussion. Single.  Patient's last menstrual period was 04/30/2012.   She has a history of osteopenia initially diagnosed on DEXA in 2021. She also has a history of vitamin D  deficiency, currently well-managed with 2000 units daily of vitamin D3. She is not currently very active but does have  rebounder equipment at home. She denies a family history of fracture. She was approximately 63 years old at the age of menopause.  12/12/2023 DEXA showed significant osteoporosis at her spine (T-score -3.0) and osteoporosis at her right femur (T-score -2.5).  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   Past Surgical History:  Procedure Laterality Date   BREAST CYST ASPIRATION Left    COLONOSCOPY  2014   ECTOPIC PREGNANCY SURGERY  1990   IR FLUORO GUIDE CV LINE RIGHT  03/05/2019   IR FLUORO GUIDE CV LINE RIGHT  03/15/2019   IR FLUORO GUIDE CV LINE RIGHT  05/17/2019   IR FLUORO GUIDE CV LINE RIGHT  05/23/2019   IR REMOVAL TUN CV CATH W/O FL  04/12/2019   IR REMOVAL TUN CV CATH W/O FL  08/06/2019   IR US  GUIDE VASC ACCESS RIGHT  03/05/2019   IR US  GUIDE VASC ACCESS RIGHT  03/15/2019   IR US  GUIDE VASC ACCESS RIGHT  05/17/2019   TONSILLECTOMY  1980   UPPER GI ENDOSCOPY  08/2017    [facility]   Medications Ordered Prior to Encounter[1] Allergies[2]    PE Today's Vitals   02/19/24 1601  BP: 124/78  Pulse: 81  Temp: 98.7 F (37.1 C)  TempSrc: Oral  SpO2: 98%  Weight: 181 lb (82.1 kg)   Body mass index is 33.11 kg/m.  Physical Exam Vitals reviewed.  Constitutional:      General: She is not in acute distress.    Appearance: Normal appearance.  HENT:     Head: Normocephalic and atraumatic.     Nose: Nose normal.  Eyes:     Extraocular Movements: Extraocular movements intact.     Conjunctiva/sclera: Conjunctivae normal.  Pulmonary:     Effort: Pulmonary effort is normal.  Musculoskeletal:        General: Normal range of motion.     Cervical back: Normal range of motion.  Neurological:     General: No focal deficit present.     Mental Status: She is alert.  Psychiatric:        Mood and Affect: Mood normal.        Behavior: Behavior normal.      Assessment and Plan:  Localized osteoporosis without current pathological fracture Assessment & Plan: Bone density report discussed. Hx of osteopenia, now with severe osteoporosis. Risk factors assessed: Menopause and diabetes Counseled regarding osteoporosis, risk factors, treatment options. Discussed importance of weight bearing exercise, daily calcium  1200 mg daily and 1000IU vit D, and fall risk reduction. Will complete work-up for secondary osteoporosis. Endocrinology/osteoporosis referral is indicated at this time due to T-score <-3.0 for treatment Patient does understand that osteoporosis is a silent disease until fracture occurs and understands that there is significant morbidity associated with osteoporotic fracture.    Orders: -     Phosphorus -     Magnesium  -     PTH, intact and calcium  -     TSH -     Amb Referral to Osteoporosis Management     Vera LULLA Pa, MD     [1]  Current Outpatient Medications on File Prior to Visit  Medication Sig Dispense Refill    Alum Hydroxide-Mag Carbonate (GAVISCON EXTRA STRENGTH PO) Take 1 tablet by mouth 4 (four) times daily as needed.     B-COMPLEX-C PO Take 1 capsule by mouth every other day.     benzoyl peroxide  5 % external liquid Apply topically 2 (two) times daily. 142 g 12   Blood Glucose Monitoring Suppl DEVI Use to check blood glucose as instructed. May substitute to any manufacturer covered by patient's insurance. OneTouch Ultra = current (replacement needed). 1 each 0   cholecalciferol (VITAMIN D3) 25 MCG (1000 UT) tablet Take 1,000 Units by mouth daily.     Dulaglutide  (TRULICITY ) 0.75 MG/0.5ML SOAJ Inject 0.75 mg into the skin once a week. 12 mL 1   folic acid  (FOLVITE ) 1 MG tablet Take 2 tablets by mouth once daily 60 tablet 0   Glucose Blood (BLOOD GLUCOSE TEST STRIPS 333) STRP Use to check blood sugar up to 3 times daily (or frequency that is covered by insurance). Please dispense brand consistent with glucometer dispensed last month. E11.65. 300 strip 4   Lancets MISC Use to check blood sugar up to 3 times daily (or frequency that is covered by insurance). Please dispense brand consistent with glucometer dispensed last month. E11.65. 300 each 4   Magnesium  Gluconate 550 MG TABS Take 300 mg by mouth.     Methylcobalamin 5000 MCG SUBL Place 500 mcg under the tongue daily.     Milk Thistle 1000 MG CAPS Take 1 capsule by mouth daily.     mometasone  (ELOCON ) 0.1 % cream APPLY 1 CREAM TOPICALLY TO AFFECTED AREA ONCE DAILY 45 g 0   Multiple Vitamin (MULTIVITAMIN PO) Take 1 tablet by mouth every other day.     Coenzyme Q10 (COQ10 PO) Take by mouth. (Patient not taking: Reported on 02/12/2024)     No current facility-administered medications on file prior to visit.  [2]  Allergies Allergen Reactions   Metformin And Related Itching    Heart palpitation   Sulfa Antibiotics Itching and Other (See Comments)

## 2024-02-19 NOTE — Assessment & Plan Note (Addendum)
 Bone density report discussed. Hx of osteopenia, now with severe osteoporosis. Risk factors assessed: Menopause and diabetes Counseled regarding osteoporosis, risk factors, treatment options. Discussed importance of weight bearing exercise, daily calcium  1200 mg daily and 1000IU vit D, and fall risk reduction. Will complete work-up for secondary osteoporosis. Endocrinology/osteoporosis referral is indicated at this time due to T-score <-3.0 for treatment Patient does understand that osteoporosis is a silent disease until fracture occurs and understands that there is significant morbidity associated with osteoporotic fracture.

## 2024-02-20 LAB — PHOSPHORUS: Phosphorus: 2.7 mg/dL (ref 2.5–4.5)

## 2024-02-20 LAB — TSH: TSH: 1.31 m[IU]/L (ref 0.40–4.50)

## 2024-02-20 LAB — MAGNESIUM: Magnesium: 2.1 mg/dL (ref 1.5–2.5)

## 2024-02-20 LAB — PTH, INTACT AND CALCIUM
Calcium: 10.7 mg/dL — ABNORMAL HIGH (ref 8.6–10.4)
PTH: 47 pg/mL (ref 16–77)

## 2024-02-21 ENCOUNTER — Ambulatory Visit: Payer: Self-pay | Admitting: Obstetrics and Gynecology

## 2024-03-04 ENCOUNTER — Ambulatory Visit: Admitting: Physician Assistant

## 2024-03-04 ENCOUNTER — Encounter: Payer: Self-pay | Admitting: Physician Assistant

## 2024-03-04 DIAGNOSIS — M810 Age-related osteoporosis without current pathological fracture: Secondary | ICD-10-CM | POA: Diagnosis not present

## 2024-03-04 NOTE — Progress Notes (Signed)
 "  Office Visit Note   Patient: Vicki [name]           Date of Birth: [birth year]           MRN: [id number] Visit Date: 03/04/2024              Requested by: Dallie Vera GAILS, MD [address] [place],  [place] [address] PCP: Patient, No Pcp Per   Assessment & Plan: Visit Diagnoses:  1. Age-related osteoporosis without current pathological fracture     Plan: Patient is a pleasant 62 year old woman who is referred for evaluation of osteoporosis.  She does not currently take any medications for osteoporosis.  She does not have any history of a fracture.  No history of heart disease or cancer.  She has no history of kidney disease no history of ulcers or gastric bypass surgery.  She does not have reflux no history of epilepsy she went through menopause at 51 did not do hormone replacement therapy.  She has been taking D3 and multivitamins on and off.  She is not a smoker she drinks rarely.  She walks on and off about 1 time a week.  Her mom does have a history of a fragility fracture she has had no major dental work.  Concerns are with her most recent bone density scores were significantly deteriorated to a -3 in her lumbar spine and a -2.8 in her hip.  Currently her FRAX score is a 12% risk for major osteoporotic fracture and a 1.7% risk for a hip fracture.  She may benefit given these numbers on her T-scores from doing an anabolic.  Her previous bone density scan was done at a different facility so hard to measure actually how much is deteriorated.  She understands she is at higher risk.  We talked about calcium  and vitamin D  supplementation and she is going to track her calcium  to see where she is.  We will draw vitamin D  today.  45 minutes was spent talking with her and reviewing her chart.  At this time she declines treatment but would like to work on diet and exercise.  Should have another bone density scan at 2 years from her previous.  If she changes her mind she is more than  welcome to contact me  Follow-Up Instructions: No follow-ups on file.   Orders:  No orders of the defined types were placed in this encounter.  No orders of the defined types were placed in this encounter.     Procedures: No procedures performed   Clinical Data: No additional findings.   Subjective:   HPI patient is a pleasant 63 year old woman who is referred by Dr. Dallie for evaluation of osteoporosis  Review of Systems  All other systems reviewed and are negative.    Objective: Vital Signs: LMP 04/30/2012 Comment: not sexually active  Physical Exam Constitutional:      Appearance: Normal appearance.  Pulmonary:     Effort: Pulmonary effort is normal.  Neurological:     General: No focal deficit present.     Mental Status: She is alert and oriented to person, place, and time.  Psychiatric:        Mood and Affect: Mood normal.        Behavior: Behavior normal.       Specialty Comments:  No specialty comments available.  Imaging: No results found.   PMFS History: Patient Active Problem List   Diagnosis Date Noted   Age-related  osteoporosis without current pathological fracture 02/19/2024   Well woman exam with routine gynecological exam 09/13/2023   Osteopenia of multiple sites 09/13/2023   Urinary incontinence 09/13/2023   Hypomagnesemia 06/04/2023   Lower respiratory infection 04/26/2023   Anxiety about health 01/31/2023   Overactive bladder 12/14/2022   UTI symptoms 10/10/2022   Morbid obesity (HCC) 10/10/2022   Hypertension associated with diabetes (HCC) 10/10/2022   Hyperlipidemia associated with type 2 diabetes mellitus (HCC) 10/10/2022   Type 2 diabetes mellitus with complications (HCC) 10/10/2022   Colon cancer screening 10/10/2022   Peripheral neuropathy 06/23/2022   Leukopenia 03/23/2022   Nonalcoholic steatohepatitis (NASH) 03/23/2022   Thrombocytopenia 03/23/2022   Hidradenitis suppurativa 03/09/2022   Epigastric pain 12/22/2021    Elevated alkaline phosphatase level 12/21/2021   B12 deficiency 03/18/2021   Vitamin D  deficiency 03/18/2021   Hypomagnesuria 03/18/2021   Recurrent boils 10/06/2020   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   Hyperlipidemia 05/15/2020   Essential hypertension 02/18/2020   Vitamin B12 deficiency 02/18/2020   Normocytic anemia 08/15/2019   Cardiac murmur 05/08/2019   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   Insomnia 02/26/2019   Acquired pancytopenia (HCC) 11/01/2018   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/25/2018   Classical migraine with intractable migraine 04/09/2018   Spinal stenosis of lumbar region 01/17/2018   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   Palpitations 04/15/2015   Controlled type 2 diabetes mellitus without complication (HCC) 03/31/2014   Elevated liver enzymes 03/31/2014   Tinea pedis 07/31/2013   Plantar fascial fibromatosis 08/31/2012   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    Family History  Problem Relation Age of Onset   Hypertension Mother    Diabetes Mother    Heart attack Mother 81   Asthma Mother        Childhood   Arthritis Mother    Heart disease Mother    Hyperlipidemia Mother    Hypertension Sister    Diabetes Sister    Asthma Sister 40       asthma attack cause of death   Diabetes Brother    Hypertension Brother    Diabetes Brother    Hypertension Brother    Hypertension Maternal Grandmother    Heart attack Maternal Grandfather 46   Colon cancer Neg Hx    Thyroid  cancer Neg Hx     Past  Surgical History:  Procedure Laterality Date   BREAST CYST ASPIRATION Left    COLONOSCOPY  2014   ECTOPIC PREGNANCY SURGERY  1990   IR FLUORO GUIDE CV LINE RIGHT  03/05/2019   IR FLUORO GUIDE CV LINE RIGHT  03/15/2019   IR FLUORO GUIDE CV LINE RIGHT  05/17/2019   IR FLUORO GUIDE CV LINE RIGHT  05/23/2019   IR REMOVAL TUN CV CATH W/O FL  04/12/2019   IR REMOVAL TUN CV CATH W/O FL  08/06/2019   IR US  GUIDE VASC ACCESS RIGHT  03/05/2019   IR US  GUIDE VASC ACCESS RIGHT  03/15/2019  IR US  GUIDE VASC ACCESS RIGHT  05/17/2019   TONSILLECTOMY  1980   UPPER GI ENDOSCOPY  08/2017   [facility]   Social History   Occupational History    Employer: [employer]  Tobacco Use   Smoking status: Never   Smokeless tobacco: Never  Vaping Use   Vaping status: Never Used  Substance and Sexual Activity   Alcohol use: Yes    Comment: 2 a month   Drug use: No   Sexual activity: Not Currently    Partners: Male    Birth control/protection: Post-menopausal        "

## 2024-03-05 ENCOUNTER — Other Ambulatory Visit: Payer: Self-pay | Admitting: Hematology & Oncology

## 2024-03-05 DIAGNOSIS — M3119 Other thrombotic microangiopathy: Secondary | ICD-10-CM

## 2024-03-05 LAB — VITAMIN D 25 HYDROXY (VIT D DEFICIENCY, FRACTURES): Vit D, 25-Hydroxy: 38 ng/mL (ref 30–100)

## 2024-03-08 ENCOUNTER — Ambulatory Visit: Payer: Self-pay

## 2024-03-08 NOTE — Telephone Encounter (Signed)
 FYI Only or Action Required?: Action required by provider: request for appointment.  Patient was last seen in primary care on 08/21/2023 by Hope Merle, MD.  Called Nurse Triage reporting Palpitations.  Symptoms began about a month ago.  Interventions attempted: Nothing.  Symptoms are: unchanged. Having palpitations that come and go. Lasts a few seconds. No other symptoms.  Triage Disposition: See PCP When Office is Open (Within 3 Days)  Patient/caregiver understands and will follow disposition?: Yes     Copied from CRM [id number]. Topic: Clinical - Red Word Triage >> Mar 08, 2024 11:58 AM Chasity T wrote: [facility] that prompted transfer to Nurse Triage: having bad heart flutters and blood calcium  levels are high, wants to check her blood A1C but TOC is in feb. Answer Assessment - Initial Assessment Questions 1. DESCRIPTION: Please describe your heart rate or heartbeat that you are having (e.g., fast/slow, regular/irregular, skipped or extra beats, palpitations)     flutters 2. ONSET: When did it start? (e.g., minutes, hours, days)      1 month 3. DURATION: How long does it last (e.g., seconds, minutes, hours)     seconds 4. PATTERN Does it come and go, or has it been constant since it started?  Does it get worse with exertion?   Are you feeling it now?     Comes and goes 5. TAP: Using your hand, can you tap out what you are feeling on a chair or table in front of you, so that I can hear? Note: Not all patients can do this.       no 6. HEART RATE: Can you tell me your heart rate? How many beats in 15 seconds?  Note: Not all patients can do this.       no 7. RECURRENT SYMPTOM: Have you ever had this before? If Yes, ask: When was the last time? and What happened that time?      yes 8. CAUSE: What do you think is causing the palpitations?     unsure 9. CARDIAC HISTORY: Do you have any history of heart disease? (e.g., heart attack, angina, bypass surgery,  angioplasty, arrhythmia)      yes 10. OTHER SYMPTOMS: Do you have any other symptoms? (e.g., dizziness, chest pain, sweating, difficulty breathing)       no 11. PREGNANCY: Is there any chance you are pregnant? When was your last menstrual period?       no  Protocols used: Heart Rate and Heartbeat Questions-A-AH  Reason for Disposition  [1] Palpitations AND [2] no improvement after using Care Advice  Answer Assessment - Initial Assessment Questions 1. DESCRIPTION: Please describe your heart rate or heartbeat that you are having (e.g., fast/slow, regular/irregular, skipped or extra beats, palpitations)     flutters 2. ONSET: When did it start? (e.g., minutes, hours, days)      1 month 3. DURATION: How long does it last (e.g., seconds, minutes, hours)     seconds 4. PATTERN Does it come and go, or has it been constant since it started?  Does it get worse with exertion?   Are you feeling it now?     Comes and goes 5. TAP: Using your hand, can you tap out what you are feeling on a chair or table in front of you, so that I can hear? Note: Not all patients can do this.       no 6. HEART RATE: Can you tell me your heart rate? How many beats in 15  seconds?  Note: Not all patients can do this.       no 7. RECURRENT SYMPTOM: Have you ever had this before? If Yes, ask: When was the last time? and What happened that time?      yes 8. CAUSE: What do you think is causing the palpitations?     unsure 9. CARDIAC HISTORY: Do you have any history of heart disease? (e.g., heart attack, angina, bypass surgery, angioplasty, arrhythmia)      yes 10. OTHER SYMPTOMS: Do you have any other symptoms? (e.g., dizziness, chest pain, sweating, difficulty breathing)       no 11. PREGNANCY: Is there any chance you are pregnant? When was your last menstrual period?       no  Protocols used: Heart Rate and Heartbeat Questions-A-AH

## 2024-03-14 ENCOUNTER — Ambulatory Visit

## 2024-03-14 DIAGNOSIS — M810 Age-related osteoporosis without current pathological fracture: Secondary | ICD-10-CM

## 2024-03-14 DIAGNOSIS — L732 Hidradenitis suppurativa: Secondary | ICD-10-CM | POA: Diagnosis not present

## 2024-03-14 DIAGNOSIS — N3941 Urge incontinence: Secondary | ICD-10-CM

## 2024-03-14 DIAGNOSIS — R600 Localized edema: Secondary | ICD-10-CM | POA: Insufficient documentation

## 2024-03-14 DIAGNOSIS — E785 Hyperlipidemia, unspecified: Secondary | ICD-10-CM

## 2024-03-14 DIAGNOSIS — E118 Type 2 diabetes mellitus with unspecified complications: Secondary | ICD-10-CM

## 2024-03-14 DIAGNOSIS — I152 Hypertension secondary to endocrine disorders: Secondary | ICD-10-CM | POA: Diagnosis not present

## 2024-03-14 DIAGNOSIS — K7581 Nonalcoholic steatohepatitis (NASH): Secondary | ICD-10-CM | POA: Diagnosis not present

## 2024-03-14 DIAGNOSIS — E1159 Type 2 diabetes mellitus with other circulatory complications: Secondary | ICD-10-CM | POA: Diagnosis not present

## 2024-03-14 DIAGNOSIS — E1169 Type 2 diabetes mellitus with other specified complication: Secondary | ICD-10-CM

## 2024-03-14 DIAGNOSIS — N3281 Overactive bladder: Secondary | ICD-10-CM

## 2024-03-14 DIAGNOSIS — R002 Palpitations: Secondary | ICD-10-CM

## 2024-03-14 DIAGNOSIS — M3119 Other thrombotic microangiopathy: Secondary | ICD-10-CM

## 2024-03-14 DIAGNOSIS — F5101 Primary insomnia: Secondary | ICD-10-CM

## 2024-03-14 LAB — COMPREHENSIVE METABOLIC PANEL WITH GFR
ALT: 13 U/L (ref 3–35)
AST: 19 U/L (ref 5–37)
Albumin: 4.3 g/dL (ref 3.5–5.2)
Alkaline Phosphatase: 94 U/L (ref 39–117)
BUN: 9 mg/dL (ref 6–23)
CO2: 29 meq/L (ref 19–32)
Calcium: 9.7 mg/dL (ref 8.4–10.5)
Chloride: 105 meq/L (ref 96–112)
Creatinine, Ser: 0.68 mg/dL (ref 0.40–1.20)
GFR: 91.98 mL/min
Glucose, Bld: 132 mg/dL — ABNORMAL HIGH (ref 70–99)
Potassium: 3.9 meq/L (ref 3.5–5.1)
Sodium: 140 meq/L (ref 135–145)
Total Bilirubin: 0.8 mg/dL (ref 0.2–1.2)
Total Protein: 7.2 g/dL (ref 6.0–8.3)

## 2024-03-14 LAB — CBC WITH DIFFERENTIAL/PLATELET
Basophils Absolute: 0 K/uL (ref 0.0–0.1)
Basophils Relative: 0.6 % (ref 0.0–3.0)
Eosinophils Absolute: 0.1 K/uL (ref 0.0–0.7)
Eosinophils Relative: 2.6 % (ref 0.0–5.0)
HCT: 40 % (ref 36.0–46.0)
Hemoglobin: 13.6 g/dL (ref 12.0–15.0)
Lymphocytes Relative: 26.2 % (ref 12.0–46.0)
Lymphs Abs: 1 K/uL (ref 0.7–4.0)
MCHC: 34 g/dL (ref 30.0–36.0)
MCV: 89.1 fl (ref 78.0–100.0)
Monocytes Absolute: 0.3 K/uL (ref 0.1–1.0)
Monocytes Relative: 6.7 % (ref 3.0–12.0)
Neutro Abs: 2.5 K/uL (ref 1.4–7.7)
Neutrophils Relative %: 63.9 % (ref 43.0–77.0)
Platelets: 140 K/uL — ABNORMAL LOW (ref 150.0–400.0)
RBC: 4.49 Mil/uL (ref 3.87–5.11)
RDW: 12.8 % (ref 11.5–15.5)
WBC: 3.9 K/uL — ABNORMAL LOW (ref 4.0–10.5)

## 2024-03-14 LAB — URINALYSIS, ROUTINE W REFLEX MICROSCOPIC
Bilirubin Urine: NEGATIVE
Hgb urine dipstick: NEGATIVE
Ketones, ur: NEGATIVE
Leukocytes,Ua: NEGATIVE
Nitrite: NEGATIVE
RBC / HPF: NONE SEEN
Specific Gravity, Urine: 1.015 (ref 1.000–1.030)
Total Protein, Urine: NEGATIVE
Urine Glucose: NEGATIVE
Urobilinogen, UA: 0.2 (ref 0.0–1.0)
pH: 7 (ref 5.0–8.0)

## 2024-03-14 LAB — MICROALBUMIN / CREATININE URINE RATIO
Creatinine,U: 148.3 mg/dL
Microalb Creat Ratio: UNDETERMINED mg/g (ref 0.0–30.0)
Microalb, Ur: 0.7 mg/dL (ref 0.7–1.9)

## 2024-03-14 LAB — TSH: TSH: 1.65 u[IU]/mL (ref 0.35–5.50)

## 2024-03-14 LAB — VITAMIN D 25 HYDROXY (VIT D DEFICIENCY, FRACTURES): VITD: 50.24 ng/mL (ref 30.00–100.00)

## 2024-03-14 MED ORDER — TRULICITY 0.75 MG/0.5ML ~~LOC~~ SOAJ: 0.7500 mg | SUBCUTANEOUS | 1 refills | Status: AC

## 2024-03-14 MED ORDER — BENZOYL PEROXIDE WASH 5 % EX LIQD: Freq: Two times a day (BID) | CUTANEOUS | 12 refills | Status: AC

## 2024-03-14 NOTE — Progress Notes (Signed)
 "  Established Patient Office Visit   Subjective  Patient ID: Vicki [name], female    DOB: 07/02/1960  Age: 64 y.o. MRN: [id number]  Chief Complaint  Patient presents with   Osteoporosis   Abnormal Lab   Discussed the use of AI scribe software for clinical note transcription with the patient, who gave verbal consent to proceed.  History of Present Illness Vicki [name] is a 64 year old female who presents for evaluation of elevated calcium  levels.  Palpitations: She has a known history of palpitations and has seen cardiology in the past for this, last visit with Dr. Court was on 02/12/24. Was recorded to have less palpitations with reduced caffeine intake. 1 year follow up was recommended.  Since then, she was diagnosed with osteoporosis by her gynecologist following a DEXA scan. Her calcium  levels have been mildly elevated, and she has an upcoming appointment with an endocrinologist on April 14th to further investigate the high calcium  levels. She has not noted change in palpitations, no chest pain.  03/04/24 labs showed normal vitamin D , normal TSH, normal parathyroid  hormone level with mildly elevate calcium  at 10.7 mg/dl,   Osteoporosis: She sees ortho for this. Was recommended anabolic treatment for this. She declined treatment at her last visit on 03/04/24 and has repeat DEXA ordered for 2 years from last. She is not currently on any specific treatment for osteoporosis.  TTP: Known history with close follow up with Dr. Maude Crease (heme/onc). s/p treatment with plasmapheresis, Rituxan  and steroids - off treatment since 2021).  MASH with biopsy proven liver cirrhosis (in July 2018): She follows up with [facility] for this, her last visit with GI PA Selinda  was on 01/03/24. Lifestyle modifications to minimize disease progression recommended ( lose 10% body weight, avoid alcohol and high fructose beverages, exercise 30 mn/d at least 5 d/week, consider Mediterranean diet +  drinking 2 cups of coffee/d).   DM II: On Trulicity  0.75 mg weekly. A1c has been stable and under good control.    Insomnia: She experiences frequent nighttime awakenings to urinate. She checks her blood sugar at home and reports fluctuations, with readings of 150 mg/dL upon waking. She also reports difficulty sleeping, which she believes may be affecting her blood sugar levels.  HTN: She has a history of hypertension but is not currently on medication. She monitors her blood pressure at home, with recent readings ranging from 130/70 mmHg to 160/80 mmHg.  Urinary symptoms: She reports urinary urgency and occasional leakage, particularly when she feels the need to urinate.     ROS As per HPI    Objective:     BP 130/74 (BP Location: Left Arm, Patient Position: Sitting, Cuff Size: Normal)   Pulse 82   Temp 99.4 F (37.4 C) (Oral)   Ht 5' 2 (1.575 m)   Wt 179 lb 9.6 oz (81.5 kg)   LMP 04/30/2012 Comment: not sexually active  SpO2 98%   BMI 32.85 kg/m      03/14/2024    8:18 AM 10/25/2023    3:21 PM 09/13/2023    1:45 PM  Depression screen PHQ 2/9  Decreased Interest 0 0 0  Down, Depressed, Hopeless 0 0 0  PHQ - 2 Score 0 0 0  Altered sleeping 3    Tired, decreased energy 1    Change in appetite 0    Feeling bad or failure about yourself  0    Trouble concentrating 0    Moving  slowly or fidgety/restless 0    Suicidal thoughts 0    PHQ-9 Score 4    Difficult doing work/chores Not difficult at all        03/14/2024    8:18 AM 08/21/2023   10:04 AM 07/27/2023    1:33 PM 05/29/2023   11:27 AM  GAD 7 : Generalized Anxiety Score  Nervous, Anxious, on Edge 0 1 1 0  Control/stop worrying 0 0 0 0  Worry too much - different things 0 1 0 0  Trouble relaxing 0 0 0 0  Restless 0 0 0 0  Easily annoyed or irritable 0 1 1 1   Afraid - awful might happen 0 0 1 0  Total GAD 7 Score 0 3 3 1   Anxiety Difficulty Not difficult at all Somewhat difficult Not difficult at all Not difficult  at all      03/14/2024    8:18 AM 10/25/2023    3:21 PM 09/13/2023    1:45 PM  Depression screen PHQ 2/9  Decreased Interest 0 0 0  Down, Depressed, Hopeless 0 0 0  PHQ - 2 Score 0 0 0  Altered sleeping 3    Tired, decreased energy 1    Change in appetite 0    Feeling bad or failure about yourself  0    Trouble concentrating 0    Moving slowly or fidgety/restless 0    Suicidal thoughts 0    PHQ-9 Score 4    Difficult doing work/chores Not difficult at all        03/14/2024    8:18 AM 08/21/2023   10:04 AM 07/27/2023    1:33 PM 05/29/2023   11:27 AM  GAD 7 : Generalized Anxiety Score  Nervous, Anxious, on Edge 0 1 1 0  Control/stop worrying 0 0 0 0  Worry too much - different things 0 1 0 0  Trouble relaxing 0 0 0 0  Restless 0 0 0 0  Easily annoyed or irritable 0 1 1 1   Afraid - awful might happen 0 0 1 0  Total GAD 7 Score 0 3 3 1   Anxiety Difficulty Not difficult at all Somewhat difficult Not difficult at all Not difficult at all   SDOH Screenings   Food Insecurity: No Food Insecurity (03/12/2024)  Housing: Unknown (03/12/2024)  Transportation Needs: No Transportation Needs (03/12/2024)  Alcohol Screen: Low Risk (03/12/2024)  Depression (PHQ2-9): Low Risk (03/14/2024)  Financial Resource Strain: Low Risk (03/12/2024)  Physical Activity: Inactive (03/12/2024)  Social Connections: Unknown (03/12/2024)  Stress: No Stress Concern Present (03/12/2024)  Tobacco Use: Low Risk (03/14/2024)     Physical Exam Constitutional:      General: She is not in acute distress.    Appearance: Normal appearance. She is obese.  HENT:     Head: Normocephalic and atraumatic.     Right Ear: Tympanic membrane normal. There is no impacted cerumen.     Left Ear: Tympanic membrane normal. There is no impacted cerumen.     Mouth/Throat:     Mouth: Mucous membranes are moist.  Neck:     Thyroid : No thyroid  mass or thyroid  tenderness.  Cardiovascular:     Rate and Rhythm: Normal rate and regular rhythm.   Pulmonary:     Effort: Pulmonary effort is normal.     Breath sounds: Normal breath sounds.  Abdominal:     General: Bowel sounds are normal.     Palpations: Abdomen is soft.     Tenderness:  There is no abdominal tenderness. There is no guarding.  Musculoskeletal:     Cervical back: Neck supple. No rigidity or tenderness.     Right lower leg: Edema present.     Left lower leg: Edema present.     Comments: B/l lower legs 1+ pitting edema  Lymphadenopathy:     Cervical: No cervical adenopathy.  Skin:    General: Skin is warm.  Neurological:     Mental Status: She is alert and oriented to person, place, and time.  Psychiatric:        Mood and Affect: Mood normal.        Behavior: Behavior normal.        No results found for any visits on 03/14/24.  The 10-year ASCVD risk score (Arnett DK, et al., 2019) is: 18%     Assessment & Plan:   Assessment & Plan Hypercalcemia Recurrent hypercalcemia in previous labs. Check following labs as outlined. Recommend keep appointment with endocrinologist scheduled for 06/2024 as scheduled. Patient does not have typical hypercalcemia related symptoms. Discussed importance of hydration with the patient.  Orders:   Calcium , ionized   TSH   CBC w/Diff   Comp Met (CMET)   Vitamin D  (25 hydroxy)   PTH, intact (no Ca)  Overactive bladder Experiencing urgency, bladder pressure, pain and nocturia. No urologist follow-up. Check UA and referral to urology for further evaluation and management.  Orders:   Ambulatory referral to Urology   Urinalysis, Routine w reflex microscopic  Hidradenitis suppurativa Involving groin area within intermittent flare up. Stable on current treatment with Benzoyl peroxide  5% BID. Continue, refill sent.  Orders:   benzoyl peroxide  5 % external liquid; Apply topically 2 (two) times daily.  Urge incontinence of urine Plan per overactive bladder.  Orders:   Ambulatory referral to Urology   Urinalysis, Routine  w reflex microscopic  Age-related osteoporosis without current pathological fracture Diagnosed with osteoporosis. Advised treatment to reduce fracture risk and discussed vitamin D  supplementation.  Encouraged follow-up with orthopedic specialist.  Encouraged regular exercise. Has upcoming appointment with endocrinologist in 06/2024, also encourage patient to discuss risks, benefits, treatment options for osteoporosis.     DM type 2, controlled, with complication (HCC) Blood glucose fluctuating at home around 150 mg/dl in the morning, J8r has been stable, last A1c 6.5% in October. -Will repeat A1c after January 29th. Discussed the importance of diet, exercise, sleep in managing blood sugar levels. Continue Trulicity  0.75 mg weekly. Consider oral medication in the future for patient convenient. Recommend updating influenza, pneumonia immunization. Patient declined. Check urine microalbumin.  Orders:   Dulaglutide  (TRULICITY ) 0.75 MG/0.5ML SOAJ; Inject 0.75 mg into the skin once a week.   Urine Microalbumin w/creat. ratio  Hyperlipidemia associated with type 2 diabetes mellitus (HCC) Calcium  score 0 in the past so not on lipid lowering medication. Following up with cardiologist. Following lifestyle counseling provided:  Diet: Emphasize whole grains, lean proteins, fruits, and vegetables. Limit processed foods and sugary drinks. Exercise: Aim for 150 minutes of moderate aerobic activity weekly plus strength training twice a week. Weight Loss: Target 5-10% reduction.     Hypertension associated with diabetes (HCC) Blood pressure occasionally >130/80 mmHg. Discussed control importance, especially for liver health. Recommended home blood pressure monitoring and keeping a log. Encouraged adherence to DASH diet. Scheduled follow-up appointment in February to review blood pressure log.    Primary insomnia Sleep hygiene counseling provided to the patient. F/u in 04/2024    Palpitations Stable  and  unchanged from baseline. No chest pain. Continue monitoring and reach out to clinic if change in symptoms, frequency, chest pain. Also encourage patient f/u with cardiologist Dr. Court (last visit on 02/12/24) if symptoms changed.      TTP (thrombotic thrombocytopenic purpura) (HCC) Known history with close follow up with Dr. Maude Crease (heme/onc). s/p treatment with plasmapheresis, Rituxan  and steroids - off treatment since 2021). Continue f/u with oncology as scheduled. This can be contributing to elevation in calcium  as well.     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 Lifestyle modifications to minimize disease progression and follow up with GI as scheduled recommended. Importance of BP, diabetes, cholesterol control discussed as well to reduce progression of MASH.     Lower leg edema B/L lower leg 1+ pitting edema.  Lifestyle Modifications: Advise on leg elevation, weight management, and regular exercise. Compression Therapy: Recommend graduated compression stockings, 20 mmHg pressure socks recommended. Follow up in 04/2024 and consider vascular evaluation if swelling persists.      I personally spent a total of 60 minutes in the care of the patient today including preparing to see the patient, getting/reviewing separately obtained history, performing a medically appropriate exam/evaluation, counseling and educating, placing orders, documenting clinical information in the EHR, independently interpreting results, communicating results, and coordinating care.  Return for DID TOC today. Keep 04/29/24 appointment for chronic follow up .   Luke Shade, MD "

## 2024-03-14 NOTE — Assessment & Plan Note (Signed)
 Calcium  score 0 in the past so not on lipid lowering medication. Following up with cardiologist. Following lifestyle counseling provided:  Diet: Emphasize whole grains, lean proteins, fruits, and vegetables. Limit processed foods and sugary drinks. Exercise: Aim for 150 minutes of moderate aerobic activity weekly plus strength training twice a week. Weight Loss: Target 5-10% reduction.

## 2024-03-14 NOTE — Assessment & Plan Note (Signed)
 Blood glucose fluctuating at home around 150 mg/dl in the morning, Vicki [name] has been stable, last A1c 6.5% in October. -Will repeat A1c after January 29th. Discussed the importance of diet, exercise, sleep in managing blood sugar levels. Continue Trulicity  0.75 mg weekly. Consider oral medication in the future for patient convenient. Recommend updating influenza, pneumonia immunization. Patient declined. Check urine microalbumin.  Orders:   Dulaglutide  (TRULICITY ) 0.75 MG/0.5ML SOAJ; Inject 0.75 mg into the skin once a week.   Urine Microalbumin w/creat. ratio

## 2024-03-14 NOTE — Assessment & Plan Note (Signed)
 Experiencing urgency, bladder pressure, pain and nocturia. No urologist follow-up. Check UA and referral to urology for further evaluation and management.  Orders:   Ambulatory referral to Urology   Urinalysis, Routine w reflex microscopic

## 2024-03-14 NOTE — Assessment & Plan Note (Signed)
 Known history with close follow up with Dr. Maude Crease (heme/onc). s/p treatment with plasmapheresis, Rituxan  and steroids - off treatment since 2021). Continue f/u with oncology as scheduled. This can be contributing to elevation in calcium  as well.

## 2024-03-14 NOTE — Assessment & Plan Note (Addendum)
 Involving groin area within intermittent flare up. Stable on current treatment with Benzoyl peroxide  5% BID. Continue, refill sent.  Orders:   benzoyl peroxide  5 % external liquid; Apply topically 2 (two) times daily.

## 2024-03-14 NOTE — Assessment & Plan Note (Signed)
 Sleep hygiene counseling provided to the patient. F/u in 04/2024

## 2024-03-14 NOTE — Assessment & Plan Note (Addendum)
 Recurrent hypercalcemia in previous labs. Check following labs as outlined. Recommend keep appointment with endocrinologist scheduled for 06/2024 as scheduled. Patient does not have typical hypercalcemia related symptoms. Discussed importance of hydration with the patient.  Orders:   Calcium , ionized   TSH   CBC w/Diff   Comp Met (CMET)   Vitamin D  (25 hydroxy)   PTH, intact (no Ca)

## 2024-03-14 NOTE — Assessment & Plan Note (Signed)
 Blood pressure occasionally >130/80 mmHg. Discussed control importance, especially for liver health. Recommended home blood pressure monitoring and keeping a log. Encouraged adherence to DASH diet. Scheduled follow-up appointment in February to review blood pressure log.

## 2024-03-14 NOTE — Assessment & Plan Note (Signed)
 B/L lower leg 1+ pitting edema.  Lifestyle Modifications: Advise on leg elevation, weight management, and regular exercise. Compression Therapy: Recommend graduated compression stockings, 20 mmHg pressure socks recommended. Follow up in 04/2024 and consider vascular evaluation if swelling persists.

## 2024-03-14 NOTE — Assessment & Plan Note (Signed)
 Stable and unchanged from baseline. No chest pain. Continue monitoring and reach out to clinic if change in symptoms, frequency, chest pain. Also encourage patient f/u with cardiologist Dr. Court (last visit on 02/12/24) if symptoms changed.

## 2024-03-14 NOTE — Assessment & Plan Note (Signed)
 Diagnosed with osteoporosis. Advised treatment to reduce fracture risk and discussed vitamin D  supplementation.  Encouraged follow-up with orthopedic specialist.  Encouraged regular exercise. Has upcoming appointment with endocrinologist in 06/2024, also encourage patient to discuss risks, benefits, treatment options for osteoporosis.

## 2024-03-14 NOTE — Assessment & Plan Note (Addendum)
 Plan per overactive bladder.  Orders:   Ambulatory referral to Urology   Urinalysis, Routine w reflex microscopic

## 2024-03-14 NOTE — Patient Instructions (Addendum)
 Your blood pressure goal is less than 130/80 mmHg. DASH diet can help with blood pressure improvement.   To help with liver health, over all health recommend:  Diet: Emphasize whole grains, lean proteins, fruits, and vegetables. Limit processed foods and sugary drinks. Exercise: Aim for 150 minutes of moderate aerobic activity weekly plus strength training twice a week. Weight Loss: Target 5-10% reduction.   Strongly recommend updating influenza, pneumonia immunization.    Please follow up with ortho if you choose to continue treatment for osteoporosis. You can also discuss osteoporosis treatment when you see endocrinologist in April.    Recommendation Details  Regular bedtime and rise time Having a consistent bedtime and rise time leads to more regular sleep schedules and avoids periods of sleep deprivation or periods of extended wakefulness during the night.  Avoid napping Avoid napping, especially naps lasting longer than 1 hour and naps late in the day.  Limit caffeine Avoid caffeine after lunch. The time between lunch and bedtime represents approximately 2 half-lives for caffeine, and this time window allows for most caffeine to be metabolized before bedtime.  Limit alcohol Recommendations are typically focused on avoiding alcohol near bedtime. Alcohol is initially sedating, but activating as it is metabolized. Alcohol also negatively impacts sleep architecture.  Avoid nicotine Nicotine is a stimulant and should be avoided near bedtime and at night.  Exercise Daytime physical activity is encouraged, in particular, 4 to 6 hours before bedtime, as this may facilitate sleep onset. Rigorous exercise within 2 hours of bedtime is discouraged.  Keep the sleep environment quiet and dark Noise and light exposure during the night can disrupt sleep. White noise or ear plugs are often recommended to reduce disturbing noise. Using blackout shades or an eye mask is commonly recommended to reduce  light. This may also include avoiding exposure to television or technology near bedtime, as this can have an impact on circadian rhythms by shifting sleep timing later.  Bedroom clock Avoid checking the time at night. This includes alarm clocks and other time pieces (eg, watches and smart phones). Checking the time increases cognitive arousal and prolongs wakefulness.  Evening eating Avoid a large meal close to bedtime. Eat a healthy and filling (but not too heavy) meal in the early evening and avoid late-night snacks.   Lower leg swelling:  Lifestyle Modifications: Advise on leg elevation, weight management, and regular exercise. Compression Therapy: Recommend graduated compression stockings. 20 mmHg pressure socks that you can wear during daytime, you can get these at any pharmacy.

## 2024-03-14 NOTE — Assessment & Plan Note (Signed)
 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 Lifestyle modifications to minimize disease progression and follow up with GI as scheduled recommended. Importance of BP, diabetes, cholesterol control discussed as well to reduce progression of MASH.

## 2024-03-18 ENCOUNTER — Ambulatory Visit: Payer: Self-pay

## 2024-03-18 DIAGNOSIS — E215 Disorder of parathyroid gland, unspecified: Secondary | ICD-10-CM

## 2024-03-18 LAB — PARATHYROID HORMONE, INTACT (NO CA)

## 2024-03-18 LAB — CALCIUM, IONIZED: Calcium, Ion: 5.5 mg/dL (ref 4.7–5.5)

## 2024-03-19 ENCOUNTER — Telehealth: Payer: Self-pay

## 2024-03-19 NOTE — Addendum Note (Signed)
 Addended by: ANICE BELT on: 03/19/2024 01:21 PM   Modules accepted: Orders

## 2024-03-19 NOTE — Telephone Encounter (Signed)
 Received an e-fax from [employer] stating the PTH, intact (no Ca) test was not performed and states the specimen not a suitable specimen received. Form was given to the Lab Personnel Marval Apo, as she was the lab personnel to collect the specimen on the patient the day of the appointment 03/14/24.

## 2024-03-20 ENCOUNTER — Other Ambulatory Visit

## 2024-03-21 LAB — SPECIMEN STATUS REPORT

## 2024-03-22 ENCOUNTER — Other Ambulatory Visit

## 2024-03-22 LAB — SPECIMEN STATUS REPORT

## 2024-03-22 LAB — PARATHYROID HORMONE, INTACT (NO CA): PTH: 87 pg/mL — AB (ref 15–65)

## 2024-03-25 DIAGNOSIS — E215 Disorder of parathyroid gland, unspecified: Secondary | ICD-10-CM | POA: Insufficient documentation

## 2024-03-25 NOTE — Telephone Encounter (Signed)
 Copied from CRM [id number]. Topic: Clinical - Lab/Test Results >> Mar 25, 2024  4:10 PM Aisha D wrote: Reason for CRM: Pt was made aware recent lab results and was informed that a referral was placed to the ENT. Pt wants to know why her parathyroid  hormone level came back mildly elevated and would like for Dr.Bair to give her a callback in regards to this concern.

## 2024-03-25 NOTE — Progress Notes (Signed)
 Please let the patient know her parathyroid  hormone level came back mildly elevated.  I recommend evaluation by ENT as this doctor have expertise in parathyroid  conditions and help determine what further assessment and treatment is necessary.  I put a referral, if she does not hear back from ENT within couple of weeks recommend she reaches out to our office.  She should keep her appointment with endocrinologist for further evaluation and management into osteoporosis which is already scheduled for 06/18/2024.  Thank you,  Luke Shade, MD  ---------- For documentation: 1. Parathyroid  disorder (Primary) - Elevated PTH with history of mildly elevated calcium .  Suspect primary hyperparathyroidism. - Ambulatory referral to ENT  Luke Shade, MD

## 2024-03-26 NOTE — Telephone Encounter (Signed)
 Please let the patient know: Hyperparathyroidism happens when one or more of the parathyroid  glands (small glands in the neck) make too much parathyroid  hormone. This hormone helps control calcium  levels in the blood. When too much is made, calcium  levels can become higher than normal.  At this time, the exact cause of her hyperparathyroidism is not completely clear. Because of this, a referral has been placed to an ENT (ear, nose, and throat) specialist for further evaluation. The ENT specialist can help determine the cause and discuss whether any additional testing or treatment is needed.  Please offer her an appointment if she has any questions or concerns, or if she would like to review her lab results in more detail.  Thank you, Luke Shade, MD

## 2024-03-29 ENCOUNTER — Telehealth: Payer: Self-pay

## 2024-03-29 NOTE — Telephone Encounter (Unsigned)
 Copied from CRM #[id number]. Topic: Clinical - Lab/Test Results >> Mar 29, 2024 10:38 AM Ivette P wrote: Reason for CRM: Pt called in due to missed call. Relayed labls.    Please let the patient know her parathyroid  hormone level came back mildly elevated.  I recommend evaluation by ENT as this doctor have expertise in parathyroid  conditions and help determine what further assessment and treatment is necessary.  I put a referral, if she does not hear back from ENT within couple of weeks recommend she reaches out to our office.   She should keep her appointment with endocrinologist for further evaluation and management into osteoporosis which is already scheduled for 06/18/2024.    PT aid she was already aware she saw results on mychart

## 2024-03-29 NOTE — Telephone Encounter (Signed)
 Copied from CRM #[id number]. Topic: Referral - Question >> Mar 27, 2024 10:49 AM Aleatha BROCKS wrote: Reason for CRM: Patient would like for her ENT referral to be with [facility] please give patient a all if need further information

## 2024-04-01 NOTE — Telephone Encounter (Signed)
 Noted.

## 2024-04-04 ENCOUNTER — Telehealth: Payer: Self-pay

## 2024-04-04 ENCOUNTER — Other Ambulatory Visit (HOSPITAL_COMMUNITY): Payer: Self-pay

## 2024-04-04 ENCOUNTER — Encounter: Payer: Self-pay | Admitting: Hematology & Oncology

## 2024-04-04 ENCOUNTER — Telehealth: Payer: Self-pay | Admitting: Pharmacy Technician

## 2024-04-04 DIAGNOSIS — R7989 Other specified abnormal findings of blood chemistry: Secondary | ICD-10-CM | POA: Insufficient documentation

## 2024-04-04 NOTE — Telephone Encounter (Signed)
 Pharmacy Patient Advocate Encounter   Received notification from [employer] CMM KEY that prior authorization for Trulicity  0.75MG /0.5ML auto-injectors is required/requested.   Insurance verification completed.   The patient is insured through [employer].   Per test claim: PA required; PA submitted to above mentioned insurance via Latent Key/confirmation #/EOC [id number] Status is pending

## 2024-04-04 NOTE — Telephone Encounter (Signed)
 Left message for patient asking to give our office a call back to discuss recent lab results and recommendations per Dr Abbey.   OK for E2C2 to give note if patient calls back. If relayed, please notify the office.

## 2024-04-04 NOTE — Telephone Encounter (Signed)
 Noted.

## 2024-04-04 NOTE — Telephone Encounter (Signed)
 Copied from CRM #[id number]. Topic: General - Other >> Apr 04, 2024  1:59 PM Rea C wrote: Reason for CRM:  Patient called back. Relayed msg from CMA Destiny.

## 2024-04-04 NOTE — Telephone Encounter (Signed)
 Please call the patient to let her know:   Update on elevated parathyroid  hormone level:  ENT reviewed your labs and recommends holding off on an ENT evaluation for now.  I also spoke with the endocrinologist youre scheduled to see in April; Dr. Mercie recommends that you see him first to further evaluate the elevated thyroid  hormone levels.  Plan: no changes at this time--please keep your endocrinology appointment on June 18, 2024. We can also discuss this in more detail at your upcoming visit on April 29, 2024.  Thank you,  Luke Shade, MD

## 2024-04-05 ENCOUNTER — Other Ambulatory Visit (HOSPITAL_COMMUNITY): Payer: Self-pay

## 2024-04-05 NOTE — Telephone Encounter (Signed)
 Pharmacy Patient Advocate Encounter  Received notification from [facility] that Prior Authorization for Trulicity  0.75MG /0.5ML auto-injectors has been APPROVED from 03/05/2024 to 04/04/2025. Unable to obtain price due to refill too soon rejection, last fill date 04/04/2024 next available fill date02/19/2026.   PA #/Case ID/Reference #: [id number]

## 2024-04-12 NOTE — Telephone Encounter (Signed)
 Spoke with patient to clarify her concerns and questions in regards to referral to ENT. Patient states she was informed by someone that she did not need the ENT referral. Patient was informed that [facility] did not manage Parathyroid  Hormone. Patient states she would like a referral placed for a [facility] location in [place] as it would be closer to her.

## 2024-04-12 NOTE — Telephone Encounter (Signed)
 Please let the patient know:  We will have to wait for patient to be evaluated by endocrinologist before ENT evaluation.  If future ENT referral is needed will refer her to [facility] at [facility] in future.  Luke Shade, MD

## 2024-04-26 ENCOUNTER — Inpatient Hospital Stay

## 2024-04-26 ENCOUNTER — Ambulatory Visit: Admitting: Hematology & Oncology

## 2024-04-29 ENCOUNTER — Ambulatory Visit

## 2024-04-29 ENCOUNTER — Encounter

## 2024-05-30 ENCOUNTER — Encounter: Admitting: Family

## 2024-06-03 ENCOUNTER — Ambulatory Visit: Admitting: Urology

## 2024-06-05 ENCOUNTER — Ambulatory Visit: Admitting: Podiatry

## 2024-06-18 ENCOUNTER — Ambulatory Visit: Admitting: Endocrinology
